# Patient Record
Sex: Female | Born: 1935
Health system: Southern US, Community
[De-identification: ages and names within clinical notes are randomized; demographics above are authoritative.]

## PROBLEM LIST (undated history)

## (undated) DIAGNOSIS — I10 Essential (primary) hypertension: Secondary | ICD-10-CM

## (undated) DIAGNOSIS — E119 Type 2 diabetes mellitus without complications: Secondary | ICD-10-CM

## (undated) DIAGNOSIS — M1711 Unilateral primary osteoarthritis, right knee: Secondary | ICD-10-CM

## (undated) DIAGNOSIS — K219 Gastro-esophageal reflux disease without esophagitis: Secondary | ICD-10-CM

## (undated) HISTORY — PX: BACK SURGERY: SHX140

## (undated) HISTORY — PX: JOINT REPLACEMENT: SHX530

## (undated) HISTORY — DX: Gastro-esophageal reflux disease without esophagitis: K21.9

## (undated) HISTORY — DX: Unilateral primary osteoarthritis, right knee: M17.11

## (undated) HISTORY — DX: Essential (primary) hypertension: I10

---

## 2005-01-19 LAB — HM COLONOSCOPY: HM Colonoscopy: NORMAL

## 2005-12-20 ENCOUNTER — Ambulatory Visit: Payer: Self-pay | Admitting: Gastroenterology

## 2007-02-05 ENCOUNTER — Emergency Department (HOSPITAL_COMMUNITY): Admission: EM | Admit: 2007-02-05 | Discharge: 2007-02-05 | Payer: Self-pay | Admitting: Emergency Medicine

## 2007-06-07 ENCOUNTER — Ambulatory Visit: Payer: Self-pay | Admitting: Family Medicine

## 2010-11-10 ENCOUNTER — Emergency Department: Payer: Self-pay | Admitting: Emergency Medicine

## 2010-12-17 ENCOUNTER — Ambulatory Visit: Payer: Self-pay | Admitting: Orthopedic Surgery

## 2010-12-27 ENCOUNTER — Emergency Department (HOSPITAL_COMMUNITY): Payer: MEDICARE

## 2010-12-27 ENCOUNTER — Encounter (HOSPITAL_COMMUNITY): Payer: Self-pay | Admitting: Radiology

## 2010-12-27 ENCOUNTER — Ambulatory Visit (HOSPITAL_COMMUNITY): Payer: MEDICARE

## 2010-12-27 ENCOUNTER — Emergency Department (HOSPITAL_COMMUNITY)
Admission: EM | Admit: 2010-12-27 | Discharge: 2010-12-27 | Disposition: A | Discharge status: Other Acute Inpatient Hospital | Payer: MEDICARE | Attending: Emergency Medicine | Admitting: Emergency Medicine

## 2010-12-27 ENCOUNTER — Inpatient Hospital Stay (HOSPITAL_COMMUNITY): Payer: Medicare Other

## 2010-12-27 ENCOUNTER — Inpatient Hospital Stay (HOSPITAL_COMMUNITY)
Admission: EM | Admit: 2010-12-27 | Discharge: 2010-12-30 | DRG: 471 | Disposition: A | Discharge status: Rehabilitation Facility | Payer: Medicare Other | Source: Ambulatory Visit | Attending: Neurological Surgery | Admitting: Neurological Surgery

## 2010-12-27 DIAGNOSIS — E119 Type 2 diabetes mellitus without complications: Secondary | ICD-10-CM | POA: Insufficient documentation

## 2010-12-27 DIAGNOSIS — Z79899 Other long term (current) drug therapy: Secondary | ICD-10-CM | POA: Insufficient documentation

## 2010-12-27 DIAGNOSIS — M5000 Cervical disc disorder with myelopathy, unspecified cervical region: Secondary | ICD-10-CM | POA: Diagnosis present

## 2010-12-27 DIAGNOSIS — I10 Essential (primary) hypertension: Secondary | ICD-10-CM | POA: Diagnosis present

## 2010-12-27 DIAGNOSIS — R5383 Other fatigue: Secondary | ICD-10-CM | POA: Insufficient documentation

## 2010-12-27 DIAGNOSIS — R209 Unspecified disturbances of skin sensation: Secondary | ICD-10-CM | POA: Insufficient documentation

## 2010-12-27 DIAGNOSIS — Y929 Unspecified place or not applicable: Secondary | ICD-10-CM | POA: Insufficient documentation

## 2010-12-27 DIAGNOSIS — M542 Cervicalgia: Secondary | ICD-10-CM | POA: Insufficient documentation

## 2010-12-27 DIAGNOSIS — M4712 Other spondylosis with myelopathy, cervical region: Principal | ICD-10-CM | POA: Diagnosis present

## 2010-12-27 DIAGNOSIS — R5381 Other malaise: Secondary | ICD-10-CM | POA: Insufficient documentation

## 2010-12-27 DIAGNOSIS — S13101A Dislocation of unspecified cervical vertebrae, initial encounter: Secondary | ICD-10-CM | POA: Insufficient documentation

## 2010-12-27 DIAGNOSIS — G8250 Quadriplegia, unspecified: Secondary | ICD-10-CM | POA: Diagnosis present

## 2010-12-27 DIAGNOSIS — W19XXXA Unspecified fall, initial encounter: Secondary | ICD-10-CM | POA: Insufficient documentation

## 2010-12-27 LAB — URINALYSIS, ROUTINE W REFLEX MICROSCOPIC
Bilirubin Urine: NEGATIVE
Nitrite: NEGATIVE
Specific Gravity, Urine: 1.011 (ref 1.005–1.030)
Urobilinogen, UA: 1 mg/dL (ref 0.0–1.0)

## 2010-12-27 LAB — POCT I-STAT, CHEM 8
Glucose, Bld: 122 mg/dL — ABNORMAL HIGH (ref 70–99)
HCT: 40 % (ref 36.0–46.0)
Hemoglobin: 13.6 g/dL (ref 12.0–15.0)
Potassium: 3.9 mEq/L (ref 3.5–5.1)
Sodium: 141 mEq/L (ref 135–145)

## 2010-12-27 LAB — GLUCOSE, CAPILLARY: Glucose-Capillary: 127 mg/dL — ABNORMAL HIGH (ref 70–99)

## 2010-12-28 ENCOUNTER — Inpatient Hospital Stay (HOSPITAL_COMMUNITY): Payer: Medicare Other

## 2010-12-28 HISTORY — PX: CERVICAL DISC SURGERY: SHX588

## 2010-12-28 LAB — URINE MICROSCOPIC-ADD ON

## 2010-12-28 LAB — APTT: aPTT: 32 seconds (ref 24–37)

## 2010-12-28 LAB — GLUCOSE, CAPILLARY
Glucose-Capillary: 142 mg/dL — ABNORMAL HIGH (ref 70–99)
Glucose-Capillary: 204 mg/dL — ABNORMAL HIGH (ref 70–99)

## 2010-12-28 LAB — URINALYSIS, ROUTINE W REFLEX MICROSCOPIC
Nitrite: NEGATIVE
Specific Gravity, Urine: 1.021 (ref 1.005–1.030)
Urobilinogen, UA: 1 mg/dL (ref 0.0–1.0)

## 2010-12-28 LAB — MRSA PCR SCREENING: MRSA by PCR: NEGATIVE

## 2010-12-29 DIAGNOSIS — M4712 Other spondylosis with myelopathy, cervical region: Secondary | ICD-10-CM

## 2010-12-29 LAB — GLUCOSE, CAPILLARY
Glucose-Capillary: 156 mg/dL — ABNORMAL HIGH (ref 70–99)
Glucose-Capillary: 161 mg/dL — ABNORMAL HIGH (ref 70–99)
Glucose-Capillary: 145 mg/dL — ABNORMAL HIGH (ref 70–99)

## 2010-12-30 ENCOUNTER — Inpatient Hospital Stay (HOSPITAL_COMMUNITY)
Admission: RE | Admit: 2010-12-30 | Discharge: 2011-01-08 | DRG: 945 | Disposition: A | Discharge status: Home Health | Payer: MEDICARE | Source: Other Acute Inpatient Hospital | Attending: Physical Medicine & Rehabilitation | Admitting: Physical Medicine & Rehabilitation

## 2010-12-30 DIAGNOSIS — Z981 Arthrodesis status: Secondary | ICD-10-CM

## 2010-12-30 DIAGNOSIS — B964 Proteus (mirabilis) (morganii) as the cause of diseases classified elsewhere: Secondary | ICD-10-CM | POA: Diagnosis present

## 2010-12-30 DIAGNOSIS — M4712 Other spondylosis with myelopathy, cervical region: Secondary | ICD-10-CM | POA: Diagnosis present

## 2010-12-30 DIAGNOSIS — E119 Type 2 diabetes mellitus without complications: Secondary | ICD-10-CM | POA: Diagnosis present

## 2010-12-30 DIAGNOSIS — Z5189 Encounter for other specified aftercare: Principal | ICD-10-CM

## 2010-12-30 DIAGNOSIS — M5000 Cervical disc disorder with myelopathy, unspecified cervical region: Secondary | ICD-10-CM | POA: Diagnosis present

## 2010-12-30 DIAGNOSIS — I10 Essential (primary) hypertension: Secondary | ICD-10-CM | POA: Diagnosis present

## 2010-12-30 DIAGNOSIS — N390 Urinary tract infection, site not specified: Secondary | ICD-10-CM | POA: Diagnosis present

## 2010-12-30 LAB — URINE CULTURE

## 2010-12-30 LAB — GLUCOSE, CAPILLARY: Glucose-Capillary: 179 mg/dL — ABNORMAL HIGH (ref 70–99)

## 2010-12-31 DIAGNOSIS — M1710 Unilateral primary osteoarthritis, unspecified knee: Secondary | ICD-10-CM

## 2010-12-31 DIAGNOSIS — M4712 Other spondylosis with myelopathy, cervical region: Secondary | ICD-10-CM

## 2010-12-31 LAB — COMPREHENSIVE METABOLIC PANEL
ALT: 19 U/L (ref 0–35)
Alkaline Phosphatase: 61 U/L (ref 39–117)
CO2: 22 mEq/L (ref 19–32)
Chloride: 108 mEq/L (ref 96–112)
GFR calc non Af Amer: >60 mL/min (ref >60)
Glucose, Bld: 126 mg/dL — ABNORMAL HIGH (ref 70–99)
Potassium: 4.1 mEq/L (ref 3.5–5.1)
Sodium: 138 mEq/L (ref 135–145)
Total Protein: 6.6 g/dL (ref 6.0–8.3)

## 2010-12-31 LAB — GLUCOSE, CAPILLARY
Glucose-Capillary: 120 mg/dL — ABNORMAL HIGH (ref 70–99)
Glucose-Capillary: 118 mg/dL — ABNORMAL HIGH (ref 70–99)

## 2010-12-31 LAB — DIFFERENTIAL
Basophils Absolute: 0 10*3/uL (ref 0.0–0.1)
Lymphocytes Relative: 46 % (ref 12–46)
Neutro Abs: 2.9 10*3/uL (ref 1.7–7.7)

## 2010-12-31 LAB — CBC
HCT: 35.8 % — ABNORMAL LOW (ref 36.0–46.0)
Hemoglobin: 11.7 g/dL — ABNORMAL LOW (ref 12.0–15.0)
RBC: 4.3 MIL/uL (ref 3.87–5.11)
WBC: 6.8 10*3/uL (ref 4.0–10.5)

## 2011-01-01 LAB — GLUCOSE, CAPILLARY
Glucose-Capillary: 125 mg/dL — ABNORMAL HIGH (ref 70–99)
Glucose-Capillary: 134 mg/dL — ABNORMAL HIGH (ref 70–99)
Glucose-Capillary: 103 mg/dL — ABNORMAL HIGH (ref 70–99)

## 2011-01-02 LAB — GLUCOSE, CAPILLARY
Glucose-Capillary: 79 mg/dL (ref 70–99)
Glucose-Capillary: 178 mg/dL — ABNORMAL HIGH (ref 70–99)

## 2011-01-02 NOTE — H&P (Addendum)
NAMEFRAIDY, MCCARRICK NO.:  [id number]  MEDICAL RECORD NO.:  [id number]           PATIENT TYPE:  I  LOCATION:  4035                         FACILITY:  [facility]  PHYSICIAN:  Ranelle Oyster, M.D.DATE OF BIRTH:  1932/05/13  DATE OF ADMISSION:  12/30/2010 DATE OF DISCHARGE:                             HISTORY & PHYSICAL   REASON FOR ADMISSION:  Weakness.  HISTORY:  A 77 year old female who was previously independent, had a fall in December 2011 with progressive weakness progressing to the arms and legs.  She was admitted on December 27, 2010 after an MRI of the C- spine showed a C3-4 stenosis and myelopathy due to spondylosis and herniated nucleus pulposus.  She underwent anterior cervical decompression and fusion at C3-4 level on December 28, 2010, per Dr. Barnett Abu.  She was placed in a soft collar postoperatively.  She was placed on a Decadron protocol, which was completed.  A urinalysis and culture showed greater than 100,000 Proteus mirabilis and placed on Cipro on December 29, 2010.  She was started in physical therapy and occupational therapy.  Had ongoing deficits and felt to be a good rehab candidate.  Therefore, PM and R was consulted and decision was made to admit the patient to inpatient rehabilitation.  REVIEW OF SYSTEMS:  Positive for weakness and numbness, numbness is more in the hand, weakness is in the hands as well as of the right greater than left lower extremity.  PAST HISTORY:  Diabetes and hypertension.  HABITS:  Negative EtOH, negative tobacco.  FAMILY HISTORY:  Positive for diabetes mellitus.  SOCIAL HISTORY:  Living alone until December 2011 until recent fall, then stays with her daughter and son-in-law who can assist as needed. One level home, two steps to enter.  FUNCTIONAL HISTORY:  Independent prior to December 2011 fall.  HOME MEDICATIONS: 1. Losartan 50 mg a day. 2. Metformin 500 mg b.i.d. 3. Tramadol 50 mg b.i.d. 4.  Meloxicam 7.5 daily. 5. Glipizide 5 mg daily.  ALLERGIES:  ASPIRIN.  RECENT LABORATORY DATA:  Hemoglobin of 13.6.  BUN 15, creatinine 0.7. Sodium 141, potassium 3.9.  PHYSICAL EXAMINATION:  VITAL SIGNS:  Blood pressure 152/81, pulse 56, respirations 20, temperature 97.4. GENERAL:  This is a well-developed and well-nourished female in no acute distress.  She has A cervical collar.  She has a healing incision.  No evidence of drainage, erythema.  Mild amount of soft tissue swelling. EYES:  Anicteric, noninjected. EXTERNAL ENT:  Normal. LUNGS:  Respiratory effort is good.  Lungs are clear. HEART:  Regular rate and rhythm.  No rubs, murmurs or extra sounds. ABDOMEN:  Positive bowel sounds, soft, nontender to palpation. NEUROLOGIC:  Gait has not tested given that there is no assistive device in the room.  She has reduced sitting balance.  She has dysmetria on finger-nose-finger testing as well as heel-to-shin testing bilaterally. Her motor strength is 4-/5 bilateral deltoid, biceps, triceps and 3- bilateral finger flexors, extensors.  In the lower extremities, she has 3-/5 strength in the right hip flexors, knee extensors and ankle dorsiflexor.  On the left side, it is 4/5 in these  same muscle groups. Sensation is diminished bilateral upper extremity below the distal to the wrist and intact in the lower extremities.  Deep tendon reflexes are hyperreflexic bilateral upper and lower extremities.  POST ADMISSION PHYSICIAN EVALUATION: 1. Functional deficits secondary to cervical spondylosis, stenosis     resulting in myelopathy after a fall, status post C3-4     decompression and fusion on postop day #2. 2. The patient was admitted to receive collaborative interdisciplinary     care between the podiatrist, rehab nursing staff, and therapy team. 3. The patient's level of medical complexity and substantial therapy     needs in context of that medical necessity cannot be provided at a      lesser intensity of care. 4. The patient has experienced substantial functional loss from her     baseline.  Upon functional assessment at the time of preadmission     screening, the patient had PT and OT evaluations pending.     Currently, the patient is at a min-assist level with mobility,     ambulating 15 feet with a rolling walker, min-assist.  She is min-     assist lower body dressing, min-to-guard upper body dressing.     Judging by the patient's diagnosis, physical exam, and functional     history, the patient had displayed the ability to make functional     progress which will result in measurable gains while on inpatient     rehab.  These gains will be of substantial and practical use upon     discharge to home in facilitating mobility, self-care and     independence.  Interim changes in medical status since preadmission     screening are detailed in the history of present illness. 5. Physiatrist will provide 24-hour management of medical needs as     well as oversight of the therapy plan/treatment and provide     guidance as appropriate regarding the interaction of the two. 6. A 24-hour rehab nursing will assess in management of the skin,     bowel and bladder and help to integrate therapy, concepts,     techniques, education. 7. PT will assess and treat for pre-gait training, gait training,     endurance, equipment, safety, goals are for modified independent     level with all mobility. 8. OT will assess and treat for ADLs, neuromuscular reeducation,     safety endurance, equipment, goals are for modified independent     level with upper body and lower body ADLs. 9. Case management and social work will assess and treat for     psychosocial issues and discharge planning. 10.Team conference will be held weekly to assess the patient     progress/goals and to determine barriers to discharge. 11.The patient demonstrated sufficient medical stability and exercise     capacity to  tolerate at least 3 hours of therapy per day at least 5     days per week. 12.Estimated length of stay is 7-10 days.  Prognosis for further     functional improvement is good.  MEDICAL PROBLEM LIST AND PLAN: 1. Urinary tract infection.  Continue Cipro. 2. Question neurogenic bladder.  We will check bladder volume     indicator as postvoid residual. 3. Hypertension.  Continue Cozaar. 4. Noninsulin-dependent diabetes mellitus.  Continue Glucophage 500     b.i.d. 5. Pain management, on Vicodin. 6. Deep venous thrombosis prophylax, on thigh high TEDs and mobilize     aggressively. 7. Question neurogenic  bowel.  We will monitor and may need to     institute bowel program.  Discussed Rehab Medicine Services with     the patient and questions were answered.  The patient appears to have adequate motivation and mood for full participation in comprehensive intensive inpatient rehabilitation program.     Erick Colace, M.D.   ______________________________ Ranelle Oyster, M.D.    AEK/MEDQ  D:  12/30/2010  T:  12/31/2010  Job:  [id number]  cc:   Dr. Halina Maidens, M.D.  Electronically Signed by Claudette Laws M.D. on 12/31/2010 04:29:36 PM Electronically Signed by Faith Rogue M.D. on 01/05/2011 10:06:53 AM

## 2011-01-03 LAB — GLUCOSE, CAPILLARY
Glucose-Capillary: 107 mg/dL — ABNORMAL HIGH (ref 70–99)
Glucose-Capillary: 121 mg/dL — ABNORMAL HIGH (ref 70–99)

## 2011-01-04 LAB — GLUCOSE, CAPILLARY: Glucose-Capillary: 145 mg/dL — ABNORMAL HIGH (ref 70–99)

## 2011-01-05 DIAGNOSIS — M4712 Other spondylosis with myelopathy, cervical region: Secondary | ICD-10-CM

## 2011-01-05 LAB — GLUCOSE, CAPILLARY
Glucose-Capillary: 123 mg/dL — ABNORMAL HIGH (ref 70–99)
Glucose-Capillary: 149 mg/dL — ABNORMAL HIGH (ref 70–99)

## 2011-01-06 DIAGNOSIS — M4712 Other spondylosis with myelopathy, cervical region: Secondary | ICD-10-CM

## 2011-01-06 LAB — GLUCOSE, CAPILLARY: Glucose-Capillary: 83 mg/dL (ref 70–99)

## 2011-01-07 LAB — GLUCOSE, CAPILLARY
Glucose-Capillary: 51 mg/dL — ABNORMAL LOW (ref 70–99)
Glucose-Capillary: 90 mg/dL (ref 70–99)

## 2011-01-08 LAB — GLUCOSE, CAPILLARY: Glucose-Capillary: 124 mg/dL — ABNORMAL HIGH (ref 70–99)

## 2011-01-13 NOTE — H&P (Signed)
NAMEAIKO, BELKO               ACCOUNT NO.:  [id number]  MEDICAL RECORD NO.:  [id number]           PATIENT TYPE:  I  LOCATION:  3008                         FACILITY:  [facility]  PHYSICIAN:  Stefani Dama, M.D.  DATE OF BIRTH:  23-Jul-1932  DATE OF ADMISSION:  12/27/2010 DATE OF DISCHARGE:                             HISTORY & PHYSICAL   ADMITTING DIAGNOSES:  C3-C4 spondylosis with herniated nucleus pulposus, cord compression, myelopathy, and quadriparesis.  HISTORY OF PRESENT ILLNESS:  Ms. Ranie Chinchilla is a 77 year old right- handed individual who tells me that she sustained a fall around Christmas Day this past month.  She complained of feeling weak in her legs and had numbness in her knee.  She was seen in the [facility] Emergency Room, taken there by her daughter and she was told that she had arthritis in her knees but the numbness in her hands was not specifically addressed.  The patient notes that this problem continued and for about the past 2 weeks she has become so severely weak in her legs that she has not been able to walk.  She has been so weak in her hands that she cannot even feed herself.  Her daughter had brought her mother from [place] where __________ [place] with the daughter lives, so she could better care for her.  She notes that she has required use of Depends.  She cannot get up to go the bathroom.  Today she is brought to the emergency room for further workup and in the emergency room, a CT scan of the brain was performed.  An MRI of the cervical spine was performed and this demonstrates presence of a large cervical herniated nucleus pulposus and on top of it significantly spondylitic spinal canal.  The patient has evidence of cord changes within the spinal canal.  Her past medical history reveals her general health has been quite fair. She does have an underlying history of diabetes.  Her primary care physician is in [place].  She is currently  managed with some losartan 50 mg a day, Mobic for arthritis, metformin 500 mg twice daily, glipizide 5 mg a day, and Ultram for pain.  She does not notice any allergies.  She is followed by a local physician in the [facility] region.  Her social history reveals that she lives alone.  Her physical examination reveals that she is alert and oriented.  Her blood pressure on initial examination is noted to 180/81, heart rate 98, respirations are 18.  She is alert and oriented.  She complains of pain and weakness in her upper extremities.  She has proximal strength in the deltoids, biceps, and triceps all graded at 3/5.  Wrist extensor strength is also graded at 3/5 and grip strength is only graded at 3/5. She has difficulty with fine manipulative control of the hand.  She had marked intrinsic weakness bilaterally.  She has only 3/5 strength in iliopsoas, quad, tibialis anterior, and the gastrocs and lower extremities.  She has great deal of difficulty lifting against gravity with her quadriceps to extend either distal lower extremity.  She has good sensation present  to light pin and touch in the distal lower extremities.  She has some moderate decrease in sensation in hands.  She is somewhat hyperpathic in the arms and particularly in the forearms. General physical exam reveals her heart has regular rate and rhythm.  No murmurs are noted.  Lungs are clear to auscultation.  The abdomen is soft.  Bowel sounds are positive.  No masses are noted.  Extremities reveal no cyanosis, clubbing, or edema.  Deep tendon reflexes are absent in the patellae and the Achilles both.  They are trace in the biceps, absent in the triceps.  IMPRESSION:  The patient has evidence of severe spondylosis at C3-C4 with a central herniation of the disk causing cord compression.  She is now severely myelopathic.  I advised that the patient be started on steroid medication.  I had discussed surgical intervention and  decompressed C3- C4.  I noted that the patient will be transferred over to [facility].  This will hopefully happen this evening, so that we see her and follow her up while at [facility].     Stefani Dama, M.D.     Merla Riches  D:  12/27/2010  T:  12/28/2010  Job:  [id number]  Electronically Signed by Barnett Abu M.D. on 01/13/2011 08:03:01 AM

## 2011-01-13 NOTE — Op Note (Signed)
[name], [name]               ACCOUNT NO.:  [id number]  MEDICAL RECORD NO.:  [id number]           PATIENT TYPE:  I  LOCATION:  3008                         FACILITY:  [facility]  PHYSICIAN:  Stefani Dama, M.D.  DATE OF BIRTH:  08/11/1932  DATE OF PROCEDURE:  12/28/2010 DATE OF DISCHARGE:                              OPERATIVE REPORT   PREOPERATIVE DIAGNOSES:  C3, C4 spondylosis plus herniated nucleus pulposus with quadriparesis.  POSTOPERATIVE DIAGNOSES:  C3, C4 spondylosis plus herniated nucleus pulposus with quadriparesis.  PROCEDURES: 1. Anterior cervical decompression, C3-C4. 2. Arthrodesis with structural allograft, Alphatec plate fixation.  SURGEON:  Stefani Dama, MD  FIRST ASSISTANT:  Cristi Loron, MD  ANESTHESIA:  General endotracheal.  INDICATIONS:  [name] is a 77 year old individual who has had significant problems with quadriparesis that has been quite severe such that she could not feed herself nor could she walk independently.  This has been going on for several weeks' time.  She came to the emergency room where an MRI of the neck was performed and this demonstrated presence of a large herniated nucleus pulposus on top of a significantly spondylitic neck at C3-C4 causing cord compression with edema.  She has been advised regarding need for urgent surgical decompression.  PROCEDURE IN DETAIL:  The patient was brought to the operating room and placed on the table in supine position.  After smooth induction of general endotracheal anesthesia, her neck was placed in 5 pounds of Holter traction and was prepped with alcohol and DuraPrep and draped in sterile fashion.  A transverse incision was created and carried down to the cervical dorsal fascia in the plane between the sternocleidomastoid and strap muscle was dissected bluntly until the prevertebral space was reached.  The first identifiable disk space was noted to be that of C3- C4.   Self-retaining Caspar retractors were then placed in the wound and the disk space was opened with #15 blade.  A combination of Kerrison rongeurs was used to evacuate a significant quantity of severely degenerating and desiccated disk material.  In the region of posterior longitudinal ligament, there was noted to be free disk material bowing the posterior longitudinal ligament back into the spinal canal.  This was decompressed and then the posterior ligament was opened using a 2-mm Kerrison punch.  The opening was carried out to either lateral recess. Lateral recesses were decompressed using a high-speed drill to remove the bony osteophytes from the uncinate processes, and then a 2-mm Kerrison punch was used to remove osteophytosis from the inferior margin of body of C3 and the superior margin body of C4.  Once decompression was completed, hemostasis was achieved meticulously and then a 7-mm trans graft was shaped to the appropriate size and configuration fit in the interspace and filled with demineralized bone matrix.  Traction was removed at this point and the lateral aspects of the graft were filled with graft material also.  A 14-mm standard size Alphatec plate was fitted in the ventral aspect of the vertebral bodies. This was secured with 12-mm screws.  Final radiograph identified good fixation and good position of the  hardware, and then the platysma was closed with 3-0 Vicryl and the subcuticular tissue was closed with 3-0 Vicryl also.  Blood loss was minimal.  The patient tolerated the procedure well.     Stefani Dama, M.D.     Merla Riches  D:  12/28/2010  T:  12/29/2010  Job:  [id number]  Electronically Signed by Barnett Abu M.D. on 01/13/2011 08:03:24 AM

## 2011-01-13 NOTE — Consult Note (Signed)
  NAMETAVIE, HASEMAN NO.:  [id number]  MEDICAL RECORD NO.:  [id number]           PATIENT TYPE:  I  LOCATION:  3008                         FACILITY:  [facility]  PHYSICIAN:  Stefani Dama, M.D.  DATE OF BIRTH:  [birth year]  DATE OF CONSULTATION:  12/27/2010 DATE OF DISCHARGE:                                CONSULTATION   REFERRING PHYSICIAN:  Orlene Och, MD  REASON FOR REQUEST:  Quadriparesis.  HISTORY OF PRESENT ILLNESS:  [name] is a 77 year old lady who has been getting progressively weaker for at least in the last 4 weeks' time and perhaps more severely over the last 2 weeks' time.  She is noted to be quadriparetic, having 3/5 strength in her arms and her legs. An MRI today demonstrated presence of severe spondylosis at C3-C4 with cord compression secondary to a central disk herniation.  After seeing and evaluating her briefly, I advised that she will definitely need surgical decompression because of the severe recent deterioration in light of the fact that it has been at least 2 weeks.  As the patient has been substantially weak, I indicated that there is no guarantee that she would get much improvement if any with surgical intervention. Nonetheless, at this time, this is a dire situation and I believe that surgical decompression should be considered.  She is to be transferred to [facility] at this time for definitive surgical care.     Stefani Dama, M.D.     Merla Riches  D:  12/27/2010  T:  12/28/2010  Job:  [id number]  Electronically Signed by Barnett Abu M.D. on 01/13/2011 08:03:10 AM

## 2011-02-02 NOTE — Discharge Summary (Signed)
[name], [name]               ACCOUNT NO.:  [id number]  MEDICAL RECORD NO.:  [id number]           PATIENT TYPE:  I  LOCATION:  4035                         FACILITY:  [facility]  PHYSICIAN:  Ranelle Oyster, M.D.DATE OF BIRTH:  1932/08/02  DATE OF ADMISSION:  12/30/2010 DATE OF DISCHARGE:  01/08/2011                              DISCHARGE SUMMARY   DISCHARGE DIAGNOSES: 1. Cervical spondylosis stenosis with myelopathy status post cervical     3-4 decompression and fusion on December 28, 2010. 2. Pain management. 3. Hypertension. 4. Non-insulin-dependent diabetes mellitus. 5. Proteus urinary tract infection.  This is a 77 year old right-handed black female with history of fall in December 2011, with progressive weakness to bilateral arms and legs, admitted on December 27, 2010, with MRI of cervical spine showing cervical 3-4 spondylosis stenosis with myelopathy plus HNP.  Underwent anterior cervical decompression and fusion, cervical 3-4 on December 28, 2010, per Dr. Danielle Dess.  Soft collar placed as needed for comfort. Decadron taper completed.  She was placed on Cipro on December 29, 2010, for a Proteus urinary tract infection.  She is minimal assist for mobility.  She was admitted for comprehensive rehab program.  PAST MEDICAL HISTORY:  See discharge diagnoses.  No alcohol or tobacco.  ALLERGIES:  ASPIRIN.  SOCIAL HISTORY:  Had been living alone until December 2011, until recent falls, now staying with her daughter and son-in-law with assistance as needed.  They live in a 1-level home with 2 steps to entry.  Functional history prior to admission was independent prior to December 2011. Functional status upon admission to rehab services was minimal assist bed mobility and transfers, minimal assist to ambulate with a rolling walker, minimal assist activities of daily living.  MEDICATIONS PRIOR TO ADMISSION: 1. Losartan 50 mg daily. 2. Glucophage 500 mg twice daily. 3.  Tramadol as needed. 4. Meloxicam 7.5 mg daily. 5. Glipizide 5 mg daily.  PHYSICAL EXAMINATION:  VITAL SIGNS:  Blood pressure 152/81, pulse 56, temperature 97.4, respirations 20. NEUROLOGIC:  This was an alert female in no acute distress, oriented x3. Deep tendon reflexes 2+.  She was mildly ataxic.  Sensation decreased at right hand.  Surgical site clean and dry. LUNGS:  Clear to auscultation. CARDIAC:  Regular rate and rhythm. ABDOMEN:  Soft, nontender.  Good bowel sounds.  REHABILITATION HOSPITAL COURSE:  The patient was admitted to inpatient rehab services with therapies initiated on a 3-hour daily basis consisting of physical therapy, occupational therapy, and rehabilitation nursing.  The following issues were addressed during the patient's rehabilitation stay.  Pertaining to Ms. Shabazz's cervical spondylosis stenosis with myelopathy, she had undergone decompression and fusion on December 28, 2010, per Dr. Barnett Abu.  She was wearing a cervical collar for comfort.  She was maintained on Vicodin for pain management with good results.  She had been fitted with a hinged left knee brace to stabilize her knee that would buccal at times per [facility]. Her blood pressures remained controlled on Cozaar with no orthostatic changes.  Blood sugars with good control on Glucophage.  She had completed a course of Cipro for a Proteus urinary tract  infection.  She had no dysuria or hematuria.  The patient received weekly collaborative interdisciplinary team conferences to discuss estimated length of stay, family teaching, and any barriers to her discharge.  She was ambulating household functional distances with a walker, needing very little assist for activities of daily living.  Strength and endurance had greatly improved.  She was encouraged for overall progress.  Plans will be discharge to home with assistance as needed.  Latest labs showed a hemoglobin 11.7, hematocrit 35.8,  platelet 277,000. Sodium 138, potassium 4.1, BUN 22, creatinine 0.7.  Discharge medications at the time of dictation included: 1. Pepcid 20 mg twice daily. 2. Glipizide 5 mg daily. 3. Cozaar 50 mg daily. 4. Glucophage 500 mg twice daily. 5. Vicodin 5/325 one to two tablets every 4 hours as needed for pain,     dispense of 90 tablets.  DIET:  Diabetic diet.  SPECIAL INSTRUCTIONS:  Soft cervical collar as needed for comfort, left hinged knee brace when out of bed to ambulate to guard against her knee buckling.  Follow up Dr. Elizabeth Sauer, medical management in 2 weeks; Dr. Barnett Abu, [phone], Neurosurgery, call for appointment; Dr. Faith Rogue at the outpatient rehab service office as needed.     Mariam Dollar, P.A.   ______________________________ Ranelle Oyster, M.D.    DA/MEDQ  D:  01/06/2011  T:  01/06/2011  Job:  [id number]  cc:   Dr. Halina Maidens, M.D.  Electronically Signed by Mariam Dollar P.A. on 01/10/2011 06:21:29 AM Electronically Signed by Faith Rogue M.D. on 02/02/2011 10:20:29 AM

## 2011-03-07 NOTE — Discharge Summary (Signed)
  NAMEAMRIE, [name]:  [id number]  MEDICAL RECORD NO.:  [id number]           PATIENT TYPE:  LOCATION:                                 FACILITY:  PHYSICIAN:  Stefani Dama, M.D.  DATE OF BIRTH:  1932-08-26  DATE OF ADMISSION:  12/28/2010 DATE OF DISCHARGE:  12/30/2010                              DISCHARGE SUMMARY   ADMITTING DIAGNOSES:  C3-4 spondylosis, herniated nucleus pulposus with quadriparesis and cord compression.  DISCHARGE DIAGNOSIS:  C3-4 spondylosis, herniated nucleus pulposus with quadriparesis and cord compression.  OPERATION AND PROCEDURE:  Anterior cervical decompression and fusion at C3-4 with arthrodesis structural allograft and Arthrotek plate fixation.  HISTORY AND HOSPITAL COURSE:  [name] is a 77 year old female who has significant problems with quadriparesis, has been quite severe.  She cannot feed herself or walk independently.  This has been going on for several weeks time.  She came to the emergency room.  MRI resulted in a finding of a large herniated disk at the C3-4 level and a spondylitic neck causing cord compression and edema.  Advise on urgent surgical decompression.  The patient tolerated this procedure well.  On December 28, 2010, postoperatively, she was eating well, improved in her pain level.  She still had dysesthesias and weakness, requiring some rehabilitation.  Comprehensive inpatient rehab consult was obtained and felt she would be a good candidate.  She made good progress, comfortable on p.o. pain medicine, was ready for discharge to comprehensive inpatient rehabilitation.  Second day postoperatively, on December 30, 2010, eating well, voiding well, needing assistance, and rehab from occupational therapy and physical therapy standpoint without swallowing difficulties.  DISCHARGE CONDITION:  Stable, improved.  DISCHARGE INSTRUCTIONS:  Discharged to comprehensive inpatient rehabilitation.  Continue on  home medications of tramadol, glipizide, metformin, losartan, and hold on meloxicam.  She is to take Percocet and/or Valium for pain control.  She will be followed as an inpatient and as an outpatient per routine after she is discharged, more hopeful that she will get a good result after comprehensive inpatient rehabilitation get it to the point where she can feed' herself independently and ambulate safely.  All questions encouraged and stressed.  The patient is discharged to comprehensive inpatient rehab. Condition on discharge, improved.     [name] Medico.   ______________________________ Stefani Dama, M.D.    SCI/MEDQ  D:  02/03/2011  T:  02/04/2011  Job:  [id number]  Electronically Signed by Orlin Hilding P.A. on 02/10/2011 09:11:43 AM Electronically Signed by Barnett Abu M.D. on 03/07/2011 10:09:30 AM

## 2011-03-22 ENCOUNTER — Encounter (HOSPITAL_COMMUNITY)
Admission: RE | Admit: 2011-03-22 | Discharge: 2011-03-22 | Disposition: A | Discharge status: Home / Self Care | Payer: Medicare Other | Source: Ambulatory Visit | Attending: Orthopedic Surgery | Admitting: Orthopedic Surgery

## 2011-03-22 LAB — CBC
Hemoglobin: 12.4 g/dL (ref 12.0–15.0)
MCH: 28.8 pg (ref 26.0–34.0)
MCHC: 33.4 g/dL (ref 30.0–36.0)
RDW: 13.5 % (ref 11.5–15.5)

## 2011-03-22 LAB — COMPREHENSIVE METABOLIC PANEL
AST: 16 U/L (ref 0–37)
Albumin: 3.4 g/dL — ABNORMAL LOW (ref 3.5–5.2)
Calcium: 10 mg/dL (ref 8.4–10.5)
Chloride: 103 mEq/L (ref 96–112)
Creatinine, Ser: 0.66 mg/dL (ref 0.4–1.2)
GFR calc Af Amer: >60 mL/min (ref >60)

## 2011-03-22 LAB — PROTIME-INR: Prothrombin Time: 13.4 seconds (ref 11.6–15.2)

## 2011-03-22 LAB — URINE MICROSCOPIC-ADD ON

## 2011-03-22 LAB — URINALYSIS, ROUTINE W REFLEX MICROSCOPIC
Glucose, UA: NEGATIVE mg/dL
Hgb urine dipstick: NEGATIVE
Protein, ur: NEGATIVE mg/dL

## 2011-03-22 LAB — DIFFERENTIAL
Basophils Relative: 1 % (ref 0–1)
Eosinophils Absolute: 0.2 10*3/uL (ref 0.0–0.7)
Eosinophils Relative: 3 % (ref 0–5)
Monocytes Absolute: 0.6 10*3/uL (ref 0.1–1.0)
Monocytes Relative: 8 % (ref 3–12)
Neutro Abs: 3.6 10*3/uL (ref 1.7–7.7)

## 2011-03-23 LAB — URINE CULTURE: Colony Count: 50000

## 2011-03-28 ENCOUNTER — Inpatient Hospital Stay (HOSPITAL_COMMUNITY)
Admission: RE | Admit: 2011-03-28 | Discharge: 2011-03-30 | DRG: 470 | Disposition: A | Discharge status: Home Health | Payer: Medicare Other | Source: Ambulatory Visit | Attending: Orthopedic Surgery | Admitting: Orthopedic Surgery

## 2011-03-28 DIAGNOSIS — K5900 Constipation, unspecified: Secondary | ICD-10-CM | POA: Diagnosis not present

## 2011-03-28 DIAGNOSIS — I10 Essential (primary) hypertension: Secondary | ICD-10-CM | POA: Diagnosis present

## 2011-03-28 DIAGNOSIS — D62 Acute posthemorrhagic anemia: Secondary | ICD-10-CM | POA: Diagnosis not present

## 2011-03-28 DIAGNOSIS — M129 Arthropathy, unspecified: Secondary | ICD-10-CM | POA: Diagnosis present

## 2011-03-28 DIAGNOSIS — E119 Type 2 diabetes mellitus without complications: Secondary | ICD-10-CM | POA: Diagnosis present

## 2011-03-28 DIAGNOSIS — M1710 Unilateral primary osteoarthritis, unspecified knee: Principal | ICD-10-CM | POA: Diagnosis present

## 2011-03-28 DIAGNOSIS — IMO0002 Reserved for concepts with insufficient information to code with codable children: Principal | ICD-10-CM | POA: Diagnosis present

## 2011-03-28 DIAGNOSIS — Z01812 Encounter for preprocedural laboratory examination: Secondary | ICD-10-CM

## 2011-03-28 HISTORY — PX: TOTAL KNEE ARTHROPLASTY: SHX125

## 2011-03-28 LAB — GLUCOSE, CAPILLARY
Glucose-Capillary: 126 mg/dL — ABNORMAL HIGH (ref 70–99)
Glucose-Capillary: 217 mg/dL — ABNORMAL HIGH (ref 70–99)
Glucose-Capillary: 166 mg/dL — ABNORMAL HIGH (ref 70–99)

## 2011-03-28 LAB — TYPE AND SCREEN
ABO/RH(D): O POS
Antibody Screen: NEGATIVE

## 2011-03-29 LAB — GLUCOSE, CAPILLARY
Glucose-Capillary: 134 mg/dL — ABNORMAL HIGH (ref 70–99)
Glucose-Capillary: 213 mg/dL — ABNORMAL HIGH (ref 70–99)
Glucose-Capillary: 171 mg/dL — ABNORMAL HIGH (ref 70–99)

## 2011-03-29 LAB — BASIC METABOLIC PANEL
Chloride: 102 mEq/L (ref 96–112)
Creatinine, Ser: 0.6 mg/dL (ref 0.4–1.2)
GFR calc Af Amer: >60 mL/min (ref >60)
GFR calc non Af Amer: >60 mL/min (ref >60)
Potassium: 4.2 mEq/L (ref 3.5–5.1)

## 2011-03-29 LAB — CBC
MCH: 29.1 pg (ref 26.0–34.0)
Platelets: 226 10*3/uL (ref 150–400)
RBC: 3.3 MIL/uL — ABNORMAL LOW (ref 3.87–5.11)
WBC: 8.1 10*3/uL (ref 4.0–10.5)

## 2011-03-30 LAB — BASIC METABOLIC PANEL
BUN: 9 mg/dL (ref 6–23)
Chloride: 100 mEq/L (ref 96–112)
Potassium: 4.4 mEq/L (ref 3.5–5.1)

## 2011-03-30 LAB — CBC
MCV: 85.5 fL (ref 78.0–100.0)
Platelets: 189 10*3/uL (ref 150–400)
RBC: 3.3 MIL/uL — ABNORMAL LOW (ref 3.87–5.11)
WBC: 9 10*3/uL (ref 4.0–10.5)

## 2011-03-30 LAB — GLUCOSE, CAPILLARY
Glucose-Capillary: 153 mg/dL — ABNORMAL HIGH (ref 70–99)
Glucose-Capillary: 127 mg/dL — ABNORMAL HIGH (ref 70–99)

## 2011-04-04 ENCOUNTER — Ambulatory Visit (HOSPITAL_COMMUNITY)
Admission: RE | Admit: 2011-04-04 | Discharge: 2011-04-04 | Disposition: A | Discharge status: Home / Self Care | Payer: Medicare Other | Source: Ambulatory Visit | Attending: Orthopedic Surgery | Admitting: Orthopedic Surgery

## 2011-04-04 DIAGNOSIS — M79609 Pain in unspecified limb: Secondary | ICD-10-CM | POA: Insufficient documentation

## 2011-04-04 DIAGNOSIS — M7989 Other specified soft tissue disorders: Secondary | ICD-10-CM | POA: Insufficient documentation

## 2011-04-12 NOTE — Op Note (Signed)
[name], [name]               ACCOUNT NO.:  [id number]  MEDICAL RECORD NO.:  [id number]           PATIENT TYPE:  I  LOCATION:  5009                         FACILITY:  [facility]  PHYSICIAN:  Azana Kiesler A. Thurston Hole, M.D. DATE OF BIRTH:  04/15/1932  DATE OF PROCEDURE:  03/28/2011 DATE OF DISCHARGE:                              OPERATIVE REPORT   PREOPERATIVE DIAGNOSIS:  Left knee degenerative joint disease.  POSTOPERATIVE DIAGNOSIS:  Left knee degenerative joint disease.  PROCEDURE:  Left total knee replacement using DePuy cemented total knee system with #3 cemented femur, #3 cemented tibia with 12.5-mm polyethylene RP tibial spacer and 32-mm polyethylene cemented patella.  SURGEON:  Elana Alm. Thurston Hole, MD  ASSISTANT:  Julien Girt, PA-C  ANESTHESIA:  General.  OPERATIVE TIME:  One hour and 30 minutes.  COMPLICATIONS:  None.  DESCRIPTION OF PROCEDURE:  [name] was brought to the operating room on Mar 28, 2011, placed on the operating table in supine position.  After an adequate level of general anesthesia was obtained, her left knee was examined.  Range of motion from minus 10-120 degrees, moderate varus deformity, knee stable ligamentous exam with normal patellar tracking. She had a Foley catheter placed under sterile conditions.  She received Ancef 2 g IV preoperatively for prophylaxis.  Left leg was then prepped using sterile DuraPrep and draped using sterile technique.  Time-out procedure was called, and the correct left knee identified.  Left leg was exsanguinated and then a thigh tourniquet elevated to 375 mm. Initially through a 15-cm longitudinal incision based over the patella, initial exposure was made.  The underlying subcutaneous tissues were incised along with skin incision.  A median arthrotomy was performed revealing an excessive amount of normal-appearing joint fluid.  The articular surfaces were inspected.  She had grade 4 changes medially, laterally, and  in the patellofemoral joint.  Osteophytes were removed from the femoral condyles and tibial plateau.  The medial and lateral meniscal remnants were removed as well as the anterior cruciate ligament.  Intramedullary drill was then drilled up the femoral canal for placement of proximal femoral cutting jig which was placed in the appropriate manner of rotation and a proximal 13-mm cut was made.  The distal femur was incised.  The #3 was found to be the appropriate size. The #3 cutting jig was placed in the appropriate manner of external rotation and then these cuts were made.  Proximal tibia was then exposed.  The tibial spines were removed with an oscillating saw. Intramedullary drill was drilled down the tibial canal for placement of the proximal tibial cutting jig which was placed in the appropriate manner of rotation and a proximal 4-mm cut was made based off the medial or lower side.  Spacer blocks were then placed in flexion and extension. The 12.5-mm blocks gave excellent balancing and excellent stability and excellent correction of her flexion and varus deformities.  The #3 tibial baseplate trial was then placed on the cut tibial surface with an excellent fit and the keel cut was made.  The PCL box cutter was then placed on the distal femur and these cuts were made.  At this point, a #3 femoral trial was placed, and with the #3 tibial base plate trial and a 12.5-mm polyethylene RP tibial spacer, knee was reduced, taken through a range of motion from 0-125 degrees with excellent stability and excellent correction of her flexion and varus deformities and normal patellar tracking.  A resurfacing 8-mm cut was then made on the patella and three locking holes placed for a 32-mm polyethylene patellar trial. After the trial was placed, again patellofemoral tracking was evaluated and found to be normal.  At this point, it was felt that all the trial components were of excellent size, fit, and  stability.  They were then removed.  The knee was then jet lavage irrigated with 3 L of saline. The proximal tibia was then exposed and the #3 tibial baseplate with Zinacef-impregnated cement was then hammered into position with an excellent fit with excess cement being removed from around the edges. The #3 femoral component with cement backing was also hammered into position also with an excellent fit with excess cement being removed from around the edges.  The 12.5-mm polyethylene RP tibial spacer was placed on the tibial baseplate, the knee reduced, taken through a range of motion from 0-125 degrees with excellent stability and excellent correction of her flexion and varus deformities.  The 32-mm polyethylene cement-backed patella was then placed in its position and held there with a clamp.  After the cement hardened, again patellofemoral tracking was evaluated and found to be normal.  At this point, it was felt that all the components were of excellent size, fit, and stability.  The wound was further irrigated with saline.  Tourniquet was released. Hemostasis obtained with cautery.  The arthrotomy was then closed with #1 Ethibond suture over two medium Hemovac drains.  Subcutaneous tissue was closed with 0 and 2-0 Vicryl, subcuticular layer closed with 4-0 Monocryl.  Sterile dressings and long-leg splint applied.  The patient then awakened, extubated, and taken to recovery room in stable condition.  Needle and sponge count was correct x2 at the end of the case.  Neurovascular status normal.  Pulses 2+ and symmetric.     [name] A. Thurston Hole, M.D.     RAW/MEDQ  D:  03/28/2011  T:  03/29/2011  Job:  [id number]  Electronically Signed by Salvatore Marvel M.D. on 04/12/2011 05:25:15 PM

## 2013-08-08 ENCOUNTER — Encounter: Payer: Self-pay | Admitting: Physician Assistant

## 2013-08-08 ENCOUNTER — Other Ambulatory Visit: Payer: Self-pay | Admitting: Physician Assistant

## 2013-08-08 NOTE — H&P (Signed)
TOTAL KNEE ADMISSION H&P  Patient is being admitted for right total knee arthroplasty.  Subjective:  Chief Complaint:right knee pain.  HPI: [name], 79 y.o. female, has a history of pain and functional disability in the right knee due to arthritis and has failed non-surgical conservative treatments for greater than 12 weeks to includeNSAID's and/or analgesics, corticosteriod injections, viscosupplementation injections, flexibility and strengthening excercises, supervised PT with diminished ADL's post treatment, use of assistive devices and activity modification.  Onset of symptoms was gradual, starting 8 years ago with gradually worsening course since that time. The patient noted no past surgery on the right knee(s).  Patient currently rates pain in the right knee(s) at 10 out of 10 with activity. Patient has night pain, worsening of pain with activity and weight bearing, pain that interferes with activities of daily living, crepitus and joint swelling.  Patient has evidence of subchondral sclerosis, periarticular osteophytes and joint space narrowing by imaging studies. There is no active infection.  There are no active problems to display for this patient.  Past Medical History  Diagnosis Date  . Diabetes   . Hypertension   . GERD (gastroesophageal reflux disease)   . Status post total left knee replacement 2012    Dr Wainer  . Right knee DJD     Past Surgical History  Procedure Laterality Date  . Total knee arthroplasty Left 03/28/2011    Dr Wainer  . Neck surgery  12/28/2010    Dr Elsner     (Not in a hospital admission) No Known Allergies  History  Substance Use Topics  . Smoking status: Never Smoker   . Smokeless tobacco: Not on file  . Alcohol Use: No    Family History  Problem Relation Age of Onset  . Diabetes Mother      Review of Systems  Constitutional: Negative.   Eyes: Negative.   Respiratory: Negative.   Cardiovascular: Negative.   Gastrointestinal:  Negative.   Genitourinary: Negative.   Musculoskeletal: Positive for back pain and joint pain.  Skin: Negative.   Neurological: Negative.   Endo/Heme/Allergies: Negative.   Psychiatric/Behavioral: Negative.     Objective:  Physical Exam  Constitutional: She appears well-developed and well-nourished.  HENT:  Head: Normocephalic and atraumatic.  Eyes: Conjunctivae are normal. Pupils are equal, round, and reactive to light.  Neck: Normal range of motion. Neck supple.  Cardiovascular: Normal rate and regular rhythm.   Murmur heard. Respiratory: Effort normal and breath sounds normal.  GI: Soft. Bowel sounds are normal.  Genitourinary:  Not pertinent to current symptomatology therefore not examined.  Musculoskeletal:  Examination of her right knee reveals moderate varus deformity. 1 to 2+ crepitation 1+ synovitis range of motion from-10 to 95 degrees knee is stable with diffuse pain and normal patella tracking. Exam of her left knee reveals the incision is well healed. No w or pain range of motion 0-115 degrees knee is stable with normal patella tracking. Vascular exam: pulses 2+ and symmetric.  VERY DECONDITIONED  Neurological: She is alert.  Skin: Skin is warm and dry.  Psychiatric: She has a normal mood and affect. Her behavior is normal.    Vital signs in last 24 hours: @VSRANGES@  Labs:   Estimated body mass index is 33.28 kg/(m^2) as calculated from the following:   Height as of this encounter: 5' 5" (1.651 m).   Weight as of this encounter: 90.719 kg (200 lb).   Imaging Review Plain radiographs demonstrate severe degenerative joint disease of the right   knee(s). The overall alignment issignificant varus. The bone quality appears to be good for age and reported activity level.  Assessment/Plan:  End stage arthritis, right knee   The patient history, physical examination, clinical judgment of the provider and imaging studies are consistent with end stage degenerative  joint disease of the right knee(s) and total knee arthroplasty is deemed medically necessary. The treatment options including medical management, injection therapy arthroscopy and arthroplasty were discussed at length. The risks and benefits of total knee arthroplasty were presented and reviewed. The risks due to aseptic loosening, infection, stiffness, patella tracking problems, thromboembolic complications and other imponderables were discussed. The patient acknowledged the explanation, agreed to proceed with the plan and consent was signed. Patient is being admitted for inpatient treatment for surgery, pain control, PT, OT, prophylactic antibiotics, VTE prophylaxis, progressive ambulation and ADL's and discharge planning. The patient is planning to be discharged home with home health services  Ilyse Tremain A. Kamrie Fanton, PA-C Physician Assistant [facility] [phone]  08/08/2013, 2:54 PM   

## 2013-08-12 ENCOUNTER — Encounter (HOSPITAL_COMMUNITY): Payer: Self-pay

## 2013-08-12 ENCOUNTER — Encounter (HOSPITAL_COMMUNITY)
Admission: RE | Admit: 2013-08-12 | Discharge: 2013-08-12 | Disposition: A | Discharge status: Home / Self Care | Payer: Medicare Other | Source: Ambulatory Visit | Attending: Orthopedic Surgery | Admitting: Orthopedic Surgery

## 2013-08-12 ENCOUNTER — Encounter (HOSPITAL_COMMUNITY): Payer: Self-pay | Admitting: Pharmacy Technician

## 2013-08-12 ENCOUNTER — Ambulatory Visit (HOSPITAL_COMMUNITY)
Admission: RE | Admit: 2013-08-12 | Discharge: 2013-08-12 | Disposition: A | Discharge status: Home / Self Care | Payer: Medicare Other | Source: Ambulatory Visit | Attending: Physician Assistant | Admitting: Physician Assistant

## 2013-08-12 DIAGNOSIS — Z01811 Encounter for preprocedural respiratory examination: Secondary | ICD-10-CM | POA: Insufficient documentation

## 2013-08-12 DIAGNOSIS — Z01812 Encounter for preprocedural laboratory examination: Secondary | ICD-10-CM | POA: Insufficient documentation

## 2013-08-12 DIAGNOSIS — Z01818 Encounter for other preprocedural examination: Secondary | ICD-10-CM | POA: Insufficient documentation

## 2013-08-12 LAB — URINALYSIS, ROUTINE W REFLEX MICROSCOPIC
Bilirubin Urine: NEGATIVE
Hgb urine dipstick: NEGATIVE
Ketones, ur: NEGATIVE mg/dL
Nitrite: NEGATIVE
Protein, ur: NEGATIVE mg/dL
Specific Gravity, Urine: 1.024 (ref 1.005–1.030)
Urobilinogen, UA: 1 mg/dL (ref 0.0–1.0)

## 2013-08-12 LAB — URINE MICROSCOPIC-ADD ON

## 2013-08-12 LAB — CBC WITH DIFFERENTIAL/PLATELET
Basophils Relative: 0 % (ref 0–1)
Eosinophils Absolute: 0.4 10*3/uL (ref 0.0–0.7)
HCT: 36 % (ref 36.0–46.0)
Hemoglobin: 11.8 g/dL — ABNORMAL LOW (ref 12.0–15.0)
MCH: 28.3 pg (ref 26.0–34.0)
MCHC: 32.8 g/dL (ref 30.0–36.0)
Monocytes Absolute: 0.4 10*3/uL (ref 0.1–1.0)
Monocytes Relative: 5 % (ref 3–12)
Neutrophils Relative %: 57 % (ref 43–77)
RDW: 13.5 % (ref 11.5–15.5)

## 2013-08-12 LAB — SURGICAL PCR SCREEN: MRSA, PCR: NEGATIVE

## 2013-08-12 LAB — COMPREHENSIVE METABOLIC PANEL
Albumin: 3.6 g/dL (ref 3.5–5.2)
Alkaline Phosphatase: 86 U/L (ref 39–117)
BUN: 16 mg/dL (ref 6–23)
Creatinine, Ser: 0.6 mg/dL (ref 0.50–1.10)
Potassium: 4.3 mEq/L (ref 3.5–5.1)
Sodium: 139 mEq/L (ref 135–145)
Total Protein: 7.9 g/dL (ref 6.0–8.3)

## 2013-08-12 LAB — TYPE AND SCREEN: Antibody Screen: NEGATIVE

## 2013-08-12 LAB — PROTIME-INR
INR: 1.1 (ref 0.00–1.49)
Prothrombin Time: 14 seconds (ref 11.6–15.2)

## 2013-08-12 NOTE — Pre-Procedure Instructions (Signed)
Laronda Lisby  08/12/2013   Your procedure is scheduled on:  Monday, September 29th.  Report to [facility], Main Entrance / Entrance "A" at 11:00 AM.  Call this number if you have problems the morning of surgery: [phone]   Remember:   Do not eat food or drink liquids after midnight.   Take these medicines the morning of surgery with A SIP OF WATER: omeprazole (PRILOSEC).               Stop all Vitamins, Aspirin and Herbal medications.    Do not wear jewelry, make-up or nail polish.  Do not wear lotions, powders, or perfumes. You may wear deodorant.             Men may shave face and neck.  Do not bring valuables to the hospital.  [facility] is not responsible  for any belongings or valuables.  Contacts, dentures or bridgework may not be worn into surgery.  Leave suitcase in the car. After surgery it may be brought to your room.  For patients admitted to the hospital, checkout time is 11:00 AM the day of discharge.     Special Instructions: Shower using CHG 2 nights before surgery and the night before surgery.  If you shower the day of surgery use CHG.  Use special wash - you have one bottle of CHG for all showers.  You should use approximately 1/3 of the bottle for each shower.   Please read over the following fact sheets that you were given: Pain Booklet, Coughing and Deep Breathing, Blood Transfusion Information and Surgical Site Infection Prevention

## 2013-08-13 LAB — URINE CULTURE

## 2013-08-18 MED ORDER — TRANEXAMIC ACID 100 MG/ML IV SOLN
1000.0000 mg | INTRAVENOUS | Status: AC
  Administered 2013-08-19: 1000 mg via INTRAVENOUS
  Filled 2013-08-18: qty 10

## 2013-08-18 MED ORDER — DEXTROSE 5 % IV SOLN
3.0000 g | INTRAVENOUS | Status: AC
  Administered 2013-08-19: 3 g via INTRAVENOUS
  Filled 2013-08-18: qty 3000

## 2013-08-19 ENCOUNTER — Inpatient Hospital Stay (HOSPITAL_COMMUNITY)
Admission: RE | Admit: 2013-08-19 | Discharge: 2013-08-20 | DRG: 470 | Disposition: A | Discharge status: Home Health | Payer: Medicare Other | Source: Ambulatory Visit | Attending: Orthopedic Surgery | Admitting: Orthopedic Surgery

## 2013-08-19 ENCOUNTER — Encounter (HOSPITAL_COMMUNITY)
Admission: RE | Disposition: A | Discharge status: Home Health | Payer: Self-pay | Source: Ambulatory Visit | Attending: Orthopedic Surgery

## 2013-08-19 ENCOUNTER — Encounter (HOSPITAL_COMMUNITY): Payer: Self-pay | Admitting: Anesthesiology

## 2013-08-19 ENCOUNTER — Inpatient Hospital Stay (HOSPITAL_COMMUNITY): Payer: Medicare Other | Admitting: Anesthesiology

## 2013-08-19 ENCOUNTER — Encounter (HOSPITAL_COMMUNITY): Payer: Self-pay | Admitting: *Deleted

## 2013-08-19 DIAGNOSIS — E119 Type 2 diabetes mellitus without complications: Secondary | ICD-10-CM | POA: Diagnosis present

## 2013-08-19 DIAGNOSIS — Z96659 Presence of unspecified artificial knee joint: Secondary | ICD-10-CM

## 2013-08-19 DIAGNOSIS — Z7982 Long term (current) use of aspirin: Secondary | ICD-10-CM

## 2013-08-19 DIAGNOSIS — M1711 Unilateral primary osteoarthritis, right knee: Secondary | ICD-10-CM

## 2013-08-19 DIAGNOSIS — Z96653 Presence of artificial knee joint, bilateral: Secondary | ICD-10-CM

## 2013-08-19 DIAGNOSIS — K219 Gastro-esophageal reflux disease without esophagitis: Secondary | ICD-10-CM | POA: Diagnosis present

## 2013-08-19 DIAGNOSIS — Z79899 Other long term (current) drug therapy: Secondary | ICD-10-CM

## 2013-08-19 DIAGNOSIS — Z96652 Presence of left artificial knee joint: Secondary | ICD-10-CM

## 2013-08-19 DIAGNOSIS — M1710 Unilateral primary osteoarthritis, unspecified knee: Principal | ICD-10-CM | POA: Diagnosis present

## 2013-08-19 DIAGNOSIS — I10 Essential (primary) hypertension: Secondary | ICD-10-CM | POA: Diagnosis present

## 2013-08-19 DIAGNOSIS — Z833 Family history of diabetes mellitus: Secondary | ICD-10-CM

## 2013-08-19 HISTORY — DX: Type 2 diabetes mellitus without complications: E11.9

## 2013-08-19 HISTORY — PX: TOTAL KNEE ARTHROPLASTY: SHX125

## 2013-08-19 LAB — GLUCOSE, CAPILLARY: Glucose-Capillary: 180 mg/dL — ABNORMAL HIGH (ref 70–99)

## 2013-08-19 LAB — HEMOGLOBIN A1C: Mean Plasma Glucose: 143 mg/dL — ABNORMAL HIGH (ref <117)

## 2013-08-19 LAB — URINALYSIS, ROUTINE W REFLEX MICROSCOPIC
Glucose, UA: NEGATIVE mg/dL
Leukocytes, UA: NEGATIVE
Protein, ur: NEGATIVE mg/dL
Specific Gravity, Urine: 1.015 (ref 1.005–1.030)
pH: 6.5 (ref 5.0–8.0)

## 2013-08-19 SURGERY — ARTHROPLASTY, KNEE, TOTAL
Anesthesia: Spinal | Site: Knee | Laterality: Right | Wound class: Clean

## 2013-08-19 MED ORDER — PROPOFOL INFUSION 10 MG/ML OPTIME
INTRAVENOUS | Status: DC | PRN
  Administered 2013-08-19: 25 ug/kg/min via INTRAVENOUS

## 2013-08-19 MED ORDER — ADULT MULTIVITAMIN W/MINERALS CH
1.0000 | ORAL_TABLET | Freq: Every day | ORAL | Status: DC
  Filled 2013-08-19 (×2): qty 1

## 2013-08-19 MED ORDER — HYDROMORPHONE HCL PF 1 MG/ML IJ SOLN
0.5000 mg | INTRAMUSCULAR | Status: DC | PRN
Start: 2013-08-19 — End: 2013-08-20
  Administered 2013-08-19: 1 mg via INTRAVENOUS
  Filled 2013-08-19: qty 1

## 2013-08-19 MED ORDER — OXYCODONE HCL 5 MG/5ML PO SOLN: 5.0000 mg | Freq: Once | ORAL | Status: AC | PRN

## 2013-08-19 MED ORDER — HYDROMORPHONE HCL PF 1 MG/ML IJ SOLN
INTRAMUSCULAR | Status: AC
  Filled 2013-08-19: qty 1

## 2013-08-19 MED ORDER — ATROPINE SULFATE 0.4 MG/ML IJ SOLN
INTRAMUSCULAR | Status: DC | PRN
  Administered 2013-08-19: 0.4 mg via INTRAVENOUS

## 2013-08-19 MED ORDER — INSULIN ASPART 100 UNIT/ML ~~LOC~~ SOLN
4.0000 [IU] | Freq: Three times a day (TID) | SUBCUTANEOUS | Status: DC
  Administered 2013-08-19 – 2013-08-20 (×4): 4 [IU] via SUBCUTANEOUS

## 2013-08-19 MED ORDER — GLIPIZIDE 2.5 MG HALF TABLET
2.5000 mg | ORAL_TABLET | Freq: Every day | ORAL | Status: DC
  Filled 2013-08-19: qty 1

## 2013-08-19 MED ORDER — INSULIN ASPART 100 UNIT/ML ~~LOC~~ SOLN
0.0000 [IU] | Freq: Three times a day (TID) | SUBCUTANEOUS | Status: DC
  Administered 2013-08-19: 2 [IU] via SUBCUTANEOUS
  Administered 2013-08-20: 3 [IU] via SUBCUTANEOUS
  Administered 2013-08-20: 2 [IU] via SUBCUTANEOUS
  Administered 2013-08-20: 11 [IU] via SUBCUTANEOUS

## 2013-08-19 MED ORDER — ONDANSETRON HCL 4 MG/2ML IJ SOLN
INTRAMUSCULAR | Status: DC | PRN
  Administered 2013-08-19: 4 mg via INTRAVENOUS

## 2013-08-19 MED ORDER — DEXAMETHASONE 6 MG PO TABS
10.0000 mg | ORAL_TABLET | Freq: Three times a day (TID) | ORAL | Status: AC
  Administered 2013-08-19 – 2013-08-20 (×2): 10 mg via ORAL
  Filled 2013-08-19 (×3): qty 1

## 2013-08-19 MED ORDER — LACTATED RINGERS IV SOLN
Freq: Once | INTRAVENOUS | Status: AC
  Administered 2013-08-19: 09:00:00 via INTRAVENOUS

## 2013-08-19 MED ORDER — CELECOXIB 200 MG PO CAPS
200.0000 mg | ORAL_CAPSULE | Freq: Two times a day (BID) | ORAL | Status: DC
  Administered 2013-08-19 – 2013-08-20 (×2): 200 mg via ORAL
  Filled 2013-08-19 (×3): qty 1

## 2013-08-19 MED ORDER — CHLORHEXIDINE GLUCONATE 4 % EX LIQD: 60.0000 mL | Freq: Once | CUTANEOUS | Status: DC

## 2013-08-19 MED ORDER — LIDOCAINE HCL (CARDIAC) 20 MG/ML IV SOLN
INTRAVENOUS | Status: DC | PRN
  Administered 2013-08-19: 10 mg via INTRAVENOUS

## 2013-08-19 MED ORDER — OXYCODONE HCL 5 MG PO TABS
ORAL_TABLET | ORAL | Status: AC
  Filled 2013-08-19: qty 1

## 2013-08-19 MED ORDER — BISACODYL 5 MG PO TBEC
10.0000 mg | DELAYED_RELEASE_TABLET | Freq: Every day | ORAL | Status: DC
  Administered 2013-08-19: 10 mg via ORAL
  Filled 2013-08-19: qty 2

## 2013-08-19 MED ORDER — ONDANSETRON HCL 4 MG PO TABS: 4.0000 mg | ORAL_TABLET | Freq: Four times a day (QID) | ORAL | Status: DC | PRN

## 2013-08-19 MED ORDER — ACETAMINOPHEN 650 MG RE SUPP: 650.0000 mg | Freq: Four times a day (QID) | RECTAL | Status: DC | PRN

## 2013-08-19 MED ORDER — ALUM & MAG HYDROXIDE-SIMETH 200-200-20 MG/5ML PO SUSP: 30.0000 mL | ORAL | Status: DC | PRN

## 2013-08-19 MED ORDER — EPHEDRINE SULFATE 50 MG/ML IJ SOLN
INTRAMUSCULAR | Status: DC | PRN
  Administered 2013-08-19: 10 mg via INTRAVENOUS
  Administered 2013-08-19: 5 mg via INTRAVENOUS

## 2013-08-19 MED ORDER — INSULIN ASPART 100 UNIT/ML ~~LOC~~ SOLN: 0.0000 [IU] | Freq: Every day | SUBCUTANEOUS | Status: DC

## 2013-08-19 MED ORDER — FENTANYL CITRATE 0.05 MG/ML IJ SOLN
INTRAMUSCULAR | Status: DC | PRN
  Administered 2013-08-19: 50 ug via INTRAVENOUS

## 2013-08-19 MED ORDER — ONDANSETRON HCL 4 MG/2ML IJ SOLN: 4.0000 mg | Freq: Four times a day (QID) | INTRAMUSCULAR | Status: DC | PRN

## 2013-08-19 MED ORDER — DEXAMETHASONE SODIUM PHOSPHATE 10 MG/ML IJ SOLN
10.0000 mg | Freq: Three times a day (TID) | INTRAMUSCULAR | Status: AC
  Administered 2013-08-20: 10 mg via INTRAVENOUS
  Filled 2013-08-19 (×3): qty 1

## 2013-08-19 MED ORDER — METFORMIN HCL ER 500 MG PO TB24
500.0000 mg | ORAL_TABLET | Freq: Two times a day (BID) | ORAL | Status: DC
  Filled 2013-08-19: qty 1

## 2013-08-19 MED ORDER — POTASSIUM CHLORIDE IN NACL 20-0.9 MEQ/L-% IV SOLN
INTRAVENOUS | Status: DC
  Administered 2013-08-20: 100 mL/h via INTRAVENOUS
  Filled 2013-08-19 (×5): qty 1000

## 2013-08-19 MED ORDER — HYDROMORPHONE HCL PF 1 MG/ML IJ SOLN
0.2500 mg | INTRAMUSCULAR | Status: DC | PRN
  Administered 2013-08-19 (×4): 0.5 mg via INTRAVENOUS

## 2013-08-19 MED ORDER — LACTATED RINGERS IV SOLN
INTRAVENOUS | Status: DC | PRN
  Administered 2013-08-19 (×2): via INTRAVENOUS

## 2013-08-19 MED ORDER — MENTHOL 3 MG MT LOZG: 1.0000 | LOZENGE | OROMUCOSAL | Status: DC | PRN

## 2013-08-19 MED ORDER — OXYCODONE HCL 5 MG PO TABS
5.0000 mg | ORAL_TABLET | ORAL | Status: DC | PRN
  Administered 2013-08-19: 10 mg via ORAL
  Administered 2013-08-19: 5 mg via ORAL
  Administered 2013-08-20 (×3): 10 mg via ORAL
  Filled 2013-08-19 (×3): qty 2
  Filled 2013-08-19: qty 1
  Filled 2013-08-19: qty 2

## 2013-08-19 MED ORDER — ONDANSETRON HCL 4 MG/2ML IJ SOLN: 4.0000 mg | Freq: Once | INTRAMUSCULAR | Status: DC | PRN

## 2013-08-19 MED ORDER — SODIUM CHLORIDE 0.9 % IR SOLN
Status: DC | PRN
  Administered 2013-08-19: 3000 mL

## 2013-08-19 MED ORDER — PHENOL 1.4 % MT LIQD: 1.0000 | OROMUCOSAL | Status: DC | PRN

## 2013-08-19 MED ORDER — METOCLOPRAMIDE HCL 5 MG/ML IJ SOLN: 5.0000 mg | Freq: Three times a day (TID) | INTRAMUSCULAR | Status: DC | PRN

## 2013-08-19 MED ORDER — BUPIVACAINE-EPINEPHRINE PF 0.5-1:200000 % IJ SOLN
INTRAMUSCULAR | Status: DC | PRN
  Administered 2013-08-19: 30 mL

## 2013-08-19 MED ORDER — DEXAMETHASONE SODIUM PHOSPHATE 10 MG/ML IJ SOLN
INTRAMUSCULAR | Status: DC | PRN
  Administered 2013-08-19: 10 mg via INTRAVENOUS

## 2013-08-19 MED ORDER — ASPIRIN EC 325 MG PO TBEC
325.0000 mg | DELAYED_RELEASE_TABLET | Freq: Every day | ORAL | Status: DC
  Administered 2013-08-20: 325 mg via ORAL
  Filled 2013-08-19 (×2): qty 1

## 2013-08-19 MED ORDER — DOCUSATE SODIUM 100 MG PO CAPS
100.0000 mg | ORAL_CAPSULE | Freq: Two times a day (BID) | ORAL | Status: DC
  Administered 2013-08-20: 100 mg via ORAL
  Filled 2013-08-19 (×3): qty 1

## 2013-08-19 MED ORDER — METFORMIN HCL ER 500 MG PO TB24: 500.0000 mg | ORAL_TABLET | Freq: Two times a day (BID) | ORAL | Status: DC

## 2013-08-19 MED ORDER — BUPIVACAINE-EPINEPHRINE 0.25% -1:200000 IJ SOLN
INTRAMUSCULAR | Status: DC | PRN
  Administered 2013-08-19: 30 mL

## 2013-08-19 MED ORDER — ZOLPIDEM TARTRATE 5 MG PO TABS: 5.0000 mg | ORAL_TABLET | Freq: Every evening | ORAL | Status: DC | PRN

## 2013-08-19 MED ORDER — LACTATED RINGERS IV SOLN: INTRAVENOUS | Status: DC

## 2013-08-19 MED ORDER — METOCLOPRAMIDE HCL 10 MG PO TABS: 5.0000 mg | ORAL_TABLET | Freq: Three times a day (TID) | ORAL | Status: DC | PRN

## 2013-08-19 MED ORDER — PANTOPRAZOLE SODIUM 40 MG PO TBEC
40.0000 mg | DELAYED_RELEASE_TABLET | Freq: Every day | ORAL | Status: DC
  Administered 2013-08-20: 40 mg via ORAL
  Filled 2013-08-19: qty 1

## 2013-08-19 MED ORDER — ACETAMINOPHEN 325 MG PO TABS: 650.0000 mg | ORAL_TABLET | Freq: Four times a day (QID) | ORAL | Status: DC | PRN

## 2013-08-19 MED ORDER — MEPERIDINE HCL 25 MG/ML IJ SOLN: 6.2500 mg | INTRAMUSCULAR | Status: DC | PRN

## 2013-08-19 MED ORDER — CEFAZOLIN SODIUM-DEXTROSE 2-3 GM-% IV SOLR
2.0000 g | Freq: Four times a day (QID) | INTRAVENOUS | Status: AC
  Administered 2013-08-19 (×2): 2 g via INTRAVENOUS
  Filled 2013-08-19 (×2): qty 50

## 2013-08-19 MED ORDER — FENTANYL CITRATE 0.05 MG/ML IJ SOLN
INTRAMUSCULAR | Status: AC
  Administered 2013-08-19: 100 ug
  Filled 2013-08-19: qty 2

## 2013-08-19 MED ORDER — DIPHENHYDRAMINE HCL 12.5 MG/5ML PO ELIX: 12.5000 mg | ORAL_SOLUTION | ORAL | Status: DC | PRN

## 2013-08-19 MED ORDER — OXYCODONE HCL 5 MG PO TABS
5.0000 mg | ORAL_TABLET | Freq: Once | ORAL | Status: AC | PRN
  Administered 2013-08-19: 5 mg via ORAL

## 2013-08-19 MED ORDER — LOSARTAN POTASSIUM 50 MG PO TABS: 50.0000 mg | ORAL_TABLET | Freq: Every day | ORAL | Status: DC

## 2013-08-19 MED ORDER — POVIDONE-IODINE 7.5 % EX SOLN
Freq: Once | CUTANEOUS | Status: DC
  Filled 2013-08-19: qty 118

## 2013-08-19 SURGICAL SUPPLY — 66 items
BANDAGE ESMARK 6X9 LF (GAUZE/BANDAGES/DRESSINGS) ×1 IMPLANT
BLADE SAGITTAL 25.0X1.19X90 (BLADE) ×2 IMPLANT
BLADE SAW SGTL 11.0X1.19X90.0M (BLADE) IMPLANT
BLADE SAW SGTL 13.0X1.19X90.0M (BLADE) ×2 IMPLANT
BLADE SURG 10 STRL SS (BLADE) ×4 IMPLANT
BNDG CMPR 9X6 STRL LF SNTH (GAUZE/BANDAGES/DRESSINGS) ×1
BNDG CMPR MED 15X6 ELC VLCR LF (GAUZE/BANDAGES/DRESSINGS) ×1
BNDG ELASTIC 6X15 VLCR STRL LF (GAUZE/BANDAGES/DRESSINGS) ×2 IMPLANT
BNDG ESMARK 6X9 LF (GAUZE/BANDAGES/DRESSINGS) ×2
BOWL SMART MIX CTS (DISPOSABLE) ×2 IMPLANT
CAPT RP KNEE ×1 IMPLANT
CEMENT HV SMART SET (Cement) ×4 IMPLANT
CLOTH BEACON ORANGE TIMEOUT ST (SAFETY) ×2 IMPLANT
COVER SURGICAL LIGHT HANDLE (MISCELLANEOUS) ×2 IMPLANT
CUFF TOURNIQUET SINGLE 34IN LL (TOURNIQUET CUFF) ×2 IMPLANT
CUFF TOURNIQUET SINGLE 44IN (TOURNIQUET CUFF) IMPLANT
DRAPE EXTREMITY T 121X128X90 (DRAPE) ×2 IMPLANT
DRAPE INCISE IOBAN 66X45 STRL (DRAPES) ×2 IMPLANT
DRAPE PROXIMA HALF (DRAPES) ×2 IMPLANT
DRAPE U-SHAPE 47X51 STRL (DRAPES) ×2 IMPLANT
DRSG ADAPTIC 3X8 NADH LF (GAUZE/BANDAGES/DRESSINGS) ×2 IMPLANT
DRSG PAD ABDOMINAL 8X10 ST (GAUZE/BANDAGES/DRESSINGS) ×4 IMPLANT
DURAPREP 26ML APPLICATOR (WOUND CARE) ×2 IMPLANT
ELECT CAUTERY BLADE 6.4 (BLADE) ×2 IMPLANT
ELECT REM PT RETURN 9FT ADLT (ELECTROSURGICAL) ×2
ELECTRODE REM PT RTRN 9FT ADLT (ELECTROSURGICAL) ×1 IMPLANT
EVACUATOR 1/8 PVC DRAIN (DRAIN) ×1 IMPLANT
FACESHIELD LNG OPTICON STERILE (SAFETY) ×2 IMPLANT
GLOVE BIO SURGEON STRL SZ7 (GLOVE) ×2 IMPLANT
GLOVE BIOGEL PI IND STRL 7.0 (GLOVE) ×1 IMPLANT
GLOVE BIOGEL PI IND STRL 7.5 (GLOVE) ×1 IMPLANT
GLOVE BIOGEL PI INDICATOR 7.0 (GLOVE) ×1
GLOVE BIOGEL PI INDICATOR 7.5 (GLOVE) ×1
GLOVE SS BIOGEL STRL SZ 7.5 (GLOVE) ×1 IMPLANT
GLOVE SUPERSENSE BIOGEL SZ 7.5 (GLOVE) ×1
GOWN PREVENTION PLUS XLARGE (GOWN DISPOSABLE) ×4 IMPLANT
GOWN STRL NON-REIN LRG LVL3 (GOWN DISPOSABLE) ×4 IMPLANT
HANDPIECE INTERPULSE COAX TIP (DISPOSABLE) ×2
HOOD PEEL AWAY FACE SHEILD DIS (HOOD) ×4 IMPLANT
IMMOBILIZER KNEE 22 UNIV (SOFTGOODS) ×1 IMPLANT
KIT BASIN OR (CUSTOM PROCEDURE TRAY) ×2 IMPLANT
KIT ROOM TURNOVER OR (KITS) ×2 IMPLANT
MANIFOLD NEPTUNE II (INSTRUMENTS) ×2 IMPLANT
NS IRRIG 1000ML POUR BTL (IV SOLUTION) ×2 IMPLANT
PACK TOTAL JOINT (CUSTOM PROCEDURE TRAY) ×2 IMPLANT
PAD ARMBOARD 7.5X6 YLW CONV (MISCELLANEOUS) ×4 IMPLANT
PAD CAST 4YDX4 CTTN HI CHSV (CAST SUPPLIES) ×1 IMPLANT
PADDING CAST COTTON 4X4 STRL (CAST SUPPLIES) ×2
PADDING CAST COTTON 6X4 STRL (CAST SUPPLIES) ×2 IMPLANT
POSITIONER HEAD PRONE TRACH (MISCELLANEOUS) ×2 IMPLANT
RUBBERBAND STERILE (MISCELLANEOUS) ×2 IMPLANT
SET HNDPC FAN SPRY TIP SCT (DISPOSABLE) ×1 IMPLANT
SPONGE GAUZE 4X4 12PLY (GAUZE/BANDAGES/DRESSINGS) ×2 IMPLANT
STRIP CLOSURE SKIN 1/2X4 (GAUZE/BANDAGES/DRESSINGS) ×2 IMPLANT
SUCTION FRAZIER TIP 10 FR DISP (SUCTIONS) ×2 IMPLANT
SUT ETHIBOND NAB CT1 #1 30IN (SUTURE) ×4 IMPLANT
SUT MNCRL AB 3-0 PS2 18 (SUTURE) ×2 IMPLANT
SUT VIC AB 0 CT1 27 (SUTURE) ×4
SUT VIC AB 0 CT1 27XBRD ANBCTR (SUTURE) ×2 IMPLANT
SUT VIC AB 2-0 CT1 27 (SUTURE) ×4
SUT VIC AB 2-0 CT1 TAPERPNT 27 (SUTURE) ×2 IMPLANT
SYR 30ML SLIP (SYRINGE) ×2 IMPLANT
TOWEL OR 17X24 6PK STRL BLUE (TOWEL DISPOSABLE) ×2 IMPLANT
TOWEL OR 17X26 10 PK STRL BLUE (TOWEL DISPOSABLE) ×2 IMPLANT
TRAY FOLEY CATH 16FR SILVER (SET/KITS/TRAYS/PACK) ×2 IMPLANT
WATER STERILE IRR 1000ML POUR (IV SOLUTION) ×4 IMPLANT

## 2013-08-19 NOTE — Transfer of Care (Signed)
Immediate Anesthesia Transfer of Care Note  Patient: [name]  Procedure(s) Performed: Procedure(s): TOTAL KNEE ARTHROPLASTY (Right)  Patient Location: PACU  Anesthesia Type:Spinal  Level of Consciousness: awake, alert  and oriented  Airway & Oxygen Therapy: Patient Spontanous Breathing and Patient connected to nasal cannula oxygen  Post-op Assessment: Report given to PACU RN, Post -op Vital signs reviewed and stable and Patient moving all extremities X 4  Post vital signs: Reviewed and stable  Complications: No apparent anesthesia complications

## 2013-08-19 NOTE — Anesthesia Procedure Notes (Addendum)
Anesthesia Regional Block:  Adductor canal block  Pre-Anesthetic Checklist: ,, timeout performed, Correct Patient, Correct Site, Correct Laterality, Correct Procedure, Correct Position, site marked, Risks and benefits discussed,  Surgical consent,  Pre-op evaluation,  At surgeon's request and post-op pain management  Laterality: Right  Prep: chloraprep       Needles:  Injection technique: Single-shot  Needle Type: Echogenic Stimulator Needle     Needle Length: 10cm 10 cm Needle Gauge: 21 and 21 G    Additional Needles:  Procedures: ultrasound guided (picture in chart) Adductor canal block Narrative:  Start time: 08/19/2013 9:40 AM End time: 08/19/2013 9:55 AM Injection made incrementally with aspirations every 5 mL.  Performed by: Personally  Anesthesiologist: Arta Bruce MD  Additional Notes: Monitors applied. Patient sedated. Sterile prep and drape,hand hygiene and sterile gloves were used. Relevant anatomy identified.Needle position confirmed.Local anesthetic injected incrementally after negative aspiration. Local anesthetic spread visualized around nerve(s). Vascular puncture avoided. No complications. Image printed for medical record.The patient tolerated the procedure well.    Arta Bruce MD  Adductor canal block Procedure Name: MAC Date/Time: 08/19/2013 11:32 AM Performed by: Carmela Rima Pre-anesthesia Checklist: Patient identified, Timeout performed, Emergency Drugs available and Patient being monitored Patient Re-evaluated:Patient Re-evaluated prior to inductionOxygen Delivery Method: Simple face mask Placement Confirmation: positive ETCO2    Spinal  Patient location during procedure: OR Start time: 08/19/2013 11:03 AM End time: 08/19/2013 11:10 AM Staffing Anesthesiologist: Arta Bruce DAVID Preanesthetic Checklist Completed: patient identified, surgical consent, pre-op evaluation, IV checked, risks and benefits discussed and monitors and equipment  checked Spinal Block Patient position: sitting Prep: ChloraPrep Patient monitoring: heart rate, cardiac monitor, continuous pulse ox and blood pressure Approach: right paramedian Location: L3-4 Injection technique: single-shot Needle Needle type: Quincke  Needle gauge: 22 G Needle length: 9 cm Needle insertion depth: 8 cm

## 2013-08-19 NOTE — Op Note (Signed)
MRN:     [id number] DOB/AGE:    [birth year] / 77 y.o.       OPERATIVE REPORT    DATE OF PROCEDURE:  08/19/2013       PREOPERATIVE DIAGNOSIS:   OA RIGHT KNEE      There is no weight on file to calculate BMI.                                                        POSTOPERATIVE DIAGNOSIS:   osteoarthritis right knee                                                                      PROCEDURE:  Procedure(s): TOTAL KNEE ARTHROPLASTY Using Depuy Sigma RP implants #4 narrow Femur, #3Tibia, 10mm sigma RP bearing, 32 Patella     SURGEON: Jencarlo Bonadonna A    ASSISTANT:  Kirstin Shepperson PA-C   (Present and scrubbed throughout the case, critical for assistance with exposure, retraction, instrumentation, and closure.)         ANESTHESIA: Spinal with Femoral Nerve Block  DRAINS: foley, 2 medium hemovac in knee   TOURNIQUET TIME:   COMPLICATIONS:  None     SPECIMENS: None   INDICATIONS FOR PROCEDURE: The patient has  OA RIGHT KNEE, varus deformities, XR shows bone on bone arthritis. Patient has failed all conservative measures including anti-inflammatory medicines, narcotics, attempts at  exercise and weight loss, cortisone injections and viscosupplementation.  Risks and benefits of surgery have been discussed, questions answered.   DESCRIPTION OF PROCEDURE: The patient identified by armband, received  right femoral nerve block and IV antibiotics, in the holding area at [facility]. Patient taken to the operating room, appropriate anesthetic  monitors were attached General endotracheal anesthesia induced with  the patient in supine position, Foley catheter was inserted. Tourniquet  applied high to the operative thigh. Lateral post and foot positioner  applied to the table, the lower extremity was then prepped and draped  in usual sterile fashion from the ankle to the tourniquet. Time-out procedure was performed. The limb was wrapped with an Esmarch bandage and the tourniquet  inflated to 365 mmHg. We began the operation by making the anterior midline incision starting at handbreadth above the patella going over the patella 1 cm medial to and  4 cm distal to the tibial tubercle. Small bleeders in the skin and the  subcutaneous tissue identified and cauterized. Transverse retinaculum was incised and reflected medially and a medial parapatellar arthrotomy was accomplished. the patella was everted and theprepatellar fat pad resected. The superficial medial collateral  ligament was then elevated from anterior to posterior along the proximal  flare of the tibia and anterior half of the menisci resected. The knee was hyperflexed exposing bone on bone arthritis. Peripheral and notch osteophytes as well as the cruciate ligaments were then resected. We continued to  work our way around posteriorly along the proximal tibia, and externally  rotated the tibia subluxing it out from underneath the femur. A McHale  retractor was placed through the notch and a lateral [facility]  retractor  placed, and we then drilled through the proximal tibia in line with the  axis of the tibia followed by an intramedullary guide rod and 2-degree  posterior slope cutting guide. The tibial cutting guide was pinned into place  allowing resection of 4 mm of bone medially and about 4 mm of bone  laterally because of her varus deformity. Satisfied with the tibial resection, we then  entered the distal femur 2 mm anterior to the PCL origin with the  intramedullary guide rod and applied the distal femoral cutting guide  set at 11mm, with 5 degrees of valgus. This was pinned along the  epicondylar axis. At this point, the distal femoral cut was accomplished without difficulty. We then sized for a #4 narrow femoral component and pinned the guide in 3 degrees of external rotation.The chamfer cutting guide was pinned into place. The anterior, posterior, and chamfer cuts were accomplished without difficulty followed by   the Sigma RP box cutting guide and the box cut. We also removed posterior osteophytes from the posterior femoral condyles. At this  time, the knee was brought into full extension. We checked our  extension and flexion gaps and found them symmetric at 10mm.  The patella thickness measured at 21 mm. We set the cutting guide at 13 and removed the posterior 8 mm  of the patella sized for 32 button and drilled the lollipop. The knee  was then once again hyperflexed exposing the proximal tibia. We sized for a #3 tibial base plate, applied the smokestack and the conical reamer followed by the the Delta fin keel punch. We then hammered into place the Sigma RP trial femoral component, inserted a 10-mm trial bearing, trial patellar button, and took the knee through range of motion from 0-130 degrees. No thumb pressure was required for patellar  tracking. At this point, all trial components were removed, a double batch of DePuy HV cement  was mixed and applied to all bony metallic mating surfaces except for the posterior condyles of the femur itself. In order, we  hammered into place the tibial tray and removed excess cement, the femoral component and removed excess cement, a 10-mm Sigma RP bearing  was inserted, and the knee brought to full extension with compression.  The patellar button was clamped into place, and excess cement  removed. While the cement cured the wound was irrigated out with normal saline solution pulse lavage, and medium Hemovac drains were placed.. Ligament stability and patellar tracking were checked and found to be excellent. The tourniquet was then released and hemostasis was obtained with cautery. The parapatellar arthrotomy was closed with  #1 ethibond suture. The subcutaneous tissue with 0 and 2-0 undyed  Vicryl suture, and 4-0 Monocryl.. A dressing of Xeroform,  4 x 4, dressing sponges, Webril, and Ace wrap applied. Needle and sponge count were correct times 2.The patient awakened,  extubated, and taken to recovery room without difficulty. Vascular status was normal, pulses 2+ and symmetric.   [name] A 08/19/2013, 12:41 PM

## 2013-08-19 NOTE — Progress Notes (Signed)
UR COMPLETED  

## 2013-08-19 NOTE — Preoperative (Signed)
Beta Blockers   Reason not to administer Beta Blockers:Not Applicable 

## 2013-08-19 NOTE — Anesthesia Preprocedure Evaluation (Addendum)
Anesthesia Evaluation   Patient awake    Reviewed: Allergy & Precautions, H&P , NPO status , Patient's Chart, lab work & pertinent test results  Airway Mallampati: I TM Distance: >3 FB Neck ROM: Full    Dental   Pulmonary          Cardiovascular hypertension, Pt. on medications     Neuro/Psych    GI/Hepatic GERD-  Medicated and Controlled,  Endo/Other  diabetes, Well Controlled, Type 2, Oral Hypoglycemic Agents  Renal/GU      Musculoskeletal   Abdominal   Peds  Hematology   Anesthesia Other Findings   Reproductive/Obstetrics                          Anesthesia Physical Anesthesia Plan  ASA: III  Anesthesia Plan: Spinal   Post-op Pain Management: MAC Combined w/ Regional for Post-op pain   Induction: Intravenous  Airway Management Planned: Simple Face Mask  Additional Equipment:   Intra-op Plan:   Post-operative Plan:   Informed Consent: I have reviewed the patients History and Physical, chart, labs and discussed the procedure including the risks, benefits and alternatives for the proposed anesthesia with the patient or authorized representative who has indicated his/her understanding and acceptance.   Dental Advisory Given  Plan Discussed with: CRNA, Surgeon and Anesthesiologist  Anesthesia Plan Comments:        Anesthesia Quick Evaluation

## 2013-08-19 NOTE — H&P (View-Only) (Signed)
TOTAL KNEE ADMISSION H&P  Patient is being admitted for right total knee arthroplasty.  Subjective:  Chief Complaint:right knee pain.  HPI: Larsen Zettel, 79 y.o. female, has a history of pain and functional disability in the right knee due to arthritis and has failed non-surgical conservative treatments for greater than 12 weeks to includeNSAID's and/or analgesics, corticosteriod injections, viscosupplementation injections, flexibility and strengthening excercises, supervised PT with diminished ADL's post treatment, use of assistive devices and activity modification.  Onset of symptoms was gradual, starting 8 years ago with gradually worsening course since that time. The patient noted no past surgery on the right knee(s).  Patient currently rates pain in the right knee(s) at 10 out of 10 with activity. Patient has night pain, worsening of pain with activity and weight bearing, pain that interferes with activities of daily living, crepitus and joint swelling.  Patient has evidence of subchondral sclerosis, periarticular osteophytes and joint space narrowing by imaging studies. There is no active infection.  There are no active problems to display for this patient.  Past Medical History  Diagnosis Date  . Diabetes   . Hypertension   . GERD (gastroesophageal reflux disease)   . Status post total left knee replacement 2012    Dr Thurston Hole  . Right knee DJD     Past Surgical History  Procedure Laterality Date  . Total knee arthroplasty Left 03/28/2011    Dr Thurston Hole  . Neck surgery  12/28/2010    Dr Danielle Dess     (Not in a hospital admission) No Known Allergies  History  Substance Use Topics  . Smoking status: Never Smoker   . Smokeless tobacco: Not on file  . Alcohol Use: No    Family History  Problem Relation Age of Onset  . Diabetes Mother      Review of Systems  Constitutional: Negative.   Eyes: Negative.   Respiratory: Negative.   Cardiovascular: Negative.   Gastrointestinal:  Negative.   Genitourinary: Negative.   Musculoskeletal: Positive for back pain and joint pain.  Skin: Negative.   Neurological: Negative.   Endo/Heme/Allergies: Negative.   Psychiatric/Behavioral: Negative.     Objective:  Physical Exam  Constitutional: She appears well-developed and well-nourished.  HENT:  Head: Normocephalic and atraumatic.  Eyes: Conjunctivae are normal. Pupils are equal, round, and reactive to light.  Neck: Normal range of motion. Neck supple.  Cardiovascular: Normal rate and regular rhythm.   Murmur heard. Respiratory: Effort normal and breath sounds normal.  GI: Soft. Bowel sounds are normal.  Genitourinary:  Not pertinent to current symptomatology therefore not examined.  Musculoskeletal:  Examination of her right knee reveals moderate varus deformity. 1 to 2+ crepitation 1+ synovitis range of motion from-10 to 95 degrees knee is stable with diffuse pain and normal patella tracking. Exam of her left knee reveals the incision is well healed. No w or pain range of motion 0-115 degrees knee is stable with normal patella tracking. Vascular exam: pulses 2+ and symmetric.  VERY DECONDITIONED  Neurological: She is alert.  Skin: Skin is warm and dry.  Psychiatric: She has a normal mood and affect. Her behavior is normal.    Vital signs in last 24 hours: @VSRANGES @  Labs:   Estimated body mass index is 33.28 kg/(m^2) as calculated from the following:   Height as of this encounter: 5\' 5"  (1.651 m).   Weight as of this encounter: 90.719 kg (200 lb).   Imaging Review Plain radiographs demonstrate severe degenerative joint disease of the right  knee(s). The overall alignment issignificant varus. The bone quality appears to be good for age and reported activity level.  Assessment/Plan:  End stage arthritis, right knee   The patient history, physical examination, clinical judgment of the provider and imaging studies are consistent with end stage degenerative  joint disease of the right knee(s) and total knee arthroplasty is deemed medically necessary. The treatment options including medical management, injection therapy arthroscopy and arthroplasty were discussed at length. The risks and benefits of total knee arthroplasty were presented and reviewed. The risks due to aseptic loosening, infection, stiffness, patella tracking problems, thromboembolic complications and other imponderables were discussed. The patient acknowledged the explanation, agreed to proceed with the plan and consent was signed. Patient is being admitted for inpatient treatment for surgery, pain control, PT, OT, prophylactic antibiotics, VTE prophylaxis, progressive ambulation and ADL's and discharge planning. The patient is planning to be discharged home with home health services  Rueben Kassim A. Gwinda Passe Physician Assistant [facility] [phone]  08/08/2013, 2:54 PM

## 2013-08-19 NOTE — Progress Notes (Signed)
Orthopedic Tech Progress Note Patient Details:  [name] 1932/05/09 [id number]  CPM Right Knee CPM Right Knee: On Right Knee Flexion (Degrees): 60 Right Knee Extension (Degrees): 0 Additional Comments: Trapeze bar and foot roll   Cammer, Mickie Bail 08/19/2013, 3:51 PM

## 2013-08-19 NOTE — Anesthesia Postprocedure Evaluation (Signed)
  Anesthesia Post-op Note  Patient: [name]  Procedure(s) Performed: Procedure(s): TOTAL KNEE ARTHROPLASTY (Right)  Patient Location: PACU  Anesthesia Type:Spinal  Level of Consciousness: awake, alert  and oriented  Airway and Oxygen Therapy: Patient Spontanous Breathing  Post-op Pain: none  Post-op Assessment: Post-op Vital signs reviewed, Patient's Cardiovascular Status Stable and Respiratory Function Stable  Post-op Vital Signs: Reviewed and stable  Complications: No apparent anesthesia complications

## 2013-08-19 NOTE — Interval H&P Note (Signed)
History and Physical Interval Note:  08/19/2013 10:51 AM  [name]  has presented today for surgery, with the diagnosis of OA RIGHT KNEE  The various methods of treatment have been discussed with the patient and family. After consideration of risks, benefits and other options for treatment, the patient has consented to  Procedure(s): TOTAL KNEE ARTHROPLASTY (Right) as a surgical intervention .  The patient's history has been reviewed, patient examined, no change in status, stable for surgery.  I have reviewed the patient's chart and labs.  Questions were answered to the patient's satisfaction.     Salvatore Marvel A

## 2013-08-19 NOTE — Progress Notes (Signed)
Report given to elise rn as caregiver 

## 2013-08-19 NOTE — Progress Notes (Signed)
Orthopedic Tech Progress Note Patient Details:  [name] 1932/10/17 [id number]  CPM Right Knee CPM Right Knee: On Right Knee Flexion (Degrees): 60 Right Knee Extension (Degrees): 0 Additional Comments: Trapeze bar and foot roll   Cammer, Mickie Bail 08/19/2013, 3:16 PM

## 2013-08-19 NOTE — Evaluation (Signed)
Physical Therapy Evaluation Patient Details Name: [name] MRN: [id number] DOB: June 01, 1932 Today's Date: 08/19/2013 Time: 4098-1191 PT Time Calculation (min): 23 min  PT Assessment / Plan / Recommendation History of Present Illness  Patient is a 79 yo female s/p Rt TKA.  Patient with h/o Lt TKA in 2012.  Clinical Impression  Patient presents with problems listed below.  Will benefit from acute PT to maximize independence prior to discharge home with daughter.  Recommend HHPT at discharge if patient progresses - if not, may need to consider SNF.    PT Assessment  Patient needs continued PT services    Follow Up Recommendations  Home health PT;Supervision/Assistance - 24 hour    Does the patient have the potential to tolerate intense rehabilitation      Barriers to Discharge        Equipment Recommendations  None recommended by PT    Recommendations for Other Services     Frequency 7X/week    Precautions / Restrictions Precautions Precautions: Knee;Fall Precaution Booklet Issued: Yes (comment) Precaution Comments: Reviewed precautions with patient. Required Braces or Orthoses: Knee Immobilizer - Right Knee Immobilizer - Right: On except when in CPM;Discontinue once straight leg raise with < 10 degree lag Restrictions Weight Bearing Restrictions: Yes RLE Weight Bearing: Weight bearing as tolerated   Pertinent Vitals/Pain Pain limiting mobility.      Mobility  Bed Mobility Bed Mobility: Supine to Sit;Sitting - Scoot to Edge of Bed Supine to Sit: 3: Mod assist;With rails;HOB elevated Sitting - Scoot to Edge of Bed: 4: Min assist;With rail Details for Bed Mobility Assistance: Instructed patient on donning KI on RLE.  Verbal cues for technique for mobility.  Assist to move RLE off of bed, and to raise trunk to sitting position.  Once upright, patient with good sitting balance. Transfers Transfers: Sit to Stand;Stand to Sit;Stand Pivot Transfers Sit to Stand: 2: Max  assist;From elevated surface;With upper extremity assist;From bed Stand to Sit: 3: Mod assist;With upper extremity assist;With armrests;To chair/3-in-1 Stand Pivot Transfers: 2: Max assist Details for Transfer Assistance: Verbal cues for hand placement.  Patient attempted to stand x2 - unable.  Elevated bed and patient stood with assist.  In standing, cues to stand upright.  Patient then took several steps to pivot to chair with assist.  Verbal cues for hand placement and assist to control descent into chair. Ambulation/Gait Ambulation/Gait Assistance: Not tested (comment)    Exercises Total Joint Exercises Ankle Circles/Pumps: AROM;Both;10 reps;Supine   PT Diagnosis: Difficulty walking;Generalized weakness;Acute pain  PT Problem List: Decreased strength;Decreased range of motion;Decreased activity tolerance;Decreased balance;Decreased mobility;Decreased knowledge of use of DME;Decreased knowledge of precautions;Obesity;Pain PT Treatment Interventions: DME instruction;Gait training;Stair training;Functional mobility training;Therapeutic exercise;Patient/family education     PT Goals(Current goals can be found in the care plan section) Acute Rehab PT Goals Patient Stated Goal: To go home PT Goal Formulation: With patient Time For Goal Achievement: 08/26/13 Potential to Achieve Goals: Good  Visit Information  Last PT Received On: 08/19/13 Assistance Needed: +2 (Safety) History of Present Illness: Patient is a 79 yo female s/p Rt TKA.  Patient with h/o Lt TKA in 2012.       Prior Functioning  Home Living Family/patient expects to be discharged to:: Private residence Living Arrangements: Children Available Help at Discharge: Family;Available 24 hours/day Type of Home: House Home Access: Stairs to enter [employer] of Steps: 2 Entrance Stairs-Rails: None Home Layout: One level Home Equipment: Tub bench;Bedside commode;Wheelchair - [employer] - 2 wheels;Cane - single  point Prior Function Level of Independence: Independent with assistive device(s);Needs assistance Gait / Transfers Assistance Needed: Using walker pta due to RLE pain ADL's / Homemaking Assistance Needed: Assist with meal prep and housekeeping.   Communication Communication: No difficulties    Cognition  Cognition Arousal/Alertness: Awake/alert Behavior During Therapy: WFL for tasks assessed/performed Overall Cognitive Status: Within Functional Limits for tasks assessed    Extremity/Trunk Assessment Upper Extremity Assessment Upper Extremity Assessment: Generalized weakness Lower Extremity Assessment Lower Extremity Assessment: Generalized weakness;RLE deficits/detail RLE Deficits / Details: Decreased strength and ROM due to surgery and pain.  Patient able to assist with moving RLE off of bed. RLE: Unable to fully assess due to pain   Balance Balance Balance Assessed: Yes Static Sitting Balance Static Sitting - Balance Support: Right upper extremity supported;Feet supported Static Sitting - Level of Assistance: 5: Stand by assistance Static Sitting - Comment/# of Minutes: 5 Static Standing Balance Static Standing - Balance Support: Bilateral upper extremity supported Static Standing - Level of Assistance: 3: Mod assist Static Standing - Comment/# of Minutes: 2  End of Session PT - End of Session Equipment Utilized During Treatment: Gait belt;Right knee immobilizer Activity Tolerance: Patient limited by pain;Patient limited by fatigue Patient left: in chair;with call bell/phone within reach Nurse Communication: Mobility status CPM Right Knee CPM Right Knee: Off  GP     Vena Austria 08/19/2013, 6:30 PM  Durenda Hurt. Renaldo Fiddler, [facility] Acute Rehab Services Pager [phone]

## 2013-08-20 ENCOUNTER — Encounter (HOSPITAL_COMMUNITY): Payer: Self-pay | Admitting: Orthopedic Surgery

## 2013-08-20 LAB — GLUCOSE, CAPILLARY: Glucose-Capillary: 302 mg/dL — ABNORMAL HIGH (ref 70–99)

## 2013-08-20 LAB — BASIC METABOLIC PANEL
BUN: 18 mg/dL (ref 6–23)
CO2: 21 mEq/L (ref 19–32)
Calcium: 9.1 mg/dL (ref 8.4–10.5)
Chloride: 102 mEq/L (ref 96–112)
Creatinine, Ser: 0.63 mg/dL (ref 0.50–1.10)
Glucose, Bld: 179 mg/dL — ABNORMAL HIGH (ref 70–99)
Potassium: 4.8 mEq/L (ref 3.5–5.1)

## 2013-08-20 LAB — CBC
HCT: 32.8 % — ABNORMAL LOW (ref 36.0–46.0)
Hemoglobin: 11 g/dL — ABNORMAL LOW (ref 12.0–15.0)
MCH: 28.3 pg (ref 26.0–34.0)
MCV: 84.3 fL (ref 78.0–100.0)
Platelets: 288 10*3/uL (ref 150–400)
RBC: 3.89 MIL/uL (ref 3.87–5.11)
WBC: 8.5 10*3/uL (ref 4.0–10.5)

## 2013-08-20 LAB — URINE CULTURE
Colony Count: NO GROWTH
Culture: NO GROWTH

## 2013-08-20 MED ORDER — ASPIRIN 325 MG PO TBEC: 325.0000 mg | DELAYED_RELEASE_TABLET | Freq: Every day | ORAL | Status: DC

## 2013-08-20 MED ORDER — OXYCODONE HCL 5 MG PO TABS: ORAL_TABLET | ORAL | Status: DC

## 2013-08-20 MED ORDER — BISACODYL 5 MG PO TBEC: DELAYED_RELEASE_TABLET | ORAL | Status: DC

## 2013-08-20 MED ORDER — DSS 100 MG PO CAPS: ORAL_CAPSULE | ORAL | Status: DC

## 2013-08-20 MED ORDER — CELECOXIB 200 MG PO CAPS: ORAL_CAPSULE | ORAL | Status: DC

## 2013-08-20 MED ORDER — ACETAMINOPHEN 325 MG PO TABS: 650.0000 mg | ORAL_TABLET | Freq: Four times a day (QID) | ORAL | Status: DC | PRN

## 2013-08-20 NOTE — Discharge Summary (Signed)
Patient ID: [name] MRN: [id number] DOB/AGE: 1932/07/26 79 y.o.  Admit date: 08/19/2013 Discharge date: 08/20/2013  Admission Diagnoses:  Principal Problem:   Right knee DJD Active Problems:   Diabetes   Hypertension   GERD (gastroesophageal reflux disease)   Status post total left knee replacement   Discharge Diagnoses:  Same  Past Medical History  Diagnosis Date  . Hypertension   . GERD (gastroesophageal reflux disease)   . Type II diabetes mellitus   . Right knee DJD     Surgeries: Procedure(s): TOTAL KNEE ARTHROPLASTY on 08/19/2013   Consultants:    Discharged Condition: Improved  Hospital Course: Davan Hark is an 79 y.o. female who was admitted 08/19/2013 for operative treatment ofRight knee DJD. Patient has severe unremitting pain that affects sleep, daily activities, and work/hobbies. After pre-op clearance the patient was taken to the operating room on 08/19/2013 and underwent  Procedure(s): TOTAL KNEE ARTHROPLASTY.    Patient was given perioperative antibiotics: Anti-infectives   Start     Dose/Rate Route Frequency Ordered Stop   08/19/13 1700  ceFAZolin (ANCEF) IVPB 2 g/50 mL premix     2 g 100 mL/hr over 30 Minutes Intravenous Every 6 hours 08/19/13 1541 08/19/13 2250   08/19/13 0600  ceFAZolin (ANCEF) 3 g in dextrose 5 % 50 mL IVPB     3 g 160 mL/hr over 30 Minutes Intravenous On call to O.R. 08/18/13 1343 08/19/13 1101       Patient was given sequential compression devices, early ambulation, and chemoprophylaxis to prevent DVT.  Patient benefited maximally from hospital stay and there were no complications.    Recent vital signs: Patient Vitals for the past 24 hrs:  BP Temp Temp src Pulse Resp SpO2  08/20/13 0647 134/65 mmHg 98.7 F (37.1 C) Oral 62 18 99 %  08/20/13 0127 136/66 mmHg 98.8 F (37.1 C) Oral 55 18 98 %  08/20/13 0000 - - - - 20 98 %  08/19/13 2048 143/63 mmHg 98.7 F (37.1 C) Oral 68 18 98 %  08/19/13 1600 - - - - 18 -   08/19/13 1430 161/74 mmHg 98.6 F (37 C) - 51 16 99 %  08/19/13 1415 158/67 mmHg - - 53 21 98 %  08/19/13 1400 145/96 mmHg - - 57 18 97 %  08/19/13 1345 129/81 mmHg - - 64 20 99 %  08/19/13 1330 129/88 mmHg - - 49 14 98 %  08/19/13 1315 118/68 mmHg - - 50 14 97 %  08/19/13 1308 131/61 mmHg 97.8 F (36.6 C) - 56 12 97 %  08/19/13 1001 - - - 51 12 100 %  08/19/13 1000 - - - 51 14 100 %  08/19/13 0958 - - - 50 11 100 %  08/19/13 0957 - - - 53 15 100 %  08/19/13 0956 - - - 55 16 100 %  08/19/13 0955 177/67 mmHg - - 57 11 100 %  08/19/13 0950 188/62 mmHg - - 49 12 100 %  08/19/13 0948 - - - 50 13 100 %  08/19/13 0947 195/65 mmHg - - 48 15 100 %  08/19/13 0944 - - - 50 14 100 %  08/19/13 0943 - - - 52 14 100 %  08/19/13 0941 - - - 56 14 100 %  08/19/13 0939 - - - 50 12 100 %  08/19/13 0937 - - - 49 12 100 %  08/19/13 0936 187/66 mmHg - - 55 14 100 %  08/19/13 0935  187/66 mmHg - - 50 11 100 %  08/19/13 0934 - - - 50 12 100 %  08/19/13 0933 - - - 49 11 100 %  08/19/13 0932 - - - 53 13 100 %  08/19/13 0931 204/86 mmHg - - 51 13 100 %  08/19/13 0930 - - - 60 - 100 %  08/19/13 0924 174/70 mmHg 97.3 F (36.3 C) - 50 18 100 %     Recent laboratory studies:  Recent Labs  08/20/13 0545  WBC PENDING  HGB 11.0*  HCT 32.8*  PLT PENDING  NA 135  K 4.8  CL 102  CO2 21  BUN 18  CREATININE 0.63  GLUCOSE 179*  CALCIUM 9.1     Discharge Medications:     Medication List         acetaminophen 325 MG tablet  Commonly known as:  TYLENOL  Take 2 tablets (650 mg total) by mouth every 6 (six) hours as needed for pain or fever.     aspirin 325 MG EC tablet  Take 1 tablet (325 mg total) by mouth daily with breakfast.     bisacodyl 5 MG EC tablet  Commonly known as:  DULCOLAX  Take 2 tablets every night with dinner until bowel movement.  LAXITIVE.  Restart if two days since last bowel movement     celecoxib 200 MG capsule  Commonly known as:  CELEBREX  1 tab po q day with food  for pain and  swelling     DSS 100 MG Caps  1 tab 2 times a day while on narcotics.  STOOL SOFTENER     glipiZIDE 5 MG tablet  Commonly known as:  GLUCOTROL  Take 2.5 mg by mouth daily.     losartan 50 MG tablet  Commonly known as:  COZAAR  Take 50 mg by mouth daily.     metFORMIN 500 MG (MOD) 24 hr tablet  Commonly known as:  GLUMETZA  Take 500 mg by mouth 2 (two) times daily with a meal.     multivitamin with minerals Tabs tablet  Take 1 tablet by mouth daily.     omeprazole 20 MG capsule  Commonly known as:  PRILOSEC  Take 20 mg by mouth daily.     oxyCODONE 5 MG immediate release tablet  Commonly known as:  Oxy IR/ROXICODONE  1-2 tablets every 4-6 hrs as needed for pain     vitamin B-12 500 MCG tablet  Commonly known as:  CYANOCOBALAMIN  Take 500 mcg by mouth daily.        Diagnostic Studies: Dg Chest 2 View  08/12/2013   CLINICAL DATA:  Hypertension. Diabetes. Preoperative for total knee arthroplasty.  EXAM: CHEST  2 VIEW  COMPARISON:  December 27, 2010  FINDINGS: There is no focal infiltrate, pulmonary edema, or pleural effusion. The mediastinal contour and cardiac silhouette are stable. The soft tissues and osseous structures are stable. Patient is status post prior fixation of lower cervical spine. The aorta is tortuous.  IMPRESSION: No active cardiopulmonary disease.   Electronically Signed   By: Sherian Rein   On: 08/12/2013 13:51    Disposition: 06-Home-Health Care Svc      Discharge Orders   Future Orders Complete By Expires   Call MD / Call 911  As directed    Comments:     If you experience chest pain or shortness of breath, CALL 911 and be transported to the hospital emergency room.  If you develope  a fever above 101 F, pus (white drainage) or increased drainage or redness at the wound, or calf pain, call your surgeon's office.   Change dressing  As directed    Comments:     Change the dressing daily with sterile 4 x 4 inch gauze dressing and apply TED  hose.  You may clean the incision with alcohol prior to redressing.   Constipation Prevention  As directed    Comments:     Drink plenty of fluids.  Prune juice may be helpful.  You may use a stool softener, such as Colace (over the counter) 100 mg twice a day.  Use MiraLax (over the counter) for constipation as needed.   CPM  As directed    Comments:     Continuous passive motion machine (CPM):      Use the CPM from 0 to 90 for 6 hours per day.       You may break it up into 2 or 3 sessions per day.      Use CPM for 2 weeks or until you are told to stop.   Diet - low sodium heart healthy  As directed    Discharge instructions  As directed    Comments:     Total Knee Replacement Care After Refer to this sheet in the next few weeks. These discharge instructions provide you with general information on caring for yourself after you leave the hospital. Your caregiver may also give you specific instructions. Your treatment has been planned according to the most current medical practices available, but unavoidable complications sometimes occur. If you have any problems or questions after discharge, please call your caregiver. Regaining a near full range of motion of your knee within the first 3 to 6 weeks after surgery is critical. HOME CARE INSTRUCTIONS  You may resume a normal diet and activities as directed.  Perform exercises as directed.  Place gray foam block, curve side up under heel at all times except when in CPM or when walking.  DO NOT modify, tear, cut, or change in any way the gray foam block. You will receive physical therapy daily  Take showers instead of baths until informed otherwise.  You may shower on Sunday.  Please wash whole leg including wound with soap and water  Change bandages (dressings)daily It is OK to take over-the-counter tylenol in addition to the oxycodone for pain, discomfort, or fever. Oxycodone is VERY constipating.  Please take stool softener twice a day and  laxatives daily until bowels are regular Eat a well-balanced diet.  Avoid lifting or driving until you are instructed otherwise.  Make an appointment to see your caregiver for stitches (suture) or staple removal as directed.  If you have been sent home with a continuous passive motion machine (CPM machine), 0-90 degrees 6 hrs a day   2 hrs a shift SEEK MEDICAL CARE IF: You have swelling of your calf or leg.  You develop shortness of breath or chest pain.  You have redness, swelling, or increasing pain in the wound.  There is pus or any unusual drainage coming from the surgical site.  You notice a bad smell coming from the surgical site or dressing.  The surgical site breaks open after sutures or staples have been removed.  There is persistent bleeding from the suture or staple line.  You are getting worse or are not improving.  You have any other questions or concerns.  SEEK IMMEDIATE MEDICAL CARE IF:  You have  a fever.  You develop a rash.  You have difficulty breathing.  You develop any reaction or side effects to medicines given.  Your knee motion is decreasing rather than improving.  MAKE SURE YOU:  Understand these instructions.  Will watch your condition.  Will get help right away if you are not doing well or get worse.   Do not put a pillow under the knee. Place it under the heel.  As directed    Comments:     Place gray foam block, curve side up under heel at all times except when in CPM or when walking.  DO NOT modify, tear, cut, or change in any way the gray foam block.   Increase activity slowly as tolerated  As directed    TED hose  As directed    Comments:     Use stockings (TED hose) for 2 weeks on both leg(s).  You may remove them at night for sleeping.      Follow-up Information   Follow up with Nilda Simmer, MD On 09/02/2013. (appt time 2 pm)    Specialty:  Orthopedic Surgery   Contact information:   [address] [place]  [address] [phone]        Signed: Pascal Lux 08/20/2013, 8:46 AM

## 2013-08-20 NOTE — Progress Notes (Signed)
08/20/13 Patient set up with HHPT nad HHOT with [facility] by MD office. Spoke with patient, no change in d/c plan. [employer] providing CPM, patient already has rolling walker and 3N1. Jacquelynn Cree RN, BSN, CCM

## 2013-08-20 NOTE — Evaluation (Signed)
Occupational Therapy Evaluation and Discharge Patient Details Name: [name] MRN: [id number] DOB: 07/22/1932 Today's Date: 08/20/2013 Time: 4098-1191 OT Time Calculation (min): 29 min  OT Assessment / Plan / Recommendation History of present illness Patient is a 79 yo female s/p Rt TKA.  Patient with h/o Lt TKA in 2012.   Clinical Impression   This 79 yo female admitted and underwent above. All education completed, acute OT will sign of without need for follow up.       Follow Up Recommendations  No OT follow up       Equipment Recommendations  None recommended by OT          Precautions / Restrictions Precautions Precautions: Knee;Fall Precaution Comments: Reviewed precautions with patient. Required Braces or Orthoses: Knee Immobilizer - Right Knee Immobilizer - Right:  (KI off this session due can straight leg raise<10 lag) Restrictions RLE Weight Bearing: Weight bearing as tolerated       ADL  Tub/Shower Transfer: Supervision/safety Tub/Shower Transfer Method: Science writer: Counsellor Used: Rolling walker;Gait belt Transfers/Ambulation Related to ADLs: S for all wtih RW ADL Comments: setup/S for all UB ADLS, min -mod A for all LBADLs     Acute Rehab OT Goals Patient Stated Goal: To go home today  Visit Information  Last OT Received On: 08/20/13 Assistance Needed: +1 PT/OT Co-Evaluation/Treatment: Yes (partial) History of Present Illness: Patient is a 79 yo female s/p Rt TKA.  Patient with h/o Lt TKA in 2012.       Prior Functioning     Home Living Family/patient expects to be discharged to:: Private residence Living Arrangements: Children;Other relatives Available Help at Discharge: Family;Available 24 hours/day Type of Home: House Home Access: Stairs to enter [employer] of Steps: 2 Entrance Stairs-Rails: None Home Layout: One level Home Equipment: Tub bench;Bedside commode;Wheelchair -  [employer] - 2 wheels;Cane - single point Prior Function Level of Independence: Independent with assistive device(s);Needs assistance Gait / Transfers Assistance Needed: Using walker pta due to RLE pain ADL's / Homemaking Assistance Needed: Assist with meal prep and housekeeping.   Communication Communication: No difficulties Dominant Hand: Right         Vision/Perception Vision - History Patient Visual Report: No change from baseline   Cognition  Cognition Arousal/Alertness: Awake/alert Behavior During Therapy: WFL for tasks assessed/performed Overall Cognitive Status: Within Functional Limits for tasks assessed    Extremity/Trunk Assessment Upper Extremity Assessment Upper Extremity Assessment: Overall WFL for tasks assessed     Mobility Bed Mobility Bed Mobility: Not assessed Transfers Sit to Stand: 4: Min guard;With upper extremity assist;With armrests;From chair/3-in-1 Stand to Sit: 4: Min guard;With upper extremity assist;With armrests;To chair/3-in-1 Details for Transfer Assistance: Guarding for safety.  Pt demonstrated safe technique.             End of Session OT - End of Session Equipment Utilized During Treatment: Gait belt;Rolling walker Activity Tolerance: Patient tolerated treatment well Patient left: in chair;with call bell/phone within reach;with family/visitor present       Evette Georges [phone] 08/20/2013, 12:25 PM

## 2013-08-20 NOTE — Progress Notes (Signed)
D/C instructions and scripts given to Pt. Pts family at bedside to take Pt home.

## 2013-08-20 NOTE — Progress Notes (Signed)
Physical Therapy Treatment Patient Details Name: [name] MRN: [id number] DOB: 02-Oct-1932 Today's Date: 08/20/2013 Time:  -     PT Assessment / Plan / Recommendation  History of Present Illness Patient is a 79 yo female s/p Rt TKA.  Patient with h/o Lt TKA in 2012.   PT Comments   Pt progressing very well with mobility & PT goals.  Required decreased assistance for all aspects of mobility this session.  Increased ambulation distance & completed stair training.  [name] present entire session & was educated on how to assist pt with steps & guarding with ambulation.  Pt safe to d/c home from PT standpoint.     Follow Up Recommendations  Home health PT;Supervision/Assistance - 24 hour     Does the patient have the potential to tolerate intense rehabilitation     Barriers to Discharge        Equipment Recommendations  None recommended by PT    Recommendations for Other Services    Frequency 7X/week   Progress towards PT Goals Progress towards PT goals: Progressing toward goals  Plan Current plan remains appropriate    Precautions / Restrictions Precautions Precautions: Knee;Fall Precaution Comments: Reviewed precautions with patient. Required Braces or Orthoses: Knee Immobilizer - Right Knee Immobilizer - Right:  (KI off this session due can straight leg raise<10 lag) Restrictions RLE Weight Bearing: Weight bearing as tolerated       Mobility  Bed Mobility Bed Mobility: Not assessed Transfers Transfers: Sit to Stand;Stand to Sit Sit to Stand: 4: Min guard;With upper extremity assist;With armrests;From chair/3-in-1 Stand to Sit: 4: Min guard;With upper extremity assist;With armrests;To chair/3-in-1 Details for Transfer Assistance: Guarding for safety.  Pt demonstrated safe technique.   Ambulation/Gait Ambulation/Gait Assistance: 4: Min guard Ambulation Distance (Feet): 300 Feet (100' + 200) Assistive device: Rolling walker Ambulation/Gait Assistance Details: cues to  stay closer to RW, increased heel strike RLE, terminal knee extension during stance phase Gait Pattern: Step-through pattern;Decreased step length - left;Decreased stride length;Decreased weight shift to right;Decreased hip/knee flexion - right General Gait Details: Pt ambulated without KI due to being able to perform SLR's without extension lag.  No knee buckling noted.   Stairs: Yes Stairs Assistance: 4: Min assist Stairs Assistance Details (indicate cue type and reason): (A) for stability & RW management.  Cues for sequencing & technique.  Pt performed 2x's.  Son present & assisted with 2nd trial.  Handout provided for pt.   Stair Management Technique: No rails;Step to pattern;Backwards;With walker Number of Stairs: 2 (2x's. ) Wheelchair Mobility Wheelchair Mobility: No      PT Goals (current goals can now be found in the care plan section) Acute Rehab PT Goals Patient Stated Goal: To go home today PT Goal Formulation: With patient Time For Goal Achievement: 08/26/13 Potential to Achieve Goals: Good  Visit Information  Last PT Received On: 08/20/13 Assistance Needed: +1 History of Present Illness: Patient is a 79 yo female s/p Rt TKA.  Patient with h/o Lt TKA in 2012.    Subjective Data  Patient Stated Goal: To go home today   Cognition  Cognition Arousal/Alertness: Awake/alert Behavior During Therapy: WFL for tasks assessed/performed Overall Cognitive Status: Within Functional Limits for tasks assessed    Balance     End of Session PT - End of Session Equipment Utilized During Treatment: Gait belt Activity Tolerance: Patient tolerated treatment well Patient left:  (in bathroom with OT) Nurse Communication: Mobility status   GP  Lara Mulch 08/20/2013, 12:49 PM  Verdell Face, PTA [phone] 08/20/2013

## 2014-06-26 LAB — LIPID PANEL
CHOLESTEROL: 166 mg/dL (ref 0–200)
HDL: 64 mg/dL (ref 35–70)
LDL CALC: 87 mg/dL
TRIGLYCERIDES: 75 mg/dL (ref 40–160)

## 2014-06-26 LAB — HEMOGLOBIN A1C: HEMOGLOBIN A1C: 6.9 % — AB (ref 4.0–6.0)

## 2014-06-26 LAB — BASIC METABOLIC PANEL
Creatinine: 0.8 mg/dL (ref <=1.1)
Glucose: 65 mg/dL

## 2015-03-16 DIAGNOSIS — M1710 Unilateral primary osteoarthritis, unspecified knee: Secondary | ICD-10-CM | POA: Insufficient documentation

## 2015-03-16 DIAGNOSIS — E119 Type 2 diabetes mellitus without complications: Secondary | ICD-10-CM | POA: Insufficient documentation

## 2015-03-16 DIAGNOSIS — J329 Chronic sinusitis, unspecified: Secondary | ICD-10-CM | POA: Insufficient documentation

## 2015-03-16 DIAGNOSIS — M179 Osteoarthritis of knee, unspecified: Secondary | ICD-10-CM | POA: Insufficient documentation

## 2015-03-16 DIAGNOSIS — E7849 Other hyperlipidemia: Secondary | ICD-10-CM | POA: Insufficient documentation

## 2015-03-17 DIAGNOSIS — E119 Type 2 diabetes mellitus without complications: Secondary | ICD-10-CM | POA: Diagnosis not present

## 2015-03-17 DIAGNOSIS — K219 Gastro-esophageal reflux disease without esophagitis: Secondary | ICD-10-CM | POA: Diagnosis not present

## 2015-03-17 DIAGNOSIS — E784 Other hyperlipidemia: Secondary | ICD-10-CM | POA: Diagnosis not present

## 2015-03-17 DIAGNOSIS — I10 Essential (primary) hypertension: Secondary | ICD-10-CM | POA: Diagnosis not present

## 2015-03-17 DIAGNOSIS — M170 Bilateral primary osteoarthritis of knee: Secondary | ICD-10-CM | POA: Diagnosis not present

## 2015-08-13 ENCOUNTER — Other Ambulatory Visit: Payer: Self-pay | Admitting: Family Medicine

## 2015-08-13 DIAGNOSIS — E119 Type 2 diabetes mellitus without complications: Secondary | ICD-10-CM

## 2015-09-16 ENCOUNTER — Other Ambulatory Visit: Payer: Self-pay

## 2015-09-16 DIAGNOSIS — E119 Type 2 diabetes mellitus without complications: Secondary | ICD-10-CM

## 2015-09-16 DIAGNOSIS — I10 Essential (primary) hypertension: Secondary | ICD-10-CM

## 2015-09-16 MED ORDER — METFORMIN HCL ER (MOD) 500 MG PO TB24: 500.0000 mg | ORAL_TABLET | Freq: Two times a day (BID) | ORAL | Status: DC

## 2015-09-16 MED ORDER — LOSARTAN POTASSIUM 50 MG PO TABS: 50.0000 mg | ORAL_TABLET | Freq: Every day | ORAL | Status: DC

## 2015-09-24 ENCOUNTER — Encounter: Payer: Self-pay | Admitting: Family Medicine

## 2015-09-24 ENCOUNTER — Ambulatory Visit (INDEPENDENT_AMBULATORY_CARE_PROVIDER_SITE_OTHER): Payer: Medicare Other | Admitting: Family Medicine

## 2015-09-24 VITALS — BP 124/62 | HR 78 | Ht 65.0 in | Wt 198.0 lb

## 2015-09-24 DIAGNOSIS — K219 Gastro-esophageal reflux disease without esophagitis: Secondary | ICD-10-CM

## 2015-09-24 DIAGNOSIS — Z23 Encounter for immunization: Secondary | ICD-10-CM

## 2015-09-24 DIAGNOSIS — E785 Hyperlipidemia, unspecified: Secondary | ICD-10-CM | POA: Insufficient documentation

## 2015-09-24 DIAGNOSIS — I10 Essential (primary) hypertension: Secondary | ICD-10-CM | POA: Diagnosis not present

## 2015-09-24 DIAGNOSIS — M170 Bilateral primary osteoarthritis of knee: Secondary | ICD-10-CM

## 2015-09-24 DIAGNOSIS — E119 Type 2 diabetes mellitus without complications: Secondary | ICD-10-CM

## 2015-09-24 MED ORDER — ETODOLAC 400 MG PO TABS: 400.0000 mg | ORAL_TABLET | Freq: Every day | ORAL | Status: DC

## 2015-09-24 MED ORDER — LOSARTAN POTASSIUM 50 MG PO TABS: 50.0000 mg | ORAL_TABLET | Freq: Every day | ORAL | Status: DC

## 2015-09-24 MED ORDER — OMEPRAZOLE 20 MG PO CPDR: 20.0000 mg | DELAYED_RELEASE_CAPSULE | Freq: Every day | ORAL | Status: DC

## 2015-09-24 MED ORDER — METFORMIN HCL 500 MG PO TABS: 500.0000 mg | ORAL_TABLET | Freq: Two times a day (BID) | ORAL | Status: DC

## 2015-09-24 MED ORDER — GLIPIZIDE 5 MG PO TABS: 2.5000 mg | ORAL_TABLET | Freq: Every day | ORAL | Status: DC

## 2015-09-24 NOTE — Progress Notes (Signed)
Name: [name]   MRN: [id number]    DOB: 02-Jun-1932   Date:09/24/2015       Progress Note  Subjective  Chief Complaint  Chief Complaint  Patient presents with  . Diabetes  . Gastroesophageal Reflux  . Hypertension  . Arthritis    Diabetes She presents for her follow-up diabetic visit. She has type 2 diabetes mellitus. Her disease course has been stable. There are no hypoglycemic associated symptoms. Pertinent negatives for hypoglycemia include no confusion, dizziness, headaches, hunger, mood changes, nervousness/anxiousness, pallor, seizures, sleepiness, speech difficulty, sweats or tremors. Pertinent negatives for diabetes include no blurred vision, no chest pain, no fatigue, no foot paresthesias, no foot ulcerations, no polydipsia, no polyphagia, no polyuria, no visual change, no weakness and no weight loss. There are no hypoglycemic complications. Symptoms are stable. Pertinent negatives for diabetic complications include no autonomic neuropathy, CVA, heart disease, impotence, nephropathy, peripheral neuropathy, PVD or retinopathy. Risk factors for coronary artery disease include diabetes mellitus. Current diabetic treatment includes oral agent (monotherapy) and oral agent (dual therapy). She is compliant with treatment all of the time. Her weight is stable. She is following a generally healthy diet. She participates in exercise intermittently. Her breakfast blood glucose is taken between 8-9 am. Her breakfast blood glucose range is generally 110-130 mg/dl. An ACE inhibitor/angiotensin II receptor blocker is being taken. She does not see a podiatrist.Eye exam is not current.  Gastroesophageal Reflux She reports no abdominal pain, no belching, no chest pain, no choking, no coughing, no dysphagia, no early satiety, no globus sensation, no heartburn, no hoarse voice, no nausea, no sore throat, no stridor, no tooth decay, no water brash or no wheezing. This is a chronic problem. The current  episode started more than 1 year ago. The problem occurs occasionally. The problem has been gradually improving. The symptoms are aggravated by certain foods. Pertinent negatives include no anemia, fatigue, melena, muscle weakness, orthopnea or weight loss. Risk factors include NSAIDs and obesity. She has tried a PPI for the symptoms. The treatment provided moderate relief.  Hypertension This is a chronic problem. The current episode started more than 1 year ago. The problem has been gradually improving since onset. Pertinent negatives include no anxiety, blurred vision, chest pain, headaches, malaise/fatigue, neck pain, orthopnea, palpitations, peripheral edema, PND, shortness of breath or sweats. Agents associated with hypertension include NSAIDs. Risk factors for coronary artery disease include dyslipidemia and obesity. Past treatments include angiotensin blockers. There are no compliance problems.  There is no history of angina, kidney disease, CAD/MI, CVA, heart failure, left ventricular hypertrophy, PVD, renovascular disease or retinopathy. There is no history of chronic renal disease.  Arthritis Presents for follow-up visit. She reports no pain, stiffness, joint swelling or joint warmth. Pertinent negatives include no diarrhea, dysuria, fatigue, fever, rash or weight loss. Her past medical history is significant for osteoarthritis. There is no history of chronic back pain.  Past treatments include acetaminophen and NSAIDs. The treatment provided moderate relief. Factors aggravating her arthritis include climbing stairs. Compliance with prior treatments has been good.    No problem-specific assessment & plan notes found for this encounter.   Past Medical History  Diagnosis Date  . Hypertension   . GERD (gastroesophageal reflux disease)   . Type II diabetes mellitus (HCC)   . Right knee DJD     Past Surgical History  Procedure Laterality Date  . Total knee arthroplasty Left 03/28/2011    Dr  Thurston Hole  . Cervical disc surgery  12/28/2010    "bulging disc; went in on the left side of my neck" (08/19/2013)  . Total knee arthroplasty Right 08/19/2013  . Joint replacement    . Back surgery    . Total knee arthroplasty Right 08/19/2013    Procedure: TOTAL KNEE ARTHROPLASTY;  Surgeon: Nilda Simmerobert A Wainer, MD;  [facility];  Service: Orthopedics;  Laterality: Right;    Family History  Problem Relation Age of Onset  . Diabetes Mother     Social History   Social History  . Marital Status: Widowed    Spouse Name: N/A  . Number of Children: N/A  . Years of Education: N/A   Occupational History  . Not on file.   Social History Main Topics  . Smoking status: Never Smoker   . Smokeless tobacco: Never Used  . Alcohol Use: No  . Drug Use: No  . Sexual Activity: No   Other Topics Concern  . Not on file   Social History Narrative    Allergies  Allergen Reactions  . Nsaids      Review of Systems  Constitutional: Negative for fever, chills, weight loss, malaise/fatigue and fatigue.  HENT: Negative for ear discharge, ear pain, hoarse voice and sore throat.   Eyes: Negative for blurred vision.  Respiratory: Negative for cough, sputum production, choking, shortness of breath and wheezing.   Cardiovascular: Negative for chest pain, palpitations, orthopnea, leg swelling and PND.  Gastrointestinal: Negative for heartburn, dysphagia, nausea, abdominal pain, diarrhea, constipation, blood in stool and melena.  Genitourinary: Negative for dysuria, urgency, frequency, hematuria and impotence.  Musculoskeletal: Positive for arthritis. Negative for myalgias, back pain, joint pain, joint swelling, stiffness, muscle weakness and neck pain.  Skin: Negative for pallor and rash.  Neurological: Negative for dizziness, tingling, tremors, sensory change, focal weakness, seizures, speech difficulty, weakness and headaches.  Endo/Heme/Allergies: Negative for environmental allergies, polydipsia and  polyphagia. Does not bruise/bleed easily.  Psychiatric/Behavioral: Negative for depression, suicidal ideas and confusion. The patient is not nervous/anxious and does not have insomnia.      Objective  Filed Vitals:   09/24/15 1031  BP: 124/62  Pulse: 78  Height: 5\' 5"  (1.651 m)  Weight: 198 lb (89.812 kg)    Physical Exam  Constitutional: She is well-developed, well-nourished, and in no distress. No distress.  HENT:  Head: Normocephalic and atraumatic.  Right Ear: External ear normal.  Left Ear: External ear normal.  Nose: Nose normal.  Mouth/Throat: Oropharynx is clear and moist.  Eyes: Conjunctivae and EOM are normal. Pupils are equal, round, and reactive to light. Right eye exhibits no discharge. Left eye exhibits no discharge.  Neck: Normal range of motion. Neck supple. No JVD present. No thyromegaly present.  Cardiovascular: Normal rate, regular rhythm, normal heart sounds and intact distal pulses.  Exam reveals no gallop and no friction rub.   No murmur heard. Pulmonary/Chest: Effort normal and breath sounds normal.  Abdominal: Soft. Bowel sounds are normal. She exhibits no mass. There is no tenderness. There is no guarding.  Musculoskeletal: Normal range of motion. She exhibits no edema.  Lymphadenopathy:    She has no cervical adenopathy.  Neurological: She is alert. She has normal reflexes.  Skin: Skin is warm and dry. She is not diaphoretic.  Psychiatric: Mood and affect normal.      Assessment & Plan  Problem List Items Addressed This Visit      Cardiovascular and Mediastinum   Hypertension - Primary   Relevant Medications   losartan (  COZAAR) 50 MG tablet   Other Relevant Orders   Renal Function Panel     Digestive   GERD (gastroesophageal reflux disease)   Relevant Medications   omeprazole (PRILOSEC) 20 MG capsule     Endocrine   Diabetes (HCC)   Relevant Medications   glipiZIDE (GLUCOTROL) 5 MG tablet   losartan (COZAAR) 50 MG tablet    metFORMIN (GLUCOPHAGE) 500 MG tablet   Other Relevant Orders   HgB A1c   Urine Microalbumin w/creat. ratio     Musculoskeletal and Integument   Arthritis of knee, degenerative   Relevant Medications   etodolac (LODINE) 400 MG tablet     Other   Hyperlipidemia   Relevant Medications   losartan (COZAAR) 50 MG tablet   Other Relevant Orders   Lipid Profile    Other Visit Diagnoses    Need for influenza vaccination        Need for pneumococcal vaccination        Relevant Orders    Pneumococcal conjugate vaccine 13-valent (Completed)         Dr. Hayden [facility]  09/24/2015

## 2015-09-25 LAB — RENAL FUNCTION PANEL
ALBUMIN: 4 g/dL (ref 3.5–4.7)
BUN/Creatinine Ratio: 21 (ref 11–26)
BUN: 15 mg/dL (ref 8–27)
CO2: 23 mmol/L (ref 18–29)
CREATININE: 0.7 mg/dL (ref 0.57–1.00)
Calcium: 9.5 mg/dL (ref 8.7–10.3)
Chloride: 101 mmol/L (ref 97–106)
GFR calc Af Amer: 94 mL/min/{1.73_m2} (ref >59)
GFR calc non Af Amer: 82 mL/min/{1.73_m2} (ref >59)
Glucose: 119 mg/dL — ABNORMAL HIGH (ref 65–99)
PHOSPHORUS: 4.3 mg/dL (ref 2.5–4.5)
Potassium: 5.2 mmol/L (ref 3.5–5.2)
SODIUM: 139 mmol/L (ref 136–144)

## 2015-09-25 LAB — LIPID PANEL
CHOLESTEROL TOTAL: 193 mg/dL (ref 100–199)
Chol/HDL Ratio: 2.6 ratio units (ref 0.0–4.4)
HDL: 73 mg/dL (ref >39)
LDL CALC: 110 mg/dL — AB (ref 0–99)
TRIGLYCERIDES: 52 mg/dL (ref 0–149)
VLDL Cholesterol Cal: 10 mg/dL (ref 5–40)

## 2015-09-25 LAB — MICROALBUMIN / CREATININE URINE RATIO
Creatinine, Urine: 66.8 mg/dL
MICROALB/CREAT RATIO: 9.4 mg/g creat (ref 0.0–30.0)
MICROALBUM., U, RANDOM: 6.3 ug/mL

## 2015-09-25 LAB — HEMOGLOBIN A1C
ESTIMATED AVERAGE GLUCOSE: 137 mg/dL
Hgb A1c MFr Bld: 6.4 % — ABNORMAL HIGH (ref 4.8–5.6)

## 2016-01-26 DIAGNOSIS — E113393 Type 2 diabetes mellitus with moderate nonproliferative diabetic retinopathy without macular edema, bilateral: Secondary | ICD-10-CM | POA: Diagnosis not present

## 2016-02-12 DIAGNOSIS — H2513 Age-related nuclear cataract, bilateral: Secondary | ICD-10-CM | POA: Diagnosis not present

## 2016-02-12 DIAGNOSIS — H18413 Arcus senilis, bilateral: Secondary | ICD-10-CM | POA: Diagnosis not present

## 2016-02-12 DIAGNOSIS — E119 Type 2 diabetes mellitus without complications: Secondary | ICD-10-CM | POA: Diagnosis not present

## 2016-03-08 DIAGNOSIS — H2511 Age-related nuclear cataract, right eye: Secondary | ICD-10-CM | POA: Diagnosis not present

## 2016-03-21 HISTORY — PX: CATARACT EXTRACTION: SUR2

## 2016-03-22 ENCOUNTER — Other Ambulatory Visit: Payer: Self-pay

## 2016-03-22 DIAGNOSIS — E119 Type 2 diabetes mellitus without complications: Secondary | ICD-10-CM

## 2016-03-22 MED ORDER — METFORMIN HCL 500 MG PO TABS: 500.0000 mg | ORAL_TABLET | Freq: Two times a day (BID) | ORAL | Status: DC

## 2016-03-28 DIAGNOSIS — H25811 Combined forms of age-related cataract, right eye: Secondary | ICD-10-CM | POA: Diagnosis not present

## 2016-03-28 DIAGNOSIS — H2511 Age-related nuclear cataract, right eye: Secondary | ICD-10-CM | POA: Diagnosis not present

## 2016-03-28 DIAGNOSIS — Z961 Presence of intraocular lens: Secondary | ICD-10-CM | POA: Diagnosis not present

## 2016-03-29 DIAGNOSIS — H2512 Age-related nuclear cataract, left eye: Secondary | ICD-10-CM | POA: Diagnosis not present

## 2016-04-12 ENCOUNTER — Other Ambulatory Visit: Payer: Self-pay

## 2016-04-12 DIAGNOSIS — I10 Essential (primary) hypertension: Secondary | ICD-10-CM

## 2016-04-12 DIAGNOSIS — K219 Gastro-esophageal reflux disease without esophagitis: Secondary | ICD-10-CM

## 2016-04-12 DIAGNOSIS — E119 Type 2 diabetes mellitus without complications: Secondary | ICD-10-CM

## 2016-04-12 MED ORDER — METFORMIN HCL 500 MG PO TABS: 500.0000 mg | ORAL_TABLET | Freq: Two times a day (BID) | ORAL | Status: DC

## 2016-04-12 MED ORDER — LOSARTAN POTASSIUM 50 MG PO TABS: 50.0000 mg | ORAL_TABLET | Freq: Every day | ORAL | Status: DC

## 2016-04-12 MED ORDER — GLIPIZIDE 5 MG PO TABS: 2.5000 mg | ORAL_TABLET | Freq: Every day | ORAL | Status: DC

## 2016-04-12 MED ORDER — OMEPRAZOLE 20 MG PO CPDR: 20.0000 mg | DELAYED_RELEASE_CAPSULE | Freq: Every day | ORAL | Status: DC

## 2016-04-18 DIAGNOSIS — H2512 Age-related nuclear cataract, left eye: Secondary | ICD-10-CM | POA: Diagnosis not present

## 2016-04-18 DIAGNOSIS — Z961 Presence of intraocular lens: Secondary | ICD-10-CM | POA: Diagnosis not present

## 2016-04-18 DIAGNOSIS — H25812 Combined forms of age-related cataract, left eye: Secondary | ICD-10-CM | POA: Diagnosis not present

## 2016-05-10 ENCOUNTER — Encounter: Payer: Self-pay | Admitting: Family Medicine

## 2016-05-10 ENCOUNTER — Ambulatory Visit (INDEPENDENT_AMBULATORY_CARE_PROVIDER_SITE_OTHER): Payer: Medicare Other | Admitting: Family Medicine

## 2016-05-10 VITALS — BP 120/62 | HR 78 | Ht 65.0 in | Wt 195.0 lb

## 2016-05-10 DIAGNOSIS — E784 Other hyperlipidemia: Secondary | ICD-10-CM

## 2016-05-10 DIAGNOSIS — E119 Type 2 diabetes mellitus without complications: Secondary | ICD-10-CM

## 2016-05-10 DIAGNOSIS — K219 Gastro-esophageal reflux disease without esophagitis: Secondary | ICD-10-CM

## 2016-05-10 DIAGNOSIS — Z23 Encounter for immunization: Secondary | ICD-10-CM | POA: Diagnosis not present

## 2016-05-10 DIAGNOSIS — E7849 Other hyperlipidemia: Secondary | ICD-10-CM

## 2016-05-10 DIAGNOSIS — I10 Essential (primary) hypertension: Secondary | ICD-10-CM | POA: Diagnosis not present

## 2016-05-10 MED ORDER — OMEPRAZOLE 20 MG PO CPDR: 20.0000 mg | DELAYED_RELEASE_CAPSULE | Freq: Every day | ORAL | Status: DC

## 2016-05-10 MED ORDER — GLIPIZIDE 5 MG PO TABS: 2.5000 mg | ORAL_TABLET | Freq: Every day | ORAL | Status: DC

## 2016-05-10 MED ORDER — METFORMIN HCL 500 MG PO TABS
500.0000 mg | ORAL_TABLET | Freq: Two times a day (BID) | ORAL | Status: DC
Start: 2016-05-10 — End: 2016-11-17

## 2016-05-10 MED ORDER — LOSARTAN POTASSIUM 50 MG PO TABS: 50.0000 mg | ORAL_TABLET | Freq: Every day | ORAL | Status: DC

## 2016-05-10 NOTE — Progress Notes (Signed)
Name: [name]   MRN: [id number]    DOB: October 26, 1932   Date:05/10/2016       Progress Note  Subjective  Chief Complaint  Chief Complaint  Patient presents with  . Hypertension  . Gastroesophageal Reflux  . Diabetes    Hypertension This is a chronic problem. The current episode started more than 1 year ago. The problem has been gradually improving since onset. The problem is controlled. Pertinent negatives include no anxiety, blurred vision, chest pain, headaches, malaise/fatigue, neck pain, orthopnea, palpitations, peripheral edema, PND, shortness of breath or sweats. There are no associated agents to hypertension. There are no known risk factors for coronary artery disease. Past treatments include angiotensin blockers. The current treatment provides no improvement. There are no compliance problems.  There is no history of angina, kidney disease, CAD/MI, CVA, heart failure, left ventricular hypertrophy, PVD, renovascular disease or retinopathy. There is no history of chronic renal disease or a hypertension causing med.  Gastroesophageal Reflux She reports no abdominal pain, no belching, no chest pain, no choking, no coughing, no dysphagia, no early satiety, no heartburn, no nausea, no sore throat or no wheezing. This is a chronic problem. The current episode started more than 1 year ago. The problem occurs rarely. The problem has been gradually improving. The symptoms are aggravated by certain foods. Pertinent negatives include no melena or weight loss. She has tried a PPI for the symptoms. The treatment provided mild relief.  Diabetes She presents for her follow-up diabetic visit. She has type 2 diabetes mellitus. Pertinent negatives for hypoglycemia include no dizziness, headaches, nervousness/anxiousness or sweats. Pertinent negatives for diabetes include no blurred vision, no chest pain, no polydipsia and no weight loss. There are no hypoglycemic complications. Symptoms are stable. There  are no diabetic complications. Pertinent negatives for diabetic complications include no CVA, PVD or retinopathy. Risk factors for coronary artery disease include hypertension. Current diabetic treatment includes oral agent (dual therapy). She is following a generally healthy diet. She participates in exercise daily. Her home blood glucose trend is fluctuating minimally. Her breakfast blood glucose is taken between 8-9 am. Her breakfast blood glucose range is generally 90-110 mg/dl. An ACE inhibitor/angiotensin II receptor blocker is being taken. She does not see a podiatrist.Eye exam is current.    No problem-specific assessment & plan notes found for this encounter.   Past Medical History  Diagnosis Date  . Hypertension   . GERD (gastroesophageal reflux disease)   . Type II diabetes mellitus (HCC)   . Right knee DJD     Past Surgical History  Procedure Laterality Date  . Total knee arthroplasty Left 03/28/2011    Dr Thurston Hole  . Cervical disc surgery  12/28/2010    "bulging disc; went in on the left side of my neck" (08/19/2013)  . Total knee arthroplasty Right 08/19/2013  . Joint replacement    . Back surgery    . Total knee arthroplasty Right 08/19/2013    Procedure: TOTAL KNEE ARTHROPLASTY;  Surgeon: Nilda Simmer, MD;  Location: [facility];  Service: Orthopedics;  Laterality: Right;  . Cataract extraction Bilateral 03/2016    Family History  Problem Relation Age of Onset  . Diabetes Mother     Social History   Social History  . Marital Status: Widowed    Spouse Name: N/A  . Number of Children: N/A  . Years of Education: N/A   Occupational History  . Not on file.   Social History Main Topics  .  Smoking status: Never Smoker   . Smokeless tobacco: Never Used  . Alcohol Use: No  . Drug Use: No  . Sexual Activity: No   Other Topics Concern  . Not on file   Social History Narrative    Allergies  Allergen Reactions  . Nsaids      Review of Systems  Constitutional:  Negative for fever, chills, weight loss and malaise/fatigue.  HENT: Negative for ear discharge, ear pain and sore throat.   Eyes: Negative for blurred vision.  Respiratory: Negative for cough, sputum production, choking, shortness of breath and wheezing.   Cardiovascular: Negative for chest pain, palpitations, orthopnea, leg swelling and PND.  Gastrointestinal: Negative for heartburn, dysphagia, nausea, abdominal pain, diarrhea, constipation, blood in stool and melena.  Genitourinary: Negative for dysuria, urgency, frequency and hematuria.  Musculoskeletal: Negative for myalgias, back pain, joint pain and neck pain.  Skin: Negative for rash.  Neurological: Negative for dizziness, tingling, sensory change, focal weakness and headaches.  Endo/Heme/Allergies: Negative for environmental allergies and polydipsia. Does not bruise/bleed easily.  Psychiatric/Behavioral: Negative for depression and suicidal ideas. The patient is not nervous/anxious and does not have insomnia.      Objective  Filed Vitals:   05/10/16 1023  BP: 120/62  Pulse: 78  Height:  (1.651 m)  Weight: 195 lb (88.451 kg)    Physical Exam  Constitutional: She is well-developed, well-nourished, and in no distress. No distress.  HENT:  Head: Normocephalic and atraumatic.  Right Ear: External ear normal.  Left Ear: External ear normal.  Nose: Nose normal.  Mouth/Throat: Oropharynx is clear and moist.  Eyes: Conjunctivae and EOM are normal. Pupils are equal, round, and reactive to light. Right eye exhibits no discharge. Left eye exhibits no discharge.  Neck: Normal range of motion. Neck supple. No JVD present. No thyromegaly present.  Cardiovascular: Normal rate, regular rhythm, normal heart sounds, intact distal pulses and normal pulses.  Exam reveals no gallop and no friction rub.   No murmur heard. Pulmonary/Chest: Effort normal and breath sounds normal. She has no wheezes. She has no rales.  Abdominal: Soft. Bowel  sounds are normal. She exhibits no mass. There is no tenderness. There is no guarding.  Musculoskeletal: Normal range of motion. She exhibits no edema.  Lymphadenopathy:    She has no cervical adenopathy.  Neurological: She is alert. She has normal motor skills, normal sensation and normal reflexes.  Monofilament normal  Skin: Skin is warm, dry and intact. She is not diaphoretic.  Feet normal  Psychiatric: Mood and affect normal.  Nursing note and vitals reviewed.     Assessment & Plan  Problem List Items Addressed This Visit      Cardiovascular and Mediastinum   Hypertension - Primary   Relevant Medications   losartan (COZAAR) 50 MG tablet   Other Relevant Orders   Basic metabolic panel     Digestive   GERD (gastroesophageal reflux disease)   Relevant Medications   omeprazole (PRILOSEC) 20 MG capsule     Endocrine   Diabetes (HCC)   Relevant Medications   losartan (COZAAR) 50 MG tablet   glipiZIDE (GLUCOTROL) 5 MG tablet   metFORMIN (GLUCOPHAGE) 500 MG tablet   Diabetes mellitus with no complication (HCC)   Relevant Medications   losartan (COZAAR) 50 MG tablet   glipiZIDE (GLUCOTROL) 5 MG tablet   metFORMIN (GLUCOPHAGE) 500 MG tablet   Other Relevant Orders   Hemoglobin A1c   Microalbumin / creatinine urine ratio  Other   Familial multiple lipoprotein-type hyperlipidemia   Relevant Medications   losartan (COZAAR) 50 MG tablet   Other Relevant Orders   Lipid Profile    Other Visit Diagnoses    Need for Tdap vaccination        Relevant Orders    Tdap vaccine greater than or equal to 7yo IM (Completed)         Dr. Hayden Rasmusseneanna Bayard [facility]  05/10/2016

## 2016-05-11 LAB — LIPID PANEL
CHOL/HDL RATIO: 2.5 ratio (ref 0.0–4.4)
Cholesterol, Total: 206 mg/dL — ABNORMAL HIGH (ref 100–199)
HDL: 84 mg/dL (ref >39)
LDL Calculated: 110 mg/dL — ABNORMAL HIGH (ref 0–99)
Triglycerides: 59 mg/dL (ref 0–149)
VLDL Cholesterol Cal: 12 mg/dL (ref 5–40)

## 2016-05-11 LAB — BASIC METABOLIC PANEL
BUN / CREAT RATIO: 29 — AB (ref 12–28)
BUN: 21 mg/dL (ref 8–27)
CALCIUM: 9.9 mg/dL (ref 8.7–10.3)
CO2: 19 mmol/L (ref 18–29)
Chloride: 102 mmol/L (ref 96–106)
Creatinine, Ser: 0.72 mg/dL (ref 0.57–1.00)
GFR, EST AFRICAN AMERICAN: 90 mL/min/{1.73_m2} (ref >59)
GFR, EST NON AFRICAN AMERICAN: 78 mL/min/{1.73_m2} (ref >59)
GLUCOSE: 53 mg/dL — AB (ref 65–99)
POTASSIUM: 5.5 mmol/L — AB (ref 3.5–5.2)
Sodium: 140 mmol/L (ref 134–144)

## 2016-05-11 LAB — MICROALBUMIN / CREATININE URINE RATIO
Creatinine, Urine: 58.1 mg/dL
MICROALB/CREAT RATIO: 22.9 mg/g creat (ref 0.0–30.0)
MICROALBUM., U, RANDOM: 13.3 ug/mL

## 2016-05-11 LAB — HEMOGLOBIN A1C
ESTIMATED AVERAGE GLUCOSE: 126 mg/dL
HEMOGLOBIN A1C: 6 % — AB (ref 4.8–5.6)

## 2016-05-12 NOTE — Addendum Note (Signed)
Addended by: Everitt AmberLYNCH, Jaidin Ugarte L on: 05/12/2016 12:19 PM   Modules accepted: Orders, Medications

## 2016-06-16 ENCOUNTER — Other Ambulatory Visit: Payer: Self-pay | Admitting: Family Medicine

## 2016-06-16 DIAGNOSIS — E119 Type 2 diabetes mellitus without complications: Secondary | ICD-10-CM

## 2016-11-17 ENCOUNTER — Ambulatory Visit (INDEPENDENT_AMBULATORY_CARE_PROVIDER_SITE_OTHER): Payer: Medicare Other | Admitting: Family Medicine

## 2016-11-17 ENCOUNTER — Encounter: Payer: Self-pay | Admitting: Family Medicine

## 2016-11-17 VITALS — BP 130/80 | HR 80 | Ht 65.0 in | Wt 198.0 lb

## 2016-11-17 DIAGNOSIS — E119 Type 2 diabetes mellitus without complications: Secondary | ICD-10-CM | POA: Diagnosis not present

## 2016-11-17 DIAGNOSIS — H6123 Impacted cerumen, bilateral: Secondary | ICD-10-CM | POA: Diagnosis not present

## 2016-11-17 DIAGNOSIS — K219 Gastro-esophageal reflux disease without esophagitis: Secondary | ICD-10-CM | POA: Diagnosis not present

## 2016-11-17 DIAGNOSIS — I10 Essential (primary) hypertension: Secondary | ICD-10-CM

## 2016-11-17 DIAGNOSIS — E784 Other hyperlipidemia: Secondary | ICD-10-CM

## 2016-11-17 DIAGNOSIS — E7849 Other hyperlipidemia: Secondary | ICD-10-CM

## 2016-11-17 MED ORDER — CARBAMIDE PEROXIDE 6.5 % OT SOLN: 5.0000 [drp] | Freq: Once | OTIC | Status: DC

## 2016-11-17 MED ORDER — OMEPRAZOLE 20 MG PO CPDR: 20.0000 mg | DELAYED_RELEASE_CAPSULE | Freq: Every day | ORAL | 5 refills | Status: DC

## 2016-11-17 MED ORDER — LOSARTAN POTASSIUM 50 MG PO TABS: 50.0000 mg | ORAL_TABLET | Freq: Every day | ORAL | 5 refills | Status: DC

## 2016-11-17 MED ORDER — METFORMIN HCL 500 MG PO TABS
500.0000 mg | ORAL_TABLET | Freq: Two times a day (BID) | ORAL | 5 refills | Status: DC
Start: 2016-11-17 — End: 2017-05-22

## 2016-11-17 NOTE — Progress Notes (Signed)
Name: [name]   MRN: [id number]    DOB: Dec 11, 1931   Date:11/17/2016       Progress Note  Subjective  Chief Complaint  Chief Complaint  Patient presents with  . Diabetes  . Hypertension  . Gastroesophageal Reflux    Diabetes  She presents for her follow-up diabetic visit. She has type 2 diabetes mellitus. Her disease course has been stable. There are no hypoglycemic associated symptoms. Pertinent negatives for hypoglycemia include no dizziness, headaches, nervousness/anxiousness or sweats. Pertinent negatives for diabetes include no blurred vision, no chest pain, no fatigue, no foot paresthesias, no foot ulcerations, no polydipsia, no polyphagia, no polyuria, no visual change, no weakness and no weight loss. There are no hypoglycemic complications. Symptoms are stable. There are no diabetic complications. Pertinent negatives for diabetic complications include no CVA, PVD or retinopathy. There are no known risk factors for coronary artery disease. She is compliant with treatment all of the time. Her weight is stable. She is following a generally healthy diet. She participates in exercise daily. Her home blood glucose trend is fluctuating minimally. Her breakfast blood glucose is taken between 8-9 am. Her breakfast blood glucose range is generally 110-130 mg/dl. An ACE inhibitor/angiotensin II receptor blocker is being taken. She does not see a podiatrist.Eye exam is current.  Hypertension  This is a chronic problem. The current episode started more than 1 year ago. The problem has been gradually improving since onset. The problem is controlled. Pertinent negatives include no anxiety, blurred vision, chest pain, headaches, malaise/fatigue, neck pain, orthopnea, palpitations, peripheral edema, PND, shortness of breath or sweats. There are no associated agents to hypertension. Risk factors for coronary artery disease include diabetes mellitus, dyslipidemia and obesity. Past treatments include  angiotensin blockers. The current treatment provides mild improvement. There are no compliance problems.  There is no history of angina, kidney disease, CAD/MI, CVA, heart failure, left ventricular hypertrophy, PVD, renovascular disease or retinopathy. There is no history of chronic renal disease or a hypertension causing med.  Gastroesophageal Reflux  She reports no abdominal pain, no belching, no chest pain, no coughing, no dysphagia, no early satiety, no globus sensation, no heartburn, no nausea, no sore throat, no stridor or no wheezing. This is a recurrent problem. The problem has been waxing and waning. The symptoms are aggravated by certain foods. Pertinent negatives include no fatigue, melena or weight loss. There are no known risk factors. She has tried a PPI for the symptoms. The treatment provided mild relief.  Hyperlipidemia  This is a chronic problem. The problem is controlled. Recent lipid tests were reviewed and are normal. She has no history of chronic renal disease. There are no known factors aggravating her hyperlipidemia. Pertinent negatives include no chest pain, focal weakness, myalgias or shortness of breath. Current antihyperlipidemic treatment includes diet change. The current treatment provides mild improvement of lipids.    No problem-specific Assessment & Plan notes found for this encounter.   Past Medical History:  Diagnosis Date  . GERD (gastroesophageal reflux disease)   . Hypertension   . Right knee DJD   . Type II diabetes mellitus (HCC)     Past Surgical History:  Procedure Laterality Date  . BACK SURGERY    . CATARACT EXTRACTION Bilateral 03/2016  . CERVICAL DISC SURGERY  12/28/2010   "bulging disc; went in on the left side of my neck" (08/19/2013)  . JOINT REPLACEMENT    . TOTAL KNEE ARTHROPLASTY Left 03/28/2011   Dr Thurston Hole  .  TOTAL KNEE ARTHROPLASTY Right 08/19/2013  . TOTAL KNEE ARTHROPLASTY Right 08/19/2013   Procedure: TOTAL KNEE ARTHROPLASTY;  Surgeon:  Nilda Simmerobert A Wainer, MD;  Location: [facility] OR;  Service: Orthopedics;  Laterality: Right;    Family History  Problem Relation Age of Onset  . Diabetes Mother     Social History   Social History  . Marital status: Widowed    Spouse name: N/A  . Number of children: N/A  . Years of education: N/A   Occupational History  . Not on file.   Social History Main Topics  . Smoking status: Never Smoker  . Smokeless tobacco: Never Used  . Alcohol use No  . Drug use: No  . Sexual activity: No   Other Topics Concern  . Not on file   Social History Narrative  . No narrative on file    Allergies  Allergen Reactions  . Nsaids      Review of Systems  Constitutional: Negative for chills, fatigue, fever, malaise/fatigue and weight loss.  HENT: Negative for ear discharge, ear pain and sore throat.   Eyes: Negative for blurred vision.  Respiratory: Negative for cough, sputum production, shortness of breath and wheezing.   Cardiovascular: Negative for chest pain, palpitations, orthopnea, leg swelling and PND.  Gastrointestinal: Negative for abdominal pain, blood in stool, constipation, diarrhea, dysphagia, heartburn, melena and nausea.  Genitourinary: Negative for dysuria, frequency, hematuria and urgency.  Musculoskeletal: Negative for back pain, joint pain, myalgias and neck pain.  Skin: Negative for rash.  Neurological: Negative for dizziness, tingling, sensory change, focal weakness, weakness and headaches.  Endo/Heme/Allergies: Negative for environmental allergies, polydipsia and polyphagia. Does not bruise/bleed easily.  Psychiatric/Behavioral: Negative for depression and suicidal ideas. The patient is not nervous/anxious and does not have insomnia.      Objective  Vitals:   11/17/16 1041  BP: 130/80  Pulse: 80  Weight: 198 lb (89.8 kg)  Height: 5\' 5"  (1.651 m)    Physical Exam  Constitutional: She is well-developed, well-nourished, and in no distress. No distress.  HENT:   Head: Normocephalic and atraumatic.  Right Ear: External ear normal. A foreign body is present.  Left Ear: External ear normal. A foreign body is present.  Nose: Nose normal.  Mouth/Throat: Oropharynx is clear and moist.  Eyes: Conjunctivae and EOM are normal. Pupils are equal, round, and reactive to light. Right eye exhibits no discharge. Left eye exhibits no discharge.  Neck: Normal range of motion. Neck supple. No JVD present. No thyromegaly present.  Cardiovascular: Normal rate, regular rhythm, normal heart sounds and intact distal pulses.  Exam reveals no gallop and no friction rub.   No murmur heard. Pulmonary/Chest: Effort normal and breath sounds normal.  Abdominal: Soft. Bowel sounds are normal. She exhibits no mass. There is no tenderness. There is no guarding.  Musculoskeletal: Normal range of motion. She exhibits no edema.  Lymphadenopathy:    She has no cervical adenopathy.  Neurological: She is alert. She has normal reflexes.  Skin: Skin is warm and dry. She is not diaphoretic.  Psychiatric: Mood and affect normal.  Nursing note and vitals reviewed.     Assessment & Plan  Problem List Items Addressed This Visit      Cardiovascular and Mediastinum   Essential hypertension   Relevant Medications   losartan (COZAAR) 50 MG tablet   Other Relevant Orders   Renal Function Panel     Digestive   GERD (gastroesophageal reflux disease)   Relevant Medications  omeprazole (PRILOSEC) 20 MG capsule     Endocrine   Diabetes (HCC)   Relevant Medications   losartan (COZAAR) 50 MG tablet   metFORMIN (GLUCOPHAGE) 500 MG tablet   Other Relevant Orders   Microalbumin / creatinine urine ratio   Diabetes mellitus with no complication (HCC) - Primary   Relevant Medications   losartan (COZAAR) 50 MG tablet   metFORMIN (GLUCOPHAGE) 500 MG tablet   Other Relevant Orders   Hemoglobin A1c   Lipid Profile     Other   Familial multiple lipoprotein-type hyperlipidemia    Relevant Medications   losartan (COZAAR) 50 MG tablet   Other Relevant Orders   Lipid Profile    Other Visit Diagnoses    Bilateral impacted cerumen       Relevant Medications   carbamide peroxide (DEBROX) 6.5 % otic solution 5 drop        Dr. Hayden Rasmusseneanna Jones [facility]  11/17/16

## 2016-11-18 LAB — HEMOGLOBIN A1C
Est. average glucose Bld gHb Est-mCnc: 140 mg/dL
Hgb A1c MFr Bld: 6.5 % — ABNORMAL HIGH (ref 4.8–5.6)

## 2016-11-18 LAB — RENAL FUNCTION PANEL
Albumin: 4.1 g/dL (ref 3.5–4.7)
BUN / CREAT RATIO: 29 — AB (ref 12–28)
BUN: 21 mg/dL (ref 8–27)
CALCIUM: 9.5 mg/dL (ref 8.7–10.3)
CHLORIDE: 105 mmol/L (ref 96–106)
CO2: 20 mmol/L (ref 18–29)
Creatinine, Ser: 0.73 mg/dL (ref 0.57–1.00)
GFR calc non Af Amer: 77 mL/min/{1.73_m2} (ref >59)
GFR, EST AFRICAN AMERICAN: 89 mL/min/{1.73_m2} (ref >59)
Glucose: 92 mg/dL (ref 65–99)
Phosphorus: 3.8 mg/dL (ref 2.5–4.5)
Potassium: 4.6 mmol/L (ref 3.5–5.2)
SODIUM: 142 mmol/L (ref 134–144)

## 2016-11-18 LAB — LIPID PANEL
CHOL/HDL RATIO: 2.6 ratio (ref 0.0–4.4)
Cholesterol, Total: 188 mg/dL (ref 100–199)
HDL: 73 mg/dL (ref >39)
LDL CALC: 103 mg/dL — AB (ref 0–99)
Triglycerides: 62 mg/dL (ref 0–149)
VLDL CHOLESTEROL CAL: 12 mg/dL (ref 5–40)

## 2016-11-18 LAB — MICROALBUMIN / CREATININE URINE RATIO
CREATININE, UR: 77.8 mg/dL
Microalb/Creat Ratio: 47.4 mg/g creat — ABNORMAL HIGH (ref 0.0–30.0)
Microalbumin, Urine: 36.9 ug/mL

## 2016-12-05 DIAGNOSIS — E113393 Type 2 diabetes mellitus with moderate nonproliferative diabetic retinopathy without macular edema, bilateral: Secondary | ICD-10-CM | POA: Diagnosis not present

## 2016-12-30 ENCOUNTER — Other Ambulatory Visit: Payer: Self-pay

## 2017-02-03 ENCOUNTER — Encounter: Payer: Self-pay | Admitting: Family Medicine

## 2017-02-03 ENCOUNTER — Ambulatory Visit (INDEPENDENT_AMBULATORY_CARE_PROVIDER_SITE_OTHER): Payer: Medicare Other | Admitting: Family Medicine

## 2017-02-03 ENCOUNTER — Ambulatory Visit: Payer: Medicare Other | Admitting: Family Medicine

## 2017-02-03 VITALS — BP 130/80 | HR 70 | Ht 65.0 in | Wt 198.0 lb

## 2017-02-03 DIAGNOSIS — E781 Pure hyperglyceridemia: Secondary | ICD-10-CM | POA: Diagnosis not present

## 2017-02-03 DIAGNOSIS — J301 Allergic rhinitis due to pollen: Secondary | ICD-10-CM

## 2017-02-03 DIAGNOSIS — J0110 Acute frontal sinusitis, unspecified: Secondary | ICD-10-CM

## 2017-02-03 MED ORDER — MOMETASONE FUROATE 50 MCG/ACT NA SUSP: 2.0000 | Freq: Every day | NASAL | 12 refills | Status: DC

## 2017-02-03 MED ORDER — AMOXICILLIN 500 MG PO CAPS: 500.0000 mg | ORAL_CAPSULE | Freq: Three times a day (TID) | ORAL | 0 refills | Status: DC

## 2017-02-03 NOTE — Addendum Note (Signed)
Addended by: Duanne LimerickJONES, Inetha Maret C on: 02/03/2017 02:19 PM   Modules accepted: Orders

## 2017-02-03 NOTE — Progress Notes (Addendum)
Name: [name]   MRN: [id number]    DOB: 28-Oct-1932   Date:02/03/2017       Progress Note  Subjective  Chief Complaint  Chief Complaint  Patient presents with  . Sinusitis    cong, slight headache, puffiness under eyes    Sinusitis  This is a new problem. The current episode started in the past 7 days. The problem has been gradually worsening since onset. There has been no fever. She is experiencing no pain. Associated symptoms include congestion, coughing, headaches, sinus pressure and sneezing. Pertinent negatives include no chills, diaphoresis, ear pain, hoarse voice, neck pain, shortness of breath, sore throat or swollen glands. (Frontal sinus discomfort)    No problem-specific Assessment & Plan notes found for this encounter.   Past Medical History:  Diagnosis Date  . GERD (gastroesophageal reflux disease)   . Hypertension   . Right knee DJD   . Type II diabetes mellitus (HCC)     Past Surgical History:  Procedure Laterality Date  . BACK SURGERY    . CATARACT EXTRACTION Bilateral 03/2016  . CERVICAL DISC SURGERY  12/28/2010   "bulging disc; went in on the left side of my neck" (08/19/2013)  . JOINT REPLACEMENT    . TOTAL KNEE ARTHROPLASTY Left 03/28/2011   Dr Thurston Hole  . TOTAL KNEE ARTHROPLASTY Right 08/19/2013  . TOTAL KNEE ARTHROPLASTY Right 08/19/2013   Procedure: TOTAL KNEE ARTHROPLASTY;  Surgeon: Nilda Simmer, MD;  Location: [facility] OR;  Service: Orthopedics;  Laterality: Right;    Family History  Problem Relation Age of Onset  . Diabetes Mother     Social History   Social History  . Marital status: Widowed    Spouse name: N/A  . Number of children: N/A  . Years of education: N/A   Occupational History  . Not on file.   Social History Main Topics  . Smoking status: Never Smoker  . Smokeless tobacco: Never Used  . Alcohol use No  . Drug use: No  . Sexual activity: No   Other Topics Concern  . Not on file   Social History Narrative  . No  narrative on file    Allergies  Allergen Reactions  . Nsaids     Outpatient Medications Prior to Visit  Medication Sig Dispense Refill  . acetaminophen (TYLENOL) 325 MG tablet Take 2 tablets (650 mg total) by mouth every 6 (six) hours as needed for pain or fever.    Marland Kitchen aspirin EC 325 MG EC tablet Take 1 tablet (325 mg total) by mouth daily with breakfast. 30 tablet 0  . cyanocobalamin 100 MCG tablet Take 1 tablet by mouth daily. otc    . losartan (COZAAR) 50 MG tablet Take 1 tablet (50 mg total) by mouth daily. 30 tablet 5  . metFORMIN (GLUCOPHAGE) 500 MG tablet Take 1 tablet (500 mg total) by mouth 2 (two) times daily with a meal. 60 tablet 5  . Multiple Vitamin (MULTIVITAMIN WITH MINERALS) TABS tablet Take 1 tablet by mouth daily.    Marland Kitchen omeprazole (PRILOSEC) 20 MG capsule Take 1 capsule (20 mg total) by mouth daily. 30 capsule 5  . ONE TOUCH ULTRA TEST test strip USE ONE STRIP TO CHECK GLUCOSE ONCE DAILY 50 each 1   Facility-Administered Medications Prior to Visit  Medication Dose Route Frequency Provider Last Rate Last Dose  . carbamide peroxide (DEBROX) 6.5 % otic solution 5 drop  5 drop Both Ears Once Duanne Limerick, MD  Review of Systems  Constitutional: Negative for chills, diaphoresis, fever, malaise/fatigue and weight loss.  HENT: Positive for congestion, sinus pressure and sneezing. Negative for ear discharge, ear pain, hoarse voice and sore throat.   Eyes: Negative for blurred vision.  Respiratory: Positive for cough. Negative for sputum production, shortness of breath and wheezing.   Cardiovascular: Negative for chest pain, palpitations and leg swelling.  Gastrointestinal: Negative for abdominal pain, blood in stool, constipation, diarrhea, heartburn, melena and nausea.  Genitourinary: Negative for dysuria, frequency, hematuria and urgency.  Musculoskeletal: Negative for back pain, joint pain, myalgias and neck pain.  Skin: Negative for rash.  Neurological: Positive  for headaches. Negative for dizziness, tingling, sensory change and focal weakness.  Endo/Heme/Allergies: Negative for environmental allergies and polydipsia. Does not bruise/bleed easily.  Psychiatric/Behavioral: Negative for depression and suicidal ideas. The patient is not nervous/anxious and does not have insomnia.      Objective  Vitals:   02/03/17 1401  BP: 130/80  Pulse: 70  Weight: 198 lb (89.8 kg)  Height: 5\' 5"  (1.651 m)    Physical Exam  Constitutional: She is well-developed, well-nourished, and in no distress. No distress.  HENT:  Head: Normocephalic and atraumatic.  Right Ear: External ear normal.  Left Ear: External ear normal.  Nose: Right sinus exhibits no maxillary sinus tenderness and no frontal sinus tenderness. Left sinus exhibits no maxillary sinus tenderness and no frontal sinus tenderness.  Mouth/Throat: Uvula is midline, oropharynx is clear and moist and mucous membranes are normal. No oropharyngeal exudate, posterior oropharyngeal edema or posterior oropharyngeal erythema.  Eyes: Conjunctivae and EOM are normal. Pupils are equal, round, and reactive to light. Right eye exhibits no discharge. Left eye exhibits no discharge.  Neck: Normal range of motion. Neck supple. No JVD present. No thyromegaly present.  Cardiovascular: Normal rate, regular rhythm, normal heart sounds and intact distal pulses.  Exam reveals no gallop and no friction rub.   No murmur heard. Pulmonary/Chest: Effort normal and breath sounds normal. She has no wheezes. She has no rales.  Abdominal: Soft. Bowel sounds are normal. She exhibits no mass. There is no tenderness. There is no guarding.  Musculoskeletal: Normal range of motion. She exhibits no edema.  Lymphadenopathy:    She has no cervical adenopathy.  Neurological: She is alert. She has normal reflexes.  Skin: Skin is warm and dry. She is not diaphoretic.  Psychiatric: Mood and affect normal.  Nursing note and vitals  reviewed.     Assessment & Plan  Problem List Items Addressed This Visit      Respiratory   Sinus infection - Primary   Relevant Medications   amoxicillin (AMOXIL) 500 MG capsule   mometasone (NASONEX) 50 MCG/ACT nasal spray     Other   Hyperlipidemia    Other Visit Diagnoses    Acute seasonal allergic rhinitis due to pollen       Relevant Medications   mometasone (NASONEX) 50 MCG/ACT nasal spray      Meds ordered this encounter  Medications  . amoxicillin (AMOXIL) 500 MG capsule    Sig: Take 1 capsule (500 mg total) by mouth 3 (three) times daily.    Dispense:  30 capsule    Refill:  0  . mometasone (NASONEX) 50 MCG/ACT nasal spray    Sig: Place 2 sprays into the nose daily.    Dispense:  17 g    Refill:  12      Dr. Hayden [facility]  02/03/17

## 2017-02-03 NOTE — Patient Instructions (Signed)

## 2017-04-19 ENCOUNTER — Other Ambulatory Visit: Payer: Self-pay

## 2017-04-19 DIAGNOSIS — E119 Type 2 diabetes mellitus without complications: Secondary | ICD-10-CM

## 2017-04-19 MED ORDER — GLUCOSE BLOOD VI STRP: ORAL_STRIP | 1 refills | Status: DC

## 2017-05-22 ENCOUNTER — Other Ambulatory Visit: Payer: Self-pay

## 2017-05-22 DIAGNOSIS — E119 Type 2 diabetes mellitus without complications: Secondary | ICD-10-CM

## 2017-05-22 DIAGNOSIS — I10 Essential (primary) hypertension: Secondary | ICD-10-CM

## 2017-05-22 MED ORDER — METFORMIN HCL 500 MG PO TABS: 500.0000 mg | ORAL_TABLET | Freq: Two times a day (BID) | ORAL | 0 refills | Status: DC

## 2017-05-22 MED ORDER — LOSARTAN POTASSIUM 50 MG PO TABS: 50.0000 mg | ORAL_TABLET | Freq: Every day | ORAL | 0 refills | Status: DC

## 2017-06-01 DIAGNOSIS — E113393 Type 2 diabetes mellitus with moderate nonproliferative diabetic retinopathy without macular edema, bilateral: Secondary | ICD-10-CM | POA: Diagnosis not present

## 2017-06-23 ENCOUNTER — Encounter: Payer: Self-pay | Admitting: Family Medicine

## 2017-06-23 ENCOUNTER — Ambulatory Visit (INDEPENDENT_AMBULATORY_CARE_PROVIDER_SITE_OTHER): Payer: Medicare Other | Admitting: Family Medicine

## 2017-06-23 VITALS — BP 124/72 | HR 78 | Resp 12 | Ht 65.0 in | Wt 200.4 lb

## 2017-06-23 DIAGNOSIS — K219 Gastro-esophageal reflux disease without esophagitis: Secondary | ICD-10-CM

## 2017-06-23 DIAGNOSIS — E119 Type 2 diabetes mellitus without complications: Secondary | ICD-10-CM

## 2017-06-23 DIAGNOSIS — Z13820 Encounter for screening for osteoporosis: Secondary | ICD-10-CM

## 2017-06-23 DIAGNOSIS — I10 Essential (primary) hypertension: Secondary | ICD-10-CM | POA: Diagnosis not present

## 2017-06-23 DIAGNOSIS — E784 Other hyperlipidemia: Secondary | ICD-10-CM

## 2017-06-23 DIAGNOSIS — E7849 Other hyperlipidemia: Secondary | ICD-10-CM

## 2017-06-23 MED ORDER — METFORMIN HCL 500 MG PO TABS: 500.0000 mg | ORAL_TABLET | Freq: Two times a day (BID) | ORAL | 5 refills | Status: DC

## 2017-06-23 MED ORDER — LOSARTAN POTASSIUM 50 MG PO TABS: 50.0000 mg | ORAL_TABLET | Freq: Every day | ORAL | 5 refills | Status: DC

## 2017-06-23 MED ORDER — OMEPRAZOLE 20 MG PO CPDR: 20.0000 mg | DELAYED_RELEASE_CAPSULE | Freq: Every day | ORAL | 5 refills | Status: DC

## 2017-06-23 NOTE — Progress Notes (Signed)
Name: [name]   MRN: [id number]    DOB: 28-Apr-1932   Date:06/23/2017       Progress Note  Subjective  Chief Complaint  Chief Complaint  Patient presents with  . Diabetes    Needs refills on test strips and metformin,.   . Gastroesophageal Reflux    Needs refill on prilosec  . Hypertension    Needs refill on losartan  . Edema    Noticed swelling in feet yesterday. Wanted Dr Yetta Barre to take a look at them.     Diabetes  She presents for her follow-up diabetic visit. She has type 2 diabetes mellitus. Her disease course has been stable. Pertinent negatives for hypoglycemia include no confusion, dizziness, headaches, hunger, mood changes, nervousness/anxiousness, pallor, seizures, sleepiness, speech difficulty, sweats or tremors. Pertinent negatives for diabetes include no blurred vision, no chest pain, no fatigue, no foot paresthesias, no foot ulcerations, no polydipsia, no polyphagia, no polyuria, no visual change, no weakness and no weight loss. There are no hypoglycemic complications. Symptoms are stable. There are no diabetic complications. Risk factors for coronary artery disease include dyslipidemia, diabetes mellitus and hypertension. Current diabetic treatment includes oral agent (monotherapy). She is compliant with treatment all of the time. Her weight is stable. She is following a generally healthy diet. Her breakfast blood glucose is taken between 8-9 am. Her breakfast blood glucose range is generally 110-130 mg/dl. An ACE inhibitor/angiotensin II receptor blocker is being taken. She does not see a podiatrist.Eye exam is current.  Gastroesophageal Reflux  She reports no abdominal pain, no belching, no chest pain, no choking, no coughing, no dysphagia, no early satiety, no globus sensation, no heartburn, no hoarse voice, no nausea, no sore throat, no stridor, no tooth decay, no water brash or no wheezing. This is a chronic problem. The problem has been unchanged. The symptoms are  aggravated by certain foods. Pertinent negatives include no anemia, fatigue, melena, muscle weakness, orthopnea or weight loss. There are no known risk factors. She has tried a PPI for the symptoms. The treatment provided no relief. Past procedures do not include an abdominal ultrasound, an EGD, esophageal pH monitoring, H. pylori antibody titer or a UGI.  Hypertension  This is a chronic problem. The current episode started more than 1 year ago. The problem is unchanged. The problem is controlled. Pertinent negatives include no anxiety, blurred vision, chest pain, headaches, malaise/fatigue, neck pain, orthopnea, palpitations, peripheral edema, PND, shortness of breath or sweats. There are no associated agents to hypertension. There are no known risk factors for coronary artery disease. Past treatments include angiotensin blockers. The current treatment provides mild improvement. There are no compliance problems.  There is no history of chronic renal disease.  Hyperlipidemia  This is a chronic problem. The problem is controlled. Recent lipid tests were reviewed and are normal. She has no history of chronic renal disease, diabetes, hypothyroidism, liver disease, obesity or nephrotic syndrome. Pertinent negatives include no chest pain, focal weakness, myalgias or shortness of breath. The current treatment provides mild improvement of lipids.    No problem-specific Assessment & Plan notes found for this encounter.   Past Medical History:  Diagnosis Date  . GERD (gastroesophageal reflux disease)   . Hypertension   . Right knee DJD   . Type II diabetes mellitus (HCC)     Past Surgical History:  Procedure Laterality Date  . BACK SURGERY    . CATARACT EXTRACTION Bilateral 03/2016  . CERVICAL DISC SURGERY  12/28/2010   "  bulging disc; went in on the left side of my neck" (08/19/2013)  . JOINT REPLACEMENT    . TOTAL KNEE ARTHROPLASTY Left 03/28/2011   Dr Thurston HoleWainer  . TOTAL KNEE ARTHROPLASTY Right 08/19/2013   . TOTAL KNEE ARTHROPLASTY Right 08/19/2013   Procedure: TOTAL KNEE ARTHROPLASTY;  Surgeon: Nilda Simmerobert A Wainer, MD;  Location: [facility] OR;  Service: Orthopedics;  Laterality: Right;    Family History  Problem Relation Age of Onset  . Diabetes Mother     Social History   Social History  . Marital status: Widowed    Spouse name: N/A  . Number of children: N/A  . Years of education: N/A   Occupational History  . Not on file.   Social History Main Topics  . Smoking status: Never Smoker  . Smokeless tobacco: Never Used  . Alcohol use No  . Drug use: No  . Sexual activity: No   Other Topics Concern  . Not on file   Social History Narrative  . No narrative on file    Allergies  Allergen Reactions  . Nsaids     Outpatient Medications Prior to Visit  Medication Sig Dispense Refill  . acetaminophen (TYLENOL) 325 MG tablet Take 2 tablets (650 mg total) by mouth every 6 (six) hours as needed for pain or fever.    Marland Kitchen. aspirin EC 325 MG EC tablet Take 1 tablet (325 mg total) by mouth daily with breakfast. 30 tablet 0  . glucose blood (ONE TOUCH ULTRA TEST) test strip USE ONE STRIP TO CHECK GLUCOSE ONCE DAILY 50 each 1  . mometasone (NASONEX) 50 MCG/ACT nasal spray Place 2 sprays into the nose daily. 17 g 12  . losartan (COZAAR) 50 MG tablet Take 1 tablet (50 mg total) by mouth daily. 30 tablet 0  . metFORMIN (GLUCOPHAGE) 500 MG tablet Take 1 tablet (500 mg total) by mouth 2 (two) times daily with a meal. 60 tablet 0  . omeprazole (PRILOSEC) 20 MG capsule Take 1 capsule (20 mg total) by mouth daily. 30 capsule 5  . cyanocobalamin 100 MCG tablet Take 1 tablet by mouth daily. otc    . Multiple Vitamin (MULTIVITAMIN WITH MINERALS) TABS tablet Take 1 tablet by mouth daily.    Marland Kitchen. amoxicillin (AMOXIL) 500 MG capsule Take 1 capsule (500 mg total) by mouth 3 (three) times daily. (Patient not taking: Reported on 06/23/2017) 30 capsule 0   Facility-Administered Medications Prior to Visit  Medication  Dose Route Frequency Provider Last Rate Last Dose  . carbamide peroxide (DEBROX) 6.5 % otic solution 5 drop  5 drop Both EARS Once Duanne LimerickJones, Pleshette Tomasini C, MD        Review of Systems  Constitutional: Negative for chills, fatigue, fever, malaise/fatigue and weight loss.  HENT: Negative for ear discharge, ear pain, hoarse voice and sore throat.   Eyes: Negative for blurred vision.  Respiratory: Negative for cough, sputum production, choking, shortness of breath and wheezing.   Cardiovascular: Negative for chest pain, palpitations, orthopnea, leg swelling and PND.  Gastrointestinal: Negative for abdominal pain, blood in stool, constipation, diarrhea, dysphagia, heartburn, melena and nausea.  Genitourinary: Negative for dysuria, frequency, hematuria and urgency.  Musculoskeletal: Negative for back pain, joint pain, myalgias, muscle weakness and neck pain.  Skin: Negative for pallor and rash.  Neurological: Negative for dizziness, tingling, tremors, sensory change, focal weakness, seizures, speech difficulty, weakness and headaches.  Endo/Heme/Allergies: Negative for environmental allergies, polydipsia and polyphagia. Does not bruise/bleed easily.  Psychiatric/Behavioral: Negative for confusion, depression  and suicidal ideas. The patient is not nervous/anxious and does not have insomnia.      Objective  Vitals:   06/23/17 1025 06/23/17 1040  BP: (!) 148/84 124/72  Pulse: (!) 126 78  Resp:  12  SpO2: 98% 98%  Weight: 200 lb 6.4 oz (90.9 kg)   Height: 5\' 5"  (1.651 m)     Physical Exam  Constitutional: She is well-developed, well-nourished, and in no distress. No distress.  HENT:  Head: Normocephalic and atraumatic.  Right Ear: External ear normal.  Left Ear: External ear normal.  Nose: Nose normal.  Mouth/Throat: Oropharynx is clear and moist.  Eyes: Pupils are equal, round, and reactive to light. Conjunctivae and EOM are normal. Right eye exhibits no discharge. Left eye exhibits no  discharge.  Neck: Normal range of motion. Neck supple. No JVD present. No thyromegaly present.  Cardiovascular: Normal rate, regular rhythm, normal heart sounds and intact distal pulses.  Exam reveals no gallop and no friction rub.   No murmur heard. Pulmonary/Chest: Effort normal and breath sounds normal. She has no wheezes. She has no rales.  Abdominal: Soft. Bowel sounds are normal. She exhibits no mass. There is no tenderness. There is no guarding.  Musculoskeletal: Normal range of motion. She exhibits no edema.  Lymphadenopathy:    She has no cervical adenopathy.  Neurological: She is alert. She has normal reflexes.  Skin: Skin is warm and dry. She is not diaphoretic.  Psychiatric: Mood and affect normal.  Nursing note and vitals reviewed.     Assessment & Plan  Problem List Items Addressed This Visit      Cardiovascular and Mediastinum   Essential hypertension - Primary   Relevant Medications   losartan (COZAAR) 50 MG tablet   Other Relevant Orders   Renal Function Panel     Digestive   GERD (gastroesophageal reflux disease)   Relevant Medications   omeprazole (PRILOSEC) 20 MG capsule     Endocrine   Diabetes (HCC)   Relevant Medications   losartan (COZAAR) 50 MG tablet   metFORMIN (GLUCOPHAGE) 500 MG tablet   Diabetes mellitus with no complication (HCC)   Relevant Medications   losartan (COZAAR) 50 MG tablet   metFORMIN (GLUCOPHAGE) 500 MG tablet   Other Relevant Orders   Hemoglobin A1c   Renal Function Panel   Lipid Profile   Microalbumin / creatinine urine ratio     Other   Familial multiple lipoprotein-type hyperlipidemia   Relevant Medications   losartan (COZAAR) 50 MG tablet   Other Relevant Orders   Lipid Profile    Other Visit Diagnoses    Osteoporosis screening       Relevant Orders   DG Bone Density      Meds ordered this encounter  Medications  . losartan (COZAAR) 50 MG tablet    Sig: Take 1 tablet (50 mg total) by mouth daily.     Dispense:  30 tablet    Refill:  5    Last time filling  . omeprazole (PRILOSEC) 20 MG capsule    Sig: Take 1 capsule (20 mg total) by mouth daily.    Dispense:  30 capsule    Refill:  5    sched appt for med refill  . metFORMIN (GLUCOPHAGE) 500 MG tablet    Sig: Take 1 tablet (500 mg total) by mouth 2 (two) times daily with a meal.    Dispense:  60 tablet    Refill:  5    Last  time filling      Dr. Hayden [facility]  06/23/17

## 2017-06-23 NOTE — Patient Instructions (Signed)

## 2017-06-24 LAB — RENAL FUNCTION PANEL
Albumin: 3.8 g/dL (ref 3.5–4.7)
BUN/Creatinine Ratio: 16 (ref 12–28)
BUN: 13 mg/dL (ref 8–27)
CHLORIDE: 106 mmol/L (ref 96–106)
CO2: 22 mmol/L (ref 20–29)
Calcium: 9.3 mg/dL (ref 8.7–10.3)
Creatinine, Ser: 0.81 mg/dL (ref 0.57–1.00)
GFR, EST AFRICAN AMERICAN: 78 mL/min/{1.73_m2} (ref >59)
GFR, EST NON AFRICAN AMERICAN: 67 mL/min/{1.73_m2} (ref >59)
GLUCOSE: 175 mg/dL — AB (ref 65–99)
POTASSIUM: 4.6 mmol/L (ref 3.5–5.2)
Phosphorus: 3.8 mg/dL (ref 2.5–4.5)
SODIUM: 143 mmol/L (ref 134–144)

## 2017-06-24 LAB — LIPID PANEL
CHOL/HDL RATIO: 2.5 ratio (ref 0.0–4.4)
Cholesterol, Total: 176 mg/dL (ref 100–199)
HDL: 70 mg/dL (ref >39)
LDL Calculated: 94 mg/dL (ref 0–99)
Triglycerides: 61 mg/dL (ref 0–149)
VLDL Cholesterol Cal: 12 mg/dL (ref 5–40)

## 2017-06-24 LAB — MICROALBUMIN / CREATININE URINE RATIO
CREATININE, UR: 60.2 mg/dL
MICROALBUM., U, RANDOM: 26.3 ug/mL
Microalb/Creat Ratio: 43.7 mg/g creat — ABNORMAL HIGH (ref 0.0–30.0)

## 2017-06-24 LAB — HEMOGLOBIN A1C
ESTIMATED AVERAGE GLUCOSE: 140 mg/dL
Hgb A1c MFr Bld: 6.5 % — ABNORMAL HIGH (ref 4.8–5.6)

## 2017-06-26 ENCOUNTER — Ambulatory Visit: Payer: Medicare Other | Admitting: Family Medicine

## 2017-06-27 ENCOUNTER — Ambulatory Visit: Payer: Medicare Other | Admitting: Family Medicine

## 2017-07-25 ENCOUNTER — Other Ambulatory Visit: Payer: Self-pay

## 2017-08-28 LAB — HM DIABETES EYE EXAM

## 2017-08-29 ENCOUNTER — Other Ambulatory Visit: Payer: Self-pay

## 2018-01-12 ENCOUNTER — Ambulatory Visit (INDEPENDENT_AMBULATORY_CARE_PROVIDER_SITE_OTHER): Payer: Medicare Other | Admitting: Family Medicine

## 2018-01-12 ENCOUNTER — Encounter: Payer: Self-pay | Admitting: Family Medicine

## 2018-01-12 VITALS — BP 104/62 | HR 72 | Ht 65.0 in | Wt 211.0 lb

## 2018-01-12 DIAGNOSIS — I10 Essential (primary) hypertension: Secondary | ICD-10-CM

## 2018-01-12 DIAGNOSIS — E119 Type 2 diabetes mellitus without complications: Secondary | ICD-10-CM | POA: Diagnosis not present

## 2018-01-12 DIAGNOSIS — E7800 Pure hypercholesterolemia, unspecified: Secondary | ICD-10-CM

## 2018-01-12 DIAGNOSIS — J301 Allergic rhinitis due to pollen: Secondary | ICD-10-CM

## 2018-01-12 DIAGNOSIS — K219 Gastro-esophageal reflux disease without esophagitis: Secondary | ICD-10-CM | POA: Diagnosis not present

## 2018-01-12 MED ORDER — RANITIDINE HCL 300 MG PO TABS: 300.0000 mg | ORAL_TABLET | Freq: Every day | ORAL | 5 refills | Status: DC

## 2018-01-12 MED ORDER — ACETAMINOPHEN 325 MG PO TABS: 650.0000 mg | ORAL_TABLET | Freq: Four times a day (QID) | ORAL | Status: DC | PRN

## 2018-01-12 MED ORDER — GLUCOSE BLOOD VI STRP: ORAL_STRIP | 1 refills | Status: DC

## 2018-01-12 MED ORDER — MOMETASONE FUROATE 50 MCG/ACT NA SUSP: 2.0000 | Freq: Every day | NASAL | 12 refills | Status: DC

## 2018-01-12 MED ORDER — LOSARTAN POTASSIUM 50 MG PO TABS: 50.0000 mg | ORAL_TABLET | Freq: Every day | ORAL | 5 refills | Status: DC

## 2018-01-12 MED ORDER — METFORMIN HCL 500 MG PO TABS: 500.0000 mg | ORAL_TABLET | Freq: Two times a day (BID) | ORAL | 5 refills | Status: DC

## 2018-01-12 NOTE — Progress Notes (Signed)
Name: [name]   MRN: [id number]    DOB: [birth year]   Date:01/12/2018       Progress Note  Subjective  Chief Complaint  Chief Complaint  Patient presents with  . Gastroesophageal Reflux  . Hypertension  . Diabetes    Gastroesophageal Reflux  She reports no abdominal pain, no belching, no chest pain, no choking, no coughing, no dysphagia, no early satiety, no globus sensation, no heartburn, no hoarse voice, no nausea, no sore throat or no wheezing. This is a chronic problem. The current episode started more than 1 year ago. The problem has been waxing and waning. The symptoms are aggravated by certain foods. Pertinent negatives include no anemia, fatigue, melena, muscle weakness, orthopnea or weight loss. She has tried a PPI for the symptoms. The treatment provided moderate relief.  Hypertension  This is a chronic problem. The current episode started more than 1 year ago. The problem has been gradually improving since onset. The problem is controlled. Pertinent negatives include no anxiety, blurred vision, chest pain, headaches, malaise/fatigue, neck pain, orthopnea, palpitations, peripheral edema, PND, shortness of breath or sweats. Risk factors for coronary artery disease include dyslipidemia and obesity. Past treatments include angiotensin blockers. There are no compliance problems.  There is no history of angina, kidney disease, CAD/MI, CVA, heart failure, left ventricular hypertrophy, PVD or retinopathy. There is no history of chronic renal disease, a hypertension causing med or renovascular disease.  Diabetes  She presents for her follow-up diabetic visit. She has type 2 diabetes mellitus. Her disease course has been stable. There are no hypoglycemic associated symptoms. Pertinent negatives for hypoglycemia include no dizziness, headaches, nervousness/anxiousness or sweats. Pertinent negatives for diabetes include no blurred vision, no chest pain, no fatigue, no foot paresthesias, no  foot ulcerations, no polydipsia, no polyphagia, no polyuria, no visual change, no weakness and no weight loss. There are no hypoglycemic complications. Symptoms are stable. There are no diabetic complications. Pertinent negatives for diabetic complications include no CVA, PVD or retinopathy. Current diabetic treatment includes oral agent (monotherapy). She is compliant with treatment all of the time. Her weight is stable. She is following a generally healthy diet. When asked about meal planning, she reported none. She participates in exercise intermittently. Her breakfast blood glucose is taken between 8-9 am. Her breakfast blood glucose range is generally 90-110 mg/dl. An ACE inhibitor/angiotensin II receptor blocker is being taken. She does not see a podiatrist.Eye exam is not current.  Hyperlipidemia  This is a chronic problem. The current episode started more than 1 year ago. The problem is controlled. She has no history of chronic renal disease, diabetes, obesity or nephrotic syndrome. Pertinent negatives include no chest pain, focal sensory loss, focal weakness, leg pain, myalgias or shortness of breath. Current antihyperlipidemic treatment includes diet change. The current treatment provides moderate improvement of lipids.    No problem-specific Assessment & Plan notes found for this encounter.   Past Medical History:  Diagnosis Date  . GERD (gastroesophageal reflux disease)   . Hypertension   . Right knee DJD   . Type II diabetes mellitus (HCC)     Past Surgical History:  Procedure Laterality Date  . BACK SURGERY    . CATARACT EXTRACTION Bilateral 03/2016  . CERVICAL DISC SURGERY  12/28/2010   "bulging disc; went in on the left side of my neck" (08/19/2013)  . JOINT REPLACEMENT    . TOTAL KNEE ARTHROPLASTY Left 03/28/2011   Dr Thurston Hole  . TOTAL KNEE ARTHROPLASTY Right  08/19/2013  . TOTAL KNEE ARTHROPLASTY Right 08/19/2013   Procedure: TOTAL KNEE ARTHROPLASTY;  Surgeon: Nilda Simmerobert A Wainer, MD;   Location: [facility] OR;  Service: Orthopedics;  Laterality: Right;    Family History  Problem Relation Age of Onset  . Diabetes Mother     Social History   Socioeconomic History  . Marital status: Widowed    Spouse name: Not on file  . Number of children: Not on file  . Years of education: Not on file  . Highest education level: Not on file  Social Needs  . Financial resource strain: Not on file  . Food insecurity - worry: Not on file  . Food insecurity - inability: Not on file  . Transportation needs - medical: Not on file  . Transportation needs - non-medical: Not on file  Occupational History  . Not on file  Tobacco Use  . Smoking status: Never Smoker  . Smokeless tobacco: Never Used  Substance and Sexual Activity  . Alcohol use: No  . Drug use: No  . Sexual activity: No  Other Topics Concern  . Not on file  Social History Narrative  . Not on file    Allergies  Allergen Reactions  . Nsaids     Outpatient Medications Prior to Visit  Medication Sig Dispense Refill  . aspirin 81 MG tablet Take 81 mg by mouth daily.    Marland Kitchen. aspirin EC 325 MG EC tablet Take 1 tablet (325 mg total) by mouth daily with breakfast. 30 tablet 0  . glucose blood (ONE TOUCH ULTRA TEST) test strip USE ONE STRIP TO CHECK GLUCOSE ONCE DAILY 50 each 1  . losartan (COZAAR) 50 MG tablet Take 1 tablet (50 mg total) by mouth daily. 30 tablet 5  . metFORMIN (GLUCOPHAGE) 500 MG tablet Take 1 tablet (500 mg total) by mouth 2 (two) times daily with a meal. 60 tablet 5  . mometasone (NASONEX) 50 MCG/ACT nasal spray Place 2 sprays into the nose daily. 17 g 12  . Multiple Vitamin (MULTIVITAMIN WITH MINERALS) TABS tablet Take 1 tablet by mouth daily.    Marland Kitchen. omeprazole (PRILOSEC) 20 MG capsule Take 1 capsule (20 mg total) by mouth daily. 30 capsule 5  . cyanocobalamin 100 MCG tablet Take 1 tablet by mouth daily. otc    . acetaminophen (TYLENOL) 325 MG tablet Take 2 tablets (650 mg total) by mouth every 6 (six) hours  as needed for pain or fever. (Patient not taking: Reported on 01/12/2018)     Facility-Administered Medications Prior to Visit  Medication Dose Route Frequency Provider Last Rate Last Dose  . carbamide peroxide (DEBROX) 6.5 % otic solution 5 drop  5 drop Both EARS Once Duanne LimerickJones, Deanna C, MD        Review of Systems  Constitutional: Negative for chills, fatigue, fever, malaise/fatigue and weight loss.  HENT: Negative for ear discharge, ear pain, hoarse voice and sore throat.   Eyes: Negative for blurred vision.  Respiratory: Negative for cough, sputum production, choking, shortness of breath and wheezing.   Cardiovascular: Negative for chest pain, palpitations, orthopnea, leg swelling and PND.  Gastrointestinal: Negative for abdominal pain, blood in stool, constipation, diarrhea, dysphagia, heartburn, melena and nausea.  Genitourinary: Negative for dysuria, frequency, hematuria and urgency.  Musculoskeletal: Negative for back pain, joint pain, myalgias, muscle weakness and neck pain.  Skin: Negative for rash.  Neurological: Negative for dizziness, tingling, sensory change, focal weakness, weakness and headaches.  Endo/Heme/Allergies: Negative for environmental allergies, polydipsia and polyphagia.  Does not bruise/bleed easily.  Psychiatric/Behavioral: Negative for depression and suicidal ideas. The patient is not nervous/anxious and does not have insomnia.      Objective  Vitals:   01/12/18 1020  BP: 104/62  Pulse: 72  Weight: 211 lb (95.7 kg)  Height: 5\' 5"  (1.651 m)    Physical Exam  Constitutional: She is well-developed, well-nourished, and in no distress. No distress.  HENT:  Head: Normocephalic and atraumatic.  Right Ear: External ear normal.  Left Ear: External ear normal.  Nose: Nose normal.  Mouth/Throat: Oropharynx is clear and moist.  Eyes: Conjunctivae and EOM are normal. Pupils are equal, round, and reactive to light. Right eye exhibits no discharge. Left eye exhibits  no discharge.  Neck: Normal range of motion. Neck supple. No JVD present. No thyromegaly present.  Cardiovascular: Normal rate, regular rhythm, normal heart sounds and intact distal pulses. Exam reveals no gallop and no friction rub.  No murmur heard. Pulmonary/Chest: Effort normal and breath sounds normal. She has no wheezes. She has no rales.  Abdominal: Soft. Bowel sounds are normal. She exhibits no mass. There is no tenderness. There is no guarding.  Musculoskeletal: Normal range of motion. She exhibits no edema.  Lymphadenopathy:    She has no cervical adenopathy.  Neurological: She is alert. She has normal reflexes.  Skin: Skin is warm and dry. She is not diaphoretic.  Psychiatric: Mood and affect normal.  Nursing note and vitals reviewed.     Assessment & Plan  Problem List Items Addressed This Visit      Cardiovascular and Mediastinum   Essential hypertension   Relevant Medications   aspirin 81 MG tablet   losartan (COZAAR) 50 MG tablet   Other Relevant Orders   Renal Function Panel     Digestive   GERD (gastroesophageal reflux disease)   Relevant Medications   ranitidine (ZANTAC) 300 MG tablet     Endocrine   Diabetes (HCC) - Primary   Relevant Medications   aspirin 81 MG tablet   metFORMIN (GLUCOPHAGE) 500 MG tablet   losartan (COZAAR) 50 MG tablet   Other Relevant Orders   Hemoglobin A1c   Renal Function Panel   Lipid panel     Other   Hyperlipidemia   Relevant Medications   aspirin 81 MG tablet   losartan (COZAAR) 50 MG tablet   Other Relevant Orders   Lipid panel    Other Visit Diagnoses    Acute seasonal allergic rhinitis due to pollen       Relevant Medications   mometasone (NASONEX) 50 MCG/ACT nasal spray   Seasonal allergic rhinitis due to pollen          Meds ordered this encounter  Medications  . metFORMIN (GLUCOPHAGE) 500 MG tablet    Sig: Take 1 tablet (500 mg total) by mouth 2 (two) times daily with a meal.    Dispense:  60 tablet     Refill:  5    Last time filling  . glucose blood (ONE TOUCH ULTRA TEST) test strip    Sig: USE ONE STRIP TO CHECK GLUCOSE ONCE DAILY    Dispense:  50 each    Refill:  1    E11.9  . mometasone (NASONEX) 50 MCG/ACT nasal spray    Sig: Place 2 sprays into the nose daily.    Dispense:  17 g    Refill:  12  . losartan (COZAAR) 50 MG tablet    Sig: Take 1 tablet (50 mg  total) by mouth daily.    Dispense:  30 tablet    Refill:  5    Last time filling  . ranitidine (ZANTAC) 300 MG tablet    Sig: Take 1 tablet (300 mg total) by mouth at bedtime.    Dispense:  30 tablet    Refill:  5  . acetaminophen (TYLENOL) 325 MG tablet    Sig: Take 2 tablets (650 mg total) by mouth every 6 (six) hours as needed.      Dr. [facility]  01/12/18

## 2018-01-13 LAB — LIPID PANEL
CHOL/HDL RATIO: 2.3 ratio (ref 0.0–4.4)
Cholesterol, Total: 184 mg/dL (ref 100–199)
HDL: 79 mg/dL (ref >39)
LDL CALC: 97 mg/dL (ref 0–99)
TRIGLYCERIDES: 41 mg/dL (ref 0–149)
VLDL CHOLESTEROL CAL: 8 mg/dL (ref 5–40)

## 2018-01-13 LAB — RENAL FUNCTION PANEL
ALBUMIN: 4.2 g/dL (ref 3.5–4.7)
BUN/Creatinine Ratio: 24 (ref 12–28)
BUN: 17 mg/dL (ref 8–27)
CO2: 22 mmol/L (ref 20–29)
CREATININE: 0.72 mg/dL (ref 0.57–1.00)
Calcium: 9.7 mg/dL (ref 8.7–10.3)
Chloride: 103 mmol/L (ref 96–106)
GFR, EST AFRICAN AMERICAN: 90 mL/min/{1.73_m2} (ref >59)
GFR, EST NON AFRICAN AMERICAN: 78 mL/min/{1.73_m2} (ref >59)
GLUCOSE: 99 mg/dL (ref 65–99)
PHOSPHORUS: 3.8 mg/dL (ref 2.5–4.5)
Potassium: 5 mmol/L (ref 3.5–5.2)
Sodium: 141 mmol/L (ref 134–144)

## 2018-01-13 LAB — HEMOGLOBIN A1C
Est. average glucose Bld gHb Est-mCnc: 140 mg/dL
Hgb A1c MFr Bld: 6.5 % — ABNORMAL HIGH (ref 4.8–5.6)

## 2018-01-22 DIAGNOSIS — M1612 Unilateral primary osteoarthritis, left hip: Secondary | ICD-10-CM | POA: Diagnosis not present

## 2018-01-22 DIAGNOSIS — M25562 Pain in left knee: Secondary | ICD-10-CM | POA: Diagnosis not present

## 2018-01-22 DIAGNOSIS — M25561 Pain in right knee: Secondary | ICD-10-CM | POA: Diagnosis not present

## 2018-02-05 ENCOUNTER — Observation Stay
Admission: EM | Admit: 2018-02-05 | Discharge: 2018-02-07 | Disposition: A | Discharge status: Home Health | Payer: Medicare Other | Attending: Internal Medicine | Admitting: Internal Medicine

## 2018-02-05 ENCOUNTER — Ambulatory Visit: Payer: Medicare Other | Admitting: Family Medicine

## 2018-02-05 ENCOUNTER — Emergency Department: Payer: Medicare Other

## 2018-02-05 ENCOUNTER — Other Ambulatory Visit: Payer: Self-pay

## 2018-02-05 ENCOUNTER — Encounter: Payer: Self-pay | Admitting: Emergency Medicine

## 2018-02-05 DIAGNOSIS — Z96653 Presence of artificial knee joint, bilateral: Secondary | ICD-10-CM | POA: Diagnosis not present

## 2018-02-05 DIAGNOSIS — I119 Hypertensive heart disease without heart failure: Secondary | ICD-10-CM | POA: Diagnosis present

## 2018-02-05 DIAGNOSIS — Z79899 Other long term (current) drug therapy: Secondary | ICD-10-CM | POA: Insufficient documentation

## 2018-02-05 DIAGNOSIS — I10 Essential (primary) hypertension: Secondary | ICD-10-CM | POA: Diagnosis not present

## 2018-02-05 DIAGNOSIS — S299XXA Unspecified injury of thorax, initial encounter: Secondary | ICD-10-CM | POA: Diagnosis not present

## 2018-02-05 DIAGNOSIS — E119 Type 2 diabetes mellitus without complications: Secondary | ICD-10-CM | POA: Insufficient documentation

## 2018-02-05 DIAGNOSIS — R001 Bradycardia, unspecified: Secondary | ICD-10-CM | POA: Diagnosis not present

## 2018-02-05 DIAGNOSIS — R55 Syncope and collapse: Secondary | ICD-10-CM

## 2018-02-05 DIAGNOSIS — Z7984 Long term (current) use of oral hypoglycemic drugs: Secondary | ICD-10-CM | POA: Insufficient documentation

## 2018-02-05 DIAGNOSIS — Z7951 Long term (current) use of inhaled steroids: Secondary | ICD-10-CM | POA: Diagnosis not present

## 2018-02-05 DIAGNOSIS — K219 Gastro-esophageal reflux disease without esophagitis: Secondary | ICD-10-CM | POA: Diagnosis not present

## 2018-02-05 DIAGNOSIS — Z7982 Long term (current) use of aspirin: Secondary | ICD-10-CM | POA: Insufficient documentation

## 2018-02-05 DIAGNOSIS — E538 Deficiency of other specified B group vitamins: Secondary | ICD-10-CM | POA: Insufficient documentation

## 2018-02-05 DIAGNOSIS — R42 Dizziness and giddiness: Principal | ICD-10-CM | POA: Insufficient documentation

## 2018-02-05 DIAGNOSIS — R531 Weakness: Secondary | ICD-10-CM | POA: Diagnosis not present

## 2018-02-05 DIAGNOSIS — I351 Nonrheumatic aortic (valve) insufficiency: Secondary | ICD-10-CM | POA: Insufficient documentation

## 2018-02-05 LAB — COMPREHENSIVE METABOLIC PANEL
ALT: 12 U/L — AB (ref 14–54)
AST: 14 U/L — AB (ref 15–41)
Albumin: 3.6 g/dL (ref 3.5–5.0)
Alkaline Phosphatase: 78 U/L (ref 38–126)
Anion gap: 10 (ref 5–15)
BUN: 22 mg/dL — AB (ref 6–20)
CHLORIDE: 101 mmol/L (ref 101–111)
CO2: 21 mmol/L — AB (ref 22–32)
CREATININE: 0.66 mg/dL (ref 0.44–1.00)
Calcium: 9 mg/dL (ref 8.9–10.3)
Glucose, Bld: 159 mg/dL — ABNORMAL HIGH (ref 65–99)
POTASSIUM: 4.9 mmol/L (ref 3.5–5.1)
SODIUM: 132 mmol/L — AB (ref 135–145)
Total Bilirubin: 0.5 mg/dL (ref 0.3–1.2)
Total Protein: 7.7 g/dL (ref 6.5–8.1)

## 2018-02-05 LAB — URINALYSIS, COMPLETE (UACMP) WITH MICROSCOPIC
Bilirubin Urine: NEGATIVE
Glucose, UA: NEGATIVE mg/dL
Hgb urine dipstick: NEGATIVE
KETONES UR: NEGATIVE mg/dL
Leukocytes, UA: NEGATIVE
Nitrite: NEGATIVE
Protein, ur: NEGATIVE mg/dL
Specific Gravity, Urine: 1.015 (ref 1.005–1.030)
pH: 5 (ref 5.0–8.0)

## 2018-02-05 LAB — GLUCOSE, CAPILLARY
Glucose-Capillary: 99 mg/dL (ref 65–99)
Glucose-Capillary: 121 mg/dL — ABNORMAL HIGH (ref 65–99)

## 2018-02-05 LAB — CBC
HEMATOCRIT: 39.2 % (ref 35.0–47.0)
HEMOGLOBIN: 12.8 g/dL (ref 12.0–16.0)
MCH: 28.4 pg (ref 26.0–34.0)
MCHC: 32.7 g/dL (ref 32.0–36.0)
MCV: 86.9 fL (ref 80.0–100.0)
Platelets: 266 10*3/uL (ref 150–440)
RBC: 4.5 MIL/uL (ref 3.80–5.20)
RDW: 14.1 % (ref 11.5–14.5)
WBC: 6.5 10*3/uL (ref 3.6–11.0)

## 2018-02-05 LAB — TROPONIN I

## 2018-02-05 MED ORDER — FLUTICASONE PROPIONATE 50 MCG/ACT NA SUSP
1.0000 | Freq: Every day | NASAL | Status: DC
  Filled 2018-02-05: qty 16

## 2018-02-05 MED ORDER — ENOXAPARIN SODIUM 40 MG/0.4ML ~~LOC~~ SOLN
40.0000 mg | SUBCUTANEOUS | Status: DC
  Administered 2018-02-05 – 2018-02-06 (×2): 40 mg via SUBCUTANEOUS
  Filled 2018-02-05 (×2): qty 0.4

## 2018-02-05 MED ORDER — SODIUM CHLORIDE 0.9 % IV SOLN: 1000.0000 mL | Freq: Once | INTRAVENOUS | Status: DC

## 2018-02-05 MED ORDER — SODIUM CHLORIDE 0.9 % IV SOLN
1000.0000 mL | Freq: Once | INTRAVENOUS | Status: AC
  Administered 2018-02-05: 1000 mL via INTRAVENOUS

## 2018-02-05 MED ORDER — BISACODYL 5 MG PO TBEC: 5.0000 mg | DELAYED_RELEASE_TABLET | Freq: Every day | ORAL | Status: DC | PRN

## 2018-02-05 MED ORDER — FAMOTIDINE 20 MG PO TABS: 10.0000 mg | ORAL_TABLET | Freq: Every day | ORAL | Status: DC

## 2018-02-05 MED ORDER — METFORMIN HCL 500 MG PO TABS
500.0000 mg | ORAL_TABLET | Freq: Two times a day (BID) | ORAL | Status: DC
  Administered 2018-02-05 – 2018-02-06 (×3): 500 mg via ORAL
  Filled 2018-02-05 (×3): qty 1

## 2018-02-05 MED ORDER — RANITIDINE HCL 150 MG/10ML PO SYRP
300.0000 mg | ORAL_SOLUTION | Freq: Every day | ORAL | Status: DC
  Administered 2018-02-06: 300 mg via ORAL
  Filled 2018-02-05 (×4): qty 20

## 2018-02-05 MED ORDER — CYANOCOBALAMIN 500 MCG PO TABS
250.0000 ug | ORAL_TABLET | Freq: Every day | ORAL | Status: DC
  Filled 2018-02-05 (×3): qty 1

## 2018-02-05 MED ORDER — ONDANSETRON HCL 4 MG/2ML IJ SOLN: 4.0000 mg | Freq: Four times a day (QID) | INTRAMUSCULAR | Status: DC | PRN

## 2018-02-05 MED ORDER — DOCUSATE SODIUM 100 MG PO CAPS
100.0000 mg | ORAL_CAPSULE | Freq: Two times a day (BID) | ORAL | Status: DC
  Administered 2018-02-05 – 2018-02-06 (×3): 100 mg via ORAL
  Filled 2018-02-05 (×3): qty 1

## 2018-02-05 MED ORDER — LOSARTAN POTASSIUM 50 MG PO TABS
50.0000 mg | ORAL_TABLET | Freq: Every day | ORAL | Status: DC
  Administered 2018-02-05 – 2018-02-06 (×2): 50 mg via ORAL
  Filled 2018-02-05 (×2): qty 1

## 2018-02-05 MED ORDER — ONDANSETRON HCL 4 MG PO TABS: 4.0000 mg | ORAL_TABLET | Freq: Four times a day (QID) | ORAL | Status: DC | PRN

## 2018-02-05 MED ORDER — TRAZODONE HCL 50 MG PO TABS: 25.0000 mg | ORAL_TABLET | Freq: Every evening | ORAL | Status: DC | PRN

## 2018-02-05 MED ORDER — HYDRALAZINE HCL 50 MG PO TABS
25.0000 mg | ORAL_TABLET | Freq: Three times a day (TID) | ORAL | Status: DC
  Administered 2018-02-05 – 2018-02-06 (×3): 25 mg via ORAL
  Filled 2018-02-05 (×3): qty 1

## 2018-02-05 MED ORDER — INSULIN ASPART 100 UNIT/ML ~~LOC~~ SOLN
0.0000 [IU] | Freq: Three times a day (TID) | SUBCUTANEOUS | Status: DC
Start: 2018-02-05 — End: 2018-02-07
  Administered 2018-02-06: 1 [IU] via SUBCUTANEOUS
  Filled 2018-02-05: qty 1

## 2018-02-05 MED ORDER — HYDRALAZINE HCL 20 MG/ML IJ SOLN
5.0000 mg | Freq: Once | INTRAMUSCULAR | Status: AC
  Administered 2018-02-05: 5 mg via INTRAVENOUS
  Filled 2018-02-05: qty 1

## 2018-02-05 MED ORDER — ACETAMINOPHEN 650 MG RE SUPP: 650.0000 mg | Freq: Four times a day (QID) | RECTAL | Status: DC | PRN

## 2018-02-05 MED ORDER — ACETAMINOPHEN 325 MG PO TABS
650.0000 mg | ORAL_TABLET | Freq: Four times a day (QID) | ORAL | Status: DC | PRN
  Administered 2018-02-06: 650 mg via ORAL

## 2018-02-05 MED ORDER — HYDRALAZINE HCL 20 MG/ML IJ SOLN: 10.0000 mg | Freq: Four times a day (QID) | INTRAMUSCULAR | Status: DC | PRN

## 2018-02-05 NOTE — H&P (Signed)
[facility] - [place] at [facility]   PATIENT NAME: [name]    MR#:  [id number]  DATE OF BIRTH:  07-Feb-1932  DATE OF ADMISSION:  02/05/2018  PRIMARY CARE PHYSICIAN: Duanne Limerick, MD   REQUESTING/REFERRING PHYSICIAN: Dr. Cyril Loosen  CHIEF COMPLAINT: Dizziness   Chief Complaint  Patient presents with  . Dizziness  . Fall    HISTORY OF PRESENT ILLNESS:  [name]  is a 84 y.o. female with a known history of essential hypertension, diabetes mellitus type 2 having dizziness since Saturday.  Patient stopped taking her BP medicine which has losartan and potassium combination.  According to pharmacy they recall the medicine patient did not get refills when she started to take her home BP medicines since Saturday.  Feeling dizzy since Saturday.  No chest pain.  Patient received 2 L of fluid in the emergency room and she still has dizziness.  Found to have low heart rate with heart rate up to 48 bpm.  BP elevated up to 206/100.  Received hydralazine.  Also received 2 L of normal saline.  Repeat blood pressure after hydralazine 5 mg IV push is 171/78.  PAST MEDICAL HISTORY:   Past Medical History:  Diagnosis Date  . GERD (gastroesophageal reflux disease)   . Hypertension   . Right knee DJD   . Type II diabetes mellitus (HCC)     PAST SURGICAL HISTOIRY:   Past Surgical History:  Procedure Laterality Date  . BACK SURGERY    . CATARACT EXTRACTION Bilateral 03/2016  . CERVICAL DISC SURGERY  12/28/2010   "bulging disc; went in on the left side of my neck" (08/19/2013)  . JOINT REPLACEMENT    . TOTAL KNEE ARTHROPLASTY Left 03/28/2011   Dr Thurston Hole  . TOTAL KNEE ARTHROPLASTY Right 08/19/2013  . TOTAL KNEE ARTHROPLASTY Right 08/19/2013   Procedure: TOTAL KNEE ARTHROPLASTY;  Surgeon: Nilda Simmer, MD;  Location: [facility] OR;  Service: Orthopedics;  Laterality: Right;    SOCIAL HISTORY:   Social History   Tobacco Use  . Smoking status: Never Smoker  . Smokeless  tobacco: Never Used  Substance Use Topics  . Alcohol use: No    FAMILY HISTORY:   Family History  Problem Relation Age of Onset  . Diabetes Mother     DRUG ALLERGIES:   Allergies  Allergen Reactions  . Nsaids     REVIEW OF SYSTEMS:  CONSTITUTIONAL: No fever, fatigue or weakness.  dizziness on and off since Saturday. EYES: No blurred or double vision.  EARS, NOSE, AND THROAT: No tinnitus or ear pain.  RESPIRATORY: No cough, shortness of breath, wheezing or hemoptysis.  CARDIOVASCULAR: No chest pain, orthopnea, edema.  GASTROINTESTINAL: No nausea, vomiting, diarrhea or abdominal pain.  GENITOURINARY: No dysuria, hematuria.  ENDOCRINE: No polyuria, nocturia,  HEMATOLOGY: No anemia, easy bruising or bleeding SKIN: No rash or lesion. MUSCULOSKELETAL: No joint pain or arthritis.   NEUROLOGIC: No tingling, numbness, weakness.  PSYCHIATRY: No anxiety or depression.   MEDICATIONS AT HOME:   Prior to Admission medications   Medication Sig Start Date End Date Taking? Authorizing Provider  acetaminophen (TYLENOL) 325 MG tablet Take 2 tablets (650 mg total) by mouth every 6 (six) hours as needed. 01/12/18   Duanne Limerick, MD  aspirin 81 MG tablet Take 81 mg by mouth daily.    [provider]  cyanocobalamin 100 MCG tablet Take 1 tablet by mouth daily. otc    [provider]  glucose blood (  ONE TOUCH ULTRA TEST) test strip USE ONE STRIP TO CHECK GLUCOSE ONCE DAILY 01/12/18   Duanne LimerickJones, Deanna C, MD  losartan (COZAAR) 50 MG tablet Take 1 tablet (50 mg total) by mouth daily. 01/12/18   Duanne LimerickJones, Deanna C, MD  metFORMIN (GLUCOPHAGE) 500 MG tablet Take 1 tablet (500 mg total) by mouth 2 (two) times daily with a meal. 01/12/18   Duanne LimerickJones, Deanna C, MD  mometasone (NASONEX) 50 MCG/ACT nasal spray Place 2 sprays into the nose daily. 01/12/18   Duanne LimerickJones, Deanna C, MD  ranitidine (ZANTAC) 300 MG tablet Take 1 tablet (300 mg total) by mouth at bedtime. 01/12/18   Duanne LimerickJones, Deanna C, MD       VITAL SIGNS:  Blood pressure (!) 171/78, pulse 60, temperature 98.5 F (36.9 C), temperature source Oral, resp. rate 18, height 5\' 5"  (1.651 m), weight 87.1 kg (192 lb), SpO2 96 %.  PHYSICAL EXAMINATION:  GENERAL:  84 y.o.-year-old patient lying in the bed with no acute distress.  EYES: Pupils equal, round, reactive to light  accommodation. No scleral icterus. Extraocular muscles intact.  HEENT: Head atraumatic, normocephalic. Oropharynx and nasopharynx clear.  NECK:  Supple, no jugular venous distention. No thyroid enlargement, no tenderness.  LUNGS: Normal breath sounds bilaterally, no wheezing, rales,rhonchi or crepitation. No use of accessory muscles of respiration.  CARDIOVASCULAR: S1, S2 normal. No murmurs, rubs, or gallops.  ABDOMEN: Soft, nontender, nondistended. Bowel sounds present. No organomegaly or mass.  EXTREMITIES: No pedal edema, cyanosis, or clubbing.  NEUROLOGIC: Cranial nerves II through XII are intact. Muscle strength 5/5 in all extremities. Sensation intact. Gait not checked.  PSYCHIATRIC: The patient is alert and oriented x 3.  SKIN: No obvious rash, lesion, or ulcer.   LABORATORY PANEL:   CBC Recent Labs  Lab 02/05/18 1124  WBC 6.5  HGB 12.8  HCT 39.2  PLT 266   ------------------------------------------------------------------------------------------------------------------  Chemistries  Recent Labs  Lab 02/05/18 1124  NA 132*  K 4.9  CL 101  CO2 21*  GLUCOSE 159*  BUN 22*  CREATININE 0.66  CALCIUM 9.0  AST 14*  ALT 12*  ALKPHOS 78  BILITOT 0.5   ------------------------------------------------------------------------------------------------------------------  Cardiac Enzymes Recent Labs  Lab 02/05/18 1124  TROPONINI <0.03   ------------------------------------------------------------------------------------------------------------------  RADIOLOGY:  Dg Chest Portable 1 View  Result Date: 02/05/2018 CLINICAL DATA:  Patient  became dizzy and fell yesterday. EXAM: PORTABLE CHEST 1 VIEW COMPARISON:  08/12/2013. FINDINGS: The heart is enlarged. There is no consolidation or edema. No effusion or pneumothorax. Skeletal osteopenia. Tortuous aorta. Similar appearance to priors. IMPRESSION: Cardiomegaly.  No active disease. Electronically Signed   By: Elsie StainJohn T Curnes M.D.   On: 02/05/2018 13:19    EKG:   Orders placed or performed during the hospital encounter of 02/05/18  . EKG 12-Lead  . EKG 12-Lead  . ED EKG  . ED EKG  EKG shows sinus bradycardia 57 bpm.  Noted to have nonspecific T wave inversions in lead V5, V6.  IMPRESSION AND PLAN:   84 year old female patient with essential hypertension, diabetes mellitus type 2 has been having dizziness since Saturday. 1.  Dizziness likely coming from combination of uncontrolled hypertension and also bradycardia.  Admitted to telemetry, patient will get losartan, hydralazine for blood pressure control.  Admit to observation status.  Likely discharge tomorrow. 2.  Sinus bradycardia: Patient not on any AV nodal blocking agents.  Monitor on telemetry, echocardiogram ordered. Primary team can Evaluate for the need for cardiology consult,if pt has dizziness, bradycardia. #3.  diabetes mellitus type 2: Continue metformin, sliding scale insulin with coverage. 4.  Physical therapy evaluation as per family request.  Patient has history of bilateral knee replacements had numerous pain at home. All the records are reviewed and case discussed with ED provider. Management plans discussed with the patient, family and they are in agreement.  CODE STATUS: full  TOTAL TIME TAKING CARE OF THIS PATIENT: 55 minutes.    Katha Hamming M.D on 02/05/2018 at 3:31 PM  Between 7am to 6pm - Pager - [phone]  After 6pm go to [email or web address] - password [email or web address]  [facility]  Office  [phone]  CC: Primary care physician; Duanne Limerick, MD  Note: This dictation was  prepared with Dragon dictation along with smaller phrase technology. Any transcriptional errors that result from this process are unintentional.

## 2018-02-05 NOTE — ED Provider Notes (Signed)
[facility] Emergency Department Provider Note   ____________________________________________    I have reviewed the triage vital signs and the nursing notes.   HISTORY  Chief Complaint Dizziness and Fall     HPI [name] is a 84 y.o. female who presents with complaints of dizziness.  Patient reports yesterday starting around lunchtime she felt lightheaded for most of the day, she reports it is worse when standing up, today she feels significant better although still some mild lightheadedness.  Denies chest pain or palpitations.  No abdominal pain nausea vomiting or diarrhea.  She recently stopped her blood pressure medication because apparently it is being recalled per her pharmacist.  No fevers or chills.  No recent travel.  No calf pain or swelling.   Past Medical History:  Diagnosis Date  . GERD (gastroesophageal reflux disease)   . Hypertension   . Right knee DJD   . Type II diabetes mellitus [facility](HCC)     Patient Active Problem List   Diagnosis Date Noted  . 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 Hyperlipidemia 09/24/2015  . Diabetes mellitus with no complication (HCC) 03/16/2015  . 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 Arthritis of knee, degenerative 03/16/2015  . Sinus infection 03/16/2015  . Diabetes (HCC)   . Essential hypertension   . GERD (gastroesophageal reflux disease)   . Status post total left knee replacement     Past Surgical History:  Procedure Laterality Date  . BACK SURGERY    . CATARACT EXTRACTION Bilateral 03/2016  . CERVICAL DISC SURGERY  12/28/2010   "bulging disc; went in on the left side of my neck" (08/19/2013)  . JOINT REPLACEMENT    . TOTAL KNEE ARTHROPLASTY Left 03/28/2011   Dr Thurston HoleWainer  . TOTAL KNEE ARTHROPLASTY Right 08/19/2013  . TOTAL KNEE ARTHROPLASTY Right 08/19/2013   Procedure: TOTAL KNEE ARTHROPLASTY;  Surgeon: Nilda Simmerobert A Wainer,  MD;  Location: [facility] OR;  Service: Orthopedics;  Laterality: Right;    Prior to Admission medications   Medication Sig Start Date End Date Taking? Authorizing Provider  acetaminophen (TYLENOL) 325 MG tablet Take 2 tablets (650 mg total) by mouth every 6 (six) hours as needed. 01/12/18   Duanne LimerickJones, Deanna C, MD  aspirin 81 MG tablet Take 81 mg by mouth daily.    [provider]  cyanocobalamin 100 MCG tablet Take 1 tablet by mouth daily. otc    [provider]  glucose blood (ONE TOUCH ULTRA TEST) test strip USE ONE STRIP TO CHECK GLUCOSE ONCE DAILY 01/12/18   Duanne LimerickJones, Deanna C, MD  losartan (COZAAR) 50 MG tablet Take 1 tablet (50 mg total) by mouth daily. 01/12/18   Duanne LimerickJones, Deanna C, MD  metFORMIN (GLUCOPHAGE) 500 MG tablet Take 1 tablet (500 mg total) by mouth 2 (two) times daily with a meal. 01/12/18   Duanne LimerickJones, Deanna C, MD  mometasone (NASONEX) 50 MCG/ACT nasal spray Place 2 sprays into the nose daily. 01/12/18   Duanne LimerickJones, Deanna C, MD  ranitidine (ZANTAC) 300 MG tablet Take 1 tablet (300 mg total) by mouth at bedtime. 01/12/18   Duanne LimerickJones, Deanna C, MD     Allergies Nsaids  Family History  Problem Relation Age of Onset  . Diabetes Mother     Social History Social History   Tobacco Use  . Smoking status: Never Smoker  . Smokeless tobacco: Never Used  Substance Use Topics  . Alcohol use: No  .  Drug use: No    Review of Systems  Constitutional: Lightheadedness as above Eyes: No visual changes.  ENT: No sore throat. Cardiovascular: Denies chest pain. Respiratory: Denies shortness of breath. Gastrointestinal: No abdominal pain.   Genitourinary: Negative for dysuria. Musculoskeletal: Negative for back pain. Skin: Negative for rash. Neurological: Negative for headaches or weakness   ____________________________________________   PHYSICAL EXAM:  VITAL SIGNS: ED Triage Vitals  Enc Vitals Group     BP 02/05/18 1110 (!) 175/56     Pulse Rate 02/05/18 1110 60     Resp  02/05/18 1110 18     Temp 02/05/18 1110 98.5 F (36.9 C)     Temp Source 02/05/18 1110 Oral     SpO2 02/05/18 1110 96 %     Weight 02/05/18 1110 87.1 kg (192 lb)     Height 02/05/18 1110 1.651 m (5\' 5" )     Head Circumference --      Peak Flow --      Pain Score 02/05/18 1120 0     Pain Loc --      Pain Edu? --      Excl. in GC? --     Constitutional: Alert and oriented. No acute distress. Pleasant and interactive  Nose: No congestion/rhinnorhea. Mouth/Throat: Mucous membranes are moist.   Neck:  Painless ROM Cardiovascular: Normal rate, regular rhythm. Grossly normal heart sounds.  Good peripheral circulation. Respiratory: Normal respiratory effort.  No retractions. Lungs CTAB. Gastrointestinal: Soft and nontender. No distention.  No CVA tenderness. Genitourinary: deferred Musculoskeletal: No lower extremity tenderness nor edema.  Warm and well perfused Neurologic:  Normal speech and language. No gross focal neurologic deficits are appreciated.  Skin:  Skin is warm, dry and intact. No rash noted. Psychiatric: Mood and affect are normal. Speech and behavior are normal.  ____________________________________________   LABS (all labs ordered are listed, but only abnormal results are displayed)  Labs Reviewed  URINALYSIS, COMPLETE (UACMP) WITH MICROSCOPIC - Abnormal; Notable for the following components:      Result Value   Color, Urine YELLOW (*)    APPearance CLEAR (*)    Bacteria, UA RARE (*)    Squamous Epithelial / LPF 0-5 (*)    All other components within normal limits  COMPREHENSIVE METABOLIC PANEL - Abnormal; Notable for the following components:   Sodium 132 (*)    CO2 21 (*)    Glucose, Bld 159 (*)    BUN 22 (*)    AST 14 (*)    ALT 12 (*)    All other components within normal limits  CBC  TROPONIN I   ____________________________________________  EKG  ED ECG REPORT I, Jene Every, the attending physician, personally viewed and interpreted this  ECG.  Date: 02/05/2018  Rhythm: Sinus bradycardia QRS Axis: Left axis deviation Intervals: normal ST/T Wave abnormalities: normal   ____________________________________________  RADIOLOGY  Chest x-ray shows cardiomegaly ____________________________________________   PROCEDURES  Procedure(s) performed: No  Procedures   Critical Care performed: No ____________________________________________   INITIAL IMPRESSION / ASSESSMENT AND PLAN / ED COURSE  Pertinent labs & imaging results that were available during my care of the patient were reviewed by me and considered in my medical decision making (see chart for details).  Patient presents with lightheadedness, she is mildly bradycardic on the monitor.  She denies chest pain or shortness of breath.  Lab work is consistent with mild dehydration, this may be the cause of her lightheadedness or perhaps her bradycardia which appears  to be somewhat new.  We will give IV fluids and reevaluate  After fluids we performed orthostatics, patient became markedly hypertensive with standing and became quite dizzy.  Low-dose IV hydralazine given, will admit to the hospital service for further evaluation blood pressure control and management of bradycardia and dizziness    ____________________________________________   FINAL CLINICAL IMPRESSION(S) / ED DIAGNOSES  Final diagnoses:  Syncope, unspecified syncope type  Hypertension, unspecified type  Bradycardia        Note:  This document was prepared using Dragon voice recognition software and may include unintentional dictation errors.    Jene Every, MD 02/05/18 [phone]

## 2018-02-05 NOTE — ED Notes (Signed)
Pt states that her pharmacy stated that her losartan-potassium BP medication was recalled so she had to stop taking them due to not having any more.  Pt reports that she started feeling dizzy on Saturday which is the day after she stopped taking the pills.  Pt is  A&Ox4, in NAD.

## 2018-02-05 NOTE — ED Triage Notes (Signed)
States got dizzy and fell approx 7pm yesterday. Denies LOC. Denies chest pain or SOB. States still slightly dizzy today

## 2018-02-06 ENCOUNTER — Observation Stay (HOSPITAL_BASED_OUTPATIENT_CLINIC_OR_DEPARTMENT_OTHER)
Admit: 2018-02-06 | Discharge: 2018-02-06 | Disposition: A | Discharge status: Home / Self Care | Payer: Medicare Other | Attending: Internal Medicine | Admitting: Internal Medicine

## 2018-02-06 ENCOUNTER — Encounter: Payer: Self-pay | Admitting: Internal Medicine

## 2018-02-06 DIAGNOSIS — E538 Deficiency of other specified B group vitamins: Secondary | ICD-10-CM | POA: Diagnosis not present

## 2018-02-06 DIAGNOSIS — I10 Essential (primary) hypertension: Secondary | ICD-10-CM | POA: Diagnosis not present

## 2018-02-06 DIAGNOSIS — R42 Dizziness and giddiness: Secondary | ICD-10-CM | POA: Diagnosis not present

## 2018-02-06 DIAGNOSIS — R55 Syncope and collapse: Secondary | ICD-10-CM | POA: Diagnosis not present

## 2018-02-06 DIAGNOSIS — I351 Nonrheumatic aortic (valve) insufficiency: Secondary | ICD-10-CM

## 2018-02-06 DIAGNOSIS — R531 Weakness: Secondary | ICD-10-CM | POA: Diagnosis not present

## 2018-02-06 DIAGNOSIS — R001 Bradycardia, unspecified: Secondary | ICD-10-CM

## 2018-02-06 DIAGNOSIS — E119 Type 2 diabetes mellitus without complications: Secondary | ICD-10-CM | POA: Diagnosis not present

## 2018-02-06 LAB — GLUCOSE, CAPILLARY
GLUCOSE-CAPILLARY: 102 mg/dL — AB (ref 65–99)
GLUCOSE-CAPILLARY: 139 mg/dL — AB (ref 65–99)
Glucose-Capillary: 110 mg/dL — ABNORMAL HIGH (ref 65–99)
Glucose-Capillary: 127 mg/dL — ABNORMAL HIGH (ref 65–99)

## 2018-02-06 LAB — CBC
HEMATOCRIT: 35.8 % (ref 35.0–47.0)
HEMOGLOBIN: 11.5 g/dL — AB (ref 12.0–16.0)
MCH: 27.9 pg (ref 26.0–34.0)
MCHC: 32.2 g/dL (ref 32.0–36.0)
MCV: 86.5 fL (ref 80.0–100.0)
Platelets: 266 10*3/uL (ref 150–440)
RBC: 4.14 MIL/uL (ref 3.80–5.20)
RDW: 14 % (ref 11.5–14.5)
WBC: 6.2 10*3/uL (ref 3.6–11.0)

## 2018-02-06 LAB — BASIC METABOLIC PANEL
ANION GAP: 8 (ref 5–15)
BUN: 20 mg/dL (ref 6–20)
CHLORIDE: 108 mmol/L (ref 101–111)
CO2: 22 mmol/L (ref 22–32)
Calcium: 8.9 mg/dL (ref 8.9–10.3)
Creatinine, Ser: 0.74 mg/dL (ref 0.44–1.00)
GFR calc Af Amer: >60 mL/min (ref >60)
GLUCOSE: 101 mg/dL — AB (ref 65–99)
POTASSIUM: 4.2 mmol/L (ref 3.5–5.1)
Sodium: 138 mmol/L (ref 135–145)

## 2018-02-06 LAB — ECHOCARDIOGRAM COMPLETE
HEIGHTINCHES: 65 in
Weight: 3056 oz

## 2018-02-06 LAB — TSH: TSH: 3.394 u[IU]/mL (ref 0.350–4.500)

## 2018-02-06 LAB — T4, FREE: Free T4: 1.13 ng/dL — ABNORMAL HIGH (ref 0.61–1.12)

## 2018-02-06 LAB — TROPONIN I: Troponin I: <0.03 ng/mL (ref <0.03)

## 2018-02-06 MED ORDER — HYDRALAZINE HCL 10 MG PO TABS
10.0000 mg | ORAL_TABLET | Freq: Three times a day (TID) | ORAL | Status: DC
  Filled 2018-02-06: qty 1

## 2018-02-06 MED ORDER — AMLODIPINE BESYLATE 5 MG PO TABS
2.5000 mg | ORAL_TABLET | Freq: Every day | ORAL | Status: DC
  Administered 2018-02-07: 2.5 mg via ORAL
  Filled 2018-02-06: qty 1

## 2018-02-06 MED ORDER — AMLODIPINE BESYLATE 5 MG PO TABS
5.0000 mg | ORAL_TABLET | Freq: Every day | ORAL | Status: DC
  Administered 2018-02-06: 5 mg via ORAL
  Filled 2018-02-06: qty 1

## 2018-02-06 NOTE — Care Management Note (Signed)
Case Management Note  Patient Details  Name: [name] MRN: [id number] Date of Birth: 1931/11/25  Subjective/Objective:                  RNCM met with patient, her daughter and her son in law to determine what "equipment" is needed and to discuss [place] letter.  She requests a rollator walker which has been requested from MD and Corene Cornea with [facility]. Patient states she lives with a son and depends on them for transportation.  She denies difficulty obtaining medications however she depends on her children to assist with that.  Her PCP is Dr. Ronnald Ramp of [place].  Action/Plan:   Rollator requested. Expected Discharge Date:                  Expected Discharge Plan:     In-House Referral:     Discharge planning Services  CM Consult  Post Acute Care Choice:  Durable Medical Equipment Choice offered to:  Patient, Adult Children  DME Arranged:  Walker rolling with seat DME Agency:     HH Arranged:    [place]:  [place]  Status of Service:  Completed, signed off  If discussed at [place] of Stay Meetings, dates discussed:    Additional Comments:  Marshell Garfinkel, RN 02/06/2018, 10:20 AM

## 2018-02-06 NOTE — Evaluation (Signed)
Physical Therapy Evaluation Patient Details Name: [name] MRN: [id number] DOB: December 14, 1931 Today's Date: 02/06/2018   History of Present Illness  84 yo female with onset of HTN CAD, dizziness and fall prior to her admission.  Pt is home with daughter and asking for a rollator walker.  PMHx:  HTN, B OA knees, spine surgery  Clinical Impression  Pt was seen for evaluation of mobility with assistance for her RW and dizziness along with dramatically different RLE strength.  Her plan is to get walking with greater safety and will need to upgrade from the quad cane she has been using to RW for now.  Also asking for rollator and discussed with family that she cannot use one yet.  The case manager and MD will need to decide the feasibility of this device.  HHPT for follow up therapy and strengthening.      Follow Up Recommendations Home health PT;Supervision for mobility/OOB    Equipment Recommendations  Rolling walker with 5" wheels;Other (comment)(requesting rollator but not functionally ready)    Recommendations for Other Services       Precautions / Restrictions Precautions Precautions: Fall(telemetry) Restrictions Weight Bearing Restrictions: No      Mobility  Bed Mobility Overal bed mobility: Modified Independent                Transfers Overall transfer level: Needs assistance Equipment used: Rolling walker (2 wheeled);1 person hand held assist Transfers: Sit to/from Stand Sit to Stand: Min assist;From elevated surface         General transfer comment: cued sequence and use of walker as pt tends to set aside  Ambulation/Gait Ambulation/Gait assistance: Min assist;Min guard Ambulation Distance (Feet): 40 Feet Assistive device: Rolling walker (2 wheeled);1 person hand held assist Gait Pattern/deviations: Step-to pattern;Step-through pattern;Decreased stride length;Wide base of support;Decreased weight shift to left Gait velocity: reduced Gait velocity  interpretation: Below normal speed for age/gender General Gait Details: pt has L leg preference due to weakness on RLE  Stairs            Wheelchair Mobility    Modified Rankin (Stroke Patients Only)       Balance Overall balance assessment: History of Falls                                           Pertinent Vitals/Pain Pain Assessment: Faces Faces Pain Scale: Hurts a little bit Pain Location: knees with mobility Pain Intervention(s): Monitored during session;Repositioned    Home Living Family/patient expects to be discharged to:: Private residence Living Arrangements: Children Available Help at Discharge: Family;Available 24 hours/day Type of Home: House Home Access: Level entry     Home Layout: One level Home Equipment: Walker - 2 wheels;Cane - quad Additional Comments: family asking for rollator to assist with trips out of the house    Prior Function Level of Independence: Needs assistance   Gait / Transfers Assistance Needed: able to walk with quad cane independently in house  ADL's / Homemaking Assistance Needed: daughter helps pt with self care and does all housework        Hand Dominance   Dominant Hand: Right    Extremity/Trunk Assessment   Upper Extremity Assessment Upper Extremity Assessment: Overall WFL for tasks assessed    Lower Extremity Assessment Lower Extremity Assessment: RLE deficits/detail RLE Deficits / Details: hip 4-, knee ext 4+ and flexion 4-  RLE Coordination: decreased fine motor;decreased gross motor    Cervical / Trunk Assessment Cervical / Trunk Assessment: Kyphotic  Communication   Communication: No difficulties  Cognition Arousal/Alertness: Awake/alert Behavior During Therapy: WFL for tasks assessed/performed Overall Cognitive Status: Within Functional Limits for tasks assessed                                        General Comments General comments (skin integrity, edema,  etc.): pt has dizziness in sitting but less than standing, however has instability with RW needed for all standing    Exercises     Assessment/Plan    PT Assessment Patient needs continued PT services  PT Problem List Decreased strength;Decreased range of motion;Decreased activity tolerance;Decreased balance;Decreased coordination;Decreased mobility;Decreased knowledge of use of DME;Decreased safety awareness;Cardiopulmonary status limiting activity;Obesity;Pain       PT Treatment Interventions DME instruction;Gait training;Functional mobility training;Therapeutic activities;Therapeutic exercise;Balance training;Neuromuscular re-education;Patient/family education    PT Goals (Current goals can be found in the Care Plan section)  Acute Rehab PT Goals Patient Stated Goal: to walk and get home PT Goal Formulation: With patient/family Time For Goal Achievement: 02/20/18 Potential to Achieve Goals: Good    Frequency Min 2X/week   Barriers to discharge Decreased caregiver support(only one person to assist with daughter) unsteady on her feet    Co-evaluation               AM-PAC PT "6 Clicks" Daily Activity  Outcome Measure Difficulty turning over in bed (including adjusting bedclothes, sheets and blankets)?: None Difficulty moving from lying on back to sitting on the side of the bed? : A Little Difficulty sitting down on and standing up from a chair with arms (e.g., wheelchair, bedside commode, etc,.)?: Unable Help needed moving to and from a bed to chair (including a wheelchair)?: A Little Help needed walking in hospital room?: A Little Help needed climbing 3-5 steps with a railing? : Total 6 Click Score: 15    End of Session Equipment Utilized During Treatment: Gait belt Activity Tolerance: Patient tolerated treatment well;Patient limited by fatigue Patient left: in bed;with call bell/phone within reach;with family/visitor present Nurse Communication: Mobility status PT  Visit Diagnosis: Unsteadiness on feet (R26.81);History of falling (Z91.81);Muscle weakness (generalized) (M62.81);Difficulty in walking, not elsewhere classified (R26.2);Dizziness and giddiness (R42)    Time: [id number] PT Time Calculation (min) (ACUTE ONLY): 31 min   Charges:   PT Evaluation $PT Eval Moderate Complexity: 1 Mod PT Treatments $Gait Training: 8-22 mins   PT G Codes:   PT G-Codes **NOT FOR INPATIENT CLASS** Functional Assessment Tool Used: AM-PAC 6 Clicks Basic Mobility    Ivar DrapeRuth E Kiko Ripp 02/06/2018, 1:52 PM   Samul Dadauth Timothy Trudell, PT MS Acute Rehab Dept. Number: [facility] [id number] and [facility] [phone]

## 2018-02-06 NOTE — Consult Note (Signed)
Cardiology Consultation:   Patient ID: [name]; [id number]; [birth year]   Admit date: 02/05/2018 Date of Consult: 02/06/2018  Primary Care Provider: Duanne Limerick, MD Primary Cardiologist: New - Bronda Alfred Primary Electrophysiologist:  None   Patient Profile:   [name] is a 84 y.o. female with a hx of type II diabetes mellitus and hypertension who is being seen today for the evaluation of dizziness in the setting of bradycardia at the request of Dr. Renae Gloss.  History of Present Illness:   [name] began having dizziness 2-3 days ago, which was preceded by a patient discontinuing losartan due to medication recall.  She notes that on Friday and Saturday, she did not take her prescribed losartan.  Sunday (2 days ago), while walking in her home, she suddenly felt lightheaded and fell to the floor.  She does not believe that she passed out.  However, she is continued to feel lightheaded since that time when she stands.  This is a new problem for her.  In the emergency department, she was found to be bradycardic with a heart rate of 48 bpm.  Blood pressure was severely elevated at 206/100, improved to 171/78 following IV hydralazine.  [name] denies any other symptoms including chest pain, shortness of breath, palpitations, orthopnea, and edema.  She denies prior cardiac problems and testing.  However, her family notes that her PCP told [name] that her heart rate was slow at their last visit in late February.  Past Medical History:  Diagnosis Date  . GERD (gastroesophageal reflux disease)   . Hypertension   . Right knee DJD   . Type II diabetes mellitus (HCC)     Past Surgical History:  Procedure Laterality Date  . BACK SURGERY    . CATARACT EXTRACTION Bilateral 03/2016  . CERVICAL DISC SURGERY  12/28/2010   "bulging disc; went in on the left side of my neck" (08/19/2013)  . JOINT REPLACEMENT    . TOTAL KNEE ARTHROPLASTY Left 03/28/2011   Dr Thurston Hole  . TOTAL KNEE  ARTHROPLASTY Right 08/19/2013  . TOTAL KNEE ARTHROPLASTY Right 08/19/2013   Procedure: TOTAL KNEE ARTHROPLASTY;  Surgeon: Nilda Simmer, MD;  Location: [facility] OR;  Service: Orthopedics;  Laterality: Right;     Home Medications:  Prior to Admission medications   Medication Sig Start Date Natalina Wieting Date Taking? Authorizing Provider  acetaminophen (TYLENOL) 325 MG tablet Take 2 tablets (650 mg total) by mouth every 6 (six) hours as needed. 01/12/18  Yes Duanne Limerick, MD  aspirin 81 MG tablet Take 81 mg by mouth daily.   Yes [provider]  cyanocobalamin 100 MCG tablet Take 1 tablet by mouth daily.    Yes [provider]  diclofenac sodium (VOLTAREN) 1 % GEL Apply 4 g topically 4 (four) times daily as needed. 01/23/18  Yes [provider]  glucose blood (ONE TOUCH ULTRA TEST) test strip USE ONE STRIP TO CHECK GLUCOSE ONCE DAILY 01/12/18  Yes Duanne Limerick, MD  losartan (COZAAR) 50 MG tablet Take 1 tablet (50 mg total) by mouth daily. 01/12/18  Yes Duanne Limerick, MD  metFORMIN (GLUCOPHAGE) 500 MG tablet Take 1 tablet (500 mg total) by mouth 2 (two) times daily with a meal. 01/12/18  Yes Duanne Limerick, MD  mometasone (NASONEX) 50 MCG/ACT nasal spray Place 2 sprays into the nose daily. 01/12/18  Yes Duanne Limerick, MD  ranitidine (ZANTAC) 300 MG tablet Take 1 tablet (300 mg total) by mouth  at bedtime. 01/12/18  Yes Duanne Limerick, MD    Inpatient Medications: Scheduled Meds: . [START ON 02/07/2018] amLODipine  2.5 mg Oral Daily  . cyanocobalamin  250 mcg Oral Daily  . docusate sodium  100 mg Oral BID  . enoxaparin (LOVENOX) injection  40 mg Subcutaneous Q24H  . fluticasone  1 spray Each Nare Daily  . insulin aspart  0-9 Units Subcutaneous TID WC  . losartan  50 mg Oral Daily  . metFORMIN  500 mg Oral BID WC  . ranitidine  300 mg Oral QHS   Continuous Infusions:  PRN Meds: acetaminophen **OR** acetaminophen, bisacodyl, ondansetron **OR** ondansetron (ZOFRAN) IV,  traZODone  Allergies:    Allergies  Allergen Reactions  . Nsaids     Social History:   Social History   Socioeconomic History  . Marital status: Widowed    Spouse name: Not on file  . Number of children: Not on file  . Years of education: Not on file  . Highest education level: Not on file  Social Needs  . Financial resource strain: Not on file  . Food insecurity - worry: Not on file  . Food insecurity - inability: Not on file  . Transportation needs - medical: Not on file  . Transportation needs - non-medical: Not on file  Occupational History  . Not on file  Tobacco Use  . Smoking status: Never Smoker  . Smokeless tobacco: Never Used  Substance and Sexual Activity  . Alcohol use: No  . Drug use: No  . Sexual activity: No  Other Topics Concern  . Not on file  Social History Narrative  . Not on file    Family History:   Family History  Problem Relation Age of Onset  . Diabetes Mother   . Cerebral aneurysm Father      ROS:  Review of Systems  Constitutional: Negative.   HENT: Negative.   Eyes: Negative.   Respiratory: Negative.   Cardiovascular: Negative.   Gastrointestinal: Negative.   Genitourinary: Negative.   Musculoskeletal: Positive for falls.  Skin: Negative.   Neurological: Positive for dizziness.  Endo/Heme/Allergies: Negative.   Psychiatric/Behavioral: Negative.    Physical Exam/Data:   Vitals:   02/05/18 1912 02/06/18 0325 02/06/18 0751 02/06/18 1525  BP: (!) 137/46 (!) 126/53 (!) 147/62 (!) 163/61  Pulse: 61 (!) 56 (!) 52 (!) 55  Resp: 18 17  16   Temp: 97.8 F (36.6 C) 98.3 F (36.8 C) 97.7 F (36.5 C) 98 F (36.7 C)  TempSrc: Oral  Oral Oral  SpO2: 100% 100% 100% 99%  Weight:  191 lb (86.6 kg)    Height:        Intake/Output Summary (Last 24 hours) at 02/06/2018 1714 Last data filed at 02/06/2018 1133 Gross per 24 hour  Intake 240 ml  Output 802 ml  Net -562 ml   Filed Weights   02/05/18 1110 02/05/18 1120 02/06/18 0325    Weight: 192 lb (87.1 kg) 192 lb (87.1 kg) 191 lb (86.6 kg)   Body mass index is 31.78 kg/m.  General:  Well nourished, well developed, in no acute distress. HEENT: normal Lymph: no adenopathy Neck: no JVD Endocrine:  No thryomegaly Vascular: No carotid bruits; FA pulses 2+ bilaterally without bruits  Cardiac: Bradycardic but regular with 1/6 holosystolic murmur loudest at the left lower sternal border.  No rubs or gallops.  Nondisplaced PMI. Lungs:  Clear to auscultation bilaterally, no wheezing, rhonchi or rales  Abd: Soft, nontender, no  hepatomegaly  Ext: No lower extremity edema. Musculoskeletal:  No deformities, BUE and BLE strength normal and equal Skin: warm and dry  Neuro:  CNs 2-12 intact, no focal abnormalities noted Psych:  Normal affect   EKG:  The EKG was personally reviewed and demonstrates: Sinus bradycardia with left axis deviation and left bundle branch block.  Compared with prior tracing from 08/19/13, heart rate has actually increased.  Left bundle branch block is new. Telemetry:  Telemetry was personally reviewed and demonstrates: Sinus bradycardia and normal sinus rhythm with occasional PVCs and PACs.  Brief atrial run also noted.  Heart rate 49-92 bpm.  Relevant CV Studies: Echocardiogram (02/06/18): - Procedure narrative: Transthoracic echocardiography. Image   quality was suboptimal. - Left ventricle: The cavity size was normal. Wall thickness could   not be accurately determined. Systolic function was vigorous. The   estimated ejection fraction was in the range of 65% to 70%.   Doppler parameters are consistent with abnormal left ventricular   relaxation (grade 1 diastolic dysfunction). Doppler parameters   are consistent with high ventricular filling pressure. - Aortic valve: There was probably mild to moderate regurgitation,   though suboptimal acoustic windows limit evaluation. - Left atrium: The atrium was mildly dilated. - Right ventricle: The cavity  size was normal. Systolic function   was normal. - Pulmonary arteries: Systolic pressure was mildly to moderately   increased, in the range of 35 mm Hg to 40 mm Hg plus central   venous/right atrial pressure.  Laboratory Data:  Chemistry Recent Labs  Lab 02/05/18 1124 02/06/18 0641  NA 132* 138  K 4.9 4.2  CL 101 108  CO2 21* 22  GLUCOSE 159* 101*  BUN 22* 20  CREATININE 0.66 0.74  CALCIUM 9.0 8.9  GFRNONAA >60 >60  GFRAA >60 >60  ANIONGAP 10 8    Recent Labs  Lab 02/05/18 1124  PROT 7.7  ALBUMIN 3.6  AST 14*  ALT 12*  ALKPHOS 78  BILITOT 0.5   Hematology Recent Labs  Lab 02/05/18 1124 02/06/18 0641  WBC 6.5 6.2  RBC 4.50 4.14  HGB 12.8 11.5*  HCT 39.2 35.8  MCV 86.9 86.5  MCH 28.4 27.9  MCHC 32.7 32.2  RDW 14.1 14.0  PLT 266 266   Cardiac Enzymes Recent Labs  Lab 02/05/18 1124  TROPONINI <0.03   No results for input(s): TROPIPOC in the last 168 hours.  BNPNo results for input(s): BNP, PROBNP in the last 168 hours.  DDimer No results for input(s): DDIMER in the last 168 hours.   TSH: Not checked  Radiology/Studies:  Dg Chest Portable 1 View  Result Date: 02/05/2018 CLINICAL DATA:  Patient became dizzy and fell yesterday. EXAM: PORTABLE CHEST 1 VIEW COMPARISON:  08/12/2013. FINDINGS: The heart is enlarged. There is no consolidation or edema. No effusion or pneumothorax. Skeletal osteopenia. Tortuous aorta. Similar appearance to priors. IMPRESSION: Cardiomegaly.  No active disease. Electronically Signed   By: Elsie Stain M.D.   On: 02/05/2018 13:19    Assessment and Plan:   Sinus bradycardia Sinus rate increasing to the low 90s (presumably with activity) noted on telemetry shows that she is able to mount an appropriate heart rate response with activity.  Review of prior EKGs is notable for significant sinus bradycardia dating all the way back to 2014.  Given new finding of left bundle branch block, I think it is worthwhile to exclude ischemia.  We  will plan for a pharmacologic myocardial perfusion stress test tomorrow.  I will add troponin onto the most recent labs, given that only a single assay was drawn upon presentation (<0.03).  TSH and free T4 have also been ordered.  If these studies are all normal, no further inpatient cardiac workup will be necessary.  Lightheadedness and possible syncope This is a new problem for Ms. Vermeer since discontinuation of losartan.  It seems unusual that discontinuing an antihypertensive medication would elicit this response.  Blood pressure here has been somewhat elevated.  Interestingly, orthostatic vital signs indicate significant rise in blood pressure when going from lying to sitting to standing.  Echocardiogram is notable for mild to moderate aortic regurgitation, though I do not believe that this is a driving factor for the patient's lightheadedness.  I would encourage adequate fluid intake and ambulation again tomorrow to see if this reproduces Ms. Loder's symptoms.  Outpatient event monitor upon discharge could be considered to exclude other arrhythmias as a cause for her symptoms.  Aortic regurgitation Probably mild to moderate aortic regurgitation noted, though evaluation was suboptimal due to poor acoustic windows.  I do not suspect that this alone explains the patient's lightheadedness and possible syncope.  Blood pressure control will be important.  Hypertension Blood pressure has been labile but predominantly elevated, particularly on the systolic side.  Given recall of losartan, it would be reasonable to begin low-dose lisinopril.  Beta-blockers and calcium channel blockers should be avoided in the setting of baseline bradycardia.  For questions or updates, please contact [facility] Please consult [email or web address] for contact info under [facility].   Signed, Yvonne Kendallhristopher Merion Caton, MD  02/06/2018 5:14 PM

## 2018-02-06 NOTE — Progress Notes (Signed)
*  PRELIMINARY RESULTS* Echocardiogram 2D Echocardiogram has been performed.  Cristela BlueHege, [place] 02/06/2018, 9:33 AM

## 2018-02-06 NOTE — Progress Notes (Signed)
Patient ID: [name], female   DOB: August 16, 1932, 84 y.o.   MRN: [id number]  [employer] PROGRESS NOTE  [name] LOV:[id number] DOB: August 16, 1932 DOA: 02/05/2018 PCP: Duanne LimerickJones, Deanna C, MD  HPI/Subjective: Patient feeling dizzy and unsteady.  Daughter mentioned that she felt like she was going to pass out when  on the commode.  Objective: Vitals:   02/06/18 0325 02/06/18 0751  BP: (!) 126/53 (!) 147/62  Pulse: (!) 56 (!) 52  Resp: 17   Temp: 98.3 F (36.8 C) 97.7 F (36.5 C)  SpO2: 100% 100%    Filed Weights   02/05/18 1110 02/05/18 1120 02/06/18 0325  Weight: 87.1 kg (192 lb) 87.1 kg (192 lb) 86.6 kg (191 lb)    ROS: Review of Systems  Constitutional: Negative for chills and fever.  Eyes: Negative for blurred vision.  Respiratory: Negative for cough and shortness of breath.   Cardiovascular: Negative for chest pain.  Gastrointestinal: Negative for abdominal pain, constipation, diarrhea, nausea and vomiting.  Genitourinary: Negative for dysuria.  Musculoskeletal: Negative for joint pain.  Neurological: Positive for dizziness and weakness. Negative for headaches.   Exam: Physical Exam  Constitutional: She is oriented to person, place, and time.  HENT:  Nose: No mucosal edema.  Mouth/Throat: No oropharyngeal exudate or posterior oropharyngeal edema.  Eyes: Conjunctivae, EOM and lids are normal. Pupils are equal, round, and reactive to light.  Neck: No JVD present. Carotid bruit is not present. No edema present. No thyroid mass and no thyromegaly present.  Cardiovascular: S1 normal and S2 normal. Exam reveals no gallop.  No murmur heard. Pulses:      Dorsalis pedis pulses are 2+ on the right side, and 2+ on the left side.  Respiratory: No respiratory distress. She has decreased breath sounds in the right lower field and the left lower field. She has no wheezes. She has no rhonchi. She has no rales.  GI: Soft. Bowel sounds are normal. There is no tenderness.   Musculoskeletal:       Right ankle: She exhibits swelling.       Left ankle: She exhibits swelling.  Lymphadenopathy:    She has no cervical adenopathy.  Neurological: She is alert and oriented to person, place, and time. No cranial nerve deficit.  Skin: Skin is warm. No rash noted. Nails show no clubbing.  Psychiatric: She has a normal mood and affect.      Data Reviewed: Basic Metabolic Panel: Recent Labs  Lab 02/05/18 1124 02/06/18 0641  NA 132* 138  K 4.9 4.2  CL 101 108  CO2 21* 22  GLUCOSE 159* 101*  BUN 22* 20  CREATININE 0.66 0.74  CALCIUM 9.0 8.9   Liver Function Tests: Recent Labs  Lab 02/05/18 1124  AST 14*  ALT 12*  ALKPHOS 78  BILITOT 0.5  PROT 7.7  ALBUMIN 3.6  CBC: Recent Labs  Lab 02/05/18 1124 02/06/18 0641  WBC 6.5 6.2  HGB 12.8 11.5*  HCT 39.2 35.8  MCV 86.9 86.5  PLT 266 266   Cardiac Enzymes: Recent Labs  Lab 02/05/18 1124  TROPONINI <0.03    CBG: Recent Labs  Lab 02/05/18 1646 02/05/18 2056 02/06/18 0751 02/06/18 1143  GLUCAP 99 121* 102* 139*     Studies: Dg Chest Portable 1 View  Result Date: 02/05/2018 CLINICAL DATA:  Patient became dizzy and fell yesterday. EXAM: PORTABLE CHEST 1 VIEW COMPARISON:  08/12/2013. FINDINGS: The heart is enlarged. There is no consolidation or edema. No effusion  or pneumothorax. Skeletal osteopenia. Tortuous aorta. Similar appearance to priors. IMPRESSION: Cardiomegaly.  No active disease. Electronically Signed   By: Elsie Stain M.D.   On: 02/05/2018 13:19    Scheduled Meds: . [START ON 02/07/2018] amLODipine  2.5 mg Oral Daily  . cyanocobalamin  250 mcg Oral Daily  . docusate sodium  100 mg Oral BID  . enoxaparin (LOVENOX) injection  40 mg Subcutaneous Q24H  . fluticasone  1 spray Each Nare Daily  . insulin aspart  0-9 Units Subcutaneous TID WC  . losartan  50 mg Oral Daily  . metFORMIN  500 mg Oral BID WC  . ranitidine  300 mg Oral QHS   Continuous  Infusions:  Assessment/Plan:  1.  Accelerated hypertension and bradycardia.  Patient with dizziness.  Blood pressure better controlled today.  Stop hydralazine and use Norvasc instead.  Continue losartan.  Family states that she felt like she was going to pass out when on the commode.  Patient does not orthostatic blood pressure actually went up when she stood up.  Will have cardiology evaluate. 2.  Type 2 diabetes mellitus on metformin 3.  Weakness get physical therapy evaluation. 4.   B12 deficiency on vitamin B12  Code Status:     Code Status Orders  (From admission, onward)        Start     Ordered   02/05/18 1516  Full code  Continuous     02/05/18 1516    Code Status History    Date Active Date Inactive Code Status Order ID Comments User Context   08/19/2013 15:41 08/20/2013 21:52 Full Code [id number]  Pascal Lux, PA-C Inpatient     Family Communication: Family at the bedside Disposition Plan: Patient still feeling dizzy and woozy when standing up.  We will get physical therapy evaluation.   Consultants:  Cardiology  28 minutesTime spent:   [employer]

## 2018-02-06 NOTE — Care Management Obs Status (Signed)
MEDICARE OBSERVATION STATUS NOTIFICATION   Patient Details  Name: Madison LyonsDarie W Crichlow MRN: [id number] Date of Birth: [birth year]   Medicare Observation Status Notification Given:  Yes    Collie SiadAngela Timira Bieda, RN 02/06/2018, 10:14 AM

## 2018-02-07 ENCOUNTER — Observation Stay (HOSPITAL_BASED_OUTPATIENT_CLINIC_OR_DEPARTMENT_OTHER): Payer: Medicare Other

## 2018-02-07 ENCOUNTER — Observation Stay (INDEPENDENT_AMBULATORY_CARE_PROVIDER_SITE_OTHER): Payer: Medicare Other

## 2018-02-07 ENCOUNTER — Encounter: Payer: Self-pay | Admitting: Radiology

## 2018-02-07 ENCOUNTER — Other Ambulatory Visit: Payer: Self-pay | Admitting: *Deleted

## 2018-02-07 DIAGNOSIS — R55 Syncope and collapse: Secondary | ICD-10-CM | POA: Diagnosis not present

## 2018-02-07 DIAGNOSIS — R001 Bradycardia, unspecified: Secondary | ICD-10-CM

## 2018-02-07 DIAGNOSIS — R42 Dizziness and giddiness: Secondary | ICD-10-CM

## 2018-02-07 DIAGNOSIS — I351 Nonrheumatic aortic (valve) insufficiency: Secondary | ICD-10-CM

## 2018-02-07 DIAGNOSIS — I10 Essential (primary) hypertension: Secondary | ICD-10-CM | POA: Diagnosis not present

## 2018-02-07 DIAGNOSIS — E538 Deficiency of other specified B group vitamins: Secondary | ICD-10-CM | POA: Diagnosis not present

## 2018-02-07 DIAGNOSIS — E119 Type 2 diabetes mellitus without complications: Secondary | ICD-10-CM | POA: Diagnosis not present

## 2018-02-07 DIAGNOSIS — I5032 Chronic diastolic (congestive) heart failure: Secondary | ICD-10-CM | POA: Diagnosis not present

## 2018-02-07 DIAGNOSIS — M4710 Other spondylosis with myelopathy, site unspecified: Secondary | ICD-10-CM | POA: Diagnosis not present

## 2018-02-07 DIAGNOSIS — R531 Weakness: Secondary | ICD-10-CM | POA: Diagnosis not present

## 2018-02-07 LAB — NM MYOCAR MULTI W/SPECT W/WALL MOTION / EF
LV dias vol: 85 mL (ref 46–106)
LV sys vol: 29 mL
Peak HR: 92 {beats}/min
Rest HR: 52 {beats}/min
TID: 0.87

## 2018-02-07 LAB — GLUCOSE, CAPILLARY
GLUCOSE-CAPILLARY: 108 mg/dL — AB (ref 65–99)
GLUCOSE-CAPILLARY: 111 mg/dL — AB (ref 65–99)

## 2018-02-07 MED ORDER — AMLODIPINE BESYLATE 2.5 MG PO TABS: 2.5000 mg | ORAL_TABLET | Freq: Every day | ORAL | 0 refills | Status: DC

## 2018-02-07 MED ORDER — LISINOPRIL 10 MG PO TABS: 10.0000 mg | ORAL_TABLET | Freq: Every day | ORAL | Status: DC

## 2018-02-07 MED ORDER — TECHNETIUM TC 99M TETROFOSMIN IV KIT
32.1000 | PACK | Freq: Once | INTRAVENOUS | Status: AC | PRN
  Administered 2018-02-07: 32.1 via INTRAVENOUS

## 2018-02-07 MED ORDER — TECHNETIUM TC 99M TETROFOSMIN IV KIT
12.2680 | PACK | Freq: Once | INTRAVENOUS | Status: AC | PRN
  Administered 2018-02-07: 12.268 via INTRAVENOUS

## 2018-02-07 MED ORDER — REGADENOSON 0.4 MG/5ML IV SOLN
0.4000 mg | Freq: Once | INTRAVENOUS | Status: AC
  Administered 2018-02-07: 0.4 mg via INTRAVENOUS

## 2018-02-07 MED ORDER — LISINOPRIL 5 MG PO TABS: 5.0000 mg | ORAL_TABLET | Freq: Every day | ORAL | 0 refills | Status: DC

## 2018-02-07 MED ORDER — LISINOPRIL 5 MG PO TABS
5.0000 mg | ORAL_TABLET | Freq: Every day | ORAL | Status: DC
  Administered 2018-02-07: 5 mg via ORAL
  Filled 2018-02-07: qty 1

## 2018-02-07 NOTE — Discharge Summary (Signed)
[employer] - [place] [facility]   PATIENT NAME: [name]    MR#:  [id number]  DATE OF BIRTH:  [birth year]  DATE OF ADMISSION:  02/05/2018 ADMITTING PHYSICIAN: Katha Hamming, MD  DATE OF DISCHARGE: 02/06/2018  PRIMARY CARE PHYSICIAN: Duanne Limerick, MD    ADMISSION DIAGNOSIS:  Bradycardia [R00.1] Syncope, unspecified syncope type [R55] Hypertension, unspecified type [I10]  DISCHARGE DIAGNOSIS:  Accelerated hypertension  SECONDARY DIAGNOSIS:   Past Medical History:  Diagnosis Date  . GERD (gastroesophageal reflux disease)   . Hypertension   . Right knee DJD   . Type II diabetes mellitus (HCC)     HOSPITAL COURSE:   1.  Accelerated hypertension and bradycardia.  The patient had dizziness.  The patient's losartan was recalled.  Patient initially was placed on hydralazine which I changed over to Norvasc instead.  We change the patient's losartan over the low-dose lisinopril.  Patient was feeling better today.  The patient was not orthostatic.  Cardiology did a stress test which was a low risk scan.  They recommended a Holter monitor through their office in follow-up after that was complete. 2.  Type 2 diabetes mellitus on metformin 3.  Vitamin B12 deficiency on B12 supplementation 4.  Weakness.  Physical therapy recommended home health.   DISCHARGE CONDITIONS:   Satisfactory  CONSULTS OBTAINED:  Treatment Team:  Yvonne Kendall, MD  DRUG ALLERGIES:   Allergies  Allergen Reactions  . Nsaids     DISCHARGE MEDICATIONS:   Allergies as of 02/07/2018      Reactions   Nsaids       Medication List    STOP taking these medications   losartan 50 MG tablet Commonly known as:  COZAAR     TAKE these medications   acetaminophen 325 MG tablet Commonly known as:  TYLENOL Take 2 tablets (650 mg total) by mouth every 6 (six) hours as needed.   amLODipine 2.5 MG tablet Commonly known as:  NORVASC Take 1 tablet (2.5 mg total) by mouth  daily. Start taking on:  02/08/2018   aspirin 81 MG tablet Take 81 mg by mouth daily.   cyanocobalamin 100 MCG tablet Take 1 tablet by mouth daily.   diclofenac sodium 1 % Gel Commonly known as:  VOLTAREN Apply 4 g topically 4 (four) times daily as needed.   glucose blood test strip Commonly known as:  ONE TOUCH ULTRA TEST USE ONE STRIP TO CHECK GLUCOSE ONCE DAILY   lisinopril 5 MG tablet Commonly known as:  PRINIVIL,ZESTRIL Take 1 tablet (5 mg total) by mouth daily. Start taking on:  02/08/2018   metFORMIN 500 MG tablet Commonly known as:  GLUCOPHAGE Take 1 tablet (500 mg total) by mouth 2 (two) times daily with a meal.   mometasone 50 MCG/ACT nasal spray Commonly known as:  NASONEX Place 2 sprays into the nose daily.   ranitidine 300 MG tablet Commonly known as:  ZANTAC Take 1 tablet (300 mg total) by mouth at bedtime.            Durable Medical Equipment  (From admission, onward)        Start     Ordered   02/07/18 0845  For home use only DME 4 wheeled rolling walker with seat  Once    Question:  Patient needs a walker to treat with the following condition  Answer:  Unsteady gait   02/07/18 0845   02/06/18 1014  For home use only DME 4 wheeled rolling  walker with seat  Once    Question:  Patient needs a walker to treat with the following condition  Answer:  Difficulty walking   02/06/18 1013       DISCHARGE INSTRUCTIONS:  Follow-up PMD  6 days Follow-up with Dr. Okey Dupre cardiology in 2-3 weeks  If you experience worsening of your admission symptoms, develop shortness of breath, life threatening emergency, suicidal or homicidal thoughts you must seek medical attention immediately by calling 911 or calling your MD immediately  if symptoms less severe.  You Must read complete instructions/literature along with all the possible adverse reactions/side effects for all the Medicines you take and that have been prescribed to you. Take any new Medicines after you have  completely understood and accept all the possible adverse reactions/side effects.   Please note  You were cared for by a hospitalist during your hospital stay. If you have any questions about your discharge medications or the care you received while you were in the hospital after you are discharged, you can call the unit and asked to speak with the hospitalist on call if the hospitalist that took care of you is not available. Once you are discharged, your primary care physician will handle any further medical issues. Please note that NO REFILLS for any discharge medications will be authorized once you are discharged, as it is imperative that you return to your primary care physician (or establish a relationship with a primary care physician if you do not have one) for your aftercare needs so that they can reassess your need for medications and monitor your lab values.    Today   CHIEF COMPLAINT:   Chief Complaint  Patient presents with  . Dizziness  . Fall    HISTORY OF PRESENT ILLNESS:  [name]  is a 84 y.o. female came in with dizziness and hypertension   VITAL SIGNS:  Blood pressure 138/66, pulse (!) 55, temperature 97.8 F (36.6 C), temperature source Oral, resp. rate 18, height 5\' 5"  (1.651 m), weight 84.4 kg (186 lb 1.6 oz), SpO2 100 %.   PHYSICAL EXAMINATION:  GENERAL:  84 y.o.-year-old patient lying in the bed with no acute distress.  EYES: Pupils equal, round, reactive to light and accommodation. No scleral icterus. Extraocular muscles intact.  HEENT: Head atraumatic, normocephalic. Oropharynx and nasopharynx clear.  NECK:  Supple, no jugular venous distention. No thyroid enlargement, no tenderness.  LUNGS:  decreased breath sounds bilaterally, no wheezing, rales,rhonchi or crepitation. No use of accessory muscles of respiration.  CARDIOVASCULAR: S1, S2 normal. No murmurs, rubs, or gallops.  ABDOMEN: Soft, non-tender, non-distended. Bowel sounds present. No  organomegaly or mass.  EXTREMITIES: 2+ edema, no cyanosis, or clubbing.  NEUROLOGIC: Cranial nerves II through XII are intact. Muscle strength 5/5 in all extremities. Sensation intact. Gait not checked.  PSYCHIATRIC: The patient is alert and oriented x 3.  SKIN: No obvious rash, lesion, or ulcer.   DATA REVIEW:   CBC Recent Labs  Lab 02/06/18 0641  WBC 6.2  HGB 11.5*  HCT 35.8  PLT 266    Chemistries  Recent Labs  Lab 02/05/18 1124 02/06/18 0641  NA 132* 138  K 4.9 4.2  CL 101 108  CO2 21* 22  GLUCOSE 159* 101*  BUN 22* 20  CREATININE 0.66 0.74  CALCIUM 9.0 8.9  AST 14*  --   ALT 12*  --   ALKPHOS 78  --   BILITOT 0.5  --     Cardiac Enzymes Recent  Labs  Lab 02/06/18 1546  TROPONINI <0.03     RADIOLOGY:  Nm Myocar Multi W/spect W/wall Motion / Ef  Result Date: 02/07/2018  Normal pharmacologic myocardial perfusion stress test without ischemia or scar.  Normal left ventricular systolic function (LVEF 54%).  This is a low risk study.  Baseline hypertension and sinus bradycardia. Appropriate heart rate response noted with regadenoson.      Management plans discussed with the patient, family and they are in agreement.  CODE STATUS:     Code Status Orders  (From admission, onward)        Start     Ordered   02/05/18 1516  Full code  Continuous     02/05/18 1516    Code Status History    Date Active Date Inactive Code Status Order ID Comments User Context   08/19/2013 15:41 08/20/2013 21:52 Full Code [id number]  Shepperson, Clayborne ArtistKirstin J, PA-C Inpatient      TOTAL TIME TAKING CARE OF THIS PATIENT: 35 minutes.    Alford Highlandichard Lakeithia Rasor M.D on 02/07/2018 at 3:04 PM  Between 7am to 6pm - Pager - [phone]  After 6pm go to [email or web address] - password [email or web address]  [employer] Office  [phone]  CC: Primary care physician; Duanne LimerickJones, Deanna C, MD

## 2018-02-07 NOTE — Progress Notes (Signed)
IV and tele removed from patient. Discharge instructions given to patient along with hard copy prescriptions. [facility]. Daughter at bedside with patient, no distress at this time. Awaiting transportation for discharge.

## 2018-02-07 NOTE — Care Management (Signed)
Patient for discharge today and home physical therapy is recommended.  Patient is requesting a rollator walker but physical therapy says she is not functional ready for a rollator but patient is very insistent in having one.  Discussed that medicare may not pay for it. Family verbalizes understanding that patient is not to use it until the home health therapist says she is functionally ready.  No agency preference - Referral for DME and physical therapy called to and accepted by [facility].  Patient will be discharging to her daughter's home: Avel Sensoreresa Harvey- [address], [place] [phone].  [facility] has this address

## 2018-02-07 NOTE — Progress Notes (Signed)
Progress Note  Patient Name: Madison LyonsDarie W Janusz Date of Encounter: 02/07/2018  Primary Cardiologist: New - Anjani Feuerborn  Subjective   Feels well this morning. Mild headache but improved lightheadedness.  Inpatient Medications    Scheduled Meds: . amLODipine  2.5 mg Oral Daily  . cyanocobalamin  250 mcg Oral Daily  . docusate sodium  100 mg Oral BID  . enoxaparin (LOVENOX) injection  40 mg Subcutaneous Q24H  . fluticasone  1 spray Each Nare Daily  . insulin aspart  0-9 Units Subcutaneous TID WC  . lisinopril  5 mg Oral Daily  . metFORMIN  500 mg Oral BID WC  . ranitidine  300 mg Oral QHS   Continuous Infusions:  PRN Meds: acetaminophen **OR** acetaminophen, bisacodyl, ondansetron **OR** ondansetron (ZOFRAN) IV, traZODone   Vital Signs    Vitals:   02/06/18 1918 02/07/18 0335 02/07/18 0820 02/07/18 1220  BP: (!) 154/56 (!) 119/50 (!) 131/57 138/66  Pulse: (!) 55 (!) 55 (!) 52 (!) 55  Resp: 17 18 18    Temp: 97.6 F (36.4 C) 98.3 F (36.8 C) 97.8 F (36.6 C)   TempSrc: Oral  Oral   SpO2: 99% 100% 100%   Weight:  186 lb 1.6 oz (84.4 kg)    Height:        Intake/Output Summary (Last 24 hours) at 02/07/2018 1327 Last data filed at 02/07/2018 0920 Gross per 24 hour  Intake -  Output 100 ml  Net -100 ml   Filed Weights   02/05/18 1120 02/06/18 0325 02/07/18 0335  Weight: 192 lb (87.1 kg) 191 lb (86.6 kg) 186 lb 1.6 oz (84.4 kg)    Telemetry    NSR and sinus bradycardia - Personally Reviewed  ECG    No new tracing  Physical Exam   GEN: No acute distress.   Neck: No JVD Cardiac: Bradycardic but regular without murmurs. Respiratory: Clear to auscultation bilaterally. GI: Soft, nontender, non-distended  MS: No edema; No deformity. Neuro:  Nonfocal  Psych: Normal affect   Labs    Chemistry Recent Labs  Lab 02/05/18 1124 02/06/18 0641  NA 132* 138  K 4.9 4.2  CL 101 108  CO2 21* 22  GLUCOSE 159* 101*  BUN 22* 20  CREATININE 0.66 0.74  CALCIUM 9.0 8.9    PROT 7.7  --   ALBUMIN 3.6  --   AST 14*  --   ALT 12*  --   ALKPHOS 78  --   BILITOT 0.5  --   GFRNONAA >60 >60  GFRAA >60 >60  ANIONGAP 10 8     Hematology Recent Labs  Lab 02/05/18 1124 02/06/18 0641  WBC 6.5 6.2  RBC 4.50 4.14  HGB 12.8 11.5*  HCT 39.2 35.8  MCV 86.9 86.5  MCH 28.4 27.9  MCHC 32.7 32.2  RDW 14.1 14.0  PLT 266 266    Cardiac Enzymes Recent Labs  Lab 02/05/18 1124 02/06/18 1546  TROPONINI <0.03 <0.03   No results for input(s): TROPIPOC in the last 168 hours.   BNPNo results for input(s): BNP, PROBNP in the last 168 hours.   DDimer No results for input(s): DDIMER in the last 168 hours.   Radiology    Nm Myocar Multi W/spect W/wall Motion / Ef  Result Date: 02/07/2018  Normal pharmacologic myocardial perfusion stress test without ischemia or scar.  Normal left ventricular systolic function (LVEF 54%).  This is a low risk study.  Baseline hypertension and sinus bradycardia. Appropriate heart rate  response noted with regadenoson.     Cardiac Studies   Pharmacologic myocardial perfusion stress test (02/07/18): Low risk study without ischemia or scar. LVEF 54%.  Patient Profile     84 y.o. female with history of DM2 and HTN, admitted with lightheadedness and questionable syncope in the setting of sinus bradycardia and LBBB.  Assessment & Plan    Sinus bradycardia Still present but appropriate HR response noted with activity and regadenoson administration. TSH normal; FT4 slightly elevated.  Avoid beta blockers and calcium channel blockers.  14-day event monitor at discharge.  Lightheadedness and possible syncope Lightheadedness improving. I suspect episode was unrelated to sinus bradycardia, as prior EKGs back to 2014 have shown this. Stress test today was low risk without significant perfusion abnormality.  No further inpatient testing.  Encourage adequate hydration.  Recommend 14-day event monitor upon  discharge.  Hypertension BP labile and seems to have been exacerbated by recent discontinuation of losartan due to medication recall.  Recommend starting lisinopril 5 mg daily; further uptitration based on BP response.  Aortic regurgitation No evidence of heart failure. Echo suggests that AI is not severe (though windows are suboptimal).  Patient will benefit from good blood pressure control.  Disposition: Patient is appropriate for discharge from cardiology standpoint if she is able to ambulate without difficulty. She should follow-up in our office shortly after 14-day event monitor has been completed.  For questions or updates, please contact [facility] Please consult [email or web address] for contact info under [facility].     Signed, Yvonne Kendall, MD  02/07/2018, 1:27 PM

## 2018-02-08 ENCOUNTER — Telehealth: Payer: Self-pay

## 2018-02-08 NOTE — Telephone Encounter (Signed)
TOC #1. Called pt to f/u after [facility] on [facility]/07/18. Discharge planning includes the following:  - Holter monitoring with cardiologist after d/c - D/c home with HH and Rolling walker w/ seat - Start Amlodipine and Lisinopril - Stop Losartan  - follow DASH diet - f/u with Dr. Okey DupreEnd within 3 weeks after d/c - f/u with Dr. Yetta BarreJones with 6 days after d/c. NOTE - at the time of this entry, pt has not yet scheduled this appt.  As part of the TOC f/u call, I am also wanting to discuss/review the above information with the pt to ensure all of the above have been taken care of, as well as to schedule hosp f/u appt with Dr. Yetta BarreJones. LVM requesting returned call.

## 2018-02-09 NOTE — Telephone Encounter (Signed)
Call [name]/ daughter @ [phone]. Tell Madison Gesseressa that Dr Yetta BarreJones wants her mom to have this appt

## 2018-02-12 DIAGNOSIS — I2510 Atherosclerotic heart disease of native coronary artery without angina pectoris: Secondary | ICD-10-CM | POA: Diagnosis not present

## 2018-02-12 DIAGNOSIS — Z7984 Long term (current) use of oral hypoglycemic drugs: Secondary | ICD-10-CM | POA: Diagnosis not present

## 2018-02-12 DIAGNOSIS — E119 Type 2 diabetes mellitus without complications: Secondary | ICD-10-CM | POA: Diagnosis not present

## 2018-02-12 DIAGNOSIS — R42 Dizziness and giddiness: Secondary | ICD-10-CM | POA: Diagnosis not present

## 2018-02-12 DIAGNOSIS — M1711 Unilateral primary osteoarthritis, right knee: Secondary | ICD-10-CM | POA: Diagnosis not present

## 2018-02-12 DIAGNOSIS — I10 Essential (primary) hypertension: Secondary | ICD-10-CM | POA: Diagnosis not present

## 2018-02-12 DIAGNOSIS — R531 Weakness: Secondary | ICD-10-CM | POA: Diagnosis not present

## 2018-02-12 DIAGNOSIS — K219 Gastro-esophageal reflux disease without esophagitis: Secondary | ICD-10-CM | POA: Diagnosis not present

## 2018-02-12 DIAGNOSIS — R55 Syncope and collapse: Secondary | ICD-10-CM | POA: Diagnosis not present

## 2018-02-15 ENCOUNTER — Telehealth: Payer: Self-pay | Admitting: Family Medicine

## 2018-02-15 DIAGNOSIS — Z7984 Long term (current) use of oral hypoglycemic drugs: Secondary | ICD-10-CM | POA: Diagnosis not present

## 2018-02-15 DIAGNOSIS — K219 Gastro-esophageal reflux disease without esophagitis: Secondary | ICD-10-CM | POA: Diagnosis not present

## 2018-02-15 DIAGNOSIS — R42 Dizziness and giddiness: Secondary | ICD-10-CM | POA: Diagnosis not present

## 2018-02-15 DIAGNOSIS — E119 Type 2 diabetes mellitus without complications: Secondary | ICD-10-CM | POA: Diagnosis not present

## 2018-02-15 DIAGNOSIS — R531 Weakness: Secondary | ICD-10-CM | POA: Diagnosis not present

## 2018-02-15 DIAGNOSIS — I10 Essential (primary) hypertension: Secondary | ICD-10-CM | POA: Diagnosis not present

## 2018-02-15 DIAGNOSIS — R55 Syncope and collapse: Secondary | ICD-10-CM | POA: Diagnosis not present

## 2018-02-15 DIAGNOSIS — M1711 Unilateral primary osteoarthritis, right knee: Secondary | ICD-10-CM | POA: Diagnosis not present

## 2018-02-15 DIAGNOSIS — I2510 Atherosclerotic heart disease of native coronary artery without angina pectoris: Secondary | ICD-10-CM | POA: Diagnosis not present

## 2018-02-15 NOTE — Telephone Encounter (Signed)
Patient called in to schedule an office visit from a hospital f/u on April 5 at 9:30.

## 2018-02-19 DIAGNOSIS — M1711 Unilateral primary osteoarthritis, right knee: Secondary | ICD-10-CM | POA: Diagnosis not present

## 2018-02-19 DIAGNOSIS — E119 Type 2 diabetes mellitus without complications: Secondary | ICD-10-CM | POA: Diagnosis not present

## 2018-02-19 DIAGNOSIS — Z7984 Long term (current) use of oral hypoglycemic drugs: Secondary | ICD-10-CM | POA: Diagnosis not present

## 2018-02-19 DIAGNOSIS — R531 Weakness: Secondary | ICD-10-CM | POA: Diagnosis not present

## 2018-02-19 DIAGNOSIS — R42 Dizziness and giddiness: Secondary | ICD-10-CM | POA: Diagnosis not present

## 2018-02-19 DIAGNOSIS — I10 Essential (primary) hypertension: Secondary | ICD-10-CM | POA: Diagnosis not present

## 2018-02-19 DIAGNOSIS — R55 Syncope and collapse: Secondary | ICD-10-CM | POA: Diagnosis not present

## 2018-02-19 DIAGNOSIS — K219 Gastro-esophageal reflux disease without esophagitis: Secondary | ICD-10-CM | POA: Diagnosis not present

## 2018-02-19 DIAGNOSIS — I2510 Atherosclerotic heart disease of native coronary artery without angina pectoris: Secondary | ICD-10-CM | POA: Diagnosis not present

## 2018-02-22 DIAGNOSIS — I2510 Atherosclerotic heart disease of native coronary artery without angina pectoris: Secondary | ICD-10-CM | POA: Diagnosis not present

## 2018-02-22 DIAGNOSIS — M1711 Unilateral primary osteoarthritis, right knee: Secondary | ICD-10-CM | POA: Diagnosis not present

## 2018-02-22 DIAGNOSIS — R531 Weakness: Secondary | ICD-10-CM | POA: Diagnosis not present

## 2018-02-22 DIAGNOSIS — R55 Syncope and collapse: Secondary | ICD-10-CM | POA: Diagnosis not present

## 2018-02-22 DIAGNOSIS — E119 Type 2 diabetes mellitus without complications: Secondary | ICD-10-CM | POA: Diagnosis not present

## 2018-02-22 DIAGNOSIS — I10 Essential (primary) hypertension: Secondary | ICD-10-CM | POA: Diagnosis not present

## 2018-02-22 DIAGNOSIS — R42 Dizziness and giddiness: Secondary | ICD-10-CM | POA: Diagnosis not present

## 2018-02-22 DIAGNOSIS — K219 Gastro-esophageal reflux disease without esophagitis: Secondary | ICD-10-CM | POA: Diagnosis not present

## 2018-02-22 DIAGNOSIS — Z7984 Long term (current) use of oral hypoglycemic drugs: Secondary | ICD-10-CM | POA: Diagnosis not present

## 2018-02-23 ENCOUNTER — Encounter: Payer: Self-pay | Admitting: Family Medicine

## 2018-02-23 ENCOUNTER — Ambulatory Visit (INDEPENDENT_AMBULATORY_CARE_PROVIDER_SITE_OTHER): Payer: Medicare Other | Admitting: Family Medicine

## 2018-02-23 VITALS — BP 130/80 | HR 80 | Ht 62.0 in | Wt 190.0 lb

## 2018-02-23 DIAGNOSIS — I10 Essential (primary) hypertension: Secondary | ICD-10-CM | POA: Diagnosis not present

## 2018-02-23 MED ORDER — AMLODIPINE BESYLATE 2.5 MG PO TABS: 2.5000 mg | ORAL_TABLET | Freq: Every day | ORAL | 6 refills | Status: DC

## 2018-02-23 MED ORDER — LISINOPRIL 5 MG PO TABS: 5.0000 mg | ORAL_TABLET | Freq: Every day | ORAL | 6 refills | Status: DC

## 2018-02-23 NOTE — Progress Notes (Signed)
Name: [name]   MRN: [id number]    DOB: [birth year]   Date:02/23/2018       Progress Note  Subjective  Chief Complaint  Chief Complaint  Patient presents with  . Follow-up    hospital d/c on March 20,19. Had stopped Losartan per pharm and b/p was elevated but heart rate was low    Hypertension  This is a chronic problem. The current episode started more than 1 year ago. The problem has been gradually improving since onset. The problem is controlled. Pertinent negatives include no anxiety, blurred vision, chest pain, headaches, malaise/fatigue, neck pain, orthopnea, palpitations, peripheral edema, PND, shortness of breath or sweats. There are no associated agents to hypertension. There are no known risk factors for coronary artery disease. Past treatments include ACE inhibitors and calcium channel blockers. There are no compliance problems.  There is no history of angina, kidney disease, CAD/MI, CVA, heart failure, left ventricular hypertrophy, PVD or retinopathy. There is no history of chronic renal disease, a hypertension causing med or renovascular disease.  Knee Pain   The incident occurred more than 1 week ago. There was no injury mechanism. The pain is present in the left knee. The quality of the pain is described as aching. The pain is at a severity of 4/10. The pain is moderate. The pain has been constant since onset. Pertinent negatives include no inability to bear weight, loss of motion, loss of sensation, muscle weakness, numbness or tingling. The symptoms are aggravated by movement. Treatments tried: voltaren gel. The treatment provided moderate relief.    No problem-specific Assessment & Plan notes found for this encounter.   Past Medical History:  Diagnosis Date  . GERD (gastroesophageal reflux disease)   . Hypertension   . Right knee DJD   . Type II diabetes mellitus (HCC)     Past Surgical History:  Procedure Laterality Date  . BACK SURGERY    . CATARACT EXTRACTION  Bilateral 03/2016  . CERVICAL DISC SURGERY  12/28/2010   "bulging disc; went in on the left side of my neck" (08/19/2013)  . JOINT REPLACEMENT    . TOTAL KNEE ARTHROPLASTY Left 03/28/2011   Dr Thurston HoleWainer  . TOTAL KNEE ARTHROPLASTY Right 08/19/2013  . TOTAL KNEE ARTHROPLASTY Right 08/19/2013   Procedure: TOTAL KNEE ARTHROPLASTY;  Surgeon: Nilda Simmerobert A Wainer, MD;  Location: [facility] OR;  Service: Orthopedics;  Laterality: Right;    Family History  Problem Relation Age of Onset  . Diabetes Mother   . Cerebral aneurysm Father     Social History   Socioeconomic History  . Marital status: Widowed    Spouse name: Not on file  . Number of children: Not on file  . Years of education: Not on file  . Highest education level: Not on file  Occupational History  . Not on file  Social Needs  . Financial resource strain: Not on file  . Food insecurity:    Worry: Not on file    Inability: Not on file  . Transportation needs:    Medical: Not on file    Non-medical: Not on file  Tobacco Use  . Smoking status: Never Smoker  . Smokeless tobacco: Never Used  Substance and Sexual Activity  . Alcohol use: No  . Drug use: No  . Sexual activity: Never  Lifestyle  . Physical activity:    Days per week: Not on file    Minutes per session: Not on file  . Stress: Not on file  Relationships  . Social connections:    Talks on phone: Not on file    Gets together: Not on file    Attends religious service: Not on file    Active member of club or organization: Not on file    Attends meetings of clubs or organizations: Not on file    Relationship status: Not on file  . Intimate partner violence:    Fear of current or ex partner: Not on file    Emotionally abused: Not on file    Physically abused: Not on file    Forced sexual activity: Not on file  Other Topics Concern  . Not on file  Social History Narrative  . Not on file    Allergies  Allergen Reactions  . Nsaids     Outpatient Medications Prior to  Visit  Medication Sig Dispense Refill  . acetaminophen (TYLENOL) 325 MG tablet Take 2 tablets (650 mg total) by mouth every 6 (six) hours as needed.    Marland Kitchen aspirin 81 MG tablet Take 81 mg by mouth daily.    . cyanocobalamin 100 MCG tablet Take 1 tablet by mouth daily.     . diclofenac sodium (VOLTAREN) 1 % GEL Apply 4 g topically 4 (four) times daily as needed.    Marland Kitchen glucose blood (ONE TOUCH ULTRA TEST) test strip USE ONE STRIP TO CHECK GLUCOSE ONCE DAILY 50 each 1  . metFORMIN (GLUCOPHAGE) 500 MG tablet Take 1 tablet (500 mg total) by mouth 2 (two) times daily with a meal. 60 tablet 5  . mometasone (NASONEX) 50 MCG/ACT nasal spray Place 2 sprays into the nose daily. 17 g 12  . ranitidine (ZANTAC) 300 MG tablet Take 1 tablet (300 mg total) by mouth at bedtime. 30 tablet 5  . amLODipine (NORVASC) 2.5 MG tablet Take 1 tablet (2.5 mg total) by mouth daily. 30 tablet 0  . lisinopril (PRINIVIL,ZESTRIL) 5 MG tablet Take 1 tablet (5 mg total) by mouth daily. 30 tablet 0   Facility-Administered Medications Prior to Visit  Medication Dose Route Frequency Provider Last Rate Last Dose  . carbamide peroxide (DEBROX) 6.5 % otic solution 5 drop  5 drop Both EARS Once Duanne Limerick, MD        Review of Systems  Constitutional: Negative for chills, fever, malaise/fatigue and weight loss.  HENT: Negative for ear discharge, ear pain and sore throat.   Eyes: Negative for blurred vision.  Respiratory: Negative for cough, sputum production, shortness of breath and wheezing.   Cardiovascular: Negative for chest pain, palpitations, orthopnea, leg swelling and PND.  Gastrointestinal: Negative for abdominal pain, blood in stool, constipation, diarrhea, heartburn, melena and nausea.  Genitourinary: Negative for dysuria, frequency, hematuria and urgency.  Musculoskeletal: Negative for back pain, joint pain, myalgias and neck pain.  Skin: Negative for rash.  Neurological: Negative for dizziness, tingling, sensory  change, focal weakness, numbness and headaches.  Endo/Heme/Allergies: Negative for environmental allergies and polydipsia. Does not bruise/bleed easily.  Psychiatric/Behavioral: Negative for depression and suicidal ideas. The patient is not nervous/anxious and does not have insomnia.      Objective  Vitals:   02/23/18 0945  BP: 130/80  Pulse: 80  Weight: 190 lb (86.2 kg)  Height: 5\' 2"  (1.575 m)    Physical Exam  Constitutional: She is well-developed, well-nourished, and in no distress. No distress.  HENT:  Head: Normocephalic and atraumatic.  Right Ear: External ear normal.  Left Ear: External ear normal.  Nose: Nose normal.  Mouth/Throat:  Oropharynx is clear and moist.  Eyes: Pupils are equal, round, and reactive to light. Conjunctivae and EOM are normal. Right eye exhibits no discharge. Left eye exhibits no discharge.  Neck: Normal range of motion. Neck supple. No JVD present. No thyromegaly present.  Cardiovascular: Normal rate, regular rhythm, normal heart sounds and intact distal pulses. Exam reveals no gallop and no friction rub.  No murmur heard. Pulmonary/Chest: Effort normal and breath sounds normal. She has no wheezes. She has no rales.  Abdominal: Soft. Bowel sounds are normal. She exhibits no mass. There is no tenderness. There is no guarding.  Musculoskeletal: Normal range of motion. She exhibits no edema.  Lymphadenopathy:    She has no cervical adenopathy.  Neurological: She is alert. She has normal reflexes.  Skin: Skin is warm and dry. She is not diaphoretic.  Psychiatric: Mood and affect normal.  Nursing note and vitals reviewed.     Assessment & Plan  Problem List Items Addressed This Visit      Cardiovascular and Mediastinum   Essential hypertension - Primary   Relevant Medications   amLODipine (NORVASC) 2.5 MG tablet   lisinopril (PRINIVIL,ZESTRIL) 5 MG tablet      Meds ordered this encounter  Medications  . amLODipine (NORVASC) 2.5 MG  tablet    Sig: Take 1 tablet (2.5 mg total) by mouth daily.    Dispense:  30 tablet    Refill:  6  . lisinopril (PRINIVIL,ZESTRIL) 5 MG tablet    Sig: Take 1 tablet (5 mg total) by mouth daily.    Dispense:  30 tablet    Refill:  6      Dr. Elizabeth Sauer [facility]  02/23/18

## 2018-02-26 DIAGNOSIS — I10 Essential (primary) hypertension: Secondary | ICD-10-CM | POA: Diagnosis not present

## 2018-02-26 DIAGNOSIS — E119 Type 2 diabetes mellitus without complications: Secondary | ICD-10-CM | POA: Diagnosis not present

## 2018-02-26 DIAGNOSIS — I2510 Atherosclerotic heart disease of native coronary artery without angina pectoris: Secondary | ICD-10-CM | POA: Diagnosis not present

## 2018-02-26 DIAGNOSIS — M1711 Unilateral primary osteoarthritis, right knee: Secondary | ICD-10-CM | POA: Diagnosis not present

## 2018-02-26 DIAGNOSIS — Z7984 Long term (current) use of oral hypoglycemic drugs: Secondary | ICD-10-CM | POA: Diagnosis not present

## 2018-02-26 DIAGNOSIS — R42 Dizziness and giddiness: Secondary | ICD-10-CM | POA: Diagnosis not present

## 2018-02-26 DIAGNOSIS — R531 Weakness: Secondary | ICD-10-CM | POA: Diagnosis not present

## 2018-02-26 DIAGNOSIS — K219 Gastro-esophageal reflux disease without esophagitis: Secondary | ICD-10-CM | POA: Diagnosis not present

## 2018-02-26 DIAGNOSIS — R55 Syncope and collapse: Secondary | ICD-10-CM | POA: Diagnosis not present

## 2018-02-28 DIAGNOSIS — R55 Syncope and collapse: Secondary | ICD-10-CM | POA: Diagnosis not present

## 2018-03-01 DIAGNOSIS — R531 Weakness: Secondary | ICD-10-CM | POA: Diagnosis not present

## 2018-03-01 DIAGNOSIS — M1711 Unilateral primary osteoarthritis, right knee: Secondary | ICD-10-CM | POA: Diagnosis not present

## 2018-03-01 DIAGNOSIS — R42 Dizziness and giddiness: Secondary | ICD-10-CM | POA: Diagnosis not present

## 2018-03-01 DIAGNOSIS — Z7984 Long term (current) use of oral hypoglycemic drugs: Secondary | ICD-10-CM | POA: Diagnosis not present

## 2018-03-01 DIAGNOSIS — R55 Syncope and collapse: Secondary | ICD-10-CM | POA: Diagnosis not present

## 2018-03-01 DIAGNOSIS — I10 Essential (primary) hypertension: Secondary | ICD-10-CM | POA: Diagnosis not present

## 2018-03-01 DIAGNOSIS — I2510 Atherosclerotic heart disease of native coronary artery without angina pectoris: Secondary | ICD-10-CM | POA: Diagnosis not present

## 2018-03-01 DIAGNOSIS — E119 Type 2 diabetes mellitus without complications: Secondary | ICD-10-CM | POA: Diagnosis not present

## 2018-03-01 DIAGNOSIS — K219 Gastro-esophageal reflux disease without esophagitis: Secondary | ICD-10-CM | POA: Diagnosis not present

## 2018-03-06 DIAGNOSIS — E119 Type 2 diabetes mellitus without complications: Secondary | ICD-10-CM | POA: Diagnosis not present

## 2018-03-06 DIAGNOSIS — Z7984 Long term (current) use of oral hypoglycemic drugs: Secondary | ICD-10-CM | POA: Diagnosis not present

## 2018-03-06 DIAGNOSIS — R531 Weakness: Secondary | ICD-10-CM | POA: Diagnosis not present

## 2018-03-06 DIAGNOSIS — K219 Gastro-esophageal reflux disease without esophagitis: Secondary | ICD-10-CM | POA: Diagnosis not present

## 2018-03-06 DIAGNOSIS — I2510 Atherosclerotic heart disease of native coronary artery without angina pectoris: Secondary | ICD-10-CM | POA: Diagnosis not present

## 2018-03-06 DIAGNOSIS — M1711 Unilateral primary osteoarthritis, right knee: Secondary | ICD-10-CM | POA: Diagnosis not present

## 2018-03-06 DIAGNOSIS — I10 Essential (primary) hypertension: Secondary | ICD-10-CM | POA: Diagnosis not present

## 2018-03-06 DIAGNOSIS — R55 Syncope and collapse: Secondary | ICD-10-CM | POA: Diagnosis not present

## 2018-03-06 DIAGNOSIS — R42 Dizziness and giddiness: Secondary | ICD-10-CM | POA: Diagnosis not present

## 2018-03-08 ENCOUNTER — Telehealth: Payer: Self-pay | Admitting: Internal Medicine

## 2018-03-08 NOTE — Telephone Encounter (Signed)
Results called to pt. Pt verbalized understanding. Patient scheduled to see Dr End on 03/14/18 at 2:20pm.

## 2018-03-08 NOTE — Telephone Encounter (Signed)
Pt returning our call  She would like to get her monitor results  Please call back

## 2018-03-14 ENCOUNTER — Encounter: Payer: Self-pay | Admitting: Internal Medicine

## 2018-03-14 ENCOUNTER — Ambulatory Visit (INDEPENDENT_AMBULATORY_CARE_PROVIDER_SITE_OTHER): Payer: Medicare Other | Admitting: Internal Medicine

## 2018-03-14 VITALS — BP 144/76 | HR 56 | Ht 65.0 in | Wt 194.2 lb

## 2018-03-14 DIAGNOSIS — I471 Supraventricular tachycardia: Secondary | ICD-10-CM | POA: Diagnosis not present

## 2018-03-14 DIAGNOSIS — I472 Ventricular tachycardia: Secondary | ICD-10-CM

## 2018-03-14 DIAGNOSIS — R001 Bradycardia, unspecified: Secondary | ICD-10-CM

## 2018-03-14 DIAGNOSIS — I10 Essential (primary) hypertension: Secondary | ICD-10-CM | POA: Diagnosis not present

## 2018-03-14 DIAGNOSIS — I4729 Other ventricular tachycardia: Secondary | ICD-10-CM

## 2018-03-14 MED ORDER — OLMESARTAN MEDOXOMIL 20 MG PO TABS: 20.0000 mg | ORAL_TABLET | Freq: Every day | ORAL | 2 refills | Status: DC

## 2018-03-14 NOTE — Patient Instructions (Signed)
Medication Instructions:  Your physician has recommended you make the following change in your medication:  1- STOP Lisinopril. 2- START Olmesartan 20mg   (1 tablet) by mouth once a day.   Labwork: Your physician recommends that you return for lab work in: 2 WEEKS FOR LAB WORK AND BLOOD PRESSURE CHECK.  BMET  Testing/Procedures: none  Follow-Up: Your physician recommends that you schedule a follow-up appointment in: 2 WEEKS FOR BLOOD PRESSURE CHECK AND LAB WORK.   Your physician recommends that you schedule a follow-up appointment in: 3 MONTHS WITH DR END.    If you need a refill on your cardiac medications before your next appointment, please call your pharmacy.

## 2018-03-14 NOTE — Progress Notes (Signed)
Follow-up Outpatient Visit Date: 03/14/2018  Primary Care Provider: Duanne Limerick, MD [address] [place] [address]  Chief Complaint: Cough  HPI:  [name] is a 84 y.o. year-old female with history of hypertension and type 2 diabetes mellitus, who presents for follow-up of bradycardia.  [name] was admitted last month at [facility] with dizziness following discontinuation of losartan due to medication recall.  She was noted to have significant sinus bradycardia in the setting of uncontrolled hypertension.  At the time of my evaluation, [name] was feeling better with only mild dizziness.  She was otherwise asymptomatic.  Subsequent event monitor showed predominantly sinus rhythm with an average rate of 66 bpm.  Occasional PVCs and rare PACs were noted.  Brief runs of SVT and NSVT were also observed.  Today, [name] complains predominantly of a nonproductive cough that began after she left the hospital.  Of note, she was started on lisinopril during the admission due to recall of losartan.  She has otherwise felt relatively well, denying chest pain, shortness of breath, palpitations, lightheadedness, and edema.  He has not had any falls.  She ambulates with a cane.  She has not checked her blood pressure since being discharged.  --------------------------------------------------------------------------------------------------  Cardiovascular History & Procedures: Cardiovascular Problems:  Bradycardia with paroxysmal SVT and NSVT  Risk Factors:  Hypertension, age greater than 18, and obesity  Cath/PCI:  None  CV Surgery:  None  EP Procedures and Devices:  14-day event monitor (02/07/2018): Predominantly sinus rhythm with occasional PVC's and rare PAC's.  Brief runs of SVT and NSVT noted.  Non-Invasive Evaluation(s):  Pharmacologic myocardial perfusion stress test (02/07/2018): Normal study without ischemia or scar.  LVEF 54%.  TTE (02/06/2018):  Technically difficult study.  Normal LV size.  LVEF 65 to 70% with grade 1 diastolic dysfunction and elevated filling pressure.  Mild to moderate aortic regurgitation.  Mild left atrial enlargement.  Normal RV size and function.  Mildly to moderately increased pulmonary artery pressure.  Recent CV Pertinent Labs: Lab Results  Component Value Date   CHOL 184 01/12/2018   HDL 79 01/12/2018   LDLCALC 97 01/12/2018   TRIG 41 01/12/2018   CHOLHDL 2.3 01/12/2018   INR 1.10 08/12/2013   K 4.2 02/06/2018   BUN 20 02/06/2018   BUN 17 01/12/2018   CREATININE 0.74 02/06/2018    Past medical and surgical history were reviewed and updated in EPIC.  Current Meds  Medication Sig  . acetaminophen (TYLENOL) 325 MG tablet Take 2 tablets (650 mg total) by mouth every 6 (six) hours as needed.  Marland Kitchen amLODipine (NORVASC) 2.5 MG tablet Take 1 tablet (2.5 mg total) by mouth daily.  Marland Kitchen aspirin 81 MG tablet Take 81 mg by mouth daily.  . cyanocobalamin 100 MCG tablet Take 1 tablet by mouth daily.   . diclofenac sodium (VOLTAREN) 1 % GEL Apply 4 g topically 4 (four) times daily as needed.  Marland Kitchen glucose blood (ONE TOUCH ULTRA TEST) test strip USE ONE STRIP TO CHECK GLUCOSE ONCE DAILY  . metFORMIN (GLUCOPHAGE) 500 MG tablet Take 1 tablet (500 mg total) by mouth 2 (two) times daily with a meal.  . mometasone (NASONEX) 50 MCG/ACT nasal spray Place 2 sprays into the nose daily.  . ranitidine (ZANTAC) 300 MG tablet Take 1 tablet (300 mg total) by mouth at bedtime.  . [DISCONTINUED] lisinopril (PRINIVIL,ZESTRIL) 5 MG tablet Take 1 tablet (5 mg total) by mouth daily.   Current Facility-Administered  Medications for the 03/14/18 encounter (Office Visit) with Jayden Rudge, Cristal Deerhristopher, MD  Medication  . carbamide peroxide (DEBROX) 6.5 % otic solution 5 drop    Allergies: Nsaids  Social History   Tobacco Use  . Smoking status: Never Smoker  . Smokeless tobacco: Never Used  Substance Use Topics  . Alcohol use: No  . Drug use:  No    Family History  Problem Relation Age of Onset  . Diabetes Mother   . Cerebral aneurysm Father     Review of Systems: A 12-system review of systems was performed and was negative except as noted in the HPI.  --------------------------------------------------------------------------------------------------  Physical Exam: BP (!) 144/76 (BP Location: Left Arm, Patient Position: Sitting, Cuff Size: Normal)   Pulse (!) 56   Ht 5\' 5"  (1.651 m)   Wt 194 lb 4 oz (88.1 kg)   BMI 32.32 kg/m   General: NAD. HEENT: No conjunctival pallor or scleral icterus. Moist mucous membranes.  OP clear. Neck: Supple without lymphadenopathy, thyromegaly, JVD, or HJR. Lungs: Normal work of breathing. Clear to auscultation bilaterally without wheezes or crackles. Heart: Bradycardic but regular without murmurs or rubs.  Unable to assess PMI due to body habitus. Abd: Bowel sounds present. Soft, NT/ND without hepatosplenomegaly Ext: No lower extremity edema. Skin: Warm and dry without rash.  EKG: Sinus bradycardia with PVCs and LBBB.  Heart rate 56 bpm.  Lab Results  Component Value Date   WBC 6.2 02/06/2018   HGB 11.5 (L) 02/06/2018   HCT 35.8 02/06/2018   MCV 86.5 02/06/2018   PLT 266 02/06/2018    Lab Results  Component Value Date   NA 138 02/06/2018   K 4.2 02/06/2018   CL 108 02/06/2018   CO2 22 02/06/2018   BUN 20 02/06/2018   CREATININE 0.74 02/06/2018   GLUCOSE 101 (H) 02/06/2018   ALT 12 (L) 02/05/2018    Lab Results  Component Value Date   CHOL 184 01/12/2018   HDL 79 01/12/2018   LDLCALC 97 01/12/2018   TRIG 41 01/12/2018   CHOLHDL 2.3 01/12/2018   Lab Results  Component Value Date   TSH 3.394 02/06/2018    --------------------------------------------------------------------------------------------------  ASSESSMENT AND PLAN: Sinus bradycardia Persistent but asymptomatic.  Echo and myocardial perfusion stress test were reassuring.  Patient mounted appropriate  chronotropic response while in the hospital last month.  No further work-up.  Avoid beta-blockers and calcium channel blockers.  There is no indication for pacemaker.  PSVT and NSVT Incidentally noted on event monitor.  [name] was asymptomatic.  She denies lightheadedness.  Given prior echo with normal LVEF and myocardial perfusion stress test without ischemia or scar, further work-up is not recommended at this time.  Electrolytes and TSH were also normal during admission in March.  Hypertension Blood pressure mildly elevated today.  [name] has developed a cough on lisinopril.  We have agreed to discontinue lisinopril and add olmesartan 20 mg daily.  Of note, she was previously tolerating losartan well but was unable to procure the medication due to recall.  We will check a basic metabolic panel in about 2 weeks with blood pressure check at that time as well.  Follow-up: Return to clinic in 3 months.  Madison Kendallhristopher Jaidy Cottam, MD 03/16/2018 12:45 PM

## 2018-03-16 ENCOUNTER — Encounter: Payer: Self-pay | Admitting: Internal Medicine

## 2018-03-16 DIAGNOSIS — I471 Supraventricular tachycardia, unspecified: Secondary | ICD-10-CM | POA: Insufficient documentation

## 2018-03-16 DIAGNOSIS — I4729 Other ventricular tachycardia: Secondary | ICD-10-CM | POA: Insufficient documentation

## 2018-03-16 DIAGNOSIS — I472 Ventricular tachycardia: Secondary | ICD-10-CM | POA: Insufficient documentation

## 2018-03-16 DIAGNOSIS — R001 Bradycardia, unspecified: Secondary | ICD-10-CM | POA: Insufficient documentation

## 2018-03-28 ENCOUNTER — Other Ambulatory Visit (INDEPENDENT_AMBULATORY_CARE_PROVIDER_SITE_OTHER): Payer: Medicare Other | Admitting: *Deleted

## 2018-03-28 ENCOUNTER — Ambulatory Visit (INDEPENDENT_AMBULATORY_CARE_PROVIDER_SITE_OTHER): Payer: Medicare Other | Admitting: *Deleted

## 2018-03-28 VITALS — BP 142/90 | HR 57 | Resp 18 | Ht 65.0 in

## 2018-03-28 DIAGNOSIS — I10 Essential (primary) hypertension: Secondary | ICD-10-CM

## 2018-03-28 NOTE — Progress Notes (Signed)
1.) Reason for visit: BP check  2.) Name of MD requesting visit: Dr. Okey Dupre  3.) H&P: Hypertension, Bradycardia, and hyperlipidemia  4.) ROS related to problem: Patient states that her cough has gotten better since stopping the lisinopril. She denies any problems today. Blood pressure was slightly elevated but she reports that she did not take her morning medications because of the lab work that they were doing.   5.) Assessment and plan: Reviewed all medications with her and recommended that they try and get a blood pressure cuff for home use and routine BP monitoring. Her and daughter verbalized understanding and will look for one.

## 2018-03-29 LAB — BASIC METABOLIC PANEL
BUN/Creatinine Ratio: 22 (ref 12–28)
BUN: 15 mg/dL (ref 8–27)
CALCIUM: 9.3 mg/dL (ref 8.7–10.3)
CHLORIDE: 103 mmol/L (ref 96–106)
CO2: 22 mmol/L (ref 20–29)
Creatinine, Ser: 0.69 mg/dL (ref 0.57–1.00)
GFR calc Af Amer: 92 mL/min/{1.73_m2} (ref >59)
GFR calc non Af Amer: 80 mL/min/{1.73_m2} (ref >59)
Glucose: 112 mg/dL — ABNORMAL HIGH (ref 65–99)
Potassium: 4.7 mmol/L (ref 3.5–5.2)
Sodium: 140 mmol/L (ref 134–144)

## 2018-03-29 LAB — MAGNESIUM: Magnesium: 1.9 mg/dL (ref 1.6–2.3)

## 2018-04-20 ENCOUNTER — Telehealth: Payer: Self-pay | Admitting: Family Medicine

## 2018-04-20 NOTE — Telephone Encounter (Signed)
Called to schedule Medicare Annual Wellness Visit with Nurse Health Advisor. If patient returns call, please schedule AWV with NHA any date  ° °Thank you! °For any questions please contact: °Kathryn Brown °[phone]  °Skype [email or web address]  ° °

## 2018-04-26 ENCOUNTER — Ambulatory Visit: Payer: Medicare Other | Admitting: Family Medicine

## 2018-04-30 ENCOUNTER — Encounter: Payer: Self-pay | Admitting: Family Medicine

## 2018-04-30 ENCOUNTER — Ambulatory Visit (INDEPENDENT_AMBULATORY_CARE_PROVIDER_SITE_OTHER): Payer: Medicare Other | Admitting: Family Medicine

## 2018-04-30 VITALS — BP 120/82 | HR 64 | Ht 64.0 in | Wt 190.0 lb

## 2018-04-30 DIAGNOSIS — R058 Other specified cough: Secondary | ICD-10-CM

## 2018-04-30 DIAGNOSIS — R001 Bradycardia, unspecified: Secondary | ICD-10-CM

## 2018-04-30 DIAGNOSIS — I10 Essential (primary) hypertension: Secondary | ICD-10-CM

## 2018-04-30 DIAGNOSIS — Z96652 Presence of left artificial knee joint: Secondary | ICD-10-CM

## 2018-04-30 DIAGNOSIS — R05 Cough: Secondary | ICD-10-CM

## 2018-04-30 DIAGNOSIS — T464X5A Adverse effect of angiotensin-converting-enzyme inhibitors, initial encounter: Secondary | ICD-10-CM

## 2018-04-30 NOTE — Assessment & Plan Note (Signed)
Recheck after switch ace to arb. Continue Benicar 20 mg and amlodipine 2.5 mg.

## 2018-04-30 NOTE — Assessment & Plan Note (Signed)
Patient to avoid daily NSAID,suggest otc chondroitin.

## 2018-04-30 NOTE — Progress Notes (Signed)
Name: [name]   MRN: [id number]    DOB: November 13, 1932   Date:04/30/2018       Progress Note  Subjective  Chief Complaint  Chief Complaint  Patient presents with  . Follow-up    has a cough still after stopping lisinopril/ on amlodipine    Cough  This is a new problem. The problem has been gradually improving. The problem occurs hourly. The cough is non-productive. Pertinent negatives include no chest pain, chills, ear congestion, ear pain, fever, headaches, heartburn, hemoptysis, myalgias, nasal congestion, postnasal drip, rash, rhinorrhea, sore throat, shortness of breath, sweats, weight loss or wheezing. Exacerbated by: ? allergies. Treatments tried: stopped lisinopril. There is no history of environmental allergies.  Hypertension  This is a chronic problem. The current episode started more than 1 year ago. The problem is unchanged. The problem is controlled. Pertinent negatives include no blurred vision, chest pain, headaches, malaise/fatigue, neck pain, palpitations, shortness of breath or sweats. Risk factors for coronary artery disease include dyslipidemia. Past treatments include angiotensin blockers and calcium channel blockers. The current treatment provides mild improvement. There are no compliance problems.  There is no history of angina, kidney disease, CAD/MI, CVA, heart failure, left ventricular hypertrophy, PVD or retinopathy. There is no history of chronic renal disease, a hypertension causing med or renovascular disease.    Essential hypertension Recheck after switch ace to arb. Continue Benicar 20 mg and amlodipine 2.5 mg.  Sinus bradycardia Pulse rate today is 58 bpm but is asymptomatic.  Status post total left knee replacement Patient to avoid daily NSAID,suggest otc chondroitin.   Past Medical History:  Diagnosis Date  . GERD (gastroesophageal reflux disease)   . Hypertension   . Right knee DJD   . Type II diabetes mellitus (HCC)     Past Surgical History:   Procedure Laterality Date  . BACK SURGERY    . CATARACT EXTRACTION Bilateral 03/2016  . CERVICAL DISC SURGERY  12/28/2010   "bulging disc; went in on the left side of my neck" (08/19/2013)  . JOINT REPLACEMENT    . TOTAL KNEE ARTHROPLASTY Left 03/28/2011   Dr Thurston Hole  . TOTAL KNEE ARTHROPLASTY Right 08/19/2013  . TOTAL KNEE ARTHROPLASTY Right 08/19/2013   Procedure: TOTAL KNEE ARTHROPLASTY;  Surgeon: Nilda Simmer, MD;  Location: [facility] OR;  Service: Orthopedics;  Laterality: Right;    Family History  Problem Relation Age of Onset  . Diabetes Mother   . Cerebral aneurysm Father     Social History   Socioeconomic History  . Marital status: Widowed    Spouse name: Not on file  . Number of children: Not on file  . Years of education: Not on file  . Highest education level: Not on file  Occupational History  . Not on file  Social Needs  . Financial resource strain: Not on file  . Food insecurity:    Worry: Not on file    Inability: Not on file  . Transportation needs:    Medical: Not on file    Non-medical: Not on file  Tobacco Use  . Smoking status: Never Smoker  . Smokeless tobacco: Never Used  Substance and Sexual Activity  . Alcohol use: No  . Drug use: No  . Sexual activity: Never  Lifestyle  . Physical activity:    Days per week: Not on file    Minutes per session: Not on file  . Stress: Not on file  Relationships  . Social connections:    Talks  on phone: Not on file    Gets together: Not on file    Attends religious service: Not on file    Active member of club or organization: Not on file    Attends meetings of clubs or organizations: Not on file    Relationship status: Not on file  . Intimate partner violence:    Fear of current or ex partner: Not on file    Emotionally abused: Not on file    Physically abused: Not on file    Forced sexual activity: Not on file  Other Topics Concern  . Not on file  Social History Narrative  . Not on file    Allergies   Allergen Reactions  . Nsaids   . Lisinopril     Cough    Outpatient Medications Prior to Visit  Medication Sig Dispense Refill  . acetaminophen (TYLENOL) 325 MG tablet Take 2 tablets (650 mg total) by mouth every 6 (six) hours as needed.    Marland Kitchen. amLODipine (NORVASC) 2.5 MG tablet Take 1 tablet (2.5 mg total) by mouth daily. 30 tablet 6  . aspirin 81 MG tablet Take 81 mg by mouth daily.    . cyanocobalamin 100 MCG tablet Take 1 tablet by mouth daily.     . diclofenac sodium (VOLTAREN) 1 % GEL Apply 4 g topically 4 (four) times daily as needed.    Marland Kitchen. glucose blood (ONE TOUCH ULTRA TEST) test strip USE ONE STRIP TO CHECK GLUCOSE ONCE DAILY 50 each 1  . metFORMIN (GLUCOPHAGE) 500 MG tablet Take 1 tablet (500 mg total) by mouth 2 (two) times daily with a meal. 60 tablet 5  . mometasone (NASONEX) 50 MCG/ACT nasal spray Place 2 sprays into the nose daily. 17 g 12  . olmesartan (BENICAR) 20 MG tablet Take 1 tablet (20 mg total) by mouth daily. 90 tablet 2  . ranitidine (ZANTAC) 300 MG tablet Take 1 tablet (300 mg total) by mouth at bedtime. 30 tablet 5   Facility-Administered Medications Prior to Visit  Medication Dose Route Frequency Provider Last Rate Last Dose  . carbamide peroxide (DEBROX) 6.5 % otic solution 5 drop  5 drop Both EARS Once Duanne LimerickJones, Sarajean Dessert C, MD        Review of Systems  Constitutional: Negative for chills, fever, malaise/fatigue and weight loss.  HENT: Negative for ear discharge, ear pain, postnasal drip, rhinorrhea and sore throat.   Eyes: Negative for blurred vision.  Respiratory: Positive for cough. Negative for hemoptysis, sputum production, shortness of breath and wheezing.   Cardiovascular: Negative for chest pain, palpitations and leg swelling.  Gastrointestinal: Negative for abdominal pain, blood in stool, constipation, diarrhea, heartburn, melena and nausea.  Genitourinary: Negative for dysuria, frequency, hematuria and urgency.  Musculoskeletal: Negative for back  pain, joint pain, myalgias and neck pain.  Skin: Negative for rash.  Neurological: Negative for dizziness, tingling, sensory change, focal weakness and headaches.  Endo/Heme/Allergies: Negative for environmental allergies and polydipsia. Does not bruise/bleed easily.  Psychiatric/Behavioral: Negative for depression and suicidal ideas. The patient is not nervous/anxious and does not have insomnia.      Objective  Vitals:   04/30/18 1406  BP: 120/82  Pulse: 64  Weight: 190 lb (86.2 kg)  Height: 5\' 4"  (1.626 m)    Physical Exam  Constitutional: She is oriented to person, place, and time. She appears well-developed and well-nourished.  HENT:  Head: Normocephalic.  Right Ear: External ear normal.  Left Ear: External ear normal.  Mouth/Throat: Oropharynx  is clear and moist.  Eyes: Pupils are equal, round, and reactive to light. Conjunctivae and EOM are normal. Lids are everted and swept, no foreign bodies found. Left eye exhibits no hordeolum. No foreign body present in the left eye. Right conjunctiva is not injected. Left conjunctiva is not injected. No scleral icterus.  Neck: Normal range of motion. Neck supple. No JVD present. No tracheal deviation present. No thyromegaly present.  Cardiovascular: Normal rate, regular rhythm, normal heart sounds and intact distal pulses. Exam reveals no gallop and no friction rub.  No murmur heard. Pulmonary/Chest: Effort normal and breath sounds normal. No respiratory distress. She has no wheezes. She has no rales.  Abdominal: Soft. Bowel sounds are normal. She exhibits no mass. There is no hepatosplenomegaly. There is no tenderness. There is no rebound and no guarding.  Musculoskeletal: Normal range of motion. She exhibits no edema or tenderness.  Lymphadenopathy:    She has no cervical adenopathy.  Neurological: She is alert and oriented to person, place, and time. She has normal strength. She displays normal reflexes. No cranial nerve deficit.   Skin: Skin is warm. No rash noted.  Psychiatric: She has a normal mood and affect. Her mood appears not anxious. She does not exhibit a depressed mood.  Nursing note and vitals reviewed.     Assessment & Plan  Problem List Items Addressed This Visit      Cardiovascular and Mediastinum   Essential hypertension - Primary    Recheck after switch ace to arb. Continue Benicar 20 mg and amlodipine 2.5 mg.      Sinus bradycardia    Pulse rate today is 58 bpm but is asymptomatic.        Other   Status post total left knee replacement    Patient to avoid daily NSAID,suggest otc chondroitin.       Other Visit Diagnoses    Cough due to ACE inhibitor       Improvement of cough off ace/continue current dose Benicar.      No orders of the defined types were placed in this encounter.     Dr. Hayden [facility]  04/30/18

## 2018-04-30 NOTE — Assessment & Plan Note (Signed)
Pulse rate today is 58 bpm but is asymptomatic.

## 2018-04-30 NOTE — Patient Instructions (Signed)
Chondroitin; Glucosamine tablets or capsules What is this medicine? CHONDROITIN; GLUCOSAMINE (chon DROI tin; gloo KOH suh meen) is a dietary supplement. It is promoted for its ability to reduce the symptoms of osteoarthritis by maintaining healthy joint cartilage. The FDA has not approved this supplement for any medical use. This medicine may be used for other purposes; ask your health care provider or pharmacist if you have questions. COMMON BRAND NAME(S): OptiFlex Complete, OptiFlex Sport What should I tell my health care provider before I take this medicine? They need to know if you have any of these conditions: -diabetes -heart disease -kidney disease -liver disease -stomach or intestinal problems -an unusual or allergic reaction to chondroitin, glucosamine, sulfonamides, other medicines, foods, dyes, or preservatives -pregnant or trying to get pregnant -breast-feeding How should I use this medicine? Take this supplement by mouth with a glass of water. Follow the directions on the package labeling, or take as directed by your health care professional. Take your medicine at regular intervals. Do not take this supplement more often than directed. Talk to your pediatrician regarding the use of this medicine in children. Special care may be needed. Overdosage: If you think you have taken too much of this medicine contact a poison control center or emergency room at once. NOTE: This medicine is only for you. Do not share this medicine with others. What if I miss a dose? If you miss a dose, take it as soon as you can. If it is almost time for your next dose, take only that dose. Do not take double or extra doses. What may interact with this medicine? Check with your doctor or healthcare professional if you are taking any of the following medications: -warfarin This list may not describe all possible interactions. Give your health care provider a list of all the medicines, herbs,  non-prescription drugs, or dietary supplements you use. Also tell them if you smoke, drink alcohol, or use illegal drugs. Some items may interact with your medicine. What should I watch for while using this medicine? Tell your doctor or healthcare professional if your symptoms do not start to get better or if they get worse. This supplement may take several weeks to work for you. If you are scheduled for any medical or dental procedure, tell your healthcare provider that you are taking this supplement. You may need to stop taking this supplement before the procedure. This supplement may affect blood sugar levels. If you have diabetes, check with your doctor or health care professional before you change your diet or the dose of your diabetic medicine. Herbal or dietary supplements are not regulated like medicines. Rigid quality control standards are not required for dietary supplements. The purity and strength of these products can vary. The safety and effect of this dietary supplement for a certain disease or illness is not well known. This product is not intended to diagnose, treat, cure or prevent any disease. The Food and Drug Administration suggests the following to help consumers protect themselves: -Always read product labels and follow directions. -Natural does not mean a product is safe for humans to take. -Look for products that include USP after the ingredient name. This means that the manufacturer followed the standards of the [place] Pharmacopoeia. -Supplements made or sold by a nationally known food or drug company are more likely to be made under tight controls. You can write to the company for more information about how the product was made. What side effects may I notice from receiving this   medicine? Side effects that you should report to your doctor or health care professional as soon as possible: -allergic reactions like skin rash, itching or hives, swelling of the face, lips, or  tongue -breathing problems -constipation -diarrhea -difficulty sleeping -drowsiness -hair loss -headache -loss of appetite -stomach pain -swelling of the ankles or feet Side effects that usually do not require medical attention (report to your doctor or health care professional if they continue or are bothersome): -gas -nausea -upset stomach This list may not describe all possible side effects. Call your doctor for medical advice about side effects. You may report side effects to FDA at 1-800-FDA-1088. Where should I keep my medicine? Keep out of the reach of children. Store at room temperature between 15 and 30 degrees C (59 and 86 degrees F) or as directed on the package label. Protect from moisture. Throw away any unused supplement after the expiration date. NOTE: This sheet is a summary. It may not cover all possible information. If you have questions about this medicine, talk to your doctor, pharmacist, or health care provider.  2018 Elsevier/Gold Standard (2014-12-01 06:58:19)  

## 2018-06-06 ENCOUNTER — Ambulatory Visit: Payer: Medicare Other | Admitting: Internal Medicine

## 2018-07-06 NOTE — Progress Notes (Signed)
Cardiology Office Note Date:  07/09/2018  Patient ID:  [name], [name] October 29, 1932, MRN [id number] PCP:  Madison Limerick, MD  Cardiologist:  Dr. Okey Dupre, MD    Chief Complaint: Follow-up  History of Present Illness: [name] is a 84 y.o. female with history of bradycardia with paroxysmal SVT and NSVT, DM 2, hypertension, and GERD who presents for follow-up of the above.  She was admitted to [facility] in 01/2018 with dizziness following discontinuation of losartan secondary to medication recall.  She was noted to have significant sinus bradycardia in the setting of uncontrolled hypertension.  Outside of some mild dizziness she was asymptomatic.  Echocardiogram on 02/06/2018 showed an EF of 65 to 70%, grade 1 diastolic dysfunction, mild to moderate aortic insufficiency, mildly dilated left atrium, RVSF normal, PASP 35 to 40 mmHg.  Myoview on 02/07/2018 was without ischemia or scar, LVEF 54%, appropriate chronotropic response noted, low risk study.  Electrolytes and TSH unrevealing.  Subsequent outpatient cardiac monitoring showed a predominant sinus rhythm with an average heart rate of 66 bpm, occasional PVCs, rare PACs, brief runs of SVT and NSVT.  Patient was last seen in the office on 03/14/2018 and noted a nonproductive cough that began after she left the hospital.  Of note, she was started on lisinopril during that admission due to the recall of losartan.  She was otherwise noted to be feeling relatively well.  Blood pressure at the time was noted to be 144/76 with a heart rate of 56 bpm.  Weight 194 pounds.  Due to her cough, lisinopril was discontinued and she was started on olmesartan 20 mg daily.  Follow-up RN visit/lab visit on 03/28/2018 demonstrated serum creatinine 0.69, potassium 4.7, magnesium 1.9.  BP at that time was noted to be 142/90 with a heart rate of 57 bpm.  It was noted the patient had yet to take her medications at that time.  She comes in accompanied by her daughter today. She  has done well from a cardiac perspective since she was last seen. Since stopping lisinopril and starting Benicar, her cough has resolved. She is tolerating Benicar and amlodipine without issues. She does not have a BP cuff for at home use. She feels well and has had no chest pain, SOB, palpitations, diaphoresis, dizziness, presyncope, or syncope. No falls. No orthopnea, PND, lower extremity swelling, or early satiety.   Past Medical History:  Diagnosis Date  . GERD (gastroesophageal reflux disease)   . Hypertension   . Right knee DJD   . Type II diabetes mellitus (HCC)     Past Surgical History:  Procedure Laterality Date  . BACK SURGERY    . CATARACT EXTRACTION Bilateral 03/2016  . CERVICAL DISC SURGERY  12/28/2010   "bulging disc; went in on the left side of my neck" (08/19/2013)  . JOINT REPLACEMENT    . TOTAL KNEE ARTHROPLASTY Left 03/28/2011   Dr Thurston Hole  . TOTAL KNEE ARTHROPLASTY Right 08/19/2013  . TOTAL KNEE ARTHROPLASTY Right 08/19/2013   Procedure: TOTAL KNEE ARTHROPLASTY;  Surgeon: Nilda Simmer, MD;  Location: [facility] OR;  Service: Orthopedics;  Laterality: Right;    Current Meds  Medication Sig  . acetaminophen (TYLENOL) 325 MG tablet Take 2 tablets (650 mg total) by mouth every 6 (six) hours as needed.  Marland Kitchen amLODipine (NORVASC) 2.5 MG tablet Take 1 tablet (2.5 mg total) by mouth daily.  Marland Kitchen aspirin 81 MG tablet Take 81 mg by mouth daily.  . cyanocobalamin 100 MCG tablet  Take 1 tablet by mouth daily.   . diclofenac sodium (VOLTAREN) 1 % GEL Apply 4 g topically 4 (four) times daily as needed.  Marland Kitchen. glucose blood (ONE TOUCH ULTRA TEST) test strip USE ONE STRIP TO CHECK GLUCOSE ONCE DAILY  . metFORMIN (GLUCOPHAGE) 500 MG tablet Take 1 tablet (500 mg total) by mouth 2 (two) times daily with a meal.  . mometasone (NASONEX) 50 MCG/ACT nasal spray Place 2 sprays into the nose daily.  Marland Kitchen. olmesartan (BENICAR) 20 MG tablet Take 1 tablet (20 mg total) by mouth daily.  . ranitidine (ZANTAC) 300 MG  tablet Take 1 tablet (300 mg total) by mouth at bedtime.   Current Facility-Administered Medications for the 07/09/18 encounter (Office Visit) with Sondra Bargesunn, Tariana Moldovan M, PA-C  Medication  . carbamide peroxide (DEBROX) 6.5 % otic solution 5 drop    Allergies:   Nsaids and Lisinopril   Social History:  The patient  reports that she has never smoked. She has never used smokeless tobacco. She reports that she does not drink alcohol or use drugs.   Family History:  The patient's family history includes Cerebral aneurysm in her father; Diabetes in her mother.  ROS:   Review of Systems  Constitutional: Negative for chills, diaphoresis, fever, malaise/fatigue and weight loss.  HENT: Negative for congestion.   Eyes: Negative for discharge and redness.  Respiratory: Negative for cough, hemoptysis, sputum production, shortness of breath and wheezing.   Cardiovascular: Negative for chest pain, palpitations, orthopnea, claudication, leg swelling and PND.  Gastrointestinal: Negative for abdominal pain, blood in stool, heartburn, melena, nausea and vomiting.  Genitourinary: Negative for hematuria.  Musculoskeletal: Negative for falls and myalgias.  Skin: Negative for rash.  Neurological: Negative for dizziness, tingling, tremors, sensory change, speech change, focal weakness, loss of consciousness and weakness.  Endo/Heme/Allergies: Does not bruise/bleed easily.  Psychiatric/Behavioral: Negative for substance abuse. The patient is not nervous/anxious.   All other systems reviewed and are negative.    PHYSICAL EXAM:  VS:  BP 140/60 (BP Location: Left Arm, Patient Position: Sitting, Cuff Size: Large)   Pulse 66   Ht 5\' 5"  (1.651 m)   Wt 189 lb 8 oz (86 kg)   BMI 31.53 kg/m  BMI: Body mass index is 31.53 kg/m.  Physical Exam  Constitutional: She is oriented to person, place, and time. She appears well-developed and well-nourished.  HENT:  Head: Normocephalic and atraumatic.  Eyes: Right eye exhibits  no discharge. Left eye exhibits no discharge.  Neck: Normal range of motion. No JVD present.  Cardiovascular: Normal rate, regular rhythm, S1 normal and S2 normal. Exam reveals no distant heart sounds, no friction rub, no midsystolic click and no opening snap.  Murmur heard. High-pitched blowing decrescendo early diastolic murmur is present with a grade of 2/6 at the upper right sternal border radiating to the apex. Pulses:      Posterior tibial pulses are 2+ on the right side, and 2+ on the left side.  Pulmonary/Chest: Effort normal and breath sounds normal. No respiratory distress. She has no decreased breath sounds. She has no wheezes. She has no rales. She exhibits no tenderness.  Abdominal: Soft. She exhibits no distension. There is no tenderness.  Musculoskeletal: She exhibits no edema.  Neurological: She is alert and oriented to person, place, and time.  Skin: Skin is warm and dry. No cyanosis. Nails show no clubbing.  Psychiatric: She has a normal mood and affect. Her speech is normal and behavior is normal. Judgment and thought  content normal.     EKG:  Was ordered and interpreted by me today. Shows NSR, 66 bpm, occasional PVCs, LBBB (known)  Recent Labs: 02/05/2018: ALT 12 02/06/2018: Hemoglobin 11.5; Platelets 266; TSH 3.394 03/28/2018: BUN 15; Creatinine, Ser 0.69; Magnesium 1.9; Potassium 4.7; Sodium 140  01/12/2018: Chol/HDL Ratio 2.3; Cholesterol, Total 184; HDL 79; LDL Calculated 97; Triglycerides 41   CrCl cannot be calculated (Patient's most recent lab result is older than the maximum 21 days allowed.).   Wt Readings from Last 3 Encounters:  07/09/18 189 lb 8 oz (86 kg)  04/30/18 190 lb (86.2 kg)  03/14/18 194 lb 4 oz (88.1 kg)     Other studies reviewed: Additional studies/records reviewed today include: summarized above  ASSESSMENT AND PLAN:  1. Essential hypertension: Blood pressure continues to be mildly elevated today as above. She does not have a BP cuff at  home, though is interested in buying one. Increase amlodipine to 5 mg daily. Continue Benicar 20 mg daily.   2. Sinus bradycardia: Heart rate currently 66 bpm. She has demonstrated appropriate chronotropic response as detailed above with reassuring echo and Myoview. Continue to avoid beta-blockers and non-dihydropridine calcium channel blockers. No indication for PPM at this time.   3. Paroxysmal SVT/NSVT: Incidentally noted on event monitor as above. Asymptomatic. No further workup as this time given normal echo and Myoview without ischemia or scar. Not on beta blocker or non-dihydropyridine calcium channel blocker as above. Labs have been unrevealing. Continue to monitor.   4. Cough secondary to ACEi: Resolve with discontinuation of lisinopril. Tolerating Benicar without issues.   Disposition: F/u with Dr. Okey DupreEnd or an APP in 3 months.   Current medicines are reviewed at length with the patient today.  The patient did not have any concerns regarding medicines.  Elinor DodgeSigned, Philomene Haff PA-C 07/09/2018 2:48 PM     [facility] [address] [place], [place] [phone]

## 2018-07-09 ENCOUNTER — Encounter: Payer: Self-pay | Admitting: Physician Assistant

## 2018-07-09 ENCOUNTER — Ambulatory Visit (INDEPENDENT_AMBULATORY_CARE_PROVIDER_SITE_OTHER): Payer: Medicare Other | Admitting: Physician Assistant

## 2018-07-09 VITALS — BP 140/60 | HR 66 | Ht 65.0 in | Wt 189.5 lb

## 2018-07-09 DIAGNOSIS — I472 Ventricular tachycardia: Secondary | ICD-10-CM

## 2018-07-09 DIAGNOSIS — I471 Supraventricular tachycardia: Secondary | ICD-10-CM | POA: Diagnosis not present

## 2018-07-09 DIAGNOSIS — R001 Bradycardia, unspecified: Secondary | ICD-10-CM

## 2018-07-09 DIAGNOSIS — I10 Essential (primary) hypertension: Secondary | ICD-10-CM | POA: Diagnosis not present

## 2018-07-09 DIAGNOSIS — R05 Cough: Secondary | ICD-10-CM | POA: Diagnosis not present

## 2018-07-09 DIAGNOSIS — I4729 Other ventricular tachycardia: Secondary | ICD-10-CM

## 2018-07-09 DIAGNOSIS — T464X5A Adverse effect of angiotensin-converting-enzyme inhibitors, initial encounter: Secondary | ICD-10-CM

## 2018-07-09 MED ORDER — AMLODIPINE BESYLATE 5 MG PO TABS: 5.0000 mg | ORAL_TABLET | Freq: Every day | ORAL | 3 refills | Status: DC

## 2018-07-09 NOTE — Patient Instructions (Signed)
Medication Instructions:  Your physician has recommended you make the following change in your medication:  1- INCREASE Amlodipine to 5 mg by mouth once a day.   Labwork: none  Testing/Procedures: none  Follow-Up: Your physician recommends that you schedule a follow-up appointment in: 6 MONTHS WITH DR END OR APP.  If you need a refill on your cardiac medications before your next appointment, please call your pharmacy.

## 2018-08-12 IMAGING — DX DG CHEST 1V PORT
1 series · 1 of 1 positions shown · non-contrast
Comparison: 08/12/2013.

CLINICAL DATA: Patient became dizzy and fell yesterday.

EXAM:
PORTABLE CHEST 1 VIEW

[chest ap]
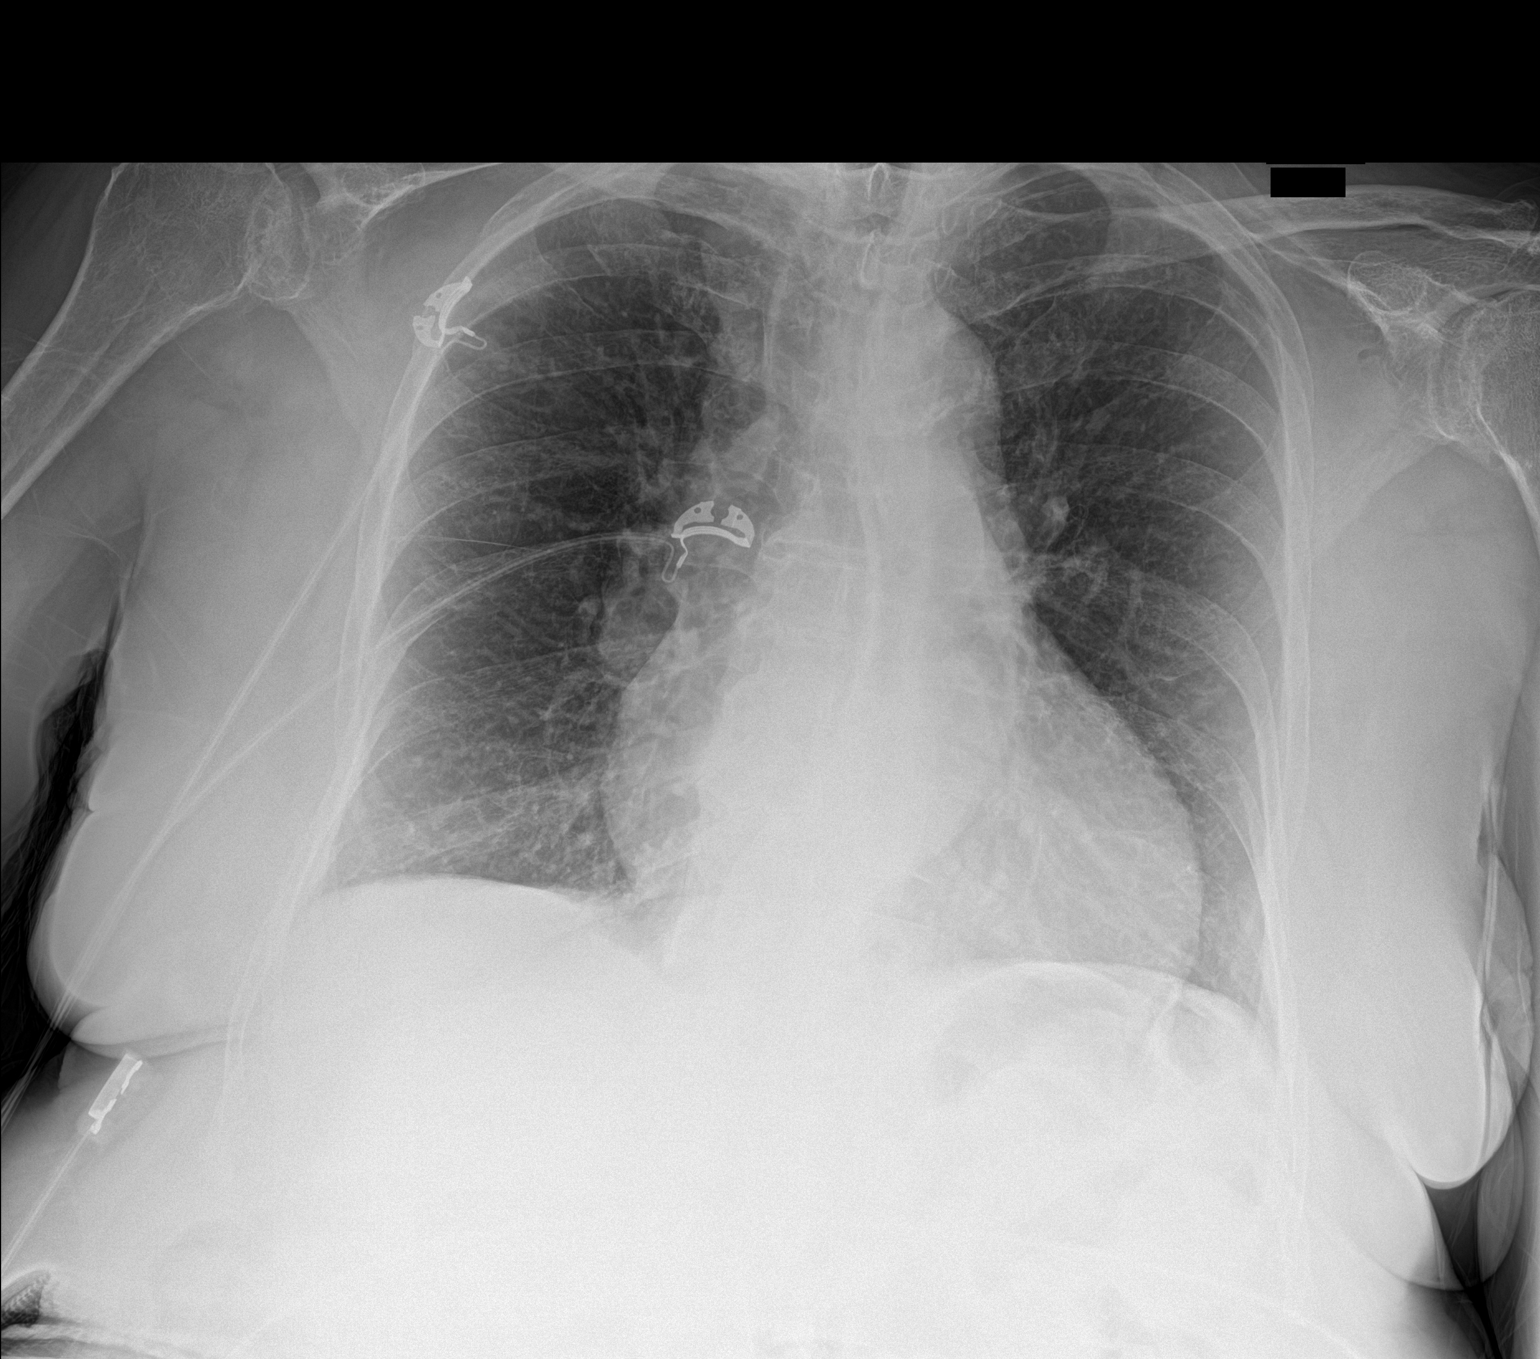

[1 of 1 positions shown; findings below may reference images not displayed]

FINDINGS: The heart is enlarged. There is no consolidation or edema. No
effusion or pneumothorax. Skeletal osteopenia. Tortuous aorta.
Similar appearance to priors.
IMPRESSION: Cardiomegaly.  No active disease.

## 2018-08-16 ENCOUNTER — Other Ambulatory Visit: Payer: Self-pay

## 2018-08-16 ENCOUNTER — Telehealth: Payer: Self-pay

## 2018-08-16 MED ORDER — OMEPRAZOLE 20 MG PO CPDR: 20.0000 mg | DELAYED_RELEASE_CAPSULE | Freq: Every day | ORAL | 3 refills | Status: DC

## 2018-08-16 NOTE — Telephone Encounter (Signed)
Pt called in wanting to switch back to omeprazole for acid reflux. The ranitidine she has was recalled and it wasn't helping "as much as the omeprazole did." Sent in the omeprazole to [facility]

## 2018-09-09 ENCOUNTER — Other Ambulatory Visit: Payer: Self-pay | Admitting: Family Medicine

## 2018-09-09 DIAGNOSIS — E119 Type 2 diabetes mellitus without complications: Secondary | ICD-10-CM

## 2018-10-01 ENCOUNTER — Encounter: Payer: Self-pay | Admitting: Family Medicine

## 2018-10-01 ENCOUNTER — Ambulatory Visit (INDEPENDENT_AMBULATORY_CARE_PROVIDER_SITE_OTHER): Payer: Medicare Other | Admitting: Family Medicine

## 2018-10-01 VITALS — BP 130/80 | HR 76 | Ht 65.0 in | Wt 194.0 lb

## 2018-10-01 DIAGNOSIS — E119 Type 2 diabetes mellitus without complications: Secondary | ICD-10-CM | POA: Diagnosis not present

## 2018-10-01 DIAGNOSIS — I10 Essential (primary) hypertension: Secondary | ICD-10-CM | POA: Diagnosis not present

## 2018-10-01 DIAGNOSIS — E785 Hyperlipidemia, unspecified: Secondary | ICD-10-CM | POA: Diagnosis not present

## 2018-10-01 DIAGNOSIS — K219 Gastro-esophageal reflux disease without esophagitis: Secondary | ICD-10-CM

## 2018-10-01 DIAGNOSIS — Z23 Encounter for immunization: Secondary | ICD-10-CM | POA: Diagnosis not present

## 2018-10-01 DIAGNOSIS — J301 Allergic rhinitis due to pollen: Secondary | ICD-10-CM

## 2018-10-01 MED ORDER — MOMETASONE FUROATE 50 MCG/ACT NA SUSP: 2.0000 | Freq: Every day | NASAL | 12 refills | Status: DC

## 2018-10-01 MED ORDER — OMEPRAZOLE 20 MG PO CPDR: 20.0000 mg | DELAYED_RELEASE_CAPSULE | Freq: Every day | ORAL | 5 refills | Status: DC

## 2018-10-01 MED ORDER — METFORMIN HCL 500 MG PO TABS: ORAL_TABLET | ORAL | 1 refills | Status: DC

## 2018-10-01 MED ORDER — OLMESARTAN MEDOXOMIL 20 MG PO TABS: 20.0000 mg | ORAL_TABLET | Freq: Every day | ORAL | 1 refills | Status: DC

## 2018-10-01 MED ORDER — AMLODIPINE BESYLATE 5 MG PO TABS: 5.0000 mg | ORAL_TABLET | Freq: Every day | ORAL | 1 refills | Status: DC

## 2018-10-01 MED ORDER — GLUCOSE BLOOD VI STRP: ORAL_STRIP | 1 refills | Status: DC

## 2018-10-01 NOTE — Progress Notes (Signed)
Date:  10/01/2018   Name:  [name]   DOB:  [birth year]   MRN:  [id number]   Chief Complaint: Diabetes; Hypertension; Allergic Rhinitis  (has a cough with some cong); Gastroesophageal Reflux; and Flu Vaccine Diabetes  She presents for her follow-up diabetic visit. She has type 2 diabetes mellitus. Her disease course has been stable. There are no hypoglycemic associated symptoms. Pertinent negatives for hypoglycemia include no confusion, dizziness, headaches, hunger, mood changes, nervousness/anxiousness, pallor, seizures, sleepiness, speech difficulty, sweats or tremors. Pertinent negatives for diabetes include no blurred vision, no chest pain, no fatigue, no foot paresthesias, no foot ulcerations, no polydipsia, no polyphagia, no polyuria, no visual change, no weakness and no weight loss. There are no hypoglycemic complications. Symptoms are stable. There are no diabetic complications. Pertinent negatives for diabetic complications include no CVA, PVD or retinopathy. Risk factors for coronary artery disease include dyslipidemia, diabetes mellitus, hypertension and obesity. Current diabetic treatment includes oral agent (monotherapy). She is compliant with treatment some of the time. Her weight is stable. She is following a generally healthy diet. Meal planning includes avoidance of concentrated sweets and carbohydrate counting. She participates in exercise daily. Her home blood glucose trend is fluctuating minimally. Her breakfast blood glucose is taken between 8-9 am. Her breakfast blood glucose range is generally 110-130 mg/dl. An ACE inhibitor/angiotensin II receptor blocker is being taken. She does not see a podiatrist.Eye exam is not current.  Hypertension  This is a new problem. The current episode started more than 1 year ago. The problem is unchanged. The problem is controlled. Pertinent negatives include no anxiety, blurred vision, chest pain, headaches, malaise/fatigue, neck pain,  orthopnea, palpitations, peripheral edema, PND, shortness of breath or sweats. There are no associated agents to hypertension. Risk factors for coronary artery disease include dyslipidemia, obesity and diabetes mellitus. Past treatments include angiotensin blockers. There is no history of angina, kidney disease, CAD/MI, CVA, heart failure, PVD or retinopathy. There is no history of chronic renal disease, a hypertension causing med or renovascular disease.  Gastroesophageal Reflux  She reports no abdominal pain, no belching, no chest pain, no choking, no coughing, no dysphagia, no early satiety, no globus sensation, no heartburn, no hoarse voice, no nausea, no sore throat, no stridor, no tooth decay, no water brash or no wheezing. This is a chronic problem. The current episode started more than 1 year ago. The problem has been unchanged. The symptoms are aggravated by certain foods. Pertinent negatives include no anemia, fatigue, melena, muscle weakness, orthopnea or weight loss.  Hyperlipidemia  This is a chronic problem. The current episode started more than 1 year ago. The problem is controlled. Recent lipid tests were reviewed and are normal. Exacerbating diseases include diabetes and obesity. She has no history of chronic renal disease, hypothyroidism or liver disease. Pertinent negatives include no chest pain, myalgias or shortness of breath. Current antihyperlipidemic treatment includes diet change. The current treatment provides moderate improvement of lipids. There are no compliance problems.      Review of Systems  Constitutional: Negative.  Negative for chills, fatigue, fever, malaise/fatigue, unexpected weight change and weight loss.  HENT: Negative for congestion, ear discharge, ear pain, hoarse voice, rhinorrhea, sinus pressure, sneezing and sore throat.   Eyes: Negative for blurred vision, photophobia, pain, discharge, redness and itching.  Respiratory: Negative for cough, choking,  shortness of breath, wheezing and stridor.   Cardiovascular: Negative for chest pain, palpitations, orthopnea and PND.  Gastrointestinal: Negative for abdominal pain, blood  in stool, constipation, diarrhea, dysphagia, heartburn, melena, nausea and vomiting.  Endocrine: Negative for cold intolerance, heat intolerance, polydipsia, polyphagia and polyuria.  Genitourinary: Negative for dysuria, flank pain, frequency, hematuria, menstrual problem, pelvic pain, urgency, vaginal bleeding and vaginal discharge.  Musculoskeletal: Negative for arthralgias, back pain, myalgias, muscle weakness and neck pain.  Skin: Negative for pallor and rash.  Allergic/Immunologic: Negative for environmental allergies and food allergies.  Neurological: Negative for dizziness, tremors, seizures, speech difficulty, weakness, light-headedness, numbness and headaches.  Hematological: Negative for adenopathy. Does not bruise/bleed easily.  Psychiatric/Behavioral: Negative for confusion and dysphoric mood. The patient is not nervous/anxious.     Patient Active Problem List   Diagnosis Date Noted  . Sinus bradycardia 03/16/2018  . PSVT (paroxysmal supraventricular tachycardia) (HCC) 03/16/2018  . NSVT (nonsustained ventricular tachycardia) (HCC) 03/16/2018  . 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 Hyperlipidemia 09/24/2015  . Diabetes mellitus with no complication (HCC) 03/16/2015  . 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 Arthritis of knee, degenerative 03/16/2015  . Sinus infection 03/16/2015  . Diabetes (HCC)   . Essential hypertension   . GERD (gastroesophageal reflux disease)   . Status post total left knee replacement     Allergies  Allergen Reactions  . Nsaids   . Lisinopril     Cough    Past Surgical History:  Procedure Laterality Date  . BACK SURGERY    . CATARACT EXTRACTION Bilateral 03/2016  . CERVICAL DISC SURGERY  12/28/2010    "bulging disc; went in on the left side of my neck" (08/19/2013)  . JOINT REPLACEMENT    . TOTAL KNEE ARTHROPLASTY Left 03/28/2011   Dr Thurston Hole  . TOTAL KNEE ARTHROPLASTY Right 08/19/2013  . TOTAL KNEE ARTHROPLASTY Right 08/19/2013   Procedure: TOTAL KNEE ARTHROPLASTY;  Surgeon: Nilda Simmer, MD;  Location: [facility] OR;  Service: Orthopedics;  Laterality: Right;    Social History   Tobacco Use  . Smoking status: Never Smoker  . Smokeless tobacco: Never Used  Substance Use Topics  . Alcohol use: No  . Drug use: No     Medication list has been reviewed and updated.  Current Meds  Medication Sig  . acetaminophen (TYLENOL) 325 MG tablet Take 2 tablets (650 mg total) by mouth every 6 (six) hours as needed.  Marland Kitchen amLODipine (NORVASC) 5 MG tablet Take 1 tablet (5 mg total) by mouth daily.  Marland Kitchen aspirin 81 MG tablet Take 81 mg by mouth daily.  . cyanocobalamin 100 MCG tablet Take 1 tablet by mouth daily.   . diclofenac sodium (VOLTAREN) 1 % GEL Apply 4 g topically 4 (four) times daily as needed.  Marland Kitchen glucose blood (ONE TOUCH ULTRA TEST) test strip USE ONE STRIP TO CHECK GLUCOSE ONCE DAILY  . metFORMIN (GLUCOPHAGE) 500 MG tablet TAKE 1 TABLET BY MOUTH TWICE DAILY WITH MEALS (LAST  TIME  FILLING)  . mometasone (NASONEX) 50 MCG/ACT nasal spray Place 2 sprays into the nose daily.  Marland Kitchen olmesartan (BENICAR) 20 MG tablet Take 1 tablet (20 mg total) by mouth daily.  Marland Kitchen omeprazole (PRILOSEC) 20 MG capsule Take 1 capsule (20 mg total) by mouth daily.   Current Facility-Administered Medications for the 10/01/18 encounter (Office Visit) with Duanne Limerick, MD  Medication  . carbamide peroxide (DEBROX) 6.5 % otic solution 5 drop    PHQ 2/9 Scores 10/01/2018 06/23/2017 05/10/2016  PHQ - 2 Score 0 0 0  PHQ- 9 Score 0 - -  Physical Exam  Constitutional: She is oriented to person, place, and time. She appears well-developed and well-nourished.  HENT:  Head: Normocephalic.  Right Ear: External ear normal.  Left  Ear: External ear normal.  Mouth/Throat: Oropharynx is clear and moist.  Eyes: Pupils are equal, round, and reactive to light. Conjunctivae and EOM are normal. Lids are everted and swept, no foreign bodies found. Left eye exhibits no hordeolum. No foreign body present in the left eye. Right conjunctiva is not injected. Left conjunctiva is not injected. No scleral icterus.  Neck: Normal range of motion. Neck supple. Normal carotid pulses, no hepatojugular reflux and no JVD present. Carotid bruit is not present. No tracheal deviation present. No thyromegaly present.  Cardiovascular: Normal rate, regular rhythm, S1 normal, S2 normal, normal heart sounds and intact distal pulses. Exam reveals no gallop, no S3, no S4 and no friction rub.  No murmur heard.  No systolic murmur is present.  No diastolic murmur is present. Pulmonary/Chest: Effort normal and breath sounds normal. No respiratory distress. She has no wheezes. She has no rales.  Abdominal: Soft. Bowel sounds are normal. She exhibits no mass. There is no hepatosplenomegaly. There is no tenderness. There is no rebound and no guarding.  Musculoskeletal: Normal range of motion. She exhibits no edema or tenderness.  Feet:  Right Foot:  Protective Sensation: 10 sites tested. 10 sites sensed.  Skin Integrity: Positive for dry skin. Negative for ulcer, blister, skin breakdown, erythema, warmth or callus.  Left Foot:  Protective Sensation: 10 sites tested. 10 sites sensed.  Skin Integrity: Positive for dry skin. Negative for ulcer, blister, skin breakdown, erythema, warmth or callus.  Lymphadenopathy:    She has no cervical adenopathy.  Neurological: She is alert and oriented to person, place, and time. She has normal strength. She displays normal reflexes. No cranial nerve deficit.  Skin: Skin is warm. No rash noted.  Psychiatric: She has a normal mood and affect. Her mood appears not anxious. She does not exhibit a depressed mood.  Nursing note  and vitals reviewed.   BP 130/80   Pulse 76   Ht 5\' 5"  (1.651 m)   Wt 194 lb (88 kg)   BMI 32.28 kg/m   Assessment and Plan:  1. Type 2 diabetes mellitus without complication, without long-term current use of insulin (HCC) Chronic Controllled Continue metformen and diet. Foot exam normal. Check A1C and renal panel. - metFORMIN (GLUCOPHAGE) 500 MG tablet; TAKE 1 TABLET BY MOUTH TWICE DAILY WITH MEALS (LAST  TIME  FILLING)  Dispense: 180 tablet; Refill: 1 - glucose blood (ONE TOUCH ULTRA TEST) test strip; USE ONE STRIP TO CHECK GLUCOSE ONCE DAILY  Dispense: 50 each; Refill: 1 - Renal Function Panel - Hemoglobin A1c - Lipid panel - Microalbumin / creatinine urine ratio  2. Acute seasonal allergic rhinitis due to pollen Acute Episodic Continue nasonex nasal as needed.  - mometasone (NASONEX) 50 MCG/ACT nasal spray; Place 2 sprays into the nose daily.  Dispense: 17 g; Refill: 12    3. Gastroesophageal reflux disease, esophagitis presence not specified Chronic Controlled Continue omeprazole 20 mg daily. - omeprazole (PRILOSEC) 20 MG capsule; Take 1 capsule (20 mg total) by mouth daily.  Dispense: 30 capsule; Refill: 5  4. Essential hypertension Chronic Stable Controlled. Continue amlodipine 5 mg and olmesartan 20 mg daily. Check renal panel - amLODipine (NORVASC) 5 MG tablet; Take 1 tablet (5 mg total) by mouth daily.  Dispense: 90 tablet; Refill: 1 - olmesartan (BENICAR) 20 MG  tablet; Take 1 tablet (20 mg total) by mouth daily.  Dispense: 90 tablet; Refill: 1 - Renal Function Panel   5. Hyperlipidemia, unspecified hyperlipidemia type Chronic and controlled. Check lipid panel - Lipid panel  6. Flu vaccine need Discussed and administered. - Flu vaccine HIGH DOSE PF (Fluzone High dose)   Dr. Elizabeth Sauer [facility]  10/01/2018

## 2018-10-02 LAB — LIPID PANEL
CHOLESTEROL TOTAL: 184 mg/dL (ref 100–199)
Chol/HDL Ratio: 2.7 ratio (ref 0.0–4.4)
HDL: 67 mg/dL (ref >39)
LDL Calculated: 109 mg/dL — ABNORMAL HIGH (ref 0–99)
Triglycerides: 40 mg/dL (ref 0–149)
VLDL Cholesterol Cal: 8 mg/dL (ref 5–40)

## 2018-10-02 LAB — RENAL FUNCTION PANEL
ALBUMIN: 4 g/dL (ref 3.5–4.7)
BUN/Creatinine Ratio: 23 (ref 12–28)
BUN: 15 mg/dL (ref 8–27)
CALCIUM: 9.3 mg/dL (ref 8.7–10.3)
CHLORIDE: 106 mmol/L (ref 96–106)
CO2: 20 mmol/L (ref 20–29)
Creatinine, Ser: 0.64 mg/dL (ref 0.57–1.00)
GFR calc Af Amer: 95 mL/min/{1.73_m2} (ref >59)
GFR, EST NON AFRICAN AMERICAN: 82 mL/min/{1.73_m2} (ref >59)
GLUCOSE: 101 mg/dL — AB (ref 65–99)
PHOSPHORUS: 3.7 mg/dL (ref 2.5–4.5)
POTASSIUM: 4.4 mmol/L (ref 3.5–5.2)
SODIUM: 141 mmol/L (ref 134–144)

## 2018-10-02 LAB — MICROALBUMIN / CREATININE URINE RATIO
Creatinine, Urine: 55.7 mg/dL
Microalb/Creat Ratio: 32.7 mg/g creat — ABNORMAL HIGH (ref 0.0–30.0)
Microalbumin, Urine: 18.2 ug/mL

## 2018-10-02 LAB — HEMOGLOBIN A1C
ESTIMATED AVERAGE GLUCOSE: 140 mg/dL
HEMOGLOBIN A1C: 6.5 % — AB (ref 4.8–5.6)

## 2018-11-23 ENCOUNTER — Ambulatory Visit (INDEPENDENT_AMBULATORY_CARE_PROVIDER_SITE_OTHER): Payer: Medicare Other | Admitting: Family Medicine

## 2018-11-23 ENCOUNTER — Encounter: Payer: Self-pay | Admitting: Family Medicine

## 2018-11-23 VITALS — BP 120/80 | HR 80 | Temp 98.3°F | Ht 65.0 in | Wt 189.0 lb

## 2018-11-23 DIAGNOSIS — J0100 Acute maxillary sinusitis, unspecified: Secondary | ICD-10-CM | POA: Diagnosis not present

## 2018-11-23 MED ORDER — AMOXICILLIN 500 MG PO CAPS: 500.0000 mg | ORAL_CAPSULE | Freq: Three times a day (TID) | ORAL | 0 refills | Status: DC

## 2018-11-23 NOTE — Patient Instructions (Signed)
Chondroitin; Glucosamine tablets or capsules What is this medicine? CHONDROITIN; GLUCOSAMINE (chon DROI tin; gloo KOH suh meen) is a dietary supplement. It is promoted for its ability to reduce the symptoms of osteoarthritis by maintaining healthy joint cartilage. The FDA has not approved this supplement for any medical use. This medicine may be used for other purposes; ask your health care provider or pharmacist if you have questions. COMMON BRAND NAME(S): OptiFlex Complete, OptiFlex Sport What should I tell my health care provider before I take this medicine? They need to know if you have any of these conditions: -diabetes -heart disease -kidney disease -liver disease -stomach or intestinal problems -an unusual or allergic reaction to chondroitin, glucosamine, sulfonamides, other medicines, foods, dyes, or preservatives -pregnant or trying to get pregnant -breast-feeding How should I use this medicine? Take this supplement by mouth with a glass of water. Follow the directions on the package labeling, or take as directed by your health care professional. Take your medicine at regular intervals. Do not take this supplement more often than directed. Talk to your pediatrician regarding the use of this medicine in children. Special care may be needed. Overdosage: If you think you have taken too much of this medicine contact a poison control center or emergency room at once. NOTE: This medicine is only for you. Do not share this medicine with others. What if I miss a dose? If you miss a dose, take it as soon as you can. If it is almost time for your next dose, take only that dose. Do not take double or extra doses. What may interact with this medicine? Check with your doctor or healthcare professional if you are taking any of the following medications: -warfarin This list may not describe all possible interactions. Give your health care provider a list of all the medicines, herbs,  non-prescription drugs, or dietary supplements you use. Also tell them if you smoke, drink alcohol, or use illegal drugs. Some items may interact with your medicine. What should I watch for while using this medicine? Tell your doctor or healthcare professional if your symptoms do not start to get better or if they get worse. This supplement may take several weeks to work for you. If you are scheduled for any medical or dental procedure, tell your healthcare provider that you are taking this supplement. You may need to stop taking this supplement before the procedure. This supplement may affect blood sugar levels. If you have diabetes, check with your doctor or health care professional before you change your diet or the dose of your diabetic medicine. Herbal or dietary supplements are not regulated like medicines. Rigid quality control standards are not required for dietary supplements. The purity and strength of these products can vary. The safety and effect of this dietary supplement for a certain disease or illness is not well known. This product is not intended to diagnose, treat, cure or prevent any disease. The Food and Drug Administration suggests the following to help consumers protect themselves: -Always read product labels and follow directions. -Natural does not mean a product is safe for humans to take. -Look for products that include USP after the ingredient name. This means that the manufacturer followed the standards of the [place] Pharmacopoeia. -Supplements made or sold by a nationally known food or drug company are more likely to be made under tight controls. You can write to the company for more information about how the product was made. What side effects may I notice from receiving this   medicine? Side effects that you should report to your doctor or health care professional as soon as possible: -allergic reactions like skin rash, itching or hives, swelling of the face, lips, or  tongue -breathing problems -constipation -diarrhea -difficulty sleeping -drowsiness -hair loss -headache -loss of appetite -stomach pain -swelling of the ankles or feet Side effects that usually do not require medical attention (report to your doctor or health care professional if they continue or are bothersome): -gas -nausea -upset stomach This list may not describe all possible side effects. Call your doctor for medical advice about side effects. You may report side effects to FDA at 1-800-FDA-1088. Where should I keep my medicine? Keep out of the reach of children. Store at room temperature between 15 and 30 degrees C (59 and 86 degrees F) or as directed on the package label. Protect from moisture. Throw away any unused supplement after the expiration date. NOTE: This sheet is a summary. It may not cover all possible information. If you have questions about this medicine, talk to your doctor, pharmacist, or health care provider.  2019 Elsevier/Gold Standard (2014-12-01 06:58:19)  

## 2018-11-23 NOTE — Progress Notes (Signed)
Date:  11/23/2018   Name:  [name]   DOB:  10/01/1932   MRN:  [id number]   Chief Complaint: Sinusitis (hoarse, runny nose, cough and wheezing - white production)  Sinusitis  This is a new problem. The current episode started in the past 7 days. The problem has been gradually worsening since onset. There has been no fever. She is experiencing no pain. Associated symptoms include congestion, coughing, headaches and sinus pressure. Pertinent negatives include no chills, diaphoresis, ear pain, hoarse voice, neck pain, shortness of breath, sneezing, sore throat or swollen glands. Past treatments include nothing. The treatment provided moderate relief.    Review of Systems  Constitutional: Negative.  Negative for chills, diaphoresis, fatigue, fever and unexpected weight change.  HENT: Positive for congestion and sinus pressure. Negative for ear discharge, ear pain, hoarse voice, rhinorrhea, sneezing and sore throat.   Eyes: Negative for photophobia, pain, discharge, redness and itching.  Respiratory: Positive for cough. Negative for shortness of breath, wheezing and stridor.   Gastrointestinal: Negative for abdominal pain, blood in stool, constipation, diarrhea, nausea and vomiting.  Endocrine: Negative for cold intolerance, heat intolerance, polydipsia, polyphagia and polyuria.  Genitourinary: Negative for dysuria, flank pain, frequency, hematuria, menstrual problem, pelvic pain, urgency, vaginal bleeding and vaginal discharge.  Musculoskeletal: Negative for arthralgias, back pain, myalgias and neck pain.  Skin: Negative for rash.  Allergic/Immunologic: Negative for environmental allergies and food allergies.  Neurological: Positive for headaches. Negative for dizziness, weakness, light-headedness and numbness.  Hematological: Negative for adenopathy. Does not bruise/bleed easily.  Psychiatric/Behavioral: Negative for dysphoric mood. The patient is not nervous/anxious.     Patient  Active Problem List   Diagnosis Date Noted  . Sinus bradycardia 03/16/2018  . PSVT (paroxysmal supraventricular tachycardia) (HCC) 03/16/2018  . NSVT (nonsustained ventricular tachycardia) (HCC) 03/16/2018  . 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 Hyperlipidemia 09/24/2015  . Diabetes mellitus with no complication (HCC) 03/16/2015  . 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 Arthritis of knee, degenerative 03/16/2015  . Sinus infection 03/16/2015  . Diabetes (HCC)   . Essential hypertension   . GERD (gastroesophageal reflux disease)   . Status post total left knee replacement     Allergies  Allergen Reactions  . Nsaids   . Lisinopril     Cough    Past Surgical History:  Procedure Laterality Date  . BACK SURGERY    . CATARACT EXTRACTION Bilateral 03/2016  . CERVICAL DISC SURGERY  12/28/2010   "bulging disc; went in on the left side of my neck" (08/19/2013)  . JOINT REPLACEMENT    . TOTAL KNEE ARTHROPLASTY Left 03/28/2011   Dr Thurston Hole  . TOTAL KNEE ARTHROPLASTY Right 08/19/2013  . TOTAL KNEE ARTHROPLASTY Right 08/19/2013   Procedure: TOTAL KNEE ARTHROPLASTY;  Surgeon: Nilda Simmer, MD;  Location: [facility] OR;  Service: Orthopedics;  Laterality: Right;    Social History   Tobacco Use  . Smoking status: Never Smoker  . Smokeless tobacco: Never Used  Substance Use Topics  . Alcohol use: No  . Drug use: No     Medication list has been reviewed and updated.  Current Meds  Medication Sig  . acetaminophen (TYLENOL) 325 MG tablet Take 2 tablets (650 mg total) by mouth every 6 (six) hours as needed.  Marland Kitchen amLODipine (NORVASC) 5 MG tablet Take 1 tablet (5 mg total) by mouth daily.  Marland Kitchen aspirin 81 MG tablet Take 81 mg by mouth  daily.  . cyanocobalamin 100 MCG tablet Take 1 tablet by mouth daily.   . diclofenac sodium (VOLTAREN) 1 % GEL Apply 4 g topically 4 (four) times daily as needed.  Marland Kitchen glucose blood (ONE TOUCH ULTRA  TEST) test strip USE ONE STRIP TO CHECK GLUCOSE ONCE DAILY  . metFORMIN (GLUCOPHAGE) 500 MG tablet TAKE 1 TABLET BY MOUTH TWICE DAILY WITH MEALS (LAST  TIME  FILLING)  . mometasone (NASONEX) 50 MCG/ACT nasal spray Place 2 sprays into the nose daily.  Marland Kitchen olmesartan (BENICAR) 20 MG tablet Take 1 tablet (20 mg total) by mouth daily.  Marland Kitchen omeprazole (PRILOSEC) 20 MG capsule Take 1 capsule (20 mg total) by mouth daily.   Current Facility-Administered Medications for the 11/23/18 encounter (Office Visit) with Duanne Limerick, MD  Medication  . carbamide peroxide (DEBROX) 6.5 % otic solution 5 drop    PHQ 2/9 Scores 10/01/2018 06/23/2017 05/10/2016  PHQ - 2 Score 0 0 0  PHQ- 9 Score 0 - -    Physical Exam Vitals signs and nursing note reviewed.  Constitutional:      General: She is not in acute distress.    Appearance: She is not diaphoretic.  HENT:     Head: Normocephalic and atraumatic.     Right Ear: Hearing, tympanic membrane, ear canal and external ear normal.     Left Ear: Hearing, tympanic membrane, ear canal and external ear normal.     Nose:     Right Turbinates: Swollen.     Left Turbinates: Swollen.     Right Sinus: Maxillary sinus tenderness present. No frontal sinus tenderness.     Left Sinus: Maxillary sinus tenderness present. No frontal sinus tenderness.     Mouth/Throat:     Pharynx: Oropharynx is clear.  Eyes:     General:        Right eye: No discharge.        Left eye: No discharge.     Conjunctiva/sclera: Conjunctivae normal.     Pupils: Pupils are equal, round, and reactive to light.  Neck:     Musculoskeletal: Normal range of motion and neck supple. No muscular tenderness.     Thyroid: No thyromegaly.     Vascular: No carotid bruit or JVD.  Cardiovascular:     Rate and Rhythm: Normal rate and regular rhythm.     Pulses: Normal pulses.     Heart sounds: Normal heart sounds. No murmur. No friction rub. No gallop.   Pulmonary:     Effort: Pulmonary effort is  normal.     Breath sounds: Wheezing present.  Abdominal:     General: Bowel sounds are normal.     Palpations: Abdomen is soft. There is no mass.     Tenderness: There is no abdominal tenderness. There is no guarding.  Musculoskeletal: Normal range of motion.  Lymphadenopathy:     Cervical: No cervical adenopathy.  Skin:    General: Skin is warm and dry.  Neurological:     Mental Status: She is alert.     Deep Tendon Reflexes: Reflexes are normal and symmetric.     BP 120/80   Pulse 80   Temp 98.3 F (36.8 C) (Oral)   Ht 5\' 5"  (1.651 m)   Wt 189 lb (85.7 kg)   SpO2 99%   BMI 31.45 kg/m   Assessment and Plan: 1. Acute maxillary sinusitis, recurrence not specified Acute. Prescribed Amox and pick up otc Robitussin DM. RTC as needed - amoxicillin (  AMOXIL) 500 MG capsule; Take 1 capsule (500 mg total) by mouth 3 (three) times daily.  Dispense: 30 capsule; Refill: 0

## 2019-01-17 ENCOUNTER — Other Ambulatory Visit: Payer: Self-pay

## 2019-01-17 ENCOUNTER — Ambulatory Visit (INDEPENDENT_AMBULATORY_CARE_PROVIDER_SITE_OTHER): Payer: Medicare Other | Admitting: Family Medicine

## 2019-01-17 ENCOUNTER — Encounter: Payer: Self-pay | Admitting: Family Medicine

## 2019-01-17 VITALS — BP 120/80 | HR 80 | Ht 65.0 in | Wt 185.0 lb

## 2019-01-17 DIAGNOSIS — J069 Acute upper respiratory infection, unspecified: Secondary | ICD-10-CM | POA: Diagnosis not present

## 2019-01-17 DIAGNOSIS — K5901 Slow transit constipation: Secondary | ICD-10-CM

## 2019-01-17 DIAGNOSIS — J40 Bronchitis, not specified as acute or chronic: Secondary | ICD-10-CM | POA: Diagnosis not present

## 2019-01-17 DIAGNOSIS — J3089 Other allergic rhinitis: Secondary | ICD-10-CM | POA: Diagnosis not present

## 2019-01-17 MED ORDER — DOCUSATE SODIUM 50 MG PO CAPS: 50.0000 mg | ORAL_CAPSULE | Freq: Two times a day (BID) | ORAL | 0 refills | Status: AC

## 2019-01-17 MED ORDER — MONTELUKAST SODIUM 10 MG PO TABS: 10.0000 mg | ORAL_TABLET | Freq: Every day | ORAL | 3 refills | Status: DC

## 2019-01-17 NOTE — Progress Notes (Signed)
Patient: [name], Female    DOB: 09-04-1932, 85 y.o.   MRN: [id number] Visit Date: 01/17/2019  Today's Provider: Elizabeth Sauer, MD   Chief Complaint  Patient presents with  . Cough    Cough with congestion. Started last week   Subjective:     ----------------------------------------------------------- Cough  This is a new problem. The current episode started in the past 7 days ("last week"). The problem has been unchanged. The problem occurs hourly. The cough is non-productive. Associated symptoms include nasal congestion, rhinorrhea, a sore throat and wheezing. Pertinent negatives include no chest pain, chills, ear congestion, ear pain, eye redness, fever, headaches, heartburn, hemoptysis, myalgias, postnasal drip, rash, shortness of breath, sweats or weight loss. Nothing aggravates the symptoms. Treatments tried: nasal steroid. The treatment provided mild relief. There is no history of asthma, COPD or environmental allergies.    Review of Systems  Constitutional: Negative.  Negative for chills, fatigue, fever, unexpected weight change and weight loss.  HENT: Positive for rhinorrhea and sore throat. Negative for congestion, ear discharge, ear pain, postnasal drip, sinus pressure and sneezing.   Eyes: Negative for photophobia, pain, discharge, redness and itching.  Respiratory: Positive for cough and wheezing. Negative for hemoptysis, shortness of breath and stridor.   Cardiovascular: Negative for chest pain.  Gastrointestinal: Negative for abdominal pain, blood in stool, constipation, diarrhea, heartburn, nausea and vomiting.  Endocrine: Negative for cold intolerance, heat intolerance, polydipsia, polyphagia and polyuria.  Genitourinary: Negative for dysuria, flank pain, frequency, hematuria, menstrual problem, pelvic pain, urgency, vaginal bleeding and vaginal discharge.  Musculoskeletal: Negative for arthralgias, back pain and myalgias.  Skin: Negative for rash.    Allergic/Immunologic: Negative for environmental allergies and food allergies.  Neurological: Negative for dizziness, weakness, light-headedness, numbness and headaches.  Hematological: Negative for adenopathy. Does not bruise/bleed easily.  Psychiatric/Behavioral: Negative for dysphoric mood. The patient is not nervous/anxious.     Social History   Socioeconomic History  . Marital status: Widowed    Spouse name: Not on file  . Number of children: Not on file  . Years of education: Not on file  . Highest education level: Not on file  Occupational History  . Not on file  Social Needs  . Financial resource strain: Not on file  . Food insecurity:    Worry: Not on file    Inability: Not on file  . Transportation needs:    Medical: Not on file    Non-medical: Not on file  Tobacco Use  . Smoking status: Never Smoker  . Smokeless tobacco: Never Used  Substance and Sexual Activity  . Alcohol use: No  . Drug use: No  . Sexual activity: Never  Lifestyle  . Physical activity:    Days per week: Not on file    Minutes per session: Not on file  . Stress: Not on file  Relationships  . Social connections:    Talks on phone: Not on file    Gets together: Not on file    Attends religious service: Not on file    Active member of club or organization: Not on file    Attends meetings of clubs or organizations: Not on file    Relationship status: Not on file  . Intimate partner violence:    Fear of current or ex partner: Not on file    Emotionally abused: Not on file    Physically abused: Not on file    Forced sexual activity: Not on file  Other Topics Concern  .  Not on file  Social History Narrative  . Not on file    Patient Active Problem List   Diagnosis Date Noted  . Sinus bradycardia 03/16/2018  . PSVT (paroxysmal supraventricular tachycardia) (HCC) 03/16/2018  . NSVT (nonsustained ventricular tachycardia) (HCC) 03/16/2018  . Malignant HTN with heart disease, w/o CHF, w/o  chronic kidney disease 02/05/2018  . Hyperlipidemia 09/24/2015  . Diabetes mellitus with no complication (HCC) 03/16/2015  . 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 Arthritis of knee, degenerative 03/16/2015  . Sinus infection 03/16/2015  . Diabetes (HCC)   . Essential hypertension   . GERD (gastroesophageal reflux disease)   . Status post total left knee replacement     Past Surgical History:  Procedure Laterality Date  . BACK SURGERY    . CATARACT EXTRACTION Bilateral 03/2016  . CERVICAL DISC SURGERY  12/28/2010   "bulging disc; went in on the left side of my neck" (08/19/2013)  . JOINT REPLACEMENT    . TOTAL KNEE ARTHROPLASTY Left 03/28/2011   Dr Thurston Hole  . TOTAL KNEE ARTHROPLASTY Right 08/19/2013  . TOTAL KNEE ARTHROPLASTY Right 08/19/2013   Procedure: TOTAL KNEE ARTHROPLASTY;  Surgeon: Nilda Simmer, MD;  Location: [facility] OR;  Service: Orthopedics;  Laterality: Right;    Her family history includes Cerebral aneurysm in her father; Diabetes in her mother.     Current Meds  Medication Sig  . acetaminophen (TYLENOL) 325 MG tablet Take 2 tablets (650 mg total) by mouth every 6 (six) hours as needed.  Marland Kitchen amLODipine (NORVASC) 5 MG tablet Take 1 tablet (5 mg total) by mouth daily.  Marland Kitchen aspirin 81 MG tablet Take 81 mg by mouth daily.  . cyanocobalamin 100 MCG tablet Take 1 tablet by mouth daily.   . diclofenac sodium (VOLTAREN) 1 % GEL Apply 4 g topically 4 (four) times daily as needed.  Marland Kitchen glucose blood (ONE TOUCH ULTRA TEST) test strip USE ONE STRIP TO CHECK GLUCOSE ONCE DAILY  . metFORMIN (GLUCOPHAGE) 500 MG tablet TAKE 1 TABLET BY MOUTH TWICE DAILY WITH MEALS (LAST  TIME  FILLING)  . mometasone (NASONEX) 50 MCG/ACT nasal spray Place 2 sprays into the nose daily.  Marland Kitchen olmesartan (BENICAR) 20 MG tablet Take 1 tablet (20 mg total) by mouth daily.  Marland Kitchen omeprazole (PRILOSEC) 20 MG capsule Take 1 capsule (20 mg total) by mouth daily.   Current Facility-Administered  Medications for the 01/17/19 encounter (Office Visit) with Duanne Limerick, MD  Medication  . carbamide peroxide (DEBROX) 6.5 % otic solution 5 drop    Patient Care Team: Duanne Limerick, MD as PCP - General (Family Medicine)       Objective:   Vitals: BP 120/80   Pulse 80   Ht 5\' 5"  (1.651 m)   Wt 185 lb (83.9 kg)   BMI 30.79 kg/m   Physical Exam Vitals signs and nursing note reviewed.  Constitutional:      General: She is not in acute distress.    Appearance: She is not diaphoretic.  HENT:     Head: Normocephalic and atraumatic.     Jaw: There is normal jaw occlusion.     Right Ear: Hearing, tympanic membrane, ear canal and external ear normal.     Left Ear: Hearing, tympanic membrane, ear canal and external ear normal.     Nose: Nose normal.     Right Turbinates: Swollen.     Left Turbinates: Swollen.     Mouth/Throat:     Lips: Pink.  Mouth: Mucous membranes are moist.     Pharynx: Oropharynx is clear. Uvula midline. No pharyngeal swelling, oropharyngeal exudate, posterior oropharyngeal erythema or uvula swelling.  Eyes:     General:        Right eye: No discharge.        Left eye: No discharge.     Conjunctiva/sclera: Conjunctivae normal.     Pupils: Pupils are equal, round, and reactive to light.  Neck:     Musculoskeletal: Normal range of motion and neck supple.     Thyroid: No thyromegaly.     Vascular: No JVD.  Cardiovascular:     Rate and Rhythm: Normal rate and regular rhythm.     Heart sounds: Normal heart sounds. No murmur. No friction rub. No gallop.   Pulmonary:     Effort: Pulmonary effort is normal. No respiratory distress.     Breath sounds: Normal breath sounds. No stridor, decreased air movement or transmitted upper airway sounds. No decreased breath sounds, wheezing, rhonchi or rales.  Chest:     Chest wall: No tenderness.  Abdominal:     General: Bowel sounds are normal.     Palpations: Abdomen is soft. There is no mass.     Tenderness:  There is no abdominal tenderness. There is no guarding.  Musculoskeletal: Normal range of motion.  Lymphadenopathy:     Cervical: No cervical adenopathy.  Skin:    General: Skin is warm and dry.  Neurological:     Mental Status: She is alert.     Deep Tendon Reflexes: Reflexes are normal and symmetric.     Activities of Daily Living In your present state of health, do you have any difficulty performing the following activities: 02/05/2018  Hearing? N  Vision? N  Difficulty concentrating or making decisions? N  Walking or climbing stairs? N  Dressing or bathing? N  Doing errands, shopping? Y  Some recent data might be hidden    Fall Risk Assessment Fall Risk  10/01/2018 06/23/2017 05/10/2016  Falls in the past year? 1 No No  Number falls in past yr: 0 - -  Injury with Fall? 0 - -  Risk for fall due to : Impaired balance/gait - -  Follow up Falls evaluation completed - -     Depression Screen PHQ 2/9 Scores 10/01/2018 06/23/2017 05/10/2016  PHQ - 2 Score 0 0 0  PHQ- 9 Score 0 - -    No flowsheet data found.  Medicare Annual Wellness Visit Summary:  Reviewed patient's Family Medical History Reviewed and updated list of patient's medical providers Assessment of cognitive impairment was done Assessed patient's functional ability Established a written schedule for health screening services Health Risk Assessent Completed and Reviewed  Exercise Activities and Dietary recommendations Goals   None     Immunization History  Administered Date(s) Administered  . Influenza, High Dose Seasonal PF 10/01/2018  . Pneumococcal Conjugate-13 09/24/2015  . Pneumococcal Polysaccharide-23 03/28/2013  . Pneumococcal-Unspecified 03/28/2013  . Tdap 05/10/2016    Health Maintenance  Topic Date Due  . OPHTHALMOLOGY EXAM  01/17/2019 (Originally 08/28/2018)  . DEXA SCAN  10/02/2019 (Originally 01/18/1999)  . HEMOGLOBIN A1C  04/01/2019  . FOOT EXAM  10/02/2019  . TETANUS/TDAP   05/10/2026  . INFLUENZA VACCINE  Completed  . PNA vac Low Risk Adult  Completed    Discussed health benefits of physical activity, and encouraged her to engage in regular exercise appropriate for her age and condition.    ------------------------------------------------------------------------------------------------------------  Assessment &  Plan:    1. Bronchitis Patient has a nonproductive cough.  This is likely an acute bronchitis may be viral in origin as there has been some wheezing at night as well as upper respiratory congestion we will use Singulair 10 mg once a day.  2. Non-seasonal allergic rhinitis, unspecified trigger And has allergic rhinitis history that is not taking care of by current over-the-counter.  Will initiate Singulair 10 mg daily. - montelukast (SINGULAIR) 10 MG tablet; Take 1 tablet (10 mg total) by mouth at bedtime.  Dispense: 30 tablet; Refill: 3  3. Viral upper respiratory tract infection Viral respiratory tract infection which patient was told would be transient in nature.  4. Slow transit constipation Has some episodes of constipation and we will dial Colace 50 mg daily.

## 2019-01-20 ENCOUNTER — Other Ambulatory Visit: Payer: Self-pay | Admitting: Family Medicine

## 2019-01-20 DIAGNOSIS — E119 Type 2 diabetes mellitus without complications: Secondary | ICD-10-CM

## 2019-01-31 ENCOUNTER — Telehealth: Payer: Self-pay | Admitting: Family Medicine

## 2019-01-31 NOTE — Telephone Encounter (Signed)
Returned call showing a missed call on my J[employer]ber.  No message was left.  Patient didn't answer.  Left message again to call Misty Stanley at [phone]. Called to schedule Medicare Annual Wellness Visit with the Nurse Health Advisor.   If patient returns call, please schedule AWV-I with [facility] any date available.  Patient is due now.  Thank you! For any questions please contact: Trixie Rude at [phone] or Skype [email or web address]

## 2019-01-31 NOTE — Telephone Encounter (Signed)
Patient returned my call about scheduling AWV-I.  Patient states will need to get with her daughter to see when she can bring her.  [name] states she will call back to schedule.  Trixie Rude, Care Guide.

## 2019-02-25 ENCOUNTER — Encounter: Payer: Self-pay | Admitting: Family Medicine

## 2019-02-25 ENCOUNTER — Ambulatory Visit (INDEPENDENT_AMBULATORY_CARE_PROVIDER_SITE_OTHER): Payer: Medicare Other | Admitting: Family Medicine

## 2019-02-25 ENCOUNTER — Other Ambulatory Visit: Payer: Self-pay

## 2019-02-25 VITALS — BP 120/70 | HR 62 | Ht 65.0 in | Wt 183.0 lb

## 2019-02-25 DIAGNOSIS — H6123 Impacted cerumen, bilateral: Secondary | ICD-10-CM

## 2019-02-25 MED ORDER — CARBAMIDE PEROXIDE 6.5 % OT SOLN: 5.0000 [drp] | Freq: Two times a day (BID) | OTIC | 0 refills | Status: DC

## 2019-02-25 NOTE — Patient Instructions (Addendum)
Earwax Buildup, Adult The ears produce a substance called earwax that helps keep bacteria out of the ear and protects the skin in the ear canal. Occasionally, earwax can build up in the ear and cause discomfort or hearing loss. What increases the risk? This condition is more likely to develop in people who:  Are female.  Are elderly.  Naturally produce more earwax.  Clean their ears often with cotton swabs.  Use earplugs often.  Use in-ear headphones often.  Wear hearing aids.  Have narrow ear canals.  Have earwax that is overly thick or sticky.  Have eczema.  Are dehydrated.  Have excess hair in the ear canal. What are the signs or symptoms? Symptoms of this condition include:  Reduced or muffled hearing.  A feeling of fullness in the ear or feeling that the ear is plugged.  Fluid coming from the ear.  Ear pain.  Ear itch.  Ringing in the ear.  Coughing.  An obvious piece of earwax that can be seen inside the ear canal. How is this diagnosed? This condition may be diagnosed based on:  Your symptoms.  Your medical history.  An ear exam. During the exam, your health care provider will look into your ear with an instrument called an otoscope. You may have tests, including a hearing test. How is this treated? This condition may be treated by: Using ear This information is directly available on the CDC website: DiscoHelp.si.html    Source:CDC Reference to specific commercial products, manufacturers, companies, or trademarks does not constitute its endorsement or recommendation by the U.S. Government, Department of Health and [employer], or Centers for [employer] and Prevention.  drops to soften the earwax.  Having the earwax removed by a health care provider. The health care provider may: ? Flush the ear with water. ? Use an instrument that has a loop on the end (curette). ? Use a  suction device.  Surgery to remove the wax buildup. This may be done in severe cases. Follow these instructions at home:   Take over-the-counter and prescription medicines only as told by your health care provider.  Do not put any objects, including cotton swabs, into your ear. You can clean the opening of your ear canal with a washcloth or facial tissue.  Follow instructions from your health care provider about cleaning your ears. Do not over-clean your ears.  Drink enough fluid to keep your urine clear or pale yellow. This will help to thin the earwax.  Keep all follow-up visits as told by your health care provider. If earwax builds up in your ears often or if you use hearing aids, consider seeing your health care provider for routine, preventive ear cleanings. Ask your health care provider how often you should schedule your cleanings.  If you have hearing aids, clean them according to instructions from the manufacturer and your health care provider. Contact a health care provider if:  You have ear pain.  You develop a fever.  You have blood, pus, or other fluid coming from your ear.  You have hearing loss.  You have ringing in your ears that does not go away.  Your symptoms do not improve with treatment.  You feel like the room is spinning (vertigo). Summary  Earwax can build up in the ear and cause discomfort or hearing loss.  The most common symptoms of this condition include reduced or muffled hearing and a feeling of fullness in the ear or feeling that the ear is plugged.  This condition may be diagnosed based on your symptoms, your medical history, and an ear exam.  This condition may be treated by using ear drops to soften the earwax or by having the earwax removed by a health care provider.  Do not put any objects, including cotton swabs, into your ear. You can clean the opening of your ear canal with a washcloth or facial tissue. This information is not intended  to replace advice given to you by your health care provider. Make sure you discuss any questions you have with your health care provider. Document Released: 12/15/2004 Document Revised: 10/19/2017 Document Reviewed: 01/18/2017 Elsevier Interactive Patient Education  2019 [employer].

## 2019-02-25 NOTE — Progress Notes (Signed)
Date:  02/25/2019   Name:  [name]   DOB:  01/21/1932   MRN:  [id number]   Chief Complaint: Ear Problem (L) ear "feels full')  Ear Fullness   There is pain in the left ear. This is a new problem. The current episode started yesterday (over the weekend). The problem occurs constantly. The problem has been gradually worsening. There has been no fever. The pain is at a severity of 0/10. The patient is experiencing no pain. Pertinent negatives include no abdominal pain, coughing, diarrhea, ear discharge, headaches, hearing loss, neck pain, rash, rhinorrhea, sore throat or vomiting. She has tried nothing for the symptoms. The treatment provided moderate relief. Her past medical history is significant for hearing loss.    Review of Systems  Constitutional: Negative.  Negative for chills, fatigue, fever and unexpected weight change.  HENT: Negative for congestion, ear discharge, ear pain, hearing loss, rhinorrhea, sinus pressure, sneezing and sore throat.   Eyes: Negative for photophobia, pain, discharge, redness and itching.  Respiratory: Negative for cough, shortness of breath, wheezing and stridor.   Gastrointestinal: Negative for abdominal pain, blood in stool, constipation, diarrhea, nausea and vomiting.  Endocrine: Negative for cold intolerance, heat intolerance, polydipsia, polyphagia and polyuria.  Genitourinary: Negative for dysuria, flank pain, frequency, hematuria, menstrual problem, pelvic pain, urgency, vaginal bleeding and vaginal discharge.  Musculoskeletal: Negative for arthralgias, back pain, myalgias and neck pain.  Skin: Negative for rash.  Allergic/Immunologic: Negative for environmental allergies and food allergies.  Neurological: Negative for dizziness, weakness, light-headedness, numbness and headaches.  Hematological: Negative for adenopathy. Does not bruise/bleed easily.  Psychiatric/Behavioral: Negative for dysphoric mood. The patient is not nervous/anxious.     Patient Active Problem List   Diagnosis Date Noted  . Sinus bradycardia 03/16/2018  . PSVT (paroxysmal supraventricular tachycardia) (HCC) 03/16/2018  . NSVT (nonsustained ventricular tachycardia) (HCC) 03/16/2018  . 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 Hyperlipidemia 09/24/2015  . Diabetes mellitus with no complication (HCC) 03/16/2015  . 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 Arthritis of knee, degenerative 03/16/2015  . Sinus infection 03/16/2015  . Diabetes (HCC)   . Essential hypertension   . GERD (gastroesophageal reflux disease)   . Status post total left knee replacement     Allergies  Allergen Reactions  . Nsaids   . Lisinopril     Cough    Past Surgical History:  Procedure Laterality Date  . BACK SURGERY    . CATARACT EXTRACTION Bilateral 03/2016  . CERVICAL DISC SURGERY  12/28/2010   "bulging disc; went in on the left side of my neck" (08/19/2013)  . JOINT REPLACEMENT    . TOTAL KNEE ARTHROPLASTY Left 03/28/2011   Dr Thurston Hole  . TOTAL KNEE ARTHROPLASTY Right 08/19/2013  . TOTAL KNEE ARTHROPLASTY Right 08/19/2013   Procedure: TOTAL KNEE ARTHROPLASTY;  Surgeon: Nilda Simmer, MD;  Location: [facility] OR;  Service: Orthopedics;  Laterality: Right;    Social History   Tobacco Use  . Smoking status: Never Smoker  . Smokeless tobacco: Never Used  Substance Use Topics  . Alcohol use: No  . Drug use: No     Medication list has been reviewed and updated.  Current Meds  Medication Sig  . acetaminophen (TYLENOL) 325 MG tablet Take 2 tablets (650 mg total) by mouth every 6 (six) hours as needed.  Marland Kitchen amLODipine (NORVASC) 5 MG tablet Take 1 tablet (5 mg total) by mouth daily.  Marland Kitchen  aspirin 81 MG tablet Take 81 mg by mouth daily.  Marland Kitchen docusate sodium (COLACE) 50 MG capsule Take 1 capsule (50 mg total) by mouth 2 (two) times daily.  Marland Kitchen glucose blood (ONE TOUCH ULTRA TEST) test strip USE 1 STRIP TO CHECK GLUCOSE  ONCE DAILY  . metFORMIN (GLUCOPHAGE) 500 MG tablet TAKE 1 TABLET BY MOUTH TWICE DAILY WITH MEALS (LAST  TIME  FILLING)  . mometasone (NASONEX) 50 MCG/ACT nasal spray Place 2 sprays into the nose daily.  . montelukast (SINGULAIR) 10 MG tablet Take 1 tablet (10 mg total) by mouth at bedtime.  Marland Kitchen olmesartan (BENICAR) 20 MG tablet Take 1 tablet (20 mg total) by mouth daily.   Current Facility-Administered Medications for the 02/25/19 encounter (Office Visit) with Duanne Limerick, MD  Medication  . carbamide peroxide (DEBROX) 6.5 % otic solution 5 drop    PHQ 2/9 Scores 10/01/2018 06/23/2017 05/10/2016  PHQ - 2 Score 0 0 0  PHQ- 9 Score 0 - -    BP Readings from Last 3 Encounters:  02/25/19 (!) 178/82  01/17/19 120/80  11/23/18 120/80    Physical Exam Vitals signs and nursing note reviewed.  Constitutional:      General: She is not in acute distress.    Appearance: She is not diaphoretic.  HENT:     Head: Normocephalic and atraumatic.     Right Ear: Tympanic membrane, ear canal and external ear normal. There is impacted cerumen.     Left Ear: Tympanic membrane, ear canal and external ear normal. There is impacted cerumen.     Nose: Nose normal.  Eyes:     General:        Right eye: No discharge.        Left eye: No discharge.     Conjunctiva/sclera: Conjunctivae normal.     Pupils: Pupils are equal, round, and reactive to light.  Neck:     Musculoskeletal: Normal range of motion and neck supple.     Thyroid: No thyromegaly.     Vascular: No JVD.  Cardiovascular:     Rate and Rhythm: Normal rate and regular rhythm.     Pulses: Normal pulses.     Heart sounds: Normal heart sounds. No murmur. No friction rub. No gallop.   Pulmonary:     Effort: Pulmonary effort is normal.     Breath sounds: Normal breath sounds.  Abdominal:     General: Bowel sounds are normal.     Palpations: Abdomen is soft. There is no mass.     Tenderness: There is no abdominal tenderness. There is no  guarding.  Musculoskeletal: Normal range of motion.  Lymphadenopathy:     Cervical: No cervical adenopathy.  Skin:    General: Skin is warm and dry.  Neurological:     Mental Status: She is alert.     Deep Tendon Reflexes: Reflexes are normal and symmetric.     Wt Readings from Last 3 Encounters:  02/25/19 183 lb (83 kg)  01/17/19 185 lb (83.9 kg)  11/23/18 189 lb (85.7 kg)    BP (!) 178/82   Pulse 62   Ht 5\' 5"  (1.651 m)   Wt 183 lb (83 kg)   SpO2 97%   BMI 30.45 kg/m   Assessment and Plan: 1. Bilateral impacted cerumen Patient came in particularly concerned because of rather sudden decrease in hearing and inability to clear a sensation of fullness in the left ear.  Been unable to use her Debrox  because she was not aware that she had the product there to be picked up.  Patient underwent irrigation of the left ear with removal of a large amount of cerumen and a immediate relief of fullness.  Patient has a smaller amount of cerumen in her right ear for which we have prescribed Debrox and encouraged her to use a bulb syringe for clearance of this.  Patient also been instructed not to use any more Q-tips. - carbamide peroxide (DEBROX) 6.5 % OTIC solution; Place 5 drops into both ears 2 (two) times daily.  Dispense: 15 mL; Refill: 0  2.Hypertension.  Patient was noted to have an elevated blood pressure.  Patient will be called and reinforced to take her medication and to avoid sodium.  Patient apparently did walk all the way from the parking lot to the clinic which may have contributed to the blood pressure elevation.  Patient will continue her amlodipine and Benicar the meantime.  Delice Bison will call to set up a recheck appointment.

## 2019-03-01 ENCOUNTER — Ambulatory Visit: Payer: Medicare Other

## 2019-03-01 ENCOUNTER — Other Ambulatory Visit: Payer: Self-pay

## 2019-03-01 NOTE — Progress Notes (Signed)
Rechecked b/p from high reading

## 2019-05-17 ENCOUNTER — Ambulatory Visit (INDEPENDENT_AMBULATORY_CARE_PROVIDER_SITE_OTHER): Payer: Medicare Other | Admitting: Family Medicine

## 2019-05-17 ENCOUNTER — Encounter: Payer: Self-pay | Admitting: Family Medicine

## 2019-05-17 ENCOUNTER — Other Ambulatory Visit: Payer: Self-pay

## 2019-05-17 VITALS — BP 120/70 | HR 68 | Ht 65.0 in | Wt 184.0 lb

## 2019-05-17 DIAGNOSIS — J3089 Other allergic rhinitis: Secondary | ICD-10-CM | POA: Diagnosis not present

## 2019-05-17 DIAGNOSIS — K219 Gastro-esophageal reflux disease without esophagitis: Secondary | ICD-10-CM | POA: Diagnosis not present

## 2019-05-17 DIAGNOSIS — J301 Allergic rhinitis due to pollen: Secondary | ICD-10-CM

## 2019-05-17 DIAGNOSIS — I10 Essential (primary) hypertension: Secondary | ICD-10-CM

## 2019-05-17 DIAGNOSIS — G8929 Other chronic pain: Secondary | ICD-10-CM

## 2019-05-17 DIAGNOSIS — E119 Type 2 diabetes mellitus without complications: Secondary | ICD-10-CM

## 2019-05-17 DIAGNOSIS — M25562 Pain in left knee: Secondary | ICD-10-CM

## 2019-05-17 MED ORDER — MONTELUKAST SODIUM 10 MG PO TABS: 10.0000 mg | ORAL_TABLET | Freq: Every day | ORAL | 3 refills | Status: DC

## 2019-05-17 MED ORDER — MOMETASONE FUROATE 50 MCG/ACT NA SUSP: 2.0000 | Freq: Every day | NASAL | 12 refills | Status: DC

## 2019-05-17 MED ORDER — OLMESARTAN MEDOXOMIL 20 MG PO TABS: 20.0000 mg | ORAL_TABLET | Freq: Every day | ORAL | 1 refills | Status: DC

## 2019-05-17 MED ORDER — AMLODIPINE BESYLATE 5 MG PO TABS: 5.0000 mg | ORAL_TABLET | Freq: Every day | ORAL | 1 refills | Status: DC

## 2019-05-17 MED ORDER — OMEPRAZOLE 20 MG PO CPDR: 20.0000 mg | DELAYED_RELEASE_CAPSULE | Freq: Every day | ORAL | 5 refills | Status: DC

## 2019-05-17 MED ORDER — GLUCOSE BLOOD VI STRP: ORAL_STRIP | 0 refills | Status: DC

## 2019-05-17 MED ORDER — METFORMIN HCL 500 MG PO TABS: ORAL_TABLET | ORAL | 1 refills | Status: DC

## 2019-05-17 NOTE — Progress Notes (Signed)
Date:  05/17/2019   Name:  [name]   DOB:  1932-05-12   MRN:  [id number]   Chief Complaint: Hypertension, Gastroesophageal Reflux, Diabetes, and Allergic Rhinitis   Hypertension This is a chronic problem. The current episode started more than 1 year ago. The problem has been waxing and waning since onset. The problem is controlled. Pertinent negatives include no anxiety, blurred vision, chest pain, headaches, malaise/fatigue, neck pain, orthopnea, palpitations, peripheral edema, PND, shortness of breath or sweats. There are no associated agents to hypertension. There are no known risk factors for coronary artery disease. Past treatments include angiotensin blockers and calcium channel blockers. The current treatment provides moderate improvement. There are no compliance problems.  There is no history of angina, kidney disease, CAD/MI, CVA, heart failure, left ventricular hypertrophy, PVD or retinopathy. There is no history of chronic renal disease, a hypertension causing med or renovascular disease.  Gastroesophageal Reflux She reports no abdominal pain, no belching, no chest pain, no choking, no coughing, no dysphagia, no early satiety, no heartburn, no hoarse voice, no nausea, no sore throat, no stridor or no wheezing. This is a chronic problem. Nothing aggravates the symptoms. Pertinent negatives include no anemia, fatigue, melena, muscle weakness, orthopnea or weight loss. She has tried a PPI for the symptoms. The treatment provided moderate relief.  Diabetes She presents for her follow-up diabetic visit. She has type 2 diabetes mellitus. Her disease course has been stable. There are no hypoglycemic associated symptoms. Pertinent negatives for hypoglycemia include no confusion, dizziness, headaches, hunger, mood changes, nervousness/anxiousness, pallor, seizures, sleepiness, speech difficulty, sweats or tremors. Pertinent negatives for diabetes include no blurred vision, no chest pain, no  fatigue, no foot paresthesias, no foot ulcerations, no polydipsia, no polyphagia, no polyuria, no visual change, no weakness and no weight loss. There are no hypoglycemic complications. Symptoms are stable. There are no diabetic complications. Pertinent negatives for diabetic complications include no autonomic neuropathy, CVA, heart disease, impotence, nephropathy, peripheral neuropathy, PVD or retinopathy. There are no known risk factors for coronary artery disease. Current diabetic treatment includes oral agent (monotherapy). She is compliant with treatment all of the time. Her weight is stable. She is following a generally healthy diet. Meal planning includes avoidance of concentrated sweets and carbohydrate counting. There is no change in her home blood glucose trend. Her breakfast blood glucose is taken between 8-9 am. Her breakfast blood glucose range is generally 130-140 mg/dl. An ACE inhibitor/angiotensin II receptor blocker is being taken.  Knee Pain  Incident onset: ever since surgery. There was no injury mechanism. The pain is present in the left knee. The pain has been constant since onset. The treatment provided moderate relief.    Review of Systems  Constitutional: Negative for chills, fatigue, fever, malaise/fatigue and weight loss.  HENT: Negative for drooling, ear discharge, ear pain, hoarse voice and sore throat.   Eyes: Negative for blurred vision.  Respiratory: Negative for cough, choking, shortness of breath and wheezing.   Cardiovascular: Negative for chest pain, palpitations, orthopnea, leg swelling and PND.  Gastrointestinal: Negative for abdominal pain, blood in stool, constipation, diarrhea, dysphagia, heartburn, melena and nausea.  Endocrine: Negative for polydipsia, polyphagia and polyuria.  Genitourinary: Negative for dysuria, frequency, hematuria, impotence and urgency.  Musculoskeletal: Negative for back pain, myalgias, muscle weakness and neck pain.  Skin: Negative for  pallor and rash.  Allergic/Immunologic: Negative for environmental allergies.  Neurological: Negative for dizziness, tremors, seizures, speech difficulty, weakness and headaches.  Hematological: Does not  bruise/bleed easily.  Psychiatric/Behavioral: Negative for confusion and suicidal ideas. The patient is not nervous/anxious.     Patient Active Problem List   Diagnosis Date Noted  . Sinus bradycardia 03/16/2018  . PSVT (paroxysmal supraventricular tachycardia) (HCC) 03/16/2018  . NSVT (nonsustained ventricular tachycardia) (HCC) 03/16/2018  . 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 Hyperlipidemia 09/24/2015  . Diabetes mellitus with no complication (HCC) 03/16/2015  . 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 Arthritis of knee, degenerative 03/16/2015  . Sinus infection 03/16/2015  . Diabetes (HCC)   . Essential hypertension   . GERD (gastroesophageal reflux disease)   . Status post total left knee replacement     Allergies  Allergen Reactions  . Nsaids   . Lisinopril     Cough    Past Surgical History:  Procedure Laterality Date  . BACK SURGERY    . CATARACT EXTRACTION Bilateral 03/2016  . CERVICAL DISC SURGERY  12/28/2010   "bulging disc; went in on the left side of my neck" (08/19/2013)  . JOINT REPLACEMENT    . TOTAL KNEE ARTHROPLASTY Left 03/28/2011   Dr Thurston HoleWainer  . TOTAL KNEE ARTHROPLASTY Right 08/19/2013  . TOTAL KNEE ARTHROPLASTY Right 08/19/2013   Procedure: TOTAL KNEE ARTHROPLASTY;  Surgeon: Nilda Simmerobert A Wainer, MD;  Location: [facility] OR;  Service: Orthopedics;  Laterality: Right;    Social History   Tobacco Use  . Smoking status: Never Smoker  . Smokeless tobacco: Never Used  Substance Use Topics  . Alcohol use: No  . Drug use: No     Medication list has been reviewed and updated.  Current Meds  Medication Sig  . acetaminophen (TYLENOL) 325 MG tablet Take 2 tablets (650 mg total) by mouth every 6  (six) hours as needed.  Marland Kitchen. amLODipine (NORVASC) 5 MG tablet Take 1 tablet (5 mg total) by mouth daily.  Marland Kitchen. aspirin 81 MG tablet Take 81 mg by mouth daily.  . carbamide peroxide (DEBROX) 6.5 % OTIC solution Place 5 drops into both ears 2 (two) times daily.  . cyanocobalamin 100 MCG tablet Take 1 tablet by mouth daily.   Marland Kitchen. glucose blood (ONE TOUCH ULTRA TEST) test strip USE 1 STRIP TO CHECK GLUCOSE ONCE DAILY  . metFORMIN (GLUCOPHAGE) 500 MG tablet TAKE 1 TABLET BY MOUTH TWICE DAILY WITH MEALS (LAST  TIME  FILLING)  . mometasone (NASONEX) 50 MCG/ACT nasal spray Place 2 sprays into the nose daily.  . montelukast (SINGULAIR) 10 MG tablet Take 1 tablet (10 mg total) by mouth at bedtime.  Marland Kitchen. olmesartan (BENICAR) 20 MG tablet Take 1 tablet (20 mg total) by mouth daily.  Marland Kitchen. omeprazole (PRILOSEC) 20 MG capsule Take 1 capsule (20 mg total) by mouth daily.    PHQ 2/9 Scores 05/17/2019 10/01/2018 06/23/2017 05/10/2016  PHQ - 2 Score 0 0 0 0  PHQ- 9 Score 0 0 - -    BP Readings from Last 3 Encounters:  05/17/19 120/70  03/01/19 120/70  01/17/19 120/80    Physical Exam Vitals signs and nursing note reviewed.  Constitutional:      Appearance: She is well-developed.  HENT:     Head: Normocephalic.     Right Ear: External ear normal.     Left Ear: External ear normal.     Nose: Nose normal.  Eyes:     General: Lids are everted, no foreign bodies appreciated. No scleral icterus.       Left eye: No foreign  body or hordeolum.     Conjunctiva/sclera: Conjunctivae normal.     Right eye: Right conjunctiva is not injected.     Left eye: Left conjunctiva is not injected.     Pupils: Pupils are equal, round, and reactive to light.  Neck:     Musculoskeletal: Normal range of motion and neck supple.     Thyroid: No thyromegaly.     Vascular: No JVD.     Trachea: No tracheal deviation.  Cardiovascular:     Rate and Rhythm: Normal rate and regular rhythm.     Heart sounds: Normal heart sounds. No murmur. No  friction rub. No gallop.   Pulmonary:     Effort: Pulmonary effort is normal. No respiratory distress.     Breath sounds: Normal breath sounds. No wheezing or rales.  Abdominal:     General: Bowel sounds are normal.     Palpations: Abdomen is soft. There is no mass.     Tenderness: There is no abdominal tenderness. There is no guarding or rebound.  Musculoskeletal: Normal range of motion.     Left knee: Tenderness found. Medial joint line and lateral joint line tenderness noted.  Lymphadenopathy:     Cervical: No cervical adenopathy.  Skin:    General: Skin is warm.     Findings: No rash.  Neurological:     Mental Status: She is alert and oriented to person, place, and time.     Cranial Nerves: No cranial nerve deficit.     Deep Tendon Reflexes: Reflexes normal.  Psychiatric:        Mood and Affect: Mood is not anxious or depressed.     Wt Readings from Last 3 Encounters:  05/17/19 184 lb (83.5 kg)  02/25/19 183 lb (83 kg)  01/17/19 185 lb (83.9 kg)    BP 120/70   Pulse 68   Ht 5\' 5"  (1.651 m)   Wt 184 lb (83.5 kg)   BMI 30.62 kg/m   Assessment and Plan:  1. Essential hypertension Chronic.  Controlled.  Continue amlodipine 5 mg and olmesartan 20 mg once a day - Renal Function Panel - amLODipine (NORVASC) 5 MG tablet; Take 1 tablet (5 mg total) by mouth daily.  Dispense: 90 tablet; Refill: 1 - olmesartan (BENICAR) 20 MG tablet; Take 1 tablet (20 mg total) by mouth daily.  Dispense: 90 tablet; Refill: 1 - Ambulatory referral to Orthopedic Surgery  2. Gastroesophageal reflux disease, esophagitis presence not specified Chronic.  Controlled.  Continue Prilosec 20 mg once a day. - omeprazole (PRILOSEC) 20 MG capsule; Take 1 capsule (20 mg total) by mouth daily.  Dispense: 30 capsule; Refill: 5  3. Non-seasonal allergic rhinitis, unspecified trigger Current.  Continue Singulair 10 mg once a day. - montelukast (SINGULAIR) 10 MG tablet; Take 1 tablet (10 mg total) by mouth  at bedtime.  Dispense: 30 tablet; Refill: 3  4. Acute seasonal allergic rhinitis due to pollen Recurrent.  Controlled.  Continue Nasonex 2 sprays in each nostril once a day. - mometasone (NASONEX) 50 MCG/ACT nasal spray; Place 2 sprays into the nose daily.  Dispense: 17 g; Refill: 12  5. Type 2 diabetes mellitus without complication, without long-term current use of insulin (HCC) Chronic.  Controlled.  Continue metformin 500 mg twice a day.  Will check A1c and renal function panel today. - HgB A1c - Renal Function Panel - metFORMIN (GLUCOPHAGE) 500 MG tablet; TAKE 1 TABLET BY MOUTH TWICE DAILY WITH MEALS (LAST  TIME  FILLING)  Dispense: 180 tablet; Refill: 1 - glucose blood (ONE TOUCH ULTRA TEST) test strip; USE 1 STRIP TO CHECK GLUCOSE ONCE DAILY  Dispense: 50 each; Refill: 0  6. Chronic pain of left knee Chronic.  Status post surgery to left knee in 2014.  Will refer to Dr. Elsie Saas [facility]qu[facility] and [facility] orth[facility] for reevaluation.

## 2019-05-18 LAB — RENAL FUNCTION PANEL
Albumin: 4 g/dL (ref 3.6–4.6)
BUN/Creatinine Ratio: 21 (ref 12–28)
BUN: 15 mg/dL (ref 8–27)
CO2: 21 mmol/L (ref 20–29)
Calcium: 10 mg/dL (ref 8.7–10.3)
Chloride: 103 mmol/L (ref 96–106)
Creatinine, Ser: 0.71 mg/dL (ref 0.57–1.00)
GFR calc Af Amer: 90 mL/min/{1.73_m2} (ref >59)
GFR calc non Af Amer: 78 mL/min/{1.73_m2} (ref >59)
Glucose: 104 mg/dL — ABNORMAL HIGH (ref 65–99)
Phosphorus: 4 mg/dL (ref 3.0–4.3)
Potassium: 5 mmol/L (ref 3.5–5.2)
Sodium: 139 mmol/L (ref 134–144)

## 2019-05-18 LAB — HEMOGLOBIN A1C
Est. average glucose Bld gHb Est-mCnc: 148 mg/dL
Hgb A1c MFr Bld: 6.8 % — ABNORMAL HIGH (ref 4.8–5.6)

## 2019-05-27 DIAGNOSIS — M545 Low back pain: Secondary | ICD-10-CM | POA: Diagnosis not present

## 2019-05-27 DIAGNOSIS — M1612 Unilateral primary osteoarthritis, left hip: Secondary | ICD-10-CM | POA: Diagnosis not present

## 2019-05-27 DIAGNOSIS — M25562 Pain in left knee: Secondary | ICD-10-CM | POA: Diagnosis not present

## 2019-05-31 DIAGNOSIS — M545 Low back pain: Secondary | ICD-10-CM | POA: Diagnosis not present

## 2019-06-11 DIAGNOSIS — M25562 Pain in left knee: Secondary | ICD-10-CM | POA: Diagnosis not present

## 2019-06-11 DIAGNOSIS — M545 Low back pain: Secondary | ICD-10-CM | POA: Diagnosis not present

## 2019-06-13 DIAGNOSIS — M5416 Radiculopathy, lumbar region: Secondary | ICD-10-CM | POA: Diagnosis not present

## 2019-06-13 DIAGNOSIS — M545 Low back pain: Secondary | ICD-10-CM | POA: Diagnosis not present

## 2019-06-26 DIAGNOSIS — E113393 Type 2 diabetes mellitus with moderate nonproliferative diabetic retinopathy without macular edema, bilateral: Secondary | ICD-10-CM | POA: Diagnosis not present

## 2019-07-02 DIAGNOSIS — M545 Low back pain: Secondary | ICD-10-CM | POA: Diagnosis not present

## 2019-07-02 DIAGNOSIS — M5416 Radiculopathy, lumbar region: Secondary | ICD-10-CM | POA: Diagnosis not present

## 2019-07-02 DIAGNOSIS — M47816 Spondylosis without myelopathy or radiculopathy, lumbar region: Secondary | ICD-10-CM | POA: Diagnosis not present

## 2019-07-05 DIAGNOSIS — H26491 Other secondary cataract, right eye: Secondary | ICD-10-CM | POA: Diagnosis not present

## 2019-07-05 DIAGNOSIS — Z961 Presence of intraocular lens: Secondary | ICD-10-CM | POA: Diagnosis not present

## 2019-07-05 DIAGNOSIS — H26492 Other secondary cataract, left eye: Secondary | ICD-10-CM | POA: Diagnosis not present

## 2019-07-05 DIAGNOSIS — H18413 Arcus senilis, bilateral: Secondary | ICD-10-CM | POA: Diagnosis not present

## 2019-07-22 DIAGNOSIS — M47816 Spondylosis without myelopathy or radiculopathy, lumbar region: Secondary | ICD-10-CM | POA: Diagnosis not present

## 2019-07-22 DIAGNOSIS — M545 Low back pain: Secondary | ICD-10-CM | POA: Diagnosis not present

## 2019-07-23 ENCOUNTER — Ambulatory Visit (INDEPENDENT_AMBULATORY_CARE_PROVIDER_SITE_OTHER): Payer: Medicare Other | Admitting: Family Medicine

## 2019-07-23 ENCOUNTER — Other Ambulatory Visit: Payer: Self-pay

## 2019-07-23 ENCOUNTER — Encounter: Payer: Self-pay | Admitting: Family Medicine

## 2019-07-23 VITALS — BP 142/80 | Ht 65.0 in | Wt 190.0 lb

## 2019-07-23 DIAGNOSIS — L821 Other seborrheic keratosis: Secondary | ICD-10-CM

## 2019-07-23 DIAGNOSIS — Z23 Encounter for immunization: Secondary | ICD-10-CM | POA: Diagnosis not present

## 2019-07-23 DIAGNOSIS — I10 Essential (primary) hypertension: Secondary | ICD-10-CM

## 2019-07-23 DIAGNOSIS — E7801 Familial hypercholesterolemia: Secondary | ICD-10-CM

## 2019-07-23 MED ORDER — AMLODIPINE BESYLATE 5 MG PO TABS: 5.0000 mg | ORAL_TABLET | Freq: Every day | ORAL | 1 refills | Status: DC

## 2019-07-23 MED ORDER — OLMESARTAN MEDOXOMIL 20 MG PO TABS: 20.0000 mg | ORAL_TABLET | Freq: Every day | ORAL | 1 refills | Status: DC

## 2019-07-23 NOTE — Progress Notes (Signed)
Date:  07/23/2019   Name:  [name]   DOB:  03-07-1932   MRN:  [id number]   Chief Complaint: Hypertension (b/p was 204/108 at appt) and Rash (discoloration started after knee surgery)  Hypertension This is a chronic problem. The current episode started more than 1 year ago. The problem has been waxing and waning since onset. The problem is controlled. Pertinent negatives include no anxiety, blurred vision, chest pain, headaches, malaise/fatigue, neck pain, orthopnea, palpitations, peripheral edema, PND, shortness of breath or sweats. There are no associated agents to hypertension. Risk factors for coronary artery disease include dyslipidemia. There is no history of angina, kidney disease, CAD/MI, CVA, heart failure, left ventricular hypertrophy, PVD or retinopathy. There is no history of chronic renal disease, a hypertension causing med or renovascular disease.  Rash This is a new problem. The current episode started more than 1 year ago (8 years). The problem is unchanged. The affected locations include the left lower leg and right lower leg. Rash characteristics: macular pigmented. She was exposed to nothing. Pertinent negatives include no anorexia, congestion, cough, diarrhea, eye pain, fatigue, fever, rhinorrhea, shortness of breath, sore throat or vomiting. The treatment provided moderate relief.  Hyperlipidemia She has no history of chronic renal disease. Pertinent negatives include no chest pain, myalgias or shortness of breath.    Review of Systems  Constitutional: Negative.  Negative for chills, fatigue, fever, malaise/fatigue and unexpected weight change.  HENT: Negative for congestion, ear discharge, ear pain, rhinorrhea, sinus pressure, sneezing and sore throat.   Eyes: Negative for blurred vision, photophobia, pain, discharge, redness and itching.  Respiratory: Negative for cough, shortness of breath, wheezing and stridor.   Cardiovascular: Negative for chest pain,  palpitations, orthopnea and PND.  Gastrointestinal: Negative for abdominal pain, anorexia, blood in stool, constipation, diarrhea, nausea and vomiting.  Endocrine: Negative for cold intolerance, heat intolerance, polydipsia, polyphagia and polyuria.  Genitourinary: Negative for dysuria, flank pain, frequency, hematuria, menstrual problem, pelvic pain, urgency, vaginal bleeding and vaginal discharge.  Musculoskeletal: Negative for arthralgias, back pain, myalgias and neck pain.  Skin: Positive for rash.  Allergic/Immunologic: Negative for environmental allergies and food allergies.  Neurological: Negative for dizziness, weakness, light-headedness, numbness and headaches.  Hematological: Negative for adenopathy. Does not bruise/bleed easily.  Psychiatric/Behavioral: Negative for dysphoric mood. The patient is not nervous/anxious.     Patient Active Problem List   Diagnosis Date Noted  . Sinus bradycardia 03/16/2018  . PSVT (paroxysmal supraventricular tachycardia) (HCC) 03/16/2018  . NSVT (nonsustained ventricular tachycardia) (HCC) 03/16/2018  . 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 Hyperlipidemia 09/24/2015  . Diabetes mellitus with no complication (HCC) 03/16/2015  . 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 Arthritis of knee, degenerative 03/16/2015  . Sinus infection 03/16/2015  . Diabetes (HCC)   . Essential hypertension   . GERD (gastroesophageal reflux disease)   . Status post total left knee replacement     Allergies  Allergen Reactions  . Nsaids   . Lisinopril     Cough    Past Surgical History:  Procedure Laterality Date  . BACK SURGERY    . CATARACT EXTRACTION Bilateral 03/2016  . CERVICAL DISC SURGERY  12/28/2010   "bulging disc; went in on the left side of my neck" (08/19/2013)  . JOINT REPLACEMENT    . TOTAL KNEE ARTHROPLASTY Left 03/28/2011   Dr Thurston Hole  . TOTAL KNEE ARTHROPLASTY Right 08/19/2013   . TOTAL KNEE ARTHROPLASTY Right  08/19/2013   Procedure: TOTAL KNEE ARTHROPLASTY;  Surgeon: Lorn Junes, MD;  Location: [facility];  Service: Orthopedics;  Laterality: Right;    Social History   Tobacco Use  . Smoking status: Never Smoker  . Smokeless tobacco: Never Used  Substance Use Topics  . Alcohol use: No  . Drug use: No     Medication list has been reviewed and updated.  Current Meds  Medication Sig  . acetaminophen (TYLENOL) 325 MG tablet Take 2 tablets (650 mg total) by mouth every 6 (six) hours as needed.  Marland Kitchen amLODipine (NORVASC) 5 MG tablet Take 1 tablet (5 mg total) by mouth daily.  Marland Kitchen aspirin 81 MG tablet Take 81 mg by mouth daily.  . carbamide peroxide (DEBROX) 6.5 % OTIC solution Place 5 drops into both ears 2 (two) times daily.  . cyanocobalamin 100 MCG tablet Take 1 tablet by mouth daily.   . diclofenac sodium (VOLTAREN) 1 % GEL Apply 4 g topically 4 (four) times daily as needed.  . docusate sodium (COLACE) 50 MG capsule Take 1 capsule (50 mg total) by mouth 2 (two) times daily.  Marland Kitchen glucose blood (ONE TOUCH ULTRA TEST) test strip USE 1 STRIP TO CHECK GLUCOSE ONCE DAILY  . metFORMIN (GLUCOPHAGE) 500 MG tablet TAKE 1 TABLET BY MOUTH TWICE DAILY WITH MEALS (LAST  TIME  FILLING)  . mometasone (NASONEX) 50 MCG/ACT nasal spray Place 2 sprays into the nose daily.  . montelukast (SINGULAIR) 10 MG tablet Take 1 tablet (10 mg total) by mouth at bedtime.  Marland Kitchen olmesartan (BENICAR) 20 MG tablet Take 1 tablet (20 mg total) by mouth daily.  Marland Kitchen omeprazole (PRILOSEC) 20 MG capsule Take 1 capsule (20 mg total) by mouth daily.    PHQ 2/9 Scores 05/17/2019 10/01/2018 06/23/2017 05/10/2016  PHQ - 2 Score 0 0 0 0  PHQ- 9 Score 0 0 - -    BP Readings from Last 3 Encounters:  07/23/19 (!) 142/80  05/17/19 120/70  03/01/19 120/70    Physical Exam Vitals signs and nursing note reviewed.  Constitutional:      Appearance: She is well-developed.  HENT:     Head: Normocephalic.     Right  Ear: Tympanic membrane, ear canal and external ear normal.     Left Ear: Tympanic membrane, ear canal and external ear normal.     Nose: Nose normal.     Mouth/Throat:     Mouth: Mucous membranes are moist.  Eyes:     General: Lids are everted, no foreign bodies appreciated. No scleral icterus.       Left eye: No foreign body or hordeolum.     Conjunctiva/sclera: Conjunctivae normal.     Right eye: Right conjunctiva is not injected.     Left eye: Left conjunctiva is not injected.     Pupils: Pupils are equal, round, and reactive to light.  Neck:     Musculoskeletal: Normal range of motion and neck supple.     Thyroid: No thyromegaly.     Vascular: No JVD.     Trachea: No tracheal deviation.  Cardiovascular:     Rate and Rhythm: Normal rate and regular rhythm.     Heart sounds: S1 normal and S2 normal. Murmur present. Systolic murmur present with a grade of 1/6. No diastolic murmur. No friction rub. No gallop. No S3 or S4 sounds.   Pulmonary:     Effort: Pulmonary effort is normal. No respiratory distress.     Breath sounds:  Normal breath sounds. No decreased breath sounds, wheezing, rhonchi or rales.  Abdominal:     General: Abdomen is flat. Bowel sounds are normal.     Palpations: Abdomen is soft. There is no mass.     Tenderness: There is no abdominal tenderness. There is no guarding or rebound.  Musculoskeletal: Normal range of motion.        General: No tenderness.     Right lower leg: No edema.     Left lower leg: No edema.  Lymphadenopathy:     Cervical: No cervical adenopathy.  Skin:    General: Skin is warm.     Findings: Rash present. Rash is macular and scaling.     Comments: ? seb kerotoses  Neurological:     Mental Status: She is alert and oriented to person, place, and time.     Cranial Nerves: No cranial nerve deficit.     Deep Tendon Reflexes: Reflexes normal.  Psychiatric:        Mood and Affect: Mood is not anxious or depressed.     Wt Readings from Last  3 Encounters:  07/23/19 190 lb (86.2 kg)  05/17/19 184 lb (83.5 kg)  02/25/19 183 lb (83 kg)    BP (!) 142/80   Ht 5\' 5"  (1.651 m)   Wt 190 lb (86.2 kg)   BMI 31.62 kg/m    Assessment and Plan:  1. Essential hypertension Chronic.  Controlled.  Continue amlodipine 5 mg once a day and olmesartan 20 mg once a day.  Will check renal function panel.  Patient has an upcoming appointment with cardiology in the coming weeks. - amLODipine (NORVASC) 5 MG tablet; Take 1 tablet (5 mg total) by mouth daily.  Dispense: 90 tablet; Refill: 1 - olmesartan (BENICAR) 20 MG tablet; Take 1 tablet (20 mg total) by mouth daily.  Dispense: 90 tablet; Refill: 1 - Renal Function Panel  2. Seborrheic keratoses Patient concerned about some discoloration of daily left lower leg consistent with seborrheic keratoses and venous stasis changes.  Unlikely think that this is combination and will refer to dermatology because patient wants to know if there is anything that can be done. - Ambulatory referral to Dermatology  3. Familial hypercholesterolemia Chronic.  Controlled.  Continue diet control.  And will check lipid panel today. - Lipid panel  4. Influenza vaccine needed Discussed and administered. - Flu Vaccine QUAD High Dose(Fluad)

## 2019-07-23 NOTE — Patient Instructions (Signed)
Chronic Venous Insufficiency Chronic venous insufficiency is a condition where the leg veins cannot effectively pump blood from the legs to the heart. This happens when the vein walls are either stretched, weakened, or damaged, or when the valves inside the vein are damaged. With the right treatment, you should be able to continue with an active life. This condition is also called venous stasis. What are the causes? Common causes of this condition include:  High blood pressure inside the veins (venous hypertension).  Sitting or standing too long, causing increased blood pressure in the leg veins.  A blood clot that blocks blood flow in a vein (deep vein thrombosis, DVT).  Inflammation of a vein (phlebitis) that causes a blood clot to form.  Tumors in the pelvis that cause blood to back up. What increases the risk? The following factors may make you more likely to develop this condition:  Having a family history of this condition.  Obesity.  Pregnancy.  Living without enough regular physical activity or exercise (sedentary lifestyle).  Smoking.  Having a job that requires long periods of standing or sitting in one place.  Being a certain age. Women in their 40s and 50s and men in their 70s are more likely to develop this condition. What are the signs or symptoms? Symptoms of this condition include:  Veins that are enlarged, bulging, or twisted (varicose veins).  Skin breakdown or ulcers.  Reddened skin or dark discoloration of skin on the leg between the knee and ankle.  Brown, smooth, tight, and painful skin just above the ankle, usually on the inside of the leg (lipodermatosclerosis).  Swelling of the legs. How is this diagnosed? This condition may be diagnosed based on:  Your medical history.  A physical exam.  Tests, such as: ? A procedure that creates an image of a blood vessel and nearby organs and provides information about blood flow through the blood vessel  (duplex ultrasound). ? A procedure that tests blood flow (plethysmography). ? A procedure that looks at the veins using X-ray and dye (venogram). How is this treated? The goals of treatment are to help you return to an active life and to minimize pain or disability. Treatment depends on the severity of your condition, and it may include:  Wearing compression stockings. These can help relieve symptoms and help prevent your condition from getting worse. However, they do not cure the condition.  Sclerotherapy. This procedure involves an injection of a solution that shrinks damaged veins.  Surgery. This may involve: ? Removing a diseased vein (vein stripping). ? Cutting off blood flow through the vein (laser ablation surgery). ? Repairing or reconstructing a valve within the affected vein. Follow these instructions at home:      Wear compression stockings as told by your health care provider. These stockings help to prevent blood clots and reduce swelling in your legs.  Take over-the-counter and prescription medicines only as told by your health care provider.  Stay active by exercising, walking, or doing different activities. Ask your health care provider what activities are safe for you and how much exercise you need.  Drink enough fluid to keep your urine pale yellow.  Do not use any products that contain nicotine or tobacco, such as cigarettes, e-cigarettes, and chewing tobacco. If you need help quitting, ask your health care provider.  Keep all follow-up visits as told by your health care provider. This is important. Contact a health care provider if you:  Have redness, swelling, or more pain   in the affected area.  See a red streak or line that goes up or down from the affected area.  Have skin breakdown or skin loss in the affected area, even if the breakdown is small.  Get an injury in the affected area. Get help right away if:  You get an injury and an open wound in the  affected area.  You have: ? Severe pain that does not get better with medicine. ? Sudden numbness or weakness in the foot or ankle below the affected area. ? Trouble moving your foot or ankle. ? A fever. ? Worse or persistent symptoms. ? Chest pain. ? Shortness of breath. Summary  Chronic venous insufficiency is a condition where the leg veins cannot effectively pump blood from the legs to the heart.  Chronic venous insufficiency occurs when the vein walls become stretched, weakened, or damaged, or when valves within the vein are damaged.  Treatment depends on how severe your condition is. It often involves wearing compression stockings and may involve having a procedure.  Make sure you stay active by exercising, walking, or doing different activities. Ask your health care provider what activities are safe for you and how much exercise you need. This information is not intended to replace advice given to you by your health care provider. Make sure you discuss any questions you have with your health care provider. Document Released: 03/13/2007 Document Revised: 07/31/2018 Document Reviewed: 07/31/2018 Elsevier Patient Education  2020 [place]. Seborrheic Keratosis A seborrheic keratosis is a common, noncancerous (benign) skin growth. These growths are velvety, waxy, rough, tan, brown, or black spots that appear on the skin. These skin growths can be flat or raised, and scaly. What are the causes? The cause of this condition is not known. What increases the risk? You are more likely to develop this condition if you:  Have a family history of seborrheic keratosis.  Are 50 or older.  Are pregnant.  Have had estrogen replacement therapy. What are the signs or symptoms? Symptoms of this condition include growths on the face, chest, shoulders, back, or other areas. These growths:  Are usually painless, but may become irritated and itchy.  Can be yellow, brown, black, or other  colors.  Are slightly raised or have a flat surface.  Are sometimes rough or wart-like in texture.  Are often velvety or waxy on the surface.  Are round or oval-shaped.  Often occur in groups, but may occur as a single growth. How is this diagnosed? This condition is diagnosed with a medical history and physical exam.  A sample of the growth may be tested (skin biopsy).  You may need to see a skin specialist (dermatologist). How is this treated? Treatment is not usually needed for this condition, unless the growths are irritated or bleed often.  You may also choose to have the growths removed if you do not like their appearance. ? Most commonly, these growths are treated with a procedure in which liquid nitrogen is applied to "freeze" off the growth (cryosurgery). ? They may also be burned off with electricity (electrocautery) or removed by scraping (curettage). Follow these instructions at home:  Watch your growth for any changes.  Keep all follow-up visits as told by your health care provider. This is important.  Do not scratch or pick at the growth or growths. This can cause them to become irritated or infected. Contact a health care provider if:  You suddenly have many new growths.  Your growth bleeds, itches,  or hurts.  Your growth suddenly becomes larger or changes color. Summary  A seborrheic keratosis is a common, noncancerous (benign) skin growth.  Treatment is not usually needed for this condition, unless the growths are irritated or bleed often.  Watch your growth for any changes.  Contact a health care provider if you suddenly have many new growths or your growth suddenly becomes larger or changes color.  Keep all follow-up visits as told by your health care provider. This is important. This information is not intended to replace advice given to you by your health care provider. Make sure you discuss any questions you have with your health care provider.  Document Released: 12/10/2010 Document Revised: 03/22/2018 Document Reviewed: 03/22/2018 Elsevier Patient Education  2020 [employer]

## 2019-07-24 LAB — LIPID PANEL
Chol/HDL Ratio: 2.2 ratio (ref 0.0–4.4)
Cholesterol, Total: 197 mg/dL (ref 100–199)
HDL: 88 mg/dL (ref >39)
LDL Chol Calc (NIH): 101 mg/dL — ABNORMAL HIGH (ref 0–99)
Triglycerides: 40 mg/dL (ref 0–149)
VLDL Cholesterol Cal: 8 mg/dL (ref 5–40)

## 2019-07-24 LAB — RENAL FUNCTION PANEL
Albumin: 4.3 g/dL (ref 3.6–4.6)
BUN/Creatinine Ratio: 22 (ref 12–28)
BUN: 16 mg/dL (ref 8–27)
CO2: 20 mmol/L (ref 20–29)
Calcium: 9.9 mg/dL (ref 8.7–10.3)
Chloride: 103 mmol/L (ref 96–106)
Creatinine, Ser: 0.73 mg/dL (ref 0.57–1.00)
GFR calc Af Amer: 87 mL/min/{1.73_m2} (ref >59)
GFR calc non Af Amer: 75 mL/min/{1.73_m2} (ref >59)
Glucose: 135 mg/dL — ABNORMAL HIGH (ref 65–99)
Phosphorus: 3.5 mg/dL (ref 3.0–4.3)
Potassium: 4.7 mmol/L (ref 3.5–5.2)
Sodium: 140 mmol/L (ref 134–144)

## 2019-08-01 DIAGNOSIS — H26491 Other secondary cataract, right eye: Secondary | ICD-10-CM | POA: Diagnosis not present

## 2019-08-07 ENCOUNTER — Ambulatory Visit: Payer: Medicare Other | Admitting: Internal Medicine

## 2019-08-20 DIAGNOSIS — M5416 Radiculopathy, lumbar region: Secondary | ICD-10-CM | POA: Diagnosis not present

## 2019-08-20 DIAGNOSIS — M545 Low back pain: Secondary | ICD-10-CM | POA: Diagnosis not present

## 2019-08-20 DIAGNOSIS — M47816 Spondylosis without myelopathy or radiculopathy, lumbar region: Secondary | ICD-10-CM | POA: Diagnosis not present

## 2019-08-26 ENCOUNTER — Ambulatory Visit: Payer: Medicare Other | Admitting: Internal Medicine

## 2019-09-02 ENCOUNTER — Encounter: Payer: Self-pay | Admitting: Internal Medicine

## 2019-09-02 ENCOUNTER — Other Ambulatory Visit: Payer: Self-pay

## 2019-09-02 ENCOUNTER — Ambulatory Visit (INDEPENDENT_AMBULATORY_CARE_PROVIDER_SITE_OTHER): Payer: Medicare Other | Admitting: Internal Medicine

## 2019-09-02 VITALS — BP 160/70 | HR 70 | Ht 65.0 in | Wt 194.2 lb

## 2019-09-02 DIAGNOSIS — I10 Essential (primary) hypertension: Secondary | ICD-10-CM | POA: Diagnosis not present

## 2019-09-02 DIAGNOSIS — I493 Ventricular premature depolarization: Secondary | ICD-10-CM

## 2019-09-02 DIAGNOSIS — I471 Supraventricular tachycardia: Secondary | ICD-10-CM

## 2019-09-02 DIAGNOSIS — R001 Bradycardia, unspecified: Secondary | ICD-10-CM | POA: Diagnosis not present

## 2019-09-02 DIAGNOSIS — I472 Ventricular tachycardia: Secondary | ICD-10-CM | POA: Diagnosis not present

## 2019-09-02 DIAGNOSIS — I4729 Other ventricular tachycardia: Secondary | ICD-10-CM

## 2019-09-02 DIAGNOSIS — R600 Localized edema: Secondary | ICD-10-CM

## 2019-09-02 NOTE — Progress Notes (Signed)
Follow-up Outpatient Visit Date: 09/02/2019  Primary Care Provider: Juline Patch, MD [address] [place] [address]  Chief Complaint: Follow-up hypertension and bradycardia  HPI:  [name] is a 85 y.o. year-old female with history of hypertension and type 2 diabetes mellitus, who presents for follow-up of bradycardia.  She initially presented to [facility] in 01/2018 with dizziness after discontinuation of losartan.  She was found to be bradycardic with uncontrolled hypertension.  Subsequent monitor showed predominantly sinus rhythm with brief SVT and NSVT.  She noted a cough since being started on lisinopril (losartan, which she previously tolerated, was on recall poor).  We therefore agreed to switch to olmesartan.  At the time of her follow-up visit in 06/2018, she was feeling well with interval resolution of cough.  Due to suboptimal blood pressure control, amlodipine was increased to 5 mg daily.  Today, [name] reports feeling well.  She denies chest pain, shortness of breath, palpitations, lightheadedness, and orthopnea.  She has noticed occasional dependent edema, including today.  She does not limit her sodium intake.  She does not check her blood pressure regularly at home.  --------------------------------------------------------------------------------------------------  Cardiovascular History & Procedures: Cardiovascular Problems:  Bradycardia with paroxysmal SVT and NSVT  Risk Factors:  Hypertension, age greater than 69, and obesity  Cath/PCI:  None  CV Surgery:  None  EP Procedures and Devices:  14-day event monitor (02/07/2018): Predominantly sinus rhythm with occasional PVC's and rare PAC's. Brief runs of SVT and NSVT noted.  Non-Invasive Evaluation(s):  Pharmacologic myocardial perfusion stress test (02/07/2018): Normal study without ischemia or scar.  LVEF 54%.  TTE (02/06/2018): Technically difficult study.  Normal LV size.  LVEF 65  to 70% with grade 1 diastolic dysfunction and elevated filling pressure.  Mild to moderate aortic regurgitation.  Mild left atrial enlargement.  Normal RV size and function.  Mildly to moderately increased pulmonary artery pressure.  Recent CV Pertinent Labs: Lab Results  Component Value Date   CHOL 197 07/23/2019   HDL 88 07/23/2019   LDLCALC 101 (H) 07/23/2019   TRIG 40 07/23/2019   CHOLHDL 2.2 07/23/2019   INR 1.10 08/12/2013   K 4.7 07/23/2019   MG 1.9 03/28/2018   BUN 16 07/23/2019   CREATININE 0.73 07/23/2019    Past medical and surgical history were reviewed and updated in EPIC.  Current Meds  Medication Sig  . acetaminophen (TYLENOL) 325 MG tablet Take 2 tablets (650 mg total) by mouth every 6 (six) hours as needed.  Marland Kitchen amLODipine (NORVASC) 5 MG tablet Take 1 tablet (5 mg total) by mouth daily.  Marland Kitchen aspirin 81 MG tablet Take 81 mg by mouth daily.  . carbamide peroxide (DEBROX) 6.5 % OTIC solution Place 5 drops into both ears 2 (two) times daily.  . cyanocobalamin 100 MCG tablet Take 1 tablet by mouth daily.   . diclofenac sodium (VOLTAREN) 1 % GEL Apply 4 g topically 4 (four) times daily as needed.  . docusate sodium (COLACE) 50 MG capsule Take 1 capsule (50 mg total) by mouth 2 (two) times daily.  Marland Kitchen glucose blood (ONE TOUCH ULTRA TEST) test strip USE 1 STRIP TO CHECK GLUCOSE ONCE DAILY  . metFORMIN (GLUCOPHAGE) 500 MG tablet TAKE 1 TABLET BY MOUTH TWICE DAILY WITH MEALS (LAST  TIME  FILLING)  . mometasone (NASONEX) 50 MCG/ACT nasal spray Place 2 sprays into the nose daily.  . montelukast (SINGULAIR) 10 MG tablet Take 1 tablet (10 mg total) by mouth at  bedtime.  Marland Kitchen olmesartan (BENICAR) 20 MG tablet Take 1 tablet (20 mg total) by mouth daily.  Marland Kitchen omeprazole (PRILOSEC) 20 MG capsule Take 1 capsule (20 mg total) by mouth daily.    Allergies: Nsaids and Lisinopril  Social History   Tobacco Use  . Smoking status: Never Smoker  . Smokeless tobacco: Never Used  Substance Use  Topics  . Alcohol use: No  . Drug use: No    Family History  Problem Relation Age of Onset  . Diabetes Mother   . Cerebral aneurysm Father     Review of Systems: A 12-system review of systems was performed and was negative except as noted in the HPI.  --------------------------------------------------------------------------------------------------  Physical Exam: BP (!) 160/70 (BP Location: Left Arm, Patient Position: Sitting, Cuff Size: Large)   Pulse 70   Ht 5\' 5"  (1.651 m)   Wt 194 lb 4 oz (88.1 kg)   SpO2 98%   BMI 32.32 kg/m  Repeat blood pressure: 134/60  General: NAD. HEENT: No conjunctival pallor or scleral icterus.  Facemask in place. Neck: Supple without lymphadenopathy, thyromegaly, JVD, or HJR. Lungs: Normal work of breathing. Clear to auscultation bilaterally without wheezes or crackles. Heart: Regular rate and rhythm with occasional extrasystoles.  No murmurs, rubs, or gallops. Non-displaced PMI. Abd: Bowel sounds present. Soft, NT/ND without hepatosplenomegaly Ext: 1+ nonpitting edema in both distal calves. Skin: Warm and dry without rash.  EKG: Normal sinus rhythm with occasional PVCs and left bundle branch block.  No significant change from prior tracing on 07/09/2018.  Lab Results  Component Value Date   WBC 6.2 02/06/2018   HGB 11.5 (L) 02/06/2018   HCT 35.8 02/06/2018   MCV 86.5 02/06/2018   PLT 266 02/06/2018    Lab Results  Component Value Date   NA 140 07/23/2019   K 4.7 07/23/2019   CL 103 07/23/2019   CO2 20 07/23/2019   BUN 16 07/23/2019   CREATININE 0.73 07/23/2019   GLUCOSE 135 (H) 07/23/2019   ALT 12 (L) 02/05/2018    Lab Results  Component Value Date   CHOL 197 07/23/2019   HDL 88 07/23/2019   LDLCALC 101 (H) 07/23/2019   TRIG 40 07/23/2019   CHOLHDL 2.2 07/23/2019    --------------------------------------------------------------------------------------------------  ASSESSMENT AND PLAN: Bradycardia: Heart rate in the  70s today.  No symptoms to suggest significant bradycardia.  No further work-up at this time.  Hypertension: Blood pressure initially elevated but improved on recheck (134/60).  Encouraged sodium restriction.  Continue current doses of amlodipine and olmesartan.  PVCs, PSVT, and NSVT: No symptoms reported.  EKG today again shows occasional PVCs.  No further work-up or intervention at this time.  Leg edema: Intermittent and predominantly dependent.  I encouraged [name] to limit her sodium intake and to consider wearing compression stockings if she is going to be seated/standing for extended periods.  If this remains a problem, we may need to consider switching amlodipine to an alternative agent.  Follow-up: Return to clinic in 6 months.  09/22/2019, MD 09/02/2019 3:02 PM

## 2019-09-02 NOTE — Patient Instructions (Signed)
Dr End recommends you follow a low sodium diet. See handout provided.   Medication Instructions:  Your physician recommends that you continue on your current medications as directed. Please refer to the Current Medication list given to you today.  If you need a refill on your cardiac medications before your next appointment, please call your pharmacy.   Lab work: NONE If you have labs (blood work) drawn today and your tests are completely normal, you will receive your results only by: Marland Kitchen MyChart Message (if you have MyChart) OR . A paper copy in the mail If you have any lab test that is abnormal or we need to change your treatment, we will call you to review the results.  Testing/Procedures: NONE  Follow-Up: At [facility], you and your health needs are our priority.  As part of our continuing mission to provide you with exceptional heart care, we have created designated Provider Care Teams.  These Care Teams include your primary Cardiologist (physician) and Advanced Practice Providers (APPs -  Physician Assistants and Nurse Practitioners) who all work together to provide you with the care you need, when you need it. You will need a follow up appointment in 6 months.   Please call our office 2 months in advance to schedule this appointment.  You may see DR Harrell Gave END  or one of the following Advanced Practice Providers on your designated Care Team:   Murray Hodgkins, NP Christell Faith, PA-C . Marrianne Mood, PA-C

## 2019-09-04 ENCOUNTER — Encounter: Payer: Self-pay | Admitting: Internal Medicine

## 2019-09-04 DIAGNOSIS — R001 Bradycardia, unspecified: Secondary | ICD-10-CM | POA: Insufficient documentation

## 2019-09-04 DIAGNOSIS — R600 Localized edema: Secondary | ICD-10-CM | POA: Insufficient documentation

## 2019-09-04 DIAGNOSIS — I493 Ventricular premature depolarization: Secondary | ICD-10-CM | POA: Insufficient documentation

## 2019-10-14 DIAGNOSIS — L300 Nummular dermatitis: Secondary | ICD-10-CM | POA: Diagnosis not present

## 2019-10-20 ENCOUNTER — Other Ambulatory Visit: Payer: Self-pay | Admitting: Family Medicine

## 2019-10-20 DIAGNOSIS — J3089 Other allergic rhinitis: Secondary | ICD-10-CM

## 2019-11-27 ENCOUNTER — Ambulatory Visit (INDEPENDENT_AMBULATORY_CARE_PROVIDER_SITE_OTHER): Payer: Medicare Other | Admitting: Family Medicine

## 2019-11-27 ENCOUNTER — Other Ambulatory Visit: Payer: Self-pay

## 2019-11-27 ENCOUNTER — Encounter: Payer: Self-pay | Admitting: Family Medicine

## 2019-11-27 VITALS — BP 124/70 | HR 64 | Ht 65.0 in | Wt 183.0 lb

## 2019-11-27 DIAGNOSIS — E119 Type 2 diabetes mellitus without complications: Secondary | ICD-10-CM

## 2019-11-27 DIAGNOSIS — K219 Gastro-esophageal reflux disease without esophagitis: Secondary | ICD-10-CM | POA: Diagnosis not present

## 2019-11-27 MED ORDER — METFORMIN HCL 500 MG PO TABS: ORAL_TABLET | ORAL | 1 refills | Status: DC

## 2019-11-27 MED ORDER — OMEPRAZOLE 20 MG PO CPDR: 20.0000 mg | DELAYED_RELEASE_CAPSULE | Freq: Every day | ORAL | 11 refills | Status: DC

## 2019-11-27 NOTE — Patient Instructions (Signed)
Chondroitin; Glucosamine tablets or capsules What is this medicine? CHONDROITIN; GLUCOSAMINE (chon DROI tin; gloo KOH suh meen) is a dietary supplement. It is promoted for its ability to reduce the symptoms of osteoarthritis by maintaining healthy joint cartilage. The FDA has not approved this supplement for any medical use. This medicine may be used for other purposes; ask your health care provider or pharmacist if you have questions. COMMON BRAND NAME(S): OptiFlex Complete, OptiFlex Sport What should I tell my health care provider before I take this medicine? They need to know if you have any of these conditions:  diabetes  heart disease  kidney disease  liver disease  stomach or intestinal problems  an unusual or allergic reaction to chondroitin, glucosamine, sulfonamides, other medicines, foods, dyes, or preservatives  pregnant or trying to get pregnant  breast-feeding How should I use this medicine? Take this supplement by mouth with a glass of water. Follow the directions on the package labeling, or take as directed by your health care professional. Take your medicine at regular intervals. Do not take this supplement more often than directed. Talk to your pediatrician regarding the use of this medicine in children. Special care may be needed. Overdosage: If you think you have taken too much of this medicine contact a poison control center or emergency room at once. NOTE: This medicine is only for you. Do not share this medicine with others. What if I miss a dose? If you miss a dose, take it as soon as you can. If it is almost time for your next dose, take only that dose. Do not take double or extra doses. What may interact with this medicine? Check with your doctor or healthcare professional if you are taking any of the following medications:  warfarin This list may not describe all possible interactions. Give your health care provider a list of all the medicines, herbs,  non-prescription drugs, or dietary supplements you use. Also tell them if you smoke, drink alcohol, or use illegal drugs. Some items may interact with your medicine. What should I watch for while using this medicine? Tell your doctor or healthcare professional if your symptoms do not start to get better or if they get worse. This supplement may take several weeks to work for you. If you are scheduled for any medical or dental procedure, tell your healthcare provider that you are taking this supplement. You may need to stop taking this supplement before the procedure. This supplement may affect blood sugar levels. If you have diabetes, check with your doctor or health care professional before you change your diet or the dose of your diabetic medicine. Herbal or dietary supplements are not regulated like medicines. Rigid quality control standards are not required for dietary supplements. The purity and strength of these products can vary. The safety and effect of this dietary supplement for a certain disease or illness is not well known. This product is not intended to diagnose, treat, cure or prevent any disease. The Food and Drug Administration suggests the following to help consumers protect themselves:  Always read product labels and follow directions.  Natural does not mean a product is safe for humans to take.  Look for products that include USP after the ingredient name. This means that the manufacturer followed the standards of the [place] Pharmacopoeia.  Supplements made or sold by a nationally known food or drug company are more likely to be made under tight controls. You can write to the company for more information about how   the product was made. What side effects may I notice from receiving this medicine? Side effects that you should report to your doctor or health care professional as soon as possible:  allergic reactions like skin rash, itching or hives, swelling of the face, lips, or  tongue  breathing problems  constipation  diarrhea  difficulty sleeping  drowsiness  hair loss  headache  loss of appetite  stomach pain  swelling of the ankles or feet Side effects that usually do not require medical attention (report to your doctor or health care professional if they continue or are bothersome):  gas  nausea  upset stomach This list may not describe all possible side effects. Call your doctor for medical advice about side effects. You may report side effects to FDA at 1-800-FDA-1088. Where should I keep my medicine? Keep out of the reach of children. Store at room temperature between 15 and 30 degrees C (59 and 86 degrees F) or as directed on the package label. Protect from moisture. Throw away any unused supplement after the expiration date. NOTE: This sheet is a summary. It may not cover all possible information. If you have questions about this medicine, talk to your doctor, pharmacist, or health care provider.  2020 Elsevier/Gold Standard (2014-12-01 06:58:19)  

## 2019-11-27 NOTE — Progress Notes (Signed)
Date:  11/27/2019   Name:  [name]   DOB:  1932/03/14   MRN:  [id number]   Chief Complaint: Diabetes and Shoulder Pain  Diabetes She presents for her follow-up diabetic visit. She has type 2 diabetes mellitus. Her disease course has been stable. There are no hypoglycemic associated symptoms. Pertinent negatives for hypoglycemia include no dizziness, headaches or nervousness/anxiousness. There are no diabetic associated symptoms. Pertinent negatives for diabetes include no chest pain, no fatigue, no polydipsia, no polyphagia, no polyuria and no weakness. There are no hypoglycemic complications. Symptoms are stable. There are no diabetic complications. Risk factors for coronary artery disease include diabetes mellitus, dyslipidemia and hypertension. Current diabetic treatment includes oral agent (monotherapy). She is compliant with treatment all of the time. She is following a generally healthy diet. Meal planning includes avoidance of concentrated sweets and carbohydrate counting. She participates in exercise intermittently. Her breakfast blood glucose is taken between 8-9 am. Her breakfast blood glucose range is generally 110-130 mg/dl. An ACE inhibitor/angiotensin II receptor blocker is being taken. She does not see a podiatrist.Eye exam is current.  Shoulder Pain  The pain is present in the right knee and left knee. This is a chronic problem. The current episode started more than 1 year ago. The problem occurs daily. The problem has been gradually improving. The pain is at a severity of 5/10. The pain is moderate. Pertinent negatives include no fever or numbness. Her past medical history is significant for osteoarthritis. bilateral knee replacement  Gastroesophageal Reflux She reports no abdominal pain, no belching, no chest pain, no choking, no coughing, no dysphagia, no early satiety, no nausea, no sore throat or no wheezing. This is a recurrent problem. The problem has been gradually  improving. Pertinent negatives include no fatigue or melena. She has tried a PPI for the symptoms. The treatment provided moderate relief.    Lab Results  Component Value Date   CREATININE 0.73 07/23/2019   BUN 16 07/23/2019   NA 140 07/23/2019   K 4.7 07/23/2019   CL 103 07/23/2019   CO2 20 07/23/2019   Lab Results  Component Value Date   CHOL 197 07/23/2019   HDL 88 07/23/2019   LDLCALC 101 (H) 07/23/2019   TRIG 40 07/23/2019   CHOLHDL 2.2 07/23/2019   Lab Results  Component Value Date   TSH 3.394 02/06/2018   Lab Results  Component Value Date   HGBA1C 6.8 (H) 05/17/2019     Review of Systems  Constitutional: Negative.  Negative for chills, fatigue, fever and unexpected weight change.  HENT: Negative for congestion, ear discharge, ear pain, rhinorrhea, sinus pressure, sneezing and sore throat.   Eyes: Negative for photophobia, pain, discharge, redness and itching.  Respiratory: Negative for cough, choking, shortness of breath, wheezing and stridor.   Cardiovascular: Negative for chest pain.  Gastrointestinal: Negative for abdominal pain, blood in stool, constipation, diarrhea, dysphagia, melena, nausea and vomiting.  Endocrine: Negative for cold intolerance, heat intolerance, polydipsia, polyphagia and polyuria.  Genitourinary: Negative for dysuria, flank pain, frequency, hematuria, menstrual problem, pelvic pain, urgency, vaginal bleeding and vaginal discharge.  Musculoskeletal: Negative for arthralgias, back pain and myalgias.  Skin: Negative for rash.  Allergic/Immunologic: Negative for environmental allergies and food allergies.  Neurological: Negative for dizziness, weakness, light-headedness, numbness and headaches.  Hematological: Negative for adenopathy. Does not bruise/bleed easily.  Psychiatric/Behavioral: Negative for dysphoric mood. The patient is not nervous/anxious.     Patient Active Problem List   Diagnosis Date  Noted  . Bradycardia 09/04/2019  .  PVC's (premature ventricular contractions) 09/04/2019  . Leg edema 09/04/2019  . Sinus bradycardia 03/16/2018  . PSVT (paroxysmal supraventricular tachycardia) ([place]) 03/16/2018  . NSVT (nonsustained ventricular tachycardia) ([place]) 03/16/2018  . 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 Hyperlipidemia 09/24/2015  . Diabetes mellitus with no complication ([place]) 93/23/5573  . 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 Arthritis of knee, degenerative 03/16/2015  . Sinus infection 03/16/2015  . Diabetes ([place])   . Essential hypertension   . GERD (gastroesophageal reflux disease)   . Status post total left knee replacement     Allergies  Allergen Reactions  . Nsaids   . Lisinopril     Cough    Past Surgical History:  Procedure Laterality Date  . BACK SURGERY    . CATARACT EXTRACTION Bilateral 03/2016  . CERVICAL DISC SURGERY  12/28/2010   "bulging disc; went in on the left side of my neck" (08/19/2013)  . JOINT REPLACEMENT    . TOTAL KNEE ARTHROPLASTY Left 03/28/2011   Dr Noemi Chapel  . TOTAL KNEE ARTHROPLASTY Right 08/19/2013  . TOTAL KNEE ARTHROPLASTY Right 08/19/2013   Procedure: TOTAL KNEE ARTHROPLASTY;  Surgeon: Lorn Junes, MD;  Location: [facility];  Service: Orthopedics;  Laterality: Right;    Social History   Tobacco Use  . Smoking status: Never Smoker  . Smokeless tobacco: Never Used  Substance Use Topics  . Alcohol use: No  . Drug use: No     Medication list has been reviewed and updated.  Current Meds  Medication Sig  . acetaminophen (TYLENOL) 325 MG tablet Take 2 tablets (650 mg total) by mouth every 6 (six) hours as needed.  Marland Kitchen amLODipine (NORVASC) 5 MG tablet Take 1 tablet (5 mg total) by mouth daily.  Marland Kitchen aspirin 81 MG tablet Take 81 mg by mouth daily.  . carbamide peroxide (DEBROX) 6.5 % OTIC solution Place 5 drops into both ears 2 (two) times daily.  . cyanocobalamin 100 MCG tablet Take 1 tablet by mouth  daily.   . diclofenac sodium (VOLTAREN) 1 % GEL Apply 4 g topically 4 (four) times daily as needed.  . docusate sodium (COLACE) 50 MG capsule Take 1 capsule (50 mg total) by mouth 2 (two) times daily.  Marland Kitchen glucose blood (ONE TOUCH ULTRA TEST) test strip USE 1 STRIP TO CHECK GLUCOSE ONCE DAILY  . metFORMIN (GLUCOPHAGE) 500 MG tablet TAKE 1 TABLET BY MOUTH TWICE DAILY WITH MEALS (LAST  TIME  FILLING)  . mometasone (NASONEX) 50 MCG/ACT nasal spray Place 2 sprays into the nose daily.  . montelukast (SINGULAIR) 10 MG tablet TAKE 1 TABLET BY MOUTH AT BEDTIME  . olmesartan (BENICAR) 20 MG tablet Take 1 tablet (20 mg total) by mouth daily.  Marland Kitchen omeprazole (PRILOSEC) 20 MG capsule Take 1 capsule (20 mg total) by mouth daily.    PHQ 2/9 Scores 11/27/2019 05/17/2019 10/01/2018 06/23/2017  PHQ - 2 Score 0 0 0 0  PHQ- 9 Score 0 0 0 -    BP Readings from Last 3 Encounters:  11/27/19 124/70  09/02/19 (!) 160/70  07/23/19 (!) 142/80    Physical Exam Vitals and nursing note reviewed.  Constitutional:      General: She is not in acute distress.    Appearance: She is not diaphoretic.  HENT:     Head: Normocephalic and atraumatic.     Right Ear: External ear normal.  Left Ear: External ear normal.     Nose: Nose normal.     Mouth/Throat:     Mouth: Mucous membranes are moist.  Eyes:     General:        Right eye: No discharge.        Left eye: No discharge.     Conjunctiva/sclera: Conjunctivae normal.     Pupils: Pupils are equal, round, and reactive to light.  Neck:     Thyroid: No thyromegaly.     Vascular: No JVD.  Cardiovascular:     Rate and Rhythm: Normal rate and regular rhythm.     Heart sounds: Normal heart sounds. No murmur. No friction rub. No gallop.   Pulmonary:     Effort: Pulmonary effort is normal.     Breath sounds: Normal breath sounds. No wheezing, rhonchi or rales.  Chest:     Chest wall: No tenderness.  Abdominal:     General: Bowel sounds are normal.     Palpations:  Abdomen is soft. There is no mass.     Tenderness: There is no abdominal tenderness. There is no guarding.  Musculoskeletal:        General: Normal range of motion.     Cervical back: Normal range of motion and neck supple.     Left knee: Normal range of motion. Tenderness present over the lateral joint line.  Lymphadenopathy:     Cervical: No cervical adenopathy.  Skin:    General: Skin is warm and dry.     Capillary Refill: Capillary refill takes less than 2 seconds.  Neurological:     Mental Status: She is alert.     Deep Tendon Reflexes: Reflexes are normal and symmetric.     Wt Readings from Last 3 Encounters:  11/27/19 183 lb (83 kg)  09/02/19 194 lb 4 oz (88.1 kg)  07/23/19 190 lb (86.2 kg)    BP 124/70   Pulse 64   Ht 5\' 5"  (1.651 m)   Wt 183 lb (83 kg)   BMI 30.45 kg/m   Assessment and Plan: 1. Gastroesophageal reflux disease, unspecified whether esophagitis present Chronic.  Controlled.  Stable.  Will continue Prilosec 20 mg once a day.  We will recheck in 6 months.  2. Type 2 diabetes mellitus without complication, without long-term current use of insulin (HCC) Chronic.  Controlled.  Stable.  Continue Metformin 500 mg twice a day.  Will check A1c and microalbuminuria.  It was read discussed with patient the importance of dietary control of carbohydrates and concentrated sugars. - HgB A1c - Microalbumin, urine - omeprazole (PRILOSEC) 20 MG capsule; Take 1 capsule (20 mg total) by mouth daily.  Dispense: 30 capsule; Refill: 11

## 2019-11-28 LAB — MICROALBUMIN, URINE: Microalbumin, Urine: 4.2 ug/mL

## 2019-11-28 LAB — HEMOGLOBIN A1C
Est. average glucose Bld gHb Est-mCnc: 151 mg/dL
Hgb A1c MFr Bld: 6.9 % — ABNORMAL HIGH (ref 4.8–5.6)

## 2019-12-01 ENCOUNTER — Other Ambulatory Visit: Payer: Self-pay | Admitting: Family Medicine

## 2019-12-01 DIAGNOSIS — J3089 Other allergic rhinitis: Secondary | ICD-10-CM

## 2020-01-19 ENCOUNTER — Other Ambulatory Visit: Payer: Self-pay | Admitting: Family Medicine

## 2020-01-19 DIAGNOSIS — J3089 Other allergic rhinitis: Secondary | ICD-10-CM

## 2020-02-24 ENCOUNTER — Telehealth: Payer: Self-pay | Admitting: Family Medicine

## 2020-02-24 DIAGNOSIS — L218 Other seborrheic dermatitis: Secondary | ICD-10-CM | POA: Diagnosis not present

## 2020-02-24 DIAGNOSIS — L300 Nummular dermatitis: Secondary | ICD-10-CM | POA: Diagnosis not present

## 2020-02-24 NOTE — Telephone Encounter (Signed)
Left message for patient to call back and schedule Medicare Annual Wellness Visit (AWV) either virtually/audio only or in office. Whichever the patients preference is.  No history of AWV; please schedule at anytime with [facility]-Nurse Health Advisor. 

## 2020-03-18 ENCOUNTER — Encounter: Payer: Self-pay | Admitting: Family Medicine

## 2020-03-18 ENCOUNTER — Other Ambulatory Visit: Payer: Self-pay

## 2020-03-18 ENCOUNTER — Ambulatory Visit (INDEPENDENT_AMBULATORY_CARE_PROVIDER_SITE_OTHER): Payer: Medicare Other | Admitting: Family Medicine

## 2020-03-18 VITALS — BP 124/60 | HR 68 | Ht 65.0 in | Wt 184.0 lb

## 2020-03-18 DIAGNOSIS — E7800 Pure hypercholesterolemia, unspecified: Secondary | ICD-10-CM

## 2020-03-18 DIAGNOSIS — I10 Essential (primary) hypertension: Secondary | ICD-10-CM | POA: Diagnosis not present

## 2020-03-18 DIAGNOSIS — M175 Other unilateral secondary osteoarthritis of knee: Secondary | ICD-10-CM

## 2020-03-18 DIAGNOSIS — J301 Allergic rhinitis due to pollen: Secondary | ICD-10-CM

## 2020-03-18 DIAGNOSIS — J3089 Other allergic rhinitis: Secondary | ICD-10-CM | POA: Diagnosis not present

## 2020-03-18 DIAGNOSIS — K219 Gastro-esophageal reflux disease without esophagitis: Secondary | ICD-10-CM | POA: Diagnosis not present

## 2020-03-18 DIAGNOSIS — E119 Type 2 diabetes mellitus without complications: Secondary | ICD-10-CM

## 2020-03-18 MED ORDER — OMEPRAZOLE 20 MG PO CPDR: 20.0000 mg | DELAYED_RELEASE_CAPSULE | Freq: Every day | ORAL | 1 refills | Status: DC

## 2020-03-18 MED ORDER — METFORMIN HCL 500 MG PO TABS: ORAL_TABLET | ORAL | 1 refills | Status: DC

## 2020-03-18 MED ORDER — MOMETASONE FUROATE 50 MCG/ACT NA SUSP: 2.0000 | Freq: Every day | NASAL | 12 refills | Status: DC

## 2020-03-18 MED ORDER — MONTELUKAST SODIUM 10 MG PO TABS: 10.0000 mg | ORAL_TABLET | Freq: Every day | ORAL | 1 refills | Status: DC

## 2020-03-18 MED ORDER — OLMESARTAN MEDOXOMIL 20 MG PO TABS: 20.0000 mg | ORAL_TABLET | Freq: Every day | ORAL | 1 refills | Status: DC

## 2020-03-18 MED ORDER — AMLODIPINE BESYLATE 5 MG PO TABS: 5.0000 mg | ORAL_TABLET | Freq: Every day | ORAL | 1 refills | Status: DC

## 2020-03-18 NOTE — Progress Notes (Signed)
Date:  03/18/2020   Name:  [name]   DOB:  [birth year]   MRN:  [id number]   Chief Complaint: Diabetes, Gastroesophageal Reflux, Hypertension, Allergic Rhinitis , and Knee Pain  Diabetes She presents for her follow-up diabetic visit. She has type 2 diabetes mellitus. Her disease course has been stable. There are no hypoglycemic associated symptoms. Pertinent negatives for hypoglycemia include no dizziness, headaches or nervousness/anxiousness. There are no diabetic associated symptoms. Pertinent negatives for diabetes include no chest pain, no fatigue, no polydipsia, no polyphagia, no polyuria and no weakness. There are no hypoglycemic complications. Symptoms are stable. There are no diabetic complications. Pertinent negatives for diabetic complications include no CVA, PVD or retinopathy. Risk factors for coronary artery disease include diabetes mellitus, dyslipidemia and hypertension. Current diabetic treatment includes oral agent (monotherapy). She is compliant with treatment all of the time. Her weight is stable. She is following a generally healthy diet. Meal planning includes avoidance of concentrated sweets and carbohydrate counting. She participates in exercise intermittently. Her breakfast blood glucose is taken between 9-10 am. Her breakfast blood glucose range is generally 110-130 mg/dl. An ACE inhibitor/angiotensin II receptor blocker is being taken.  Gastroesophageal Reflux She reports no abdominal pain, no belching, no chest pain, no choking, no coughing, no dysphagia, no early satiety, no globus sensation, no heartburn, no hoarse voice, no nausea, no sore throat, no stridor, no tooth decay, no water brash or no wheezing. This is a chronic problem. The current episode started more than 1 year ago. The problem occurs frequently. The problem has been gradually improving. The symptoms are aggravated by certain foods. Pertinent negatives include no fatigue. She has tried a PPI (omeprazole  20 mg) for the symptoms. The treatment provided moderate relief.  Hypertension This is a chronic problem. The current episode started more than 1 year ago. The problem has been gradually improving since onset. The problem is controlled. Pertinent negatives include no chest pain, headaches, orthopnea, palpitations, PND or shortness of breath. There are no associated agents to hypertension. Past treatments include ACE inhibitors. The current treatment provides moderate improvement. There are no compliance problems.  There is no history of angina, kidney disease, CAD/MI, CVA, heart failure, left ventricular hypertrophy, PVD or retinopathy. There is no history of chronic renal disease, a hypertension causing med or renovascular disease.  Knee Pain  There was no injury mechanism. The pain is present in the left knee. The quality of the pain is described as aching. The pain is at a severity of 5/10. The pain is moderate. The pain has been constant since onset. Pertinent negatives include no numbness. The treatment provided moderate relief.  Hyperlipidemia This is a chronic problem. The current episode started more than 1 year ago. The problem is controlled. Recent lipid tests were reviewed and are normal. Exacerbating diseases include diabetes. She has no history of chronic renal disease, hypothyroidism, liver disease, obesity or nephrotic syndrome. Associated symptoms include leg pain. Pertinent negatives include no chest pain, myalgias or shortness of breath. Current antihyperlipidemic treatment includes statins. The current treatment provides mild improvement of lipids. There are no compliance problems.  Risk factors for coronary artery disease include dyslipidemia and diabetes mellitus.    Lab Results  Component Value Date   CREATININE 0.73 07/23/2019   BUN 16 07/23/2019   NA 140 07/23/2019   K 4.7 07/23/2019   CL 103 07/23/2019   CO2 20 07/23/2019   Lab Results  Component Value Date   CHOL 197  07/23/2019   HDL 88 07/23/2019   LDLCALC 101 (H) 07/23/2019   TRIG 40 07/23/2019   CHOLHDL 2.2 07/23/2019   Lab Results  Component Value Date   TSH 3.394 02/06/2018   Lab Results  Component Value Date   HGBA1C 6.9 (H) 11/27/2019   Lab Results  Component Value Date   WBC 6.2 02/06/2018   HGB 11.5 (L) 02/06/2018   HCT 35.8 02/06/2018   MCV 86.5 02/06/2018   PLT 266 02/06/2018   Lab Results  Component Value Date   ALT 12 (L) 02/05/2018   AST 14 (L) 02/05/2018   ALKPHOS 78 02/05/2018   BILITOT 0.5 02/05/2018     Review of Systems  Constitutional: Negative.  Negative for chills, fatigue, fever and unexpected weight change.  HENT: Negative for congestion, ear discharge, ear pain, hoarse voice, rhinorrhea, sinus pressure, sneezing and sore throat.   Eyes: Negative for photophobia, pain, discharge, redness and itching.  Respiratory: Negative for cough, choking, shortness of breath, wheezing and stridor.   Cardiovascular: Negative for chest pain, palpitations, orthopnea and PND.  Gastrointestinal: Negative for abdominal pain, blood in stool, constipation, diarrhea, dysphagia, heartburn, nausea and vomiting.  Endocrine: Negative for cold intolerance, heat intolerance, polydipsia, polyphagia and polyuria.  Genitourinary: Negative for dysuria, flank pain, frequency, hematuria, menstrual problem, pelvic pain, urgency, vaginal bleeding and vaginal discharge.  Musculoskeletal: Negative for arthralgias, back pain and myalgias.  Skin: Negative for rash.  Allergic/Immunologic: Negative for environmental allergies and food allergies.  Neurological: Negative for dizziness, weakness, light-headedness, numbness and headaches.  Hematological: Negative for adenopathy. Does not bruise/bleed easily.  Psychiatric/Behavioral: Negative for dysphoric mood. The patient is not nervous/anxious.     Patient Active Problem List   Diagnosis Date Noted  . Bradycardia 09/04/2019  . PVC's (premature  ventricular contractions) 09/04/2019  . Leg edema 09/04/2019  . Sinus bradycardia 03/16/2018  . PSVT (paroxysmal supraventricular tachycardia) (HCC) 03/16/2018  . NSVT (nonsustained ventricular tachycardia) (HCC) 03/16/2018  . 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 Hyperlipidemia 09/24/2015  . Diabetes mellitus with no complication (HCC) 03/16/2015  . 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 Arthritis of knee, degenerative 03/16/2015  . Sinus infection 03/16/2015  . Diabetes (HCC)   . Essential hypertension   . GERD (gastroesophageal reflux disease)   . Status post total left knee replacement     Allergies  Allergen Reactions  . Nsaids   . Lisinopril     Cough    Past Surgical History:  Procedure Laterality Date  . BACK SURGERY    . CATARACT EXTRACTION Bilateral 03/2016  . CERVICAL DISC SURGERY  12/28/2010   "bulging disc; went in on the left side of my neck" (08/19/2013)  . JOINT REPLACEMENT    . TOTAL KNEE ARTHROPLASTY Left 03/28/2011   Dr Thurston Hole  . TOTAL KNEE ARTHROPLASTY Right 08/19/2013  . TOTAL KNEE ARTHROPLASTY Right 08/19/2013   Procedure: TOTAL KNEE ARTHROPLASTY;  Surgeon: Nilda Simmer, MD;  Location: [facility] OR;  Service: Orthopedics;  Laterality: Right;    Social History   Tobacco Use  . Smoking status: Never Smoker  . Smokeless tobacco: Never Used  Substance Use Topics  . Alcohol use: No  . Drug use: No     Medication list has been reviewed and updated.  Current Meds  Medication Sig  . acetaminophen (TYLENOL) 325 MG tablet Take 2 tablets (650 mg total) by mouth every 6 (six) hours as needed.  Marland Kitchen amLODipine (NORVASC)  5 MG tablet Take 1 tablet (5 mg total) by mouth daily.  Marland Kitchen aspirin 81 MG tablet Take 81 mg by mouth daily.  . carbamide peroxide (DEBROX) 6.5 % OTIC solution Place 5 drops into both ears 2 (two) times daily.  . cyanocobalamin 100 MCG tablet Take 1 tablet by mouth daily.   Marland Kitchen  docusate sodium (COLACE) 50 MG capsule Take 1 capsule (50 mg total) by mouth 2 (two) times daily.  Marland Kitchen glucose blood (ONE TOUCH ULTRA TEST) test strip USE 1 STRIP TO CHECK GLUCOSE ONCE DAILY  . metFORMIN (GLUCOPHAGE) 500 MG tablet TAKE 1 TABLET BY MOUTH TWICE DAILY WITH MEALS (LAST  TIME  FILLING)  . mometasone (NASONEX) 50 MCG/ACT nasal spray Place 2 sprays into the nose daily.  . montelukast (SINGULAIR) 10 MG tablet TAKE 1 TABLET BY MOUTH AT BEDTIME  . olmesartan (BENICAR) 20 MG tablet Take 1 tablet (20 mg total) by mouth daily.  Marland Kitchen omeprazole (PRILOSEC) 20 MG capsule Take 1 capsule (20 mg total) by mouth daily.    PHQ 2/9 Scores 03/18/2020 11/27/2019 05/17/2019 10/01/2018  PHQ - 2 Score 0 0 0 0  PHQ- 9 Score 0 0 0 0    BP Readings from Last 3 Encounters:  03/18/20 124/60  11/27/19 124/70  09/02/19 (!) 160/70    Physical Exam Vitals and nursing note reviewed.  Constitutional:      Appearance: She is well-developed.  HENT:     Head: Normocephalic.     Right Ear: Tympanic membrane, ear canal and external ear normal.     Left Ear: Tympanic membrane, ear canal and external ear normal.     Nose: Nose normal.     Mouth/Throat:     Mouth: Mucous membranes are moist.  Eyes:     General: Lids are everted, no foreign bodies appreciated. No scleral icterus.       Left eye: No foreign body or hordeolum.     Conjunctiva/sclera: Conjunctivae normal.     Right eye: Right conjunctiva is not injected.     Left eye: Left conjunctiva is not injected.     Pupils: Pupils are equal, round, and reactive to light.  Neck:     Thyroid: No thyromegaly.     Vascular: No JVD.     Trachea: No tracheal deviation.  Cardiovascular:     Rate and Rhythm: Normal rate and regular rhythm.     Heart sounds: Normal heart sounds. No murmur. No friction rub. No gallop.   Pulmonary:     Effort: Pulmonary effort is normal. No respiratory distress.     Breath sounds: Normal breath sounds. No wheezing or rales.    Abdominal:     General: Bowel sounds are normal.     Palpations: Abdomen is soft. There is no mass.     Tenderness: There is no abdominal tenderness. There is no guarding or rebound.  Musculoskeletal:        General: No tenderness. Normal range of motion.     Cervical back: Normal range of motion and neck supple.  Lymphadenopathy:     Cervical: No cervical adenopathy.  Skin:    General: Skin is warm.     Findings: No rash.  Neurological:     Mental Status: She is alert and oriented to person, place, and time.     Cranial Nerves: No cranial nerve deficit.     Deep Tendon Reflexes: Reflexes normal.  Psychiatric:        Mood and Affect:  Mood is not anxious or depressed.     Wt Readings from Last 3 Encounters:  03/18/20 184 lb (83.5 kg)  11/27/19 183 lb (83 kg)  09/02/19 194 lb 4 oz (88.1 kg)    BP 124/60   Pulse 68   Ht 5\' 5"  (1.651 m)   Wt 184 lb (83.5 kg)   BMI 30.62 kg/m   Assessment and Plan:   1. Essential hypertension .  Controlled.  Stable.  Continue amlodipine 5 mg once a day as well as Benicar 20 mg once a day.  Will check renal function panel to assess GFR. - amLODipine (NORVASC) 5 MG tablet; Take 1 tablet (5 mg total) by mouth daily.  Dispense: 90 tablet; Refill: 1 - olmesartan (BENICAR) 20 MG tablet; Take 1 tablet (20 mg total) by mouth daily.  Dispense: 90 tablet; Refill: 1 - Renal Function Panel  2. Type 2 diabetes mellitus without complication, without long-term current use of insulin (HCC) Chronic.  Controlled.  Stable.  Continue Metformin 500 mg twice a day.  Will check A1c to assess current level of control. - metFORMIN (GLUCOPHAGE) 500 MG tablet; TAKE 1 TABLET BY MOUTH TWICE DAILY WITH MEALS (LAST  TIME  FILLING)  Dispense: 180 tablet; Refill: 1 - Hemoglobin A1c  3. Acute seasonal allergic rhinitis due to pollen Chronic.  Controlled.  Stable.  Continue Nasonex nasal spray 2 puffs each nostril daily. - mometasone (NASONEX) 50 MCG/ACT nasal spray;  Place 2 sprays into the nose daily.  Dispense: 17 g; Refill: 12  4. Non-seasonal allergic rhinitis, unspecified trigger Continue Singulair 10 mg once a day. - montelukast (SINGULAIR) 10 MG tablet; Take 1 tablet (10 mg total) by mouth at bedtime.  Dispense: 90 tablet; Refill: 1  5. Gastroesophageal reflux disease .  Controlled.  Stable.  Continue omeprazole 20 mg once a day. - omeprazole (PRILOSEC) 20 MG capsule; Take 1 capsule (20 mg total) by mouth daily.  Dispense: 90 capsule; Refill: 1  6. Pure hypercholesterolemia 1.  Controlled.  Stable.  Continue dietary approach for control of cholesterol.  Will check lipid panel to assess LDL seen that the last level was mildly elevated at 101. - Lipid Panel With LDL/HDL Ratio  7. Other secondary osteoarthritis of left knee Chronic.  Persistent.  Relatively uncontrolled.  Patient continues to complain about her left knee and has been explained that is likely at the level that we are asking controlled with acetaminophen due to her allergic nature to NSAIDs.  Patient's been encouraged to pursue reevaluation by Dr. if the pain should continue.

## 2020-03-19 LAB — RENAL FUNCTION PANEL
Albumin: 4.1 g/dL (ref 3.6–4.6)
BUN/Creatinine Ratio: 25 (ref 12–28)
BUN: 17 mg/dL (ref 8–27)
CO2: 21 mmol/L (ref 20–29)
Calcium: 9.9 mg/dL (ref 8.7–10.3)
Chloride: 104 mmol/L (ref 96–106)
Creatinine, Ser: 0.69 mg/dL (ref 0.57–1.00)
GFR calc Af Amer: 91 mL/min/{1.73_m2} (ref >59)
GFR calc non Af Amer: 79 mL/min/{1.73_m2} (ref >59)
Glucose: 113 mg/dL — ABNORMAL HIGH (ref 65–99)
Phosphorus: 3.9 mg/dL (ref 3.0–4.3)
Potassium: 5.1 mmol/L (ref 3.5–5.2)
Sodium: 140 mmol/L (ref 134–144)

## 2020-03-19 LAB — LIPID PANEL WITH LDL/HDL RATIO
Cholesterol, Total: 174 mg/dL (ref 100–199)
HDL: 71 mg/dL (ref >39)
LDL Chol Calc (NIH): 94 mg/dL (ref 0–99)
LDL/HDL Ratio: 1.3 ratio (ref 0.0–3.2)
Triglycerides: 40 mg/dL (ref 0–149)
VLDL Cholesterol Cal: 9 mg/dL (ref 5–40)

## 2020-03-19 LAB — HEMOGLOBIN A1C
Est. average glucose Bld gHb Est-mCnc: 154 mg/dL
Hgb A1c MFr Bld: 7 % — ABNORMAL HIGH (ref 4.8–5.6)

## 2020-04-22 ENCOUNTER — Ambulatory Visit (INDEPENDENT_AMBULATORY_CARE_PROVIDER_SITE_OTHER): Payer: Medicare Other | Admitting: Internal Medicine

## 2020-04-22 ENCOUNTER — Encounter: Payer: Self-pay | Admitting: Internal Medicine

## 2020-04-22 ENCOUNTER — Other Ambulatory Visit: Payer: Self-pay

## 2020-04-22 VITALS — BP 128/66 | HR 58 | Ht 65.0 in | Wt 184.0 lb

## 2020-04-22 DIAGNOSIS — R001 Bradycardia, unspecified: Secondary | ICD-10-CM | POA: Diagnosis not present

## 2020-04-22 DIAGNOSIS — I493 Ventricular premature depolarization: Secondary | ICD-10-CM

## 2020-04-22 DIAGNOSIS — I4729 Other ventricular tachycardia: Secondary | ICD-10-CM

## 2020-04-22 DIAGNOSIS — I472 Ventricular tachycardia: Secondary | ICD-10-CM

## 2020-04-22 DIAGNOSIS — I10 Essential (primary) hypertension: Secondary | ICD-10-CM

## 2020-04-22 DIAGNOSIS — I471 Supraventricular tachycardia: Secondary | ICD-10-CM

## 2020-04-22 NOTE — Progress Notes (Signed)
Follow-up Outpatient Visit Date: 04/22/2020  Primary Care Provider: Duanne Limerick, MD [address] [place] [address]  Chief Complaint: Follow-up bradycardia  HPI:  [name] is a 86 y.o. female with history of hypertension and type 2 diabetes mellitus, who presents for follow-up of bradycardia.  She initially presented to [facility] in 01/2018 with dizziness after discontinuation of losartan.  She was found to be bradycardic with uncontrolled hypertension.  Subsequent monitor showed predominantly sinus rhythm with brief SVT and NSVT.  I last saw her in 08/2019, at which time she was feeling well.  She was tolerating her blood pressure regimen of amlodipine and olmesartan well, though blood pressure was suboptimally controlled.  We discussed lifestyle modifications and deferred medication changes at that time.  Today, [name] reports that she is feeling well.  She has not had any chest pain, shortness of breath, palpitations, lightheadedness, or edema.  She fell about a month ago, slipping on the wet floor.  She did not sustain any significant injuries.  She is tolerating her medications well.  She does not regularly check her blood pressure at home.  --------------------------------------------------------------------------------------------------  Cardiovascular History & Procedures: Cardiovascular Problems:  Bradycardia with paroxysmal SVT and NSVT  Risk Factors:  Hypertension, age greater than 37, and obesity  Cath/PCI:  None  CV Surgery:  None  EP Procedures and Devices:  14-day event monitor (02/07/2018):Predominantly sinus rhythm with occasional PVC's and rare PAC's. Brief runs of SVT and NSVT noted.  Non-Invasive Evaluation(s):  Pharmacologic myocardial perfusion stress test (02/07/2018): Normal study without ischemia or scar. LVEF 54%.  TTE (02/06/2018): Technically difficult study. Normal LV size. LVEF 65 to 70% with grade 1 diastolic  dysfunction and elevated filling pressure. Mild to moderate aortic regurgitation. Mild left atrial enlargement. Normal RV size and function. Mildly to moderately increased pulmonary artery pressure.  Recent CV Pertinent Labs: Lab Results  Component Value Date   CHOL 174 03/18/2020   HDL 71 03/18/2020   LDLCALC 94 03/18/2020   TRIG 40 03/18/2020   CHOLHDL 2.2 07/23/2019   INR 1.10 08/12/2013   K 5.1 03/18/2020   MG 1.9 03/28/2018   BUN 17 03/18/2020   CREATININE 0.69 03/18/2020    Past medical and surgical history were reviewed and updated in EPIC.  Current Meds  Medication Sig  . acetaminophen (TYLENOL) 325 MG tablet Take 2 tablets (650 mg total) by mouth every 6 (six) hours as needed.  Marland Kitchen amLODipine (NORVASC) 5 MG tablet Take 1 tablet (5 mg total) by mouth daily.  Marland Kitchen aspirin 81 MG tablet Take 81 mg by mouth daily.  . carbamide peroxide (DEBROX) 6.5 % OTIC solution Place 5 drops into both ears 2 (two) times daily.  . cyanocobalamin 100 MCG tablet Take 1 tablet by mouth daily.   . diclofenac sodium (VOLTAREN) 1 % GEL Apply 4 g topically 4 (four) times daily as needed.  . docusate sodium (COLACE) 50 MG capsule Take 1 capsule (50 mg total) by mouth 2 (two) times daily.  Marland Kitchen glucose blood (ONE TOUCH ULTRA TEST) test strip USE 1 STRIP TO CHECK GLUCOSE ONCE DAILY  . metFORMIN (GLUCOPHAGE) 500 MG tablet TAKE 1 TABLET BY MOUTH TWICE DAILY WITH MEALS (LAST  TIME  FILLING)  . mometasone (NASONEX) 50 MCG/ACT nasal spray Place 2 sprays into the nose daily.  . montelukast (SINGULAIR) 10 MG tablet Take 1 tablet (10 mg total) by mouth at bedtime.  Marland Kitchen olmesartan (BENICAR) 20 MG tablet Take 1 tablet (  20 mg total) by mouth daily.  Marland Kitchen omeprazole (PRILOSEC) 20 MG capsule Take 1 capsule (20 mg total) by mouth daily.    Allergies: Nsaids and Lisinopril  Social History   Tobacco Use  . Smoking status: Never Smoker  . Smokeless tobacco: Never Used  Substance Use Topics  . Alcohol use: No  . Drug  use: No    Family History  Problem Relation Age of Onset  . Diabetes Mother   . Cerebral aneurysm Father     Review of Systems: A 12-system review of systems was performed and was negative except as noted in the HPI.  --------------------------------------------------------------------------------------------------  Physical Exam: BP 128/66 (BP Location: Right Arm, Patient Position: Sitting, Cuff Size: Large)   Pulse (!) 58   Ht 5\' 5"  (1.651 m)   Wt 184 lb (83.5 kg)   SpO2 98%   BMI 30.62 kg/m   General: NAD. Neck: No JVD or HJR. Lungs: Clear to auscultation without wheezes or crackles. Heart: Bradycardic but regular with 2/6 systolic murmur.  No rubs or gallops. Abdomen: Soft, nontender, nondistended. Extremities: No lower extremity edema.  EKG: Sinus bradycardia with isolated PVC (heart rate 58 bpm).  Left bundle branch block.  No significant change from prior tracing on 09/02/2019.  Lab Results  Component Value Date   WBC 6.2 02/06/2018   HGB 11.5 (L) 02/06/2018   HCT 35.8 02/06/2018   MCV 86.5 02/06/2018   PLT 266 02/06/2018    Lab Results  Component Value Date   NA 140 03/18/2020   K 5.1 03/18/2020   CL 104 03/18/2020   CO2 21 03/18/2020   BUN 17 03/18/2020   CREATININE 0.69 03/18/2020   GLUCOSE 113 (H) 03/18/2020   ALT 12 (L) 02/05/2018    Lab Results  Component Value Date   CHOL 174 03/18/2020   HDL 71 03/18/2020   LDLCALC 94 03/18/2020   TRIG 40 03/18/2020   CHOLHDL 2.2 07/23/2019    --------------------------------------------------------------------------------------------------  ASSESSMENT AND PLAN: Bradycardia: Mild sinus bradycardia noted today.  Patient is asymptomatic.  No further intervention or work-up at this time.  Rate lowering agents such as beta-blockers and nondihydropyridine calcium channel blockers should continue to be avoided.  Hypertension: Blood pressure well controlled today continue current doses of amlodipine and  olmesartan.  PVCs, PSVT, and NSVT: Isolated PVCs noted on EKG again today without associated symptoms.  No further work-up or intervention at this time.  Follow-up: Return to clinic in 6 months.  Nelva Bush, MD 04/22/2020 2:05 PM

## 2020-04-22 NOTE — Patient Instructions (Signed)
Medication Instructions:  Your physician recommends that you continue on your current medications as directed. Please refer to the Current Medication list given to you today.  *If you need a refill on your cardiac medications before your next appointment, please call your pharmacy*   Lab Work: None ordered If you have labs (blood work) drawn today and your tests are completely normal, you will receive your results only by: Marland Kitchen MyChart Message (if you have MyChart) OR . A paper copy in the mail If you have any lab test that is abnormal or we need to change your treatment, we will call you to review the results.   Testing/Procedures: None ordered   Follow-Up: At [facility], you and your health needs are our priority.  As part of our continuing mission to provide you with exceptional heart care, we have created designated Provider Care Teams.  These Care Teams include your primary Cardiologist (physician) and Advanced Practice Providers (APPs -  Physician Assistants and Nurse Practitioners) who all work together to provide you with the care you need, when you need it.  We recommend signing up for the patient portal called "MyChart".  Sign up information is provided on this After Visit Summary.  MyChart is used to connect with patients for Virtual Visits (Telemedicine).  Patients are able to view lab/test results, encounter notes, upcoming appointments, etc.  Non-urgent messages can be sent to your provider as well.   To learn more about what you can do with MyChart, go to [email or web address].    Your next appointment:   6 month(s)  The format for your next appointment:   In Person  Provider:    You may see Dr. Okey Dupre or one of the following Advanced Practice Providers on your designated Care Team:    Nicolasa Ducking, NP  Eula Listen, PA-C  Marisue Ivan, PA-C    Other Instructions N/A

## 2020-05-11 DIAGNOSIS — M1712 Unilateral primary osteoarthritis, left knee: Secondary | ICD-10-CM | POA: Diagnosis not present

## 2020-07-14 ENCOUNTER — Telehealth: Payer: Self-pay | Admitting: Family Medicine

## 2020-07-14 NOTE — Telephone Encounter (Signed)
Pls Fu with this pt re her appt on 8/30 with Dr Yetta Barre and Health Nurse. Pt states her daughter chg jobs and she cannot keep this appt and her daughter states the only time she can bring her is on Sept 24 between 10-11 am. Pls FU at [phone] re resch

## 2020-07-20 ENCOUNTER — Ambulatory Visit: Payer: Medicare Other

## 2020-07-20 ENCOUNTER — Ambulatory Visit: Payer: Medicare Other | Admitting: Family Medicine

## 2020-08-03 ENCOUNTER — Telehealth: Payer: Self-pay

## 2020-08-03 NOTE — Chronic Care Management (AMB) (Signed)
  Chronic Care Management   Note  08/03/2020 Name: [name] MRN: [id number] DOB: [birth year]  [name] is a 86 y.o. year old female who is a primary care patient of Juline Patch, MD. I reached out to [name] by phone today in response to a referral sent by [name]'s health plan.     [name] was given information about Chronic Care Management services today including:  1. CCM service includes personalized support from designated clinical staff supervised by her physician, including individualized plan of care and coordination with other care providers 2. 24/7 contact phone numbers for assistance for urgent and routine care needs. 3. Service will only be billed when office clinical staff spend 20 minutes or more in a month to coordinate care. 4. Only one practitioner may furnish and bill the service in a calendar month. 5. The patient may stop CCM services at any time (effective at the end of the month) by phone call to the office staff. 6. The patient will be responsible for cost sharing (co-pay) of up to 20% of the service fee (after annual deductible is met).  Patient did not agree to enrollment in care management services and does not wish to consider at this time.  Follow up plan: The patient has been provided with contact information for the care management team and has been advised to call with any health related questions or concerns.   [name], [place], [place], [place] [address] Direct Dial: [phone] [email or web address]: [email or web address]

## 2020-08-12 ENCOUNTER — Telehealth: Payer: Self-pay | Admitting: Family Medicine

## 2020-08-12 NOTE — Telephone Encounter (Signed)
Copied from CRM [id number]. Topic: Clinical - COVID Pre-Screen >> Aug 12, 2020 11:40 AM Leafy Ro wrote: 1. To the best of your knowledge, have you been in close contact with anyone with a confirmed diagnosis of COVID 19?  no 2. Have you had any one or more of the following: fever, chills, cough, shortness of breath or any flu-like symptoms?  no 3. Have you been diagnosed with or have a previous diagnosis of COVID 19?  no  4. I am going to go over a few other symptoms with you. Please let me know if you are experiencing any of the following:  no Ear, nose or throat discomfort  A sore throat  Headache  Muscle pain  Diarrhea  Loss of taste or smell  If no - Continue with scheduling process; If yes - Document in scheduling notes   Thank you for answering these questions. Please know we will ask you these questions or similar questions when you arrive for your appointment and again it's how we are keeping everyone safe. Also, to keep you safe, please use the provided hand sanitizer when you enter the building. [name], we are asking everyone in the building to wear a mask because they help [place] prevent the spread of germs.   Do you have a mask of your own, if not, we are happy to provide one for you. The last thing I want to go over with you is the no visitor guidelines. This means no one can attend the appointment with you unless you need physical assistance. I understand this may be different from your past appointments and I know this may be difficult but please know if someone is driving you we are happy to call them for you once your appointment is over.

## 2020-08-14 ENCOUNTER — Ambulatory Visit: Payer: Medicare Other | Admitting: Family Medicine

## 2020-08-17 ENCOUNTER — Other Ambulatory Visit: Payer: Self-pay

## 2020-08-17 ENCOUNTER — Ambulatory Visit (INDEPENDENT_AMBULATORY_CARE_PROVIDER_SITE_OTHER): Payer: Medicare Other

## 2020-08-17 VITALS — BP 128/88 | HR 69 | Temp 97.4°F | Resp 16 | Ht 65.0 in | Wt 176.0 lb

## 2020-08-17 DIAGNOSIS — E119 Type 2 diabetes mellitus without complications: Secondary | ICD-10-CM | POA: Diagnosis not present

## 2020-08-17 DIAGNOSIS — Z23 Encounter for immunization: Secondary | ICD-10-CM | POA: Diagnosis not present

## 2020-08-17 DIAGNOSIS — Z0000 Encounter for general adult medical examination without abnormal findings: Secondary | ICD-10-CM

## 2020-08-17 NOTE — Progress Notes (Signed)
Subjective:   [name] is a 86 y.o. female who presents for an Initial Medicare Annual Wellness Visit.  Review of Systems     Cardiac Risk Factors include: advanced age (>2men, >63 women);diabetes mellitus;dyslipidemia;hypertension;sedentary lifestyle     Objective:    Today's Vitals   08/17/20 1054  BP: 128/88  Pulse: 69  Resp: 16  Temp: (!) 97.4 F (36.3 C)  TempSrc: Oral  SpO2: 98%  Weight: 176 lb (79.8 kg)  Height: 5\' 5"  (1.651 m)  PainSc: 4    Body mass index is 29.29 kg/m.  Advanced Directives 02/05/2018 02/05/2018 08/19/2013 08/12/2013  Does Patient Have a Medical Advance Directive? No No Patient does not have advance directive;Patient would not like information Patient does not have advance directive;Patient would not like information  Would patient like information on creating a medical advance directive? No - Patient declined No - Patient declined - -  Pre-existing out of facility DNR order (yellow form or pink MOST form) - - - No    Current Medications (verified) Outpatient Encounter Medications as of 08/17/2020  Medication Sig  . acetaminophen (TYLENOL) 325 MG tablet Take 2 tablets (650 mg total) by mouth every 6 (six) hours as needed.  08/19/2020 aspirin 81 MG tablet Take 81 mg by mouth daily.  . carbamide peroxide (DEBROX) 6.5 % OTIC solution Place 5 drops into both ears 2 (two) times daily.  . clobetasol (TEMOVATE) 0.05 % external solution APPLY TOPICALLY 2 TIMES A DAY FOR SPOT TREATMENT  . cyanocobalamin 100 MCG tablet Take 1 tablet by mouth daily.   . diclofenac (VOLTAREN) 50 MG EC tablet Take 50 mg by mouth 2 (two) times daily as needed.  . diclofenac sodium (VOLTAREN) 1 % GEL Apply 4 g topically 4 (four) times daily as needed.  . docusate sodium (COLACE) 50 MG capsule Take 1 capsule (50 mg total) by mouth 2 (two) times daily.  Marland Kitchen glucose blood (ONE TOUCH ULTRA TEST) test strip USE 1 STRIP TO CHECK GLUCOSE ONCE DAILY  . metFORMIN (GLUCOPHAGE) 500 MG tablet  TAKE 1 TABLET BY MOUTH TWICE DAILY WITH MEALS (LAST  TIME  FILLING)  . mometasone (NASONEX) 50 MCG/ACT nasal spray Place 2 sprays into the nose daily.  . montelukast (SINGULAIR) 10 MG tablet Take 1 tablet (10 mg total) by mouth at bedtime.  Marland Kitchen olmesartan (BENICAR) 20 MG tablet Take 1 tablet (20 mg total) by mouth daily.  Marland Kitchen omeprazole (PRILOSEC) 20 MG capsule Take 1 capsule (20 mg total) by mouth daily.  Marland Kitchen amLODipine (NORVASC) 5 MG tablet Take 1 tablet (5 mg total) by mouth daily.   No facility-administered encounter medications on file as of 08/17/2020.    Allergies (verified) Nsaids and Lisinopril   History: Past Medical History:  Diagnosis Date  . GERD (gastroesophageal reflux disease)   . Hypertension   . Right knee DJD   . Type II diabetes mellitus (HCC)    Past Surgical History:  Procedure Laterality Date  . BACK SURGERY    . CATARACT EXTRACTION Bilateral 03/2016  . CERVICAL DISC SURGERY  12/28/2010   "bulging disc; went in on the left side of my neck" (08/19/2013)  . JOINT REPLACEMENT    . TOTAL KNEE ARTHROPLASTY Left 03/28/2011   Dr 05/28/2011  . TOTAL KNEE ARTHROPLASTY Right 08/19/2013  . TOTAL KNEE ARTHROPLASTY Right 08/19/2013   Procedure: TOTAL KNEE ARTHROPLASTY;  Surgeon: 08/21/2013, MD;  Location: [facility] OR;  Service: Orthopedics;  Laterality: Right;   Family  History  Problem Relation Age of Onset  . Diabetes Mother   . Cerebral aneurysm Father    Social History   Socioeconomic History  . Marital status: Widowed    Spouse name: Not on file  . Number of children: 3  . Years of education: Not on file  . Highest education level: Not on file  Occupational History  . Not on file  Tobacco Use  . Smoking status: Never Smoker  . Smokeless tobacco: Never Used  Vaping Use  . Vaping Use: Never used  Substance and Sexual Activity  . Alcohol use: No  . Drug use: No  . Sexual activity: Never  Other Topics Concern  . Not on file  Social History Narrative   Pt lives  alone; does not drive   Social Determinants of Health   Financial Resource Strain: Low Risk   . Difficulty of Paying Living Expenses: Not hard at all  Food Insecurity: No Food Insecurity  . Worried About Programme researcher, broadcasting/film/video in the Last Year: Never true  . Ran Out of Food in the Last Year: Never true  Transportation Needs: No Transportation Needs  . Lack of Transportation (Medical): No  . Lack of Transportation (Non-Medical): No  Physical Activity: Inactive  . Days of Exercise per Week: 0 days  . Minutes of Exercise per Session: 0 min  Stress: No Stress Concern Present  . Feeling of Stress : Not at all  Social Connections: Moderately Isolated  . Frequency of Communication with Friends and Family: More than three times a week  . Frequency of Social Gatherings with Friends and Family: More than three times a week  . Attends Religious Services: More than 4 times per year  . Active Member of Clubs or Organizations: No  . Attends Banker Meetings: Never  . Marital Status: Widowed    Tobacco Counseling Counseling given: Not Answered   Clinical Intake:  Pre-visit preparation completed: Yes  Pain : 0-10 (left knee stiffness) Pain Score: 4  Pain Type: Chronic pain Pain Location: Knee Pain Orientation: Left Pain Descriptors / Indicators: Aching, Sore, Other (Comment) (stiffness) Pain Onset: More than a month ago Pain Frequency: Constant     BMI - recorded: 29.29 Nutritional Status: BMI 25 -29 Overweight Nutritional Risks: None Diabetes: Yes CBG done?: No Did pt. bring in CBG monitor from home?: No  How often do you need to have someone help you when you read instructions, pamphlets, or other written materials from your doctor or pharmacy?: 1 - Never  Nutrition Risk Assessment:  Has the patient had any N/V/D within the last 2 months?  No  Does the patient have any non-healing wounds?  No  Has the patient had any unintentional weight loss or weight gain?  No     Diabetes:  Is the patient diabetic?  Yes  If diabetic, was a CBG obtained today?  No  Did the patient bring in their glucometer from home?  No  How often do you monitor your CBG's? daily.   Financial Strains and Diabetes Management:  Are you having any financial strains with the device, your supplies or your medication? No .  Does the patient want to be seen by Chronic Care Management for management of their diabetes?  No  Would the patient like to be referred to a Nutritionist or for Diabetic Management?  No   Diabetic Exams:  Diabetic Eye Exam: Completed 07/15/19. Overdue for diabetic eye exam. Pt has been advised about  the importance in completing this exam. .  Diabetic Foot Exam: Completed 10/01/18. Pt has been advised about the importance in completing this exam.   Interpreter Needed?: No  Information entered by :: Reather Littler LPN   Activities of Daily Living In your present state of health, do you have any difficulty performing the following activities: 08/17/2020  Hearing? N  Comment declines hearing aids  Vision? N  Difficulty concentrating or making decisions? N  Walking or climbing stairs? Y  Dressing or bathing? N  Doing errands, shopping? Y  Preparing Food and eating ? N  Using the Toilet? N  In the past six months, have you accidently leaked urine? N  Do you have problems with loss of bowel control? N  Managing your Medications? N  Managing your Finances? N  Housekeeping or managing your Housekeeping? N  Some recent data might be hidden    Patient Care Team: Duanne Limerick, MD as PCP - General (Family Medicine)  Indicate any recent Medical Services you may have received from other than [facility] providers in the past year (date may be approximate).     Assessment:   This is a routine wellness examination for [name].  Hearing/Vision screen  Hearing Screening   125Hz  250Hz  500Hz  1000Hz  2000Hz  3000Hz  4000Hz  6000Hz  8000Hz   Right ear:           Left ear:            Comments: Pt declines hearing difficulty  Vision Screening Comments: Annual vision screenings done by Dr.  Dietary issues and exercise activities discussed: Current Exercise Habits: The patient does not participate in regular exercise at present, Exercise limited by: orthopedic condition(s)  Goals    . Prevent falls     Prevent falls with safety devices in home as well as balance and strength training      Depression Screen PHQ 2/9 Scores 08/17/2020 03/18/2020 11/27/2019 05/17/2019 10/01/2018 06/23/2017 05/10/2016  PHQ - 2 Score 0 0 0 0 0 0 0  PHQ- 9 Score - 0 0 0 0 - -    Fall Risk Fall Risk  08/17/2020 03/18/2020 05/17/2019 10/01/2018 06/23/2017  Falls in the past year? 0 0 0 1 No  Number falls in past yr: 0 - - 0 -  Injury with Fall? 0 - - 0 -  Risk for fall due to : Impaired mobility;Impaired balance/gait;Orthopedic patient - - Impaired balance/gait -  Follow up Falls prevention discussed Falls evaluation completed Falls evaluation completed Falls evaluation completed -    Any stairs in or around the home? No  If so, are there any without handrails? No  Home free of loose throw rugs in walkways, pet beds, electrical cords, etc? Yes  Adequate lighting in your home to reduce risk of falls? Yes   ASSISTIVE DEVICES UTILIZED TO PREVENT FALLS:  Life alert? No  Use of a cane, walker or w/c? Yes  Grab bars in the bathroom? No  Shower chair or bench in shower? Yes  Elevated toilet seat or a handicapped toilet? Yes   TIMED UP AND GO:  Was the test performed? Yes .  Length of time to ambulate 10 feet: 15 sec.   Gait slow and steady with assistive device  Cognitive Function:     6CIT Screen 08/17/2020  What Year? 0 points  What month? 0 points  What time? 0 points  Count back from 20 0 points  Months in reverse 4 points  Repeat phrase 10 points  Total Score 14    Immunizations Immunization History  Administered Date(s) Administered  . Fluad Quad(high Dose 65+)  07/23/2019, 08/17/2020  . Influenza, High Dose Seasonal PF 10/01/2018  . PFIZER SARS-COV-2 Vaccination 01/29/2020, 02/19/2020  . Pneumococcal Conjugate-13 09/24/2015  . Pneumococcal Polysaccharide-23 03/28/2013  . Pneumococcal-Unspecified 03/28/2013  . Tdap 05/10/2016    TDAP status: Up to date   Flu Vaccine status: Completed at today's visit   Pneumococcal vaccine status: Up to date   Covid-19 vaccine status: Completed vaccines  Qualifies for Shingles Vaccine? Yes   Zostavax completed No   Shingrix Completed?: No.    Education has been provided regarding the importance of this vaccine. Patient has been advised to call insurance company to determine out of pocket expense if they have not yet received this vaccine. Advised may also receive vaccine at local pharmacy or Health Dept. Verbalized acceptance and understanding.  Screening Tests Health Maintenance  Topic Date Due  . FOOT EXAM  10/02/2019  . OPHTHALMOLOGY EXAM  07/04/2020  . DEXA SCAN  03/18/2021 (Originally 01/18/1999)  . HEMOGLOBIN A1C  09/17/2020  . TETANUS/TDAP  05/10/2026  . INFLUENZA VACCINE  Completed  . COVID-19 Vaccine  Completed  . PNA vac Low Risk Adult  Completed    Health Maintenance  Health Maintenance Due  Topic Date Due  . FOOT EXAM  10/02/2019  . OPHTHALMOLOGY EXAM  07/04/2020    Colorectal cancer screening: No longer required.    Mammogram status: No longer required.    Bone Density status: pt declined   Lung Cancer Screening: (Low Dose CT Chest recommended if Age 34-80 years, 30 pack-year currently smoking OR have quit w/in 15years.) does not qualify.   Additional Screening:  Hepatitis C Screening: does not qualify  Vision Screening: Recommended annual ophthalmology exams for early detection of glaucoma and other disorders of the eye. Is the patient up to date with their annual eye exam?  No  Who is the provider or what is the name of the office in which the patient attends annual eye  exams? Dr. Alvester MorinBell  Dental Screening: Recommended annual dental exams for proper oral hygiene  Community Resource Referral / Chronic Care Management: CRR required this visit?  No   CCM required this visit?  No      Plan:     I have personally reviewed and noted the following in the patient's chart:   . Medical and social history . Use of alcohol, tobacco or illicit drugs  . Current medications and supplements . Functional ability and status . Nutritional status . Physical activity . Advanced directives . List of other physicians . Hospitalizations, surgeries, and ER visits in previous 12 months . Vitals . Screenings to include cognitive, depression, and falls . Referrals and appointments  In addition, I have reviewed and discussed with patient certain preventive protocols, quality metrics, and best practice recommendations. A written personalized care plan for preventive services as well as general preventive health recommendations were provided to patient.     Reather LittlerKasey Michaelanthony Kempton, LPN   [id number]/[id number]/27/2021   Nurse Notes: Pt accompanied to visit today by her daughter [name]. Pt c/o stiffness in left knee; s/p knee replacement in 2012 and was told by ortho it was arthritis and everything looks okay. Pt also c/o difficulty moving neck due to hx of surgery for bulging disc. A1c ordered today per Delice Bisonara and patient advised to schedule 6 month follow up with Dr. Yetta BarreJones.

## 2020-08-17 NOTE — Patient Instructions (Signed)
[name] , Thank you for taking time to come for your Medicare Wellness Visit. I appreciate your ongoing commitment to your health goals. Please review the following plan we discussed and let me know if I can assist you in the future.   Screening recommendations/referrals: Colonoscopy: no longer required Mammogram: no longer required Bone Density: no longer required Recommended yearly ophthalmology/optometry visit for glaucoma screening and checkup Recommended yearly dental visit for hygiene and checkup  Vaccinations: Influenza vaccine: done today Pneumococcal vaccine: done 09/24/15 Tdap vaccine: done 05/10/16 Shingles vaccine: Shingrix discussed. Please contact your pharmacy for coverage information.  Covid-19: done 01/29/20 & 02/19/20  Advanced directives: Advance directive discussed with you today. I have provided a copy for you to complete at home and have notarized. Once this is complete please bring a copy in to our office so we can scan it into your chart.  Conditions/risks identified: Recommend continuing to prevent falls  Next appointment: Follow up in one year for your annual wellness visit    Preventive Care 65 Years and Older, Female Preventive care refers to lifestyle choices and visits with your health care provider that can promote health and wellness. What does preventive care include?  A yearly physical exam. This is also called an annual well check.  Dental exams once or twice a year.  Routine eye exams. Ask your health care provider how often you should have your eyes checked.  Personal lifestyle choices, including:  Daily care of your teeth and gums.  Regular physical activity.  Eating a healthy diet.  Avoiding tobacco and drug use.  Limiting alcohol use.  Practicing safe sex.  Taking low-dose aspirin every day.  Taking vitamin and mineral supplements as recommended by your health care provider. What happens during an annual well check? The  services and screenings done by your health care provider during your annual well check will depend on your age, overall health, lifestyle risk factors, and family history of disease. Counseling  Your health care provider may ask you questions about your:  Alcohol use.  Tobacco use.  Drug use.  Emotional well-being.  Home and relationship well-being.  Sexual activity.  Eating habits.  History of falls.  Memory and ability to understand (cognition).  Work and work Astronomer.  Reproductive health. Screening  You may have the following tests or measurements:  Height, weight, and BMI.  Blood pressure.  Lipid and cholesterol levels. These may be checked every 5 years, or more frequently if you are over 26 years old.  Skin check.  Lung cancer screening. You may have this screening every year starting at age 36 if you have a 30-pack-year history of smoking and currently smoke or have quit within the past 15 years.  Fecal occult blood test (FOBT) of the stool. You may have this test every year starting at age 43.  Flexible sigmoidoscopy or colonoscopy. You may have a sigmoidoscopy every 5 years or a colonoscopy every 10 years starting at age 3.  Hepatitis C blood test.  Hepatitis B blood test.  Sexually transmitted disease (STD) testing.  Diabetes screening. This is done by checking your blood sugar (glucose) after you have not eaten for a while (fasting). You may have this done every 1-3 years.  Bone density scan. This is done to screen for osteoporosis. You may have this done starting at age 42.  Mammogram. This may be done every 1-2 years. Talk to your health care provider about how often you should have regular mammograms. Talk  with your health care provider about your test results, treatment options, and if necessary, the need for more tests. Vaccines  Your health care provider may recommend certain vaccines, such as:  Influenza vaccine. This is recommended  every year.  Tetanus, diphtheria, and acellular pertussis (Tdap, Td) vaccine. You may need a Td booster every 10 years.  Zoster vaccine. You may need this after age 73.  Pneumococcal 13-valent conjugate (PCV13) vaccine. One dose is recommended after age 29.  Pneumococcal polysaccharide (PPSV23) vaccine. One dose is recommended after age 54. Talk to your health care provider about which screenings and vaccines you need and how often you need them. This information is not intended to replace advice given to you by your health care provider. Make sure you discuss any questions you have with your health care provider. Document Released: 12/04/2015 Document Revised: 07/27/2016 Document Reviewed: 09/08/2015 Elsevier Interactive Patient Education  2017 [employer].  Fall Prevention in the Home Falls can cause injuries. They can happen to people of all ages. There are many things you can do to make your home safe and to help prevent falls. What can I do on the outside of my home?  Regularly fix the edges of walkways and driveways and fix any cracks.  Remove anything that might make you trip as you walk through a door, such as a raised step or threshold.  Trim any bushes or trees on the path to your home.  Use bright outdoor lighting.  Clear any walking paths of anything that might make someone trip, such as rocks or tools.  Regularly check to see if handrails are loose or broken. Make sure that both sides of any steps have handrails.  Any raised decks and porches should have guardrails on the edges.  Have any leaves, snow, or ice cleared regularly.  Use sand or salt on walking paths during winter.  Clean up any spills in your garage right away. This includes oil or grease spills. What can I do in the bathroom?  Use night lights.  Install grab bars by the toilet and in the tub and shower. Do not use towel bars as grab bars.  Use non-skid mats or decals in the tub or shower.  If  you need to sit down in the shower, use a plastic, non-slip stool.  Keep the floor dry. Clean up any water that spills on the floor as soon as it happens.  Remove soap buildup in the tub or shower regularly.  Attach bath mats securely with double-sided non-slip rug tape.  Do not have throw rugs and other things on the floor that can make you trip. What can I do in the bedroom?  Use night lights.  Make sure that you have a light by your bed that is easy to reach.  Do not use any sheets or blankets that are too big for your bed. They should not hang down onto the floor.  Have a firm chair that has side arms. You can use this for support while you get dressed.  Do not have throw rugs and other things on the floor that can make you trip. What can I do in the kitchen?  Clean up any spills right away.  Avoid walking on wet floors.  Keep items that you use a lot in easy-to-reach places.  If you need to reach something above you, use a strong step stool that has a grab bar.  Keep electrical cords out of the way.  Do  not use floor polish or wax that makes floors slippery. If you must use wax, use non-skid floor wax.  Do not have throw rugs and other things on the floor that can make you trip. What can I do with my stairs?  Do not leave any items on the stairs.  Make sure that there are handrails on both sides of the stairs and use them. Fix handrails that are broken or loose. Make sure that handrails are as long as the stairways.  Check any carpeting to make sure that it is firmly attached to the stairs. Fix any carpet that is loose or worn.  Avoid having throw rugs at the top or bottom of the stairs. If you do have throw rugs, attach them to the floor with carpet tape.  Make sure that you have a light switch at the top of the stairs and the bottom of the stairs. If you do not have them, ask someone to add them for you. What else can I do to help prevent falls?  Wear shoes  that:  Do not have high heels.  Have rubber bottoms.  Are comfortable and fit you well.  Are closed at the toe. Do not wear sandals.  If you use a stepladder:  Make sure that it is fully opened. Do not climb a closed stepladder.  Make sure that both sides of the stepladder are locked into place.  Ask someone to hold it for you, if possible.  Clearly mark and make sure that you can see:  Any grab bars or handrails.  First and last steps.  Where the edge of each step is.  Use tools that help you move around (mobility aids) if they are needed. These include:  Canes.  Walkers.  Scooters.  Crutches.  Turn on the lights when you go into a dark area. Replace any light bulbs as soon as they burn out.  Set up your furniture so you have a clear path. Avoid moving your furniture around.  If any of your floors are uneven, fix them.  If there are any pets around you, be aware of where they are.  Review your medicines with your doctor. Some medicines can make you feel dizzy. This can increase your chance of falling. Ask your doctor what other things that you can do to help prevent falls. This information is not intended to replace advice given to you by your health care provider. Make sure you discuss any questions you have with your health care provider. Document Released: 09/03/2009 Document Revised: 04/14/2016 Document Reviewed: 12/12/2014 Elsevier Interactive Patient Education  2017 [employer].

## 2020-08-18 LAB — HEMOGLOBIN A1C

## 2020-08-25 ENCOUNTER — Telehealth: Payer: Self-pay

## 2020-08-25 NOTE — Telephone Encounter (Signed)
Pt will come in for redraw A1C downstairs lab

## 2020-08-25 NOTE — Telephone Encounter (Signed)
Copied from CRM [id number]. Topic: General - Other >> Aug 25, 2020 12:14 PM Tamela Oddi wrote: Reason for CRM: Patient called to speak with Delice Bison regarding her blood test results.  She stated she never got a report.  Please call patient to discuss at [phone]

## 2020-10-23 ENCOUNTER — Other Ambulatory Visit: Payer: Self-pay

## 2020-10-23 ENCOUNTER — Ambulatory Visit (INDEPENDENT_AMBULATORY_CARE_PROVIDER_SITE_OTHER): Payer: Medicare Other | Admitting: Internal Medicine

## 2020-10-23 ENCOUNTER — Encounter: Payer: Self-pay | Admitting: Internal Medicine

## 2020-10-23 VITALS — BP 112/62 | HR 62 | Ht 65.0 in | Wt 185.0 lb

## 2020-10-23 DIAGNOSIS — I472 Ventricular tachycardia: Secondary | ICD-10-CM

## 2020-10-23 DIAGNOSIS — R001 Bradycardia, unspecified: Secondary | ICD-10-CM | POA: Diagnosis not present

## 2020-10-23 DIAGNOSIS — I493 Ventricular premature depolarization: Secondary | ICD-10-CM

## 2020-10-23 DIAGNOSIS — I471 Supraventricular tachycardia: Secondary | ICD-10-CM

## 2020-10-23 DIAGNOSIS — I10 Essential (primary) hypertension: Secondary | ICD-10-CM | POA: Diagnosis not present

## 2020-10-23 DIAGNOSIS — I4729 Other ventricular tachycardia: Secondary | ICD-10-CM

## 2020-10-23 DIAGNOSIS — I351 Nonrheumatic aortic (valve) insufficiency: Secondary | ICD-10-CM | POA: Insufficient documentation

## 2020-10-23 NOTE — Progress Notes (Signed)
Follow-up Outpatient Visit Date: 10/23/2020  Primary Care Provider: Duanne Limerick, MD [address] [place] [address]  Chief Complaint: Follow-up bradycardia  HPI:  [name] is a 86 y.o. female with history of hypertension and type 2 diabetes mellitus, who presents for follow-up of bradycardia.  I last saw her in June, at which time she was doing well other than a recent mechanical fall after slipping on a wet floor.  She did not sustain any significant injuries.  No medication changes or additional testing were pursued.  Today, [name] reports that she has been feeling well without dizziness/lightheadedness, chest pain, shortness of breath, and palpitations.  He has intermittent dependent leg edema.  She does not check her BP at home.  She has not fallen since our last visit.  She is tolerating her medications well.  --------------------------------------------------------------------------------------------------  Cardiovascular History & Procedures: Cardiovascular Problems:  Bradycardia with paroxysmal SVT and NSVT  Risk Factors:  Hypertension, age greater than 17, and obesity  Cath/PCI:  None  CV Surgery:  None  EP Procedures and Devices:  14-day event monitor (02/07/2018):Predominantly sinus rhythm with occasional PVC's and rare PAC's. Brief runs of SVT and NSVT noted.  Non-Invasive Evaluation(s):  Pharmacologic myocardial perfusion stress test (02/07/2018): Normal study without ischemia or scar. LVEF 54%.  TTE (02/06/2018): Technically difficult study. Normal LV size. LVEF 65 to 70% with grade 1 diastolic dysfunction and elevated filling pressure. Mild to moderate aortic regurgitation. Mild left atrial enlargement. Normal RV size and function. Mildly to moderately increased pulmonary artery pressure.  Recent CV Pertinent Labs: Lab Results  Component Value Date   CHOL 174 03/18/2020   HDL 71 03/18/2020   LDLCALC 94 03/18/2020     TRIG 40 03/18/2020   CHOLHDL 2.2 07/23/2019   INR 1.10 08/12/2013   K 5.1 03/18/2020   MG 1.9 03/28/2018   BUN 17 03/18/2020   CREATININE 0.69 03/18/2020    Past medical and surgical history were reviewed and updated in EPIC.  Current Meds  Medication Sig  . acetaminophen (TYLENOL) 325 MG tablet Take 2 tablets (650 mg total) by mouth every 6 (six) hours as needed.  Marland Kitchen amLODipine (NORVASC) 5 MG tablet Take 1 tablet (5 mg total) by mouth daily.  . carbamide peroxide (DEBROX) 6.5 % OTIC solution Place 5 drops into both ears 2 (two) times daily.  . clobetasol (TEMOVATE) 0.05 % external solution APPLY TOPICALLY 2 TIMES A DAY FOR SPOT TREATMENT  . cyanocobalamin 100 MCG tablet Take 1 tablet by mouth daily.   . diclofenac (VOLTAREN) 50 MG EC tablet Take 50 mg by mouth 2 (two) times daily as needed.  . diclofenac sodium (VOLTAREN) 1 % GEL Apply 4 g topically 4 (four) times daily as needed.  . docusate sodium (COLACE) 50 MG capsule Take 1 capsule (50 mg total) by mouth 2 (two) times daily.  Marland Kitchen glucose blood (ONE TOUCH ULTRA TEST) test strip USE 1 STRIP TO CHECK GLUCOSE ONCE DAILY  . metFORMIN (GLUCOPHAGE) 500 MG tablet TAKE 1 TABLET BY MOUTH TWICE DAILY WITH MEALS (LAST  TIME  FILLING)  . mometasone (NASONEX) 50 MCG/ACT nasal spray Place 2 sprays into the nose daily.  . montelukast (SINGULAIR) 10 MG tablet Take 1 tablet (10 mg total) by mouth at bedtime.  Marland Kitchen olmesartan (BENICAR) 20 MG tablet Take 1 tablet (20 mg total) by mouth daily.  Marland Kitchen omeprazole (PRILOSEC) 20 MG capsule Take 1 capsule (20 mg total) by mouth daily.  . [  DISCONTINUED] aspirin 81 MG tablet Take 81 mg by mouth daily.    Allergies: Nsaids and Lisinopril  Social History   Tobacco Use  . Smoking status: Never Smoker  . Smokeless tobacco: Never Used  Vaping Use  . Vaping Use: Never used  Substance Use Topics  . Alcohol use: No  . Drug use: No    Family History  Problem Relation Age of Onset  . Diabetes Mother   .  Cerebral aneurysm Father     Review of Systems: A 12-system review of systems was performed and was negative except as noted in the HPI.  --------------------------------------------------------------------------------------------------  Physical Exam: BP 112/62   Pulse 62   Ht 5\' 5"  (1.651 m)   Wt 185 lb (83.9 kg)   BMI 30.79 kg/m   General:  NAD. HEENT: No conjunctival pallor or scleral icterus. Facemask in place. Neck: Supple without lymphadenopathy, thyromegaly, JVD, or HJR. Lungs: Normal work of breathing. Clear to auscultation bilaterally without wheezes or crackles. Heart: Regular rate and rhythm with 2/6 systolic murmur and occasional extrasystoles. Abd: Bowel sounds present. Soft, NT/ND without hepatosplenomegaly Ext: 1+ pretibial edema bilaterally.   EKG:  NSR with LBBB and occasional PVC's.  PVC's are new since 04/22/2020.  Otherwise, there has been no significant interval change.  Lab Results  Component Value Date   WBC 6.2 02/06/2018   HGB 11.5 (L) 02/06/2018   HCT 35.8 02/06/2018   MCV 86.5 02/06/2018   PLT 266 02/06/2018    Lab Results  Component Value Date   NA 140 03/18/2020   K 5.1 03/18/2020   CL 104 03/18/2020   CO2 21 03/18/2020   BUN 17 03/18/2020   CREATININE 0.69 03/18/2020   GLUCOSE 113 (H) 03/18/2020   ALT 12 (L) 02/05/2018    Lab Results  Component Value Date   CHOL 174 03/18/2020   HDL 71 03/18/2020   LDLCALC 94 03/18/2020   TRIG 40 03/18/2020   CHOLHDL 2.2 07/23/2019    --------------------------------------------------------------------------------------------------  ASSESSMENT AND PLAN: Bradycardia: No symptoms reported.  EKG shows normal sinus rhythm with chronic LBBB and occasional PVC's.  No further intervention planned at this time.  Continue to avoid rate controlling agents.  Valvular heart disease with aortic insufficiency: Echo in 2019 notable for mild to moderate aortic regurgitation.  [name] is asymptomatic  other than occasional dependent edema, which is evident on exam today.  We will plan to repeat an echocardiogram shortly before our follow-up visit in 1 year.  She should let Darrold Span know if any new symptoms develop in the meantime.  Hypertension: Blood pressure well-controlled today.  No medications at this time.  PVC's, PSVT, and NSVT: Occasional PVC's noted on EKG today - asymptomatic.  Defer further workup/treatment at this time other than follow-up echo, as detailed above.  Follow-up: Return to clinic in 1 year.  [place], MD 10/23/2020 1:47 PM

## 2020-10-23 NOTE — Patient Instructions (Signed)
Medication Instructions:   Your physician recommends that you continue on your current medications as directed. Please refer to the Current Medication list given to you today.  *If you need a refill on your cardiac medications before your next appointment, please call your pharmacy*   Lab Work: None Ordered If you have labs (blood work) drawn today and your tests are completely normal, you will receive your results only by: Marland Kitchen MyChart Message (if you have MyChart) OR . A paper copy in the mail If you have any lab test that is abnormal or we need to change your treatment, we will call you to review the results.   Testing/Procedures:  Your physician has requested that you have an echocardiogram just prior to your 1 year follow up. Echocardiography is a painless test that uses sound waves to create images of your heart. It provides your doctor with information about the size and shape of your heart and how well your heart's chambers and valves are working. This procedure takes approximately one hour. There are no restrictions for this procedure.   Follow-Up: At [facility], you and your health needs are our priority.  As part of our continuing mission to provide you with exceptional heart care, we have created designated Provider Care Teams.  These Care Teams include your primary Cardiologist (physician) and Advanced Practice Providers (APPs -  Physician Assistants and Nurse Practitioners) who all work together to provide you with the care you need, when you need it.  We recommend signing up for the patient portal called "MyChart".  Sign up information is provided on this After Visit Summary.  MyChart is used to connect with patients for Virtual Visits (Telemedicine).  Patients are able to view lab/test results, encounter notes, upcoming appointments, etc.  Non-urgent messages can be sent to your provider as well.   To learn more about what you can do with MyChart, go to  [email or web address].    Your next appointment:   1 year(s)  The format for your next appointment:   In Person  Provider:   Yvonne Kendall, MD   Other Instructions

## 2020-11-03 ENCOUNTER — Telehealth: Payer: Self-pay

## 2020-11-03 NOTE — Telephone Encounter (Unsigned)
Copied from CRM [id number]. Topic: General - Other >> Nov 03, 2020  2:58 PM Wyonia Hough E wrote: Reason for CRM: Pt would like to speak with Delice Bison about the covid vaccine/ please advise

## 2020-11-03 NOTE — Telephone Encounter (Signed)
Spoke to pt- she is hesitant to get the 3rd shot

## 2020-11-22 ENCOUNTER — Other Ambulatory Visit: Payer: Self-pay | Admitting: Family Medicine

## 2020-11-22 DIAGNOSIS — I10 Essential (primary) hypertension: Secondary | ICD-10-CM

## 2020-11-23 ENCOUNTER — Telehealth: Payer: Self-pay

## 2020-11-23 ENCOUNTER — Other Ambulatory Visit: Payer: Self-pay

## 2020-11-23 DIAGNOSIS — K219 Gastro-esophageal reflux disease without esophagitis: Secondary | ICD-10-CM

## 2020-11-23 DIAGNOSIS — I10 Essential (primary) hypertension: Secondary | ICD-10-CM

## 2020-11-23 DIAGNOSIS — J3089 Other allergic rhinitis: Secondary | ICD-10-CM

## 2020-11-23 DIAGNOSIS — E119 Type 2 diabetes mellitus without complications: Secondary | ICD-10-CM

## 2020-11-23 MED ORDER — AMLODIPINE BESYLATE 5 MG PO TABS: 5.0000 mg | ORAL_TABLET | Freq: Every day | ORAL | 0 refills | Status: DC

## 2020-11-23 MED ORDER — OLMESARTAN MEDOXOMIL 20 MG PO TABS: 20.0000 mg | ORAL_TABLET | Freq: Every day | ORAL | 0 refills | Status: DC

## 2020-11-23 MED ORDER — MONTELUKAST SODIUM 10 MG PO TABS: 10.0000 mg | ORAL_TABLET | Freq: Every day | ORAL | 0 refills | Status: DC

## 2020-11-23 MED ORDER — OMEPRAZOLE 20 MG PO CPDR: 20.0000 mg | DELAYED_RELEASE_CAPSULE | Freq: Every day | ORAL | 0 refills | Status: DC

## 2020-11-23 MED ORDER — METFORMIN HCL 500 MG PO TABS: ORAL_TABLET | ORAL | 0 refills | Status: DC

## 2020-11-23 NOTE — Telephone Encounter (Unsigned)
Copied from CRM [id number]. Topic: General - Inquiry >> Nov 23, 2020  9:25 AM Adrian Prince D wrote: Reason for CRM: Patient would like a call back from Dr. Yetta Barre nurse. She can be reached at [phone]. Please advise

## 2021-01-19 ENCOUNTER — Other Ambulatory Visit: Payer: Self-pay

## 2021-01-19 ENCOUNTER — Telehealth: Payer: Self-pay

## 2021-01-19 DIAGNOSIS — I10 Essential (primary) hypertension: Secondary | ICD-10-CM

## 2021-01-19 MED ORDER — AMLODIPINE BESYLATE 5 MG PO TABS: 5.0000 mg | ORAL_TABLET | Freq: Every day | ORAL | 0 refills | Status: DC

## 2021-01-19 NOTE — Telephone Encounter (Signed)
Copied from CRM [id number]. Topic: General - Other >> Jan 19, 2021 10:17 AM Tamela Oddi wrote: Reason for CRM: Patient called to speak with Delice Bison regarding her medication.  Please call at [phone]

## 2021-01-19 NOTE — Progress Notes (Unsigned)
Sent in amlodipine

## 2021-01-19 NOTE — Telephone Encounter (Signed)
Sent in Amlodipine 

## 2021-02-04 ENCOUNTER — Encounter: Payer: Self-pay | Admitting: Family Medicine

## 2021-02-04 ENCOUNTER — Ambulatory Visit (INDEPENDENT_AMBULATORY_CARE_PROVIDER_SITE_OTHER): Payer: Medicare Other | Admitting: Family Medicine

## 2021-02-04 ENCOUNTER — Other Ambulatory Visit: Payer: Self-pay

## 2021-02-04 VITALS — BP 130/84 | HR 68 | Ht 65.0 in | Wt 169.0 lb

## 2021-02-04 DIAGNOSIS — G8929 Other chronic pain: Secondary | ICD-10-CM

## 2021-02-04 DIAGNOSIS — E7800 Pure hypercholesterolemia, unspecified: Secondary | ICD-10-CM

## 2021-02-04 DIAGNOSIS — J3089 Other allergic rhinitis: Secondary | ICD-10-CM | POA: Diagnosis not present

## 2021-02-04 DIAGNOSIS — J301 Allergic rhinitis due to pollen: Secondary | ICD-10-CM | POA: Diagnosis not present

## 2021-02-04 DIAGNOSIS — K219 Gastro-esophageal reflux disease without esophagitis: Secondary | ICD-10-CM | POA: Diagnosis not present

## 2021-02-04 DIAGNOSIS — E119 Type 2 diabetes mellitus without complications: Secondary | ICD-10-CM | POA: Diagnosis not present

## 2021-02-04 DIAGNOSIS — M25562 Pain in left knee: Secondary | ICD-10-CM | POA: Diagnosis not present

## 2021-02-04 DIAGNOSIS — I10 Essential (primary) hypertension: Secondary | ICD-10-CM

## 2021-02-04 MED ORDER — MONTELUKAST SODIUM 10 MG PO TABS: 10.0000 mg | ORAL_TABLET | Freq: Every day | ORAL | 1 refills | Status: DC

## 2021-02-04 MED ORDER — MOMETASONE FUROATE 50 MCG/ACT NA SUSP: 2.0000 | Freq: Every day | NASAL | 12 refills | Status: DC

## 2021-02-04 MED ORDER — METFORMIN HCL 500 MG PO TABS: ORAL_TABLET | ORAL | 1 refills | Status: DC

## 2021-02-04 MED ORDER — DICLOFENAC SODIUM 50 MG PO TBEC: 50.0000 mg | DELAYED_RELEASE_TABLET | Freq: Two times a day (BID) | ORAL | 1 refills | Status: DC

## 2021-02-04 MED ORDER — OLMESARTAN MEDOXOMIL 20 MG PO TABS
20.0000 mg | ORAL_TABLET | Freq: Every day | ORAL | 1 refills | Status: DC
Start: 2021-02-04 — End: 2021-04-14

## 2021-02-04 MED ORDER — AMLODIPINE BESYLATE 5 MG PO TABS
5.0000 mg | ORAL_TABLET | Freq: Every day | ORAL | 1 refills | Status: DC
Start: 2021-02-04 — End: 2021-03-19

## 2021-02-04 MED ORDER — OMEPRAZOLE 20 MG PO CPDR: 20.0000 mg | DELAYED_RELEASE_CAPSULE | Freq: Every day | ORAL | 1 refills | Status: DC

## 2021-02-04 NOTE — Patient Instructions (Signed)

## 2021-02-04 NOTE — Progress Notes (Signed)
Date:  02/04/2021   Name:  [name]   DOB:  [birth year]   MRN:  [id number]   Chief Complaint: Allergic Rhinitis , Diabetes, Gastroesophageal Reflux, and Hypertension  Diabetes She presents for her follow-up diabetic visit. She has type 2 diabetes mellitus. Her disease course has been stable. There are no hypoglycemic associated symptoms. Pertinent negatives for hypoglycemia include no dizziness, headaches, nervousness/anxiousness or sweats. There are no diabetic associated symptoms. Pertinent negatives for diabetes include no blurred vision, no chest pain, no fatigue, no foot paresthesias, no foot ulcerations, no polydipsia, no polyphagia, no polyuria, no visual change, no weakness and no weight loss. There are no hypoglycemic complications. Symptoms are stable. There are no diabetic complications. Pertinent negatives for diabetic complications include no CVA, PVD or retinopathy. There are no known risk factors for coronary artery disease. Current diabetic treatment includes oral agent (monotherapy). She is compliant with treatment all of the time. She is following a generally healthy diet. Meal planning includes avoidance of concentrated sweets and carbohydrate counting. She participates in exercise daily. Her home blood glucose trend is decreasing steadily. Her breakfast blood glucose is taken between 8-9 am. Her breakfast blood glucose range is generally 110-130 mg/dl. An ACE inhibitor/angiotensin II receptor blocker is not being taken.  Gastroesophageal Reflux She reports no abdominal pain, no chest pain, no choking, no coughing, no dysphagia, no heartburn, no nausea, no sore throat or no wheezing. This is a chronic problem. The current episode started more than 1 year ago. The problem has been gradually improving. Pertinent negatives include no anemia, fatigue, melena, muscle weakness, orthopnea or weight loss. There are no known risk factors. She has tried a PPI for the symptoms. The  treatment provided moderate relief.  Hypertension This is a chronic problem. The current episode started more than 1 year ago. The problem has been waxing and waning since onset. The problem is controlled. Pertinent negatives include no anxiety, blurred vision, chest pain, headaches, malaise/fatigue, neck pain, orthopnea, palpitations, peripheral edema, PND, shortness of breath or sweats. There are no associated agents to hypertension. Risk factors for coronary artery disease include diabetes mellitus and dyslipidemia. Past treatments include angiotensin blockers and calcium channel blockers. The current treatment provides moderate improvement. There is no history of angina, kidney disease, CAD/MI, CVA, heart failure, left ventricular hypertrophy, PVD or retinopathy. There is no history of chronic renal disease, a hypertension causing med or renovascular disease.    Lab Results  Component Value Date   CREATININE 0.69 03/18/2020   BUN 17 03/18/2020   NA 140 03/18/2020   K 5.1 03/18/2020   CL 104 03/18/2020   CO2 21 03/18/2020   Lab Results  Component Value Date   CHOL 174 03/18/2020   HDL 71 03/18/2020   LDLCALC 94 03/18/2020   TRIG 40 03/18/2020   CHOLHDL 2.2 07/23/2019   Lab Results  Component Value Date   TSH 3.394 02/06/2018   Lab Results  Component Value Date   HGBA1C CANCELED 08/17/2020   Lab Results  Component Value Date   WBC 6.2 02/06/2018   HGB 11.5 (L) 02/06/2018   HCT 35.8 02/06/2018   MCV 86.5 02/06/2018   PLT 266 02/06/2018   Lab Results  Component Value Date   ALT 12 (L) 02/05/2018   AST 14 (L) 02/05/2018   ALKPHOS 78 02/05/2018   BILITOT 0.5 02/05/2018     Review of Systems  Constitutional: Negative.  Negative for chills, fatigue, fever, malaise/fatigue, unexpected weight change  and weight loss.  HENT: Negative for congestion, ear discharge, ear pain, rhinorrhea, sinus pressure, sneezing and sore throat.   Eyes: Negative for blurred vision,  photophobia, pain, discharge, redness and itching.  Respiratory: Negative for cough, choking, shortness of breath, wheezing and stridor.   Cardiovascular: Negative for chest pain, palpitations, orthopnea and PND.  Gastrointestinal: Negative for abdominal pain, blood in stool, constipation, diarrhea, dysphagia, heartburn, melena, nausea and vomiting.  Endocrine: Negative for cold intolerance, heat intolerance, polydipsia, polyphagia and polyuria.  Genitourinary: Negative for dysuria, flank pain, frequency, hematuria, menstrual problem, pelvic pain, urgency, vaginal bleeding and vaginal discharge.  Musculoskeletal: Negative for arthralgias, back pain, myalgias, muscle weakness and neck pain.  Skin: Negative for rash.  Allergic/Immunologic: Negative for environmental allergies and food allergies.  Neurological: Negative for dizziness, weakness, light-headedness, numbness and headaches.  Hematological: Negative for adenopathy. Does not bruise/bleed easily.  Psychiatric/Behavioral: Negative for dysphoric mood. The patient is not nervous/anxious.     Patient Active Problem List   Diagnosis Date Noted  . Aortic valve regurgitation 10/23/2020  . Bradycardia 09/04/2019  . PVC's (premature ventricular contractions) 09/04/2019  . Leg edema 09/04/2019  . Sinus bradycardia 03/16/2018  . PSVT (paroxysmal supraventricular tachycardia) (HCC) 03/16/2018  . NSVT (nonsustained ventricular tachycardia) (HCC) 03/16/2018  . 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 Hyperlipidemia 09/24/2015  . Diabetes mellitus with no complication (HCC) 03/16/2015  . 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 Arthritis of knee, degenerative 03/16/2015  . Sinus infection 03/16/2015  . Diabetes (HCC)   . Essential hypertension   . GERD (gastroesophageal reflux disease)   . Status post total left knee replacement     Allergies  Allergen Reactions  . Nsaids    . Lisinopril     Cough    Past Surgical History:  Procedure Laterality Date  . BACK SURGERY    . CATARACT EXTRACTION Bilateral 03/2016  . CERVICAL DISC SURGERY  12/28/2010   "bulging disc; went in on the left side of my neck" (08/19/2013)  . JOINT REPLACEMENT    . TOTAL KNEE ARTHROPLASTY Left 03/28/2011   Dr Thurston HoleWainer  . TOTAL KNEE ARTHROPLASTY Right 08/19/2013  . TOTAL KNEE ARTHROPLASTY Right 08/19/2013   Procedure: TOTAL KNEE ARTHROPLASTY;  Surgeon: Nilda Simmerobert A Wainer, MD;  Location: [facility] OR;  Service: Orthopedics;  Laterality: Right;    Social History   Tobacco Use  . Smoking status: Never Smoker  . Smokeless tobacco: Never Used  Vaping Use  . Vaping Use: Never used  Substance Use Topics  . Alcohol use: No  . Drug use: No     Medication list has been reviewed and updated.  Current Meds  Medication Sig  . acetaminophen (TYLENOL) 325 MG tablet Take 2 tablets (650 mg total) by mouth every 6 (six) hours as needed.  Marland Kitchen. amLODipine (NORVASC) 5 MG tablet Take 1 tablet (5 mg total) by mouth daily.  . carbamide peroxide (DEBROX) 6.5 % OTIC solution Place 5 drops into both ears 2 (two) times daily.  . clobetasol (TEMOVATE) 0.05 % external solution APPLY TOPICALLY 2 TIMES A DAY FOR SPOT TREATMENT  . cyanocobalamin 100 MCG tablet Take 1 tablet by mouth daily.   Marland Kitchen. docusate sodium (COLACE) 50 MG capsule Take 1 capsule (50 mg total) by mouth 2 (two) times daily.  Marland Kitchen. glucose blood (ONE TOUCH ULTRA TEST) test strip USE 1 STRIP TO CHECK GLUCOSE ONCE DAILY  . metFORMIN (GLUCOPHAGE) 500 MG tablet TAKE 1 TABLET  BY MOUTH TWICE DAILY WITH MEALS (LAST  TIME  FILLING)  . mometasone (NASONEX) 50 MCG/ACT nasal spray Place 2 sprays into the nose daily.  . montelukast (SINGULAIR) 10 MG tablet Take 1 tablet (10 mg total) by mouth at bedtime.  Marland Kitchen olmesartan (BENICAR) 20 MG tablet Take 1 tablet (20 mg total) by mouth daily.  Marland Kitchen omeprazole (PRILOSEC) 20 MG capsule Take 1 capsule (20 mg total) by mouth daily.    PHQ  2/9 Scores 08/17/2020 03/18/2020 11/27/2019 05/17/2019  PHQ - 2 Score 0 0 0 0  PHQ- 9 Score - 0 0 0    GAD 7 : Generalized Anxiety Score 03/18/2020  Nervous, Anxious, on Edge 0  Control/stop worrying 0  Worry too much - different things 0  Trouble relaxing 0  Restless 0  Easily annoyed or irritable 0  Afraid - awful might happen 0  Total GAD 7 Score 0    BP Readings from Last 3 Encounters:  02/04/21 130/84  10/23/20 112/62  08/17/20 128/88    Physical Exam Vitals and nursing note reviewed.  Constitutional:      Appearance: She is well-developed.  HENT:     Head: Normocephalic.     Right Ear: Tympanic membrane, ear canal and external ear normal.     Left Ear: Tympanic membrane, ear canal and external ear normal.     Mouth/Throat:     Mouth: Mucous membranes are moist.  Eyes:     General: Lids are everted, no foreign bodies appreciated. No scleral icterus.       Left eye: No foreign body or hordeolum.     Conjunctiva/sclera: Conjunctivae normal.     Right eye: Right conjunctiva is not injected.     Left eye: Left conjunctiva is not injected.     Pupils: Pupils are equal, round, and reactive to light.  Neck:     Thyroid: No thyromegaly.     Vascular: No JVD.     Trachea: No tracheal deviation.  Cardiovascular:     Rate and Rhythm: Normal rate and regular rhythm.     Pulses: Normal pulses.          Dorsalis pedis pulses are 2+ on the right side and 2+ on the left side.       Posterior tibial pulses are 2+ on the right side and 2+ on the left side.     Heart sounds: Normal heart sounds, S1 normal and S2 normal. No murmur heard.  No systolic murmur is present.  No diastolic murmur is present. No friction rub. No gallop. No S3 or S4 sounds.   Pulmonary:     Effort: Pulmonary effort is normal. No respiratory distress.     Breath sounds: Normal breath sounds. No wheezing or rales.  Abdominal:     General: Bowel sounds are normal.     Palpations: Abdomen is soft. There is no  mass.     Tenderness: There is no abdominal tenderness. There is no guarding or rebound.  Musculoskeletal:        General: No tenderness. Normal range of motion.     Cervical back: Normal range of motion and neck supple.     Right lower leg: No edema.     Left lower leg: No edema.     Right foot: Normal range of motion.     Left foot: Normal range of motion.  Feet:     Right foot:     Protective Sensation: 10 sites tested. 10  sites sensed.     Skin integrity: Skin integrity normal. No ulcer, blister, skin breakdown, erythema, warmth, callus, dry skin or fissure.     Toenail Condition: Right toenails are normal.     Left foot:     Protective Sensation: 10 sites tested. 10 sites sensed.     Skin integrity: Skin integrity normal. No ulcer, blister, skin breakdown, erythema, warmth, callus, dry skin or fissure.     Toenail Condition: Left toenails are normal.  Lymphadenopathy:     Cervical: No cervical adenopathy.  Skin:    General: Skin is warm.     Findings: No rash.  Neurological:     Mental Status: She is alert and oriented to person, place, and time.     Cranial Nerves: No cranial nerve deficit.     Deep Tendon Reflexes: Reflexes normal.  Psychiatric:        Mood and Affect: Mood is not anxious or depressed.     Wt Readings from Last 3 Encounters:  02/04/21 169 lb (76.7 kg)  10/23/20 185 lb (83.9 kg)  08/17/20 176 lb (79.8 kg)    BP 130/84   Pulse 68   Ht 5\' 5"  (1.651 m)   Wt 169 lb (76.7 kg)   BMI 28.12 kg/m   Assessment and Plan: 1. Essential hypertension Chronic.  Controlled.  Stable.  Blood pressure today is 130/84.  Continue amlodipine 5 mg once a day olmesartan 20 mg once a day and will recheck renal function panel for electrolytes and GFR. - amLODipine (NORVASC) 5 MG tablet; Take 1 tablet (5 mg total) by mouth daily.  Dispense: 30 tablet; Refill: 1 - olmesartan (BENICAR) 20 MG tablet; Take 1 tablet (20 mg total) by mouth daily.  Dispense: 90 tablet; Refill:  1 - Renal Function Panel  2. Type 2 diabetes mellitus without complication, without long-term current use of insulin (HCC) Chronic.  Controlled.  Stable.  Foot exam was normal today.  We will check A1c for current status of diabetic control.  In the meantime we will continue Metformin 500 mg 1 twice a day.  Will also check microalbuminuria renal function panel for GFR as well as lipid panel. - metFORMIN (GLUCOPHAGE) 500 MG tablet; TAKE 1 TABLET BY MOUTH TWICE DAILY WITH MEALS (LAST  TIME  FILLING)  Dispense: 180 tablet; Refill: 1 - HgB A1c - Microalbumin, urine - Lipid Panel With LDL/HDL Ratio - Renal Function Panel  3. Acute seasonal allergic rhinitis due to pollen Chronic.  Controlled.  Stable.  Patient is having control with Nasonex 50 MCG 2 sprays in each nostril daily. - mometasone (NASONEX) 50 MCG/ACT nasal spray; Place 2 sprays into the nose daily.  Dispense: 17 g; Refill: 12  4. Non-seasonal allergic rhinitis, unspecified trigger Patient also takes Singulair 1 tablet at night for control of leukotriene concerns. - montelukast (SINGULAIR) 10 MG tablet; Take 1 tablet (10 mg total) by mouth at bedtime.  Dispense: 90 tablet; Refill: 1  5. Gastroesophageal reflux disease Chronic.  Controlled.  Stable.  Continue Prilosec 20 mg once a day. - omeprazole (PRILOSEC) 20 MG capsule; Take 1 capsule (20 mg total) by mouth daily.  Dispense: 90 capsule; Refill: 1  6. Chronic pain of left knee Chronic.  Uncontrolled.  Patient is status post knee replacement of some degree however continues to have pain in the left knee whereas there is no pain in the right knee status post surgery we will obtain an x-ray of the knee and will reinstitute diclofenac  50 mg 2 times a day.  And refer to sports medicine Dr. Ashley Royalty for reevaluation in 3 weeks. - diclofenac (VOLTAREN) 50 MG EC tablet; Take 1 tablet (50 mg total) by mouth 2 (two) times daily.  Dispense: 60 tablet; Refill: 1 - DG Knee Complete 4 Views Left;  Future  7. Pure hypercholesterolemia Chronic.  Controlled by diet.  Will check lipid panel for current status. - Lipid Panel With LDL/HDL Ratio

## 2021-02-05 LAB — LIPID PANEL WITH LDL/HDL RATIO
Cholesterol, Total: 193 mg/dL (ref 100–199)
HDL: 70 mg/dL (ref >39)
LDL Chol Calc (NIH): 108 mg/dL — ABNORMAL HIGH (ref 0–99)
LDL/HDL Ratio: 1.5 ratio (ref 0.0–3.2)
Triglycerides: 82 mg/dL (ref 0–149)
VLDL Cholesterol Cal: 15 mg/dL (ref 5–40)

## 2021-02-05 LAB — RENAL FUNCTION PANEL
Albumin: 4 g/dL (ref 3.6–4.6)
BUN/Creatinine Ratio: 26 (ref 12–28)
BUN: 16 mg/dL (ref 8–27)
CO2: 17 mmol/L — ABNORMAL LOW (ref 20–29)
Calcium: 9.6 mg/dL (ref 8.7–10.3)
Chloride: 102 mmol/L (ref 96–106)
Creatinine, Ser: 0.62 mg/dL (ref 0.57–1.00)
Glucose: 92 mg/dL (ref 65–99)
Phosphorus: 3.5 mg/dL (ref 3.0–4.3)
Potassium: 4.7 mmol/L (ref 3.5–5.2)
Sodium: 138 mmol/L (ref 134–144)
eGFR: 86 mL/min/{1.73_m2} (ref >59)

## 2021-02-05 LAB — MICROALBUMIN, URINE: Microalbumin, Urine: <3 ug/mL

## 2021-02-05 LAB — HEMOGLOBIN A1C
Est. average glucose Bld gHb Est-mCnc: 151 mg/dL
Hgb A1c MFr Bld: 6.9 % — ABNORMAL HIGH (ref 4.8–5.6)

## 2021-02-12 ENCOUNTER — Ambulatory Visit
Admission: RE | Admit: 2021-02-12 | Discharge: 2021-02-12 | Disposition: A | Discharge status: Home / Self Care | Payer: Medicare Other | Attending: Family Medicine | Admitting: Family Medicine

## 2021-02-12 ENCOUNTER — Other Ambulatory Visit: Payer: Self-pay

## 2021-02-12 ENCOUNTER — Ambulatory Visit
Admission: RE | Admit: 2021-02-12 | Discharge: 2021-02-12 | Disposition: A | Discharge status: Home / Self Care | Payer: Medicare Other | Source: Ambulatory Visit | Attending: Family Medicine | Admitting: Family Medicine

## 2021-02-12 DIAGNOSIS — G8929 Other chronic pain: Secondary | ICD-10-CM

## 2021-02-12 DIAGNOSIS — M25562 Pain in left knee: Secondary | ICD-10-CM | POA: Diagnosis not present

## 2021-02-12 DIAGNOSIS — Z471 Aftercare following joint replacement surgery: Secondary | ICD-10-CM | POA: Diagnosis not present

## 2021-02-12 DIAGNOSIS — Z96652 Presence of left artificial knee joint: Secondary | ICD-10-CM | POA: Diagnosis not present

## 2021-02-25 ENCOUNTER — Ambulatory Visit: Payer: Medicare Other | Admitting: Family Medicine

## 2021-03-01 ENCOUNTER — Encounter: Payer: Self-pay | Admitting: Family Medicine

## 2021-03-01 ENCOUNTER — Ambulatory Visit (INDEPENDENT_AMBULATORY_CARE_PROVIDER_SITE_OTHER): Payer: Medicare Other | Admitting: Family Medicine

## 2021-03-01 ENCOUNTER — Other Ambulatory Visit: Payer: Self-pay

## 2021-03-01 VITALS — BP 136/70 | HR 56 | Ht 65.0 in | Wt 153.0 lb

## 2021-03-01 DIAGNOSIS — Z96652 Presence of left artificial knee joint: Secondary | ICD-10-CM | POA: Diagnosis not present

## 2021-03-01 DIAGNOSIS — T8484XA Pain due to internal orthopedic prosthetic devices, implants and grafts, initial encounter: Secondary | ICD-10-CM

## 2021-03-01 NOTE — Patient Instructions (Signed)
-   Can look into "genicular nerve block" - Continue oral diclofenac twice daily as-needed - Recommend ice & heat at 20 minute intervals 2-3 times per day and gentle motion as tolerated - Our office will contact you to coordinate follow-up visit

## 2021-03-02 DIAGNOSIS — T8484XA Pain due to internal orthopedic prosthetic devices, implants and grafts, initial encounter: Secondary | ICD-10-CM | POA: Insufficient documentation

## 2021-03-02 NOTE — Progress Notes (Signed)
New Patient Office Visit  Subjective:  Patient ID: [name], female    DOB: March 09, 1932  Age: 87 y.o. MRN: [id number]  CC:  Chief Complaint  Patient presents with  . Knee Pain    HPI [name] presents for chronic left knee pain in the setting of total knee arthroplasty.  She states that she never obtained appropriate relief, denies any trauma, significant swelling, erythema.  Pain with weightbearing, motion after period of immobility, denies any buckling, no trauma.  Past Medical History:  Diagnosis Date  . GERD (gastroesophageal reflux disease)   . Hypertension   . Right knee DJD   . Type II diabetes mellitus (HCC)     Past Surgical History:  Procedure Laterality Date  . BACK SURGERY    . CATARACT EXTRACTION Bilateral 03/2016  . CERVICAL DISC SURGERY  12/28/2010   "bulging disc; went in on the left side of my neck" (08/19/2013)  . JOINT REPLACEMENT    . TOTAL KNEE ARTHROPLASTY Left 03/28/2011   Dr Thurston Hole  . TOTAL KNEE ARTHROPLASTY Right 08/19/2013  . TOTAL KNEE ARTHROPLASTY Right 08/19/2013   Procedure: TOTAL KNEE ARTHROPLASTY;  Surgeon: Nilda Simmer, MD;  Location: [facility] OR;  Service: Orthopedics;  Laterality: Right;    Family History  Problem Relation Age of Onset  . Diabetes Mother   . Cerebral aneurysm Father     Social History   Socioeconomic History  . Marital status: Widowed    Spouse name: Not on file  . Number of children: 3  . Years of education: Not on file  . Highest education level: Not on file  Occupational History  . Not on file  Tobacco Use  . Smoking status: Never Smoker  . Smokeless tobacco: Never Used  Vaping Use  . Vaping Use: Never used  Substance and Sexual Activity  . Alcohol use: No  . Drug use: No  . Sexual activity: Never  Other Topics Concern  . Not on file  Social History Narrative   Pt lives alone; does not drive   Social Determinants of Health   Financial Resource Strain: Low Risk   . Difficulty of Paying  Living Expenses: Not hard at all  Food Insecurity: No Food Insecurity  . Worried About Programme researcher, broadcasting/film/video in the Last Year: Never true  . Ran Out of Food in the Last Year: Never true  Transportation Needs: No Transportation Needs  . Lack of Transportation (Medical): No  . Lack of Transportation (Non-Medical): No  Physical Activity: Inactive  . Days of Exercise per Week: 0 days  . Minutes of Exercise per Session: 0 min  Stress: No Stress Concern Present  . Feeling of Stress : Not at all  Social Connections: Moderately Isolated  . Frequency of Communication with Friends and Family: More than three times a week  . Frequency of Social Gatherings with Friends and Family: More than three times a week  . Attends Religious Services: More than 4 times per year  . Active Member of Clubs or Organizations: No  . Attends Banker Meetings: Never  . Marital Status: Widowed  Intimate Partner Violence: Not At Risk  . Fear of Current or Ex-Partner: No  . Emotionally Abused: No  . Physically Abused: No  . Sexually Abused: No    ROS Review of Systems per HPI  Objective:   Today's Vitals: BP 136/70   Pulse (!) 56   Ht 5\' 5"  (1.651 m)   Wt  153 lb (69.4 kg)   SpO2 97%   BMI 25.46 kg/m   Physical Exam details per A&P   DG Knee Complete 4 Views Left  Result Date: 02/14/2021 CLINICAL DATA:  Persistent knee pain EXAM: LEFT KNEE - COMPLETE 4+ VIEW COMPARISON:  Radiograph May 11, 2020 FINDINGS: Three component left total knee arthroplasty without evidence of loosening or periprosthetic fracture. No acute osseous abnormality. Soft tissues are unremarkable. IMPRESSION: Three component left total knee arthroplasty without evidence of loosening or periprosthetic fracture. Electronically Signed   By: Maudry Mayhew MD   On: 02/14/2021 23:40   Independent interpretation reveals maintained surgical prosthetic hardware with no periprosthetic lucency, fractures, acute osseous processes  identified  Assessment & Plan:   Problem List Items Addressed This Visit      Musculoskeletal and Integument   Pain due to total left knee replacement [facility]) - Primary    87 year old patient with multiple comorbidities presenting with atraumatic chronic left knee pain in the setting of total knee arthroplasty.  She is not an ideal surgical candidate or candidate for prolonged pharmacotherapy, as such I have reviewed additional treatment options.  Given her exam findings of no overt hardware failure, laxity, focality of symptoms at the patella, she would benefit from a genicular nerve block.  If block achieves appropriate response then she may be a good candidate for subsequent genicular nerve ablation.  We will contact the patient to coordinate this follow-up visit.         Outpatient Encounter Medications as of 03/01/2021  Medication Sig  . acetaminophen (TYLENOL) 325 MG tablet Take 2 tablets (650 mg total) by mouth every 6 (six) hours as needed.  Marland Kitchen amLODipine (NORVASC) 5 MG tablet Take 1 tablet (5 mg total) by mouth daily.  . carbamide peroxide (DEBROX) 6.5 % OTIC solution Place 5 drops into both ears 2 (two) times daily.  . clobetasol (TEMOVATE) 0.05 % external solution APPLY TOPICALLY 2 TIMES A DAY FOR SPOT TREATMENT  . cyanocobalamin 100 MCG tablet Take 1 tablet by mouth daily.   . diclofenac (VOLTAREN) 50 MG EC tablet Take 1 tablet (50 mg total) by mouth 2 (two) times daily.  . diclofenac sodium (VOLTAREN) 1 % GEL Apply 4 g topically 4 (four) times daily as needed.  . docusate sodium (COLACE) 50 MG capsule Take 1 capsule (50 mg total) by mouth 2 (two) times daily.  Marland Kitchen glucose blood (ONE TOUCH ULTRA TEST) test strip USE 1 STRIP TO CHECK GLUCOSE ONCE DAILY  . metFORMIN (GLUCOPHAGE) 500 MG tablet TAKE 1 TABLET BY MOUTH TWICE DAILY WITH MEALS (LAST  TIME  FILLING)  . mometasone (NASONEX) 50 MCG/ACT nasal spray Place 2 sprays into the nose daily.  . montelukast (SINGULAIR) 10 MG tablet Take  1 tablet (10 mg total) by mouth at bedtime.  Marland Kitchen olmesartan (BENICAR) 20 MG tablet Take 1 tablet (20 mg total) by mouth daily.  Marland Kitchen omeprazole (PRILOSEC) 20 MG capsule Take 1 capsule (20 mg total) by mouth daily.   No facility-administered encounter medications on file as of 03/01/2021.    Follow-up: No follow-ups on file.   Jerrol Banana, MD

## 2021-03-02 NOTE — Assessment & Plan Note (Signed)
87 year old patient with multiple comorbidities presenting with atraumatic chronic left knee pain in the setting of total knee arthroplasty.  She is not an ideal surgical candidate or candidate for prolonged pharmacotherapy, as such I have reviewed additional treatment options.  Given her exam findings of no overt hardware failure, laxity, focality of symptoms at the patella, she would benefit from a genicular nerve block.  If block achieves appropriate response then she may be a good candidate for subsequent genicular nerve ablation.  We will contact the patient to coordinate this follow-up visit.

## 2021-03-04 ENCOUNTER — Telehealth: Payer: Self-pay

## 2021-03-04 NOTE — Telephone Encounter (Signed)
Please schedule patient next Friday, 03/12/21, for either 10:40 AM or 1:20 PM if patient and family are agreeable.  Appointment needs to be 40 minutes.

## 2021-03-04 NOTE — Telephone Encounter (Signed)
-----   Message from Jerrol Banana, MD sent at 03/04/2021  8:37 AM EDT ----- Regarding: Schedule patient Patient needs a visit for left knee genicular nerve block in-office procedure, 40 minutes, ideally at end of AM or PM. I believe patient's family requested Friday but confirm with them. Thanks

## 2021-03-04 NOTE — Telephone Encounter (Signed)
Patient was scheduled Friday, 03/12/21, at 4:00 PM.  For your information.

## 2021-03-12 ENCOUNTER — Ambulatory Visit (INDEPENDENT_AMBULATORY_CARE_PROVIDER_SITE_OTHER): Payer: Medicare Other | Admitting: Family Medicine

## 2021-03-12 ENCOUNTER — Encounter: Payer: Self-pay | Admitting: Family Medicine

## 2021-03-12 ENCOUNTER — Other Ambulatory Visit: Payer: Self-pay

## 2021-03-12 VITALS — BP 172/84 | HR 69 | Temp 98.3°F | Ht 65.0 in | Wt 172.0 lb

## 2021-03-12 DIAGNOSIS — Z96652 Presence of left artificial knee joint: Secondary | ICD-10-CM | POA: Diagnosis not present

## 2021-03-12 DIAGNOSIS — T8484XA Pain due to internal orthopedic prosthetic devices, implants and grafts, initial encounter: Secondary | ICD-10-CM | POA: Diagnosis not present

## 2021-03-12 NOTE — Progress Notes (Signed)
Primary Care / Sports Medicine Office Visit  Patient Information:  Patient ID: [name], female DOB: 1931/11/24 Age: 87 y.o. MRN: [id number]   [name] is a pleasant 87 y.o. female presenting with the following:  Chief Complaint  Patient presents with  . Genicular Nerve Block    Left knee; pre-procedure pain score: 5/10    Review of Systems pertinent details above   Patient Active Problem List   Diagnosis Date Noted  . Pain due to total left knee replacement (HCC) 03/02/2021  . Aortic valve regurgitation 10/23/2020  . Bradycardia 09/04/2019  . PVC's (premature ventricular contractions) 09/04/2019  . Leg edema 09/04/2019  . Sinus bradycardia 03/16/2018  . PSVT (paroxysmal supraventricular tachycardia) (HCC) 03/16/2018  . NSVT (nonsustained ventricular tachycardia) (HCC) 03/16/2018  . 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 Hyperlipidemia 09/24/2015  . Diabetes mellitus with no complication (HCC) 03/16/2015  . 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 Arthritis of knee, degenerative 03/16/2015  . Sinus infection 03/16/2015  . Diabetes (HCC)   . Essential hypertension   . GERD (gastroesophageal reflux disease)   . Status post total left knee replacement    Past Medical History:  Diagnosis Date  . GERD (gastroesophageal reflux disease)   . Hypertension   . Right knee DJD   . Type II diabetes mellitus (HCC)    Outpatient Medications Prior to Visit  Medication Sig Dispense Refill  . acetaminophen (TYLENOL) 325 MG tablet Take 2 tablets (650 mg total) by mouth every 6 (six) hours as needed.    Marland Kitchen amLODipine (NORVASC) 5 MG tablet Take 1 tablet (5 mg total) by mouth daily. 30 tablet 1  . carbamide peroxide (DEBROX) 6.5 % OTIC solution Place 5 drops into both ears 2 (two) times daily. 15 mL 0  . clobetasol (TEMOVATE) 0.05 % external solution APPLY TOPICALLY 2 TIMES A DAY FOR SPOT TREATMENT    .  cyanocobalamin 100 MCG tablet Take 1 tablet by mouth daily.     . diclofenac (VOLTAREN) 50 MG EC tablet Take 1 tablet (50 mg total) by mouth 2 (two) times daily. 60 tablet 1  . diclofenac sodium (VOLTAREN) 1 % GEL Apply 4 g topically 4 (four) times daily as needed.    . docusate sodium (COLACE) 50 MG capsule Take 1 capsule (50 mg total) by mouth 2 (two) times daily. 10 capsule 0  . glucose blood (ONE TOUCH ULTRA TEST) test strip USE 1 STRIP TO CHECK GLUCOSE ONCE DAILY 50 each 0  . metFORMIN (GLUCOPHAGE) 500 MG tablet TAKE 1 TABLET BY MOUTH TWICE DAILY WITH MEALS (LAST  TIME  FILLING) 180 tablet 1  . mometasone (NASONEX) 50 MCG/ACT nasal spray Place 2 sprays into the nose daily. 17 g 12  . montelukast (SINGULAIR) 10 MG tablet Take 1 tablet (10 mg total) by mouth at bedtime. 90 tablet 1  . olmesartan (BENICAR) 20 MG tablet Take 1 tablet (20 mg total) by mouth daily. 90 tablet 1  . omeprazole (PRILOSEC) 20 MG capsule Take 1 capsule (20 mg total) by mouth daily. 90 capsule 1   No facility-administered medications prior to visit.    Past Surgical History:  Procedure Laterality Date  . BACK SURGERY    . CATARACT EXTRACTION Bilateral 03/2016  . CERVICAL DISC SURGERY  12/28/2010   "bulging disc; went in on the left side of my neck" (08/19/2013)  . JOINT REPLACEMENT    . TOTAL  KNEE ARTHROPLASTY Left 03/28/2011   Dr Thurston Hole  . TOTAL KNEE ARTHROPLASTY Right 08/19/2013  . TOTAL KNEE ARTHROPLASTY Right 08/19/2013   Procedure: TOTAL KNEE ARTHROPLASTY;  Surgeon: Nilda Simmer, MD;  Location: [facility] OR;  Service: Orthopedics;  Laterality: Right;   Social History   Socioeconomic History  . Marital status: Widowed    Spouse name: Not on file  . Number of children: 3  . Years of education: Not on file  . Highest education level: Not on file  Occupational History  . Occupation: Retired  Tobacco Use  . Smoking status: Never Smoker  . Smokeless tobacco: Never Used  Vaping Use  . Vaping Use: Never used   Substance and Sexual Activity  . Alcohol use: Not Currently  . Drug use: Never  . Sexual activity: Not Currently  Other Topics Concern  . Not on file  Social History Narrative   Pt lives alone; does not drive   Social Determinants of Health   Financial Resource Strain: Low Risk   . Difficulty of Paying Living Expenses: Not hard at all  Food Insecurity: No Food Insecurity  . Worried About Programme researcher, broadcasting/film/video in the Last Year: Never true  . Ran Out of Food in the Last Year: Never true  Transportation Needs: No Transportation Needs  . Lack of Transportation (Medical): No  . Lack of Transportation (Non-Medical): No  Physical Activity: Inactive  . Days of Exercise per Week: 0 days  . Minutes of Exercise per Session: 0 min  Stress: No Stress Concern Present  . Feeling of Stress : Not at all  Social Connections: Moderately Isolated  . Frequency of Communication with Friends and Family: More than three times a week  . Frequency of Social Gatherings with Friends and Family: More than three times a week  . Attends Religious Services: More than 4 times per year  . Active Member of Clubs or Organizations: No  . Attends Banker Meetings: Never  . Marital Status: Widowed  Intimate Partner Violence: Not At Risk  . Fear of Current or Ex-Partner: No  . Emotionally Abused: No  . Physically Abused: No  . Sexually Abused: No   Family History  Problem Relation Age of Onset  . Diabetes Mother   . Cerebral aneurysm Father    Allergies  Allergen Reactions  . Nsaids   . Lisinopril     Cough    Vitals:   03/12/21 1558  BP: (!) 172/84  Pulse: 69  Temp: 98.3 F (36.8 C)  SpO2: 98%   Vitals:   03/12/21 1558  Weight: 172 lb (78 kg)  Height: 5\' 5"  (1.651 m)   Body mass index is 28.62 kg/m.  DG Knee Complete 4 Views Left  Result Date: 02/14/2021 CLINICAL DATA:  Persistent knee pain EXAM: LEFT KNEE - COMPLETE 4+ VIEW COMPARISON:  Radiograph May 11, 2020 FINDINGS:  Three component left total knee arthroplasty without evidence of loosening or periprosthetic fracture. No acute osseous abnormality. Soft tissues are unremarkable. IMPRESSION: Three component left total knee arthroplasty without evidence of loosening or periprosthetic fracture. Electronically Signed   By: May 13, 2020 MD   On: 02/14/2021 23:40     Independent interpretation of notes and tests performed by another provider:   None  Procedures performed:   Procedure:  Injections of left knee superolateral, superomedial, and inferomedial genicular nerves under ultrasound guidance. Samsung HS60 device utilized with permanent recording / reporting. Consent obtained and verified. Noted no overlying erythema,  induration, or other signs of local infection. Skin prepped in a sterile fashion. Ethyl chloride spray for topical analgesia.  Completed without difficulty and tolerated well. Medication: triamcinolone acetonide 40 mg/mL suspension for injection 1.5 mL total (0.5 mL per injection x 3) Advised to contact for fevers/chills, erythema, induration, drainage, or persistent bleeding.    Pertinent History, Exam, Impression, and Recommendations:   Pain due to total left knee replacement (HCC) Patient with longstanding history of left knee pain in the setting of total knee arthroplasty, we did proceed with scheduled genicular nerve blockade with lidocaine-bupivacaine.    Orders & Medications No orders of the defined types were placed in this encounter.  No orders of the defined types were placed in this encounter.    No follow-ups on file.     Jerrol Banana, MD   Primary Care Sports Medicine [facility]

## 2021-03-12 NOTE — Patient Instructions (Addendum)
You have just had a genicular nerve block to reduce pain. After the injection you may notice immediate relief of pain as a result of the numbing medications. It is important to rest the area of the injection for 24 to 48 hours after the injection. If you do have pain, simply rest the joint and use ice. If you can tolerate over the counter medications, you can try Tylenol for added relief per package instructions.  - Can transition to diclofenac dosing on an as-needed basis - Follow-up with Pain Management once scheduled

## 2021-03-12 NOTE — Assessment & Plan Note (Addendum)
Patient with longstanding history of left knee pain in the setting of total knee arthroplasty, we did proceed with scheduled genicular nerve blockade with lidocaine-bupivacaine with adjunct cortisone. Post-procedure score of 4/10 is equivocal and she has expressed desire nonetheless to follow-up with pain management for ablation evaluation. She can also discuss additional treatment options while there.

## 2021-03-16 MED ORDER — TRIAMCINOLONE ACETONIDE 40 MG/ML IJ SUSP
60.0000 mg | Freq: Once | INTRAMUSCULAR | Status: AC
  Administered 2021-03-12: 60 mg

## 2021-03-16 NOTE — Addendum Note (Signed)
Addended by: Lennart Pall on: 03/16/2021 08:46 AM   Modules accepted: Orders

## 2021-03-19 ENCOUNTER — Telehealth: Payer: Self-pay

## 2021-03-19 ENCOUNTER — Other Ambulatory Visit: Payer: Self-pay

## 2021-03-19 DIAGNOSIS — I10 Essential (primary) hypertension: Secondary | ICD-10-CM

## 2021-03-19 MED ORDER — AMLODIPINE BESYLATE 5 MG PO TABS: 5.0000 mg | ORAL_TABLET | Freq: Every day | ORAL | 0 refills | Status: DC

## 2021-03-19 NOTE — Telephone Encounter (Unsigned)
Copied from CRM [id number]. Topic: General - Inquiry >> Mar 19, 2021 11:37 AM Elliot Gault wrote: Patient would like Delice Bison to call her back regarding not being able to connect with [facility] regarding her blood pressure medication.Patient did not want to elaborate in further regarding the name of the medication and stated just have Delice Bison call me back at [phone]

## 2021-03-22 ENCOUNTER — Other Ambulatory Visit: Payer: Self-pay

## 2021-03-22 ENCOUNTER — Encounter: Payer: Self-pay | Admitting: Family Medicine

## 2021-03-22 ENCOUNTER — Ambulatory Visit: Payer: Medicare Other | Admitting: Family Medicine

## 2021-03-22 ENCOUNTER — Ambulatory Visit (INDEPENDENT_AMBULATORY_CARE_PROVIDER_SITE_OTHER): Payer: Medicare Other | Admitting: Family Medicine

## 2021-03-22 VITALS — BP 120/70 | HR 56 | Ht 65.0 in | Wt 161.0 lb

## 2021-03-22 DIAGNOSIS — E860 Dehydration: Secondary | ICD-10-CM

## 2021-03-22 DIAGNOSIS — R42 Dizziness and giddiness: Secondary | ICD-10-CM | POA: Diagnosis not present

## 2021-03-22 DIAGNOSIS — I10 Essential (primary) hypertension: Secondary | ICD-10-CM

## 2021-03-22 LAB — GLUCOSE, POCT (MANUAL RESULT ENTRY): POC Glucose: 180 mg/dl — AB (ref 70–99)

## 2021-03-22 MED ORDER — AMLODIPINE BESYLATE 5 MG PO TABS: 5.0000 mg | ORAL_TABLET | Freq: Every day | ORAL | 0 refills | Status: DC

## 2021-03-22 NOTE — Progress Notes (Signed)
Date:  03/22/2021   Name:  [name]   DOB:  [birth year]   MRN:  [id number]   Chief Complaint: Dizziness (180 4 hrs pc. BS)  Dizziness This is a new problem. The current episode started yesterday. The problem occurs intermittently. The problem has been waxing and waning. Pertinent negatives include no abdominal pain, arthralgias, chest pain, chills, congestion, coughing, fatigue, fever, headaches, myalgias, nausea, numbness, rash, sore throat, vomiting or weakness. Nothing (sit to stand) aggravates the symptoms.    Lab Results  Component Value Date   CREATININE 0.62 02/04/2021   BUN 16 02/04/2021   NA 138 02/04/2021   K 4.7 02/04/2021   CL 102 02/04/2021   CO2 17 (L) 02/04/2021   Lab Results  Component Value Date   CHOL 193 02/04/2021   HDL 70 02/04/2021   LDLCALC 108 (H) 02/04/2021   TRIG 82 02/04/2021   CHOLHDL 2.2 07/23/2019   Lab Results  Component Value Date   TSH 3.394 02/06/2018   Lab Results  Component Value Date   HGBA1C 6.9 (H) 02/04/2021   Lab Results  Component Value Date   WBC 6.2 02/06/2018   HGB 11.5 (L) 02/06/2018   HCT 35.8 02/06/2018   MCV 86.5 02/06/2018   PLT 266 02/06/2018   Lab Results  Component Value Date   ALT 12 (L) 02/05/2018   AST 14 (L) 02/05/2018   ALKPHOS 78 02/05/2018   BILITOT 0.5 02/05/2018     Review of Systems  Constitutional: Negative.  Negative for chills, fatigue, fever and unexpected weight change.  HENT: Negative for congestion, ear discharge, ear pain, rhinorrhea, sinus pressure, sneezing and sore throat.   Eyes: Negative for photophobia, pain, discharge, redness and itching.  Respiratory: Negative for cough, shortness of breath, wheezing and stridor.   Cardiovascular: Negative for chest pain and palpitations.  Gastrointestinal: Negative for abdominal pain, blood in stool, constipation, diarrhea, nausea and vomiting.  Endocrine: Negative for cold intolerance, heat intolerance, polydipsia, polyphagia and  polyuria.  Genitourinary: Negative for dysuria, flank pain, frequency, hematuria, menstrual problem, pelvic pain, urgency, vaginal bleeding and vaginal discharge.  Musculoskeletal: Negative for arthralgias, back pain and myalgias.  Skin: Negative for rash.  Allergic/Immunologic: Negative for environmental allergies and food allergies.  Neurological: Positive for dizziness. Negative for weakness, light-headedness, numbness and headaches.  Hematological: Negative for adenopathy. Does not bruise/bleed easily.  Psychiatric/Behavioral: Negative for dysphoric mood. The patient is not nervous/anxious.     Patient Active Problem List   Diagnosis Date Noted  . Pain due to total left knee replacement (HCC) 03/02/2021  . Aortic valve regurgitation 10/23/2020  . Bradycardia 09/04/2019  . PVC's (premature ventricular contractions) 09/04/2019  . Leg edema 09/04/2019  . Sinus bradycardia 03/16/2018  . PSVT (paroxysmal supraventricular tachycardia) (HCC) 03/16/2018  . NSVT (nonsustained ventricular tachycardia) (HCC) 03/16/2018  . 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 Hyperlipidemia 09/24/2015  . Diabetes mellitus with no complication (HCC) 03/16/2015  . 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 Arthritis of knee, degenerative 03/16/2015  . Sinus infection 03/16/2015  . Diabetes (HCC)   . Essential hypertension   . GERD (gastroesophageal reflux disease)   . Status post total left knee replacement     Allergies  Allergen Reactions  . Nsaids   . Lisinopril     Cough    Past Surgical History:  Procedure Laterality Date  . BACK SURGERY    . CATARACT EXTRACTION Bilateral 03/2016  .  CERVICAL DISC SURGERY  12/28/2010   "bulging disc; went in on the left side of my neck" (08/19/2013)  . JOINT REPLACEMENT    . TOTAL KNEE ARTHROPLASTY Left 03/28/2011   Dr Thurston Hole  . TOTAL KNEE ARTHROPLASTY Right 08/19/2013  . TOTAL KNEE ARTHROPLASTY  Right 08/19/2013   Procedure: TOTAL KNEE ARTHROPLASTY;  Surgeon: Nilda Simmer, MD;  Location: [facility] OR;  Service: Orthopedics;  Laterality: Right;    Social History   Tobacco Use  . Smoking status: Never Smoker  . Smokeless tobacco: Never Used  Vaping Use  . Vaping Use: Never used  Substance Use Topics  . Alcohol use: Not Currently  . Drug use: Never     Medication list has been reviewed and updated.  Current Meds  Medication Sig  . acetaminophen (TYLENOL) 325 MG tablet Take 2 tablets (650 mg total) by mouth every 6 (six) hours as needed.  Marland Kitchen amLODipine (NORVASC) 5 MG tablet Take 1 tablet (5 mg total) by mouth daily.  . carbamide peroxide (DEBROX) 6.5 % OTIC solution Place 5 drops into both ears 2 (two) times daily.  . clobetasol (TEMOVATE) 0.05 % external solution APPLY TOPICALLY 2 TIMES A DAY FOR SPOT TREATMENT  . cyanocobalamin 100 MCG tablet Take 1 tablet by mouth daily.   . diclofenac (VOLTAREN) 50 MG EC tablet Take 1 tablet (50 mg total) by mouth 2 (two) times daily.  . diclofenac sodium (VOLTAREN) 1 % GEL Apply 4 g topically 4 (four) times daily as needed.  . docusate sodium (COLACE) 50 MG capsule Take 1 capsule (50 mg total) by mouth 2 (two) times daily.  Marland Kitchen glucose blood (ONE TOUCH ULTRA TEST) test strip USE 1 STRIP TO CHECK GLUCOSE ONCE DAILY  . metFORMIN (GLUCOPHAGE) 500 MG tablet TAKE 1 TABLET BY MOUTH TWICE DAILY WITH MEALS (LAST  TIME  FILLING)  . mometasone (NASONEX) 50 MCG/ACT nasal spray Place 2 sprays into the nose daily.  . montelukast (SINGULAIR) 10 MG tablet Take 1 tablet (10 mg total) by mouth at bedtime.  Marland Kitchen olmesartan (BENICAR) 20 MG tablet Take 1 tablet (20 mg total) by mouth daily.  Marland Kitchen omeprazole (PRILOSEC) 20 MG capsule Take 1 capsule (20 mg total) by mouth daily.    PHQ 2/9 Scores 03/12/2021 08/17/2020 03/18/2020 11/27/2019  PHQ - 2 Score 0 0 0 0  PHQ- 9 Score 0 - 0 0    GAD 7 : Generalized Anxiety Score 03/12/2021 03/18/2020  Nervous, Anxious, on Edge 0 0   Control/stop worrying 0 0  Worry too much - different things 0 0  Trouble relaxing 0 0  Restless 0 0  Easily annoyed or irritable 0 0  Afraid - awful might happen 0 0  Total GAD 7 Score 0 0    BP Readings from Last 3 Encounters:  03/22/21 120/70  03/12/21 (!) 172/84  03/01/21 136/70    Physical Exam Vitals and nursing note reviewed.  Constitutional:      Appearance: She is well-developed.  HENT:     Head: Normocephalic.     Right Ear: External ear normal.     Left Ear: External ear normal.     Nose: No congestion or rhinorrhea.  Eyes:     General: Lids are everted, no foreign bodies appreciated. No scleral icterus.       Left eye: No foreign body or hordeolum.     Conjunctiva/sclera: Conjunctivae normal.     Right eye: Right conjunctiva is not injected.  Left eye: Left conjunctiva is not injected.     Pupils: Pupils are equal, round, and reactive to light.  Neck:     Thyroid: No thyromegaly.     Vascular: No JVD.     Trachea: No tracheal deviation.  Cardiovascular:     Rate and Rhythm: Normal rate and regular rhythm.     Pulses: Normal pulses.     Heart sounds: S1 normal and S2 normal. Murmur heard.   Systolic murmur is present with a grade of 1/6. No friction rub. No gallop. No S3 or S4 sounds.   Pulmonary:     Effort: Pulmonary effort is normal. No respiratory distress.     Breath sounds: Normal breath sounds. No decreased breath sounds, wheezing, rhonchi or rales.  Abdominal:     General: Bowel sounds are normal.     Palpations: Abdomen is soft. There is no mass.     Tenderness: There is no abdominal tenderness. There is no guarding or rebound.  Musculoskeletal:        General: No tenderness. Normal range of motion.     Cervical back: Normal range of motion and neck supple.  Lymphadenopathy:     Cervical: No cervical adenopathy.  Skin:    General: Skin is warm.     Findings: No rash.  Neurological:     Mental Status: She is alert and oriented to  person, place, and time.     Cranial Nerves: No cranial nerve deficit.     Deep Tendon Reflexes: Reflexes normal.  Psychiatric:        Mood and Affect: Mood is not anxious or depressed.     Wt Readings from Last 3 Encounters:  03/22/21 161 lb (73 kg)  03/12/21 172 lb (78 kg)  03/01/21 153 lb (69.4 kg)    BP 120/70   Pulse (!) 56   Ht 5\' 5"  (1.651 m)   Wt 161 lb (73 kg)   BMI 26.79 kg/m   Assessment and Plan:  1. Orthostatic dizziness New onset.  Episodic.  Stable.  Patient is noted when she goes from sitting to standing position there is some dizziness.  Blood pressure was noted to be somewhat on the lower side at 120/70.  Recently she was placed on amlodipine 5 mg which probably has cut her little on the lower side of normal so we will discontinue the amlodipine recheck on just the olmesartan in 6 weeks at which time we will determine if we need to add a lesser dose of amlodipine such as 2.5 mg.  She also has a little bit on the dehydration side have encouraged her to drink fluid of choice and copious amounts while it is getting warmer and the weather. - POCT Glucose (CBG)  2. Dehydration, mild Fluids been encouraged with the beverage of choice such as tea water or perhaps soft drinks of a diet nature.

## 2021-03-22 NOTE — Telephone Encounter (Signed)
Called back- sent amlodipine in 90 days

## 2021-04-12 ENCOUNTER — Other Ambulatory Visit: Payer: Self-pay

## 2021-04-12 ENCOUNTER — Inpatient Hospital Stay
Admission: EM | Admit: 2021-04-12 | Discharge: 2021-04-14 | DRG: 310 | Disposition: A | Discharge status: Home Health | Payer: Medicare Other | Attending: Internal Medicine | Admitting: Internal Medicine

## 2021-04-12 ENCOUNTER — Encounter: Payer: Self-pay | Admitting: Emergency Medicine

## 2021-04-12 DIAGNOSIS — I493 Ventricular premature depolarization: Secondary | ICD-10-CM | POA: Diagnosis not present

## 2021-04-12 DIAGNOSIS — Z7984 Long term (current) use of oral hypoglycemic drugs: Secondary | ICD-10-CM | POA: Diagnosis not present

## 2021-04-12 DIAGNOSIS — I340 Nonrheumatic mitral (valve) insufficiency: Secondary | ICD-10-CM | POA: Diagnosis not present

## 2021-04-12 DIAGNOSIS — R42 Dizziness and giddiness: Secondary | ICD-10-CM | POA: Diagnosis not present

## 2021-04-12 DIAGNOSIS — E785 Hyperlipidemia, unspecified: Secondary | ICD-10-CM | POA: Diagnosis present

## 2021-04-12 DIAGNOSIS — Z96653 Presence of artificial knee joint, bilateral: Secondary | ICD-10-CM | POA: Diagnosis not present

## 2021-04-12 DIAGNOSIS — R001 Bradycardia, unspecified: Secondary | ICD-10-CM | POA: Diagnosis not present

## 2021-04-12 DIAGNOSIS — Z833 Family history of diabetes mellitus: Secondary | ICD-10-CM

## 2021-04-12 DIAGNOSIS — I351 Nonrheumatic aortic (valve) insufficiency: Secondary | ICD-10-CM | POA: Diagnosis not present

## 2021-04-12 DIAGNOSIS — I472 Ventricular tachycardia: Secondary | ICD-10-CM | POA: Diagnosis not present

## 2021-04-12 DIAGNOSIS — E119 Type 2 diabetes mellitus without complications: Secondary | ICD-10-CM

## 2021-04-12 DIAGNOSIS — I10 Essential (primary) hypertension: Secondary | ICD-10-CM | POA: Diagnosis not present

## 2021-04-12 DIAGNOSIS — E1169 Type 2 diabetes mellitus with other specified complication: Secondary | ICD-10-CM

## 2021-04-12 DIAGNOSIS — R55 Syncope and collapse: Secondary | ICD-10-CM | POA: Diagnosis present

## 2021-04-12 DIAGNOSIS — Z20822 Contact with and (suspected) exposure to covid-19: Secondary | ICD-10-CM | POA: Diagnosis not present

## 2021-04-12 DIAGNOSIS — K219 Gastro-esophageal reflux disease without esophagitis: Secondary | ICD-10-CM | POA: Diagnosis not present

## 2021-04-12 DIAGNOSIS — I361 Nonrheumatic tricuspid (valve) insufficiency: Secondary | ICD-10-CM | POA: Diagnosis not present

## 2021-04-12 LAB — BASIC METABOLIC PANEL
Anion gap: 10 (ref 5–15)
BUN: 27 mg/dL — ABNORMAL HIGH (ref 8–23)
CO2: 21 mmol/L — ABNORMAL LOW (ref 22–32)
Calcium: 9.4 mg/dL (ref 8.9–10.3)
Chloride: 104 mmol/L (ref 98–111)
Creatinine, Ser: 0.72 mg/dL (ref 0.44–1.00)
GFR, Estimated: >60 mL/min (ref >60)
Glucose, Bld: 125 mg/dL — ABNORMAL HIGH (ref 70–99)
Potassium: 4.7 mmol/L (ref 3.5–5.1)
Sodium: 135 mmol/L (ref 135–145)

## 2021-04-12 LAB — URINALYSIS, COMPLETE (UACMP) WITH MICROSCOPIC
Bilirubin Urine: NEGATIVE
Glucose, UA: NEGATIVE mg/dL
Hgb urine dipstick: NEGATIVE
Ketones, ur: NEGATIVE mg/dL
Leukocytes,Ua: NEGATIVE
Nitrite: NEGATIVE
Protein, ur: NEGATIVE mg/dL
Specific Gravity, Urine: 1.01 (ref 1.005–1.030)
Squamous Epithelial / HPF: NONE SEEN (ref 0–5)
pH: 6 (ref 5.0–8.0)

## 2021-04-12 LAB — CBC
HCT: 34.9 % — ABNORMAL LOW (ref 36.0–46.0)
Hemoglobin: 11.3 g/dL — ABNORMAL LOW (ref 12.0–15.0)
MCH: 26.8 pg (ref 26.0–34.0)
MCHC: 32.4 g/dL (ref 30.0–36.0)
MCV: 82.9 fL (ref 80.0–100.0)
Platelets: 232 10*3/uL (ref 150–400)
RBC: 4.21 MIL/uL (ref 3.87–5.11)
RDW: 15.6 % — ABNORMAL HIGH (ref 11.5–15.5)
WBC: 6.2 10*3/uL (ref 4.0–10.5)
nRBC: 0 % (ref 0.0–0.2)

## 2021-04-12 MED ORDER — SODIUM CHLORIDE 0.9 % IV SOLN: INTRAVENOUS | Status: AC

## 2021-04-12 MED ORDER — DOCUSATE SODIUM 50 MG/5ML PO LIQD
50.0000 mg | Freq: Two times a day (BID) | ORAL | Status: DC
  Administered 2021-04-13 (×2): 50 mg via ORAL
  Filled 2021-04-12 (×4): qty 10

## 2021-04-12 MED ORDER — SODIUM CHLORIDE 0.9% FLUSH
3.0000 mL | Freq: Two times a day (BID) | INTRAVENOUS | Status: DC
  Administered 2021-04-14: 09:00:00 3 mL via INTRAVENOUS

## 2021-04-12 MED ORDER — ACETAMINOPHEN 325 MG PO TABS
650.0000 mg | ORAL_TABLET | Freq: Four times a day (QID) | ORAL | Status: DC | PRN
  Administered 2021-04-13 (×3): 650 mg via ORAL
  Filled 2021-04-12 (×3): qty 2

## 2021-04-12 MED ORDER — PANTOPRAZOLE SODIUM 40 MG PO TBEC
40.0000 mg | DELAYED_RELEASE_TABLET | Freq: Every day | ORAL | Status: DC
  Administered 2021-04-13 – 2021-04-14 (×2): 40 mg via ORAL
  Filled 2021-04-12 (×2): qty 1

## 2021-04-12 MED ORDER — CARBAMIDE PEROXIDE 6.5 % OT SOLN
5.0000 [drp] | Freq: Two times a day (BID) | OTIC | Status: DC
  Administered 2021-04-13 – 2021-04-14 (×3): 5 [drp] via OTIC
  Filled 2021-04-12: qty 15

## 2021-04-12 MED ORDER — CLOBETASOL PROPIONATE 0.05 % EX CREA
1.0000 "application " | TOPICAL_CREAM | Freq: Two times a day (BID) | CUTANEOUS | Status: DC
  Administered 2021-04-13 – 2021-04-14 (×3): 1 via TOPICAL
  Filled 2021-04-12: qty 15

## 2021-04-12 MED ORDER — DICLOFENAC SODIUM 1 % TD GEL
4.0000 g | Freq: Four times a day (QID) | TRANSDERMAL | Status: DC | PRN
  Filled 2021-04-12: qty 100

## 2021-04-12 MED ORDER — IRBESARTAN 150 MG PO TABS: 150.0000 mg | ORAL_TABLET | Freq: Every day | ORAL | Status: DC

## 2021-04-12 MED ORDER — ENOXAPARIN SODIUM 40 MG/0.4ML IJ SOSY
40.0000 mg | PREFILLED_SYRINGE | INTRAMUSCULAR | Status: DC
  Administered 2021-04-13 – 2021-04-14 (×2): 40 mg via SUBCUTANEOUS
  Filled 2021-04-12 (×2): qty 0.4

## 2021-04-12 NOTE — ED Provider Notes (Addendum)
[facility] Emergency Department Provider Note   ____________________________________________   Event Date/Time   First MD Initiated Contact with Patient 04/12/21 2022     (approximate)  I have reviewed the triage vital signs and the nursing notes.   HISTORY  Chief Complaint Dizziness    HPI STASHIA SIA is a 87 y.o. female with below stated past medical history presents for orthostatic dizziness that began this morning and was associated with sitting or standing.  Patient states that she has had similar episodes in the past when they have changed her blood pressure medicines around however she denies any recent medication changes.  Patient states that she took her blood pressure at home and found it to be normal but came to the emergency department via EMS due to some nausea with her dizziness this morning and one episode of vomiting.  Patient reports worsening with movement or standing and denies any relieving factors.  Patient currently denies any vision changes, tinnitus, difficulty speaking, facial droop, sore throat, chest pain, shortness of breath, abdominal pain, nausea/vomiting/diarrhea, dysuria, or weakness/numbness/paresthesias in any extremity         Past Medical History:  Diagnosis Date  . GERD (gastroesophageal reflux disease)   . Hypertension   . Right knee DJD   . Type II diabetes mellitus [facility])     Patient Active Problem List   Diagnosis Date Noted  . Pain due to total left knee replacement (HCC) 03/02/2021  . Aortic valve regurgitation 10/23/2020  . Bradycardia 09/04/2019  . PVC's (premature ventricular contractions) 09/04/2019  . Leg edema 09/04/2019  . Sinus bradycardia 03/16/2018  . PSVT (paroxysmal supraventricular tachycardia) (HCC) 03/16/2018  . NSVT (nonsustained ventricular tachycardia) (HCC) 03/16/2018  . 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 Hyperlipidemia 09/24/2015   . Diabetes mellitus with no complication (HCC) 03/16/2015  . 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 Arthritis of knee, degenerative 03/16/2015  . Sinus infection 03/16/2015  . Diabetes (HCC)   . Essential hypertension   . GERD (gastroesophageal reflux disease)   . Status post total left knee replacement     Past Surgical History:  Procedure Laterality Date  . BACK SURGERY    . CATARACT EXTRACTION Bilateral 03/2016  . CERVICAL DISC SURGERY  12/28/2010   "bulging disc; went in on the left side of my neck" (08/19/2013)  . JOINT REPLACEMENT    . TOTAL KNEE ARTHROPLASTY Left 03/28/2011   Dr Thurston Hole  . TOTAL KNEE ARTHROPLASTY Right 08/19/2013  . TOTAL KNEE ARTHROPLASTY Right 08/19/2013   Procedure: TOTAL KNEE ARTHROPLASTY;  Surgeon: Nilda Simmer, MD;  Location: [facility] OR;  Service: Orthopedics;  Laterality: Right;    Prior to Admission medications   Medication Sig Start Date End Date Taking? Authorizing Provider  acetaminophen (TYLENOL) 325 MG tablet Take 2 tablets (650 mg total) by mouth every 6 (six) hours as needed. 01/12/18   Duanne Limerick, MD  carbamide peroxide (DEBROX) 6.5 % OTIC solution Place 5 drops into both ears 2 (two) times daily. 02/25/19   Duanne Limerick, MD  clobetasol (TEMOVATE) 0.05 % external solution APPLY TOPICALLY 2 TIMES A DAY FOR SPOT TREATMENT 02/24/20   [provider]  cyanocobalamin 100 MCG tablet Take 1 tablet by mouth daily.     [provider]  diclofenac (VOLTAREN) 50 MG EC tablet Take 1 tablet (50 mg total) by mouth 2 (two) times daily. 02/04/21   Duanne Limerick,  MD  diclofenac sodium (VOLTAREN) 1 % GEL Apply 4 g topically 4 (four) times daily as needed. 01/23/18   [provider]  docusate sodium (COLACE) 50 MG capsule Take 1 capsule (50 mg total) by mouth 2 (two) times daily. 01/17/19   Duanne Limerick, MD  glucose blood (ONE TOUCH ULTRA TEST) test strip USE 1 STRIP TO CHECK GLUCOSE ONCE DAILY 05/17/19   Duanne Limerick, MD  metFORMIN (GLUCOPHAGE) 500 MG tablet TAKE 1 TABLET BY MOUTH TWICE DAILY WITH MEALS (LAST  TIME  FILLING) 02/04/21   Duanne Limerick, MD  mometasone (NASONEX) 50 MCG/ACT nasal spray Place 2 sprays into the nose daily. 02/04/21   Duanne Limerick, MD  montelukast (SINGULAIR) 10 MG tablet Take 1 tablet (10 mg total) by mouth at bedtime. 02/04/21   Duanne Limerick, MD  olmesartan (BENICAR) 20 MG tablet Take 1 tablet (20 mg total) by mouth daily. 02/04/21   Duanne Limerick, MD  omeprazole (PRILOSEC) 20 MG capsule Take 1 capsule (20 mg total) by mouth daily. 02/04/21   Duanne Limerick, MD    Allergies Nsaids and Lisinopril  Family History  Problem Relation Age of Onset  . Diabetes Mother   . Cerebral aneurysm Father     Social History Social History   Tobacco Use  . Smoking status: Never Smoker  . Smokeless tobacco: Never Used  Vaping Use  . Vaping Use: Never used  Substance Use Topics  . Alcohol use: Not Currently  . Drug use: Never    Review of Systems Constitutional: No fever/chills Eyes: No visual changes. ENT: No sore throat. Cardiovascular: Denies chest pain.  Endorses orthostatic lightheadedness Respiratory: Denies shortness of breath. Gastrointestinal: No abdominal pain.  No nausea, no vomiting.  No diarrhea. Genitourinary: Negative for dysuria. Musculoskeletal: Negative for acute arthralgias Skin: Negative for rash. Neurological: Endorses orthostatic lightheadedness.  Negative for headaches, weakness/numbness/paresthesias in any extremity Psychiatric: Negative for suicidal ideation/homicidal ideation   ____________________________________________   PHYSICAL EXAM:  VITAL SIGNS: ED Triage Vitals  Enc Vitals Group     BP 04/12/21 1602 (!) 166/75     Pulse Rate 04/12/21 1602 (!) 55     Resp 04/12/21 1602 20     Temp 04/12/21 1602 99.6 F (37.6 C)     Temp Source 04/12/21 1602 Oral     SpO2 04/12/21 1602 98 %     Weight 04/12/21 1601 160 lb (72.6 kg)      Height 04/12/21 1601 5\' 5"  (1.651 m)     Head Circumference --      Peak Flow --      Pain Score 04/12/21 1601 0     Pain Loc --      Pain Edu? --      Excl. in GC? --    Constitutional: Alert and oriented. Well appearing and in no acute distress. Eyes: Conjunctivae are normal. PERRL. Head: Atraumatic. Nose: No congestion/rhinnorhea. Mouth/Throat: Mucous membranes are moist. Neck: No stridor Cardiovascular: Bradycardic.  Grossly normal heart sounds.  Good peripheral circulation. Respiratory: Normal respiratory effort.  No retractions. Gastrointestinal: Soft and nontender. No distention. Musculoskeletal: No obvious deformities Neurologic:  Normal speech and language. No gross focal neurologic deficits are appreciated. Skin:  Skin is warm and dry. No rash noted. Psychiatric: Mood and affect are normal. Speech and behavior are normal.  ____________________________________________   LABS (all labs ordered are listed, but only abnormal results are displayed)  Labs Reviewed  BASIC METABOLIC PANEL -  Abnormal; Notable for the following components:      Result Value   CO2 21 (*)    Glucose, Bld 125 (*)    BUN 27 (*)    All other components within normal limits  CBC - Abnormal; Notable for the following components:   Hemoglobin 11.3 (*)    HCT 34.9 (*)    RDW 15.6 (*)    All other components within normal limits  URINALYSIS, COMPLETE (UACMP) WITH MICROSCOPIC - Abnormal; Notable for the following components:   Color, Urine STRAW (*)    APPearance CLEAR (*)    Bacteria, UA RARE (*)    All other components within normal limits  RESP PANEL BY RT-PCR (FLU A&B, COVID) ARPGX2  CBG MONITORING, ED   ____________________________________________  EKG  ED ECG REPORT I, Merwyn Katos, the attending physician, personally viewed and interpreted this ECG.  Date: 04/12/2021 EKG Time: 1604 Rate: 58 Rhythm: Bradycardic sinus rhythm QRS Axis: normal Intervals: normal ST/T Wave  abnormalities: normal Narrative Interpretation: no evidence of acute ischemia  PROCEDURES  Procedure(s) performed (including Critical Care):  .1-3 Lead EKG Interpretation Performed by: Merwyn Katos, MD Authorized by: Merwyn Katos, MD     Interpretation: abnormal     ECG rate:  42   ECG rate assessment: bradycardic     Rhythm: sinus bradycardia     Ectopy: none     Conduction: normal       ____________________________________________   INITIAL IMPRESSION / ASSESSMENT AND PLAN / ED COURSE  As part of my medical decision making, I reviewed the following data within the electronic MEDICAL RECORD NUMBER Nursing notes reviewed and incorporated, Labs reviewed, EKG interpreted, Old chart reviewed, Radiograph reviewed and Notes from prior ED visits reviewed and incorporated      The patient is suffering from bradycardia, but the immediate cause is not apparent.   Patient WITHOUT concerning signs of instability on exam such as altered mental status, hypotension, evidence of cardiac end organ dysfunction, or acute heart failure.  Potential emergent causes considered include, but are not limited to:  Myocardial Infarction (RCA lesion) Infection Hypothyroidism Hyperkalemia Hypoglycemia Dehydration Intoxication (beta blockade, calcium channel blockade, clonidine, digoxin, opiates, alcohol or other).  Findings: Despite the evaluation including history, exam, and testing, the cause of the bradycardia remains unclear. However the history, exam, and tests do not raise concern for the above emergent diagnoses.  Disposition: Admit to medicine      ____________________________________________   FINAL CLINICAL IMPRESSION(S) / ED DIAGNOSES  Final diagnoses:  Lightheadedness  Symptomatic bradycardia     ED Discharge Orders    None       Note:  This document was prepared using Dragon voice recognition software and may include unintentional dictation errors.   Merwyn Katos, MD 04/12/21 Resa Miner    Merwyn Katos, MD 04/12/21 [phone]

## 2021-04-12 NOTE — ED Triage Notes (Signed)
Pt c/o nausea and dizziness that started this morning. Pt reports dizziness worse with movement/standing. Pt presents to ED A&O x4, NAD noted at this time.

## 2021-04-12 NOTE — ED Notes (Signed)
Pt given food, drink, warm blanket

## 2021-04-12 NOTE — H&P (Addendum)
History and Physical   [name] KXF:[id number] DOB: 1931/12/22 DOA: 04/12/2021  PCP: Duanne Limerick, MD   Patient coming from: Home  Chief Complaint: lightheadedness on standing  HPI: [name] is a 87 y.o. female with medical history significant of aortic valve regurgitation, diabetes, hypertension, hyperlipidemia, GERD, PSVT, NSVT, bradycardia, PVCs, arthritis who presents following episodes of orthostatic lightheadedness.  Patient states that she has noticed becoming lightheaded on standing for the past day.  She states that she has had similar symptoms in the past with med changes though she did not think she had had any recent changes in her medications.  She states she took her blood pressure at home and it was normal but noted that her heart rate was read as being in the 40s.  She reports some nausea with the dizziness and 1 episode of vomiting.  She states is worse whenever she stands and moves around. States she felt like she might pass out but denies any actual LOC. No room spinning sensation.  Denies any vision changes or tinnitus.  Denies any fevers, chills, chest pain, shortness of breath, abdominal pain, constipation, diarrhea.  She was seen for similar symptoms the beginning of the month and her amlodipine was discontinued.  No symptoms since that time.  ED Course: Vital signs in the ED significant for blood pressure in the 150s 170s systolic.  Heart rate ranging from the 40s to 60s.  Otherwise stable.  Lab work-up showed BMP with bicarb 21, glucose 125.  CBC with hemoglobin stable 11.3.  Respiratory panel flu COVID pending.  Urinalysis showing only rare bacteria.  Initially there was some concern the patient may been started on a beta-blocker as there is paperwork in her room with a med list indicating metoprolol.  However, after further review this was noted to be a med list in paperwork from a different patient. She has not been started on a beta-blocker.  Before this  was realized, the med list was uploaded into the chart under the media tab this can be disregarded.  Review of Systems: As per HPI otherwise all other systems reviewed and are negative.  Past Medical History:  Diagnosis Date  . GERD (gastroesophageal reflux disease)   . Hypertension   . Right knee DJD   . Type II diabetes mellitus (HCC)     Past Surgical History:  Procedure Laterality Date  . BACK SURGERY    . CATARACT EXTRACTION Bilateral 03/2016  . CERVICAL DISC SURGERY  12/28/2010   "bulging disc; went in on the left side of my neck" (08/19/2013)  . JOINT REPLACEMENT    . TOTAL KNEE ARTHROPLASTY Left 03/28/2011   Dr Thurston Hole  . TOTAL KNEE ARTHROPLASTY Right 08/19/2013  . TOTAL KNEE ARTHROPLASTY Right 08/19/2013   Procedure: TOTAL KNEE ARTHROPLASTY;  Surgeon: Nilda Simmer, MD;  Location: [facility] OR;  Service: Orthopedics;  Laterality: Right;    Social History  reports that she has never smoked. She has never used smokeless tobacco. She reports previous alcohol use. She reports that she does not use drugs.  Allergies  Allergen Reactions  . Nsaids   . Lisinopril     Cough    Family History  Problem Relation Age of Onset  . Diabetes Mother   . Cerebral aneurysm Father   Reviewed on admission  Prior to Admission medications   Medication Sig Start Date End Date Taking? Authorizing Provider  acetaminophen (TYLENOL) 325 MG tablet Take 2 tablets (650 mg total) by  mouth every 6 (six) hours as needed. 01/12/18  Yes Duanne LimerickJones, Deanna C, MD  carbamide peroxide (DEBROX) 6.5 % OTIC solution Place 5 drops into both ears 2 (two) times daily. 02/25/19  Yes Duanne LimerickJones, Deanna C, MD  clobetasol (TEMOVATE) 0.05 % external solution APPLY TOPICALLY 2 TIMES A DAY FOR SPOT TREATMENT 02/24/20  Yes [provider]  cyanocobalamin 100 MCG tablet Take 1 tablet by mouth daily.    Yes [provider]  diclofenac (VOLTAREN) 50 MG EC tablet Take 1 tablet (50 mg total) by mouth 2 (two) times daily. 02/04/21   Yes Duanne LimerickJones, Deanna C, MD  diclofenac sodium (VOLTAREN) 1 % GEL Apply 4 g topically 4 (four) times daily as needed. 01/23/18  Yes [provider]  docusate sodium (COLACE) 50 MG capsule Take 1 capsule (50 mg total) by mouth 2 (two) times daily. 01/17/19  Yes Elizabeth SauerJones, Deanna C, MD  glucose blood (ONE TOUCH ULTRA TEST) test strip USE 1 STRIP TO CHECK GLUCOSE ONCE DAILY 05/17/19  Yes Duanne LimerickJones, Deanna C, MD  metFORMIN (GLUCOPHAGE) 500 MG tablet TAKE 1 TABLET BY MOUTH TWICE DAILY WITH MEALS (LAST  TIME  FILLING) 02/04/21  Yes Duanne LimerickJones, Deanna C, MD  mometasone (NASONEX) 50 MCG/ACT nasal spray Place 2 sprays into the nose daily. 02/04/21  Yes Duanne LimerickJones, Deanna C, MD  montelukast (SINGULAIR) 10 MG tablet Take 1 tablet (10 mg total) by mouth at bedtime. 02/04/21  Yes Duanne LimerickJones, Deanna C, MD  olmesartan (BENICAR) 20 MG tablet Take 1 tablet (20 mg total) by mouth daily. 02/04/21  Yes Duanne LimerickJones, Deanna C, MD  omeprazole (PRILOSEC) 20 MG capsule Take 1 capsule (20 mg total) by mouth daily. 02/04/21  Yes Duanne LimerickJones, Deanna C, MD    Physical Exam: Vitals:   04/12/21 1601 04/12/21 1602 04/12/21 1921 04/12/21 2300  BP:  (!) 166/75 (!) 165/73 (!) 179/65  Pulse:  (!) 55 71 (!) 53  Resp:  20 18 16   Temp:  99.6 F (37.6 C)    TempSrc:  Oral    SpO2:  98% 98% 100%  Weight: 72.6 kg     Height: 5\' 5"  (1.651 m)      Physical Exam Constitutional:      General: She is not in acute distress.    Appearance: Normal appearance.  HENT:     Head: Normocephalic and atraumatic.     Mouth/Throat:     Mouth: Mucous membranes are moist.     Pharynx: Oropharynx is clear.  Eyes:     Extraocular Movements: Extraocular movements intact.     Pupils: Pupils are equal, round, and reactive to light.  Cardiovascular:     Rate and Rhythm: Regular rhythm. Bradycardia present.     Pulses: Normal pulses.     Heart sounds: Murmur heard.    Pulmonary:     Effort: Pulmonary effort is normal. No respiratory distress.     Breath sounds: Normal breath  sounds.  Abdominal:     General: Bowel sounds are normal. There is no distension.     Palpations: Abdomen is soft.     Tenderness: There is no abdominal tenderness.  Musculoskeletal:        General: No swelling or deformity.  Skin:    General: Skin is warm and dry.  Neurological:     General: No focal deficit present.     Mental Status: Mental status is at baseline.    Labs on Admission: I have personally reviewed following labs and imaging studies  CBC:  Recent Labs  Lab 04/12/21 1602  WBC 6.2  HGB 11.3*  HCT 34.9*  MCV 82.9  PLT 232    Basic Metabolic Panel: Recent Labs  Lab 04/12/21 1602  NA 135  K 4.7  CL 104  CO2 21*  GLUCOSE 125*  BUN 27*  CREATININE 0.72  CALCIUM 9.4    GFR: Estimated Creatinine Clearance: 49.4 mL/min (by C-G formula based on SCr of 0.72 mg/dL).  Liver Function Tests: No results for input(s): AST, ALT, ALKPHOS, BILITOT, PROT, ALBUMIN in the last 168 hours.  Urine analysis:    Component Value Date/Time   COLORURINE STRAW (A) 04/12/2021 2141   APPEARANCEUR CLEAR (A) 04/12/2021 2141   LABSPEC 1.010 04/12/2021 2141   PHURINE 6.0 04/12/2021 2141   GLUCOSEU NEGATIVE 04/12/2021 2141   HGBUR NEGATIVE 04/12/2021 2141   BILIRUBINUR NEGATIVE 04/12/2021 2141   KETONESUR NEGATIVE 04/12/2021 2141   PROTEINUR NEGATIVE 04/12/2021 2141   UROBILINOGEN 0.2 08/19/2013 1134   NITRITE NEGATIVE 04/12/2021 2141   LEUKOCYTESUR NEGATIVE 04/12/2021 2141    Radiological Exams on Admission: No results found.  EKG: Independently reviewed.  Sinus bradycardia at 58 bpm.  Occasional PVCs.  Evidence of LVH and left bundle branch block.  Repolarization abnormality V1 V 2-3.  Similar to previous.  Assessment/Plan Principal Problem:   Near syncope Active Problems:   Diabetes (HCC)   Essential hypertension   GERD (gastroesophageal reflux disease)   Sinus bradycardia   Bradycardia  Near syncope Bradycardia > Patient coming in following episodes of  orthostatic dizziness on standing or sitting. > Has known history of bradycardia with instruction to avoid nodal blocking agents, typical heart rate in the 50s to 60s. > Has remained stable in the ED with heart rate intermittently in the 40s. > Current EKG with bradycardia and similar to previous. > Did have an echo in 2019 with mild to moderate aortic regurgitation. > Concern for orthostatic hypotension versus symptomatic bradycardia - Monitor on telemetry, consider cardiology consult in the morning if persistent or symptomatic - Check orthostatics - Avoid nodal blocking agents - Check magnesium for completeness - Echocardiogram - Recommend atropine for symptomatic bradycardia and notify provider if occurs - IV fluids overnight  Hypertension > Blood pressure elevated in the ED possibly can compensatory for bradycardia.  Improved to the 120 systolic by time I saw her. - We will hold home antihypertensives given this variability in blood pressure and near syncope as above..  Diabetes - SSI  Hyperlipidemia - Continue home statin  GERD - Continue PPI  DVT prophylaxis: Lovenox  Code Status:   Full  Family Communication:  Granddaughter updated bedside. Disposition Plan:   Patient is from:  Home  Anticipated DC to:  Home  Anticipated DC date:  1 to 2 days  Anticipated DC barriers: None  Consults called:  None  Admission status:  Observation, telemetry   Severity of Illness: The appropriate patient status for this patient is OBSERVATION. Observation status is judged to be reasonable and necessary in order to provide the required intensity of service to ensure the patient's safety. The patient's presenting symptoms, physical exam findings, and initial radiographic and laboratory data in the context of their medical condition is felt to place them at decreased risk for further clinical deterioration. Furthermore, it is anticipated that the patient will be medically stable for discharge  from the hospital within 2 midnights of admission. The following factors support the patient status of observation.   " The patient's presenting symptoms include orthostatic  dizziness. " The physical exam findings include bradycardia. " The initial radiographic and laboratory data are significant for bicarb 21, glucose 125, hemoglobin stable 11.3.    Synetta Fail MD [facility]  How to contact the [facility] Attending or Consulting provider 7A - 7P or covering provider during after hours 7P -7A, for this patient?   1. Check the care team in [facility] and look for a) attending/consulting [facility] provider listed and b) the [facility] team listed 2. Log into [email or web address] and use [facility] universal password to access. If you do not have the password, please contact the hospital operator. 3. Locate the [facility] provider you are looking for under [facility] and page to a number that you can be directly reached. 4. If you still have difficulty reaching the provider, please page the [facility] (Director on Call) for the Hospitalists listed on amion for assistance.  04/12/2021, 11:57 PM

## 2021-04-12 NOTE — Progress Notes (Unsigned)
Put in referral.  

## 2021-04-12 NOTE — ED Notes (Signed)
Family updated.

## 2021-04-12 NOTE — ED Triage Notes (Signed)
Pt comes via EMs from home with c/o vomiting and dizziness since this morning.  Dizziness with movement.  BP-160/86 HR-93 CBG-153 99%RA

## 2021-04-13 ENCOUNTER — Other Ambulatory Visit: Payer: Self-pay | Admitting: Physician Assistant

## 2021-04-13 ENCOUNTER — Observation Stay (HOSPITAL_COMMUNITY)
Admit: 2021-04-13 | Discharge: 2021-04-13 | Disposition: A | Discharge status: Home / Self Care | Payer: Medicare Other | Attending: Physician Assistant | Admitting: Physician Assistant

## 2021-04-13 ENCOUNTER — Telehealth: Payer: Self-pay | Admitting: *Deleted

## 2021-04-13 DIAGNOSIS — Z96653 Presence of artificial knee joint, bilateral: Secondary | ICD-10-CM | POA: Diagnosis present

## 2021-04-13 DIAGNOSIS — Z20822 Contact with and (suspected) exposure to covid-19: Secondary | ICD-10-CM | POA: Diagnosis present

## 2021-04-13 DIAGNOSIS — E119 Type 2 diabetes mellitus without complications: Secondary | ICD-10-CM | POA: Diagnosis present

## 2021-04-13 DIAGNOSIS — I493 Ventricular premature depolarization: Secondary | ICD-10-CM | POA: Diagnosis present

## 2021-04-13 DIAGNOSIS — T8484XA Pain due to internal orthopedic prosthetic devices, implants and grafts, initial encounter: Secondary | ICD-10-CM | POA: Diagnosis not present

## 2021-04-13 DIAGNOSIS — R001 Bradycardia, unspecified: Secondary | ICD-10-CM | POA: Diagnosis not present

## 2021-04-13 DIAGNOSIS — I340 Nonrheumatic mitral (valve) insufficiency: Secondary | ICD-10-CM | POA: Diagnosis not present

## 2021-04-13 DIAGNOSIS — R55 Syncope and collapse: Secondary | ICD-10-CM

## 2021-04-13 DIAGNOSIS — R42 Dizziness and giddiness: Secondary | ICD-10-CM

## 2021-04-13 DIAGNOSIS — I351 Nonrheumatic aortic (valve) insufficiency: Secondary | ICD-10-CM

## 2021-04-13 DIAGNOSIS — I4729 Other ventricular tachycardia: Secondary | ICD-10-CM

## 2021-04-13 DIAGNOSIS — Z7984 Long term (current) use of oral hypoglycemic drugs: Secondary | ICD-10-CM | POA: Diagnosis not present

## 2021-04-13 DIAGNOSIS — I5032 Chronic diastolic (congestive) heart failure: Secondary | ICD-10-CM | POA: Diagnosis not present

## 2021-04-13 DIAGNOSIS — K219 Gastro-esophageal reflux disease without esophagitis: Secondary | ICD-10-CM | POA: Diagnosis present

## 2021-04-13 DIAGNOSIS — I10 Essential (primary) hypertension: Secondary | ICD-10-CM | POA: Diagnosis present

## 2021-04-13 DIAGNOSIS — I472 Ventricular tachycardia: Secondary | ICD-10-CM

## 2021-04-13 DIAGNOSIS — E785 Hyperlipidemia, unspecified: Secondary | ICD-10-CM | POA: Diagnosis present

## 2021-04-13 DIAGNOSIS — Z833 Family history of diabetes mellitus: Secondary | ICD-10-CM | POA: Diagnosis not present

## 2021-04-13 DIAGNOSIS — I361 Nonrheumatic tricuspid (valve) insufficiency: Secondary | ICD-10-CM | POA: Diagnosis not present

## 2021-04-13 DIAGNOSIS — M4710 Other spondylosis with myelopathy, site unspecified: Secondary | ICD-10-CM | POA: Diagnosis not present

## 2021-04-13 LAB — ECHOCARDIOGRAM COMPLETE
AR max vel: 1.8 cm2
AV Area VTI: 2.19 cm2
AV Area mean vel: 1.85 cm2
AV Mean grad: 9 mmHg
AV Peak grad: 15.5 mmHg
Ao pk vel: 1.97 m/s
Area-P 1/2: 2.93 cm2
Height: 65 in
S' Lateral: 2.56 cm
Weight: 2627.88 oz

## 2021-04-13 LAB — GLUCOSE, CAPILLARY
Glucose-Capillary: 120 mg/dL — ABNORMAL HIGH (ref 70–99)
Glucose-Capillary: 94 mg/dL (ref 70–99)
Glucose-Capillary: 190 mg/dL — ABNORMAL HIGH (ref 70–99)
Glucose-Capillary: 98 mg/dL (ref 70–99)
Glucose-Capillary: 118 mg/dL — ABNORMAL HIGH (ref 70–99)

## 2021-04-13 LAB — CBC
HCT: 33.3 % — ABNORMAL LOW (ref 36.0–46.0)
Hemoglobin: 10.6 g/dL — ABNORMAL LOW (ref 12.0–15.0)
MCH: 26.8 pg (ref 26.0–34.0)
MCHC: 31.8 g/dL (ref 30.0–36.0)
MCV: 84.3 fL (ref 80.0–100.0)
Platelets: 215 10*3/uL (ref 150–400)
RBC: 3.95 MIL/uL (ref 3.87–5.11)
RDW: 15.5 % (ref 11.5–15.5)
WBC: 5.4 10*3/uL (ref 4.0–10.5)
nRBC: 0 % (ref 0.0–0.2)

## 2021-04-13 LAB — TSH: TSH: 1.824 u[IU]/mL (ref 0.350–4.500)

## 2021-04-13 LAB — BASIC METABOLIC PANEL
Anion gap: 7 (ref 5–15)
BUN: 23 mg/dL (ref 8–23)
CO2: 23 mmol/L (ref 22–32)
Calcium: 9.2 mg/dL (ref 8.9–10.3)
Chloride: 108 mmol/L (ref 98–111)
Creatinine, Ser: 0.51 mg/dL (ref 0.44–1.00)
GFR, Estimated: >60 mL/min (ref >60)
Glucose, Bld: 104 mg/dL — ABNORMAL HIGH (ref 70–99)
Potassium: 4.6 mmol/L (ref 3.5–5.1)
Sodium: 138 mmol/L (ref 135–145)

## 2021-04-13 LAB — MAGNESIUM: Magnesium: 1.8 mg/dL (ref 1.7–2.4)

## 2021-04-13 LAB — RESP PANEL BY RT-PCR (FLU A&B, COVID) ARPGX2
Influenza A by PCR: NEGATIVE
Influenza B by PCR: NEGATIVE
SARS Coronavirus 2 by RT PCR: NEGATIVE

## 2021-04-13 MED ORDER — INSULIN ASPART 100 UNIT/ML IJ SOLN
0.0000 [IU] | Freq: Three times a day (TID) | INTRAMUSCULAR | Status: DC
  Administered 2021-04-13: 12:00:00 2 [IU] via SUBCUTANEOUS

## 2021-04-13 MED ORDER — SODIUM CHLORIDE 0.9 % IV SOLN: INTRAVENOUS | Status: AC

## 2021-04-13 MED ORDER — MAGNESIUM SULFATE 2 GM/50ML IV SOLN
2.0000 g | Freq: Once | INTRAVENOUS | Status: AC
  Administered 2021-04-13: 10:00:00 2 g via INTRAVENOUS
  Filled 2021-04-13: qty 50

## 2021-04-13 NOTE — Telephone Encounter (Signed)
-----   Message from Lennon Alstrom, PA-C sent at 04/13/2021  4:12 PM EDT ----- Regarding: Zio monitor and hospital follow-up Hello,  Just spoke with internal medicine, and they are planning on discharging this patient tomorrow morning 04/14/2021.  I will try to get a Zio XT monitor placed before the patient leaves tomorrow morning.  In that circumstance, we will not need to mail a monitor.  Please still schedule follow-up with Dr. Cristal [place] or APP in 1 month (if Zio not  placed tomorrow morning, this may need pushed out a week or two to ensure monitor results have returned.)   Thank you! Signed, Lennon Alstrom, PA-C 04/13/2021, 4:13 PM Pager [phone]

## 2021-04-13 NOTE — Progress Notes (Signed)
Zio XT x2 weeks ordered and to be placed at discharge.

## 2021-04-13 NOTE — Evaluation (Signed)
Occupational Therapy Evaluation Patient Details Name: [name] MRN: [id number] DOB: [birth year] Today's Date: 04/13/2021    History of Present Illness Pt is an 87 y.o. female with a hx of hypertension, aortic regurgitation, DM2, bradycardia, PVCs, NSVT, paroxysmal SVT, GERD that presented to ED for dizziness.   Clinical Impression   Pt was seen for OT evaluation this date. Prior to hospital admission, pt was able to perform seated sink bath and dressing without assist. Pt lives by herself but has family nearby. Dtr present and reports pt will stay with her . Currently pt demonstrates impairments as described below (See OT problem list) which functionally limit *his/her ability to perform ADL/self-care tasks. Pt currently requires Min-Mod A for LB ADL and ADL transfers with RW. Pt able to get to the [facility] requiring set up and supervision for lateral lean for hygiene and MIN A for transfer from [facility]. Cues for hands/RW mgt and for anterior weight shift 2/2 initial posterior lean with each transfer attempt. Pt would benefit from skilled OT services to address noted impairments and functional limitations (see below for any additional details) in order to maximize safety and independence while minimizing falls risk and caregiver burden. Upon hospital discharge, recommend HHOT to maximize pt safety and return to functional independence during meaningful occupations of daily life.     Follow Up Recommendations  Home health OT    Equipment Recommendations  3 in 1 bedside commode    Recommendations for Other Services       Precautions / Restrictions Precautions Precautions: Fall Restrictions Weight Bearing Restrictions: No      Mobility Bed Mobility Overal bed mobility: Needs Assistance Bed Mobility: Supine to Sit     Supine to sit: Mod assist     General bed mobility comments: NT, up in recliner    Transfers Overall transfer level: Needs assistance Equipment used: Rolling walker  (2 wheeled) Transfers: Sit to/from Stand Sit to Stand: Mod assist;Min assist         General transfer comment: initially MOD A for STS from recliner, improving to MIN A from BSC, initial posterior lean noted requireing VC and assist to correct    Balance Overall balance assessment: Needs assistance Sitting-balance support: Feet supported Sitting balance-Leahy Scale: Fair   Postural control: Posterior lean Standing balance support: Bilateral upper extremity supported Standing balance-Leahy Scale: Poor Standing balance comment: reliant on UE support                           ADL either performed or assessed with clinical judgement   ADL Overall ADL's : Needs assistance/impaired                                       General ADL Comments: Pt currently requires MIN-MOD A for LB ADL Tasks, MIN-MOD A for ADL transfers with RW; family able to provide needed level of assist     Vision Patient Visual Report: No change from baseline       Perception     Praxis      Pertinent Vitals/Pain Pain Assessment:  (pt reports a "small headache" and her L bicep is "sore")     Hand Dominance Right   Extremity/Trunk Assessment Upper Extremity Assessment Upper Extremity Assessment: Generalized weakness;Overall WFL for tasks assessed;LUE deficits/detail LUE Deficits / Details: L bicep "sore" with attempts to elevate her arm  but still functionally able to use   Lower Extremity Assessment Lower Extremity Assessment: Generalized weakness   Cervical / Trunk Assessment Cervical / Trunk Assessment: Kyphotic   Communication Communication Communication: No difficulties   Cognition Arousal/Alertness: Awake/alert Behavior During Therapy: WFL for tasks assessed/performed Overall Cognitive Status: Within Functional Limits for tasks assessed                                     General Comments       Exercises Other Exercises Other Exercises:  orthostatic vitals assessed, BP with increase in standing   Shoulder Instructions      Home Living Family/patient expects to be discharged to:: Private residence Living Arrangements: Alone Available Help at Discharge: Family;Available 24 hours/day Type of Home: House Home Access: Ramped entrance     Home Layout: One level     Bathroom Shower/Tub: Chief Strategy Officer: Standard     Home Equipment: Environmental consultant - 2 wheels;Walker - 4 wheels;Cane - quad;Wheelchair - manual   Additional Comments: Per dtr, patient will be staying at daughter's home initially, 1 story home, 1+1 steps to enter      Prior Functioning/Environment Level of Independence: Needs assistance  Gait / Transfers Assistance Needed: able to ambulate household distances with quad cane, walker for community ambulation/WC ADL's / Homemaking Assistance Needed: modI for ADLs, sink bath. assist for all IADLs. pt endorsed that she does do some cooking/cleaning            OT Problem List: Decreased strength;Pain;Impaired balance (sitting and/or standing);Decreased activity tolerance;Decreased knowledge of use of DME or AE;Impaired UE functional use      OT Treatment/Interventions: Therapeutic exercise;Self-care/ADL training;Therapeutic activities;DME and/or AE instruction;Patient/family education;Balance training;Energy conservation    OT Goals(Current goals can be found in the care plan section) Acute Rehab OT Goals Patient Stated Goal: to get her strength back OT Goal Formulation: With patient/family Time For Goal Achievement: 04/27/21 Potential to Achieve Goals: Good ADL Goals Pt Will Perform Lower Body Dressing: sit to/from stand;with caregiver independent in assisting Pt Will Transfer to Toilet: with supervision;ambulating (BSC over toilet, LRAD For amb) Additional ADL Goal #1: Pt will perform seated sponge bath with set up and supervision.  OT Frequency: Min 2X/week   Barriers to D/C:             Co-evaluation              AM-PAC OT "6 Clicks" Daily Activity     Outcome Measure Help from another person eating meals?: None Help from another person taking care of personal grooming?: A Little Help from another person toileting, which includes using toliet, bedpan, or urinal?: A Little Help from another person bathing (including washing, rinsing, drying)?: A Lot Help from another person to put on and taking off regular upper body clothing?: A Little Help from another person to put on and taking off regular lower body clothing?: A Lot 6 Click Score: 17   End of Session Equipment Utilized During Treatment: Gait belt;Rolling walker Nurse Communication: Patient requests pain meds  Activity Tolerance: Patient tolerated treatment well Patient left: in chair;with call bell/phone within reach;with chair alarm set;with family/visitor present  OT Visit Diagnosis: Other abnormalities of gait and mobility (R26.89);Muscle weakness (generalized) (M62.81);Pain Pain - Right/Left: Left Pain - part of body: Arm                Time: 1610-9604  OT Time Calculation (min): 26 min Charges:  OT General Charges $OT Visit: 1 Visit OT Evaluation $OT Eval Low Complexity: 1 Low OT Treatments $Self Care/Home Management : 8-22 mins  Wynona Canes, MPH, MS, OTR/L ascom [phone] 04/13/21, 3:54 PM

## 2021-04-13 NOTE — Progress Notes (Signed)
Orthostatic Assessment Note   2:31 PM 04/13/21  Therapy Vitals  Patient Position (if appropriate) Orthostatic Vitals  Orthostatic Lying   BP- Lying   Pulse- Lying   Orthostatic Sitting  BP- Sitting  162/66   Pulse- Sitting  94  Orthostatic Standing at 0 minutes  BP- Standing at 0 minutes  166/82  Pulse- Standing at 0 minutes  90  Orthostatic Standing at 3 minutes  BP- Standing at 3 minutes  183/89  Pulse- Standing at 3 minutes  82    Olga Coaster PT, DPT 2:31 PM,04/13/21

## 2021-04-13 NOTE — Evaluation (Addendum)
Physical Therapy Evaluation Patient Details Name: [name] MRN: [id number] DOB: [birth year] Today's Date: 04/13/2021   History of Present Illness  Pt is an 87 y.o. female with a hx of hypertension, aortic regurgitation, DM2, bradycardia, PVCs, NSVT, paroxysmal SVT, GERD that presented to ED for dizziness.    Clinical Impression  Pt alert, agreeable to PT, family at bedside. Denied pain. At baseline the patient reported modI with ADLs, performs cooking/cleaning as able with family assist as needed, ambulates with quad cane in home, has walkers or WC at home for community ambulation as needed. Denied any falls in the last 6 months.  The patient demonstrated supine to sit with modA, she stated she often sleeps in her lift recliner at home. Sit <> stand several times with varying levels of assist/AD, most safe option RW and PT stabilizing walker. Pt needed minA for physical lift as well as steadying initially in standing. The patient was able to ambulate ~18ft total, CGA. Orthostatic vitals assessed, see below for details, but pt with elevated BP, RN notified. BP at end of session 156/61, HR 93. Overall the patient demonstrated deficits (see "PT Problem List") that impede the patient's functional abilities, safety, and mobility and would benefit from skilled PT intervention. Recommendation is HHPT with supervision/assistance 24hrs, family reported that they would be able to provide this support.     04/13/21 1400  Therapy Vitals  Patient Position (if appropriate) Orthostatic Vitals  Orthostatic Lying   BP- Lying   Pulse- Lying   Orthostatic Sitting  BP- Sitting  162/66   Pulse- Sitting  94  Orthostatic Standing at 0 minutes  BP- Standing at 0 minutes  166/82  Pulse- Standing at 0 minutes  90  Orthostatic Standing at 3 minutes  BP- Standing at 3 minutes  183/89  Pulse- Standing at 3 minutes 82       Follow Up Recommendations Home health PT;Supervision/Assistance - 24  hour;Supervision for mobility/OOB    Equipment Recommendations  Other (comment);3in1 (PT) (pt has RW, WC, rollator)    Recommendations for Other Services       Precautions / Restrictions Precautions Precautions: Fall Restrictions Weight Bearing Restrictions: No      Mobility  Bed Mobility Overal bed mobility: Needs Assistance Bed Mobility: Supine to Sit     Supine to sit: Mod assist          Transfers Overall transfer level: Needs assistance Equipment used: 1 person hand held assist;Quad cane;Rolling walker (2 wheeled) Transfers: Sit to/from Stand Sit to Stand: Mod assist;Min assist         General transfer comment: initially modA to stand, with reptition and RW pt able to stand with minA. posterior lean noted, physical assist for lifting needed  Ambulation/Gait   Gait Distance (Feet): 5 Feet Assistive device: Rolling walker (2 wheeled)       General Gait Details: decreased step height/length, some unsteadiness noted  Stairs            Wheelchair Mobility    Modified Rankin (Stroke Patients Only)       Balance Overall balance assessment: Needs assistance Sitting-balance support: Feet supported Sitting balance-Leahy Scale: Fair       Standing balance-Leahy Scale: Poor Standing balance comment: reliant on UE support                             Pertinent Vitals/Pain Pain Assessment: No/denies pain    Home Living Family/patient expects  to be discharged to:: Private residence Living Arrangements: Alone Available Help at Discharge: Family;Available 24 hours/day Type of Home: House Home Access: Ramped entrance     Home Layout: One level Home Equipment: Walker - 2 wheels;Walker - 4 wheels;Cane - quad;Wheelchair - manual      Prior Function Level of Independence: Needs assistance   Gait / Transfers Assistance Needed: able to ambulate household distances with quad cane, walker for community ambulation/WC  ADL's / Homemaking  Assistance Needed: modI for ADLs, sink bath. assist for all IADLs. pt endorsed that she does do some cooking/cleaning        Hand Dominance   Dominant Hand: Right    Extremity/Trunk Assessment   Upper Extremity Assessment Upper Extremity Assessment: Overall WFL for tasks assessed    Lower Extremity Assessment Lower Extremity Assessment: Generalized weakness (assist for SLR bilaterally)    Cervical / Trunk Assessment Cervical / Trunk Assessment: Kyphotic  Communication   Communication: No difficulties  Cognition Arousal/Alertness: Awake/alert Behavior During Therapy: WFL for tasks assessed/performed Overall Cognitive Status: Within Functional Limits for tasks assessed                                        General Comments      Exercises Other Exercises Other Exercises: orthostatic vitals assessed, BP with increase in standing   Assessment/Plan    PT Assessment Patient needs continued PT services  PT Problem List Decreased strength;Decreased range of motion;Decreased activity tolerance;Decreased balance;Decreased mobility;Decreased knowledge of use of DME       PT Treatment Interventions DME instruction;Balance training;Gait training;Neuromuscular re-education;Stair training;Functional mobility training;Patient/family education;Therapeutic activities;Therapeutic exercise;Wheelchair mobility training    PT Goals (Current goals can be found in the Care Plan section)  Acute Rehab PT Goals Patient Stated Goal: to get her strength back PT Goal Formulation: With patient Time For Goal Achievement: 04/27/21 Potential to Achieve Goals: Good    Frequency Min 2X/week   Barriers to discharge        Co-evaluation               AM-PAC PT "6 Clicks" Mobility  Outcome Measure Help needed turning from your back to your side while in a flat bed without using bedrails?: A Lot Help needed moving from lying on your back to sitting on the side of a flat  bed without using bedrails?: A Lot Help needed moving to and from a bed to a chair (including a wheelchair)?: A Little Help needed standing up from a chair using your arms (e.g., wheelchair or bedside chair)?: A Little Help needed to walk in hospital room?: A Little Help needed climbing 3-5 steps with a railing? : Total 6 Click Score: 14    End of Session Equipment Utilized During Treatment: Gait belt Activity Tolerance: Patient tolerated treatment well Patient left: in chair;with call bell/phone within reach;with chair alarm set Nurse Communication: Mobility status PT Visit Diagnosis: Other abnormalities of gait and mobility (R26.89);Muscle weakness (generalized) (M62.81);Other symptoms and signs involving the nervous system (R29.898);Unsteadiness on feet (R26.81)    Time: 1325-1402 PT Time Calculation (min) (ACUTE ONLY): 37 min   Charges:   PT Evaluation $PT Eval Moderate Complexity: 1 Mod PT Treatments $Therapeutic Exercise: 8-22 mins $Therapeutic Activity: 8-22 mins        Olga Coaster PT, DPT 2:23 PM,04/13/21

## 2021-04-13 NOTE — Plan of Care (Signed)
  Problem: Education: Goal: Knowledge of General Education information will improve Description Including pain rating scale, medication(s)/side effects and non-pharmacologic comfort measures Outcome: Progressing   

## 2021-04-13 NOTE — Progress Notes (Signed)
*  PRELIMINARY RESULTS* Echocardiogram 2D Echocardiogram has been performed.  Cristela Blue 04/13/2021, 1:33 PM

## 2021-04-13 NOTE — Consult Note (Signed)
Cardiology Consultation:   Patient ID: Madison LyonsDarie W Reynolds MRN: [id number]; DOB: November 07, 1932  Admit date: 04/12/2021 Date of Consult: 04/13/2021  PCP:  Duanne LimerickJones, Deanna C, MD   [facility]  Cardiologist:  Yvonne Kendallhristopher End, MD  Advanced Practice Provider:  No care team member to display Electrophysiologist:  None [phone]    Patient Profile:   [name] is a 87 y.o. female with a hx of hypertension, aortic regurgitation, DM2, bradycardia, PVCs, NSVT, paroxysmal SVT, and who is being seen today for the evaluation of bradycardia and dizziness at the request of Dr. Sunnie Nielsenegalado.  History of Present Illness:   [name] is an 87 year old female with PMH as above.  Previous 01/2018 echo showed EF 65 to 70%, G1 DD, mild to moderate MR, mild LAE, PASP 35 to 40 mmHg plus central venous/RAP.  Previous cardiac monitoring 01/2018 showed rate 43 to 152 bpm with occasional PACs and PVCs.  Brief episodes of SVT and nonsustained VT noted.  No sustained arrhythmias or prolonged pauses.  [facility] is [facility] with last office visit 10/23/2020.  No CP/SOB.  She was not checking her BP at home.  She had not fallen recently and was tolerating her medications well.  It was noted she should continue to avoid rate controlling agents due to bradycardia.  Due to moderate AR seen on her echo in 2019 with pt asx other than dependent edema (graded 1+ on exam), repeat echo was recommended before follow-up 10/2021.  She was instructed to call the office if any new symptoms develop.  BP was well controlled without any medication changes. EKG showed NSR, chronic LBBB, and PVCs with patient asymptomatic and further work-up deferred other than follow-up echo before the next visit.  She was recently seen in the outpatient setting and her amlodipine was discontinued.  On 03/22/2021, she was seen by her PCP with notes reporting dizziness when moving from sitting to standing.  BP was noted to be 120/70.   Her amlodipine 5 mg daily was discontinued.  Increased hydration was recommended, though notes indicate tea and soft drinks were likely the beverage of choice to be increased by the patient.  It is unclear if water was encouraged over the and soft drinks or if tea. No documented precautions provided regarding influence caffeine on hydration or of soft drinks on any underlying GERD.  On 04/12/2021, she presented to [facility] ED after becoming lightheaded and nauseated while standing that day.  Her symptoms only lasted for approximately 1 minute and resolved shortly after returning to the seated position.  No chest pain or shortness of breath.  She reports some discomfort of her upper epigastric area with notes indicating 1 episode of emesis that day.  No recent falls or syncopal episodes.  She reports medication compliance with amlodipine recently discontinued in the outpatient setting.  In the ED, initial SBP 150s to 170s. SB noted at 55 bpm and known history of asymptomatic bradycardia.  She was mildly febrile with temperature 99.6 F.  UA with rare bacteria without leukocytes.  Orthostatics performed and negative.  Labs significant for stable renal function: Cr 0.62, BUN 16, K 4.7, albumin 4.0.  TSH 1.84.  Hemoglobin 10.6 with hematocrit 33.3 and at baseline with previous labs showing a history of anemia.  Respiratory panel negative. EKG showed Sinus bradycardia, 58 bpm, occasional PVCs, QRS 116 with known LBBB, PR interval prolonged at 194 ms, poor R wave progression noted in the inferior and precordial leads, no  acute ST/T changes. Echo ordered and pending.   Past Medical History:  Diagnosis Date  . GERD (gastroesophageal reflux disease)   . Hypertension   . Right knee DJD   . Type II diabetes mellitus (HCC)     Past Surgical History:  Procedure Laterality Date  . BACK SURGERY    . CATARACT EXTRACTION Bilateral 03/2016  . CERVICAL DISC SURGERY  12/28/2010   "bulging disc; went in on the left side of  my neck" (08/19/2013)  . JOINT REPLACEMENT    . TOTAL KNEE ARTHROPLASTY Left 03/28/2011   Dr Thurston Hole  . TOTAL KNEE ARTHROPLASTY Right 08/19/2013  . TOTAL KNEE ARTHROPLASTY Right 08/19/2013   Procedure: TOTAL KNEE ARTHROPLASTY;  Surgeon: Nilda Simmer, MD;  Location: [facility] OR;  Service: Orthopedics;  Laterality: Right;     Home Medications:  Prior to Admission medications   Medication Sig Start Date End Date Taking? Authorizing Provider  acetaminophen (TYLENOL) 325 MG tablet Take 2 tablets (650 mg total) by mouth every 6 (six) hours as needed. 01/12/18  Yes Duanne Limerick, MD  carbamide peroxide (DEBROX) 6.5 % OTIC solution Place 5 drops into both ears 2 (two) times daily. 02/25/19  Yes Duanne Limerick, MD  clobetasol (TEMOVATE) 0.05 % external solution APPLY TOPICALLY 2 TIMES A DAY FOR SPOT TREATMENT 02/24/20  Yes [provider]  cyanocobalamin 100 MCG tablet Take 1 tablet by mouth daily.    Yes [provider]  diclofenac (VOLTAREN) 50 MG EC tablet Take 1 tablet (50 mg total) by mouth 2 (two) times daily. 02/04/21  Yes Duanne Limerick, MD  diclofenac sodium (VOLTAREN) 1 % GEL Apply 4 g topically 4 (four) times daily as needed. 01/23/18  Yes [provider]  docusate sodium (COLACE) 50 MG capsule Take 1 capsule (50 mg total) by mouth 2 (two) times daily. 01/17/19  Yes Elizabeth Sauer C, MD  glucose blood (ONE TOUCH ULTRA TEST) test strip USE 1 STRIP TO CHECK GLUCOSE ONCE DAILY 05/17/19  Yes Duanne Limerick, MD  metFORMIN (GLUCOPHAGE) 500 MG tablet TAKE 1 TABLET BY MOUTH TWICE DAILY WITH MEALS (LAST  TIME  FILLING) 02/04/21  Yes Duanne Limerick, MD  mometasone (NASONEX) 50 MCG/ACT nasal spray Place 2 sprays into the nose daily. 02/04/21  Yes Duanne Limerick, MD  montelukast (SINGULAIR) 10 MG tablet Take 1 tablet (10 mg total) by mouth at bedtime. 02/04/21  Yes Duanne Limerick, MD  olmesartan (BENICAR) 20 MG tablet Take 1 tablet (20 mg total) by mouth daily. 02/04/21  Yes Duanne Limerick,  MD  omeprazole (PRILOSEC) 20 MG capsule Take 1 capsule (20 mg total) by mouth daily. 02/04/21  Yes Duanne Limerick, MD    Inpatient Medications: Scheduled Meds: . carbamide peroxide  5 drop Both EARS BID  . clobetasol cream  1 application Topical BID  . docusate  50 mg Oral BID  . enoxaparin (LOVENOX) injection  40 mg Subcutaneous Q24H  . insulin aspart  0-9 Units Subcutaneous TID WC  . pantoprazole  40 mg Oral Daily  . sodium chloride flush  3 mL Intravenous Q12H   Continuous Infusions: . magnesium sulfate bolus IVPB 2 g (04/13/21 1025)   PRN Meds: acetaminophen, diclofenac sodium  Allergies:    Allergies  Allergen Reactions  . Nsaids   . Lisinopril     Cough    Social History:   Social History   Socioeconomic History  . Marital status: Widowed    Spouse  name: Not on file  . Number of children: 3  . Years of education: Not on file  . Highest education level: Not on file  Occupational History  . Occupation: Retired  Tobacco Use  . Smoking status: Never Smoker  . Smokeless tobacco: Never Used  Vaping Use  . Vaping Use: Never used  Substance and Sexual Activity  . Alcohol use: Not Currently  . Drug use: Never  . Sexual activity: Not Currently  Other Topics Concern  . Not on file  Social History Narrative   Pt lives alone; does not drive   Social Determinants of Health   Financial Resource Strain: Low Risk   . Difficulty of Paying Living Expenses: Not hard at all  Food Insecurity: No Food Insecurity  . Worried About Programme researcher, broadcasting/film/video in the Last Year: Never true  . Ran Out of Food in the Last Year: Never true  Transportation Needs: No Transportation Needs  . Lack of Transportation (Medical): No  . Lack of Transportation (Non-Medical): No  Physical Activity: Inactive  . Days of Exercise per Week: 0 days  . Minutes of Exercise per Session: 0 min  Stress: No Stress Concern Present  . Feeling of Stress : Not at all  Social Connections: Moderately Isolated   . Frequency of Communication with Friends and Family: More than three times a week  . Frequency of Social Gatherings with Friends and Family: More than three times a week  . Attends Religious Services: More than 4 times per year  . Active Member of Clubs or Organizations: No  . Attends Banker Meetings: Never  . Marital Status: Widowed  Intimate Partner Violence: Not At Risk  . Fear of Current or Ex-Partner: No  . Emotionally Abused: No  . Physically Abused: No  . Sexually Abused: No    Family History:    Family History  Problem Relation Age of Onset  . Diabetes Mother   . Cerebral aneurysm Father      ROS:  Please see the history of present illness.  Review of Systems  Respiratory: Negative for shortness of breath.   Cardiovascular: Negative for chest pain, palpitations and leg swelling.  Gastrointestinal: Positive for abdominal pain, nausea and vomiting.  Musculoskeletal: Negative for falls.  Neurological: Positive for dizziness. Negative for loss of consciousness.  All other systems reviewed and are negative.   All other ROS reviewed and negative.     Physical Exam/Data:   Vitals:   04/13/21 0218 04/13/21 0427 04/13/21 0428 04/13/21 0824  BP: (!) 154/60 137/65  135/85  Pulse: (!) 52 (!) 51  (!) 51  Resp: Temp: 98.2 F (36.8 C) 98.2 F (36.8 C)  (!) 97.5 F (36.4 C)  TempSrc: Oral     SpO2: 100% 100%  100%  Weight:   74.5 kg   Height:        Intake/Output Summary (Last 24 hours) at 04/13/2021 1040 Last data filed at 04/13/2021 1026 Gross per 24 hour  Intake 240 ml  Output --  Net 240 ml   Last 3 Weights 04/13/2021 04/12/2021 03/22/2021  Weight (lbs) 164 lb 3.9 oz 160 lb 161 lb  Weight (kg) 74.5 kg 72.576 kg 73.029 kg     Body mass index is 27.33 kg/m.  General:  Elderly female, NAD HEENT: normal Neck: no JVD, JVP ~9cm Endocrine:  No thryomegaly Vascular: Radial pulses 2+ bilaterally  Cardiac:  normal S1, S2; bradycardic but  regular  with occasional extrasystole, 2/6 systolic murmur Lungs: Clear to auscultation bilaterally Abd: soft, nontender, no hepatomegaly  Ext: no edema Musculoskeletal:  No deformities Skin: warm and dry  Neuro: no focal abnormalities noted Psych:  Normal affect   EKG:  The EKG was personally reviewed and demonstrates:  EKG showed Sinus bradycardia, 58 bpm, occasional PVCs, QRS 116 with known LBBB, PR interval prolonged at 194 ms, poor R wave progression noted in the inferior and precordial leads, no acute ST/T changes.  Telemetry:  Telemetry was personally reviewed and demonstrates: Sinus bradycardia and sinus rhythm at 40-60bpm with rates predominantly in the 50s, PVCs, as well as a 14-second atrial run at 100 bpm around 8:55 AM  Relevant CV Studies: Pending updated echo  Echo 01/2018 - Procedure narrative: Transthoracic echocardiography. Image  quality was suboptimal.  - Left ventricle: The cavity size was normal. Wall thickness could  not be accurately determined. Systolic function was vigorous. The  estimated ejection fraction was in the range of 65% to 70%.  Doppler parameters are consistent with abnormal left ventricular  relaxation (grade 1 diastolic dysfunction). Doppler parameters  are consistent with high ventricular filling pressure.  - Aortic valve: There was probably mild to moderate regurgitation,  though suboptimal acoustic windows limit evaluation.  - Left atrium: The atrium was mildly dilated.  - Right ventricle: The cavity size was normal. Systolic function  was normal.  - Pulmonary arteries: Systolic pressure was mildly to moderately  increased, in the range of 35 mm Hg to 40 mm Hg plus central  venous/right atrial pressure.   Ambulatory cardiac monitoring 01/2018  The patient was monitored for 14 days.  The predominant rhythm was sinus with intermittent intraventricular conduction delay. The average rate was 66 bpm (range 43-152  bpm).  Occasional PVC's and rare PAC's were observed.  Five episodes of non-sustained ventricular tachycardia versus SVT with aberrancy were seen, lasting up to 9 beats.  Nine episodes of supraventricular tachycardia were seen, lasting up to 7 beats.  There were no sustained arrhythmias or prolonged pauses.  The patient did not report any symptoms.   Predominantly sinus rhythm with occasional PVC's and rare PAC's.  Brief runs of SVT and NSVT noted.   Laboratory Data:  High Sensitivity Troponin:  No results for input(s): TROPONINIHS in the last 720 hours.   Chemistry Recent Labs  Lab 04/12/21 1602 04/13/21 0612  NA 135 138  K 4.7 4.6  CL 104 108  CO2 21* 23  GLUCOSE 125* 104*  BUN 27* 23  CREATININE 0.72 0.51  CALCIUM 9.4 9.2  GFRNONAA >60 >60  ANIONGAP 10 7    No results for input(s): PROT, ALBUMIN, AST, ALT, ALKPHOS, BILITOT in the last 168 hours. Hematology Recent Labs  Lab 04/12/21 1602 04/13/21 0612  WBC 6.2 5.4  RBC 4.21 3.95  HGB 11.3* 10.6*  HCT 34.9* 33.3*  MCV 82.9 84.3  MCH 26.8 26.8  MCHC 32.4 31.8  RDW 15.6* 15.5  PLT 232 215   BNPNo results for input(s): BNP, PROBNP in the last 168 hours.  DDimer No results for input(s): DDIMER in the last 168 hours.   Radiology/Studies:  No results found.   Assessment and Plan:   Dizziness and nausea Asymptomatic sinus bradycardia -- Reports dizziness and nausea that quickly alleviates with sitting.  She was seen by her PCP recently with amlodipine discontinued due to SBP 120s.  Dehydration was suspected with encouragement she increase her tea and soda consumption per documentaton.  It is  unclear if water was recommended over that of caffeinated beverages or those that may exacerbate her GERD (soda). She was slightly febrile at presentation with previously known asx SB and PVCs noted. Orthostatics negative at presentation, which argues against dehydration. Given her consumption of soda, cannot rule out  nausea and emesis 2/2 worsening GERD with consideration of pt report of epigastric discomfort and 1 episode of emesis, all of which could then result in dizziness and result in dehydration.   Echo ordered and pending results to reassess her aortic valve, as well as rule out any other acute structural abnormalities and reassess EF/WM. No reported anginal symptoms and pt euvolemic on exam. If echo without any acute abnormalities, no plan for further ischemic work-up and will plan for 2 weeks Zio XT at discharge as below.  Continue current medications. BP monitoring at home.   Hydration with water encouraged. Avoid soda and caffeine.   Valvular heart disease with AR --As above with 2019 echo showing AR mild to moderate. Given her most recent sx and as her primary cardiologist had wanted an updated echo to reassess valvular function at this time, echo ordered with further recommendations if needed pending those results. Hydration and BP control recommended.   Hypertension --Currently well controlled. Continue current medications. Monitor at home.  PVC, PSVT, NSVT Sinus bradycardia --PVCs noted on telemetry and EKGs. Known history of SB with rates predominantly in the 50s on monitoring and short run of AT noted.  Previously noted to be asx with PVCs and SB and with previous 2019  monitoring without significant sustained arrhythmia or pauses. pSVT and NSVT noted. If echo without acute findings, recommend cardiac monitoring with 2 weeks Zio XT at discharge to rule out any underlying arrhythmia or significant pauses as contributing to sx.      For questions or updates, please contact [facility] Please consult [email or web address] for contact info under    Signed, Lennon Alstrom, PA-C  04/13/2021 10:40 AM

## 2021-04-13 NOTE — Progress Notes (Addendum)
PROGRESS NOTE    [name]  XVQ:[id number] DOB: May 03, 1932 DOA: 04/12/2021 PCP: Duanne Limerick, MD   Brief Narrative: 87 year old with past medical history significant for aortic valve regurgitation, diabetes, hypertension, hyperlipidemia, GERD, PSVT, NSVT, bradycardia, PVCs, arthritis who presents following episode of orthostatic lightheadedness.  Porter lightheadedness on standing for the last day prior to admission.  No recent medication changes.  Patient reported 1 episode of nausea with the dizziness and 1 episode of vomiting.  She denies chest pain shortness of breath.  Evaluation in the ED: Blood pressure elevated, heart rate in the 40s to 60s.    Assessment & Plan:   Principal Problem:   Near syncope Active Problems:   Diabetes (HCC)   Essential hypertension   GERD (gastroesophageal reflux disease)   Sinus bradycardia   Bradycardia   1-Bradycardia: Near syncope -Patient presented with dizziness on standing heart rate in the 40s to 60 -Avoid AV nodal blocking agents -Follow echocardiogram: -Cardiology has been consulted -PT eval -Continue to monitor overnight on telemetry -Orthostatic vital negative.  -EKG sinus bradycardia.  -TSH normal range.  -Mg 1.8, replete IV.   2-Hypertension: Now, systolic blood pressure 110 range  3-Diabetes: Continue with a sliding scale insulin 4-Hyperlipidemia: Continue with statins 5-GERD: Continue with PPI       Estimated body mass index is 27.33 kg/m as calculated from the following:   Height as of this encounter: 5\' 5"  (1.651 m).   Weight as of this encounter: 74.5 kg.   DVT prophylaxis: Lovenox Code Status: Full Code Family Communication: Daughter updated.  Disposition Plan:  Status is: Observation  The patient remains OBS appropriate and will d/c before 2 midnights.  Dispo: The patient is from: Home              Anticipated d/c is to: Home              Patient currently is not medically stable to d/c.    Difficult to place patient No        Consultants:   Cardiology   Procedures:   ECHO pending.   Antimicrobials:    Subjective: She is feeling better. She reporter dizziness on standing yesterday.  She denies chest pain, dyspnea, diarrhea.    Objective: Vitals:   04/13/21 0427 04/13/21 0428 04/13/21 0824 04/13/21 1134  BP: 137/65  135/85 118/63  Pulse: (!) 51  (!) 51 61  Resp: 16  19 20   Temp: 98.2 F (36.8 C)  (!) 97.5 F (36.4 C) 97.6 F (36.4 C)  TempSrc:   Oral Oral  SpO2: 100%  100% 97%  Weight:  74.5 kg    Height:        Intake/Output Summary (Last 24 hours) at 04/13/2021 1404 Last data filed at 04/13/2021 1134 Gross per 24 hour  Intake 240 ml  Output 700 ml  Net -460 ml   Filed Weights   04/12/21 1601 04/13/21 0428  Weight: 72.6 kg 74.5 kg    Examination:  General exam: Appears calm and comfortable  Respiratory system: Clear to auscultation. Respiratory effort normal. Cardiovascular system: S1 & S2 heard, RRR. No JVD, murmurs, rubs, gallops or clicks. No pedal edema. Gastrointestinal system: Abdomen is nondistended, soft and nontender. No organomegaly or masses felt. Normal bowel sounds heard. Central nervous system: Alert and oriented. No focal neurological deficits. Extremities: Symmetric 5 x 5 power.   Data Reviewed: I have personally reviewed following labs and imaging studies  CBC: Recent Labs  Lab 04/12/21  1602 04/13/21 0612  WBC 6.2 5.4  HGB 11.3* 10.6*  HCT 34.9* 33.3*  MCV 82.9 84.3  PLT 232 215   Basic Metabolic Panel: Recent Labs  Lab 04/12/21 1602 04/13/21 0612  NA 135 138  K 4.7 4.6  CL 104 108  CO2 21* 23  GLUCOSE 125* 104*  BUN 27* 23  CREATININE 0.72 0.51  CALCIUM 9.4 9.2  MG 1.8  --    GFR: Estimated Creatinine Clearance: 50.1 mL/min (by C-G formula based on SCr of 0.51 mg/dL). Liver Function Tests: No results for input(s): AST, ALT, ALKPHOS, BILITOT, PROT, ALBUMIN in the last 168 hours. No results for  input(s): LIPASE, AMYLASE in the last 168 hours. No results for input(s): AMMONIA in the last 168 hours. Coagulation Profile: No results for input(s): INR, PROTIME in the last 168 hours. Cardiac Enzymes: No results for input(s): CKTOTAL, CKMB, CKMBINDEX, TROPONINI in the last 168 hours. BNP (last 3 results) No results for input(s): PROBNP in the last 8760 hours. HbA1C: No results for input(s): HGBA1C in the last 72 hours. CBG: Recent Labs  Lab 04/13/21 0432 04/13/21 0825 04/13/21 1135  GLUCAP 120* 94 190*   Lipid Profile: No results for input(s): CHOL, HDL, LDLCALC, TRIG, CHOLHDL, LDLDIRECT in the last 72 hours. Thyroid Function Tests: Recent Labs    04/13/21 0612  TSH 1.824   Anemia Panel: No results for input(s): VITAMINB12, FOLATE, FERRITIN, TIBC, IRON, RETICCTPCT in the last 72 hours. Sepsis Labs: No results for input(s): PROCALCITON, LATICACIDVEN in the last 168 hours.  Recent Results (from the past 240 hour(s))  Resp Panel by RT-PCR (Flu A&B, Covid) Nasopharyngeal Swab     Status: None   Collection Time: 04/12/21 10:06 PM   Specimen: Nasopharyngeal Swab; Nasopharyngeal(NP) swabs in vial transport medium  Result Value Ref Range Status   SARS Coronavirus 2 by RT PCR NEGATIVE NEGATIVE Final    Comment: (NOTE) SARS-CoV-2 target nucleic acids are NOT DETECTED.  The SARS-CoV-2 RNA is generally detectable in upper respiratory specimens during the acute phase of infection. The lowest concentration of SARS-CoV-2 viral copies this assay can detect is 138 copies/mL. A negative result does not preclude SARS-Cov-2 infection and should not be used as the sole basis for treatment or other patient management decisions. A negative result may occur with  improper specimen collection/handling, submission of specimen other than nasopharyngeal swab, presence of viral mutation(s) within the areas targeted by this assay, and inadequate number of viral copies(<138 copies/mL). A  negative result must be combined with clinical observations, patient history, and epidemiological information. The expected result is Negative.  Fact Sheet for Patients:  BloggerCourse.com  Fact Sheet for Healthcare Providers:  SeriousBroker.it  This test is no t yet approved or cleared by the [place] FDA and  has been authorized for detection and/or diagnosis of SARS-CoV-2 by FDA under an Emergency Use Authorization (EUA). This EUA will remain  in effect (meaning this test can be used) for the duration of the COVID-19 declaration under Section 564(b)(1) of the Act, 21 U.S.C.section 360bbb-3(b)(1), unless the authorization is terminated  or revoked sooner.       Influenza A by PCR NEGATIVE NEGATIVE Final   Influenza B by PCR NEGATIVE NEGATIVE Final    Comment: (NOTE) The Xpert Xpress SARS-CoV-2/FLU/RSV plus assay is intended as an aid in the diagnosis of influenza from Nasopharyngeal swab specimens and should not be used as a sole basis for treatment. Nasal washings and aspirates are unacceptable for Xpert Xpress SARS-CoV-2/FLU/RSV  testing.  Fact Sheet for Patients: BloggerCourse.com  Fact Sheet for Healthcare Providers: SeriousBroker.it  This test is not yet approved or cleared by the [place] FDA and has been authorized for detection and/or diagnosis of SARS-CoV-2 by FDA under an Emergency Use Authorization (EUA). This EUA will remain in effect (meaning this test can be used) for the duration of the COVID-19 declaration under Section 564(b)(1) of the Act, 21 U.S.C. section 360bbb-3(b)(1), unless the authorization is terminated or revoked.  Performed at [facility], [address] [place], [place] [address]          Radiology Studies: No results found.      Scheduled Meds: . carbamide peroxide  5 drop Both EARS BID  . clobetasol cream   1 application Topical BID  . docusate  50 mg Oral BID  . enoxaparin (LOVENOX) injection  40 mg Subcutaneous Q24H  . insulin aspart  0-9 Units Subcutaneous TID WC  . pantoprazole  40 mg Oral Daily  . sodium chloride flush  3 mL Intravenous Q12H   Continuous Infusions:   LOS: 0 days    Time spent: 35 minutes.     Alba Cory, MD [facility]   If 7PM-7AM, please contact night-coverage [email or web address]  04/13/2021, 2:04 PM

## 2021-04-13 NOTE — TOC Progression Note (Signed)
Transition of Care [facility]) - Progression Note    Patient Details  Name: [name] MRN: [id number] Date of Birth: [birth year]  Transition of Care [facility]) CM/SW Contact  Caryn Section, RN Phone Number: 04/13/2021, 11:23 AM  Clinical Narrative:   Patient lives at home alone, but relatives are nearby and help as needed.  States she feels safe going home after discharge, and relatives can stay the night if/as needed.  She currently has no home health or other services at home.  Patient has no concerns about getting to appointments or getting medications, as family assists with this.    Patient walks with a cane, but states she feels she needs a more steady cane.  She also requests to have a raised toilet seat at home.  Care team made aware for PT eval.  Declines any other TOC needs at this time.  TOC contact information given, TOC to follow through dischargwe.    Expected Discharge Plan:  (TBD) Barriers to Discharge: Continued Medical Work up  Expected Discharge Plan and Services Expected Discharge Plan:  (TBD)   Discharge Planning Services: CM Consult                                           Social Determinants of Health (SDOH) Interventions    Readmission Risk Interventions No flowsheet data found.

## 2021-04-14 ENCOUNTER — Inpatient Hospital Stay (HOSPITAL_COMMUNITY)
Admit: 2021-04-14 | Discharge: 2021-04-14 | Disposition: A | Discharge status: Home / Self Care | Payer: Medicare Other | Attending: Physician Assistant | Admitting: Physician Assistant

## 2021-04-14 DIAGNOSIS — I4729 Other ventricular tachycardia: Secondary | ICD-10-CM

## 2021-04-14 DIAGNOSIS — R55 Syncope and collapse: Secondary | ICD-10-CM | POA: Diagnosis not present

## 2021-04-14 DIAGNOSIS — I472 Ventricular tachycardia: Secondary | ICD-10-CM

## 2021-04-14 LAB — GLUCOSE, CAPILLARY
Glucose-Capillary: 121 mg/dL — ABNORMAL HIGH (ref 70–99)
Glucose-Capillary: 112 mg/dL — ABNORMAL HIGH (ref 70–99)
Glucose-Capillary: 114 mg/dL — ABNORMAL HIGH (ref 70–99)
Glucose-Capillary: 91 mg/dL (ref 70–99)

## 2021-04-14 NOTE — TOC Transition Note (Signed)
Transition of Care [facility]) - CM/SW Discharge Note   Patient Details  Name: [name] MRN: [id number] Date of Birth: 12-08-1931  Transition of Care [facility]) CM/SW Contact:  Caryn Section, RN Phone Number: 04/14/2021, 1:09 PM   Clinical Narrative:   Patient is discharged today.  Daughter aware.  Walker ordered, [employer] will deliver.  [facility] will take patient for Home Health PT.  Daughter has no further question.      Barriers to Discharge: Continued Medical Work up   Patient Goals and CMS Choice        Discharge Placement                       Discharge Plan and Services   Discharge Planning Services: CM Consult                                 Social Determinants of Health (SDOH) Interventions     Readmission Risk Interventions No flowsheet data found.

## 2021-04-14 NOTE — TOC Progression Note (Signed)
Transition of Care [facility]) - Progression Note    Patient Details  Name: [name] MRN: [id number] Date of Birth: July 05, 1932  Transition of Care [facility]) CM/SW Contact  Caryn Section, RN Phone Number: 04/14/2021, 10:54 AM  Clinical Narrative:   TOC in to see patient about discharge planning.  Home Health PT recommended.  Patient stated she will be going home with her daughter, and to call daughter [name].  TOC called daughter [name], who states she lives at [address] [place], [place] and [facility] would have to see patient there for a few weeks until patient goes home.  TOC will look for Home health in [place] and follow up with patient and daughter.  3n1 ordered for patient from [employer], to be delivered to room.  TOC contact information given to patient and daughter.  TOC will follow through discharge.   Expected Discharge Plan:  (TBD) Barriers to Discharge: Continued Medical Work up  Expected Discharge Plan and Services Expected Discharge Plan:  (TBD)   Discharge Planning Services: CM Consult                                           Social Determinants of Health (SDOH) Interventions    Readmission Risk Interventions No flowsheet data found.

## 2021-04-14 NOTE — Discharge Summary (Signed)
[name]:[id number] DOB: 04-03-1932 DOA: 04/12/2021  PCP: Duanne Limerick, MD  Admit date: 04/12/2021 Discharge date: 04/14/2021  Admitted From: home Disposition:  home  Recommendations for Outpatient Follow-up:  1. Follow up with PCP in 1 week 2. Please obtain BMP/CBC in one week 3. Cardiology Dr. Okey Dupre in 4 weeks  Home Health: Yes   Discharge Condition:Stable CODE STATUS: Full Diet recommendation: Heart Healthy Brief/Interim Summary: Per WJX:BJYNW Lacretia Nicks Reynolds is a 87 y.o. female with medical history significant of aortic valve regurgitation, diabetes, hypertension, hyperlipidemia, GERD, PSVT, NSVT, bradycardia, PVCs, arthritis who presents following episodes of orthostatic lightheadedness. Patient stated that she has noticed becoming lightheaded on standing for the past day.  She states that she has had similar symptoms in the past with med changes though she did not think she had had any recent changes in her medications.  She states she took her blood pressure at home and it was normal but noted that her heart rate was read as being in the 40s.She reported some nausea with the dizziness and 1 episode of vomiting.  She stated is worse whenever she stands and moves around. States she felt like she might pass out but denies any actual LOC.She was admitted to the hospital. Cardiology was consulted.   1-Bradycardia: Near syncope/lightheadedness -Avoid AV nodal blocking agents Cardiology consulted- It is possible that her frequent PVCs and underlying bradycardia could be contributing.  Echocardiogram is reassuring with normal LVEF and only mild regurgitant valvular heart disease.  Cardiology  recommended obtaining a 14-day event monitor (ZIO XT) and placed at discharge.  No further inpatient cardiology work-up is recommended.   Follow-up with cardiology in the office in about 4 weeks to reassess her symptoms and review results of the event monitor.  PT/OT -rec.  HH  2-Hypertension: Blood pressure stable off bp meds. Discussed this with daughter, about f/u with pcp for further management.  3-Diabetes: Continue home meds 4-Hyperlipidemia: Continue with statins 5-GERD: Continue with PPI    Discharge Diagnoses:  Principal Problem:   Near syncope Active Problems:   Diabetes (HCC)   Essential hypertension   GERD (gastroesophageal reflux disease)   Sinus bradycardia   Bradycardia    Discharge Instructions  Discharge Instructions    Diet - low sodium heart healthy   Complete by: As directed    Increase activity slowly   Complete by: As directed      Allergies as of 04/14/2021      Reactions   Nsaids    Lisinopril    Cough      Medication List    STOP taking these medications   acetaminophen 325 MG tablet Commonly known as: TYLENOL   diclofenac 50 MG EC tablet Commonly known as: VOLTAREN   olmesartan 20 MG tablet Commonly known as: BENICAR     TAKE these medications   carbamide peroxide 6.5 % OTIC solution Commonly known as: DEBROX Place 5 drops into both ears 2 (two) times daily.   clobetasol 0.05 % external solution Commonly known as: TEMOVATE APPLY TOPICALLY 2 TIMES A DAY FOR SPOT TREATMENT   cyanocobalamin 100 MCG tablet Take 1 tablet by mouth daily.   diclofenac sodium 1 % Gel Commonly known as: VOLTAREN Apply 4 g topically 4 (four) times daily as needed.   docusate sodium 50 MG capsule Commonly known as: COLACE Take 1 capsule (50 mg total) by mouth 2 (two) times daily.   glucose blood test strip Commonly known as: ONE TOUCH ULTRA TEST  USE 1 STRIP TO CHECK GLUCOSE ONCE DAILY   metFORMIN 500 MG tablet Commonly known as: GLUCOPHAGE TAKE 1 TABLET BY MOUTH TWICE DAILY WITH MEALS (LAST  TIME  FILLING)   mometasone 50 MCG/ACT nasal spray Commonly known as: Nasonex Place 2 sprays into the nose daily.   montelukast 10 MG tablet Commonly known as: SINGULAIR Take 1 tablet (10 mg total) by mouth at  bedtime.   omeprazole 20 MG capsule Commonly known as: PRILOSEC Take 1 capsule (20 mg total) by mouth daily.            Durable Medical Equipment  (From admission, onward)         Start     Ordered   04/13/21 1148  For home use only DME 3 n 1  Once        04/13/21 1148          Follow-up Information    End, Cristal Deer, MD Follow up in 4 week(s).   Specialty: Cardiology Contact information: [address] [place] [address] [phone]        Duanne Limerick, MD Follow up in 1 week(s).   Specialty: Family Medicine Contact information: [address] [place] [address] [phone]              Allergies  Allergen Reactions  . Nsaids   . Lisinopril     Cough    Consultations: cardiology  Procedures/Studies: ECHOCARDIOGRAM COMPLETE  Result Date: 04/13/2021    ECHOCARDIOGRAM REPORT   Patient Name:   [name] Date of Exam: 04/13/2021 Medical Rec #:  [id number]       Height:       65.0 in Accession #:    [id number]      Weight:       164.2 lb Date of Birth:  1932-01-31       BSA:          1.819 m Patient Age:    87 years        BP:           118/63 mmHg Patient Gender: F               HR:           61 bpm. Exam Location:  [facility] Procedure: 2D Echo, Cardiac Doppler and Color Doppler Indications:     Syncope R55  History:         Patient has prior history of Echocardiogram examinations, most                  recent 02/06/2018. Risk Factors:Hypertension and Diabetes.  Sonographer:     Cristela Blue RDCS (AE) Referring Phys:  [id number] Lennon Alstrom Diagnosing Phys: Yvonne Kendall MD  Sonographer Comments: Suboptimal apical window. IMPRESSIONS  1. Left ventricular ejection fraction, by estimation, is 60 to 65%. The left ventricle has normal function. Left ventricular endocardial border not optimally defined to evaluate regional wall motion. There is mild left ventricular hypertrophy. Left ventricular diastolic parameters are  consistent with Grade I diastolic dysfunction (impaired relaxation).  2. Right ventricular systolic function was not well visualized. The right ventricular size is normal.  3. The mitral valve is normal in structure. Mild mitral valve regurgitation. No evidence of mitral stenosis.  4. The aortic valve was not well visualized. Aortic valve regurgitation is mild. No aortic stenosis is present. FINDINGS  Left Ventricle: Left ventricular ejection fraction, by estimation, is 60  to 65%. The left ventricle has normal function. Left ventricular endocardial border not optimally defined to evaluate regional wall motion. The left ventricular internal cavity size was normal in size. There is mild left ventricular hypertrophy. Abnormal (paradoxical) septal motion, consistent with left bundle branch block. Left ventricular diastolic parameters are consistent with Grade I diastolic dysfunction (impaired relaxation). Right Ventricle: The right ventricular size is normal. Right vetricular wall thickness was not well visualized. Right ventricular systolic function was not well visualized. Left Atrium: Left atrial size was normal in size. Right Atrium: Right atrial size was normal in size. Pericardium: The pericardium was not well visualized. Mitral Valve: The mitral valve is normal in structure. Mild mitral valve regurgitation. No evidence of mitral valve stenosis. Tricuspid Valve: The tricuspid valve is not well visualized. Tricuspid valve regurgitation is mild. Aortic Valve: The aortic valve was not well visualized. Aortic valve regurgitation is mild. No aortic stenosis is present. Aortic valve mean gradient measures 9.0 mmHg. Aortic valve peak gradient measures 15.5 mmHg. Aortic valve area, by VTI measures 2.19 cm. Pulmonic Valve: The pulmonic valve was not well visualized. Aorta: The aortic root is normal in size and structure. Venous: The inferior vena cava was not well visualized. IAS/Shunts: The interatrial septum was not  well visualized.  LEFT VENTRICLE PLAX 2D LVIDd:         3.64 cm  Diastology LVIDs:         2.56 cm  LV e' medial:    6.74 cm/s LV PW:         1.01 cm  LV E/e' medial:  10.0 LV IVS:        0.92 cm  LV e' lateral:   6.20 cm/s LVOT diam:     2.00 cm  LV E/e' lateral: 10.9 LV SV:         74 LV SV Index:   41 LVOT Area:     3.14 cm  RIGHT VENTRICLE RV Basal diam:  2.98 cm LEFT ATRIUM             Index       RIGHT ATRIUM           Index LA diam:        3.10 cm 1.70 cm/m  RA Area:     12.40 cm LA Vol (A2C):   55.5 ml 30.51 ml/m RA Volume:   25.70 ml  14.13 ml/m LA Vol (A4C):   26.7 ml 14.68 ml/m LA Biplane Vol: 41.5 ml 22.81 ml/m  AORTIC VALVE AV Area (Vmax):    1.80 cm AV Area (Vmean):   1.85 cm AV Area (VTI):     2.19 cm AV Vmax:           197.00 cm/s AV Vmean:          136.000 cm/s AV VTI:            0.339 m AV Peak Grad:      15.5 mmHg AV Mean Grad:      9.0 mmHg LVOT Vmax:         113.00 cm/s LVOT Vmean:        80.200 cm/s LVOT VTI:          0.236 m LVOT/AV VTI ratio: 0.70  AORTA Ao Root diam: 2.30 cm MITRAL VALVE               TRICUSPID VALVE MV Area (PHT): 2.93 cm    TR Peak grad:   26.6 mmHg  MV Decel Time: 259 msec    TR Vmax:        258.00 cm/s MV E velocity: 67.70 cm/s MV A velocity: 87.00 cm/s  SHUNTS MV E/A ratio:  0.78        Systemic VTI:  0.24 m                            Systemic Diam: 2.00 cm Yvonne Kendall MD Electronically signed by Yvonne Kendall MD Signature Date/Time: 04/13/2021/3:11:39 PM    Final       Subjective: Has no dizziness, sob, cp  Discharge Exam: Vitals:   04/14/21 0829 04/14/21 1124  BP: 136/60 (!) 121/53  Pulse: 65 (!) 59  Resp: 16 16  Temp: 97.8 F (36.6 C) 98.1 F (36.7 C)  SpO2: 96% 98%   Vitals:   04/14/21 0449 04/14/21 0500 04/14/21 0829 04/14/21 1124  BP: 122/66  136/60 (!) 121/53  Pulse: (!) 52  65 (!) 59  Resp: 16  16 16   Temp: 97.7 F (36.5 C)  97.8 F (36.6 C) 98.1 F (36.7 C)  TempSrc: Oral   Oral  SpO2: 99%  96% 98%  Weight:  71.4 kg     Height:        General: Pt is alert, awake, not in acute distress Cardiovascular: RRR, S1/S2 +, no rubs, no gallops Respiratory: CTA bilaterally, no wheezing, no rhonchi Abdominal: Soft, NT, ND, bowel sounds + Extremities: no edema   The results of significant diagnostics from this hospitalization (including imaging, microbiology, ancillary and laboratory) are listed below for reference.     Microbiology: Recent Results (from the past 240 hour(s))  Resp Panel by RT-PCR (Flu A&B, Covid) Nasopharyngeal Swab     Status: None   Collection Time: 04/12/21 10:06 PM   Specimen: Nasopharyngeal Swab; Nasopharyngeal(NP) swabs in vial transport medium  Result Value Ref Range Status   SARS Coronavirus 2 by RT PCR NEGATIVE NEGATIVE Final    Comment: (NOTE) SARS-CoV-2 target nucleic acids are NOT DETECTED.  The SARS-CoV-2 RNA is generally detectable in upper respiratory specimens during the acute phase of infection. The lowest concentration of SARS-CoV-2 viral copies this assay can detect is 138 copies/mL. A negative result does not preclude SARS-Cov-2 infection and should not be used as the sole basis for treatment or other patient management decisions. A negative result may occur with  improper specimen collection/handling, submission of specimen other than nasopharyngeal swab, presence of viral mutation(s) within the areas targeted by this assay, and inadequate number of viral copies(<138 copies/mL). A negative result must be combined with clinical observations, patient history, and epidemiological information. The expected result is Negative.  Fact Sheet for Patients:  04/14/21  Fact Sheet for Healthcare Providers:  BloggerCourse.com  This test is no t yet approved or cleared by the [place] FDA and  has been authorized for detection and/or diagnosis of SARS-CoV-2 by FDA under an Emergency Use Authorization (EUA). This  EUA will remain  in effect (meaning this test can be used) for the duration of the COVID-19 declaration under Section 564(b)(1) of the Act, 21 U.S.C.section 360bbb-3(b)(1), unless the authorization is terminated  or revoked sooner.       Influenza A by PCR NEGATIVE NEGATIVE Final   Influenza B by PCR NEGATIVE NEGATIVE Final    Comment: (NOTE) The Xpert Xpress SARS-CoV-2/FLU/RSV plus assay is intended as an aid in the diagnosis of influenza from Nasopharyngeal swab specimens and should  not be used as a sole basis for treatment. Nasal washings and aspirates are unacceptable for Xpert Xpress SARS-CoV-2/FLU/RSV testing.  Fact Sheet for Patients: BloggerCourse.comhttps://www.fda.gov/media/152166/download  Fact Sheet for Healthcare Providers: SeriousBroker.ithttps://www.fda.gov/media/152162/download  This test is not yet approved or cleared by the [place] FDA and has been authorized for detection and/or diagnosis of SARS-CoV-2 by FDA under an Emergency Use Authorization (EUA). This EUA will remain in effect (meaning this test can be used) for the duration of the COVID-19 declaration under Section 564(b)(1) of the Act, 21 U.S.C. section 360bbb-3(b)(1), unless the authorization is terminated or revoked.  Performed at [facility], [address] [place], [place] [id number]      Labs: BNP (last 3 results) No results for input(s): BNP in the last 8760 hours. Basic Metabolic Panel: Recent Labs  Lab 04/12/21 1602 04/13/21 0612  NA 135 138  K 4.7 4.6  CL 104 108  CO2 21* 23  GLUCOSE 125* 104*  BUN 27* 23  CREATININE 0.72 0.51  CALCIUM 9.4 9.2  MG 1.8  --    Liver Function Tests: No results for input(s): AST, ALT, ALKPHOS, BILITOT, PROT, ALBUMIN in the last 168 hours. No results for input(s): LIPASE, AMYLASE in the last 168 hours. No results for input(s): AMMONIA in the last 168 hours. CBC: Recent Labs  Lab 04/12/21 1602 04/13/21 0612  WBC 6.2 5.4  HGB 11.3* 10.6*  HCT 34.9* 33.3*   MCV 82.9 84.3  PLT 232 215   Cardiac Enzymes: No results for input(s): CKTOTAL, CKMB, CKMBINDEX, TROPONINI in the last 168 hours. BNP: Invalid input(s): POCBNP CBG: Recent Labs  Lab 04/13/21 1620 04/13/21 2153 04/14/21 0514 04/14/21 0756 04/14/21 1111  GLUCAP 98 118* 121* 112* 114*   D-Dimer No results for input(s): DDIMER in the last 72 hours. Hgb A1c No results for input(s): HGBA1C in the last 72 hours. Lipid Profile No results for input(s): CHOL, HDL, LDLCALC, TRIG, CHOLHDL, LDLDIRECT in the last 72 hours. Thyroid function studies Recent Labs    04/13/21 0612  TSH 1.824   Anemia work up No results for input(s): VITAMINB12, FOLATE, FERRITIN, TIBC, IRON, RETICCTPCT in the last 72 hours. Urinalysis    Component Value Date/Time   COLORURINE STRAW (A) 04/12/2021 2141   APPEARANCEUR CLEAR (A) 04/12/2021 2141   LABSPEC 1.010 04/12/2021 2141   PHURINE 6.0 04/12/2021 2141   GLUCOSEU NEGATIVE 04/12/2021 2141   HGBUR NEGATIVE 04/12/2021 2141   BILIRUBINUR NEGATIVE 04/12/2021 2141   KETONESUR NEGATIVE 04/12/2021 2141   PROTEINUR NEGATIVE 04/12/2021 2141   UROBILINOGEN 0.2 08/19/2013 1134   NITRITE NEGATIVE 04/12/2021 2141   LEUKOCYTESUR NEGATIVE 04/12/2021 2141   Sepsis Labs Invalid input(s): PROCALCITONIN,  WBC,  LACTICIDVEN Microbiology Recent Results (from the past 240 hour(s))  Resp Panel by RT-PCR (Flu A&B, Covid) Nasopharyngeal Swab     Status: None   Collection Time: 04/12/21 10:06 PM   Specimen: Nasopharyngeal Swab; Nasopharyngeal(NP) swabs in vial transport medium  Result Value Ref Range Status   SARS Coronavirus 2 by RT PCR NEGATIVE NEGATIVE Final    Comment: (NOTE) SARS-CoV-2 target nucleic acids are NOT DETECTED.  The SARS-CoV-2 RNA is generally detectable in upper respiratory specimens during the acute phase of infection. The lowest concentration of SARS-CoV-2 viral copies this assay can detect is 138 copies/mL. A negative result does not preclude  SARS-Cov-2 infection and should not be used as the sole basis for treatment or other patient management decisions. A negative result may occur with  improper specimen  collection/handling, submission of specimen other than nasopharyngeal swab, presence of viral mutation(s) within the areas targeted by this assay, and inadequate number of viral copies(<138 copies/mL). A negative result must be combined with clinical observations, patient history, and epidemiological information. The expected result is Negative.  Fact Sheet for Patients:  BloggerCourse.com  Fact Sheet for Healthcare Providers:  SeriousBroker.it  This test is no t yet approved or cleared by the [place] FDA and  has been authorized for detection and/or diagnosis of SARS-CoV-2 by FDA under an Emergency Use Authorization (EUA). This EUA will remain  in effect (meaning this test can be used) for the duration of the COVID-19 declaration under Section 564(b)(1) of the Act, 21 U.S.C.section 360bbb-3(b)(1), unless the authorization is terminated  or revoked sooner.       Influenza A by PCR NEGATIVE NEGATIVE Final   Influenza B by PCR NEGATIVE NEGATIVE Final    Comment: (NOTE) The Xpert Xpress SARS-CoV-2/FLU/RSV plus assay is intended as an aid in the diagnosis of influenza from Nasopharyngeal swab specimens and should not be used as a sole basis for treatment. Nasal washings and aspirates are unacceptable for Xpert Xpress SARS-CoV-2/FLU/RSV testing.  Fact Sheet for Patients: BloggerCourse.com  Fact Sheet for Healthcare Providers: SeriousBroker.it  This test is not yet approved or cleared by the [place] FDA and has been authorized for detection and/or diagnosis of SARS-CoV-2 by FDA under an Emergency Use Authorization (EUA). This EUA will remain in effect (meaning this test can be used) for the duration of  the COVID-19 declaration under Section 564(b)(1) of the Act, 21 U.S.C. section 360bbb-3(b)(1), unless the authorization is terminated or revoked.  Performed at [facility], [address] [place], [place] [address]      Time coordinating discharge: Over 30 minutes  SIGNED:   Lynn Ito, MD  [facility] 04/14/2021, 12:51 PM Pager   If 7PM-7AM, please contact night-coverage [email or web address] Password [email or web address]

## 2021-04-15 ENCOUNTER — Telehealth: Payer: Self-pay

## 2021-04-15 DIAGNOSIS — I472 Ventricular tachycardia: Secondary | ICD-10-CM | POA: Diagnosis not present

## 2021-04-15 NOTE — Telephone Encounter (Signed)
Zio In Progress, placed during hospital stay   Monitor: ZIO AT Serial Number [id number] Monitor Start Date 04/14/2021 Monitor Status Activated  F/u appt with Eula Listen 6/21

## 2021-04-15 NOTE — Telephone Encounter (Signed)
Transition Care Management Unsuccessful Follow-up Telephone Call  Date of discharge and from where:  04/14/21 [facility]  Attempts:  1st Attempt  Reason for unsuccessful TCM follow-up call:  Left voice message. Pt may reach me at [phone]

## 2021-04-16 ENCOUNTER — Telehealth: Payer: Self-pay

## 2021-04-16 NOTE — Telephone Encounter (Signed)
Patients daughter [name] called with questions for [name]. She said her mother was in the hospital this week and when she was discharged- was told to stop her BP medications until she comes to see Dr [name] for a follow up from the hospital. Patient is scheduled on 04/23/2021 next week.  Informed daughter that [name] and Dr [name] will be back in the office next Tuesday, however told her most likely [name] and [name] would tell her to follow the hospitalist' instructions until she comes in for a follow up to discuss. I told her I would route this message to [name] to read once she returns to the clinic.

## 2021-04-16 NOTE — Telephone Encounter (Signed)
Transition Care Management Follow-up Telephone Call  Date of discharge and from where: 04/14/21 [facility]  How have you been since you were released from the hospital? Pt doing okay, denies pain, lightheadedness or dizziness  Any questions or concerns? No  Items Reviewed:  Did the pt receive and understand the discharge instructions provided? Yes   Medications obtained and verified? Yes   Other? No   Any new allergies since your discharge? No   Dietary orders reviewed? Yes  Do you have support at home? Yes   Home Care and Equipment/Supplies: Were home health services ordered? No per daughter [name], hosp note states [name] for PT Were any new equipment or medical supplies ordered?  Yes: 3N1, rolling walker What is the name of the medical supply agency? [employer] Were you able to get the supplies/equipment? yes Do you have any questions related to the use of the equipment or supplies? No  Functional Questionnaire: (I = Independent and D = Dependent) ADLs: I  Bathing/Dressing- I  Meal Prep- D  Eating- I  Maintaining continence- I  Transferring/Ambulation- I with assistive device  Managing Meds- D  Follow up appointments reviewed:   PCP Hospital f/u appt confirmed? Yes  Scheduled to see Dr. Yetta Reynolds on 04/23/21 @ 3:40.  Specialist Hospital f/u appt confirmed? Yes  Scheduled to see [name] on 05/11/21 @ 10:00.  Are transportation arrangements needed? No   If their condition worsens, is the pt aware to call PCP or go to the Emergency Dept.? Yes  Was the patient provided with contact information for the PCP's office or ED? Yes  Was to pt encouraged to call back with questions or concerns? Yes

## 2021-04-17 DIAGNOSIS — Z7984 Long term (current) use of oral hypoglycemic drugs: Secondary | ICD-10-CM | POA: Diagnosis not present

## 2021-04-17 DIAGNOSIS — Z96652 Presence of left artificial knee joint: Secondary | ICD-10-CM | POA: Diagnosis not present

## 2021-04-17 DIAGNOSIS — I472 Ventricular tachycardia: Secondary | ICD-10-CM | POA: Diagnosis not present

## 2021-04-17 DIAGNOSIS — M179 Osteoarthritis of knee, unspecified: Secondary | ICD-10-CM | POA: Diagnosis not present

## 2021-04-17 DIAGNOSIS — E119 Type 2 diabetes mellitus without complications: Secondary | ICD-10-CM | POA: Diagnosis not present

## 2021-04-17 DIAGNOSIS — I351 Nonrheumatic aortic (valve) insufficiency: Secondary | ICD-10-CM | POA: Diagnosis not present

## 2021-04-17 DIAGNOSIS — E7849 Other hyperlipidemia: Secondary | ICD-10-CM | POA: Diagnosis not present

## 2021-04-17 DIAGNOSIS — I119 Hypertensive heart disease without heart failure: Secondary | ICD-10-CM | POA: Diagnosis not present

## 2021-04-17 DIAGNOSIS — I493 Ventricular premature depolarization: Secondary | ICD-10-CM | POA: Diagnosis not present

## 2021-04-20 DIAGNOSIS — I119 Hypertensive heart disease without heart failure: Secondary | ICD-10-CM | POA: Diagnosis not present

## 2021-04-20 DIAGNOSIS — M179 Osteoarthritis of knee, unspecified: Secondary | ICD-10-CM | POA: Diagnosis not present

## 2021-04-20 DIAGNOSIS — I351 Nonrheumatic aortic (valve) insufficiency: Secondary | ICD-10-CM | POA: Diagnosis not present

## 2021-04-20 DIAGNOSIS — I472 Ventricular tachycardia: Secondary | ICD-10-CM | POA: Diagnosis not present

## 2021-04-20 DIAGNOSIS — Z7984 Long term (current) use of oral hypoglycemic drugs: Secondary | ICD-10-CM | POA: Diagnosis not present

## 2021-04-20 DIAGNOSIS — E7849 Other hyperlipidemia: Secondary | ICD-10-CM | POA: Diagnosis not present

## 2021-04-20 DIAGNOSIS — I493 Ventricular premature depolarization: Secondary | ICD-10-CM | POA: Diagnosis not present

## 2021-04-20 DIAGNOSIS — E119 Type 2 diabetes mellitus without complications: Secondary | ICD-10-CM | POA: Diagnosis not present

## 2021-04-20 DIAGNOSIS — Z96652 Presence of left artificial knee joint: Secondary | ICD-10-CM | POA: Diagnosis not present

## 2021-04-20 NOTE — Telephone Encounter (Signed)
Called and spoke to [place]- told to stay off med until her mom sees Yetta Barre

## 2021-04-21 DIAGNOSIS — I119 Hypertensive heart disease without heart failure: Secondary | ICD-10-CM | POA: Diagnosis not present

## 2021-04-21 DIAGNOSIS — I351 Nonrheumatic aortic (valve) insufficiency: Secondary | ICD-10-CM | POA: Diagnosis not present

## 2021-04-21 DIAGNOSIS — I493 Ventricular premature depolarization: Secondary | ICD-10-CM | POA: Diagnosis not present

## 2021-04-21 DIAGNOSIS — E7849 Other hyperlipidemia: Secondary | ICD-10-CM | POA: Diagnosis not present

## 2021-04-21 DIAGNOSIS — Z96652 Presence of left artificial knee joint: Secondary | ICD-10-CM | POA: Diagnosis not present

## 2021-04-21 DIAGNOSIS — M179 Osteoarthritis of knee, unspecified: Secondary | ICD-10-CM | POA: Diagnosis not present

## 2021-04-21 DIAGNOSIS — I472 Ventricular tachycardia: Secondary | ICD-10-CM | POA: Diagnosis not present

## 2021-04-21 DIAGNOSIS — Z7984 Long term (current) use of oral hypoglycemic drugs: Secondary | ICD-10-CM | POA: Diagnosis not present

## 2021-04-21 DIAGNOSIS — E119 Type 2 diabetes mellitus without complications: Secondary | ICD-10-CM | POA: Diagnosis not present

## 2021-04-22 DIAGNOSIS — E119 Type 2 diabetes mellitus without complications: Secondary | ICD-10-CM | POA: Diagnosis not present

## 2021-04-22 DIAGNOSIS — I119 Hypertensive heart disease without heart failure: Secondary | ICD-10-CM | POA: Diagnosis not present

## 2021-04-22 DIAGNOSIS — I472 Ventricular tachycardia: Secondary | ICD-10-CM | POA: Diagnosis not present

## 2021-04-22 DIAGNOSIS — E7849 Other hyperlipidemia: Secondary | ICD-10-CM | POA: Diagnosis not present

## 2021-04-22 DIAGNOSIS — M179 Osteoarthritis of knee, unspecified: Secondary | ICD-10-CM | POA: Diagnosis not present

## 2021-04-22 DIAGNOSIS — Z96652 Presence of left artificial knee joint: Secondary | ICD-10-CM | POA: Diagnosis not present

## 2021-04-22 DIAGNOSIS — Z7984 Long term (current) use of oral hypoglycemic drugs: Secondary | ICD-10-CM | POA: Diagnosis not present

## 2021-04-22 DIAGNOSIS — I493 Ventricular premature depolarization: Secondary | ICD-10-CM | POA: Diagnosis not present

## 2021-04-22 DIAGNOSIS — I351 Nonrheumatic aortic (valve) insufficiency: Secondary | ICD-10-CM | POA: Diagnosis not present

## 2021-04-23 ENCOUNTER — Encounter: Payer: Self-pay | Admitting: Family Medicine

## 2021-04-23 ENCOUNTER — Ambulatory Visit (INDEPENDENT_AMBULATORY_CARE_PROVIDER_SITE_OTHER): Payer: Medicare Other | Admitting: Family Medicine

## 2021-04-23 ENCOUNTER — Other Ambulatory Visit: Payer: Self-pay

## 2021-04-23 VITALS — BP 120/80 | HR 68 | Ht 65.0 in | Wt 163.0 lb

## 2021-04-23 DIAGNOSIS — I10 Essential (primary) hypertension: Secondary | ICD-10-CM

## 2021-04-23 DIAGNOSIS — E860 Dehydration: Secondary | ICD-10-CM

## 2021-04-23 DIAGNOSIS — R42 Dizziness and giddiness: Secondary | ICD-10-CM

## 2021-04-23 DIAGNOSIS — M7592 Shoulder lesion, unspecified, left shoulder: Secondary | ICD-10-CM | POA: Diagnosis not present

## 2021-04-23 DIAGNOSIS — I471 Supraventricular tachycardia: Secondary | ICD-10-CM

## 2021-04-23 NOTE — Progress Notes (Signed)
Date:  04/23/2021   Name:  [name]   DOB:  1932-11-08   MRN:  [id number]   Chief Complaint: Follow-up  Patient is a 87 year old female who presents for a hospital followup exam. The patient reports the following problems: left shoulder pain. Health maintenance has been reviewed up to date.   Lab Results  Component Value Date   CREATININE 0.51 04/13/2021   BUN 23 04/13/2021   NA 138 04/13/2021   K 4.6 04/13/2021   CL 108 04/13/2021   CO2 23 04/13/2021   Lab Results  Component Value Date   CHOL 193 02/04/2021   HDL 70 02/04/2021   LDLCALC 108 (H) 02/04/2021   TRIG 82 02/04/2021   CHOLHDL 2.2 07/23/2019   Lab Results  Component Value Date   TSH 1.824 04/13/2021   Lab Results  Component Value Date   HGBA1C 6.9 (H) 02/04/2021   Lab Results  Component Value Date   WBC 5.4 04/13/2021   HGB 10.6 (L) 04/13/2021   HCT 33.3 (L) 04/13/2021   MCV 84.3 04/13/2021   PLT 215 04/13/2021   Lab Results  Component Value Date   ALT 12 (L) 02/05/2018   AST 14 (L) 02/05/2018   ALKPHOS 78 02/05/2018   BILITOT 0.5 02/05/2018     Review of Systems  Constitutional: Negative.  Negative for chills, fatigue, fever and unexpected weight change.  HENT: Negative for congestion, ear discharge, ear pain, rhinorrhea, sinus pressure, sneezing and sore throat.   Eyes: Negative for photophobia, pain, discharge, redness and itching.  Respiratory: Negative for cough, shortness of breath, wheezing and stridor.   Gastrointestinal: Negative for abdominal pain, blood in stool, constipation, diarrhea, nausea and vomiting.  Endocrine: Negative for cold intolerance, heat intolerance, polydipsia, polyphagia and polyuria.  Genitourinary: Negative for dysuria, flank pain, frequency, hematuria, menstrual problem, pelvic pain, urgency, vaginal bleeding and vaginal discharge.  Musculoskeletal: Negative for arthralgias, back pain and myalgias.  Skin: Negative for rash.  Allergic/Immunologic:  Negative for environmental allergies and food allergies.  Neurological: Negative for dizziness, weakness, light-headedness, numbness and headaches.  Hematological: Negative for adenopathy. Does not bruise/bleed easily.  Psychiatric/Behavioral: Negative for dysphoric mood. The patient is not nervous/anxious.     Patient Active Problem List   Diagnosis Date Noted  . Near syncope 04/12/2021  . Pain due to total left knee replacement (HCC) 03/02/2021  . Aortic valve regurgitation 10/23/2020  . Bradycardia 09/04/2019  . PVC's (premature ventricular contractions) 09/04/2019  . Leg edema 09/04/2019  . Sinus bradycardia 03/16/2018  . PSVT (paroxysmal supraventricular tachycardia) (HCC) 03/16/2018  . NSVT (nonsustained ventricular tachycardia) (HCC) 03/16/2018  . 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 Hyperlipidemia 09/24/2015  . Diabetes mellitus with no complication (HCC) 03/16/2015  . 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 Arthritis of knee, degenerative 03/16/2015  . Sinus infection 03/16/2015  . Diabetes (HCC)   . Essential hypertension   . GERD (gastroesophageal reflux disease)   . Status post total left knee replacement     Allergies  Allergen Reactions  . Nsaids   . Lisinopril     Cough    Past Surgical History:  Procedure Laterality Date  . BACK SURGERY    . CATARACT EXTRACTION Bilateral 03/2016  . CERVICAL DISC SURGERY  12/28/2010   "bulging disc; went in on the left side of my neck" (08/19/2013)  . JOINT REPLACEMENT    . TOTAL KNEE ARTHROPLASTY Left 03/28/2011  Dr Thurston Hole  . TOTAL KNEE ARTHROPLASTY Right 08/19/2013  . TOTAL KNEE ARTHROPLASTY Right 08/19/2013   Procedure: TOTAL KNEE ARTHROPLASTY;  Surgeon: Nilda Simmer, MD;  Location: [facility] OR;  Service: Orthopedics;  Laterality: Right;    Social History   Tobacco Use  . Smoking status: Never Smoker  . Smokeless tobacco: Never Used  Vaping Use  .  Vaping Use: Never used  Substance Use Topics  . Alcohol use: Not Currently  . Drug use: Never     Medication list has been reviewed and updated.  Current Meds  Medication Sig  . carbamide peroxide (DEBROX) 6.5 % OTIC solution Place 5 drops into both ears 2 (two) times daily.  . clobetasol (TEMOVATE) 0.05 % external solution APPLY TOPICALLY 2 TIMES A DAY FOR SPOT TREATMENT  . cyanocobalamin 100 MCG tablet Take 1 tablet by mouth daily.   . diclofenac sodium (VOLTAREN) 1 % GEL Apply 4 g topically 4 (four) times daily as needed.  . docusate sodium (COLACE) 50 MG capsule Take 1 capsule (50 mg total) by mouth 2 (two) times daily.  Marland Kitchen glucose blood (ONE TOUCH ULTRA TEST) test strip USE 1 STRIP TO CHECK GLUCOSE ONCE DAILY  . metFORMIN (GLUCOPHAGE) 500 MG tablet TAKE 1 TABLET BY MOUTH TWICE DAILY WITH MEALS (LAST  TIME  FILLING)  . mometasone (NASONEX) 50 MCG/ACT nasal spray Place 2 sprays into the nose daily.  . montelukast (SINGULAIR) 10 MG tablet Take 1 tablet (10 mg total) by mouth at bedtime.  Marland Kitchen omeprazole (PRILOSEC) 20 MG capsule Take 1 capsule (20 mg total) by mouth daily.    PHQ 2/9 Scores 04/23/2021 03/12/2021 08/17/2020 03/18/2020  PHQ - 2 Score 0 0 0 0  PHQ- 9 Score 0 0 - 0    GAD 7 : Generalized Anxiety Score 04/23/2021 03/12/2021 03/18/2020  Nervous, Anxious, on Edge 0 0 0  Control/stop worrying 0 0 0  Worry too much - different things 0 0 0  Trouble relaxing 0 0 0  Restless 0 0 0  Easily annoyed or irritable 0 0 0  Afraid - awful might happen 0 0 0  Total GAD 7 Score 0 0 0    BP Readings from Last 3 Encounters:  04/23/21 120/80  04/14/21 (!) 121/53  03/22/21 120/70    Physical Exam Vitals and nursing note reviewed.  Constitutional:      Appearance: She is well-developed.  HENT:     Head: Normocephalic.     Right Ear: Tympanic membrane, ear canal and external ear normal.     Left Ear: Tympanic membrane, ear canal and external ear normal.     Nose: Nose normal. No  congestion or rhinorrhea.     Mouth/Throat:     Mouth: Mucous membranes are moist.  Eyes:     General: Lids are everted, no foreign bodies appreciated. No scleral icterus.       Left eye: No foreign body or hordeolum.     Conjunctiva/sclera: Conjunctivae normal.     Right eye: Right conjunctiva is not injected.     Left eye: Left conjunctiva is not injected.     Pupils: Pupils are equal, round, and reactive to light.  Neck:     Thyroid: No thyromegaly.     Vascular: No JVD.     Trachea: No tracheal deviation.  Cardiovascular:     Rate and Rhythm: Normal rate and regular rhythm.     Heart sounds: Normal heart sounds. No murmur heard. No friction  rub. No gallop.   Pulmonary:     Effort: Pulmonary effort is normal. No respiratory distress.     Breath sounds: Normal breath sounds. No wheezing, rhonchi or rales.  Abdominal:     General: Bowel sounds are normal.     Palpations: Abdomen is soft. There is no mass.     Tenderness: There is no abdominal tenderness. There is no guarding or rebound.  Musculoskeletal:        General: No tenderness. Normal range of motion.     Cervical back: Normal range of motion and neck supple.  Lymphadenopathy:     Cervical: No cervical adenopathy.  Skin:    General: Skin is warm.     Findings: No rash.  Neurological:     Mental Status: She is alert and oriented to person, place, and time.     Cranial Nerves: No cranial nerve deficit.     Deep Tendon Reflexes: Reflexes normal.  Psychiatric:        Mood and Affect: Mood is not anxious or depressed.     Wt Readings from Last 3 Encounters:  04/23/21 163 lb (73.9 kg)  04/14/21 157 lb 6.5 oz (71.4 kg)  03/22/21 161 lb (73 kg)    BP 120/80   Pulse 68   Ht 5\' 5"  (1.651 m)   Wt 163 lb (73.9 kg)   BMI 27.12 kg/m   Assessment and Plan: 1. Dehydration, mild Chronic.  Mild.  Stable.  Patient's improved but she still has some dehydration with mucous membranes having some decreased moisture.  I have  encouraged the patient to have fluids throughout the day as that her thirst mechanism probably is not at optimal functioning as of yet.  2. Nonsustained supraventricular tachycardia [facility]) Patient is being followed by cardiology for nonsustained and supraventricular tachycardia.  She currently has a Holter device on and will be rechecked on the 21st of this month by cardiology for further evaluation  3. Orthostatic dizziness Patient's not having any orthostatic symptoms when she has a change in position and we will continue to hold any antihypertensive medications at this time.  4. Essential hypertension Chronic.  Controlled.  Stable.  On no medication and in the meantime we will control with low-sodium diet.  5. Left supraspinatus tendinitis New onset.  Persistent.  Stable.  Tenderness over insertion of the supraspinatus tendon.  Versus versus possibility of deltoid bursitis.  If this should continue she has seen Dr. 22 in the past and I have encouraged her to call him that he would be more than happy for evaluation and perhaps treatment thereof

## 2021-04-28 DIAGNOSIS — I119 Hypertensive heart disease without heart failure: Secondary | ICD-10-CM | POA: Diagnosis not present

## 2021-04-28 DIAGNOSIS — Z7984 Long term (current) use of oral hypoglycemic drugs: Secondary | ICD-10-CM | POA: Diagnosis not present

## 2021-04-28 DIAGNOSIS — E119 Type 2 diabetes mellitus without complications: Secondary | ICD-10-CM | POA: Diagnosis not present

## 2021-04-28 DIAGNOSIS — E7849 Other hyperlipidemia: Secondary | ICD-10-CM | POA: Diagnosis not present

## 2021-04-28 DIAGNOSIS — I351 Nonrheumatic aortic (valve) insufficiency: Secondary | ICD-10-CM | POA: Diagnosis not present

## 2021-04-28 DIAGNOSIS — I493 Ventricular premature depolarization: Secondary | ICD-10-CM | POA: Diagnosis not present

## 2021-04-28 DIAGNOSIS — M179 Osteoarthritis of knee, unspecified: Secondary | ICD-10-CM | POA: Diagnosis not present

## 2021-04-28 DIAGNOSIS — Z96652 Presence of left artificial knee joint: Secondary | ICD-10-CM | POA: Diagnosis not present

## 2021-04-28 DIAGNOSIS — I472 Ventricular tachycardia: Secondary | ICD-10-CM | POA: Diagnosis not present

## 2021-04-30 DIAGNOSIS — Z96652 Presence of left artificial knee joint: Secondary | ICD-10-CM | POA: Diagnosis not present

## 2021-04-30 DIAGNOSIS — Z7984 Long term (current) use of oral hypoglycemic drugs: Secondary | ICD-10-CM | POA: Diagnosis not present

## 2021-04-30 DIAGNOSIS — E7849 Other hyperlipidemia: Secondary | ICD-10-CM | POA: Diagnosis not present

## 2021-04-30 DIAGNOSIS — E119 Type 2 diabetes mellitus without complications: Secondary | ICD-10-CM | POA: Diagnosis not present

## 2021-04-30 DIAGNOSIS — I493 Ventricular premature depolarization: Secondary | ICD-10-CM | POA: Diagnosis not present

## 2021-04-30 DIAGNOSIS — M179 Osteoarthritis of knee, unspecified: Secondary | ICD-10-CM | POA: Diagnosis not present

## 2021-04-30 DIAGNOSIS — I472 Ventricular tachycardia: Secondary | ICD-10-CM | POA: Diagnosis not present

## 2021-04-30 DIAGNOSIS — I351 Nonrheumatic aortic (valve) insufficiency: Secondary | ICD-10-CM | POA: Diagnosis not present

## 2021-04-30 DIAGNOSIS — I119 Hypertensive heart disease without heart failure: Secondary | ICD-10-CM | POA: Diagnosis not present

## 2021-05-03 ENCOUNTER — Ambulatory Visit: Payer: Medicare Other | Admitting: Family Medicine

## 2021-05-03 DIAGNOSIS — E119 Type 2 diabetes mellitus without complications: Secondary | ICD-10-CM | POA: Diagnosis not present

## 2021-05-03 DIAGNOSIS — E7849 Other hyperlipidemia: Secondary | ICD-10-CM | POA: Diagnosis not present

## 2021-05-03 DIAGNOSIS — Z7984 Long term (current) use of oral hypoglycemic drugs: Secondary | ICD-10-CM | POA: Diagnosis not present

## 2021-05-03 DIAGNOSIS — Z96652 Presence of left artificial knee joint: Secondary | ICD-10-CM | POA: Diagnosis not present

## 2021-05-03 DIAGNOSIS — I493 Ventricular premature depolarization: Secondary | ICD-10-CM | POA: Diagnosis not present

## 2021-05-03 DIAGNOSIS — I119 Hypertensive heart disease without heart failure: Secondary | ICD-10-CM | POA: Diagnosis not present

## 2021-05-03 DIAGNOSIS — I351 Nonrheumatic aortic (valve) insufficiency: Secondary | ICD-10-CM | POA: Diagnosis not present

## 2021-05-03 DIAGNOSIS — M179 Osteoarthritis of knee, unspecified: Secondary | ICD-10-CM | POA: Diagnosis not present

## 2021-05-03 DIAGNOSIS — I472 Ventricular tachycardia: Secondary | ICD-10-CM | POA: Diagnosis not present

## 2021-05-04 NOTE — Addendum Note (Signed)
Encounter addended by: Oneida Arenas on: 05/04/2021 3:14 PM  Actions taken: Imaging Exam ended

## 2021-05-05 DIAGNOSIS — E7849 Other hyperlipidemia: Secondary | ICD-10-CM | POA: Diagnosis not present

## 2021-05-05 DIAGNOSIS — E119 Type 2 diabetes mellitus without complications: Secondary | ICD-10-CM | POA: Diagnosis not present

## 2021-05-05 DIAGNOSIS — M179 Osteoarthritis of knee, unspecified: Secondary | ICD-10-CM | POA: Diagnosis not present

## 2021-05-05 DIAGNOSIS — I472 Ventricular tachycardia: Secondary | ICD-10-CM | POA: Diagnosis not present

## 2021-05-05 DIAGNOSIS — I119 Hypertensive heart disease without heart failure: Secondary | ICD-10-CM | POA: Diagnosis not present

## 2021-05-05 DIAGNOSIS — I351 Nonrheumatic aortic (valve) insufficiency: Secondary | ICD-10-CM | POA: Diagnosis not present

## 2021-05-05 DIAGNOSIS — Z7984 Long term (current) use of oral hypoglycemic drugs: Secondary | ICD-10-CM | POA: Diagnosis not present

## 2021-05-05 DIAGNOSIS — I493 Ventricular premature depolarization: Secondary | ICD-10-CM | POA: Diagnosis not present

## 2021-05-05 DIAGNOSIS — Z96652 Presence of left artificial knee joint: Secondary | ICD-10-CM | POA: Diagnosis not present

## 2021-05-06 DIAGNOSIS — I119 Hypertensive heart disease without heart failure: Secondary | ICD-10-CM | POA: Diagnosis not present

## 2021-05-06 DIAGNOSIS — Z96652 Presence of left artificial knee joint: Secondary | ICD-10-CM | POA: Diagnosis not present

## 2021-05-06 DIAGNOSIS — I351 Nonrheumatic aortic (valve) insufficiency: Secondary | ICD-10-CM | POA: Diagnosis not present

## 2021-05-06 DIAGNOSIS — Z7984 Long term (current) use of oral hypoglycemic drugs: Secondary | ICD-10-CM | POA: Diagnosis not present

## 2021-05-06 DIAGNOSIS — E7849 Other hyperlipidemia: Secondary | ICD-10-CM | POA: Diagnosis not present

## 2021-05-06 DIAGNOSIS — I472 Ventricular tachycardia: Secondary | ICD-10-CM | POA: Diagnosis not present

## 2021-05-06 DIAGNOSIS — I493 Ventricular premature depolarization: Secondary | ICD-10-CM | POA: Diagnosis not present

## 2021-05-06 DIAGNOSIS — M179 Osteoarthritis of knee, unspecified: Secondary | ICD-10-CM | POA: Diagnosis not present

## 2021-05-06 DIAGNOSIS — E119 Type 2 diabetes mellitus without complications: Secondary | ICD-10-CM | POA: Diagnosis not present

## 2021-05-10 ENCOUNTER — Ambulatory Visit (INDEPENDENT_AMBULATORY_CARE_PROVIDER_SITE_OTHER): Payer: Medicare Other | Admitting: Physician Assistant

## 2021-05-10 ENCOUNTER — Encounter: Payer: Self-pay | Admitting: Physician Assistant

## 2021-05-10 ENCOUNTER — Other Ambulatory Visit: Payer: Self-pay

## 2021-05-10 VITALS — BP 170/80 | HR 63 | Ht 65.0 in | Wt 176.0 lb

## 2021-05-10 DIAGNOSIS — R001 Bradycardia, unspecified: Secondary | ICD-10-CM | POA: Diagnosis not present

## 2021-05-10 DIAGNOSIS — I493 Ventricular premature depolarization: Secondary | ICD-10-CM

## 2021-05-10 DIAGNOSIS — I340 Nonrheumatic mitral (valve) insufficiency: Secondary | ICD-10-CM

## 2021-05-10 DIAGNOSIS — I472 Ventricular tachycardia: Secondary | ICD-10-CM | POA: Diagnosis not present

## 2021-05-10 DIAGNOSIS — I10 Essential (primary) hypertension: Secondary | ICD-10-CM | POA: Diagnosis not present

## 2021-05-10 DIAGNOSIS — I351 Nonrheumatic aortic (valve) insufficiency: Secondary | ICD-10-CM

## 2021-05-10 DIAGNOSIS — I471 Supraventricular tachycardia, unspecified: Secondary | ICD-10-CM

## 2021-05-10 DIAGNOSIS — I4729 Other ventricular tachycardia: Secondary | ICD-10-CM

## 2021-05-10 MED ORDER — OLMESARTAN MEDOXOMIL 20 MG PO TABS: 20.0000 mg | ORAL_TABLET | Freq: Every day | ORAL | 3 refills | Status: DC

## 2021-05-10 NOTE — Patient Instructions (Signed)
Medication Instructions:  Your physician has recommended you make the following change in your medication:   START Olmesartan 20mg  daily at bedtime  *If you need a refill on your cardiac medications before your next appointment, please call your pharmacy*   Lab Work:  BMET, Magnesium today  If you have labs (blood work) drawn today and your tests are completely normal, you will receive your results only by: MyChart Message (if you have MyChart) OR A paper copy in the mail If you have any lab test that is abnormal or we need to change your treatment, we will call you to review the results.   Testing/Procedures:  None ordered   Follow-Up: At [facility], you and your health needs are our priority.  As part of our continuing mission to provide you with exceptional heart care, we have created designated Provider Care Teams.  These Care Teams include your primary Cardiologist (physician) and Advanced Practice Providers (APPs -  Physician Assistants and Nurse Practitioners) who all work together to provide you with the care you need, when you need it.  We recommend signing up for the patient portal called "MyChart".  Sign up information is provided on this After Visit Summary.  MyChart is used to connect with patients for Virtual Visits (Telemedicine).  Patients are able to view lab/test results, encounter notes, upcoming appointments, etc.  Non-urgent messages can be sent to your provider as well.   To learn more about what you can do with MyChart, go to [facility].    Your next appointment:   2 week(s) w/ BMET  The format for your next appointment:   In Person  Provider:   You may see [email or web address], MD or one of the following Advanced Practice Providers on your designated Care Team:    Yvonne Kendall, Marisue Ivan    Other Instructions  BP cuff given today in office  Please monitor BP at home with goal BP 130/80

## 2021-05-10 NOTE — Progress Notes (Signed)
Office Visit    Patient Name: [name] Date of Encounter: 05/10/2021  PCP:  Duanne Limerick, MD   [facility]  Cardiologist:  Yvonne Kendall, MD  Advanced Practice Provider:  No care team member to display Electrophysiologist:  None :[id number]}   Chief Complaint    Chief Complaint  Patient presents with   Hospitalization Follow-up    Hospital follow up. Medications verbally reviewed with patient.    87 y.o. female  with a hx of hypertension, aortic regurgitation, DM2, bradycardia, PVCs, NSVT, paroxysmal SVT, and who is being seen today for hospital follow-up and review of cardiac monitoring.   Past Medical History    Past Medical History:  Diagnosis Date   GERD (gastroesophageal reflux disease)    Hypertension    Right knee DJD    Type II diabetes mellitus (HCC)    Past Surgical History:  Procedure Laterality Date   BACK SURGERY     CATARACT EXTRACTION Bilateral 03/2016   CERVICAL DISC SURGERY  12/28/2010   "bulging disc; went in on the left side of my neck" (08/19/2013)   JOINT REPLACEMENT     TOTAL KNEE ARTHROPLASTY Left 03/28/2011   Dr Thurston Hole   TOTAL KNEE ARTHROPLASTY Right 08/19/2013   TOTAL KNEE ARTHROPLASTY Right 08/19/2013   Procedure: TOTAL KNEE ARTHROPLASTY;  Surgeon: Nilda Simmer, MD;  Location: [facility] OR;  Service: Orthopedics;  Laterality: Right;    Allergies  Allergies  Allergen Reactions   Nsaids    Lisinopril     Cough    History of Present Illness    [name] is a 87 y.o. female with PMH as above. [name] is an 87 year old female with PMH as above.  Previous 01/2018 echo showed EF 65 to 70%, G1 DD, mild to moderate MR, mild LAE, PASP 35 to 40 mmHg plus central venous/RAP.  Previous cardiac monitoring 01/2018 showed rate 43 to 152 bpm with occasional PACs and PVCs.  Brief episodes of SVT and nonsustained VT noted.  No sustained arrhythmias or prolonged pauses.   She is followed by [facility] cardiology with last  office visit 10/23/2020.  No CP/SOB.  She was not checking her BP at home.  She had not fallen recently and was tolerating her medications well.  It was noted she should continue to avoid rate controlling agents due to bradycardia.  Due to moderate AR seen on her echo in 2019 with pt asx other than dependent edema (graded 1+ on exam), repeat echo was recommended before follow-up 10/2021.  She was instructed to call the office if any new symptoms develop.  BP was well controlled without any medication changes. EKG showed NSR, chronic LBBB, and PVCs with patient asymptomatic and further work-up deferred other than follow-up echo before the next visit.   She was recently seen in the outpatient setting and her amlodipine was discontinued.   On 03/22/2021, she was seen by her PCP with notes reporting dizziness when moving from sitting to standing.  BP was noted to be 120/70.  Her amlodipine 5 mg daily was discontinued.  Increased hydration was recommended, though notes indicate tea and soft drinks were likely the beverage of choice to be increased by the patient.  It is unclear if water was encouraged over the and soft drinks or if tea. No documented precautions provided regarding influence caffeine on hydration or of soft drinks on any underlying GERD.   On 04/12/2021, she presented to [facility] ED after  becoming lightheaded and nauseated while standing that day.  Her symptoms only lasted for approximately 1 minute and resolved shortly after returning to the seated position.  She reported some epigastric pain and 1 episode of emesis that day.  In the ED, initial SBP 150s to 170s. SB noted at 55 bpm and known history of asymptomatic bradycardia.  She was mildly febrile with temperature 99.6 F.  UA with rare bacteria without leukocytes.  Orthostatics negative.   telemetry showed sinus bradycardia, PVCs, and a single atrial run lasting 14 seconds with a rate of approximately 100 bpm.  EKG showed Sinus bradycardia, 58 bpm,  occasional PVCs, QRS 116 with known LBBB, PR interval prolonged at 194 ms, poor R wave progression noted in the inferior and precordial leads, no acute ST/T changes.  Echo was reassuring with normal LVEF and only mild regurgitant valvular heart disease (EF 60 to 65%, G1 DD, mild MR and AR).  Orthostatic BP vitals and elevated BP suggested that dehydration and overmedication were not causing her lightheadedness.  It was thought that PVCs and bradycardia could be contributing to her symptoms.  14-day event monitor was recommended at discharge.  Ambulatory cardiac monitoring with Zio placed at discharge showed predominantly NSR with minimum heart rate 47 bpm, maximum 179 bpm and average heart rate 64 bpm.  Bundle branch block and IVCD was present.  106 supraventricular tachycardia runs occurred, the fastest of which lasted 4 beats with maximum rate 179 bpm and the longest 4 minutes 11 seconds with average rate 111 bpm.  Isolated PACs were frequent (8.6%, 111,213), atrial couplets were occasional (1.4%, 9304), and atrial triplets were rare (less than 1%, 3465).  Isolated PVCs were rare and less than 1%.    Today, she returns to clinic to review her cardiac monitoring.  She is joined today by her daughter.  BP today significantly elevated at 170/80.  She is asymptomatic with this elevated BP. Olmesartan not restarted at discharge with recommendations to restart as below. She denies any chest pain or shortness of breath at rest or with exertion.  No tachypalpitations, despite ectopy and SVT runs as above.  No presyncope or syncope.  She denies any further episodes of dizziness or nausea as reported during her admission.  No recent falls.  We reviewed restrictions for salt and fluid intake in great detail.  Soda intake discussed, as well as her coffee intake.  Also reviewed were the monitor results as above.  Given her frequency of PACs and SVT runs, we discussed options moving forward and as recommended below.  She  reports medication compliance.  No signs or symptoms of bleeding.  Home Medications   Current Outpatient Medications  Medication Instructions   carbamide peroxide (DEBROX) 6.5 % OTIC solution 5 drops, Both EARS, 2 times daily   clobetasol (TEMOVATE) 0.05 % external solution APPLY TOPICALLY 2 TIMES A DAY FOR SPOT TREATMENT   cyanocobalamin 100 MCG tablet 1 tablet, Oral, Daily   diclofenac sodium (VOLTAREN) 4 g, Topical, 4 times daily PRN   docusate sodium (COLACE) 50 mg, Oral, 2 times daily   glucose blood (ONE TOUCH ULTRA TEST) test strip USE 1 STRIP TO CHECK GLUCOSE ONCE DAILY   metFORMIN (GLUCOPHAGE) 500 MG tablet TAKE 1 TABLET BY MOUTH TWICE DAILY WITH MEALS (LAST  TIME  FILLING)   mometasone (NASONEX) 50 MCG/ACT nasal spray 2 sprays, Nasal, Daily   montelukast (SINGULAIR) 10 mg, Oral, Daily at bedtime   omeprazole (PRILOSEC) 20 mg, Oral, Daily  Review of Systems    She denies chest pain, palpitations, dyspnea, pnd, orthopnea, n, v, dizziness, syncope, edema, weight gain, or early satiety.   All other systems reviewed and are otherwise negative except as noted above.  Physical Exam    VS:  BP (!) 170/80 (BP Location: Right Arm, Patient Position: Sitting, Cuff Size: Normal)   Pulse 63   Ht 5\' 5"  (1.651 m)   Wt 176 lb (79.8 kg)   SpO2 98%   BMI 29.29 kg/m  , BMI Body mass index is 29.29 kg/m. GEN: Well nourished, well developed, in no acute distress.  Joined today by her daughter. HEENT: normal. Neck: Supple, no JVD, carotid bruits, or masses. Cardiac: RRR with extrasystole, no murmurs, rubs, or gallops. No clubbing, cyanosis, no bilateral pitting edema.  Radials/DP/PT 2+ and equal bilaterally.  Respiratory:  Respirations regular and unlabored, clear to auscultation bilaterally. GI: Soft, nontender, nondistended, BS + x 4. MS: no deformity or atrophy. Skin: warm and dry, no rash. Neuro:  Strength and sensation are intact. Psych: Normal affect.  Accessory Clinical  Findings    ECG personally reviewed by me today - SR with PVCs and compensatory pauses, 63 bpm, LBBB (known), prolonged Pri at , LVH with repolarization abnormalities, TWI in lateral I and avL, V5-6, poor R wave in precordial anteroseptal leads, inferior III, avF - no acute changes.  VITALS Reviewed today   Temp Readings from Last 3 Encounters:  04/14/21 98.1 F (36.7 C) (Oral)  03/12/21 98.3 F (36.8 C) (Oral)  08/17/20 (!) 97.4 F (36.3 C) (Oral)   BP Readings from Last 3 Encounters:  05/10/21 (!) 170/80  04/23/21 120/80  04/14/21 (!) 121/53   Pulse Readings from Last 3 Encounters:  05/10/21 63  04/23/21 68  04/14/21 (!) 59    Wt Readings from Last 3 Encounters:  05/10/21 176 lb (79.8 kg)  04/23/21 163 lb (73.9 kg)  04/14/21 157 lb 6.5 oz (71.4 kg)     LABS  reviewed today   Lab Results  Component Value Date   WBC 5.4 04/13/2021   HGB 10.6 (L) 04/13/2021   HCT 33.3 (L) 04/13/2021   MCV 84.3 04/13/2021   PLT 215 04/13/2021   Lab Results  Component Value Date   CREATININE 0.51 04/13/2021   BUN 23 04/13/2021   NA 138 04/13/2021   K 4.6 04/13/2021   CL 108 04/13/2021   CO2 23 04/13/2021   Lab Results  Component Value Date   ALT 12 (L) 02/05/2018   AST 14 (L) 02/05/2018   ALKPHOS 78 02/05/2018   BILITOT 0.5 02/05/2018   Lab Results  Component Value Date   CHOL 193 02/04/2021   HDL 70 02/04/2021   LDLCALC 108 (H) 02/04/2021   TRIG 82 02/04/2021   CHOLHDL 2.2 07/23/2019    Lab Results  Component Value Date   HGBA1C 6.9 (H) 02/04/2021   Lab Results  Component Value Date   TSH 1.824 04/13/2021     STUDIES/PROCEDURES reviewed today   Cardiac monitoring/Zio XT 04/14/2021 Patient had a min HR of 47 bpm, max HR of 179 bpm, and avg HR of 64 bpm. Predominant underlying rhythm was Sinus Rhythm. Bundle Branch Block/IVCD was present. 106 Supraventricular Tachycardia runs occurred, the run with the fastest interval lasting 4 beats with a max rate of  179 bpm, the longest lasting 4 mins 11 secs with an avg rate of 111 bpm. Isolated SVEs were frequent (8.6%, 04/16/2021), SVE Couplets were occasional (1.4%, 9304),  and SVE Triplets were rare (<1.0%, 3465). Isolated VEs were rare (<1.0%), and no VE Couplets or VE Triplets were present.  Echocardiogram 04/13/2021  1. Left ventricular ejection fraction, by estimation, is 60 to 65%. The  left ventricle has normal function. Left ventricular endocardial border  not optimally defined to evaluate regional wall motion. There is mild left  ventricular hypertrophy. Left  ventricular diastolic parameters are consistent with Grade I diastolic  dysfunction (impaired relaxation).   2. Right ventricular systolic function was not well visualized. The right  ventricular size is normal.   3. The mitral valve is normal in structure. Mild mitral valve  regurgitation. No evidence of mitral stenosis.   4. The aortic valve was not well visualized. Aortic valve regurgitation  is mild. No aortic stenosis is present.   FINDINGS   Left Ventricle: Left ventricular ejection fraction, by estimation, is 60  to 65%. The left ventricle has normal function. Left ventricular  endocardial border not optimally defined to evaluate regional wall motion.  The left ventricular internal cavity  size was normal in size. There is mild left ventricular hypertrophy.  Abnormal (paradoxical) septal motion, consistent with left bundle branch  block. Left ventricular diastolic parameters are consistent with Grade I  diastolic dysfunction (impaired  relaxation).   Echo 01/2018 - Procedure narrative: Transthoracic echocardiography. Image    quality was suboptimal.  - Left ventricle: The cavity size was normal. Wall thickness could    not be accurately determined. Systolic function was vigorous. The    estimated ejection fraction was in the range of 65% to 70%.    Doppler parameters are consistent with abnormal left ventricular     relaxation (grade 1 diastolic dysfunction). Doppler parameters    are consistent with high ventricular filling pressure.  - Aortic valve: There was probably mild to moderate regurgitation,    though suboptimal acoustic windows limit evaluation.  - Left atrium: The atrium was mildly dilated.  - Right ventricle: The cavity size was normal. Systolic function    was normal.  - Pulmonary arteries: Systolic pressure was mildly to moderately    increased, in the range of 35 mm Hg to 40 mm Hg plus central    venous/right atrial pressure.    Ambulatory cardiac monitoring 01/2018 The patient was monitored for 14 days. The predominant rhythm was sinus with intermittent intraventricular conduction delay. The average rate was 66 bpm (range 43-152 bpm). Occasional PVC's and rare PAC's were observed. Five episodes of non-sustained ventricular tachycardia versus SVT with aberrancy were seen, lasting up to 9 beats. Nine episodes of supraventricular tachycardia were seen, lasting up to 7 beats. There were no sustained arrhythmias or prolonged pauses. The patient did not report any symptoms.   Predominantly sinus rhythm with occasional PVC's and rare PAC's.  Brief runs of SVT and NSVT noted.    Assessment & Plan   SVT, PACs, PVCs Sinus bradycardia LBBB/IVCD -- She is asymptomatic with her bradycardia, SVT, and PACs/PVCs.  Recent admission with nausea and dizziness, resolved since discharge.  Zio placed at discharge and no significant sustained arrhythmia or pauses. Zio with NSR and 106 SVT runs (fastest 179 bpm, longest 32m and 11s.  PACs occurred at 8.6% with occasional couplets and rare triplets.  Isolated PVCs were rare.  EKG today with PVCs, known LBBB, and no new/acute ST/T changes. No new CP / SOB or recurrent nausea/dizziness since discharge. No tachypalpitations despite ectopy - no presyncope or syncope. As previously noted, MPI could be considered  but deferred given echo with nl EF and NRWMA. Will  continue to defer given asx today. Recheck electrolytes with BMET and Mg today.  Recent TSH WNL. BB deferred given prolonged Pri on EKG and history of bradycardia. We did discuss EP referral today, however, given lack of sx, pt preference was to defer for now unless recurrent sx. Reassess at RTC. Considered was sleep apnea with study deferred due to age / long term benefit discussed with tx if even present. We reviewed sx to monitor for with pt understanding. Reduce caffeine intake. Control BP as below.   Hypertension, goal BP <130/80 --Uncontrolled. Wt up today from previous EMR recorded wt recorded but actually down from previous 10/2020 clinic wt. Restart olmesartan 20mg  daily. BMET today and at RTC. They have not been monitoring BP at home with monitoring guidelines reviewed today and BP cuff provided given pt report that she could not obtain one herself. If BP still elevated after restart of ARB, recommend up-titration as Cr allows or additional antihypertensive. Salt and fluid restrictions discussed, as well as avoiding caffeine.   Dizziness and nausea, resolved --Reports resolution of previous dizziness and nausea reported during her admission.  During her admission and work-up for dizziness/nausea, orthostatic vital signs were negative and elevated BP noted, arguing against dehydration or polypharmacy as contributing to her symptoms.  It was thought that her PVCs and underlying bradycardia could be contributing to her symptoms.  Echo performed with normal LVEF and only mild AR/MR. Zio as above without significant pauses or sustained arrhythmia. EKG with LBBB (known) and no acute ST/T changes. No recurrence of nausea and dizziness since discharge.  No recent presyncope or syncope.  No recent falls.  MPI and EP referral discussed and deferred as above and can reassess at RTC.  She will call the office if any sx between visits. Given nausea, discussed reducing high acid foods as well as soda and coffee  intake that may trigger her GERD. Continue PPI. BP control recommended.  Valvular heart disease with mild mitral and aortic regurgitation --Echo as above with mild MR/AR. She is asx other than previous reported intermittent edema. HF and BP control recommended. Periodic echo to monitor.    Medication changes: Restart olmesartan 20mg  daily Labs ordered: BMET today, BMET 2 weeks or at RTC Studies / Imaging ordered: Ordered BP cuff.  Future considerations: MPI / EP referral. Disposition: RTC 2 weeks  *Please be aware that the above documentation was completed voice recognition software and may contain dictation errors.      , PA-C 05/10/2021

## 2021-05-11 ENCOUNTER — Ambulatory Visit: Payer: Medicare Other | Admitting: Physician Assistant

## 2021-05-11 DIAGNOSIS — E119 Type 2 diabetes mellitus without complications: Secondary | ICD-10-CM | POA: Diagnosis not present

## 2021-05-11 DIAGNOSIS — I493 Ventricular premature depolarization: Secondary | ICD-10-CM | POA: Diagnosis not present

## 2021-05-11 DIAGNOSIS — M179 Osteoarthritis of knee, unspecified: Secondary | ICD-10-CM | POA: Diagnosis not present

## 2021-05-11 DIAGNOSIS — I351 Nonrheumatic aortic (valve) insufficiency: Secondary | ICD-10-CM | POA: Diagnosis not present

## 2021-05-11 DIAGNOSIS — Z7984 Long term (current) use of oral hypoglycemic drugs: Secondary | ICD-10-CM | POA: Diagnosis not present

## 2021-05-11 DIAGNOSIS — Z96652 Presence of left artificial knee joint: Secondary | ICD-10-CM | POA: Diagnosis not present

## 2021-05-11 DIAGNOSIS — E7849 Other hyperlipidemia: Secondary | ICD-10-CM | POA: Diagnosis not present

## 2021-05-11 DIAGNOSIS — I119 Hypertensive heart disease without heart failure: Secondary | ICD-10-CM | POA: Diagnosis not present

## 2021-05-11 DIAGNOSIS — I472 Ventricular tachycardia: Secondary | ICD-10-CM | POA: Diagnosis not present

## 2021-05-11 LAB — BASIC METABOLIC PANEL
BUN/Creatinine Ratio: 25 (ref 12–28)
BUN: 16 mg/dL (ref 8–27)
CO2: 22 mmol/L (ref 20–29)
Calcium: 9.4 mg/dL (ref 8.7–10.3)
Chloride: 103 mmol/L (ref 96–106)
Creatinine, Ser: 0.64 mg/dL (ref 0.57–1.00)
Glucose: 119 mg/dL — ABNORMAL HIGH (ref 65–99)
Potassium: 4.9 mmol/L (ref 3.5–5.2)
Sodium: 139 mmol/L (ref 134–144)
eGFR: 85 mL/min/{1.73_m2} (ref >59)

## 2021-05-11 LAB — MAGNESIUM: Magnesium: 1.7 mg/dL (ref 1.6–2.3)

## 2021-05-13 DIAGNOSIS — I472 Ventricular tachycardia: Secondary | ICD-10-CM | POA: Diagnosis not present

## 2021-05-13 DIAGNOSIS — I493 Ventricular premature depolarization: Secondary | ICD-10-CM | POA: Diagnosis not present

## 2021-05-13 DIAGNOSIS — E119 Type 2 diabetes mellitus without complications: Secondary | ICD-10-CM | POA: Diagnosis not present

## 2021-05-13 DIAGNOSIS — Z96652 Presence of left artificial knee joint: Secondary | ICD-10-CM | POA: Diagnosis not present

## 2021-05-13 DIAGNOSIS — M179 Osteoarthritis of knee, unspecified: Secondary | ICD-10-CM | POA: Diagnosis not present

## 2021-05-13 DIAGNOSIS — Z7984 Long term (current) use of oral hypoglycemic drugs: Secondary | ICD-10-CM | POA: Diagnosis not present

## 2021-05-13 DIAGNOSIS — E7849 Other hyperlipidemia: Secondary | ICD-10-CM | POA: Diagnosis not present

## 2021-05-13 DIAGNOSIS — I119 Hypertensive heart disease without heart failure: Secondary | ICD-10-CM | POA: Diagnosis not present

## 2021-05-13 DIAGNOSIS — I351 Nonrheumatic aortic (valve) insufficiency: Secondary | ICD-10-CM | POA: Diagnosis not present

## 2021-05-14 ENCOUNTER — Ambulatory Visit (INDEPENDENT_AMBULATORY_CARE_PROVIDER_SITE_OTHER): Payer: Medicare Other | Admitting: Family Medicine

## 2021-05-14 ENCOUNTER — Encounter: Payer: Self-pay | Admitting: Family Medicine

## 2021-05-14 ENCOUNTER — Other Ambulatory Visit: Payer: Self-pay

## 2021-05-14 VITALS — BP 130/80 | HR 85 | Ht 65.0 in | Wt 167.0 lb

## 2021-05-14 DIAGNOSIS — I10 Essential (primary) hypertension: Secondary | ICD-10-CM | POA: Diagnosis not present

## 2021-05-14 MED ORDER — OLMESARTAN MEDOXOMIL 20 MG PO TABS: 20.0000 mg | ORAL_TABLET | Freq: Every day | ORAL | 1 refills | Status: DC

## 2021-05-14 NOTE — Progress Notes (Signed)
Date:  05/14/2021   Name:  [name]   DOB:  [birth year]   MRN:  [id number]   Chief Complaint: Follow-up (Was restarted on olmesartan by cardio on 05/10/21)  Hypertension This is a chronic problem. The current episode started more than 1 year ago. The problem has been waxing and waning since onset. The problem is uncontrolled. Pertinent negatives include no anxiety, blurred vision, chest pain, headaches, malaise/fatigue, neck pain, orthopnea, palpitations, peripheral edema, PND, shortness of breath or sweats. Past treatments include angiotensin blockers.   Lab Results  Component Value Date   CREATININE 0.64 05/10/2021   BUN 16 05/10/2021   NA 139 05/10/2021   K 4.9 05/10/2021   CL 103 05/10/2021   CO2 22 05/10/2021   Lab Results  Component Value Date   CHOL 193 02/04/2021   HDL 70 02/04/2021   LDLCALC 108 (H) 02/04/2021   TRIG 82 02/04/2021   CHOLHDL 2.2 07/23/2019   Lab Results  Component Value Date   TSH 1.824 04/13/2021   Lab Results  Component Value Date   HGBA1C 6.9 (H) 02/04/2021   Lab Results  Component Value Date   WBC 5.4 04/13/2021   HGB 10.6 (L) 04/13/2021   HCT 33.3 (L) 04/13/2021   MCV 84.3 04/13/2021   PLT 215 04/13/2021   Lab Results  Component Value Date   ALT 12 (L) 02/05/2018   AST 14 (L) 02/05/2018   ALKPHOS 78 02/05/2018   BILITOT 0.5 02/05/2018     Review of Systems  Constitutional:  Negative for chills, fever and malaise/fatigue.  HENT:  Negative for drooling, ear discharge, ear pain and sore throat.   Eyes:  Negative for blurred vision.  Respiratory:  Negative for cough, shortness of breath and wheezing.   Cardiovascular:  Negative for chest pain, palpitations, orthopnea, leg swelling and PND.  Gastrointestinal:  Negative for abdominal pain, blood in stool, constipation, diarrhea and nausea.  Endocrine: Negative for polydipsia.  Genitourinary:  Negative for dysuria, frequency, hematuria and urgency.  Musculoskeletal:  Negative  for back pain, myalgias and neck pain.  Skin:  Negative for rash.  Allergic/Immunologic: Negative for environmental allergies.  Neurological:  Negative for dizziness and headaches.  Hematological:  Does not bruise/bleed easily.  Psychiatric/Behavioral:  Negative for suicidal ideas. The patient is not nervous/anxious.    Patient Active Problem List   Diagnosis Date Noted   Near syncope 04/12/2021   Pain due to total left knee replacement (HCC) 03/02/2021   Aortic valve regurgitation 10/23/2020   Bradycardia 09/04/2019   PVC's (premature ventricular contractions) 09/04/2019   Leg edema 09/04/2019   Sinus bradycardia 03/16/2018   PSVT (paroxysmal supraventricular tachycardia) (HCC) 03/16/2018   NSVT (nonsustained ventricular tachycardia) (HCC) 03/16/2018   Malignant HTN with heart disease, w/o CHF, w/o chronic kidney disease 02/05/2018   Hyperlipidemia 09/24/2015   Diabetes mellitus with no complication (HCC) 03/16/2015   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   Arthritis of knee, degenerative 03/16/2015   Sinus infection 03/16/2015   Diabetes (HCC)    Essential hypertension    GERD (gastroesophageal reflux disease)    Status post total left knee replacement     Allergies  Allergen Reactions   Nsaids    Lisinopril     Cough    Past Surgical History:  Procedure Laterality Date   BACK SURGERY     CATARACT EXTRACTION Bilateral 03/2016   CERVICAL DISC SURGERY  12/28/2010   "bulging disc; went in on the left side of  my neck" (08/19/2013)   JOINT REPLACEMENT     TOTAL KNEE ARTHROPLASTY Left 03/28/2011   Dr Thurston Hole   TOTAL KNEE ARTHROPLASTY Right 08/19/2013   TOTAL KNEE ARTHROPLASTY Right 08/19/2013   Procedure: TOTAL KNEE ARTHROPLASTY;  Surgeon: Nilda Simmer, MD;  Location: [facility] OR;  Service: Orthopedics;  Laterality: Right;    Social History   Tobacco Use   Smoking status: Never   Smokeless tobacco: Never  Vaping Use   Vaping Use: Never used  Substance  Use Topics   Alcohol use: Not Currently   Drug use: Never     Medication list has been reviewed and updated.  Current Meds  Medication Sig   carbamide peroxide (DEBROX) 6.5 % OTIC solution Place 5 drops into both ears 2 (two) times daily.   cyanocobalamin 100 MCG tablet Take 1 tablet by mouth daily.    diclofenac sodium (VOLTAREN) 1 % GEL Apply 4 g topically 4 (four) times daily as needed.   docusate sodium (COLACE) 50 MG capsule Take 1 capsule (50 mg total) by mouth 2 (two) times daily.   glucose blood (ONE TOUCH ULTRA TEST) test strip USE 1 STRIP TO CHECK GLUCOSE ONCE DAILY   metFORMIN (GLUCOPHAGE) 500 MG tablet TAKE 1 TABLET BY MOUTH TWICE DAILY WITH MEALS (LAST  TIME  FILLING)   mometasone (NASONEX) 50 MCG/ACT nasal spray Place 2 sprays into the nose daily.   montelukast (SINGULAIR) 10 MG tablet Take 1 tablet (10 mg total) by mouth at bedtime.   olmesartan (BENICAR) 20 MG tablet Take 1 tablet (20 mg total) by mouth at bedtime.   omeprazole (PRILOSEC) 20 MG capsule Take 1 capsule (20 mg total) by mouth daily.    PHQ 2/9 Scores 04/23/2021 03/12/2021 08/17/2020 03/18/2020  PHQ - 2 Score 0 0 0 0  PHQ- 9 Score 0 0 - 0    GAD 7 : Generalized Anxiety Score 04/23/2021 03/12/2021 03/18/2020  Nervous, Anxious, on Edge 0 0 0  Control/stop worrying 0 0 0  Worry too much - different things 0 0 0  Trouble relaxing 0 0 0  Restless 0 0 0  Easily annoyed or irritable 0 0 0  Afraid - awful might happen 0 0 0  Total GAD 7 Score 0 0 0    BP Readings from Last 3 Encounters:  05/14/21 (!) 152/82  05/10/21 (!) 170/80  04/23/21 120/80    Physical Exam Vitals and nursing note reviewed.  Constitutional:      General: She is not in acute distress.    Appearance: She is not diaphoretic.  HENT:     Head: Normocephalic and atraumatic.     Right Ear: Tympanic membrane, ear canal and external ear normal. There is no impacted cerumen.     Left Ear: Tympanic membrane, ear canal and external ear normal.  There is no impacted cerumen.     Nose: Nose normal. No congestion or rhinorrhea.  Eyes:     General:        Right eye: No discharge.        Left eye: No discharge.     Conjunctiva/sclera: Conjunctivae normal.     Pupils: Pupils are equal, round, and reactive to light.  Neck:     Thyroid: No thyromegaly.     Vascular: No JVD.  Cardiovascular:     Rate and Rhythm: Normal rate and regular rhythm.     Heart sounds: Normal heart sounds, S1 normal and S2 normal. No murmur heard. No systolic  murmur is present.  No diastolic murmur is present.    No friction rub. No gallop. No S3 or S4 sounds.  Pulmonary:     Effort: Pulmonary effort is normal.     Breath sounds: Normal breath sounds. No wheezing or rhonchi.  Abdominal:     General: Bowel sounds are normal.     Palpations: Abdomen is soft. There is no mass.     Tenderness: There is no abdominal tenderness. There is no guarding.  Musculoskeletal:        General: Normal range of motion.     Cervical back: Normal range of motion and neck supple.  Lymphadenopathy:     Cervical: No cervical adenopathy.  Skin:    General: Skin is warm and dry.  Neurological:     Mental Status: She is alert.     Deep Tendon Reflexes: Reflexes are normal and symmetric.    Wt Readings from Last 3 Encounters:  05/14/21 167 lb (75.8 kg)  05/10/21 176 lb (79.8 kg)  04/23/21 163 lb (73.9 kg)    BP (!) 152/82   Pulse 85   Ht 5\' 5"  (1.651 m)   Wt 167 lb (75.8 kg)   SpO2 98%   BMI 27.79 kg/m   Assessment and Plan:  1. Essential hypertension Chronic.  Controlled.  Stable.  Initial blood pressure was elevated when she was pressure in a.m. but after she was stabilized for 10 minutes in a chair with feet flat on the floor blood pressure was rechecked and it was in the 130/80 range.  We will continue the olmesartan at 20 mg once a day.  It was discussed with patient that she would very much like to have her diet Coke in the morning and from a wellbeing  approach I can understand.  I told her the main problem is the calf pain and that they do make a Coke product that is decaffeinated and that 1 decaffeinated Coke beverage is considered moderation in my mind and that she may have this but she needs to push fluids including water, decaffeinated tea, and some juices throughout the day.  We will recheck patient in 6 to 8 weeks for blood pressure or if she needs to be seen prior. - olmesartan (BENICAR) 20 MG tablet; Take 1 tablet (20 mg total) by mouth at bedtime.  Dispense: 90 tablet; Refill: 1

## 2021-05-14 NOTE — Patient Instructions (Signed)
How to Take Your Blood Pressure ?Blood pressure is a measurement of how strongly your blood is pressing against the walls of your arteries. Arteries are blood vessels that carry blood from your heart throughout your body. Your health care provider takes your blood pressure at each office visit. You can also take your own blood pressure at home with a blood pressure monitor. ?You may need to take your own blood pressure to: ?Confirm a diagnosis of high blood pressure (hypertension). ?Monitor your blood pressure over time. ?Make sure your blood pressure medicine is working. ?Supplies needed: ?Blood pressure monitor. ?Dining room chair to sit in. ?Table or desk. ?Small notebook and pencil or pen. ?How to prepare ?To get the most accurate reading, avoid the following for 30 minutes before you check your blood pressure: ?Drinking caffeine. ?Drinking alcohol. ?Eating. ?Smoking. ?Exercising. ?Five minutes before you check your blood pressure: ?Use the bathroom and urinate so that you have an empty bladder. ?Sit quietly in a dining room chair. Do not sit in a soft couch or an armchair. Do not talk. ?How to take your blood pressure ?To check your blood pressure, follow the instructions in the manual that came with your blood pressure monitor. If you have a digital blood pressure monitor, the instructions may be as follows: ?Sit up straight in a chair. ?Place your feet on the floor. Do not cross your ankles or legs. ?Rest your left arm at the level of your heart on a table or desk or on the arm of a chair. ?Pull up your shirt sleeve. ?Wrap the blood pressure cuff around the upper part of your left arm, 1 inch (2.5 cm) above your elbow. It is best to wrap the cuff around bare skin. ?Fit the cuff snugly around your arm. You should be able to place only one finger between the cuff and your arm. ?Position the cord so that it rests in the bend of your elbow. ?Press the power button. ?Sit quietly while the cuff inflates and  deflates. ?Read the digital reading on the monitor screen and write the numbers down (record them) in a notebook. ?Wait 2-3 minutes, then repeat the steps, starting at step 1. ?What does my blood pressure reading mean? ?A blood pressure reading consists of a higher number over a lower number. Ideally, your blood pressure should be below 120/80. The first ("top") number is called the systolic pressure. It is a measure of the pressure in your arteries as your heart beats. The second ("bottom") number is called the diastolic pressure. It is a measure of the pressure in your arteries as the heart relaxes. ?Blood pressure is classified into five stages. The following are the stages for adults who do not have a short-term serious illness or a chronic condition. Systolic pressure and diastolic pressure are measured in a unit called mm Hg (millimeters of mercury).  ?Normal ?Systolic pressure: below 120. ?Diastolic pressure: below 80. ?Elevated ?Systolic pressure: 120-129. ?Diastolic pressure: below 80. ?Hypertension stage 1 ?Systolic pressure: 130-139. ?Diastolic pressure: 80-89. ?Hypertension stage 2 ?Systolic pressure: 140 or above. ?Diastolic pressure: 90 or above. ?You can have elevated blood pressure or hypertension even if only the systolic or only the diastolic number in your reading is higher than normal. ?Follow these instructions at home: ?Medicines ?Take over-the-counter and prescription medicines only as told by your health care provider. ?Tell your health care provider if you are having any side effects from blood pressure medicine. ?General instructions ?Check your blood pressure as often as   recommended by your health care provider. ?Check your blood pressure at the same time every day. ?Take your monitor to the next appointment with your health care provider to make sure that: ?You are using it correctly. ?It provides accurate readings. ?Understand what your goal blood pressure numbers are. ?Keep all  follow-up visits as told by your health care provider. This is important. ?General tips ?Your health care provider can suggest a reliable monitor that will meet your needs. There are several types of home blood pressure monitors. ?Choose a monitor that has an arm cuff. Do not choose a monitor that measures your blood pressure from your wrist or finger. ?Choose a cuff that wraps snugly around your upper arm. You should be able to fit only one finger between your arm and the cuff. ?You can buy a blood pressure monitor at most drugstores or online. ?Where to find more information ?American Heart Association: www.heart.org ?Contact a health care provider if: ?Your blood pressure is consistently high. ?Your blood pressure is suddenly low. ?Get help right away if: ?Your systolic blood pressure is higher than 180. ?Your diastolic blood pressure is higher than 120. ?Summary ?Blood pressure is a measurement of how strongly your blood is pressing against the walls of your arteries. ?A blood pressure reading consists of a higher number over a lower number. Ideally, your blood pressure should be below 120/80. ?Check your blood pressure at the same time every day. ?Avoid caffeine, alcohol, smoking, and exercise for 30 minutes prior to checking your blood pressure. These agents can affect the accuracy of the blood pressure reading. ?This information is not intended to replace advice given to you by your health care provider. Make sure you discuss any questions you have with your health care provider. ?Document Revised: 09/16/2020 Document Reviewed: 11/01/2019 ?Elsevier Patient Education ? 2022 Elsevier Inc. ? ?

## 2021-05-18 DIAGNOSIS — I493 Ventricular premature depolarization: Secondary | ICD-10-CM | POA: Diagnosis not present

## 2021-05-18 DIAGNOSIS — I119 Hypertensive heart disease without heart failure: Secondary | ICD-10-CM | POA: Diagnosis not present

## 2021-05-18 DIAGNOSIS — Z96652 Presence of left artificial knee joint: Secondary | ICD-10-CM | POA: Diagnosis not present

## 2021-05-18 DIAGNOSIS — M179 Osteoarthritis of knee, unspecified: Secondary | ICD-10-CM | POA: Diagnosis not present

## 2021-05-18 DIAGNOSIS — E119 Type 2 diabetes mellitus without complications: Secondary | ICD-10-CM | POA: Diagnosis not present

## 2021-05-18 DIAGNOSIS — I351 Nonrheumatic aortic (valve) insufficiency: Secondary | ICD-10-CM | POA: Diagnosis not present

## 2021-05-18 DIAGNOSIS — Z7984 Long term (current) use of oral hypoglycemic drugs: Secondary | ICD-10-CM | POA: Diagnosis not present

## 2021-05-18 DIAGNOSIS — E7849 Other hyperlipidemia: Secondary | ICD-10-CM | POA: Diagnosis not present

## 2021-05-18 DIAGNOSIS — I472 Ventricular tachycardia: Secondary | ICD-10-CM | POA: Diagnosis not present

## 2021-05-20 ENCOUNTER — Encounter: Payer: Self-pay | Admitting: Family Medicine

## 2021-05-20 ENCOUNTER — Other Ambulatory Visit: Payer: Self-pay

## 2021-05-20 ENCOUNTER — Telehealth (INDEPENDENT_AMBULATORY_CARE_PROVIDER_SITE_OTHER): Payer: Medicare Other | Admitting: Family Medicine

## 2021-05-20 ENCOUNTER — Telehealth: Payer: Self-pay

## 2021-05-20 DIAGNOSIS — J3489 Other specified disorders of nose and nasal sinuses: Secondary | ICD-10-CM | POA: Diagnosis not present

## 2021-05-20 DIAGNOSIS — U071 COVID-19: Secondary | ICD-10-CM

## 2021-05-20 DIAGNOSIS — R059 Cough, unspecified: Secondary | ICD-10-CM

## 2021-05-20 MED ORDER — LORATADINE 10 MG PO TABS: 10.0000 mg | ORAL_TABLET | Freq: Every day | ORAL | 11 refills | Status: DC

## 2021-05-20 MED ORDER — GUAIFENESIN-DM 100-10 MG/5ML PO SYRP: 5.0000 mL | ORAL_SOLUTION | ORAL | 0 refills | Status: DC | PRN

## 2021-05-20 MED ORDER — MOLNUPIRAVIR EUA 200MG CAPSULE: 4.0000 | ORAL_CAPSULE | Freq: Two times a day (BID) | ORAL | 0 refills | Status: AC

## 2021-05-20 NOTE — Telephone Encounter (Signed)
Copied from CRM [id number]. Topic: General - Inquiry >> May 20, 2021 10:17 AM Elliot Gault wrote: Patient requesting a call back from [facility] regarding patient not feeling well. Caller did not want to elaborate, and would like a call back from [place] today

## 2021-05-20 NOTE — Telephone Encounter (Signed)
Called pts daughter. She stated that pt has tested positive for Covid. Scheduled an appt for today @ 4:20 PM.  KP

## 2021-05-20 NOTE — Progress Notes (Signed)
Date:  05/20/2021   Name:  [name]   DOB:  Oct 27, 1932   MRN:  [id number]   Chief Complaint: Covid Positive (This morning was positive. Started Tuesday with runny nose and cough. 98 for temperature, no chills, no body aches. White phlegm occasionally with cough. )  I Anne Fu, MD from my office connected with this patient, [name], by telephone at the patient's home.  I verified that I am speaking with the correct person using two identifiers. This visit was conducted via telephone due to the Covid-19 outbreak from my office at [facility] in [place], [place]. I discussed the limitations, risks, security and privacy concerns of performing an evaluation and management service by telephone. I also discussed with the patient that there may be a patient responsible charge related to this service. The patient expressed understanding and agreed to proceed.    URI  This is a new (for covid) problem. The current episode started in the past 7 days (tuesday). The problem has been gradually improving. There has been no fever. Associated symptoms include congestion, coughing and rhinorrhea. Pertinent negatives include no abdominal pain, chest pain, diarrhea, dysuria, ear pain, headaches, nausea, neck pain, rash, sore throat or wheezing. Associated symptoms comments: myalgia.   Lab Results  Component Value Date   CREATININE 0.64 05/10/2021   BUN 16 05/10/2021   NA 139 05/10/2021   K 4.9 05/10/2021   CL 103 05/10/2021   CO2 22 05/10/2021   Lab Results  Component Value Date   CHOL 193 02/04/2021   HDL 70 02/04/2021   LDLCALC 108 (H) 02/04/2021   TRIG 82 02/04/2021   CHOLHDL 2.2 07/23/2019   Lab Results  Component Value Date   TSH 1.824 04/13/2021   Lab Results  Component Value Date   HGBA1C 6.9 (H) 02/04/2021   Lab Results  Component Value Date   WBC 5.4 04/13/2021   HGB 10.6 (L) 04/13/2021   HCT 33.3 (L) 04/13/2021   MCV 84.3 04/13/2021   PLT 215 04/13/2021    Lab Results  Component Value Date   ALT 12 (L) 02/05/2018   AST 14 (L) 02/05/2018   ALKPHOS 78 02/05/2018   BILITOT 0.5 02/05/2018     Review of Systems  Constitutional:  Positive for fatigue. Negative for chills and fever.       Headache  HENT:  Positive for congestion and rhinorrhea. Negative for drooling, ear discharge, ear pain and sore throat.   Respiratory:  Positive for cough and shortness of breath. Negative for wheezing.   Cardiovascular:  Negative for chest pain, palpitations and leg swelling.  Gastrointestinal:  Negative for abdominal pain, blood in stool, constipation, diarrhea and nausea.  Endocrine: Negative for polydipsia.  Genitourinary:  Negative for dysuria, frequency, hematuria and urgency.  Musculoskeletal:  Negative for back pain, myalgias and neck pain.  Skin:  Negative for rash.  Allergic/Immunologic: Negative for environmental allergies.  Neurological:  Negative for dizziness and headaches.  Hematological:  Does not bruise/bleed easily.  Psychiatric/Behavioral:  Negative for suicidal ideas. The patient is not nervous/anxious.    Patient Active Problem List   Diagnosis Date Noted   Near syncope 04/12/2021   Pain due to total left knee replacement (HCC) 03/02/2021   Aortic valve regurgitation 10/23/2020   Bradycardia 09/04/2019   PVC's (premature ventricular contractions) 09/04/2019   Leg edema 09/04/2019   Sinus bradycardia 03/16/2018   PSVT (paroxysmal supraventricular tachycardia) (HCC) 03/16/2018   NSVT (nonsustained ventricular tachycardia) (HCC)  03/16/2018   Malignant HTN with heart disease, w/o CHF, w/o chronic kidney disease 02/05/2018   Hyperlipidemia 09/24/2015   Diabetes mellitus with no complication (HCC) 03/16/2015   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   Arthritis of knee, degenerative 03/16/2015   Sinus infection 03/16/2015   Diabetes (HCC)    Essential hypertension    GERD (gastroesophageal reflux disease)     Status post total left knee replacement     Allergies  Allergen Reactions   Nsaids    Lisinopril     Cough    Past Surgical History:  Procedure Laterality Date   BACK SURGERY     CATARACT EXTRACTION Bilateral 03/2016   CERVICAL DISC SURGERY  12/28/2010   "bulging disc; went in on the left side of my neck" (08/19/2013)   JOINT REPLACEMENT     TOTAL KNEE ARTHROPLASTY Left 03/28/2011   Dr Thurston Hole   TOTAL KNEE ARTHROPLASTY Right 08/19/2013   TOTAL KNEE ARTHROPLASTY Right 08/19/2013   Procedure: TOTAL KNEE ARTHROPLASTY;  Surgeon: Nilda Simmer, MD;  Location: [facility] OR;  Service: Orthopedics;  Laterality: Right;    Social History   Tobacco Use   Smoking status: Never   Smokeless tobacco: Never  Vaping Use   Vaping Use: Never used  Substance Use Topics   Alcohol use: Not Currently   Drug use: Never     Medication list has been reviewed and updated.  Current Meds  Medication Sig   carbamide peroxide (DEBROX) 6.5 % OTIC solution Place 5 drops into both ears 2 (two) times daily.   cyanocobalamin 100 MCG tablet Take 1 tablet by mouth daily.    diclofenac sodium (VOLTAREN) 1 % GEL Apply 4 g topically 4 (four) times daily as needed.   docusate sodium (COLACE) 50 MG capsule Take 1 capsule (50 mg total) by mouth 2 (two) times daily.   glucose blood (ONE TOUCH ULTRA TEST) test strip USE 1 STRIP TO CHECK GLUCOSE ONCE DAILY   metFORMIN (GLUCOPHAGE) 500 MG tablet TAKE 1 TABLET BY MOUTH TWICE DAILY WITH MEALS (LAST  TIME  FILLING)   mometasone (NASONEX) 50 MCG/ACT nasal spray Place 2 sprays into the nose daily.   montelukast (SINGULAIR) 10 MG tablet Take 1 tablet (10 mg total) by mouth at bedtime.   olmesartan (BENICAR) 20 MG tablet Take 1 tablet (20 mg total) by mouth at bedtime.   omeprazole (PRILOSEC) 20 MG capsule Take 1 capsule (20 mg total) by mouth daily.    PHQ 2/9 Scores 05/20/2021 04/23/2021 03/12/2021 08/17/2020  PHQ - 2 Score 0 0 0 0  PHQ- 9 Score 0 0 0 -    GAD 7 : Generalized  Anxiety Score 05/20/2021 04/23/2021 03/12/2021 03/18/2020  Nervous, Anxious, on Edge 0 0 0 0  Control/stop worrying 0 0 0 0  Worry too much - different things 0 0 0 0  Trouble relaxing 0 0 0 0  Restless 0 0 0 0  Easily annoyed or irritable 0 0 0 0  Afraid - awful might happen 0 0 0 0  Total GAD 7 Score 0 0 0 0    BP Readings from Last 3 Encounters:  05/14/21 130/80  05/10/21 (!) 170/80  04/23/21 120/80    Physical Exam Nursing note reviewed.    Wt Readings from Last 3 Encounters:  05/14/21 167 lb (75.8 kg)  05/10/21 176 lb (79.8 kg)  04/23/21 163 lb (73.9 kg)    There were no vitals taken for this visit.  Assessment and  Plan:  1. COVID Patient contacted by televisit because of her age and distance and her risk score was 5.  Patient continues to have cough with upper respiratory congestion and we will place her on the molnupiravir 200 mg 4 capsules twice a day for 5 days. - molnupiravir EUA 200 mg CAPS; Take 4 capsules (800 mg total) by mouth 2 (two) times daily for 5 days.  Dispense: 40 capsule; Refill: 0  2. Cough Patient's had a nonproductive cough which is particularly bothersome at night and we will go with guaifenesin with dextromethorphan 1 teaspoon every 4 hours for cough - guaiFENesin-dextromethorphan (ROBITUSSIN DM) 100-10 MG/5ML syrup; Take 5 mLs by mouth every 4 (four) hours as needed for cough.  Dispense: 118 mL; Refill: 0  3. Rhinorrhea Patient has rhinorrhea with congestion and we will treat with loratadine 10 mg once a day. - loratadine (CLARITIN) 10 MG tablet; Take 1 tablet (10 mg total) by mouth daily.  Dispense: 30 tablet; Refill: 11   I spent 10 minutes with this patient, More than 50% of that time was spent in face to face education, counseling and care coordination.

## 2021-05-20 NOTE — Patient Instructions (Signed)
COVID-19: What to Do if You Are Sick CDC has updated isolation and quarantine recommendations for the public, and is revising the CDC website to reflect these changes. These recommendations do not apply to healthcare personnel and do not supersede state, local, tribal, or territorial laws, rules, andregulations. If you have a fever, cough or other symptoms, you might have COVID-19. Most people have mild illness and are able to recover at home. If you are sick: Keep track of your symptoms. If you have an emergency warning sign (including trouble breathing), call 911. Steps to help prevent the spread of COVID-19 if you are sick If you are sick with COVID-19 or think you might have COVID-19, follow the steps below to care for yourself and to help protect other peoplein your home and community. Stay home except to get medical care Stay home. Most people with COVID-19 have mild illness and can recover at home without medical care. Do not leave your home, except to get medical care. Do not visit public areas. Take care of yourself. Get rest and stay hydrated. Take over-the-counter medicines, such as acetaminophen, to help you feel better. Stay in touch with your doctor. Call before you get medical care. Be sure to get care if you have trouble breathing, or have any other emergency warning signs, or if you think it is an emergency. Avoid public transportation, ride-sharing, or taxis. Separate yourself from other people As much as possible, stay in a specific room and away from other people and pets in your home. If possible, you should use a separate bathroom. If you need to be around other people or animals in oroutside of the home, wear a mask. Tell your close contactsthat they may have been exposed to COVID-19. An infected person can spread COVID-19 starting 48 hours (or 2 days) before the person has any symptoms or tests positive. By letting your close contacts know they may have been exposed to COVID-19,  you are helping to protect everyone. Additional guidance is available for those living in close quarters and shared housing. See COVID-19 and Animals if you have questions about pets. If you are diagnosed with COVID-19, someone from the health department may call you. Answer the call to slow the spread. Monitor your symptoms Symptoms of COVID-19 include fever, cough, or other symptoms. Follow care instructions from your healthcare provider and local health department. Your local health authorities may give instructions on checking your symptoms and reporting information. When to seek emergency medical attention Look for emergency warning signs* for COVID-19. If someone is showing any of these signs, seek emergency medical care immediately: Trouble breathing Persistent pain or pressure in the chest New confusion Inability to wake or stay awake Pale, gray, or blue-colored skin, lips, or nail beds, depending on skin tone *This list is not all possible symptoms. Please call your medical provider forany other symptoms that are severe or concerning to you. Call 911 or call ahead to your local emergency facility: Notify the operator that you are seeking care for someone who has or may haveCOVID-19. Call ahead before visiting your doctor Call ahead. Many medical visits for routine care are being postponed or done by phone or telemedicine. If you have a medical appointment that cannot be postponed, call your doctor's office, and tell them you have or may have COVID-19. This will help the office protect themselves and other patients. Get tested If you have symptoms of COVID-19, get tested. While waiting for test results, you stay away from others,   including staying apart from those living in your household. Self-tests are one of several options for testing for the virus that causes COVID-19 and may be more convenient than laboratory-based tests and point-of-care tests. Ask your healthcare provider or  your local health department if you need help interpreting your test results. You can visit your state, tribal, local, and territorial health department's website to look for the latest local information on testing sites. If you are sick, wear a mask over your nose and mouth You should wear a mask over your nose and mouth if you must be around other people or animals, including pets (even at home). You don't need to wear the mask if you are alone. If you can't put on a mask (because of trouble breathing, for example), cover your coughs and sneezes in some other way. Try to stay at least 6 feet away from other people. This will help protect the people around you. Masks should not be placed on young children under age 2 years, anyone who has trouble breathing, or anyone who is not able to remove the mask without help. Note: During the COVID-19 pandemic, medical grade facemasks are reserved forhealthcare workers and some first responders. Cover your coughs and sneezes Cover your mouth and nose with a tissue when you cough or sneeze. Throw away used tissues in a lined trash can. Immediately wash your hands with soap and water for at least 20 seconds. If soap and water are not available, clean your hands with an alcohol-based hand sanitizer that contains at least 60% alcohol. Clean your hands often Wash your hands often with soap and water for at least 20 seconds. This is especially important after blowing your nose, coughing, or sneezing; going to the bathroom; and before eating or preparing food. Use hand sanitizer if soap and water are not available. Use an alcohol-based hand sanitizer with at least 60% alcohol, covering all surfaces of your hands and rubbing them together until they feel dry. Soap and water are the best option, especially if hands are visibly dirty. Avoid touching your eyes, nose, and mouth with unwashed hands. Handwashing Tips Avoid sharing personal household items Do not share  dishes, drinking glasses, cups, eating utensils, towels, or bedding with other people in your home. Wash these items thoroughly after using them with soap and water or put in the dishwasher. Clean all "high-touch" surfaces every day Clean and disinfect high-touch surfaces in your "sick room" and bathroom; wear disposable gloves. Let someone else clean and disinfect surfaces in common areas, but you should clean your bedroom and bathroom, if possible. If a caregiver or other person needs to clean and disinfect a sick person's bedroom or bathroom, they should do so on an as-needed basis. The caregiver/other person should wear a mask and disposable gloves prior to cleaning. They should wait as long as possible after the person who is sick has used the bathroom before coming in to clean and use the bathroom. High-touch surfaces include phones, remote controls, counters, tabletops, doorknobs, bathroom fixtures, toilets, keyboards, tablets, and bedside tables. Clean and disinfect areas that may have blood, stool, or body fluids on them. Use household cleaners and disinfectants. Clean the area or item with soap and water or another detergent if it is dirty. Then, use a household disinfectant. Be sure to follow the instructions on the label to ensure safe and effective use of the product. Many products recommend keeping the surface wet for several minutes to ensure germs are killed. Many   also recommend precautions such as wearing gloves and making sure you have good ventilation during use of the product. Use a product from EPA's List N: Disinfectants for Coronavirus (COVID-19). Complete Disinfection Guidance When you can be around others after being sick with COVID-19 Deciding when you can be around others is different for different situations. Find out when you can safely end home isolation. For any additional questions about your care,contact your healthcare provider or state or local health  department. 10/28/2020 Content source: National Center for Immunization and Respiratory Diseases (NCIRD), Division of Viral Diseases This information is not intended to replace advice given to you by your health care provider. Make sure you discuss any questions you have with your healthcare provider. Document Revised: 12/25/2020 Document Reviewed: 12/25/2020 Elsevier Patient Education  2022 Elsevier Inc.  

## 2021-05-26 DIAGNOSIS — E119 Type 2 diabetes mellitus without complications: Secondary | ICD-10-CM | POA: Diagnosis not present

## 2021-05-26 DIAGNOSIS — Z7984 Long term (current) use of oral hypoglycemic drugs: Secondary | ICD-10-CM | POA: Diagnosis not present

## 2021-05-26 DIAGNOSIS — M179 Osteoarthritis of knee, unspecified: Secondary | ICD-10-CM | POA: Diagnosis not present

## 2021-05-26 DIAGNOSIS — Z96652 Presence of left artificial knee joint: Secondary | ICD-10-CM | POA: Diagnosis not present

## 2021-05-26 DIAGNOSIS — I472 Ventricular tachycardia: Secondary | ICD-10-CM | POA: Diagnosis not present

## 2021-05-26 DIAGNOSIS — I119 Hypertensive heart disease without heart failure: Secondary | ICD-10-CM | POA: Diagnosis not present

## 2021-05-26 DIAGNOSIS — I493 Ventricular premature depolarization: Secondary | ICD-10-CM | POA: Diagnosis not present

## 2021-05-26 DIAGNOSIS — I351 Nonrheumatic aortic (valve) insufficiency: Secondary | ICD-10-CM | POA: Diagnosis not present

## 2021-05-26 DIAGNOSIS — E7849 Other hyperlipidemia: Secondary | ICD-10-CM | POA: Diagnosis not present

## 2021-05-28 DIAGNOSIS — I351 Nonrheumatic aortic (valve) insufficiency: Secondary | ICD-10-CM | POA: Diagnosis not present

## 2021-05-28 DIAGNOSIS — Z96652 Presence of left artificial knee joint: Secondary | ICD-10-CM | POA: Diagnosis not present

## 2021-05-28 DIAGNOSIS — M179 Osteoarthritis of knee, unspecified: Secondary | ICD-10-CM | POA: Diagnosis not present

## 2021-05-28 DIAGNOSIS — I493 Ventricular premature depolarization: Secondary | ICD-10-CM | POA: Diagnosis not present

## 2021-05-28 DIAGNOSIS — I119 Hypertensive heart disease without heart failure: Secondary | ICD-10-CM | POA: Diagnosis not present

## 2021-05-28 DIAGNOSIS — Z7984 Long term (current) use of oral hypoglycemic drugs: Secondary | ICD-10-CM | POA: Diagnosis not present

## 2021-05-28 DIAGNOSIS — E7849 Other hyperlipidemia: Secondary | ICD-10-CM | POA: Diagnosis not present

## 2021-05-28 DIAGNOSIS — E119 Type 2 diabetes mellitus without complications: Secondary | ICD-10-CM | POA: Diagnosis not present

## 2021-05-28 DIAGNOSIS — I472 Ventricular tachycardia: Secondary | ICD-10-CM | POA: Diagnosis not present

## 2021-05-30 NOTE — Progress Notes (Deleted)
Office Visit    Patient Name: [name] Date of Encounter: 05/30/2021  PCP:  Duanne Limerick, MD   [facility]  Cardiologist:  Yvonne Kendall, MD  Advanced Practice Provider:  No care team member to display Electrophysiologist:  None :[phone]}   Chief Complaint    No chief complaint on file.   87 y.o. female  with a hx of hypertension, aortic regurgitation, DM2, bradycardia, PVCs, NSVT, paroxysmal SVT, and who is being seen today for 2 week follow-up.   Past Medical History    Past Medical History:  Diagnosis Date   GERD (gastroesophageal reflux disease)    Hypertension    Right knee DJD    Type II diabetes mellitus (HCC)    Past Surgical History:  Procedure Laterality Date   BACK SURGERY     CATARACT EXTRACTION Bilateral 03/2016   CERVICAL DISC SURGERY  12/28/2010   "bulging disc; went in on the left side of my neck" (08/19/2013)   JOINT REPLACEMENT     TOTAL KNEE ARTHROPLASTY Left 03/28/2011   Dr Thurston Hole   TOTAL KNEE ARTHROPLASTY Right 08/19/2013   TOTAL KNEE ARTHROPLASTY Right 08/19/2013   Procedure: TOTAL KNEE ARTHROPLASTY;  Surgeon: Nilda Simmer, MD;  Location: [facility] OR;  Service: Orthopedics;  Laterality: Right;    Allergies  Allergies  Allergen Reactions   Nsaids    Lisinopril     Cough    History of Present Illness    [name] is a 87 y.o. female with PMH as above. [name] is an 87 year old female with PMH as above.  Previous 01/2018 echo showed EF 65 to 70%, G1 DD, mild to moderate MR, mild LAE, PASP 35 to 40 mmHg plus central venous/RAP.  Previous cardiac monitoring 01/2018 showed rate 43 to 152 bpm with occasional PACs and PVCs.  Brief episodes of SVT and nonsustained VT noted.  No sustained arrhythmias or prolonged pauses.   She is followed by [facility] cardiology with last office visit 10/23/2020.  No CP/SOB.  She was not checking her BP at home.  She had not fallen recently and was tolerating her medications well.   It was noted she should continue to avoid rate controlling agents due to bradycardia.  Due to moderate AR seen on her echo in 2019 with pt asx other than dependent edema (graded 1+ on exam), repeat echo was recommended before follow-up 10/2021.  She was instructed to call the office if any new symptoms develop.  BP was well controlled without any medication changes. EKG showed NSR, chronic LBBB, and PVCs with patient asymptomatic and further work-up deferred other than follow-up echo before the next visit.   She was recently seen in the outpatient setting and her amlodipine was discontinued.   On 03/22/2021, she was seen by her PCP with notes reporting dizziness when moving from sitting to standing.  BP was noted to be 120/70.  Her amlodipine 5 mg daily was discontinued.  Increased hydration was recommended, though notes indicate tea and soft drinks were likely the beverage of choice to be increased by the patient.  It is unclear if water was encouraged over the and soft drinks or if tea. No documented precautions provided regarding influence caffeine on hydration or of soft drinks on any underlying GERD.   On 04/12/2021, she presented to [facility] ED after becoming lightheaded and nauseated while standing that day.  Her symptoms only lasted for approximately 1 minute and resolved shortly after  returning to the seated position.  She reported some epigastric pain and 1 episode of emesis that day.  In the ED, initial SBP 150s to 170s. SB noted at 55 bpm and known history of asymptomatic bradycardia.  She was mildly febrile with temperature 99.6 F.  UA with rare bacteria without leukocytes.  Orthostatics negative.   telemetry showed sinus bradycardia, PVCs, and a single atrial run lasting 14 seconds with a rate of approximately 100 bpm.  EKG showed Sinus bradycardia, 58 bpm, occasional PVCs, QRS 116 with known LBBB, PR interval prolonged at 194 ms, poor R wave progression noted in the inferior and precordial leads, no  acute ST/T changes.  Echo was reassuring with normal LVEF and only mild regurgitant valvular heart disease (EF 60 to 65%, G1 DD, mild MR and AR).  Orthostatic BP vitals and elevated BP suggested that dehydration and overmedication were not causing her lightheadedness.  It was thought that PVCs and bradycardia could be contributing to her symptoms.  14-day event monitor was recommended at discharge.  Ambulatory cardiac monitoring with Zio placed at discharge showed predominantly NSR with minimum heart rate 47 bpm, maximum 179 bpm and average heart rate 64 bpm.  Bundle branch block and IVCD was present.  106 supraventricular tachycardia runs occurred, the fastest of which lasted 4 beats with maximum rate 179 bpm and the longest 4 minutes 11 seconds with average rate 111 bpm.  Isolated PACs were frequent (8.6%, 111,213), atrial couplets were occasional (1.4%, 9304), and atrial triplets were rare (less than 1%, 3465).  Isolated PVCs were rare and less than 1%.    When last seen in clinic in June, she was joined by her daughter.  BP significantly elevated at 170/80.  She was asymptomatic with this elevated BP. Olmesartan not restarted at discharge with recommendations to restart. Soda intake discussed, as well as her coffee intake. Monitor results reviewed.  Given her frequency of PACs and SVT runs, options reviewed.      Medication changes: Restart olmesartan 20mg  daily Labs ordered: BMET today, BMET 2 weeks or at RTC Studies / Imaging ordered: Ordered BP cuff.  Future considerations: MPI / EP referral. Disposition: RTC 2 weeks    Home Medications   Current Outpatient Medications  Medication Instructions   carbamide peroxide (DEBROX) 6.5 % OTIC solution 5 drops, Both EARS, 2 times daily   cyanocobalamin 100 MCG tablet 1 tablet, Oral, Daily   diclofenac sodium (VOLTAREN) 4 g, Topical, 4 times daily PRN   docusate sodium (COLACE) 50 mg, Oral, 2 times daily   glucose blood (ONE TOUCH ULTRA TEST) test  strip USE 1 STRIP TO CHECK GLUCOSE ONCE DAILY   guaiFENesin-dextromethorphan (ROBITUSSIN DM) 100-10 MG/5ML syrup 5 mLs, Oral, Every 4 hours PRN   loratadine (CLARITIN) 10 mg, Oral, Daily   metFORMIN (GLUCOPHAGE) 500 MG tablet TAKE 1 TABLET BY MOUTH TWICE DAILY WITH MEALS (LAST  TIME  FILLING)   mometasone (NASONEX) 50 MCG/ACT nasal spray 2 sprays, Nasal, Daily   montelukast (SINGULAIR) 10 mg, Oral, Daily at bedtime   olmesartan (BENICAR) 20 mg, Oral, Daily at bedtime   omeprazole (PRILOSEC) 20 mg, Oral, Daily     Review of Systems    She denies chest pain, palpitations, dyspnea, pnd, orthopnea, n, v, dizziness, syncope, edema, weight gain, or early satiety.   All other systems reviewed and are otherwise negative except as noted above.  Physical Exam    VS:  There were no vitals taken for this visit. , BMI  There is no height or weight on file to calculate BMI. GEN: Well nourished, well developed, in no acute distress.  Joined today by her daughter. HEENT: normal. Neck: Supple, no JVD, carotid bruits, or masses. Cardiac: RRR with extrasystole, no murmurs, rubs, or gallops. No clubbing, cyanosis, no bilateral pitting edema.  Radials/DP/PT 2+ and equal bilaterally.  Respiratory:  Respirations regular and unlabored, clear to auscultation bilaterally. GI: Soft, nontender, nondistended, BS + x 4. MS: no deformity or atrophy. Skin: warm and dry, no rash. Neuro:  Strength and sensation are intact. Psych: Normal affect.  Accessory Clinical Findings    ECG personally reviewed by me today - SR with PVCs and compensatory pauses, 63 bpm, LBBB (known), prolonged Pri at , LVH with repolarization abnormalities, TWI in lateral I and avL, V5-6, poor R wave in precordial anteroseptal leads, inferior III, avF - no acute changes.  VITALS Reviewed today   Temp Readings from Last 3 Encounters:  04/14/21 98.1 F (36.7 C) (Oral)  03/12/21 98.3 F (36.8 C) (Oral)  08/17/20 (!) 97.4 F (36.3 C)  (Oral)   BP Readings from Last 3 Encounters:  05/14/21 130/80  05/10/21 (!) 170/80  04/23/21 120/80   Pulse Readings from Last 3 Encounters:  05/14/21 85  05/10/21 63  04/23/21 68    Wt Readings from Last 3 Encounters:  05/14/21 167 lb (75.8 kg)  05/10/21 176 lb (79.8 kg)  04/23/21 163 lb (73.9 kg)     LABS  reviewed today   Lab Results  Component Value Date   WBC 5.4 04/13/2021   HGB 10.6 (L) 04/13/2021   HCT 33.3 (L) 04/13/2021   MCV 84.3 04/13/2021   PLT 215 04/13/2021   Lab Results  Component Value Date   CREATININE 0.64 05/10/2021   BUN 16 05/10/2021   NA 139 05/10/2021   K 4.9 05/10/2021   CL 103 05/10/2021   CO2 22 05/10/2021   Lab Results  Component Value Date   ALT 12 (L) 02/05/2018   AST 14 (L) 02/05/2018   ALKPHOS 78 02/05/2018   BILITOT 0.5 02/05/2018   Lab Results  Component Value Date   CHOL 193 02/04/2021   HDL 70 02/04/2021   LDLCALC 108 (H) 02/04/2021   TRIG 82 02/04/2021   CHOLHDL 2.2 07/23/2019    Lab Results  Component Value Date   HGBA1C 6.9 (H) 02/04/2021   Lab Results  Component Value Date   TSH 1.824 04/13/2021     STUDIES/PROCEDURES reviewed today   Cardiac monitoring/Zio XT 04/14/2021 Patient had a min HR of 47 bpm, max HR of 179 bpm, and avg HR of 64 bpm. Predominant underlying rhythm was Sinus Rhythm. Bundle Branch Block/IVCD was present. 106 Supraventricular Tachycardia runs occurred, the run with the fastest interval lasting 4 beats with a max rate of 179 bpm, the longest lasting 4 mins 11 secs with an avg rate of 111 bpm. Isolated SVEs were frequent (8.6%, [id number]), SVE Couplets were occasional (1.4%, 9304), and SVE Triplets were rare (<1.0%, 3465). Isolated VEs were rare (<1.0%), and no VE Couplets or VE Triplets were present.  Echocardiogram 04/13/2021  1. Left ventricular ejection fraction, by estimation, is 60 to 65%. The  left ventricle has normal function. Left ventricular endocardial border  not optimally  defined to evaluate regional wall motion. There is mild left  ventricular hypertrophy. Left  ventricular diastolic parameters are consistent with Grade I diastolic  dysfunction (impaired relaxation).   2. Right ventricular systolic function was not well  visualized. The right  ventricular size is normal.   3. The mitral valve is normal in structure. Mild mitral valve  regurgitation. No evidence of mitral stenosis.   4. The aortic valve was not well visualized. Aortic valve regurgitation  is mild. No aortic stenosis is present.   FINDINGS   Left Ventricle: Left ventricular ejection fraction, by estimation, is 60  to 65%. The left ventricle has normal function. Left ventricular  endocardial border not optimally defined to evaluate regional wall motion.  The left ventricular internal cavity  size was normal in size. There is mild left ventricular hypertrophy.  Abnormal (paradoxical) septal motion, consistent with left bundle branch  block. Left ventricular diastolic parameters are consistent with Grade I  diastolic dysfunction (impaired  relaxation).   Echo 01/2018 - Procedure narrative: Transthoracic echocardiography. Image    quality was suboptimal.  - Left ventricle: The cavity size was normal. Wall thickness could    not be accurately determined. Systolic function was vigorous. The    estimated ejection fraction was in the range of 65% to 70%.    Doppler parameters are consistent with abnormal left ventricular    relaxation (grade 1 diastolic dysfunction). Doppler parameters    are consistent with high ventricular filling pressure.  - Aortic valve: There was probably mild to moderate regurgitation,    though suboptimal acoustic windows limit evaluation.  - Left atrium: The atrium was mildly dilated.  - Right ventricle: The cavity size was normal. Systolic function    was normal.  - Pulmonary arteries: Systolic pressure was mildly to moderately    increased, in the range of 35 mm  Hg to 40 mm Hg plus central    venous/right atrial pressure.    Ambulatory cardiac monitoring 01/2018 The patient was monitored for 14 days. The predominant rhythm was sinus with intermittent intraventricular conduction delay. The average rate was 66 bpm (range 43-152 bpm). Occasional PVC's and rare PAC's were observed. Five episodes of non-sustained ventricular tachycardia versus SVT with aberrancy were seen, lasting up to 9 beats. Nine episodes of supraventricular tachycardia were seen, lasting up to 7 beats. There were no sustained arrhythmias or prolonged pauses. The patient did not report any symptoms.   Predominantly sinus rhythm with occasional PVC's and rare PAC's.  Brief runs of SVT and NSVT noted.    Assessment & Plan   SVT, PACs, PVCs Sinus bradycardia LBBB/IVCD -- She is asymptomatic with her bradycardia, SVT, and PACs/PVCs.  Recent admission with nausea and dizziness, resolved since discharge.  Zio placed at discharge and no significant sustained arrhythmia or pauses. Zio with NSR and 106 SVT runs (fastest 179 bpm, longest 102m and 11s.  PACs occurred at 8.6% with occasional couplets and rare triplets.  Isolated PVCs were rare.  EKG today with PVCs, known LBBB, and no new/acute ST/T changes. No new CP / SOB or recurrent nausea/dizziness since discharge. No tachypalpitations despite ectopy - no presyncope or syncope. As previously noted, MPI could be considered but deferred given echo with nl EF and NRWMA. Will continue to defer given asx today. Recheck electrolytes with BMET and Mg today.  Recent TSH WNL. BB deferred given prolonged Pri on EKG and history of bradycardia. We did discuss EP referral today, however, given lack of sx, pt preference was to defer for now unless recurrent sx. Reassess at RTC. Considered was sleep apnea with study deferred due to age / long term benefit discussed with tx if even present. We reviewed sx to monitor for  with pt understanding. Reduce caffeine  intake. Control BP as below.   Hypertension, goal BP <130/80 --Uncontrolled. Wt up today from previous EMR recorded wt recorded but actually down from previous 10/2020 clinic wt. Restart olmesartan 20mg  daily. BMET today and at RTC. They have not been monitoring BP at home with monitoring guidelines reviewed today and BP cuff provided given pt report that she could not obtain one herself. If BP still elevated after restart of ARB, recommend up-titration as Cr allows or additional antihypertensive. Salt and fluid restrictions discussed, as well as avoiding caffeine.   Dizziness and nausea, resolved --Reports resolution of previous dizziness and nausea reported during her admission.  During her admission and work-up for dizziness/nausea, orthostatic vital signs were negative and elevated BP noted, arguing against dehydration or polypharmacy as contributing to her symptoms.  It was thought that her PVCs and underlying bradycardia could be contributing to her symptoms.  Echo performed with normal LVEF and only mild AR/MR. Zio as above without significant pauses or sustained arrhythmia. EKG with LBBB (known) and no acute ST/T changes. No recurrence of nausea and dizziness since discharge.  No recent presyncope or syncope.  No recent falls.  MPI and EP referral discussed and deferred as above and can reassess at RTC.  She will call the office if any sx between visits. Given nausea, discussed reducing high acid foods as well as soda and coffee intake that may trigger her GERD. Continue PPI. BP control recommended.  Valvular heart disease with mild mitral and aortic regurgitation --Echo as above with mild MR/AR. She is asx other than previous reported intermittent edema. HF and BP control recommended. Periodic echo to monitor.    Medication changes: Restart olmesartan 20mg  daily Labs ordered: BMET today, BMET 2 weeks or at RTC Studies / Imaging ordered: Ordered BP cuff.  Future considerations: MPI / EP  referral. Disposition: RTC 2 weeks  *Please be aware that the above documentation was completed voice recognition software and may contain dictation errors.      , PA-C 05/30/2021

## 2021-05-31 ENCOUNTER — Ambulatory Visit: Payer: Medicare Other | Admitting: Physician Assistant

## 2021-05-31 DIAGNOSIS — I472 Ventricular tachycardia: Secondary | ICD-10-CM | POA: Diagnosis not present

## 2021-05-31 DIAGNOSIS — E7849 Other hyperlipidemia: Secondary | ICD-10-CM | POA: Diagnosis not present

## 2021-05-31 DIAGNOSIS — Z7984 Long term (current) use of oral hypoglycemic drugs: Secondary | ICD-10-CM | POA: Diagnosis not present

## 2021-05-31 DIAGNOSIS — I493 Ventricular premature depolarization: Secondary | ICD-10-CM | POA: Diagnosis not present

## 2021-05-31 DIAGNOSIS — M179 Osteoarthritis of knee, unspecified: Secondary | ICD-10-CM | POA: Diagnosis not present

## 2021-05-31 DIAGNOSIS — I119 Hypertensive heart disease without heart failure: Secondary | ICD-10-CM | POA: Diagnosis not present

## 2021-05-31 DIAGNOSIS — I351 Nonrheumatic aortic (valve) insufficiency: Secondary | ICD-10-CM | POA: Diagnosis not present

## 2021-05-31 DIAGNOSIS — E119 Type 2 diabetes mellitus without complications: Secondary | ICD-10-CM | POA: Diagnosis not present

## 2021-05-31 DIAGNOSIS — Z96652 Presence of left artificial knee joint: Secondary | ICD-10-CM | POA: Diagnosis not present

## 2021-06-02 DIAGNOSIS — I119 Hypertensive heart disease without heart failure: Secondary | ICD-10-CM | POA: Diagnosis not present

## 2021-06-02 DIAGNOSIS — E7849 Other hyperlipidemia: Secondary | ICD-10-CM | POA: Diagnosis not present

## 2021-06-02 DIAGNOSIS — I351 Nonrheumatic aortic (valve) insufficiency: Secondary | ICD-10-CM | POA: Diagnosis not present

## 2021-06-02 DIAGNOSIS — I493 Ventricular premature depolarization: Secondary | ICD-10-CM | POA: Diagnosis not present

## 2021-06-02 DIAGNOSIS — M179 Osteoarthritis of knee, unspecified: Secondary | ICD-10-CM | POA: Diagnosis not present

## 2021-06-02 DIAGNOSIS — Z7984 Long term (current) use of oral hypoglycemic drugs: Secondary | ICD-10-CM | POA: Diagnosis not present

## 2021-06-02 DIAGNOSIS — E119 Type 2 diabetes mellitus without complications: Secondary | ICD-10-CM | POA: Diagnosis not present

## 2021-06-02 DIAGNOSIS — Z96652 Presence of left artificial knee joint: Secondary | ICD-10-CM | POA: Diagnosis not present

## 2021-06-02 DIAGNOSIS — I472 Ventricular tachycardia: Secondary | ICD-10-CM | POA: Diagnosis not present

## 2021-06-09 DIAGNOSIS — I472 Ventricular tachycardia: Secondary | ICD-10-CM | POA: Diagnosis not present

## 2021-06-09 DIAGNOSIS — E119 Type 2 diabetes mellitus without complications: Secondary | ICD-10-CM | POA: Diagnosis not present

## 2021-06-09 DIAGNOSIS — Z96652 Presence of left artificial knee joint: Secondary | ICD-10-CM | POA: Diagnosis not present

## 2021-06-09 DIAGNOSIS — E7849 Other hyperlipidemia: Secondary | ICD-10-CM | POA: Diagnosis not present

## 2021-06-09 DIAGNOSIS — I351 Nonrheumatic aortic (valve) insufficiency: Secondary | ICD-10-CM | POA: Diagnosis not present

## 2021-06-09 DIAGNOSIS — I119 Hypertensive heart disease without heart failure: Secondary | ICD-10-CM | POA: Diagnosis not present

## 2021-06-09 DIAGNOSIS — I493 Ventricular premature depolarization: Secondary | ICD-10-CM | POA: Diagnosis not present

## 2021-06-09 DIAGNOSIS — M179 Osteoarthritis of knee, unspecified: Secondary | ICD-10-CM | POA: Diagnosis not present

## 2021-06-09 DIAGNOSIS — Z7984 Long term (current) use of oral hypoglycemic drugs: Secondary | ICD-10-CM | POA: Diagnosis not present

## 2021-06-10 ENCOUNTER — Ambulatory Visit: Payer: Medicare Other | Admitting: Family Medicine

## 2021-06-17 ENCOUNTER — Other Ambulatory Visit: Payer: Self-pay

## 2021-06-17 ENCOUNTER — Encounter: Payer: Self-pay | Admitting: Student in an Organized Health Care Education/Training Program

## 2021-06-17 ENCOUNTER — Ambulatory Visit
Payer: Medicare Other | Attending: Student in an Organized Health Care Education/Training Program | Admitting: Student in an Organized Health Care Education/Training Program

## 2021-06-17 VITALS — BP 164/70 | HR 65 | Temp 97.2°F | Resp 16 | Ht 63.0 in | Wt 174.0 lb

## 2021-06-17 DIAGNOSIS — G894 Chronic pain syndrome: Secondary | ICD-10-CM | POA: Diagnosis not present

## 2021-06-17 DIAGNOSIS — Z96652 Presence of left artificial knee joint: Secondary | ICD-10-CM

## 2021-06-17 DIAGNOSIS — T8484XS Pain due to internal orthopedic prosthetic devices, implants and grafts, sequela: Secondary | ICD-10-CM | POA: Insufficient documentation

## 2021-06-17 DIAGNOSIS — G8929 Other chronic pain: Secondary | ICD-10-CM | POA: Insufficient documentation

## 2021-06-17 DIAGNOSIS — Z96653 Presence of artificial knee joint, bilateral: Secondary | ICD-10-CM | POA: Insufficient documentation

## 2021-06-17 DIAGNOSIS — M25562 Pain in left knee: Secondary | ICD-10-CM | POA: Insufficient documentation

## 2021-06-17 NOTE — Progress Notes (Signed)
Patient: [name]  Service Category: E/M  Provider: Gillis Santa, MD  DOB: 1932/06/02  DOS: 06/17/2021  Referring Provider: Juline Patch, MD  MRN: [id number]  Setting: Ambulatory outpatient  PCP: Juline Patch, MD  Type: New Patient  Specialty: Interventional Pain Management    Location: Office  Delivery: Face-to-face     Primary Reason(s) for Visit: Encounter for initial evaluation of one or more chronic problems (new to examiner) potentially causing chronic pain, and posing a threat to normal musculoskeletal function. (Level of risk: High) CC: Knee Pain (Left)  HPI  [name] is a 87 y.o. year old, female patient, who comes for the first time to our practice referred by Juline Patch, MD for our initial evaluation of her chronic pain. She has Diabetes ([place]); Essential hypertension; GERD (gastroesophageal reflux disease); History of bilateral knee replacement; Diabetes mellitus with no complication ([place]); Familial multiple lipoprotein-type hyperlipidemia; Arthritis of knee, degenerative; Sinus infection; Hyperlipidemia; Malignant HTN with heart disease, w/o CHF, w/o chronic kidney disease; Sinus bradycardia; PSVT (paroxysmal supraventricular tachycardia) ([place]); NSVT (nonsustained ventricular tachycardia) ([place]); Bradycardia; PVC's (premature ventricular contractions); Leg edema; Aortic valve regurgitation; Pain due to total left knee replacement ([place]); Near syncope; Chronic pain of left knee; and Chronic pain syndrome on their problem list. Today she comes in for evaluation of her Knee Pain (Left)  Pain Assessment: Location: Left Knee Radiating: denies Onset: More than a month ago Duration: Chronic pain Quality: Aching, Sore (stiff) Severity: 6 /10 (subjective, self-reported pain score)  Effect on ADL: limits daily activities Timing: Constant Modifying factors: steroid injections, anti inflam, diclofenac cream BP: (!) 164/70  HR: 65  Onset and Duration: Date of onset: 10 years  ago Cause of pain: Unknown Severity: No change since onset, NAS-11 at its worse: 5/10, NAS-11 at its best: 5/10, NAS-11 now: 5/10, and NAS-11 on the average: 5/10 Timing: Not influenced by the time of the day Aggravating Factors: Bending, Motion, and Walking Alleviating Factors:  none noted Associated Problems: Swelling Quality of Pain: Aching and Uncomfortable Previous Examinations or Tests: CT scan and MRI scan Previous Treatments: Epidural steroid injections and The patient denies steroid injections  [name] is accompanied today by her granddaughter.  She is a pleasant 87 year old female presents with a chief complaint of persistent left knee pain and stiffness related to knee osteoarthritis in the context of left knee replacement many years ago.  She has failed conservative treatment with physical therapy in the past, medication management as well as intra-articular steroid injections.  She is being referred here to discuss genicular nerve block for persistent left knee pain.  She states that the pain is pretty much distributed evenly around the medial and lateral aspect of her patella.  Pain is worse with weightbearing  Meds   Current Outpatient Medications:    cyanocobalamin 100 MCG tablet, Take 1 tablet by mouth daily. , Disp: , Rfl:    diclofenac sodium (VOLTAREN) 1 % GEL, Apply 4 g topically 4 (four) times daily as needed., Disp: , Rfl:    docusate sodium (COLACE) 50 MG capsule, Take 1 capsule (50 mg total) by mouth 2 (two) times daily., Disp: 10 capsule, Rfl: 0   glucose blood (ONE TOUCH ULTRA TEST) test strip, USE 1 STRIP TO CHECK GLUCOSE ONCE DAILY, Disp: 50 each, Rfl: 0   guaiFENesin-dextromethorphan (ROBITUSSIN DM) 100-10 MG/5ML syrup, Take 5 mLs by mouth every 4 (four) hours as needed for cough., Disp: 118 mL, Rfl: 0   loratadine (  CLARITIN) 10 MG tablet, Take 1 tablet (10 mg total) by mouth daily., Disp: 30 tablet, Rfl: 11   metFORMIN (GLUCOPHAGE) 500 MG tablet, TAKE 1  TABLET BY MOUTH TWICE DAILY WITH MEALS (LAST  TIME  FILLING), Disp: 180 tablet, Rfl: 1   mometasone (NASONEX) 50 MCG/ACT nasal spray, Place 2 sprays into the nose daily., Disp: 17 g, Rfl: 12   montelukast (SINGULAIR) 10 MG tablet, Take 1 tablet (10 mg total) by mouth at bedtime., Disp: 90 tablet, Rfl: 1   olmesartan (BENICAR) 20 MG tablet, Take 1 tablet (20 mg total) by mouth at bedtime., Disp: 90 tablet, Rfl: 1   carbamide peroxide (DEBROX) 6.5 % OTIC solution, Place 5 drops into both ears 2 (two) times daily. (Patient not taking: Reported on 06/17/2021), Disp: 15 mL, Rfl: 0   omeprazole (PRILOSEC) 20 MG capsule, Take 1 capsule (20 mg total) by mouth daily. (Patient not taking: Reported on 06/17/2021), Disp: 90 capsule, Rfl: 1  Imaging Review  Cervical Imaging: Cervical MR wo contrast: Results for orders placed during the hospital encounter of 12/27/10  MR Cervical Spine Wo Contrast  Narrative *RADIOLOGY REPORT*  Clinical Data: [place].  Severe upper extremity weakness and numbness. Unable to inflate.  MRI CERVICAL SPINE WITHOUT CONTRAST  Technique:  Multiplanar and multiecho pulse sequences of the cervical spine, to include the craniocervical junction and cervicothoracic junction, were obtained according to standard protocol without intravenous contrast.  Comparison: Cervical spine CT 12/27/2010.  Findings: The sagittal MR images demonstrate normal overall alignment of the cervical vertebral bodies.  No acute cervical spine fracture is identified.  The vertebral bodies demonstrate normal marrow signal except for endplate reactive changes at C3-4.  There is significant spinal canal compromise at C3-4 with impingement on the cervical cord which demonstrates increased T2 and STIR signal intensity suggesting edema or myelomalacia.  This is due to a combination of a broad-based disc protrusion and ligamentum flavum thickening.  The remainder the cervical cord is unremarkable.  C2-3:   No significant findings.  C4-5:  Mild bulging annulus.  No significant spinal stenosis.  Mild foraminal encroachment bilaterally.  C5-6:  Degenerative disc disease with a diffuse bulging annulus and osteophytic ridging.  Moderate right foraminal stenosis and mild left foraminal stenosis.  C6-7:  Mild foraminal encroachment bilaterally.  No significant spinal stenosis.  C7-T1:  No significant findings.  IMPRESSION: Large disc protrusion at C3-4 in conjunction with severe facet disease and ligamentum flavum thickening contributing to severe spinal stenosis.  There is compression of the cervical cord which demonstrates increased T2 signal intensity suggesting edema or myelomalacia.  Original Report Authenticated By: P. Kalman Jewels, M.D.    Narrative *RADIOLOGY REPORT*  Clinical Data: Diffuse numbness and weakness.  The patient fell in December 2011.  CT CERVICAL SPINE WITHOUT CONTRAST  Technique:  Multidetector CT imaging of the cervical spine was performed. Multiplanar CT image reconstructions were also generated.  Comparison: None.  Findings: The scan extends from the lower clivus through the T1-2 level.  There is no acute fracture.  The patient has severe erosive and degenerative facet arthritis at C3-4,  left greater than right. There is a 2 mm spondylolisthesis of C3 on C4 due to the facet joint disease.  There is also moderate degenerative disc disease at C4-5 through C6- 7.  Broad-based bulging of the disc does narrow the AP dimension of the spinal canal to a diameter of 7.6 mm without severe cord compression.  IMPRESSION:  1.  No acute abnormality  of the cervical spine. 2.  Severe facet arthritis at C3-4, left greater than right.  Original Report Authenticated By: Larey Seat, M.D. DG Cervical Spine 2-3 Views  Narrative *RADIOLOGY REPORT*  Clinical Data: C3-4 anterior cervical discectomy and fusion.  CERVICAL SPINE - 2-3 VIEW  Comparison:  12/27/2010  Findings: Image labeled #1 demonstrates a probe at the C3-4 level.  The second image demonstrates anterior plate screw fixation at C3-4 with bony interbody graft fusion.  IMPRESSION:  1.  C3-4 ACDF.  Original Report Authenticated By: Carron Curie, M.D. DG Knee Complete 4 Views Left  Narrative CLINICAL DATA:  Persistent knee pain  EXAM: LEFT KNEE - COMPLETE 4+ VIEW  COMPARISON:  Radiograph May 11, 2020  FINDINGS: Three component left total knee arthroplasty without evidence of loosening or periprosthetic fracture. No acute osseous abnormality. Soft tissues are unremarkable.  IMPRESSION: Three component left total knee arthroplasty without evidence of loosening or periprosthetic fracture.   Electronically Signed By: Dahlia Bailiff MD On: 02/14/2021 23:40     Complexity Note: Imaging results reviewed. Results shared with Ms. Meixner, using Layman's terms.                         ROS  Cardiovascular: High blood pressure Pulmonary or Respiratory: No reported pulmonary signs or symptoms such as wheezing and difficulty taking a deep full breath (Asthma), difficulty blowing air out (Emphysema), coughing up mucus (Bronchitis), persistent dry cough, or temporary stoppage of breathing during sleep Neurological: No reported neurological signs or symptoms such as seizures, abnormal skin sensations, urinary and/or fecal incontinence, being born with an abnormal open spine and/or a tethered spinal cord Psychological-Psychiatric: No reported psychological or psychiatric signs or symptoms such as difficulty sleeping, anxiety, depression, delusions or hallucinations (schizophrenial), mood swings (bipolar disorders) or suicidal ideations or attempts Gastrointestinal: No reported gastrointestinal signs or symptoms such as vomiting or evacuating blood, reflux, heartburn, alternating episodes of diarrhea and constipation, inflamed or scarred liver, or pancreas or  irrregular and/or infrequent bowel movements Genitourinary: No reported renal or genitourinary signs or symptoms such as difficulty voiding or producing urine, peeing blood, non-functioning kidney, kidney stones, difficulty emptying the bladder, difficulty controlling the flow of urine, or chronic kidney disease Hematological: No reported hematological signs or symptoms such as prolonged bleeding, low or poor functioning platelets, bruising or bleeding easily, hereditary bleeding problems, low energy levels due to low hemoglobin or being anemic Endocrine: High blood sugar controlled without the use of insulin (NIDDM) Rheumatologic: No reported rheumatological signs and symptoms such as fatigue, joint pain, tenderness, swelling, redness, heat, stiffness, decreased range of motion, with or without associated rash Musculoskeletal: Negative for myasthenia gravis, muscular dystrophy, multiple sclerosis or malignant hyperthermia Work History: Retired  Allergies  Ms. Nikolic is allergic to nsaids and lisinopril.  Laboratory Chemistry Profile   Renal Lab Results  Component Value Date   BUN 16 05/10/2021   CREATININE 0.64 05/10/2021   BCR 25 05/10/2021   GFRAA 91 03/18/2020   GFRNONAA >60 04/13/2021   PROTEINUR NEGATIVE 04/12/2021     Electrolytes Lab Results  Component Value Date   NA 139 05/10/2021   K 4.9 05/10/2021   CL 103 05/10/2021   CALCIUM 9.4 05/10/2021   MG 1.7 05/10/2021   PHOS 3.5 02/04/2021     Hepatic Lab Results  Component Value Date   AST 14 (L) 02/05/2018   ALT 12 (L) 02/05/2018   ALBUMIN 4.0 02/04/2021   ALKPHOS 78  02/05/2018     ID Lab Results  Component Value Date   SARSCOV2NAA NEGATIVE 04/12/2021   STAPHAUREUS NEGATIVE 08/12/2013   MRSAPCR NEGATIVE 08/12/2013     Bone No results found for: VD25OH, VX793JQ3ESP, QZ3007MA2, QJ3354TG2, 25OHVITD1, 25OHVITD2, 25OHVITD3, TESTOFREE, TESTOSTERONE   Endocrine Lab Results  Component Value Date   GLUCOSE 119  (H) 05/10/2021   GLUCOSEU NEGATIVE 04/12/2021   HGBA1C 6.9 (H) 02/04/2021   TSH 1.824 04/13/2021   FREET4 1.13 (H) 02/06/2018     Neuropathy Lab Results  Component Value Date   HGBA1C 6.9 (H) 02/04/2021     CNS No results found for: COLORCSF, APPEARCSF, RBCCOUNTCSF, WBCCSF, POLYSCSF, LYMPHSCSF, EOSCSF, PROTEINCSF, GLUCCSF, JCVIRUS, CSFOLI, IGGCSF, LABACHR, ACETBL, LABACHR, ACETBL   Inflammation (CRP: Acute  ESR: Chronic) No results found for: CRP, ESRSEDRATE, LATICACIDVEN   Rheumatology No results found for: RF, ANA, LABURIC, URICUR, LYMEIGGIGMAB, LYMEABIGMQN, HLAB27   Coagulation Lab Results  Component Value Date   INR 1.10 08/12/2013   LABPROT 14.0 08/12/2013   APTT 33 08/12/2013   PLT 215 04/13/2021     Cardiovascular Lab Results  Component Value Date   TROPONINI <0.03 02/06/2018   HGB 10.6 (L) 04/13/2021   HCT 33.3 (L) 04/13/2021     Screening Lab Results  Component Value Date   SARSCOV2NAA NEGATIVE 04/12/2021   STAPHAUREUS NEGATIVE 08/12/2013   MRSAPCR NEGATIVE 08/12/2013     Cancer No results found for: CEA, CA125, LABCA2   Allergens No results found for: ALMOND, APPLE, ASPARAGUS, AVOCADO, BANANA, BARLEY, BASIL, BAYLEAF, GREENBEAN, LIMABEAN, WHITEBEAN, BEEFIGE, REDBEET, BLUEBERRY, BROCCOLI, CABBAGE, MELON, CARROT, CASEIN, CASHEWNUT, CAULIFLOWER, CELERY     Note: Lab results reviewed.  [place]  Drug: Ms. Newlun  reports no history of drug use. Alcohol:  reports previous alcohol use. Tobacco:  reports that she has never smoked. She has never used smokeless tobacco. Medical:  has a past medical history of GERD (gastroesophageal reflux disease), Hypertension, Right knee DJD, and Type II diabetes mellitus ([place]). Family: family history includes Cerebral aneurysm in her father; Diabetes in her mother.  Past Surgical History:  Procedure Laterality Date   BACK SURGERY     CATARACT EXTRACTION Bilateral 03/2016   CERVICAL [place] SURGERY  12/28/2010   "bulging  disc; went in on the left side of my neck" (08/19/2013)   JOINT REPLACEMENT     TOTAL KNEE ARTHROPLASTY Left 03/28/2011   Dr Noemi Chapel   TOTAL KNEE ARTHROPLASTY Right 08/19/2013   TOTAL KNEE ARTHROPLASTY Right 08/19/2013   Procedure: TOTAL KNEE ARTHROPLASTY;  Surgeon: Lorn Junes, MD;  Location: [facility];  Service: Orthopedics;  Laterality: Right;   Active Ambulatory Problems    Diagnosis Date Noted   Diabetes [facility])    Essential hypertension    GERD (gastroesophageal reflux disease)    History of bilateral knee replacement    Diabetes mellitus with no complication ([place]) 56/38/9373   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   Arthritis of knee, degenerative 03/16/2015   Sinus infection 03/16/2015   Hyperlipidemia 09/24/2015   Malignant HTN with heart disease, w/o CHF, w/o chronic kidney disease 02/05/2018   Sinus bradycardia 03/16/2018   PSVT (paroxysmal supraventricular tachycardia) ([place]) 03/16/2018   NSVT (nonsustained ventricular tachycardia) ([place]) 03/16/2018   Bradycardia 09/04/2019   PVC's (premature ventricular contractions) 09/04/2019   Leg edema 09/04/2019   Aortic valve regurgitation 10/23/2020   Pain due to total left knee replacement ([place]) 03/02/2021   Near syncope 04/12/2021   Chronic pain of left knee 06/17/2021  Chronic pain syndrome 06/17/2021   Resolved Ambulatory Problems    Diagnosis Date Noted   Right knee DJD    Past Medical History:  Diagnosis Date   Hypertension    Type II diabetes mellitus ([place])    Constitutional Exam  General appearance: Well nourished, well developed, and well hydrated. In no apparent acute distress Vitals:   06/17/21 1351  BP: (!) 164/70  Pulse: 65  Resp: 16  Temp: (!) 97.2 F (36.2 C)  TempSrc: Temporal  SpO2: 97%  Weight: 174 lb (78.9 kg)  Height: $Remove'5\' 3"'ujPpWNy$  (1.6 m)   BMI Assessment: Estimated body mass index is 30.82 kg/m as calculated from the following:   Height as of this encounter: $RemoveBeforeD'5\' 3"'[id number]$  (1.6 m).    Weight as of this encounter: 174 lb (78.9 kg).  BMI interpretation table: BMI level Category Range association with higher incidence of chronic pain  <18 kg/m2 Underweight   18.5-24.9 kg/m2 Ideal body weight   25-29.9 kg/m2 Overweight Increased incidence by 20%  30-34.9 kg/m2 Obese (Class I) Increased incidence by 68%  35-39.9 kg/m2 Severe obesity (Class II) Increased incidence by 136%  >40 kg/m2 Extreme obesity (Class III) Increased incidence by 254%   Patient's current BMI Ideal Body weight  Body mass index is 30.82 kg/m. Ideal body weight: 52.4 kg (115 lb 8.3 oz) Adjusted ideal body weight: 63 kg (138 lb 14.6 oz)   BMI Readings from Last 4 Encounters:  06/17/21 30.82 kg/m  05/14/21 27.79 kg/m  05/10/21 29.29 kg/m  04/23/21 27.12 kg/m   Wt Readings from Last 4 Encounters:  06/17/21 174 lb (78.9 kg)  05/14/21 167 lb (75.8 kg)  05/10/21 176 lb (79.8 kg)  04/23/21 163 lb (73.9 kg)    Psych/Mental status: Alert, oriented x 3 (person, place, & time)       Eyes: PERLA Respiratory: No evidence of acute respiratory distress  Lumbar Spine Area Exam  Skin & Axial Inspection: No masses, redness, or swelling Alignment: Symmetrical Functional ROM: Pain restricted ROM       Stability: No instability detected Muscle Tone/Strength: Functionally intact. No obvious neuro-muscular anomalies detected. Sensory (Neurological): Musculoskeletal pain pattern  Gait & Posture Assessment  Ambulation: Patient came in today in a wheel chair Gait: Limited. Using assistive device to ambulate Posture: Difficulty standing up straight, due to pain  Lower Extremity Exam    Side: Right lower extremity  Side: Left lower extremity  Stability: No instability observed          Stability: No instability observed          Skin & Extremity Inspection: Evidence of prior arthroplastic surgery  Skin & Extremity Inspection: Evidence of prior arthroplastic surgery  Functional ROM: Unrestricted ROM                   Functional ROM: Pain restricted ROM left knee                  Muscle Tone/Strength: Functionally intact. No obvious neuro-muscular anomalies detected.  Muscle Tone/Strength: Functionally intact. No obvious neuro-muscular anomalies detected.  Sensory (Neurological): Unimpaired        Sensory (Neurological): Arthropathic arthralgia        DTR: Patellar: 0: absent Achilles: deferred today Plantar: deferred today  DTR: Patellar: 0: absent Achilles: deferred today Plantar: deferred today  Palpation: No palpable anomalies  Palpation: No palpable anomalies    Assessment  Primary Diagnosis & Pertinent Problem List: The primary encounter diagnosis was Pain due to  total left knee replacement, sequela. Diagnoses of Chronic pain of left knee, History of bilateral knee replacement, and Chronic pain syndrome were also pertinent to this visit.  Visit Diagnosis (New problems to examiner): 1. Pain due to total left knee replacement, sequela   2. Chronic pain of left knee   3. History of bilateral knee replacement   4. Chronic pain syndrome    Plan of Care (Initial workup plan)   Risks and benefits of left knee diagnostic genicular nerve block discussed with patient.  Patient would like to proceed.  Future considerations could include left knee radiofrequency ablation of genicular nerves.  Orders Placed This Encounter  Procedures   GENICULAR NERVE BLOCK    Indication(s):  Sub-acute knee pain    Standing Status:   Future    Standing Expiration Date:   09/17/2021    Scheduling Instructions:     LEFT     Timeframe: As soon as schedule allows    Order Specific Question:   Where will this procedure be performed?    Answer:   [facility]    Provider-requested follow-up: No follow-ups on file.  Future Appointments  Date Time Provider [place]  07/22/2021  2:40 PM End, Harrell Gave, MD [facility]  08/18/2021 10:40 AM [place] ADVISOR [facility]  09/14/2021   1:20 PM Juline Patch, MD [facility]    Note by: Gillis Santa, MD Date: 06/17/2021; Time: 2:02 PM

## 2021-06-17 NOTE — Progress Notes (Signed)
Safety precautions to be maintained throughout the outpatient stay will include: orient to surroundings, keep bed in low position, maintain call bell within reach at all times, provide assistance with transfer out of bed and ambulation.  

## 2021-06-30 ENCOUNTER — Ambulatory Visit (HOSPITAL_BASED_OUTPATIENT_CLINIC_OR_DEPARTMENT_OTHER): Payer: Medicare Other | Admitting: Student in an Organized Health Care Education/Training Program

## 2021-06-30 ENCOUNTER — Ambulatory Visit
Admission: RE | Admit: 2021-06-30 | Discharge: 2021-06-30 | Disposition: A | Discharge status: Home / Self Care | Payer: Medicare Other | Source: Ambulatory Visit | Attending: Student in an Organized Health Care Education/Training Program | Admitting: Student in an Organized Health Care Education/Training Program

## 2021-06-30 ENCOUNTER — Other Ambulatory Visit: Payer: Self-pay

## 2021-06-30 ENCOUNTER — Encounter: Payer: Self-pay | Admitting: Student in an Organized Health Care Education/Training Program

## 2021-06-30 VITALS — BP 207/91 | HR 64 | Temp 97.3°F | Resp 19 | Ht 63.0 in | Wt 174.0 lb

## 2021-06-30 DIAGNOSIS — G894 Chronic pain syndrome: Secondary | ICD-10-CM

## 2021-06-30 DIAGNOSIS — Z96652 Presence of left artificial knee joint: Secondary | ICD-10-CM | POA: Diagnosis not present

## 2021-06-30 DIAGNOSIS — M25562 Pain in left knee: Secondary | ICD-10-CM | POA: Diagnosis not present

## 2021-06-30 DIAGNOSIS — T8484XS Pain due to internal orthopedic prosthetic devices, implants and grafts, sequela: Secondary | ICD-10-CM | POA: Insufficient documentation

## 2021-06-30 DIAGNOSIS — G8929 Other chronic pain: Secondary | ICD-10-CM

## 2021-06-30 MED ORDER — DEXAMETHASONE SODIUM PHOSPHATE 10 MG/ML IJ SOLN
10.0000 mg | Freq: Once | INTRAMUSCULAR | Status: AC
  Administered 2021-06-30: 10 mg
  Filled 2021-06-30: qty 1

## 2021-06-30 MED ORDER — ROPIVACAINE HCL 2 MG/ML IJ SOLN
9.0000 mL | Freq: Once | INTRAMUSCULAR | Status: AC
  Administered 2021-06-30: 9 mL via PERINEURAL

## 2021-06-30 MED ORDER — LIDOCAINE HCL (PF) 2 % IJ SOLN
INTRAMUSCULAR | Status: AC
  Filled 2021-06-30: qty 10

## 2021-06-30 MED ORDER — ROPIVACAINE HCL 2 MG/ML IJ SOLN
INTRAMUSCULAR | Status: AC
  Filled 2021-06-30: qty 20

## 2021-06-30 MED ORDER — LIDOCAINE HCL 2 % IJ SOLN
20.0000 mL | Freq: Once | INTRAMUSCULAR | Status: AC
  Administered 2021-06-30: 200 mg

## 2021-06-30 NOTE — Progress Notes (Signed)
PROVIDER NOTE: Information contained herein reflects review and annotations entered in association with encounter. Interpretation of such information and data should be left to medically-trained personnel. Information provided to patient can be located elsewhere in the medical record under "Patient Instructions". Document created using STT-dictation technology, any transcriptional errors that may result from process are unintentional.    Patient: [name]  Service Category: Procedure  Provider: Edward JollyBilal Kebron Pulse, MD  DOB: 1932-03-13  DOS: 06/30/2021  Location: [facility]  MRN: [id number]  Setting: Ambulatory - outpatient  Referring Provider: Duanne LimerickJones, Deanna C, MD  Type: Established Patient  Specialty: Interventional Pain Management  PCP: Duanne LimerickJones, Deanna C, MD   Primary Reason for Visit: Interventional Pain Management Treatment. CC: Knee Pain (left)  Procedure:          Anesthesia, Analgesia, Anxiolysis:  Type: Genicular Nerves Block (Superolateral, Superomedial, and Inferomedial Genicular Nerves)          CPT: 64450      Primary Purpose: Diagnostic Region: Lateral, Anterior, and Medial aspects of the knee joint, above and below the knee joint proper. Level: Superior and inferior to the knee joint. Target Area: For Genicular Nerve block(s), the targets are: the superolateral genicular nerve, located in the lateral distal portion of the femoral shaft as it curves to form the lateral epicondyle, in the region of the distal femoral metaphysis; the superomedial genicular nerve, located in the medial distal portion of the femoral shaft as it curves to form the medial epicondyle; and the inferomedial genicular nerve, located in the medial, proximal portion of the tibial shaft, as it curves to form the medial epicondyle, in the region of the proximal tibial metaphysis. Approach: Anterior, percutaneous, ipsilateral approach. Laterality: Left knee   Local Anesthetic: Lidocaine  1-2%  Position: Modified Fowler's position with pillows under the targeted knee(s).   Indications: 1. Pain due to total left knee replacement, sequela   2. Chronic pain of left knee   3. Chronic pain syndrome    Pain Score: Pre-procedure: 5 /10 Post-procedure: 0-No pain/10   Pre-op H&P Assessment:  [name] is a 87 y.o. (year old), female patient, seen today for interventional treatment. She  has a past surgical history that includes Total knee arthroplasty (Left, 03/28/2011); Cervical disc surgery (12/28/2010); Total knee arthroplasty (Right, 08/19/2013); Joint replacement; Back surgery; Total knee arthroplasty (Right, 08/19/2013); and Cataract extraction (Bilateral, 03/2016). [name] has a current medication list which includes the following prescription(s): cyanocobalamin, diclofenac sodium, docusate sodium, glucose blood, guaifenesin-dextromethorphan, loratadine, metformin, mometasone, montelukast, olmesartan, omeprazole, and carbamide peroxide. Her primarily concern today is the Knee Pain (left)  Initial Vital Signs:  Pulse/HCG Rate: 60  Temp: (!) 97.3 F (36.3 C) Resp: 16 BP: (!) 164/66 SpO2: 98 %  BMI: Estimated body mass index is 30.82 kg/m as calculated from the following:   Height as of this encounter: 5\' 3"  (1.6 m).   Weight as of this encounter: 174 lb (78.9 kg).  Risk Assessment: Allergies: Reviewed. She is allergic to nsaids and lisinopril.  Allergy Precautions: None required Coagulopathies: Reviewed. None identified.  Blood-thinner therapy: None at this time Active Infection(s): Reviewed. None identified. [name] is afebrile  Site Confirmation: [name] was asked to confirm the procedure and laterality before marking the site Procedure checklist: Completed Consent: Before the procedure and under the influence of no sedative(s), amnesic(s), or anxiolytics, the patient was informed of the treatment options, risks and possible complications. To fulfill our  ethical and legal obligations, as recommended  by the American Medical Association's Code of Ethics, I have informed the patient of my clinical impression; the nature and purpose of the treatment or procedure; the risks, benefits, and possible complications of the intervention; the alternatives, including doing nothing; the risk(s) and benefit(s) of the alternative treatment(s) or procedure(s); and the risk(s) and benefit(s) of doing nothing. The patient was provided information about the general risks and possible complications associated with the procedure. These may include, but are not limited to: failure to achieve desired goals, infection, bleeding, organ or nerve damage, allergic reactions, paralysis, and death. In addition, the patient was informed of those risks and complications associated to the procedure, such as failure to decrease pain; infection; bleeding; organ or nerve damage with subsequent damage to sensory, motor, and/or autonomic systems, resulting in permanent pain, numbness, and/or weakness of one or several areas of the body; allergic reactions; (i.e.: anaphylactic reaction); and/or death. Furthermore, the patient was informed of those risks and complications associated with the medications. These include, but are not limited to: allergic reactions (i.e.: anaphylactic or anaphylactoid reaction(s)); adrenal axis suppression; blood sugar elevation that in diabetics may result in ketoacidosis or comma; water retention that in patients with history of congestive heart failure may result in shortness of breath, pulmonary edema, and decompensation with resultant heart failure; weight gain; swelling or edema; medication-induced neural toxicity; particulate matter embolism and blood vessel occlusion with resultant organ, and/or nervous system infarction; and/or aseptic necrosis of one or more joints. Finally, the patient was informed that Medicine is not an exact science; therefore, there is also  the possibility of unforeseen or unpredictable risks and/or possible complications that may result in a catastrophic outcome. The patient indicated having understood very clearly. We have given the patient no guarantees and we have made no promises. Enough time was given to the patient to ask questions, all of which were answered to the patient's satisfaction. Ms. Starrett has indicated that she wanted to continue with the procedure. Attestation: I, the ordering provider, attest that I have discussed with the patient the benefits, risks, side-effects, alternatives, likelihood of achieving goals, and potential problems during recovery for the procedure that I have provided informed consent. Date  Time: 06/30/2021  9:55 AM  Pre-Procedure Preparation:  Monitoring: As per clinic protocol. Respiration, ETCO2, SpO2, BP, heart rate and rhythm monitor placed and checked for adequate function Safety Precautions: Patient was assessed for positional comfort and pressure points before starting the procedure. Time-out: I initiated and conducted the "Time-out" before starting the procedure, as per protocol. The patient was asked to participate by confirming the accuracy of the "Time Out" information. Verification of the correct person, site, and procedure were performed and confirmed by me, the nursing staff, and the patient. "Time-out" conducted as per Joint Commission's Universal Protocol (UP.01.01.01). Time: 1052  Description of Procedure:          Area Prepped: Entire knee area, from mid-thigh to mid-shin, lateral, anterior, and medial aspects. DuraPrep (Iodine Povacrylex [0.7% available iodine] and Isopropyl Alcohol, 74% w/w) Safety Precautions: Aspiration looking for blood return was conducted prior to all injections. At no point did we inject any substances, as a needle was being advanced. No attempts were made at seeking any paresthesias. Safe injection practices and needle disposal techniques used.  Medications properly checked for expiration dates. SDV (single dose vial) medications used. Description of the Procedure: Protocol guidelines were followed. The patient was placed in position over the procedure table. The target area was identified and the  area prepped in the usual manner. Skin & deeper tissues infiltrated with local anesthetic. Appropriate amount of time allowed to pass for local anesthetics to take effect. The procedure needles were then advanced to the target area. Proper needle placement secured. Negative aspiration confirmed. Solution injected in intermittent fashion, asking for systemic symptoms every 0.5cc of injectate. The needles were then removed and the area cleansed, making sure to leave some of the prepping solution back to take advantage of its long term bactericidal properties.  Vitals:   06/30/21 1045 06/30/21 1050 06/30/21 1055 06/30/21 1100  BP: (!) 187/79 (!) 197/87 (!) 201/88 (!) 207/91  Pulse: 65 (!) 57 64   Resp: 16 16 19    Temp:      TempSrc:      SpO2: 99% 100% 98%   Weight:      Height:        Start Time: 1052 hrs. End Time: 1057 hrs. Materials:  Needle(s) Type: Spinal Needle Gauge: 22G Length: 3.5-in Medication(s): Please see orders for medications and dosing details. 6 ccsolution made of 5 cc of 0.2% ropivacaine, 1 cc of Decadron 10 mg/cc; 2 cc injected at each level above for the left genicular nerves. Imaging Guidance (Non-Spinal):          Type of Imaging Technique: Fluoroscopy Guidance (Non-Spinal) Indication(s): Assistance in needle guidance and placement for procedures requiring needle placement in or near specific anatomical locations not easily accessible without such assistance. Exposure Time: Please see nurses notes. Contrast: None used. Fluoroscopic Guidance: I was personally present during the use of fluoroscopy. "Tunnel Vision Technique" used to obtain the best possible view of the target area. Parallax error corrected before  commencing the procedure. "Direction-depth-direction" technique used to introduce the needle under continuous pulsed fluoroscopy. Once target was reached, antero-posterior, oblique, and lateral fluoroscopic projection used confirm needle placement in all planes. Images permanently stored in EMR. Interpretation: No contrast injected. I personally interpreted the imaging intraoperatively. Adequate needle placement confirmed in multiple planes. Permanent images saved into the patient's record.   Post-operative Assessment:  Post-procedure Vital Signs:  Pulse/HCG Rate: 64 (Paired PVC's)  Temp: (!) 97.3 F (36.3 C) Resp: 19 BP: (!) 207/91 (Md notiifed, discussed with daughter need to call MD today and to cont to watch patient , pt also aware.) SpO2: 98 %  EBL: None  Complications: No immediate post-treatment complications observed by team, or reported by patient.  Note: The patient tolerated the entire procedure well. A repeat set of vitals were taken after the procedure and the patient was kept under observation following institutional policy, for this type of procedure. Post-procedural neurological assessment was performed, showing return to baseline, prior to discharge. The patient was provided with post-procedure discharge instructions, including a section on how to identify potential problems. Should any problems arise concerning this procedure, the patient was given instructions to immediately contact , at any time, without hesitation. In any case, we plan to contact the patient by telephone for a follow-up status report regarding this interventional procedure.  Comments:  No additional relevant information.  Plan of Care  Orders:  Orders Placed This Encounter  Procedures   DG PAIN CLINIC C-ARM 1-60 MIN NO REPORT    Intraoperative interpretation by procedural physician at [facility].    Standing Status:   Standing    Number of Occurrences:   1    Order Specific Question:    Reason for exam:    Answer:   Assistance in needle guidance and  placement for procedures requiring needle placement in or near specific anatomical locations not easily accessible without such assistance.   Of note during the procedure, patient's blood pressures, her systolic was elevated to the 190s and 200s.  She was asymptomatic.  Not endorsing any headaches, shortness of breath or chest pain.  She states that she took her blood pressure medications this morning.  This information was relayed to her daughter and she was instructed to follow-up with her PCP.  She will return in 4 weeks for postprocedural evaluation virtual to see how she is doing after her left knee genicular nerve block to consider left knee genicular RFA.  Medications ordered for procedure: Meds ordered this encounter  Medications   lidocaine (XYLOCAINE) 2 % (with pres) injection 400 mg   ropivacaine (PF) 2 mg/mL (0.2%) (NAROPIN) injection 9 mL   dexamethasone (DECADRON) injection 10 mg   Medications administered: We administered lidocaine, ropivacaine (PF) 2 mg/mL (0.2%), and dexamethasone.  See the medical record for exact dosing, route, and time of administration.  Follow-up plan:   Return in about 4 weeks (around 07/28/2021) for Post Procedure Evaluation, virtual.      Left knee genicular NB 06/30/21  Recent Visits Date Type Provider Dept  06/17/21 Office Visit Edward Jolly, MD [facility]  Showing recent visits within past 90 days and meeting all other requirements Today's Visits Date Type Provider Dept  06/30/21 Procedure visit Edward Jolly, MD [facility]  Showing today's visits and meeting all other requirements Future Appointments Date Type Provider Dept  07/21/21 Appointment Edward Jolly, MD [facility]  Showing future appointments within next 90 days and meeting all other requirements Disposition: Discharge home  Discharge (Date  Time): 06/30/2021; 1103 hrs.   Primary Care  Physician: Duanne Limerick, MD Location: [facility] Note by: Edward Jolly, MD Date: 06/30/2021; Time: 11:54 AM  Disclaimer:  Medicine is not an exact science. The only guarantee in medicine is that nothing is guaranteed. It is important to note that the decision to proceed with this intervention was based on the information collected from the patient. The Data and conclusions were drawn from the patient's questionnaire, the interview, and the physical examination. Because the information was provided in large part by the patient, it cannot be guaranteed that it has not been purposely or unconsciously manipulated. Every effort has been made to obtain as much relevant data as possible for this evaluation. It is important to note that the conclusions that lead to this procedure are derived in large part from the available data. Always take into account that the treatment will also be dependent on availability of resources and existing treatment guidelines, considered by other Pain Management Practitioners as being common knowledge and practice, at the time of the intervention. For Medico-Legal purposes, it is also important to point out that variation in procedural techniques and pharmacological choices are the acceptable norm. The indications, contraindications, technique, and results of the above procedure should only be interpreted and judged by a Board-Certified Interventional Pain Specialist with extensive familiarity and expertise in the same exact procedure and technique.

## 2021-07-01 ENCOUNTER — Ambulatory Visit (INDEPENDENT_AMBULATORY_CARE_PROVIDER_SITE_OTHER): Payer: Medicare Other | Admitting: Family Medicine

## 2021-07-01 ENCOUNTER — Telehealth: Payer: Self-pay

## 2021-07-01 ENCOUNTER — Encounter: Payer: Self-pay | Admitting: Family Medicine

## 2021-07-01 VITALS — BP 136/80 | HR 58 | Ht 63.0 in | Wt 167.0 lb

## 2021-07-01 DIAGNOSIS — F418 Other specified anxiety disorders: Secondary | ICD-10-CM

## 2021-07-01 DIAGNOSIS — I10 Essential (primary) hypertension: Secondary | ICD-10-CM | POA: Diagnosis not present

## 2021-07-01 DIAGNOSIS — R030 Elevated blood-pressure reading, without diagnosis of hypertension: Secondary | ICD-10-CM

## 2021-07-01 MED ORDER — AMLODIPINE BESYLATE 2.5 MG PO TABS: 2.5000 mg | ORAL_TABLET | Freq: Every day | ORAL | 0 refills | Status: DC

## 2021-07-01 MED ORDER — ALPRAZOLAM 0.25 MG PO TABS: 0.2500 mg | ORAL_TABLET | ORAL | 0 refills | Status: DC | PRN

## 2021-07-01 NOTE — Progress Notes (Signed)
Date:  07/01/2021   Name:  [name]   DOB:  1932/02/08   MRN:  [id number]   Chief Complaint: Hypertension  Hypertension This is a chronic (yesterday had a high reading at specialist) problem. The problem has been waxing and waning since onset. The problem is controlled. Associated symptoms include anxiety. Pertinent negatives include no blurred vision, chest pain, headaches, malaise/fatigue, neck pain, orthopnea, palpitations, peripheral edema, PND, shortness of breath or sweats.   Lab Results  Component Value Date   CREATININE 0.64 05/10/2021   BUN 16 05/10/2021   NA 139 05/10/2021   K 4.9 05/10/2021   CL 103 05/10/2021   CO2 22 05/10/2021   Lab Results  Component Value Date   CHOL 193 02/04/2021   HDL 70 02/04/2021   LDLCALC 108 (H) 02/04/2021   TRIG 82 02/04/2021   CHOLHDL 2.2 07/23/2019   Lab Results  Component Value Date   TSH 1.824 04/13/2021   Lab Results  Component Value Date   HGBA1C 6.9 (H) 02/04/2021   Lab Results  Component Value Date   WBC 5.4 04/13/2021   HGB 10.6 (L) 04/13/2021   HCT 33.3 (L) 04/13/2021   MCV 84.3 04/13/2021   PLT 215 04/13/2021   Lab Results  Component Value Date   ALT 12 (L) 02/05/2018   AST 14 (L) 02/05/2018   ALKPHOS 78 02/05/2018   BILITOT 0.5 02/05/2018     Review of Systems  Constitutional:  Negative for chills, fever and malaise/fatigue.  HENT:  Negative for drooling, ear discharge, ear pain and sore throat.   Eyes:  Negative for blurred vision.  Respiratory:  Negative for cough, shortness of breath and wheezing.   Cardiovascular:  Negative for chest pain, palpitations, orthopnea, leg swelling and PND.  Gastrointestinal:  Negative for abdominal pain, blood in stool, constipation, diarrhea and nausea.  Endocrine: Negative for polydipsia.  Genitourinary:  Negative for dysuria, frequency, hematuria and urgency.  Musculoskeletal:  Negative for back pain, myalgias and neck pain.  Skin:  Negative for rash.   Allergic/Immunologic: Negative for environmental allergies.  Neurological:  Negative for dizziness and headaches.  Hematological:  Does not bruise/bleed easily.  Psychiatric/Behavioral:  Negative for suicidal ideas. The patient is not nervous/anxious.    Patient Active Problem List   Diagnosis Date Noted   Chronic pain of left knee 06/17/2021   Chronic pain syndrome 06/17/2021   Near syncope 04/12/2021   Pain due to total left knee replacement (HCC) 03/02/2021   Aortic valve regurgitation 10/23/2020   Bradycardia 09/04/2019   PVC's (premature ventricular contractions) 09/04/2019   Leg edema 09/04/2019   Sinus bradycardia 03/16/2018   PSVT (paroxysmal supraventricular tachycardia) (HCC) 03/16/2018   NSVT (nonsustained ventricular tachycardia) (HCC) 03/16/2018   Malignant HTN with heart disease, w/o CHF, w/o chronic kidney disease 02/05/2018   Hyperlipidemia 09/24/2015   Diabetes mellitus with no complication (HCC) 03/16/2015   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   Arthritis of knee, degenerative 03/16/2015   Sinus infection 03/16/2015   Diabetes (HCC)    Essential hypertension    GERD (gastroesophageal reflux disease)    History of bilateral knee replacement     Allergies  Allergen Reactions   Nsaids    Lisinopril     Cough    Past Surgical History:  Procedure Laterality Date   BACK SURGERY     CATARACT EXTRACTION Bilateral 03/2016   CERVICAL DISC SURGERY  12/28/2010   "bulging disc; went in on the left side  of my neck" (08/19/2013)   JOINT REPLACEMENT     TOTAL KNEE ARTHROPLASTY Left 03/28/2011   Dr Thurston Hole   TOTAL KNEE ARTHROPLASTY Right 08/19/2013   TOTAL KNEE ARTHROPLASTY Right 08/19/2013   Procedure: TOTAL KNEE ARTHROPLASTY;  Surgeon: Nilda Simmer, MD;  Location: [facility] OR;  Service: Orthopedics;  Laterality: Right;    Social History   Tobacco Use   Smoking status: Never   Smokeless tobacco: Never  Vaping Use   Vaping Use: Never used   Substance Use Topics   Alcohol use: Not Currently   Drug use: Never     Medication list has been reviewed and updated.  Current Meds  Medication Sig   cyanocobalamin 100 MCG tablet Take 1 tablet by mouth daily.    diclofenac sodium (VOLTAREN) 1 % GEL Apply 4 g topically 4 (four) times daily as needed.   docusate sodium (COLACE) 50 MG capsule Take 1 capsule (50 mg total) by mouth 2 (two) times daily.   glucose blood (ONE TOUCH ULTRA TEST) test strip USE 1 STRIP TO CHECK GLUCOSE ONCE DAILY   loratadine (CLARITIN) 10 MG tablet Take 1 tablet (10 mg total) by mouth daily.   metFORMIN (GLUCOPHAGE) 500 MG tablet TAKE 1 TABLET BY MOUTH TWICE DAILY WITH MEALS (LAST  TIME  FILLING)   mometasone (NASONEX) 50 MCG/ACT nasal spray Place 2 sprays into the nose daily.   montelukast (SINGULAIR) 10 MG tablet Take 1 tablet (10 mg total) by mouth at bedtime.   olmesartan (BENICAR) 20 MG tablet Take 1 tablet (20 mg total) by mouth at bedtime.   omeprazole (PRILOSEC) 20 MG capsule Take 1 capsule (20 mg total) by mouth daily.   [DISCONTINUED] guaiFENesin-dextromethorphan (ROBITUSSIN DM) 100-10 MG/5ML syrup Take 5 mLs by mouth every 4 (four) hours as needed for cough.    PHQ 2/9 Scores 06/30/2021 05/20/2021 04/23/2021 03/12/2021  PHQ - 2 Score 0 0 0 0  PHQ- 9 Score - 0 0 0    GAD 7 : Generalized Anxiety Score 05/20/2021 04/23/2021 03/12/2021 03/18/2020  Nervous, Anxious, on Edge 0 0 0 0  Control/stop worrying 0 0 0 0  Worry too much - different things 0 0 0 0  Trouble relaxing 0 0 0 0  Restless 0 0 0 0  Easily annoyed or irritable 0 0 0 0  Afraid - awful might happen 0 0 0 0  Total GAD 7 Score 0 0 0 0    BP Readings from Last 3 Encounters:  07/01/21 136/80  06/30/21 (!) 207/91  06/17/21 (!) 164/70    Physical Exam HENT:     Head: Normocephalic.  Cardiovascular:     Rate and Rhythm: Normal rate.     Pulses: Normal pulses.     Heart sounds: No murmur heard.   No gallop.  Pulmonary:     Breath  sounds: Normal breath sounds. No wheezing or rhonchi.  Musculoskeletal:     Cervical back: Normal range of motion. No rigidity.  Neurological:     Mental Status: She is alert.    Wt Readings from Last 3 Encounters:  07/01/21 167 lb (75.8 kg)  06/30/21 174 lb (78.9 kg)  06/17/21 174 lb (78.9 kg)    BP 136/80   Pulse (!) 58   Ht 5\' 3"  (1.6 m)   Wt 167 lb (75.8 kg)   BMI 29.58 kg/m   Assessment and Plan:  1. Essential hypertension Chronic.  Controlled.  Blood pressure 136/80.  Patient will be continue  with the olmesartan 20 mg once a day.  We will recheck on an as-needed basis.  2. Elevated blood pressure, situational Relatively new onset with episodic increase in blood pressure if patient is having procedures on her knee and pain is involved.  Seen that the blood pressure always returns to normal when patient has been allowed to rest for 15 to 30 minutes and thinking that this is more of a situational circumstance.  I have elected in the future to provide some amlodipine 2.5 mg (patient took 5 mg daily previously) and this is to be taken along with her olmesartan that she is taking on a daily basis. - amLODipine (NORVASC) 2.5 MG tablet; Take 1 tablet (2.5 mg total) by mouth daily. Take only prior to procedure  Dispense: 10 tablet; Refill: 0  3. Situational anxiety In addition when she is having a procedure that patient may be become anxious and the blood pressure may increase and that she can take 1/2-1 alprazolam 0.25 for anxiety. - ALPRAZolam (XANAX) 0.25 MG tablet; Take 1 tablet (0.25 mg total) by mouth as needed for anxiety. To be taken if anxious prior to procedure  Dispense: 20 tablet; Refill: 0

## 2021-07-01 NOTE — Telephone Encounter (Signed)
Post procedure phone call.  LM 

## 2021-07-19 ENCOUNTER — Telehealth: Payer: Self-pay

## 2021-07-19 NOTE — Telephone Encounter (Signed)
LM for patient to call office for pre virtual appointment questions.  

## 2021-07-20 ENCOUNTER — Telehealth: Payer: Self-pay

## 2021-07-20 ENCOUNTER — Other Ambulatory Visit: Payer: Self-pay

## 2021-07-20 ENCOUNTER — Ambulatory Visit
Payer: Medicare Other | Attending: Student in an Organized Health Care Education/Training Program | Admitting: Student in an Organized Health Care Education/Training Program

## 2021-07-20 DIAGNOSIS — G894 Chronic pain syndrome: Secondary | ICD-10-CM

## 2021-07-20 DIAGNOSIS — M25562 Pain in left knee: Secondary | ICD-10-CM | POA: Diagnosis not present

## 2021-07-20 DIAGNOSIS — T8484XS Pain due to internal orthopedic prosthetic devices, implants and grafts, sequela: Secondary | ICD-10-CM

## 2021-07-20 DIAGNOSIS — G8929 Other chronic pain: Secondary | ICD-10-CM

## 2021-07-20 DIAGNOSIS — Z96652 Presence of left artificial knee joint: Secondary | ICD-10-CM

## 2021-07-20 DIAGNOSIS — Z96653 Presence of artificial knee joint, bilateral: Secondary | ICD-10-CM

## 2021-07-20 MED ORDER — DICLOFENAC SODIUM 1 % EX GEL: 4.0000 g | Freq: Four times a day (QID) | CUTANEOUS | 99 refills | Status: DC | PRN

## 2021-07-20 NOTE — Progress Notes (Signed)
Patient: [name]  Service Category: E/M  Provider: Gillis Santa, MD  DOB: [birth year]  DOS: 07/20/2021  Location: Office  MRN: [id number]  Setting: Ambulatory outpatient  Referring Provider: Juline Patch, MD  Type: Established Patient  Specialty: Interventional Pain Management  PCP: Juline Patch, MD  Location: Home  Delivery: TeleHealth     Virtual Encounter - Pain Management PROVIDER NOTE: Information contained herein reflects review and annotations entered in association with encounter. Interpretation of such information and data should be left to medically-trained personnel. Information provided to patient can be located elsewhere in the medical record under "Patient Instructions". Document created using STT-dictation technology, any transcriptional errors that may result from process are unintentional.    Contact & Pharmacy Preferred: [phone] Home: [phone] (home) Mobile: [phone] (mobile) E-mail: No e-mail address on record  [place], [place] - [address] [place] [address] Phone: [phone] Fax: [phone]  [id number] [id number]: [phone]: [phone]  Pre-screening  Ms. Baity offered "in-person" vs "virtual" encounter. She indicated preferring virtual for this encounter.   Reason COVID-19*  Social distancing based on CDC and AMA recommendations.   I contacted DKerri Percheson 07/20/2021 via telephone.      I clearly identified myself as BGillis Santa MD. I verified that I was speaking with the correct person using two identifiers (Name: DBROCK MOKRY and date of birth: [birth year].  Consent I sought verbal advanced consent from [name] virtual visit interactions. I informed Ms. CArenivasof possible security and privacy concerns, risks, and limitations associated with providing  "not-in-person" medical evaluation and management services. I also informed Ms. CBlascoof the availability of "in-person" appointments. Finally, I informed her that there would be a charge for the virtual visit and that she could be  personally, fully or partially, financially responsible for it. Ms. CHafnerexpressed understanding and agreed to proceed.   Historic Elements   Ms. DNYIAH PIANKAis a 87y.o. year old, female patient evaluated today after our last contact on 06/30/2021. Ms. CDeines has a past medical history of GERD (gastroesophageal reflux disease), Hypertension, Right knee DJD, and Type II diabetes mellitus ([place]. She also  has a past surgical history that includes Total knee arthroplasty (Left, 03/28/2011); Cervical disc surgery (12/28/2010); Total knee arthroplasty (Right, 08/19/2013); Joint replacement; Back surgery; Total knee arthroplasty (Right, 08/19/2013); and Cataract extraction (Bilateral, 03/2016). Ms. CMocciohas a current medication list which includes the following prescription(s): alprazolam, amlodipine, cyanocobalamin, diclofenac sodium, docusate sodium, glucose blood, loratadine, metformin, mometasone, montelukast, olmesartan, and omeprazole. She  reports that she has never smoked. She has never used smokeless tobacco. She reports that she does not currently use alcohol. She reports that she does not use drugs. Ms. CGoveris allergic to nsaids and lisinopril.   HPI  Today, she is being contacted for a post-procedure assessment.   Post-Procedure Evaluation  Procedure (06/30/2021):  Left knee genicular nerve block #1  Anxiolysis: Please see nurses note.  Effectiveness during initial hour after procedure (Ultra-Short Term Relief): 100%  Local anesthetic used: Long-acting (4-6 hours) Effectiveness: Defined as any analgesic benefit obtained secondary to the administration of local anesthetics. This carries significant diagnostic value as to the etiological location, or  anatomical origin, of the pain. Duration of benefit is expected to coincide with the duration of the local anesthetic used.  Effectiveness  during initial 4-6 hours after procedure (Short-Term Relief): 100%  Long-term benefit: Defined as any relief past the pharmacologic duration of the local anesthetics.  Effectiveness past the initial 6 hours after procedure (Long-Term Relief): 50 %   Benefits, current: Defined as benefit present at the time of this evaluation.   Analgesia:  50% but having stiffness in left knee  Function:  somewhat improved    Laboratory Chemistry Profile   Renal Lab Results  Component Value Date   BUN 16 05/10/2021   CREATININE 0.64 05/10/2021   BCR 25 05/10/2021   GFRAA 91 03/18/2020   GFRNONAA >60 04/13/2021    Hepatic Lab Results  Component Value Date   AST 14 (L) 02/05/2018   ALT 12 (L) 02/05/2018   ALBUMIN 4.0 02/04/2021   ALKPHOS 78 02/05/2018    Electrolytes Lab Results  Component Value Date   NA 139 05/10/2021   K 4.9 05/10/2021   CL 103 05/10/2021   CALCIUM 9.4 05/10/2021   MG 1.7 05/10/2021   PHOS 3.5 02/04/2021    Bone No results found for: VD25OH, VD125OH2TOT, JW1191YN8, GN5621HY8, 25OHVITD1, 25OHVITD2, 25OHVITD3, TESTOFREE, TESTOSTERONE  Inflammation (CRP: Acute Phase) (ESR: Chronic Phase) No results found for: CRP, ESRSEDRATE, LATICACIDVEN       Note: Above Lab results reviewed.   Assessment  The primary encounter diagnosis was Pain due to total left knee replacement, sequela. Diagnoses of Chronic pain of left knee, History of bilateral knee replacement, and Chronic pain syndrome were also pertinent to this visit.  Plan of Care    Ms. [name] has a current medication list which includes the following long-term medication(s): amlodipine, loratadine, metformin, mometasone, montelukast, olmesartan, and omeprazole.  1. Pain due to total left knee replacement, sequela -Status post right knee genicular nerve block 1 with  positive response.  Discussed repeating.  Future considerations include genicular nerve radiofrequency ablation. - diclofenac Sodium (VOLTAREN) 1 % GEL; Apply 4 g topically 4 (four) times daily as needed.  Dispense: 350 g; Refill: PRN - GENICULAR NERVE BLOCK; Future  Pharmacotherapy (Medications Ordered): Meds ordered this encounter  Medications   diclofenac Sodium (VOLTAREN) 1 % GEL    Sig: Apply 4 g topically 4 (four) times daily as needed.    Dispense:  350 g    Refill:  PRN    Orders:  Orders Placed This Encounter  Procedures   GENICULAR NERVE BLOCK    Indication(s):  Sub-acute knee pain    Standing Status:   Future    Standing Expiration Date:   10/20/2021    Scheduling Instructions:     Side: LEFT     Sedation: without     Timeframe: As soon as schedule allows    Order Specific Question:   Where will this procedure be performed?    Answer:   [facility]    Follow-up plan:   Return in about 2 weeks (around 08/03/2021) for L GNB , without sedation.     Left knee genicular NB 06/30/21   Recent Visits Date Type Provider Dept  06/30/21 Procedure visit Gillis Santa, MD [facility]  06/17/21 Office Visit Gillis Santa, MD [facility]  Showing recent visits within past 90 days and meeting all other requirements Today's Visits Date Type Provider Dept  07/20/21 Telemedicine Gillis Santa, MD [facility]  Showing today's visits and meeting all other requirements Future Appointments No visits were found meeting these conditions. Showing future appointments within next 90 days and meeting  all other requirements I discussed the assessment and treatment plan with the patient. The patient was provided an opportunity to ask questions and all were answered. The patient agreed with the plan and demonstrated an understanding of the instructions.  Patient advised to call back or seek an in-person evaluation if the symptoms or condition  worsens.  Duration of encounter: 22mnutes.  Note by: BGillis Santa MD Date: 07/20/2021; Time: 2:18 PM

## 2021-07-20 NOTE — Telephone Encounter (Signed)
LM for patient to call office to go over pre procedure questions.

## 2021-07-21 ENCOUNTER — Telehealth: Payer: Medicare Other | Admitting: Student in an Organized Health Care Education/Training Program

## 2021-07-22 ENCOUNTER — Other Ambulatory Visit: Payer: Self-pay

## 2021-07-22 ENCOUNTER — Ambulatory Visit (INDEPENDENT_AMBULATORY_CARE_PROVIDER_SITE_OTHER): Payer: Medicare Other | Admitting: Internal Medicine

## 2021-07-22 VITALS — BP 162/80 | HR 73 | Ht 63.0 in | Wt 178.2 lb

## 2021-07-22 DIAGNOSIS — I493 Ventricular premature depolarization: Secondary | ICD-10-CM | POA: Diagnosis not present

## 2021-07-22 DIAGNOSIS — I472 Ventricular tachycardia: Secondary | ICD-10-CM

## 2021-07-22 DIAGNOSIS — I38 Endocarditis, valve unspecified: Secondary | ICD-10-CM

## 2021-07-22 DIAGNOSIS — I471 Supraventricular tachycardia: Secondary | ICD-10-CM | POA: Diagnosis not present

## 2021-07-22 DIAGNOSIS — I4729 Other ventricular tachycardia: Secondary | ICD-10-CM

## 2021-07-22 DIAGNOSIS — I10 Essential (primary) hypertension: Secondary | ICD-10-CM | POA: Diagnosis not present

## 2021-07-22 MED ORDER — HYDROCHLOROTHIAZIDE 12.5 MG PO CAPS: 12.5000 mg | ORAL_CAPSULE | Freq: Every day | ORAL | 1 refills | Status: DC

## 2021-07-22 NOTE — Patient Instructions (Signed)
Medication Instructions:   Your physician has recommended you make the following change in your medication:   START Hydrochlorothiazide (HCTZ) 12.5 mg daily   *If you need a refill on your cardiac medications before your next appointment, please call your pharmacy*   Lab Work:  None ordered  Testing/Procedures:  None ordered   Follow-Up: At [facility], you and your health needs are our priority.  As part of our continuing mission to provide you with exceptional heart care, we have created designated Provider Care Teams.  These Care Teams include your primary Cardiologist (physician) and Advanced Practice Providers (APPs -  Physician Assistants and Nurse Practitioners) who all work together to provide you with the care you need, when you need it.  We recommend signing up for the patient portal called "MyChart".  Sign up information is provided on this After Visit Summary.  MyChart is used to connect with patients for Virtual Visits (Telemedicine).  Patients are able to view lab/test results, encounter notes, upcoming appointments, etc.  Non-urgent messages can be sent to your provider as well.   To learn more about what you can do with MyChart, go to [email or web address].    Your next appointment:   1 month(s) (APP)  The format for your next appointment:   In Person  Provider:   You will see one of the following Advanced Practice Providers on your designated Care Team:   Nicolasa Ducking, NP Eula Listen, PA-C Marisue Ivan, PA-C Cadence [place], [place]

## 2021-07-22 NOTE — Progress Notes (Signed)
Follow-up Outpatient Visit Date: 07/22/2021  Primary Care Provider: Duanne Limerick, MD [address] [place] [address]  Chief Complaint: Follow-up hypertension  HPI:  [name] is a 87 y.o. female with history of aortic and mitral valve regurgitation, NSVT, PSVT, hypertension, and type 2 diabetes mellitus, who presents for follow-up of hypertension.  She was last seen by Marisue Ivan, NP, in late June, at which time an she was feeling well.  Blood pressure was noted to be quite elevated at 170/80.  She was advised to restart olmesartan 20 mg nightly.  Today, [name] reports that she is back on olmesartan but has stopped taking amlodipine for unclear reasons.  She feels well, denying chest pain, shortness of breath, palpitations, lightheadedness, and edema.  She notes occasional headaches.  --------------------------------------------------------------------------------------------------  Cardiovascular History & Procedures: Cardiovascular Problems: Bradycardia with paroxysmal SVT and NSVT   Risk Factors: Hypertension, age greater than 47, and obesity   Cath/PCI: None   CV Surgery: None   EP Procedures and Devices: 14-day event monitor (04/14/2021): Rhythm with frequent PACs and multiple episodes of PSVT lasting up to 4 minutes, 11 seconds.  Rare PVCs. 14-day event monitor (02/07/2018): Predominantly sinus rhythm with occasional PVC's and rare PAC's.  Brief runs of SVT and NSVT noted.   Non-Invasive Evaluation(s): TTE (04/13/2021): Normal LV size with mild LVH.  LVEF 60-65% with grade 1 diastolic dysfunction.  Normal RV size and function.  Normal biatrial size.  Mild mitral and tricuspid regurgitation.  Mild aortic regurgitation. Pharmacologic myocardial perfusion stress test (02/07/2018): Normal study without ischemia or scar.  LVEF 54%. TTE (02/06/2018): Technically difficult study.  Normal LV size.  LVEF 65 to 70% with grade 1 diastolic dysfunction and  elevated filling pressure.  Mild to moderate aortic regurgitation.  Mild left atrial enlargement.  Normal RV size and function.  Mildly to moderately increased pulmonary artery pressure.  Recent CV Pertinent Labs: Lab Results  Component Value Date   CHOL 193 02/04/2021   HDL 70 02/04/2021   LDLCALC 108 (H) 02/04/2021   TRIG 82 02/04/2021   CHOLHDL 2.2 07/23/2019   INR 1.10 08/12/2013   K 4.9 05/10/2021   MG 1.7 05/10/2021   BUN 16 05/10/2021   CREATININE 0.64 05/10/2021    Past medical and surgical history were reviewed and updated in EPIC.  Current Meds  Medication Sig   ALPRAZolam (XANAX) 0.25 MG tablet Take 1 tablet (0.25 mg total) by mouth as needed for anxiety. To be taken if anxious prior to procedure   cyanocobalamin 100 MCG tablet Take 1 tablet by mouth daily.    diclofenac Sodium (VOLTAREN) 1 % GEL Apply 4 g topically 4 (four) times daily as needed.   docusate sodium (COLACE) 50 MG capsule Take 1 capsule (50 mg total) by mouth 2 (two) times daily.   glucose blood (ONE TOUCH ULTRA TEST) test strip USE 1 STRIP TO CHECK GLUCOSE ONCE DAILY   loratadine (CLARITIN) 10 MG tablet Take 1 tablet (10 mg total) by mouth daily.   metFORMIN (GLUCOPHAGE) 500 MG tablet TAKE 1 TABLET BY MOUTH TWICE DAILY WITH MEALS (LAST  TIME  FILLING)   mometasone (NASONEX) 50 MCG/ACT nasal spray Place 2 sprays into the nose daily.   montelukast (SINGULAIR) 10 MG tablet Take 1 tablet (10 mg total) by mouth at bedtime.   olmesartan (BENICAR) 20 MG tablet Take 1 tablet (20 mg total) by mouth at bedtime.   omeprazole (PRILOSEC) 20 MG capsule Take 1  capsule (20 mg total) by mouth daily.   [DISCONTINUED] amLODipine (NORVASC) 2.5 MG tablet Take 1 tablet (2.5 mg total) by mouth daily. Take only prior to procedure    Allergies: Nsaids and Lisinopril  Social History   Tobacco Use   Smoking status: Never   Smokeless tobacco: Never  Vaping Use   Vaping Use: Never used  Substance Use Topics   Alcohol use:  Not Currently   Drug use: Never    Family History  Problem Relation Age of Onset   Diabetes Mother    Cerebral aneurysm Father     Review of Systems: A 12-system review of systems was performed and was negative except as noted in the HPI.  --------------------------------------------------------------------------------------------------  Physical Exam: BP (!) 162/80   Pulse 73   Ht 5\' 3"  (1.6 m)   Wt 178 lb 3.2 oz (80.8 kg)   SpO2 97%   BMI 31.57 kg/m  Repeat BP: 162/74  General:  NAD. Neck: No JVD or HJR. Lungs: Clear to auscultation bilaterally without wheezes or crackles. Heart: Regular rate and rhythm with occasional extrasystoles and 2/6 systolic murmur. Abdomen: Soft, nontender, nondistended. Extremities: 1-2+ pretibial edema.  EKG: Normal sinus rhythm with occasional PVCs and left bundle branch block.  No significant change since prior tracing on 05/10/2021.  Lab Results  Component Value Date   WBC 5.4 04/13/2021   HGB 10.6 (L) 04/13/2021   HCT 33.3 (L) 04/13/2021   MCV 84.3 04/13/2021   PLT 215 04/13/2021    Lab Results  Component Value Date   NA 139 05/10/2021   K 4.9 05/10/2021   CL 103 05/10/2021   CO2 22 05/10/2021   BUN 16 05/10/2021   CREATININE 0.64 05/10/2021   GLUCOSE 119 (H) 05/10/2021   ALT 12 (L) 02/05/2018    Lab Results  Component Value Date   CHOL 193 02/04/2021   HDL 70 02/04/2021   LDLCALC 108 (H) 02/04/2021   TRIG 82 02/04/2021   CHOLHDL 2.2 07/23/2019    --------------------------------------------------------------------------------------------------  ASSESSMENT AND PLAN: Hypertension: Blood pressure poorly controlled today.  [name] is currently only on olmesartan, having stopped amlodipine since her last visit.  Given her lower extremity edema, I have recommended that we continue olmesartan 20 mg daily and add HCTZ 12.5 mg daily.  BMP should be checked when the patient returns in 1 month for follow-up.  Sodium  restriction was encouraged.  Valvular heart disease: [name] is asymptomatic but has 1-2+ edema on examination today.  Some of this may be dependent in nature, as she spends much of her time seated with her legs down.  I have encouraged her to elevate her legs if possible.  We will add HCTZ, as above.  Recent echo showed preserved LVEF with grade 1 diastolic dysfunction and mild mitral and trace aortic valve regurgitation.  PVCs, PSVT, and NSVT: EKG today again shows left bundle branch block with PVCs.  Given lack of symptoms, no further work-up/intervention is recommended at this time.  Follow-up: Return to clinic in 1 month with APP to reassess blood pressure.  BMP should be drawn at that time.    Darrold Span, MD 07/22/2021 3:00 PM

## 2021-07-23 ENCOUNTER — Encounter: Payer: Self-pay | Admitting: Internal Medicine

## 2021-07-23 DIAGNOSIS — I38 Endocarditis, valve unspecified: Secondary | ICD-10-CM | POA: Insufficient documentation

## 2021-08-05 DIAGNOSIS — E113393 Type 2 diabetes mellitus with moderate nonproliferative diabetic retinopathy without macular edema, bilateral: Secondary | ICD-10-CM | POA: Diagnosis not present

## 2021-08-06 ENCOUNTER — Telehealth: Payer: Self-pay

## 2021-08-06 DIAGNOSIS — H5213 Myopia, bilateral: Secondary | ICD-10-CM | POA: Diagnosis not present

## 2021-08-06 NOTE — Telephone Encounter (Signed)
Copied from CRM [id number]. Topic: Quick Communication - Appointment Cancellation >> Aug 06, 2021 12:13 PM Gwenlyn Fudge wrote: Patient called to cancel appointment scheduled for 08/18/21. Patient has not rescheduled their appointment.  Route to department's PEC pool.

## 2021-08-09 NOTE — Telephone Encounter (Signed)
Contacted patient - needed appointment to be by phone not in office. I changed appointment mode -srs

## 2021-08-09 NOTE — Telephone Encounter (Signed)
Looks like her appt is still on the schedule. Please contact to confirm and r/s if needed. Thank you

## 2021-08-11 ENCOUNTER — Ambulatory Visit
Admission: RE | Admit: 2021-08-11 | Discharge: 2021-08-11 | Disposition: A | Discharge status: Home / Self Care | Payer: Medicare Other | Source: Ambulatory Visit | Attending: Student in an Organized Health Care Education/Training Program | Admitting: Student in an Organized Health Care Education/Training Program

## 2021-08-11 ENCOUNTER — Other Ambulatory Visit: Payer: Self-pay

## 2021-08-11 ENCOUNTER — Ambulatory Visit (HOSPITAL_BASED_OUTPATIENT_CLINIC_OR_DEPARTMENT_OTHER): Payer: Medicare Other | Admitting: Student in an Organized Health Care Education/Training Program

## 2021-08-11 ENCOUNTER — Encounter: Payer: Self-pay | Admitting: Student in an Organized Health Care Education/Training Program

## 2021-08-11 DIAGNOSIS — T8484XS Pain due to internal orthopedic prosthetic devices, implants and grafts, sequela: Secondary | ICD-10-CM

## 2021-08-11 DIAGNOSIS — G894 Chronic pain syndrome: Secondary | ICD-10-CM | POA: Diagnosis not present

## 2021-08-11 DIAGNOSIS — Z96652 Presence of left artificial knee joint: Secondary | ICD-10-CM

## 2021-08-11 MED ORDER — DEXAMETHASONE SODIUM PHOSPHATE 10 MG/ML IJ SOLN
INTRAMUSCULAR | Status: AC
  Filled 2021-08-11: qty 1

## 2021-08-11 MED ORDER — LIDOCAINE HCL (PF) 2 % IJ SOLN
INTRAMUSCULAR | Status: AC
  Filled 2021-08-11: qty 10

## 2021-08-11 MED ORDER — ROPIVACAINE HCL 2 MG/ML IJ SOLN
9.0000 mL | Freq: Once | INTRAMUSCULAR | Status: AC
  Administered 2021-08-11: 20 mL via PERINEURAL

## 2021-08-11 MED ORDER — DEXAMETHASONE SODIUM PHOSPHATE 10 MG/ML IJ SOLN
10.0000 mg | Freq: Once | INTRAMUSCULAR | Status: AC
  Administered 2021-08-11: 10 mg

## 2021-08-11 MED ORDER — ROPIVACAINE HCL 2 MG/ML IJ SOLN
INTRAMUSCULAR | Status: AC
  Filled 2021-08-11: qty 20

## 2021-08-11 MED ORDER — LIDOCAINE HCL 2 % IJ SOLN
20.0000 mL | Freq: Once | INTRAMUSCULAR | Status: AC
  Administered 2021-08-11: 100 mg
  Filled 2021-08-11: qty 20

## 2021-08-11 NOTE — Patient Instructions (Signed)

## 2021-08-11 NOTE — Progress Notes (Signed)
PROVIDER NOTE: Information contained herein reflects review and annotations entered in association with encounter. Interpretation of such information and data should be left to medically-trained personnel. Information provided to patient can be located elsewhere in the medical record under "Patient Instructions". Document created using STT-dictation technology, any transcriptional errors that may result from process are unintentional.    Patient: [name]  Service Category: Procedure  Provider: Edward Jolly, MD  DOB: 01/26/1932  DOS: 08/11/2021  Location: [facility]  MRN: [id number]  Setting: Ambulatory - outpatient  Referring Provider: Edward Jolly, MD  Type: Established Patient  Specialty: Interventional Pain Management  PCP: Duanne Limerick, MD   Primary Reason for Visit: Interventional Pain Management Treatment. CC: Knee Pain (left)  Procedure:          Anesthesia, Analgesia, Anxiolysis:  Type: Genicular Nerves Block (Superolateral, Superomedial, and Inferomedial Genicular Nerves) #2  CPT: 64450      Primary Purpose: Diagnostic/Therapeutric Region: Lateral, Anterior, and Medial aspects of the knee joint, above and below the knee joint proper. Level: Superior and inferior to the knee joint. Target Area: For Genicular Nerve block(s), the targets are: the superolateral genicular nerve, located in the lateral distal portion of the femoral shaft as it curves to form the lateral epicondyle, in the region of the distal femoral metaphysis; the superomedial genicular nerve, located in the medial distal portion of the femoral shaft as it curves to form the medial epicondyle; and the inferomedial genicular nerve, located in the medial, proximal portion of the tibial shaft, as it curves to form the medial epicondyle, in the region of the proximal tibial metaphysis. Approach: Anterior, percutaneous, ipsilateral approach. Laterality: Left knee   Local Anesthetic: Lidocaine  1-2%  Position: Modified Fowler's position with pillows under the targeted knee(s).   Indications: 1. Pain due to total left knee replacement, sequela   2. Chronic pain syndrome    Pain Score: Pre-procedure: 3 /10 Post-procedure: 0-No pain/10   Pre-op H&P Assessment:  [name] is a 87 y.o. (year old), female patient, seen today for interventional treatment. She  has a past surgical history that includes Total knee arthroplasty (Left, 03/28/2011); Cervical disc surgery (12/28/2010); Total knee arthroplasty (Right, 08/19/2013); Joint replacement; Back surgery; Total knee arthroplasty (Right, 08/19/2013); and Cataract extraction (Bilateral, 03/2016). [name] has a current medication list which includes the following prescription(s): alprazolam, cyanocobalamin, diclofenac sodium, docusate sodium, glucose blood, hydrochlorothiazide, loratadine, metformin, mometasone, montelukast, olmesartan, and omeprazole. Her primarily concern today is the Knee Pain (left)  Initial Vital Signs:  Pulse/HCG Rate: 64  Temp: (!) 96.4 F (35.8 C) Resp: 16 BP: (!) 142/58 SpO2: 97 %  BMI: Estimated body mass index is 29.58 kg/m as calculated from the following:   Height as of this encounter: 5\' 3"  (1.6 m).   Weight as of this encounter: 167 lb (75.8 kg).  Risk Assessment: Allergies: Reviewed. She is allergic to nsaids and lisinopril.  Allergy Precautions: None required Coagulopathies: Reviewed. None identified.  Blood-thinner therapy: None at this time Active Infection(s): Reviewed. None identified. [name] is afebrile  Site Confirmation: [name] was asked to confirm the procedure and laterality before marking the site Procedure checklist: Completed Consent: Before the procedure and under the influence of no sedative(s), amnesic(s), or anxiolytics, the patient was informed of the treatment options, risks and possible complications. To fulfill our ethical and legal obligations, as recommended by  the American Medical Association's Code of Ethics, I have informed the patient of my clinical impression;  the nature and purpose of the treatment or procedure; the risks, benefits, and possible complications of the intervention; the alternatives, including doing nothing; the risk(s) and benefit(s) of the alternative treatment(s) or procedure(s); and the risk(s) and benefit(s) of doing nothing. The patient was provided information about the general risks and possible complications associated with the procedure. These may include, but are not limited to: failure to achieve desired goals, infection, bleeding, organ or nerve damage, allergic reactions, paralysis, and death. In addition, the patient was informed of those risks and complications associated to the procedure, such as failure to decrease pain; infection; bleeding; organ or nerve damage with subsequent damage to sensory, motor, and/or autonomic systems, resulting in permanent pain, numbness, and/or weakness of one or several areas of the body; allergic reactions; (i.e.: anaphylactic reaction); and/or death. Furthermore, the patient was informed of those risks and complications associated with the medications. These include, but are not limited to: allergic reactions (i.e.: anaphylactic or anaphylactoid reaction(s)); adrenal axis suppression; blood sugar elevation that in diabetics may result in ketoacidosis or comma; water retention that in patients with history of congestive heart failure may result in shortness of breath, pulmonary edema, and decompensation with resultant heart failure; weight gain; swelling or edema; medication-induced neural toxicity; particulate matter embolism and blood vessel occlusion with resultant organ, and/or nervous system infarction; and/or aseptic necrosis of one or more joints. Finally, the patient was informed that Medicine is not an exact science; therefore, there is also the possibility of unforeseen or unpredictable  risks and/or possible complications that may result in a catastrophic outcome. The patient indicated having understood very clearly. We have given the patient no guarantees and we have made no promises. Enough time was given to the patient to ask questions, all of which were answered to the patient's satisfaction. Ms. Woolbright has indicated that she wanted to continue with the procedure. Attestation: I, the ordering provider, attest that I have discussed with the patient the benefits, risks, side-effects, alternatives, likelihood of achieving goals, and potential problems during recovery for the procedure that I have provided informed consent. Date  Time: 08/11/2021 12:30 PM  Pre-Procedure Preparation:  Monitoring: As per clinic protocol. Respiration, ETCO2, SpO2, BP, heart rate and rhythm monitor placed and checked for adequate function Safety Precautions: Patient was assessed for positional comfort and pressure points before starting the procedure. Time-out: I initiated and conducted the "Time-out" before starting the procedure, as per protocol. The patient was asked to participate by confirming the accuracy of the "Time Out" information. Verification of the correct person, site, and procedure were performed and confirmed by me, the nursing staff, and the patient. "Time-out" conducted as per Joint Commission's Universal Protocol (UP.01.01.01). Time: 1311  Description of Procedure:          Area Prepped: Entire knee area, from mid-thigh to mid-shin, lateral, anterior, and medial aspects. DuraPrep (Iodine Povacrylex [0.7% available iodine] and Isopropyl Alcohol, 74% w/w) Safety Precautions: Aspiration looking for blood return was conducted prior to all injections. At no point did we inject any substances, as a needle was being advanced. No attempts were made at seeking any paresthesias. Safe injection practices and needle disposal techniques used. Medications properly checked for expiration dates. SDV  (single dose vial) medications used. Description of the Procedure: Protocol guidelines were followed. The patient was placed in position over the procedure table. The target area was identified and the area prepped in the usual manner. Skin & deeper tissues infiltrated with local anesthetic. Appropriate amount of time  allowed to pass for local anesthetics to take effect. The procedure needles were then advanced to the target area. Proper needle placement secured. Negative aspiration confirmed. Solution injected in intermittent fashion, asking for systemic symptoms every 0.5cc of injectate. The needles were then removed and the area cleansed, making sure to leave some of the prepping solution back to take advantage of its long term bactericidal properties.  Vitals:   08/11/21 1241 08/11/21 1311 08/11/21 1315 08/11/21 1318  BP: (!) 142/58 (!) 181/73 (!) 182/72 (!) 160/71  Pulse: 64 67 67 61  Resp: 16 19 18 17   Temp: (!) 96.4 F (35.8 C)     TempSrc: Temporal     SpO2: 97% 100% 100% 100%  Weight: 167 lb (75.8 kg)     Height: 5\' 3"  (1.6 m)       Start Time: 1311 hrs. End Time: 1317 hrs. Materials:  Needle(s) Type: Spinal Needle Gauge: 22G Length: 3.5-in Medication(s): Please see orders for medications and dosing details. 9 cc solution made of 8 cc of 0.2% ropivacaine, 1 cc of Decadron 10 mg/cc; 3 cc injected at each level above for the left genicular nerves. Imaging Guidance (Non-Spinal):          Type of Imaging Technique: Fluoroscopy Guidance (Non-Spinal) Indication(s): Assistance in needle guidance and placement for procedures requiring needle placement in or near specific anatomical locations not easily accessible without such assistance. Exposure Time: Please see nurses notes. Contrast: None used. Fluoroscopic Guidance: I was personally present during the use of fluoroscopy. "Tunnel Vision Technique" used to obtain the best possible view of the target area. Parallax error corrected  before commencing the procedure. "Direction-depth-direction" technique used to introduce the needle under continuous pulsed fluoroscopy. Once target was reached, antero-posterior, oblique, and lateral fluoroscopic projection used confirm needle placement in all planes. Images permanently stored in EMR. Interpretation: No contrast injected. I personally interpreted the imaging intraoperatively. Adequate needle placement confirmed in multiple planes. Permanent images saved into the patient's record.  Post-operative Assessment:  Post-procedure Vital Signs:  Pulse/HCG Rate: 61  Temp: (!) 96.4 F (35.8 C) Resp: 17 BP: (!) 160/71 SpO2: 100 %  EBL: None  Complications: No immediate post-treatment complications observed by team, or reported by patient.  Note: The patient tolerated the entire procedure well. A repeat set of vitals were taken after the procedure and the patient was kept under observation following institutional policy, for this type of procedure. Post-procedural neurological assessment was performed, showing return to baseline, prior to discharge. The patient was provided with post-procedure discharge instructions, including a section on how to identify potential problems. Should any problems arise concerning this procedure, the patient was given instructions to immediately contact , at any time, without hesitation. In any case, we plan to contact the patient by telephone for a follow-up status report regarding this interventional procedure.  Comments:  No additional relevant information.  Plan of Care  Orders:  Orders Placed This Encounter  Procedures   DG PAIN [facility] C-ARM 1-60 MIN NO REPORT    Intraoperative interpretation by procedural physician at [facility].    Standing Status:   Standing    Number of Occurrences:   1    Order Specific Question:   Reason for exam:    Answer:   Assistance in needle guidance and placement for procedures requiring needle placement  in or near specific anatomical locations not easily accessible without such assistance.     consider left knee genicular RFA.  Medications ordered for  procedure: Meds ordered this encounter  Medications   lidocaine (XYLOCAINE) 2 % (with pres) injection 400 mg   dexamethasone (DECADRON) injection 10 mg   ropivacaine (PF) 2 mg/mL (0.2%) (NAROPIN) injection 9 mL    Medications administered: We administered lidocaine, dexamethasone, and ropivacaine (PF) 2 mg/mL (0.2%).  See the medical record for exact dosing, route, and time of administration.  Follow-up plan:   Return in about 5 weeks (around 09/15/2021) for Post Procedure Evaluation, virtual.      Left knee genicular NB 06/30/21, 08/11/21  Recent Visits Date Type Provider Dept  07/20/21 Telemedicine Edward Jolly, MD [facility]  06/30/21 Procedure visit Edward Jolly, MD [facility]  06/17/21 Office Visit Edward Jolly, MD [facility]  Showing recent visits within past 90 days and meeting all other requirements Today's Visits Date Type Provider Dept  08/11/21 Procedure visit Edward Jolly, MD [facility]  Showing today's visits and meeting all other requirements Future Appointments Date Type Provider Dept  09/15/21 Appointment Edward Jolly, MD [facility]  Showing future appointments within next 90 days and meeting all other requirements Disposition: Discharge home  Discharge (Date  Time): 08/11/2021; 1330 hrs.   Primary Care Physician: Duanne Limerick, MD Location: [facility] Note by: Edward Jolly, MD Date: 08/11/2021; Time: 2:02 PM  Disclaimer:  Medicine is not an exact science. The only guarantee in medicine is that nothing is guaranteed. It is important to note that the decision to proceed with this intervention was based on the information collected from the patient. The Data and conclusions were drawn from the patient's questionnaire, the  interview, and the physical examination. Because the information was provided in large part by the patient, it cannot be guaranteed that it has not been purposely or unconsciously manipulated. Every effort has been made to obtain as much relevant data as possible for this evaluation. It is important to note that the conclusions that lead to this procedure are derived in large part from the available data. Always take into account that the treatment will also be dependent on availability of resources and existing treatment guidelines, considered by other Pain Management Practitioners as being common knowledge and practice, at the time of the intervention. For Medico-Legal purposes, it is also important to point out that variation in procedural techniques and pharmacological choices are the acceptable norm. The indications, contraindications, technique, and results of the above procedure should only be interpreted and judged by a Board-Certified Interventional Pain Specialist with extensive familiarity and expertise in the same exact procedure and technique.

## 2021-08-11 NOTE — Progress Notes (Signed)
Safety precautions to be maintained throughout the outpatient stay will include: orient to surroundings, keep bed in low position, maintain call bell within reach at all times, provide assistance with transfer out of bed and ambulation.  

## 2021-08-12 ENCOUNTER — Telehealth: Payer: Self-pay

## 2021-08-12 NOTE — Telephone Encounter (Signed)
Post procedure phone call.  Patient states she is doing well.  

## 2021-08-18 ENCOUNTER — Ambulatory Visit (INDEPENDENT_AMBULATORY_CARE_PROVIDER_SITE_OTHER): Payer: Medicare Other

## 2021-08-18 DIAGNOSIS — Z0000 Encounter for general adult medical examination without abnormal findings: Secondary | ICD-10-CM | POA: Diagnosis not present

## 2021-08-18 NOTE — Patient Instructions (Signed)
[name] , Thank you for taking time to come for your Medicare Wellness Visit. I appreciate your ongoing commitment to your health goals. Please review the following plan we discussed and let me know if I can assist you in the future.   Screening recommendations/referrals: Colonoscopy: no longer required Mammogram: no longer required Bone Density: no longer required Recommended yearly ophthalmology/optometry visit for glaucoma screening and checkup Recommended yearly dental visit for hygiene and checkup  Vaccinations: Influenza vaccine: due Pneumococcal vaccine: done 09/24/15 Tdap vaccine: done 05/10/16 Shingles vaccine: Shingrix discussed. Please contact your pharmacy for coverage information.  Covid-19:done 01/29/20 & 02/19/20; please bring a copy of your vaccine record for booster information   Advanced directives: Advance directive discussed with you today. Even though you declined this today please call our office should you change your mind and we can give you the proper paperwork for you to fill out.   Conditions/risks identified: Recommend continuing fall prevention in the home  Next appointment: Follow up in one year for your annual wellness visit    Preventive Care 65 Years and Older, Female Preventive care refers to lifestyle choices and visits with your health care provider that can promote health and wellness. What does preventive care include? A yearly physical exam. This is also called an annual well check. Dental exams once or twice a year. Routine eye exams. Ask your health care provider how often you should have your eyes checked. Personal lifestyle choices, including: Daily care of your teeth and gums. Regular physical activity. Eating a healthy diet. Avoiding tobacco and drug use. Limiting alcohol use. Practicing safe sex. Taking low-dose aspirin every day. Taking vitamin and mineral supplements as recommended by your health care provider. What happens during  an annual well check? The services and screenings done by your health care provider during your annual well check will depend on your age, overall health, lifestyle risk factors, and family history of disease. Counseling  Your health care provider may ask you questions about your: Alcohol use. Tobacco use. Drug use. Emotional well-being. Home and relationship well-being. Sexual activity. Eating habits. History of falls. Memory and ability to understand (cognition). Work and work Astronomer. Reproductive health. Screening  You may have the following tests or measurements: Height, weight, and BMI. Blood pressure. Lipid and cholesterol levels. These may be checked every 5 years, or more frequently if you are over 53 years old. Skin check. Lung cancer screening. You may have this screening every year starting at age 92 if you have a 30-pack-year history of smoking and currently smoke or have quit within the past 15 years. Fecal occult blood test (FOBT) of the stool. You may have this test every year starting at age 16. Flexible sigmoidoscopy or colonoscopy. You may have a sigmoidoscopy every 5 years or a colonoscopy every 10 years starting at age 82. Hepatitis C blood test. Hepatitis B blood test. Sexually transmitted disease (STD) testing. Diabetes screening. This is done by checking your blood sugar (glucose) after you have not eaten for a while (fasting). You may have this done every 1-3 years. Bone density scan. This is done to screen for osteoporosis. You may have this done starting at age 50. Mammogram. This may be done every 1-2 years. Talk to your health care provider about how often you should have regular mammograms. Talk with your health care provider about your test results, treatment options, and if necessary, the need for more tests. Vaccines  Your health care provider may recommend certain vaccines,  such as: Influenza vaccine. This is recommended every year. Tetanus,  diphtheria, and acellular pertussis (Tdap, Td) vaccine. You may need a Td booster every 10 years. Zoster vaccine. You may need this after age 49. Pneumococcal 13-valent conjugate (PCV13) vaccine. One dose is recommended after age 29. Pneumococcal polysaccharide (PPSV23) vaccine. One dose is recommended after age 77. Talk to your health care provider about which screenings and vaccines you need and how often you need them. This information is not intended to replace advice given to you by your health care provider. Make sure you discuss any questions you have with your health care provider. Document Released: 12/04/2015 Document Revised: 07/27/2016 Document Reviewed: 09/08/2015 Elsevier Interactive Patient Education  2017 [place] Prevention in the Home Falls can cause injuries. They can happen to people of all ages. There are many things you can do to make your home safe and to help prevent falls. What can I do on the outside of my home? Regularly fix the edges of walkways and driveways and fix any cracks. Remove anything that might make you trip as you walk through a door, such as a raised step or threshold. Trim any bushes or trees on the path to your home. Use bright outdoor lighting. Clear any walking paths of anything that might make someone trip, such as rocks or tools. Regularly check to see if handrails are loose or broken. Make sure that both sides of any steps have handrails. Any raised decks and porches should have guardrails on the edges. Have any leaves, snow, or ice cleared regularly. Use sand or salt on walking paths during winter. Clean up any spills in your garage right away. This includes oil or grease spills. What can I do in the bathroom? Use night lights. Install grab bars by the toilet and in the tub and shower. Do not use towel bars as grab bars. Use non-skid mats or decals in the tub or shower. If you need to sit down in the shower, use a plastic,  non-slip stool. Keep the floor dry. Clean up any water that spills on the floor as soon as it happens. Remove soap buildup in the tub or shower regularly. Attach bath mats securely with double-sided non-slip rug tape. Do not have throw rugs and other things on the floor that can make you trip. What can I do in the bedroom? Use night lights. Make sure that you have a light by your bed that is easy to reach. Do not use any sheets or blankets that are too big for your bed. They should not hang down onto the floor. Have a firm chair that has side arms. You can use this for support while you get dressed. Do not have throw rugs and other things on the floor that can make you trip. What can I do in the kitchen? Clean up any spills right away. Avoid walking on wet floors. Keep items that you use a lot in easy-to-reach places. If you need to reach something above you, use a strong step stool that has a grab bar. Keep electrical cords out of the way. Do not use floor polish or wax that makes floors slippery. If you must use wax, use non-skid floor wax. Do not have throw rugs and other things on the floor that can make you trip. What can I do with my stairs? Do not leave any items on the stairs. Make sure that there are handrails on both sides of the stairs and  use them. Fix handrails that are broken or loose. Make sure that handrails are as long as the stairways. Check any carpeting to make sure that it is firmly attached to the stairs. Fix any carpet that is loose or worn. Avoid having throw rugs at the top or bottom of the stairs. If you do have throw rugs, attach them to the floor with carpet tape. Make sure that you have a light switch at the top of the stairs and the bottom of the stairs. If you do not have them, ask someone to add them for you. What else can I do to help prevent falls? Wear shoes that: Do not have high heels. Have rubber bottoms. Are comfortable and fit you well. Are closed  at the toe. Do not wear sandals. If you use a stepladder: Make sure that it is fully opened. Do not climb a closed stepladder. Make sure that both sides of the stepladder are locked into place. Ask someone to hold it for you, if possible. Clearly mark and make sure that you can see: Any grab bars or handrails. First and last steps. Where the edge of each step is. Use tools that help you move around (mobility aids) if they are needed. These include: Canes. Walkers. Scooters. Crutches. Turn on the lights when you go into a dark area. Replace any light bulbs as soon as they burn out. Set up your furniture so you have a clear path. Avoid moving your furniture around. If any of your floors are uneven, fix them. If there are any pets around you, be aware of where they are. Review your medicines with your doctor. Some medicines can make you feel dizzy. This can increase your chance of falling. Ask your doctor what other things that you can do to help prevent falls. This information is not intended to replace advice given to you by your health care provider. Make sure you discuss any questions you have with your health care provider. Document Released: 09/03/2009 Document Revised: 04/14/2016 Document Reviewed: 12/12/2014 Elsevier Interactive Patient Education  2017 [employer].

## 2021-08-18 NOTE — Progress Notes (Signed)
Subjective:   [name] is a 87 y.o. female who presents for Medicare Annual (Subsequent) preventive examination.  Virtual Visit via Telephone Note  I connected with  [name] on 08/18/21 at 10:40 AM EDT by telephone and verified that I am speaking with the correct person using two identifiers.  Location: Patient: home Provider: [facility] Persons participating in the virtual visit: patient/Nurse Health Advisor   I discussed the limitations, risks, security and privacy concerns of performing an evaluation and management service by telephone and the availability of in person appointments. The patient expressed understanding and agreed to proceed.  Interactive audio and video telecommunications were attempted between this nurse and patient, however failed, due to patient having technical difficulties OR patient did not have access to video capability.  We continued and completed visit with audio only.  Some vital signs may be absent or patient reported.   Reather Littler, LPN   Review of Systems     Cardiac Risk Factors include: advanced age (>49men, >53 women);diabetes mellitus;dyslipidemia;hypertension;sedentary lifestyle     Objective:    There were no vitals filed for this visit. There is no height or weight on file to calculate BMI.  Advanced Directives 08/18/2021 06/30/2021 04/13/2021 04/12/2021 02/05/2018 02/05/2018 08/19/2013  Does Patient Have a Medical Advance Directive? No Yes No No No No Patient does not have advance directive;Patient would not like information  Type of Advance Directive - Healthcare Power of Attorney - - - - -  Does patient want to make changes to medical advance directive? - - No - Patient declined - - - -  Would patient like information on creating a medical advance directive? No - Patient declined - No - Patient declined No - Patient declined No - Patient declined No - Patient declined -  Pre-existing out of facility DNR order (yellow form or pink  MOST form) - - - - - - -    Current Medications (verified) Outpatient Encounter Medications as of 08/18/2021  Medication Sig   ALPRAZolam (XANAX) 0.25 MG tablet Take 1 tablet (0.25 mg total) by mouth as needed for anxiety. To be taken if anxious prior to procedure   cyanocobalamin 100 MCG tablet Take 1 tablet by mouth daily.    diclofenac Sodium (VOLTAREN) 1 % GEL Apply 4 g topically 4 (four) times daily as needed.   docusate sodium (COLACE) 50 MG capsule Take 1 capsule (50 mg total) by mouth 2 (two) times daily.   glucose blood (ONE TOUCH ULTRA TEST) test strip USE 1 STRIP TO CHECK GLUCOSE ONCE DAILY   hydrochlorothiazide (MICROZIDE) 12.5 MG capsule Take 1 capsule (12.5 mg total) by mouth daily.   loratadine (CLARITIN) 10 MG tablet Take 1 tablet (10 mg total) by mouth daily.   metFORMIN (GLUCOPHAGE) 500 MG tablet TAKE 1 TABLET BY MOUTH TWICE DAILY WITH MEALS (LAST  TIME  FILLING)   mometasone (NASONEX) 50 MCG/ACT nasal spray Place 2 sprays into the nose daily.   montelukast (SINGULAIR) 10 MG tablet Take 1 tablet (10 mg total) by mouth at bedtime.   olmesartan (BENICAR) 20 MG tablet Take 1 tablet (20 mg total) by mouth at bedtime.   omeprazole (PRILOSEC) 20 MG capsule Take 1 capsule (20 mg total) by mouth daily.   No facility-administered encounter medications on file as of 08/18/2021.    Allergies (verified) Nsaids and Lisinopril   History: Past Medical History:  Diagnosis Date   GERD (gastroesophageal reflux disease)    Hypertension  Right knee DJD    Type II diabetes mellitus (HCC)    Past Surgical History:  Procedure Laterality Date   BACK SURGERY     CATARACT EXTRACTION Bilateral 03/2016   CERVICAL DISC SURGERY  12/28/2010   "bulging disc; went in on the left side of my neck" (08/19/2013)   JOINT REPLACEMENT     TOTAL KNEE ARTHROPLASTY Left 03/28/2011   Dr Thurston Hole   TOTAL KNEE ARTHROPLASTY Right 08/19/2013   TOTAL KNEE ARTHROPLASTY Right 08/19/2013   Procedure: TOTAL KNEE  ARTHROPLASTY;  Surgeon: Nilda Simmer, MD;  Location: [facility] OR;  Service: Orthopedics;  Laterality: Right;   Family History  Problem Relation Age of Onset   Diabetes Mother    Cerebral aneurysm Father    Social History   Socioeconomic History   Marital status: Widowed    Spouse name: Not on file   Number of children: 3   Years of education: Not on file   Highest education level: Not on file  Occupational History   Occupation: Retired  Tobacco Use   Smoking status: Never   Smokeless tobacco: Never   Tobacco comments:    Smoking cessation materials not required  Vaping Use   Vaping Use: Never used  Substance and Sexual Activity   Alcohol use: Not Currently   Drug use: Never   Sexual activity: Not Currently  Other Topics Concern   Not on file  Social History Narrative   Pt lives alone; does not drive   Social Determinants of Health   Financial Resource Strain: Low Risk    Difficulty of Paying Living Expenses: Not hard at all  Food Insecurity: No Food Insecurity   Worried About Programme researcher, broadcasting/film/video in the Last Year: Never true   Ran Out of Food in the Last Year: Never true  Transportation Needs: No Transportation Needs   Lack of Transportation (Medical): No   Lack of Transportation (Non-Medical): No  Physical Activity: Inactive   Days of Exercise per Week: 0 days   Minutes of Exercise per Session: 0 min  Stress: No Stress Concern Present   Feeling of Stress : Not at all  Social Connections: Moderately Isolated   Frequency of Communication with Friends and Family: More than three times a week   Frequency of Social Gatherings with Friends and Family: More than three times a week   Attends Religious Services: More than 4 times per year   Active Member of [employer] or Organizations: No   Attends Banker Meetings: Never   Marital Status: Widowed    Tobacco Counseling Counseling given: No Tobacco comments: Smoking cessation materials not required   Clinical  Intake:  Pre-visit preparation completed: Yes  Pain : No/denies pain     Diabetes: Yes CBG done?: No Did pt. bring in CBG monitor from home?: No  How often do you need to have someone help you when you read instructions, pamphlets, or other written materials from your doctor or pharmacy?: 1 - Never  Nutrition Risk Assessment:  Has the patient had any N/V/D within the last 2 months?  No  Does the patient have any non-healing wounds?  No Has the patient had any unintentional weight loss or weight gain?  No   Diabetes:  Is the patient diabetic?  Yes  If diabetic, was a CBG obtained today?  No  Did the patient bring in their glucometer from home?  No  How often do you monitor your CBG's? daily.   Financial  Strains and Diabetes Management:  Are you having any financial strains with the device, your supplies or your medication? No .  Does the patient want to be seen by Chronic Care Management for management of their diabetes?  No  Would the patient like to be referred to a Nutritionist or for Diabetic Management?  No   Diabetic Exams:  Diabetic Eye Exam: Completed per patient; need records from Dr. Alvester Morin.   Diabetic Foot Exam: Completed 02/04/21.   Interpreter Needed?: No  Information entered by :: Reather Littler LPN   Activities of Daily Living In your present state of health, do you have any difficulty performing the following activities: 08/18/2021 04/13/2021  Hearing? N N  Vision? N N  Difficulty concentrating or making decisions? N N  Walking or climbing stairs? Y Y  Dressing or bathing? N N  Doing errands, shopping? Y N  Comment does not drive Chief Operating Officer and eating ? N -  Using the Toilet? N -  In the past six months, have you accidently leaked urine? N -  Do you have problems with loss of bowel control? N -  Managing your Medications? N -  Managing your Finances? N -  Housekeeping or managing your Housekeeping? N -  Some recent data might be hidden     Patient Care Team: Duanne Limerick, MD as PCP - General (Family Medicine) End, Cristal Deer, MD as PCP - Cardiology (Cardiology)  Indicate any recent Medical Services you may have received from other than [facility] providers in the past year (date may be approximate).     Assessment:   This is a routine wellness examination for Sunday.  Hearing/Vision screen Hearing Screening - Comments:: Pt declines hearing difficulty Vision Screening - Comments:: Annual vision screenings done by Dr. Alvester Morin  Dietary issues and exercise activities discussed: Current Exercise Habits: The patient does not participate in regular exercise at present, Exercise limited by: orthopedic condition(s)   Goals Addressed   None    Depression Screen PHQ 2/9 Scores 08/18/2021 06/30/2021 05/20/2021 04/23/2021 03/12/2021 08/17/2020 03/18/2020  PHQ - 2 Score 0 0 0 0 0 0 0  PHQ- 9 Score - - 0 0 0 - 0    Fall Risk Fall Risk  08/18/2021 08/11/2021 06/30/2021 06/17/2021 03/12/2021  Falls in the past year? 0 0 0 0 0  Number falls in past yr: 0 - - - 0  Injury with Fall? 0 - - - 0  Risk for fall due to : No Fall Risks - - - No Fall Risks;Orthopedic patient;Impaired mobility  Follow up Falls prevention discussed - - - Falls evaluation completed    FALL RISK PREVENTION PERTAINING TO THE HOME:  Any stairs in or around the home? No  If so, are there any without handrails? No  Home free of loose throw rugs in walkways, pet beds, electrical cords, etc? Yes  Adequate lighting in your home to reduce risk of falls? Yes   ASSISTIVE DEVICES UTILIZED TO PREVENT FALLS:  Life alert? No  Use of a cane, walker or w/c? Yes  Grab bars in the bathroom? No  Shower chair or bench in shower? No  Elevated toilet seat or a handicapped toilet? Yes   TIMED UP AND GO:  Was the test performed? No . Telephonic visit.   Cognitive Function: Normal cognitive status assessed by direct observation by this Nurse Health Advisor. No abnormalities found.        6CIT Screen 08/17/2020  What Year? 0  points  What month? 0 points  What time? 0 points  Count back from 20 0 points  Months in reverse 4 points  Repeat phrase 10 points  Total Score 14    Immunizations Immunization History  Administered Date(s) Administered   Fluad Quad(high Dose 65+) 07/23/2019, 08/17/2020   Influenza, High Dose Seasonal PF 10/01/2018   PFIZER(Purple Top)SARS-COV-2 Vaccination 01/29/2020, 02/19/2020   Pneumococcal Conjugate-13 09/24/2015   Pneumococcal Polysaccharide-23 03/28/2013   Pneumococcal-Unspecified 03/28/2013   Tdap 05/10/2016    TDAP status: Up to date  Flu Vaccine status: Due, Education has been provided regarding the importance of this vaccine. Advised may receive this vaccine at local pharmacy or Health Dept. Aware to provide a copy of the vaccination record if obtained from local pharmacy or Health Dept. Verbalized acceptance and understanding.  Pneumococcal vaccine status: Up to date  Covid-19 vaccine status: Completed vaccines  Qualifies for Shingles Vaccine? Yes   Zostavax completed No   Shingrix Completed?: No.    Education has been provided regarding the importance of this vaccine. Patient has been advised to call insurance company to determine out of pocket expense if they have not yet received this vaccine. Advised may also receive vaccine at local pharmacy or Health Dept. Verbalized acceptance and understanding.  Screening Tests Health Maintenance  Topic Date Due   Zoster Vaccines- Shingrix (1 of 2) Never done   COVID-19 Vaccine (3 - Pfizer risk series) 03/18/2020   OPHTHALMOLOGY EXAM  07/04/2020   INFLUENZA VACCINE  06/21/2021   HEMOGLOBIN A1C  08/07/2021   DEXA SCAN  04/23/2022 (Originally 01/18/1999)   FOOT EXAM  02/04/2022   TETANUS/TDAP  05/10/2026   HPV VACCINES  Aged Out    Health Maintenance  Health Maintenance Due  Topic Date Due   Zoster Vaccines- Shingrix (1 of 2) Never done   COVID-19 Vaccine (3 - Pfizer  risk series) 03/18/2020   OPHTHALMOLOGY EXAM  07/04/2020   INFLUENZA VACCINE  06/21/2021   HEMOGLOBIN A1C  08/07/2021    Colorectal cancer screening: No longer required.   Mammogram status: No longer required due to age.  Bone density status: no longer required due to age.   Lung Cancer Screening: (Low Dose CT Chest recommended if Age 45-80 years, 30 pack-year currently smoking OR have quit w/in 15years.) does not qualify.   Additional Screening:  Hepatitis C Screening: does not qualify.   Vision Screening: Recommended annual ophthalmology exams for early detection of glaucoma and other disorders of the eye. Is the patient up to date with their annual eye exam?  Yes  Who is the provider or what is the name of the office in which the patient attends annual eye exams? Dr. Alvester Morin.   Dental Screening: Recommended annual dental exams for proper oral hygiene  Community Resource Referral / Chronic Care Management: CRR required this visit?  No   CCM required this visit?  No      Plan:     I have personally reviewed and noted the following in the patient's chart:   Medical and social history Use of alcohol, tobacco or illicit drugs  Current medications and supplements including opioid prescriptions.  Functional ability and status Nutritional status Physical activity Advanced directives List of other physicians Hospitalizations, surgeries, and ER visits in previous 12 months Vitals Screenings to include cognitive, depression, and falls Referrals and appointments  In addition, I have reviewed and discussed with patient certain preventive protocols, quality metrics, and best practice recommendations. A written personalized care plan  for preventive services as well as general preventive health recommendations were provided to patient.     Reather Littler, LPN   [id number]   Nurse Notes: none

## 2021-08-25 ENCOUNTER — Encounter: Payer: Self-pay | Admitting: Physician Assistant

## 2021-08-25 ENCOUNTER — Other Ambulatory Visit: Payer: Self-pay

## 2021-08-25 ENCOUNTER — Ambulatory Visit (INDEPENDENT_AMBULATORY_CARE_PROVIDER_SITE_OTHER): Payer: Medicare Other | Admitting: Physician Assistant

## 2021-08-25 VITALS — BP 156/78 | HR 64 | Ht 63.0 in | Wt 176.4 lb

## 2021-08-25 DIAGNOSIS — I10 Essential (primary) hypertension: Secondary | ICD-10-CM

## 2021-08-25 DIAGNOSIS — I493 Ventricular premature depolarization: Secondary | ICD-10-CM

## 2021-08-25 DIAGNOSIS — I38 Endocarditis, valve unspecified: Secondary | ICD-10-CM | POA: Diagnosis not present

## 2021-08-25 DIAGNOSIS — R600 Localized edema: Secondary | ICD-10-CM | POA: Diagnosis not present

## 2021-08-25 DIAGNOSIS — I340 Nonrheumatic mitral (valve) insufficiency: Secondary | ICD-10-CM

## 2021-08-25 DIAGNOSIS — I471 Supraventricular tachycardia: Secondary | ICD-10-CM | POA: Diagnosis not present

## 2021-08-25 DIAGNOSIS — I351 Nonrheumatic aortic (valve) insufficiency: Secondary | ICD-10-CM | POA: Diagnosis not present

## 2021-08-25 NOTE — Patient Instructions (Signed)
Medication Instructions:  Your physician recommends that you continue on your current medications as directed. Please refer to the Current Medication list given to you today.  *If you need a refill on your cardiac medications before your next appointment, please call your pharmacy*   Lab Work:  BMP to be drawn today.  If you have labs (blood work) drawn today and your tests are completely normal, you will receive your results only by: MyChart Message (if you have MyChart) OR A paper copy in the mail If you have any lab test that is abnormal or we need to change your treatment, we will call you to review the results.   Testing/Procedures: None ordered   Follow-Up: At [facility], you and your health needs are our priority.  As part of our continuing mission to provide you with exceptional heart care, we have created designated Provider Care Teams.  These Care Teams include your primary Cardiologist (physician) and Advanced Practice Providers (APPs -  Physician Assistants and Nurse Practitioners) who all work together to provide you with the care you need, when you need it.  We recommend signing up for the patient portal called "MyChart".  Sign up information is provided on this After Visit Summary.  MyChart is used to connect with patients for Virtual Visits (Telemedicine).  Patients are able to view lab/test results, encounter notes, upcoming appointments, etc.  Non-urgent messages can be sent to your provider as well.   To learn more about what you can do with MyChart, go to [email or web address].    Your next appointment:   1 month(s)  The format for your next appointment:   In Person  Provider:   You may see Yvonne Kendall, MD or one of the following Advanced Practice Providers on your designated Care Team:   Nicolasa Ducking, NP Eula Listen, PA-C Marisue Ivan, PA-C Cadence Fransico Michael, [place]   Other Instructions

## 2021-08-25 NOTE — Progress Notes (Signed)
Office Visit    Patient Name: [name] Date of Encounter: 08/25/2021  PCP:  Duanne Limerick, MD   [facility]  Cardiologist:  Yvonne Kendall, MD  Advanced Practice Provider:  No care team member to display Electrophysiologist:  None :[phone]}   Chief Complaint    No chief complaint on file.   87 y.o. female  with a hx of hypertension, aortic regurgitation, DM2, bradycardia, PVCs, NSVT, paroxysmal SVT, and who is being seen today for hospital follow-up and review of cardiac monitoring.   Past Medical History    Past Medical History:  Diagnosis Date   GERD (gastroesophageal reflux disease)    Hypertension    Right knee DJD    Type II diabetes mellitus (HCC)    Past Surgical History:  Procedure Laterality Date   BACK SURGERY     CATARACT EXTRACTION Bilateral 03/2016   CERVICAL DISC SURGERY  12/28/2010   "bulging disc; went in on the left side of my neck" (08/19/2013)   JOINT REPLACEMENT     TOTAL KNEE ARTHROPLASTY Left 03/28/2011   Dr Thurston Hole   TOTAL KNEE ARTHROPLASTY Right 08/19/2013   TOTAL KNEE ARTHROPLASTY Right 08/19/2013   Procedure: TOTAL KNEE ARTHROPLASTY;  Surgeon: Nilda Simmer, MD;  Location: [facility] OR;  Service: Orthopedics;  Laterality: Right;    Allergies  Allergies  Allergen Reactions   Nsaids    Lisinopril     Cough    History of Present Illness    [name] is a 87 y.o. female with PMH as above. [name] is an 87 year old female with PMH as above.  Previous 01/2018 echo showed EF 65 to 70%, G1 DD, mild to moderate MR, mild LAE, PASP 35 to 40 mmHg plus central venous/RAP.  Previous cardiac monitoring 01/2018 showed rate 43 to 152 bpm with occasional PACs and PVCs.  Brief episodes of SVT and nonsustained VT noted.  No sustained arrhythmias or prolonged pauses.   She is followed by [facility] cardiology with last office visit 10/23/2020.  No CP/SOB.  She was not checking her BP at home.  She had not fallen recently and was  tolerating her medications well.  It was noted she should continue to avoid rate controlling agents due to bradycardia.  Due to moderate AR seen on her echo in 2019 with pt asx other than dependent edema (graded 1+ on exam), repeat echo was recommended before follow-up 10/2021.  She was instructed to call the office if any new symptoms develop.  BP was well controlled without any medication changes. EKG showed NSR, chronic LBBB, and PVCs with patient asymptomatic and further work-up deferred other than follow-up echo before the next visit.   She was recently seen in the outpatient setting and her amlodipine was discontinued.   On 03/22/2021, she was seen by her PCP with notes reporting dizziness when moving from sitting to standing.  BP was noted to be 120/70.  Her amlodipine 5 mg daily was discontinued.  Increased hydration was recommended, though notes indicate tea and soft drinks were likely the beverage of choice to be increased by the patient.  It is unclear if water was encouraged over the and soft drinks or if tea. No documented precautions provided regarding influence caffeine on hydration or of soft drinks on any underlying GERD.   On 04/12/2021, she presented to [facility] ED after becoming lightheaded and nauseated while standing that day.  Her symptoms only lasted for approximately 1 minute  and resolved shortly after returning to the seated position.  She reported some epigastric pain and 1 episode of emesis that day.  In the ED, initial SBP 150s to 170s. SB noted at 55 bpm and known history of asymptomatic bradycardia.  She was mildly febrile with temperature 99.6 F.  UA with rare bacteria without leukocytes.  Orthostatics negative.   telemetry showed sinus bradycardia, PVCs, and a single atrial run lasting 14 seconds with a rate of approximately 100 bpm.  EKG showed Sinus bradycardia, 58 bpm, occasional PVCs, QRS 116 with known LBBB, PR interval prolonged at 194 ms, poor R wave progression noted in the  inferior and precordial leads, no acute ST/T changes.  Echo was reassuring with normal LVEF and only mild regurgitant valvular heart disease (EF 60 to 65%, G1 DD, mild MR and AR).  Orthostatic BP vitals and elevated BP suggested that dehydration and overmedication were not causing her lightheadedness.  It was thought that PVCs and bradycardia could be contributing to her symptoms.  14-day event monitor was recommended at discharge.  Ambulatory cardiac monitoring with Zio placed at discharge showed predominantly NSR with minimum heart rate 47 bpm, maximum 179 bpm and average heart rate 64 bpm.  Bundle branch block and IVCD was present.  106 supraventricular tachycardia runs occurred, the fastest of which lasted 4 beats with maximum rate 179 bpm and the longest 4 minutes 11 seconds with average rate 111 bpm.  Isolated PACs were frequent (8.6%, 111,213), atrial couplets were occasional (1.4%, 9304), and atrial triplets were rare (less than 1%, 3465).  Isolated PVCs were rare and less than 1%.    She was seen 04/2021 with BP elevated and olmesartan restarted.  When seen at follow-up 07/2021, she had discontinued amlodipine.  She noted occasional headaches.  HCTZ 12.5 mg daily was added with recommendation for BMET in 1 month.  Sodium restriction encouraged.  Edema was noted and thought to be dependent in nature.  Echo was noted to show LVEF with G1 DD and mild MR/trace AR.  EKG showed LBBB and PVCs, though given the lack of symptoms, no further intervention recommended.  Today, 08/25/2021, she returns to clinic and notes that she is taking all medications as prescribed with improved symptoms.  She denies further headaches.  She reports her swelling improved.  She is trying to stay away from sodium.  She is cutting back on chips, which is her favorite salty food.  She reports home SBP 1 30-1 60.  DBP is not over 90s.  She overall feels that she is doing well from a cardiac standpoint.  Home Medications   Current  Outpatient Medications  Medication Instructions   ALPRAZolam (XANAX) 0.25 mg, Oral, As needed, To be taken if anxious prior to procedure   cyanocobalamin 100 MCG tablet 1 tablet, Oral, Daily   diclofenac Sodium (VOLTAREN) 4 g, Topical, 4 times daily PRN   docusate sodium (COLACE) 50 mg, Oral, 2 times daily   glucose blood (ONE TOUCH ULTRA TEST) test strip USE 1 STRIP TO CHECK GLUCOSE ONCE DAILY   hydrochlorothiazide (MICROZIDE) 12.5 mg, Oral, Daily   loratadine (CLARITIN) 10 mg, Oral, Daily   metFORMIN (GLUCOPHAGE) 500 MG tablet TAKE 1 TABLET BY MOUTH TWICE DAILY WITH MEALS (LAST  TIME  FILLING)   mometasone (NASONEX) 50 MCG/ACT nasal spray 2 sprays, Nasal, Daily   montelukast (SINGULAIR) 10 mg, Oral, Daily at bedtime   olmesartan (BENICAR) 20 mg, Oral, Daily at bedtime   omeprazole (PRILOSEC) 20 mg,  Oral, Daily     Review of Systems    She denies chest pain, palpitations, dyspnea, pnd, orthopnea, n, v, dizziness, syncope, edema, weight gain, or early satiety.  She reports improvement of previous headache and edema.  All other systems reviewed and are otherwise negative except as noted above.  Physical Exam    VS:  BP (!) 156/78   Pulse 64   Ht 5\' 3"  (1.6 m)   Wt 176 lb 6.4 oz (80 kg)   BMI 31.25 kg/m  , BMI Body mass index is 31.25 kg/m. GEN: Elderly female, seated in wheelchair next to the door.  Joined today by her daughter. HEENT: normal. Neck: Supple, no JVD, carotid bruits, or masses. Cardiac: RRR with occasional extrasystole, 2/6 systolic murmur.  No, rubs, or gallops. No clubbing, cyanosis, moderate bilateral edema.  Radials/DP/PT 2+ and equal bilaterally.  Respiratory: Clear to auscultation bilaterally. GI: Soft, nontender, nondistended, BS + x 4. MS: no deformity or atrophy. Skin: warm and dry, no rash. Neuro:  Strength and sensation are intact. Psych: Normal affect.  Accessory Clinical Findings    ECG personally reviewed by me today - SR with PVCs and PACs,  67incomplete LBBB, TWI in lateral I and avL stable, - no acute changes.  VITALS Reviewed today   Temp Readings from Last 3 Encounters:  08/11/21 (!) 96.4 F (35.8 C) (Temporal)  06/30/21 (!) 97.3 F (36.3 C) (Temporal)  06/17/21 (!) 97.2 F (36.2 C) (Temporal)   BP Readings from Last 3 Encounters:  08/25/21 (!) 156/78  08/11/21 (!) 160/71  07/22/21 (!) 162/80   Pulse Readings from Last 3 Encounters:  08/25/21 64  08/11/21 61  07/22/21 73    Wt Readings from Last 3 Encounters:  08/25/21 176 lb 6.4 oz (80 kg)  08/11/21 167 lb (75.8 kg)  07/22/21 178 lb 3.2 oz (80.8 kg)     LABS  reviewed today   Lab Results  Component Value Date   WBC 5.4 04/13/2021   HGB 10.6 (L) 04/13/2021   HCT 33.3 (L) 04/13/2021   MCV 84.3 04/13/2021   PLT 215 04/13/2021   Lab Results  Component Value Date   CREATININE 0.64 05/10/2021   BUN 16 05/10/2021   NA 139 05/10/2021   K 4.9 05/10/2021   CL 103 05/10/2021   CO2 22 05/10/2021   Lab Results  Component Value Date   ALT 12 (L) 02/05/2018   AST 14 (L) 02/05/2018   ALKPHOS 78 02/05/2018   BILITOT 0.5 02/05/2018   Lab Results  Component Value Date   CHOL 193 02/04/2021   HDL 70 02/04/2021   LDLCALC 108 (H) 02/04/2021   TRIG 82 02/04/2021   CHOLHDL 2.2 07/23/2019    Lab Results  Component Value Date   HGBA1C 6.9 (H) 02/04/2021   Lab Results  Component Value Date   TSH 1.824 04/13/2021     STUDIES/PROCEDURES reviewed today   Cardiac monitoring/Zio XT 04/14/2021 Patient had a min HR of 47 bpm, max HR of 179 bpm, and avg HR of 64 bpm. Predominant underlying rhythm was Sinus Rhythm. Bundle Branch Block/IVCD was present. 106 Supraventricular Tachycardia runs occurred, the run with the fastest interval lasting 4 beats with a max rate of 179 bpm, the longest lasting 4 mins 11 secs with an avg rate of 111 bpm. Isolated SVEs were frequent (8.6%, 04/16/2021), SVE Couplets were occasional (1.4%, 9304), and SVE Triplets were rare (<1.0%,  3465). Isolated VEs were rare (<1.0%), and no VE Couplets  or VE Triplets were present.  Echocardiogram 04/13/2021  1. Left ventricular ejection fraction, by estimation, is 60 to 65%. The  left ventricle has normal function. Left ventricular endocardial border  not optimally defined to evaluate regional wall motion. There is mild left  ventricular hypertrophy. Left  ventricular diastolic parameters are consistent with Grade I diastolic  dysfunction (impaired relaxation).   2. Right ventricular systolic function was not well visualized. The right  ventricular size is normal.   3. The mitral valve is normal in structure. Mild mitral valve  regurgitation. No evidence of mitral stenosis.   4. The aortic valve was not well visualized. Aortic valve regurgitation  is mild. No aortic stenosis is present.   FINDINGS   Left Ventricle: Left ventricular ejection fraction, by estimation, is 60  to 65%. The left ventricle has normal function. Left ventricular  endocardial border not optimally defined to evaluate regional wall motion.  The left ventricular internal cavity  size was normal in size. There is mild left ventricular hypertrophy.  Abnormal (paradoxical) septal motion, consistent with left bundle branch  block. Left ventricular diastolic parameters are consistent with Grade I  diastolic dysfunction (impaired  relaxation).   Echo 01/2018 - Procedure narrative: Transthoracic echocardiography. Image    quality was suboptimal.  - Left ventricle: The cavity size was normal. Wall thickness could    not be accurately determined. Systolic function was vigorous. The    estimated ejection fraction was in the range of 65% to 70%.    Doppler parameters are consistent with abnormal left ventricular    relaxation (grade 1 diastolic dysfunction). Doppler parameters    are consistent with high ventricular filling pressure.  - Aortic valve: There was probably mild to moderate regurgitation,    though  suboptimal acoustic windows limit evaluation.  - Left atrium: The atrium was mildly dilated.  - Right ventricle: The cavity size was normal. Systolic function    was normal.  - Pulmonary arteries: Systolic pressure was mildly to moderately    increased, in the range of 35 mm Hg to 40 mm Hg plus central    venous/right atrial pressure.    Ambulatory cardiac monitoring 01/2018 The patient was monitored for 14 days. The predominant rhythm was sinus with intermittent intraventricular conduction delay. The average rate was 66 bpm (range 43-152 bpm). Occasional PVC's and rare PAC's were observed. Five episodes of non-sustained ventricular tachycardia versus SVT with aberrancy were seen, lasting up to 9 beats. Nine episodes of supraventricular tachycardia were seen, lasting up to 7 beats. There were no sustained arrhythmias or prolonged pauses. The patient did not report any symptoms.   Predominantly sinus rhythm with occasional PVC's and rare PAC's.  Brief runs of SVT and NSVT noted.    Assessment & Plan   Hypertension, goal BP <130/80 --BP today poorly controlled at both start in the end of the visit (rechecked at end of visit).  She continues on olmesartan and HCTZ.  She has been tolerating HCTZ well with recommendation for repeat BMET today and increase of HCTZ if renal function and electrolytes allow.  She does report improvement in headache and swelling with HCTZ.  Ongoing fluid and sodium restriction encouraged.  Valvular heart disease with mild mitral and aortic regurgitation -- Denies any dizziness.  Moderate to 1+ bilateral lower extremity edema present, though with patient report that this is improved from previous clinic visits.  She does report that this is somewhat dependent, given the amount of time she spends with  her legs hanging down.  Leg elevation and compression stockings encouraged.  Continue HCTZ with possible increase in dose pending today's labs as above.  Repeat echo with  preserved LVEF and G1 DD, mild MR, trace aortic valve regurgitation, which was reviewed again with the patient and her family.  She will contact the office if any worsening dizziness, chest pain, or shortness of breath.   Medication changes: Pending BMET, possibly increase HCTZ for further BP support Labs ordered: BMET today, BMET RTC Studies / Imaging ordered: None Future considerations:?  BP Disposition: RTC 1 month  *Please be aware that the above documentation was completed voice recognition software and may contain dictation errors.      Lennon Alstrom, PA-C 08/25/2021

## 2021-08-26 ENCOUNTER — Telehealth: Payer: Self-pay | Admitting: Physician Assistant

## 2021-08-26 DIAGNOSIS — I10 Essential (primary) hypertension: Secondary | ICD-10-CM

## 2021-08-26 LAB — BASIC METABOLIC PANEL
BUN/Creatinine Ratio: 30 — ABNORMAL HIGH (ref 12–28)
BUN: 24 mg/dL (ref 8–27)
CO2: 19 mmol/L — ABNORMAL LOW (ref 20–29)
Calcium: 9.2 mg/dL (ref 8.7–10.3)
Chloride: 104 mmol/L (ref 96–106)
Creatinine, Ser: 0.8 mg/dL (ref 0.57–1.00)
Glucose: 146 mg/dL — ABNORMAL HIGH (ref 70–99)
Potassium: 4.6 mmol/L (ref 3.5–5.2)
Sodium: 140 mmol/L (ref 134–144)
eGFR: 71 mL/min/{1.73_m2} (ref >59)

## 2021-08-26 MED ORDER — HYDROCHLOROTHIAZIDE 25 MG PO TABS: 25.0000 mg | ORAL_TABLET | Freq: Every day | ORAL | 3 refills | Status: DC

## 2021-08-26 NOTE — Telephone Encounter (Signed)
Reached [name] to review labs and instructions:  --Increase to HCTZ 25mg  daily  --Repeat BMET when she sees Dr. at her upcoming visit.  I will send a new script to her pharmacy. She will take two HCTZ 12.5mg  tablets per day until she gets the new rx.

## 2021-09-05 ENCOUNTER — Other Ambulatory Visit: Payer: Self-pay | Admitting: Family Medicine

## 2021-09-05 DIAGNOSIS — E119 Type 2 diabetes mellitus without complications: Secondary | ICD-10-CM

## 2021-09-14 ENCOUNTER — Other Ambulatory Visit: Payer: Self-pay

## 2021-09-14 ENCOUNTER — Encounter: Payer: Self-pay | Admitting: Family Medicine

## 2021-09-14 ENCOUNTER — Ambulatory Visit (INDEPENDENT_AMBULATORY_CARE_PROVIDER_SITE_OTHER): Payer: Medicare Other | Admitting: Family Medicine

## 2021-09-14 VITALS — BP 120/72 | HR 64 | Ht 63.0 in | Wt 165.0 lb

## 2021-09-14 DIAGNOSIS — J3089 Other allergic rhinitis: Secondary | ICD-10-CM | POA: Diagnosis not present

## 2021-09-14 DIAGNOSIS — Z23 Encounter for immunization: Secondary | ICD-10-CM | POA: Diagnosis not present

## 2021-09-14 DIAGNOSIS — K219 Gastro-esophageal reflux disease without esophagitis: Secondary | ICD-10-CM

## 2021-09-14 DIAGNOSIS — E119 Type 2 diabetes mellitus without complications: Secondary | ICD-10-CM

## 2021-09-14 DIAGNOSIS — H524 Presbyopia: Secondary | ICD-10-CM | POA: Diagnosis not present

## 2021-09-14 MED ORDER — METFORMIN HCL 500 MG PO TABS: ORAL_TABLET | ORAL | 1 refills | Status: DC

## 2021-09-14 MED ORDER — MONTELUKAST SODIUM 10 MG PO TABS: 10.0000 mg | ORAL_TABLET | Freq: Every day | ORAL | 1 refills | Status: DC

## 2021-09-14 MED ORDER — OMEPRAZOLE 20 MG PO CPDR: 20.0000 mg | DELAYED_RELEASE_CAPSULE | Freq: Every day | ORAL | 1 refills | Status: DC

## 2021-09-14 NOTE — Progress Notes (Signed)
Date:  09/14/2021   Name:  [name]   DOB:  1932/07/02   MRN:  [id number]   Chief Complaint: Gastroesophageal Reflux, Diabetes, Allergic Rhinitis , and Flu Vaccine  Gastroesophageal Reflux She reports no abdominal pain, no chest pain, no coughing, no dysphagia, no heartburn, no nausea, no sore throat or no wheezing. This is a chronic problem. The problem has been gradually improving. She has tried a PPI for the symptoms. The treatment provided moderate relief.  Diabetes She presents for her follow-up diabetic visit. She has type 2 diabetes mellitus. Her disease course has been stable. Pertinent negatives for hypoglycemia include no dizziness, headaches or nervousness/anxiousness. Pertinent negatives for diabetes include no blurred vision, no chest pain, no polydipsia and no polyuria. There are no hypoglycemic complications. Symptoms are stable. Current diabetic treatment includes oral agent (monotherapy). Her weight is stable. She is following a generally healthy diet. Meal planning includes avoidance of concentrated sweets and carbohydrate counting. Her breakfast blood glucose is taken between 8-9 am.   Lab Results  Component Value Date   CREATININE 0.80 08/25/2021   BUN 24 08/25/2021   NA 140 08/25/2021   K 4.6 08/25/2021   CL 104 08/25/2021   CO2 19 (L) 08/25/2021   Lab Results  Component Value Date   CHOL 193 02/04/2021   HDL 70 02/04/2021   LDLCALC 108 (H) 02/04/2021   TRIG 82 02/04/2021   CHOLHDL 2.2 07/23/2019   Lab Results  Component Value Date   TSH 1.824 04/13/2021   Lab Results  Component Value Date   HGBA1C 6.9 (H) 02/04/2021   Lab Results  Component Value Date   WBC 5.4 04/13/2021   HGB 10.6 (L) 04/13/2021   HCT 33.3 (L) 04/13/2021   MCV 84.3 04/13/2021   PLT 215 04/13/2021   Lab Results  Component Value Date   ALT 12 (L) 02/05/2018   AST 14 (L) 02/05/2018   ALKPHOS 78 02/05/2018   BILITOT 0.5 02/05/2018     Review of Systems   Constitutional:  Negative for chills and fever.  HENT:  Negative for drooling, ear discharge, ear pain and sore throat.   Eyes:  Negative for blurred vision.  Respiratory:  Negative for cough, shortness of breath and wheezing.   Cardiovascular:  Negative for chest pain, palpitations and leg swelling.  Gastrointestinal:  Negative for abdominal pain, blood in stool, constipation, diarrhea, dysphagia, heartburn and nausea.  Endocrine: Negative for polydipsia and polyuria.  Genitourinary:  Negative for dysuria, frequency, hematuria and urgency.  Musculoskeletal:  Negative for back pain, myalgias and neck pain.  Skin:  Negative for rash.  Allergic/Immunologic: Negative for environmental allergies.  Neurological:  Negative for dizziness and headaches.  Hematological:  Does not bruise/bleed easily.  Psychiatric/Behavioral:  Negative for suicidal ideas. The patient is not nervous/anxious.    Patient Active Problem List   Diagnosis Date Noted   Valvular heart disease 07/23/2021   Chronic pain of left knee 06/17/2021   Chronic pain syndrome 06/17/2021   Near syncope 04/12/2021   Pain due to total left knee replacement (HCC) 03/02/2021   Aortic valve regurgitation 10/23/2020   Bradycardia 09/04/2019   PVC's (premature ventricular contractions) 09/04/2019   Leg edema 09/04/2019   Sinus bradycardia 03/16/2018   PSVT (paroxysmal supraventricular tachycardia) (HCC) 03/16/2018   NSVT (nonsustained ventricular tachycardia) 03/16/2018   Malignant HTN with heart disease, w/o CHF, w/o chronic kidney disease 02/05/2018   Hyperlipidemia 09/24/2015   Diabetes mellitus with no complication (HCC)  03/16/2015   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   Arthritis of knee, degenerative 03/16/2015   Sinus infection 03/16/2015   Diabetes (HCC)    Essential hypertension    GERD (gastroesophageal reflux disease)    History of bilateral knee replacement     Allergies  Allergen  Reactions   Nsaids    Lisinopril     Cough    Past Surgical History:  Procedure Laterality Date   BACK SURGERY     CATARACT EXTRACTION Bilateral 03/2016   CERVICAL DISC SURGERY  12/28/2010   "bulging disc; went in on the left side of my neck" (08/19/2013)   JOINT REPLACEMENT     TOTAL KNEE ARTHROPLASTY Left 03/28/2011   Dr Thurston Hole   TOTAL KNEE ARTHROPLASTY Right 08/19/2013   TOTAL KNEE ARTHROPLASTY Right 08/19/2013   Procedure: TOTAL KNEE ARTHROPLASTY;  Surgeon: Nilda Simmer, MD;  Location: [facility] OR;  Service: Orthopedics;  Laterality: Right;    Social History   Tobacco Use   Smoking status: Never   Smokeless tobacco: Never   Tobacco comments:    Smoking cessation materials not required  Vaping Use   Vaping Use: Never used  Substance Use Topics   Alcohol use: Not Currently   Drug use: Never     Medication list has been reviewed and updated.  Current Meds  Medication Sig   ALPRAZolam (XANAX) 0.25 MG tablet Take 1 tablet (0.25 mg total) by mouth as needed for anxiety. To be taken if anxious prior to procedure   cyanocobalamin 100 MCG tablet Take 1 tablet by mouth daily.    diclofenac Sodium (VOLTAREN) 1 % GEL Apply 4 g topically 4 (four) times daily as needed.   docusate sodium (COLACE) 50 MG capsule Take 1 capsule (50 mg total) by mouth 2 (two) times daily.   glucose blood (ONE TOUCH ULTRA TEST) test strip USE 1 STRIP TO CHECK GLUCOSE ONCE DAILY   hydrochlorothiazide (HYDRODIURIL) 25 MG tablet Take 1 tablet (25 mg total) by mouth daily.   loratadine (CLARITIN) 10 MG tablet Take 1 tablet (10 mg total) by mouth daily.   metFORMIN (GLUCOPHAGE) 500 MG tablet TAKE 1 TABLET BY MOUTH TWICE DAILY WITH MEALS   mometasone (NASONEX) 50 MCG/ACT nasal spray Place 2 sprays into the nose daily.   montelukast (SINGULAIR) 10 MG tablet Take 1 tablet (10 mg total) by mouth at bedtime.   olmesartan (BENICAR) 20 MG tablet Take 1 tablet (20 mg total) by mouth at bedtime.   omeprazole (PRILOSEC) 20  MG capsule Take 1 capsule (20 mg total) by mouth daily.    PHQ 2/9 Scores 09/14/2021 08/18/2021 06/30/2021 05/20/2021  PHQ - 2 Score 0 0 0 0  PHQ- 9 Score 0 - - 0    GAD 7 : Generalized Anxiety Score 09/14/2021 05/20/2021 04/23/2021 03/12/2021  Nervous, Anxious, on Edge 0 0 0 0  Control/stop worrying 0 0 0 0  Worry too much - different things 0 0 0 0  Trouble relaxing 0 0 0 0  Restless 0 0 0 0  Easily annoyed or irritable 0 0 0 0  Afraid - awful might happen 0 0 0 0  Total GAD 7 Score 0 0 0 0    BP Readings from Last 3 Encounters:  09/14/21 120/72  08/25/21 (!) 156/78  08/11/21 (!) 160/71    Physical Exam Vitals and nursing note reviewed.  HENT:     Nose: No congestion or rhinorrhea.  Cardiovascular:     Rate and  Rhythm: Normal rate.     Pulses: Normal pulses.     Heart sounds: Normal heart sounds. No murmur heard.   No friction rub. No gallop.  Pulmonary:     Breath sounds: No wheezing, rhonchi or rales.  Abdominal:     General: Abdomen is flat.     Tenderness: There is no abdominal tenderness.  Neurological:     Mental Status: She is alert.    Wt Readings from Last 3 Encounters:  09/14/21 165 lb (74.8 kg)  08/25/21 176 lb 6.4 oz (80 kg)  08/11/21 167 lb (75.8 kg)    BP 120/72   Pulse 64   Ht 5\' 3"  (1.6 m)   Wt 165 lb (74.8 kg)   BMI 29.23 kg/m   Assessment and Plan:  1. Type 2 diabetes mellitus without complication, without long-term current use of insulin (HCC) Chronic.  Controlled.  Stable.  Recheck on A1c previous is 6.9.  Continue metformin 500 mg twice a day.  Patient has no GFR concerns at this time.  Recheck A1c and adjust accordingly. - metFORMIN (GLUCOPHAGE) 500 MG tablet; TAKE 1 TABLET BY MOUTH TWICE DAILY WITH MEALS  Dispense: 180 tablet; Refill: 1 - HgB A1c  2. Non-seasonal allergic rhinitis, unspecified trigger Controlled.  Stable.  Continue Singulair 10 mg as well as Claritin and nasal steroid. - montelukast (SINGULAIR) 10 MG tablet; Take 1  tablet (10 mg total) by mouth at bedtime.  Dispense: 90 tablet; Refill: 1  3. Gastroesophageal reflux disease Chronic.  Controlled.  Stable.  Continue omeprazole 20 mg once a day. - omeprazole (PRILOSEC) 20 MG capsule; Take 1 capsule (20 mg total) by mouth daily.  Dispense: 90 capsule; Refill: 1  4. Need for immunization against influenza Discussed and administered. - Flu Vaccine QUAD High Dose(Fluad)

## 2021-09-15 ENCOUNTER — Ambulatory Visit
Payer: Medicare Other | Attending: Student in an Organized Health Care Education/Training Program | Admitting: Student in an Organized Health Care Education/Training Program

## 2021-09-15 ENCOUNTER — Other Ambulatory Visit: Payer: Self-pay

## 2021-09-15 DIAGNOSIS — G894 Chronic pain syndrome: Secondary | ICD-10-CM

## 2021-09-15 DIAGNOSIS — Z96652 Presence of left artificial knee joint: Secondary | ICD-10-CM | POA: Diagnosis not present

## 2021-09-15 DIAGNOSIS — T8484XS Pain due to internal orthopedic prosthetic devices, implants and grafts, sequela: Secondary | ICD-10-CM | POA: Diagnosis not present

## 2021-09-15 DIAGNOSIS — E119 Type 2 diabetes mellitus without complications: Secondary | ICD-10-CM

## 2021-09-15 DIAGNOSIS — M25562 Pain in left knee: Secondary | ICD-10-CM | POA: Diagnosis not present

## 2021-09-15 DIAGNOSIS — G8929 Other chronic pain: Secondary | ICD-10-CM | POA: Diagnosis not present

## 2021-09-15 DIAGNOSIS — Z96653 Presence of artificial knee joint, bilateral: Secondary | ICD-10-CM | POA: Diagnosis not present

## 2021-09-15 LAB — HEMOGLOBIN A1C
Est. average glucose Bld gHb Est-mCnc: 169 mg/dL
Hgb A1c MFr Bld: 7.5 % — ABNORMAL HIGH (ref 4.8–5.6)

## 2021-09-15 MED ORDER — GLIPIZIDE ER 2.5 MG PO TB24: 2.5000 mg | ORAL_TABLET | Freq: Every day | ORAL | 0 refills | Status: DC

## 2021-09-15 NOTE — Progress Notes (Unsigned)
Sent in glipizide °

## 2021-09-15 NOTE — Progress Notes (Signed)
Patient: [name]  Service Category: E/M  Provider: Bilal Lateef, MD  DOB: 10/01/1932  DOS: 09/15/2021  Location: Office  MRN: [id number]  Setting: Ambulatory outpatient  Referring Provider: Jones, Deanna C, MD  Type: Established Patient  Specialty: Interventional Pain Management  PCP: Jones, Deanna C, MD  Location: Home  Delivery: TeleHealth     Virtual Encounter - Pain Management PROVIDER NOTE: Information contained herein reflects review and annotations entered in association with encounter. Interpretation of such information and data should be left to medically-trained personnel. Information provided to patient can be located elsewhere in the medical record under "Patient Instructions". Document created using STT-dictation technology, any transcriptional errors that may result from process are unintentional.    Contact & Pharmacy Preferred: [phone] Home: [phone] (home) Mobile: [phone] (mobile) E-mail: No e-mail address on record  [id number] - [place], [place] - [address] [place] [address] Phone: [phone] Fax: [phone]  [id number] - [place], [place] - [address] [place] [address] Phone: [phone] Fax: [phone]   Pre-screening  Ms. Kanode offered "in-person" vs "virtual" encounter. She indicated preferring virtual for this encounter.   Reason COVID-19*  Social distancing based on CDC and AMA recommendations.   I contacted Lilian W Luce on 09/15/2021 via telephone.      I clearly identified myself as Bilal Lateef, MD. I verified that I was speaking with the correct person using two identifiers (Name: Kimmy W Winberry, and date of birth: 06/02/1932).  Consent I sought verbal advanced consent from Cleatus W Lama for virtual visit interactions. I informed Ms. Vickrey of possible security and privacy concerns, risks, and limitations associated with providing  "not-in-person" medical evaluation and management services. I also informed Ms. Gaines of the availability of "in-person" appointments. Finally, I informed her that there would be a charge for the virtual visit and that she could be  personally, fully or partially, financially responsible for it. Ms. Pagaduan expressed understanding and agreed to proceed.   Historic Elements   Ms. Aubriel W Minkin is a 87 y.o. year old, female patient evaluated today after our last contact on 08/11/2021. Ms. Muston  has a past medical history of GERD (gastroesophageal reflux disease), Hypertension, Right knee DJD, and Type II diabetes mellitus (HCC). She also  has a past surgical history that includes Total knee arthroplasty (Left, 03/28/2011); Cervical disc surgery (12/28/2010); Total knee arthroplasty (Right, 08/19/2013); Joint replacement; Back surgery; Total knee arthroplasty (Right, 08/19/2013); and Cataract extraction (Bilateral, 03/2016). Ms. Berrones has a current medication list which includes the following prescription(s): alprazolam, cyanocobalamin, diclofenac sodium, docusate sodium, glucose blood, hydrochlorothiazide, loratadine, mometasone, olmesartan, glipizide, metformin, montelukast, and omeprazole. She  reports that she has never smoked. She has never used smokeless tobacco. She reports that she does not currently use alcohol. She reports that she does not use drugs. Ms. Paske is allergic to nsaids and lisinopril.   HPI  Today, she is being contacted for a post-procedure assessment.   Post-Procedure Evaluation  Procedure (08/11/2021):   Left knee genicular nerve block #2  Anxiolysis: Please see nurses note.  Effectiveness during initial hour after procedure (Ultra-Short Term Relief): 100 %   Local anesthetic used: Long-acting (4-6 hours) Effectiveness: Defined as any analgesic benefit obtained secondary to the administration of local anesthetics. This carries significant diagnostic value as to the  etiological location, or anatomical origin, of the pain. Duration of benefit is expected to coincide with the duration of the local   anesthetic used.  Effectiveness during initial 4-6 hours after procedure (Short-Term Relief): 100 %   Long-term benefit: Defined as any relief past the pharmacologic duration of the local anesthetics.  Effectiveness past the initial 6 hours after procedure (Long-Term Relief): 100 % (the pain relief lasted approx 2 weeks and then gradually returned.  the knee is now stiff and sore.)   Benefits, current: Defined as benefit present at the time of this evaluation.   Analgesia:  <50% Function: Somewhat improved    Laboratory Chemistry Profile   Renal Lab Results  Component Value Date   BUN 24 08/25/2021   CREATININE 0.80 08/25/2021   BCR 30 (H) 08/25/2021   GFRAA 91 03/18/2020   GFRNONAA >60 04/13/2021    Hepatic Lab Results  Component Value Date   AST 14 (L) 02/05/2018   ALT 12 (L) 02/05/2018   ALBUMIN 4.0 02/04/2021   ALKPHOS 78 02/05/2018    Electrolytes Lab Results  Component Value Date   NA 140 08/25/2021   K 4.6 08/25/2021   CL 104 08/25/2021   CALCIUM 9.2 08/25/2021   MG 1.7 05/10/2021   PHOS 3.5 02/04/2021    Bone No results found for: VD25OH, VD125OH2TOT, [id number], MC9470JG2, 25OHVITD1, 25OHVITD2, 25OHVITD3, TESTOFREE, TESTOSTERONE  Inflammation (CRP: Acute Phase) (ESR: Chronic Phase) No results found for: CRP, ESRSEDRATE, LATICACIDVEN       Note: Above Lab results reviewed.   Assessment  The primary encounter diagnosis was Pain due to total left knee replacement, sequela. Diagnoses of Chronic pain of left knee, History of bilateral knee replacement, and Chronic pain syndrome were also pertinent to this visit.  Plan of Care    Patient is status post 2 positive diagnostic left knee genicular nerve blocks performed on 07/01/2019 to 08/11/2021 which provided greater than 50% pain relief for at least 2 weeks.  Patient noticed return  of her pain thereafter.  We discussed neck steps which could include genicular nerve RFA.  Risks and benefits were reviewed with patient in great detail and she would like to proceed with this.  We will plan on doing this with p.o. Valium.  Orders:  Orders Placed This Encounter  Procedures   Radiofrequency,Genicular    Standing Status:   Future    Standing Expiration Date:   09/15/2022    Scheduling Instructions:     Side(s): LEFT Knee     Level(s): Superior-Lateral, Superior-Medial, and Inferior-Medial Genicular Nerve(s)     Sedation: PO Valium     Scheduling Timeframe: As soon as pre-approved    Order Specific Question:   Where will this procedure be performed?    Answer:   [facility]    Follow-up plan:   Return in about 2 weeks (around 09/29/2021) for Left GN RFA , minimal sedation (PO Valium).     Left knee genicular NB 06/30/21, 08/11/21   Recent Visits Date Type Provider Dept  08/11/21 Procedure visit Gillis Santa, MD [facility]  07/20/21 Telemedicine Gillis Santa, MD [facility]  06/30/21 Procedure visit Gillis Santa, MD [facility]  06/17/21 Office Visit Gillis Santa, MD [facility]  Showing recent visits within past 90 days and meeting all other requirements Today's Visits Date Type Provider Dept  09/15/21 Office Visit Gillis Santa, MD [facility]  Showing today's visits and meeting all other requirements Future Appointments No visits were found meeting these conditions. Showing future appointments within next 90 days and meeting all other requirements I discussed the assessment and  treatment plan with the patient. The patient was provided an opportunity to ask questions and all were answered. The patient agreed with the plan and demonstrated an understanding of the instructions.  Patient advised to call back or seek an in-person evaluation if the symptoms or condition worsens.  Duration of encounter:  30minutes.  Note by: Bilal Lateef, MD Date: 09/15/2021; Time: 3:10 PM  

## 2021-09-30 ENCOUNTER — Encounter: Payer: Self-pay | Admitting: Internal Medicine

## 2021-09-30 ENCOUNTER — Ambulatory Visit (INDEPENDENT_AMBULATORY_CARE_PROVIDER_SITE_OTHER): Payer: Medicare Other | Admitting: Internal Medicine

## 2021-09-30 ENCOUNTER — Other Ambulatory Visit: Payer: Self-pay

## 2021-09-30 VITALS — BP 108/58 | HR 62 | Ht 63.0 in | Wt 173.0 lb

## 2021-09-30 DIAGNOSIS — I4729 Other ventricular tachycardia: Secondary | ICD-10-CM | POA: Diagnosis not present

## 2021-09-30 DIAGNOSIS — I38 Endocarditis, valve unspecified: Secondary | ICD-10-CM | POA: Diagnosis not present

## 2021-09-30 DIAGNOSIS — I10 Essential (primary) hypertension: Secondary | ICD-10-CM | POA: Diagnosis not present

## 2021-09-30 DIAGNOSIS — I471 Supraventricular tachycardia, unspecified: Secondary | ICD-10-CM

## 2021-09-30 MED ORDER — HYDROCHLOROTHIAZIDE 12.5 MG PO TABS: 12.5000 mg | ORAL_TABLET | Freq: Every day | ORAL | 3 refills | Status: DC

## 2021-09-30 NOTE — Progress Notes (Signed)
Follow-up Outpatient Visit Date: 09/30/2021  Primary Care Provider: Duanne Limerick, MD [address] [place] [address]  Chief Complaint: Follow-up hypertension  HPI:  [name] is a 87 y.o. female with history of aortic and mitral valve regurgitation, NSVT, PSVT, hypertension, and type 2 diabetes mellitus, who presents for follow-up of hypertension.  She was last seen a month ago by Marisue Ivan, NP, at which time she reported feeling well with less leg swelling and cessation of headaches.  Blood pressure was elevated at 156/78.  HCTZ was increased to 25 mg daily.  Today, [name] reports that she has been feeling well, denying chest pain, shortness of breath, palpitations, lightheadedness, and edema.  She has not noticed any side effects from her BP medications.  --------------------------------------------------------------------------------------------------  Cardiovascular History & Procedures: Cardiovascular Problems: Bradycardia with paroxysmal SVT and NSVT   Risk Factors: Hypertension, age greater than 20, and obesity   Cath/PCI: None   CV Surgery: None   EP Procedures and Devices: 14-day event monitor (04/14/2021): Rhythm with frequent PACs and multiple episodes of PSVT lasting up to 4 minutes, 11 seconds.  Rare PVCs. 14-day event monitor (02/07/2018): Predominantly sinus rhythm with occasional PVC's and rare PAC's.  Brief runs of SVT and NSVT noted.   Non-Invasive Evaluation(s): TTE (04/13/2021): Normal LV size with mild LVH.  LVEF 60-65% with grade 1 diastolic dysfunction.  Normal RV size and function.  Normal biatrial size.  Mild mitral and tricuspid regurgitation.  Mild aortic regurgitation. Pharmacologic myocardial perfusion stress test (02/07/2018): Normal study without ischemia or scar.  LVEF 54%. TTE (02/06/2018): Technically difficult study.  Normal LV size.  LVEF 65 to 70% with grade 1 diastolic dysfunction and elevated filling pressure.   Mild to moderate aortic regurgitation.  Mild left atrial enlargement.  Normal RV size and function.  Mildly to moderately increased pulmonary artery pressure.  Recent CV Pertinent Labs: Lab Results  Component Value Date   CHOL 193 02/04/2021   HDL 70 02/04/2021   LDLCALC 108 (H) 02/04/2021   TRIG 82 02/04/2021   CHOLHDL 2.2 07/23/2019   INR 1.10 08/12/2013   K 4.6 08/25/2021   MG 1.7 05/10/2021   BUN 24 08/25/2021   CREATININE 0.80 08/25/2021    Past medical and surgical history were reviewed and updated in EPIC.  Current Meds  Medication Sig   ALPRAZolam (XANAX) 0.25 MG tablet Take 1 tablet (0.25 mg total) by mouth as needed for anxiety. To be taken if anxious prior to procedure   cyanocobalamin 100 MCG tablet Take 1 tablet by mouth daily.    diclofenac Sodium (VOLTAREN) 1 % GEL Apply 4 g topically 4 (four) times daily as needed.   docusate sodium (COLACE) 50 MG capsule Take 1 capsule (50 mg total) by mouth 2 (two) times daily.   glipiZIDE (GLUCOTROL XL) 2.5 MG 24 hr tablet Take 1 tablet (2.5 mg total) by mouth daily with breakfast.   glucose blood (ONE TOUCH ULTRA TEST) test strip USE 1 STRIP TO CHECK GLUCOSE ONCE DAILY   loratadine (CLARITIN) 10 MG tablet Take 1 tablet (10 mg total) by mouth daily.   metFORMIN (GLUCOPHAGE) 500 MG tablet TAKE 1 TABLET BY MOUTH TWICE DAILY WITH MEALS   mometasone (NASONEX) 50 MCG/ACT nasal spray Place 2 sprays into the nose daily.   montelukast (SINGULAIR) 10 MG tablet Take 1 tablet (10 mg total) by mouth at bedtime.   olmesartan (BENICAR) 20 MG tablet Take 1 tablet (20 mg total) by  mouth at bedtime.   omeprazole (PRILOSEC) 20 MG capsule Take 1 capsule (20 mg total) by mouth daily.   [DISCONTINUED] hydrochlorothiazide (HYDRODIURIL) 25 MG tablet Take 1 tablet (25 mg total) by mouth daily.    Allergies: Nsaids and Lisinopril  Social History   Tobacco Use   Smoking status: Never   Smokeless tobacco: Never   Tobacco comments:    Smoking  cessation materials not required  Vaping Use   Vaping Use: Never used  Substance Use Topics   Alcohol use: Not Currently   Drug use: Never    Family History  Problem Relation Age of Onset   Diabetes Mother    Cerebral aneurysm Father     Review of Systems: A 12-system review of systems was performed and was negative except as noted in the HPI.  --------------------------------------------------------------------------------------------------  Physical Exam: BP (!) 108/58 (BP Location: Right Arm, Patient Position: Sitting, Cuff Size: Large)   Pulse 62   Ht 5\' 3"  (1.6 m)   Wt 173 lb (78.5 kg)   SpO2 97%   BMI 30.65 kg/m   General:  NAD. Neck: No JVD or HJR. Lungs: Clear to auscultation bilaterally without wheezes or crackles. Heart: Regular rate and rhythm with 3/6 systolic murmur.  No rubs or gallops. Abdomen: Soft, nontender, nondistended. Extremities: No lower extremity edema.  EKG:  NSR with LBBB.  PVC's no longer present.  Otherwise, no significant change since 08/25/2021.  Lab Results  Component Value Date   WBC 5.4 04/13/2021   HGB 10.6 (L) 04/13/2021   HCT 33.3 (L) 04/13/2021   MCV 84.3 04/13/2021   PLT 215 04/13/2021    Lab Results  Component Value Date   NA 140 08/25/2021   K 4.6 08/25/2021   CL 104 08/25/2021   CO2 19 (L) 08/25/2021   BUN 24 08/25/2021   CREATININE 0.80 08/25/2021   GLUCOSE 146 (H) 08/25/2021   ALT 12 (L) 02/05/2018    Lab Results  Component Value Date   CHOL 193 02/04/2021   HDL 70 02/04/2021   LDLCALC 108 (H) 02/04/2021   TRIG 82 02/04/2021   CHOLHDL 2.2 07/23/2019    --------------------------------------------------------------------------------------------------  ASSESSMENT AND PLAN: Hypertension: BP much improved since last visit, actually borderline low.  I worry about risk for symptomatic hypotension/falls if BP continues to drop.  We will therefore return HCTZ back to 12.5 mg daily.  We will continue olmesartan  20 mg daily.  Valvular heart disease: No signs or symptoms of heart failure today.  Continue current medications, with echo earlier this year showing normal LVEF and mild valvular disease involving the aortic and mitral valves.  PVC's, PSVT, and NSVT: No symptoms reported.  EKG today without ectopy.  Defer further workup/intervention at this time.  Follow-up: Return to clinic in 6 months.  Nelva Bush, MD 10/01/2021 3:17 PM

## 2021-09-30 NOTE — Patient Instructions (Signed)
Medication Instructions:  DECREASE: Hydrochlorothiazide to 12.5 mg daily   *If you need a refill on your cardiac medications before your next appointment, please call your pharmacy*   Lab Work: None ordered today   Testing/Procedures: None ordered today   Follow-Up: At [facility], you and your health needs are our priority.  As part of our continuing mission to provide you with exceptional heart care, we have created designated Provider Care Teams.  These Care Teams include your primary Cardiologist (physician) and Advanced Practice Providers (APPs -  Physician Assistants and Nurse Practitioners) who all work together to provide you with the care you need, when you need it.  We recommend signing up for the patient portal called "MyChart".  Sign up information is provided on this After Visit Summary.  MyChart is used to connect with patients for Virtual Visits (Telemedicine).  Patients are able to view lab/test results, encounter notes, upcoming appointments, etc.  Non-urgent messages can be sent to your provider as well.   To learn more about what you can do with MyChart, go to [email or web address].    Your next appointment:   6 month(s)  The format for your next appointment:   In Person  Provider:   You may see Yvonne Kendall, MD or one of the following Advanced Practice Providers on your designated Care Team:   Nicolasa Ducking, NP Eula Listen, PA-C Cadence Fransico Michael, PA-C{  Other Instructions Check home blood pressure every 1 to 2 weeks. Please call our office if blood pressure is consistently greater than 130/80 or less than 90/60.

## 2021-10-01 ENCOUNTER — Encounter: Payer: Self-pay | Admitting: Internal Medicine

## 2021-10-06 ENCOUNTER — Ambulatory Visit (HOSPITAL_BASED_OUTPATIENT_CLINIC_OR_DEPARTMENT_OTHER): Payer: Medicare Other | Admitting: Student in an Organized Health Care Education/Training Program

## 2021-10-06 ENCOUNTER — Other Ambulatory Visit: Payer: Self-pay

## 2021-10-06 ENCOUNTER — Ambulatory Visit
Admission: RE | Admit: 2021-10-06 | Discharge: 2021-10-06 | Disposition: A | Discharge status: Home / Self Care | Payer: Medicare Other | Source: Ambulatory Visit | Attending: Student in an Organized Health Care Education/Training Program | Admitting: Student in an Organized Health Care Education/Training Program

## 2021-10-06 ENCOUNTER — Encounter: Payer: Self-pay | Admitting: Student in an Organized Health Care Education/Training Program

## 2021-10-06 DIAGNOSIS — Z96653 Presence of artificial knee joint, bilateral: Secondary | ICD-10-CM | POA: Diagnosis not present

## 2021-10-06 DIAGNOSIS — T8484XS Pain due to internal orthopedic prosthetic devices, implants and grafts, sequela: Secondary | ICD-10-CM

## 2021-10-06 DIAGNOSIS — M25562 Pain in left knee: Secondary | ICD-10-CM | POA: Diagnosis not present

## 2021-10-06 DIAGNOSIS — G894 Chronic pain syndrome: Secondary | ICD-10-CM | POA: Diagnosis not present

## 2021-10-06 DIAGNOSIS — G8929 Other chronic pain: Secondary | ICD-10-CM | POA: Insufficient documentation

## 2021-10-06 DIAGNOSIS — Z96652 Presence of left artificial knee joint: Secondary | ICD-10-CM | POA: Diagnosis not present

## 2021-10-06 MED ORDER — ROPIVACAINE HCL 2 MG/ML IJ SOLN
INTRAMUSCULAR | Status: AC
  Filled 2021-10-06: qty 20

## 2021-10-06 MED ORDER — DIAZEPAM 5 MG PO TABS
2.5000 mg | ORAL_TABLET | Freq: Once | ORAL | Status: AC
  Administered 2021-10-06: 2.5 mg via ORAL

## 2021-10-06 MED ORDER — ROPIVACAINE HCL 2 MG/ML IJ SOLN
9.0000 mL | Freq: Once | INTRAMUSCULAR | Status: AC
  Administered 2021-10-06: 9 mL via PERINEURAL

## 2021-10-06 MED ORDER — DIAZEPAM 5 MG PO TABS
ORAL_TABLET | ORAL | Status: AC
  Filled 2021-10-06: qty 1

## 2021-10-06 MED ORDER — DEXAMETHASONE SODIUM PHOSPHATE 10 MG/ML IJ SOLN
10.0000 mg | Freq: Once | INTRAMUSCULAR | Status: AC
  Administered 2021-10-06: 10 mg
  Filled 2021-10-06: qty 1

## 2021-10-06 MED ORDER — LIDOCAINE HCL 2 % IJ SOLN
20.0000 mL | Freq: Once | INTRAMUSCULAR | Status: AC
  Administered 2021-10-06: 200 mg
  Filled 2021-10-06: qty 40

## 2021-10-06 NOTE — Progress Notes (Signed)
Safety precautions to be maintained throughout the outpatient stay will include: orient to surroundings, keep bed in low position, maintain call bell within reach at all times, provide assistance with transfer out of bed and ambulation.  

## 2021-10-06 NOTE — Progress Notes (Signed)
PROVIDER NOTE: Information contained herein reflects review and annotations entered in association with encounter. Interpretation of such information and data should be left to medically-trained personnel. Information provided to patient can be located elsewhere in the medical record under "Patient Instructions". Document created using STT-dictation technology, any transcriptional errors that may result from process are unintentional.    Patient: [name]  Service Category: Procedure Provider: Edward Jolly, MD DOB: 12-Jan-1932 DOS: 10/06/2021 Location: [facility] MRN: [id number] Setting: Ambulatory - outpatient Referring Provider: Edward Jolly, MD Type: Established Patient Specialty: Interventional Pain Management PCP: Duanne Limerick, MD  Primary Reason for Visit: Interventional Pain Management Treatment. CC: Knee Pain    Procedure:          Anesthesia, Analgesia, Anxiolysis:  Type: Therapeutic Superolateral, Superomedial, and Inferomedial, Genicular Nerve Radiofrequency Ablation (destruction).            Region: Lateral, Anterior, and Medial aspects of the knee joint, above and below the knee joint proper. Level: Superior and inferior to the knee joint. Laterality: Left  Anesthesia: Local (1-2% Lidocaine)  Anxiolysis: Oral  Sedation: Minimal  Guidance: Fluoroscopy           Position: Supine   Indications: 1. Pain due to total left knee replacement, sequela   2. Chronic pain of left knee   3. History of bilateral knee replacement   4. Chronic pain syndrome    [name] has been dealing with the above chronic pain for longer than three months and has either failed to respond, was unable to tolerate, or simply did not get enough benefit from other more conservative therapies including, but not limited to: 1. Over-the-counter medications 2. Anti-inflammatory medications 3. Muscle relaxants 4. Membrane stabilizers 5. Opioids 6. Physical therapy and/or  chiropractic manipulation 7. Modalities (Heat, ice, etc.) 8. Invasive techniques such as nerve blocks. [name] has attained more than 50% relief of the pain from a series of diagnostic injections conducted in separate occasions.  Pain Score: Pre-procedure: 3 /10 Post-procedure: 0-No pain/10    Pre-op H&P Assessment:  [name] is a 87 y.o. (year old), female patient, seen today for interventional treatment. She  has a past surgical history that includes Total knee arthroplasty (Left, 03/28/2011); Cervical disc surgery (12/28/2010); Total knee arthroplasty (Right, 08/19/2013); Joint replacement; Back surgery; Total knee arthroplasty (Right, 08/19/2013); and Cataract extraction (Bilateral, 03/2016). [name] has a current medication list which includes the following prescription(s): alprazolam, cyanocobalamin, diclofenac sodium, docusate sodium, glipizide, glucose blood, hydrochlorothiazide, loratadine, metformin, mometasone, montelukast, olmesartan, and omeprazole. Her primarily concern today is the Knee Pain  Initial Vital Signs:  Pulse/HCG Rate: 68ECG Heart Rate: 73 Temp: (!) 97 F (36.1 C) Resp: 18 BP: (!) 160/75 SpO2: 100 %  BMI: Estimated body mass index is 30.65 kg/m as calculated from the following:   Height as of this encounter: 5\' 3"  (1.6 m).   Weight as of this encounter: 173 lb (78.5 kg).  Risk Assessment: Allergies: Reviewed. She is allergic to nsaids and lisinopril.  Allergy Precautions: None required Coagulopathies: Reviewed. None identified.  Blood-thinner therapy: None at this time Active Infection(s): Reviewed. None identified. [name] is afebrile  Site Confirmation: [name] was asked to confirm the procedure and laterality before marking the site Procedure checklist: Completed Consent: Before the procedure and under the influence of no sedative(s), amnesic(s), or anxiolytics, the patient was informed of the treatment options, risks and possible  complications. To fulfill our ethical and legal obligations, as recommended by  the American Medical Association's Code of Ethics, I have informed the patient of my clinical impression; the nature and purpose of the treatment or procedure; the risks, benefits, and possible complications of the intervention; the alternatives, including doing nothing; the risk(s) and benefit(s) of the alternative treatment(s) or procedure(s); and the risk(s) and benefit(s) of doing nothing. The patient was provided information about the general risks and possible complications associated with the procedure. These may include, but are not limited to: failure to achieve desired goals, infection, bleeding, organ or nerve damage, allergic reactions, paralysis, and death. In addition, the patient was informed of those risks and complications associated to the procedure, such as failure to decrease pain; infection; bleeding; organ or nerve damage with subsequent damage to sensory, motor, and/or autonomic systems, resulting in permanent pain, numbness, and/or weakness of one or several areas of the body; allergic reactions; (i.e.: anaphylactic reaction); and/or death. Furthermore, the patient was informed of those risks and complications associated with the medications. These include, but are not limited to: allergic reactions (i.e.: anaphylactic or anaphylactoid reaction(s)); adrenal axis suppression; blood sugar elevation that in diabetics may result in ketoacidosis or comma; water retention that in patients with history of congestive heart failure may result in shortness of breath, pulmonary edema, and decompensation with resultant heart failure; weight gain; swelling or edema; medication-induced neural toxicity; particulate matter embolism and blood vessel occlusion with resultant organ, and/or nervous system infarction; and/or aseptic necrosis of one or more joints. Finally, the patient was informed that Medicine is not an exact  science; therefore, there is also the possibility of unforeseen or unpredictable risks and/or possible complications that may result in a catastrophic outcome. The patient indicated having understood very clearly. We have given the patient no guarantees and we have made no promises. Enough time was given to the patient to ask questions, all of which were answered to the patient's satisfaction. [name] has indicated that she wanted to continue with the procedure. Attestation: I, the ordering provider, attest that I have discussed with the patient the benefits, risks, side-effects, alternatives, likelihood of achieving goals, and potential problems during recovery for the procedure that I have provided informed consent. Date  Time: 10/06/2021  9:55 AM  Pre-Procedure Preparation:  Monitoring: As per clinic protocol. Respiration, ETCO2, SpO2, BP, heart rate and rhythm monitor placed and checked for adequate function Safety Precautions: Patient was assessed for positional comfort and pressure points before starting the procedure. Time-out: I initiated and conducted the "Time-out" before starting the procedure, as per protocol. The patient was asked to participate by confirming the accuracy of the "Time Out" information. Verification of the correct person, site, and procedure were performed and confirmed by me, the nursing staff, and the patient. "Time-out" conducted as per Joint Commission's Universal Protocol (UP.01.01.01). Time: 1046  Description of Procedure:          Target Area: For Genicular Nerve radiofrequency ablation (destruction), the targets are: the superolateral genicular nerve, located in the lateral distal portion of the femoral shaft as it curves to form the lateral epicondyle, in the region of the distal femoral metaphysis; the superomedial genicular nerve, located in the medial distal portion of the femoral shaft as it curves to form the medial epicondyle; and the inferomedial genicular  nerve, located in the medial, proximal portion of the tibial shaft, as it curves to form the medial epicondyle, in the region of the proximal tibial metaphysis. Approach: Anterior, ipsilateral approach. Area Prepped: Entire knee area, from mid-thigh to  mid-shin, lateral, anterior, and medial aspects. DuraPrep (Iodine Povacrylex [0.7% available iodine] and Isopropyl Alcohol, 74% w/w) Safety Precautions: Aspiration looking for blood return was conducted prior to all injections. At no point did we inject any substances, as a needle was being advanced. No attempts were made at seeking any paresthesias. Safe injection practices and needle disposal techniques used. Medications properly checked for expiration dates. SDV (single dose vial) medications used. Description of the Procedure: Protocol guidelines were followed. The patient was placed in position over the procedure table. The target area was identified and the area prepped in the usual manner. The skin and muscle were infiltrated with local anesthetic. Appropriate amount of time allowed to pass for local anesthetics to take effect. Radiofrequency needles were introduced to the target area using fluoroscopic guidance. Using the NeuroTherm NT1100 Radiofrequency Generator, sensory stimulation using 50 Hz was used to locate & identify the nerve, making sure that the needle was positioned such that there was no sensory stimulation below 0.3 V or above 0.7 V. Stimulation using 2 Hz was used to evaluate the motor component. Care was taken not to lesion any nerves that demonstrated motor stimulation of the lower extremities at an output of less than 2.5 times that of the sensory threshold, or a maximum of 2.0 V. Once satisfactory placement of the needles was achieved, the numbing solution was slowly injected after negative aspiration. After waiting for at least 2 minutes, the ablation was performed at 80 degrees C for 60 seconds, using regular Radiofrequency settings.  Once the procedure was completed, the needles were then removed and the area cleansed, making sure to leave some of the prepping solution back to take advantage of its long term bactericidal properties. Intra-operative Compliance: Compliant  Vitals:   10/06/21 1047 10/06/21 1052 10/06/21 1057 10/06/21 1100  BP: (!) 154/111 (!) 174/71 (!) 162/80 (!) 163/85  Pulse:      Resp: 14 18 17 17   Temp:      TempSrc:      SpO2: 97% 96% 96% 96%  Weight:      Height:        Start Time: 1046 hrs. End Time: 1059 hrs. Materials & Medications:  Needle(s) Type: Teflon-coated, curved tip, Radiofrequency needle(s) Gauge: 22G Length: 10cm Medication(s): Please see orders for medications and dosing details. 9 cc solution made of 7 cc of 0.2% ropivacaine, 2 cc of Decadron 10 mg/cc.  3 cc injected at each level above for the left knee after sensorimotor testing, prior to lesioning. Imaging Guidance (Non-Spinal):          Type of Imaging Technique: Fluoroscopy Guidance (Non-Spinal) Indication(s): Assistance in needle guidance and placement for procedures requiring needle placement in or near specific anatomical locations not easily accessible without such assistance. Exposure Time: Please see nurses notes. Contrast: Before injecting any contrast, we confirmed that the patient did not have an allergy to iodine, shellfish, or radiological contrast. Once satisfactory needle placement was completed at the desired level, radiological contrast was injected. Contrast injected under live fluoroscopy. No contrast complications. See chart for type and volume of contrast used. Fluoroscopic Guidance: I was personally present during the use of fluoroscopy. "Tunnel Vision Technique" used to obtain the best possible view of the target area. Parallax error corrected before commencing the procedure. "Direction-depth-direction" technique used to introduce the needle under continuous pulsed fluoroscopy. Once target was reached,  antero-posterior, oblique, and lateral fluoroscopic projection used confirm needle placement in all planes. Images permanently stored in EMR. Interpretation: I  personally interpreted the imaging intraoperatively. Adequate needle placement confirmed in multiple planes. Appropriate spread of contrast into desired area was observed. No evidence of afferent or efferent intravascular uptake. Permanent images saved into the patient's record.  Post-operative Assessment:  Post-procedure Vital Signs:  Pulse/HCG Rate: 6889 Temp:  (!) 97 F (36.1 C) Resp: 17 BP:  (!) 163/85 SpO2: 96 %  EBL: None  Complications: No immediate post-treatment complications observed by team, or reported by patient.  Note: The patient tolerated the entire procedure well. A repeat set of vitals were taken after the procedure and the patient was kept under observation following institutional policy, for this type of procedure. Post-procedural neurological assessment was performed, showing return to baseline, prior to discharge. The patient was provided with post-procedure discharge instructions, including a section on how to identify potential problems. Should any problems arise concerning this procedure, the patient was given instructions to immediately contact us, at any time, without hesitation. In any case, we plan to contact the patient by telephone for a follow-up status report regarding this interventional procedure.  Comments:  No additional relevant information.  Plan of Care  Orders:  Orders Placed This Encounter  Procedures   DG PAIN CLIN[facility] C-ARM 1-60 MIN NO REPORT    Intraoperative interpretation by procedural physician at [facility].    Standing Status:   Standing    Number of Occurrences:   1    Order Specific Question:   Reason for exam:    Answer:   Assistance in needle guidance and placement for procedures requiring needle placement in or near specific anatomical locations not easily  accessible without such assistance.      Medications ordered for procedure: Meds ordered this encounter  Medications   lidocaine (XYLOCAINE) 2 % (with pres) injection 400 mg   diazepam (VALIUM) tablet 2.5 mg    Make sure Flumazenil is available in the pyxis when using this medication. If oversedation occurs, administer 0.2 mg IV over 15 sec. If after 45 sec no response, administer 0.2 mg again over 1 min; may repeat at 1 min intervals; not to exceed 4 doses (1 mg)   dexamethasone (DECADRON) injection 10 mg   dexamethasone (DECADRON) injection 10 mg   ropivacaine (PF) 2 mg/mL (0.2%) (NAROPIN) injection 9 mL    Medications administered: We administered lidocaine, diazepam, dexamethasone, dexamethasone, and ropivacaine (PF) 2 mg/mL (0.2%).  See the medical record for exact dosing, route, and time of administration.  Follow-up plan:   Return in about 9 weeks (around 12/08/2021) for Post Procedure Evaluation, virtual.     Left knee genicular NB 06/30/21, 08/11/21; RFA 10/06/21   Recent Visits Date Type Provider Dept  09/15/21 Office Visit Edward Jolly, MD [facility]  08/11/21 Procedure visit Edward Jolly, MD [facility]  07/20/21 Telemedicine Edward Jolly, MD [facility]  Showing recent visits within past 90 days and meeting all other requirements Today's Visits Date Type Provider Dept  10/06/21 Procedure visit Edward Jolly, MD [facility]  Showing today's visits and meeting all other requirements Future Appointments Date Type Provider Dept  12/08/21 Appointment Edward Jolly, MD [facility]  Showing future appointments within next 90 days and meeting all other requirements Disposition: Discharge home  Discharge (Date  Time): 10/06/2021; 1115 hrs.   Primary Care Physician: Duanne Limerick, MD Location: [facility] Note by: Edward Jolly, MD Date: 10/06/2021; Time: 11:35 AM  Disclaimer:  Medicine  is not an exact science.  The only guarantee in medicine is that nothing is guaranteed. It is important to note that the decision to proceed with this intervention was based on the information collected from the patient. The Data and conclusions were drawn from the patient's questionnaire, the interview, and the physical examination. Because the information was provided in large part by the patient, it cannot be guaranteed that it has not been purposely or unconsciously manipulated. Every effort has been made to obtain as much relevant data as possible for this evaluation. It is important to note that the conclusions that lead to this procedure are derived in large part from the available data. Always take into account that the treatment will also be dependent on availability of resources and existing treatment guidelines, considered by other Pain Management Practitioners as being common knowledge and practice, at the time of the intervention. For Medico-Legal purposes, it is also important to point out that variation in procedural techniques and pharmacological choices are the acceptable norm. The indications, contraindications, technique, and results of the above procedure should only be interpreted and judged by a Board-Certified Interventional Pain Specialist with extensive familiarity and expertise in the same exact procedure and technique.

## 2021-10-07 ENCOUNTER — Telehealth: Payer: Self-pay | Admitting: *Deleted

## 2021-10-07 NOTE — Telephone Encounter (Signed)
Called for post procedure check. Denies any issues. 

## 2021-10-19 ENCOUNTER — Ambulatory Visit (INDEPENDENT_AMBULATORY_CARE_PROVIDER_SITE_OTHER): Payer: Medicare Other

## 2021-10-19 ENCOUNTER — Other Ambulatory Visit: Payer: Self-pay

## 2021-10-19 DIAGNOSIS — I351 Nonrheumatic aortic (valve) insufficiency: Secondary | ICD-10-CM

## 2021-10-19 LAB — ECHOCARDIOGRAM COMPLETE
AR max vel: 1.68 cm2
AV Area VTI: 1.77 cm2
AV Area mean vel: 1.77 cm2
AV Mean grad: 15 mmHg
AV Peak grad: 28.3 mmHg
Ao pk vel: 2.66 m/s
Area-P 1/2: 2.79 cm2
Calc EF: 72.3 %
MV M vel: 5.24 m/s
MV Peak grad: 109.8 mmHg
P 1/2 time: 746 msec
Single Plane A2C EF: 77.8 %
Single Plane A4C EF: 68 %

## 2021-11-02 ENCOUNTER — Ambulatory Visit
Payer: Medicare Other | Attending: Student in an Organized Health Care Education/Training Program | Admitting: Student in an Organized Health Care Education/Training Program

## 2021-11-02 ENCOUNTER — Other Ambulatory Visit: Payer: Self-pay

## 2021-11-02 ENCOUNTER — Encounter: Payer: Self-pay | Admitting: Student in an Organized Health Care Education/Training Program

## 2021-11-02 DIAGNOSIS — Z96652 Presence of left artificial knee joint: Secondary | ICD-10-CM | POA: Diagnosis not present

## 2021-11-02 DIAGNOSIS — G8929 Other chronic pain: Secondary | ICD-10-CM | POA: Diagnosis not present

## 2021-11-02 DIAGNOSIS — M25562 Pain in left knee: Secondary | ICD-10-CM

## 2021-11-02 DIAGNOSIS — T8484XS Pain due to internal orthopedic prosthetic devices, implants and grafts, sequela: Secondary | ICD-10-CM

## 2021-11-02 DIAGNOSIS — Z96653 Presence of artificial knee joint, bilateral: Secondary | ICD-10-CM | POA: Diagnosis not present

## 2021-11-02 DIAGNOSIS — G894 Chronic pain syndrome: Secondary | ICD-10-CM

## 2021-11-02 MED ORDER — DICLOFENAC SODIUM 50 MG PO TBEC: 50.0000 mg | DELAYED_RELEASE_TABLET | Freq: Two times a day (BID) | ORAL | 0 refills | Status: AC

## 2021-11-02 NOTE — Progress Notes (Signed)
Patient: [name]  Service Category: E/M  Provider: Gillis Santa, MD  DOB: [birth year]  DOS: 11/02/2021  Location: Office  MRN: [id number]  Setting: Ambulatory outpatient  Referring Provider: Juline Patch, MD  Type: Established Patient  Specialty: Interventional Pain Management  PCP: Juline Patch, MD  Location: Remote location  Delivery: TeleHealth     Virtual Encounter - Pain Management PROVIDER NOTE: Information contained herein reflects review and annotations entered in association with encounter. Interpretation of such information and data should be left to medically-trained personnel. Information provided to patient can be located elsewhere in the medical record under "Patient Instructions". Document created using STT-dictation technology, any transcriptional errors that may result from process are unintentional.    Contact & Pharmacy Preferred: [phone] Home: [phone] (home) Mobile: [phone] (mobile) E-mail: No e-mail address on record  [place], [place] [address] [place] [address] Phone: [phone] Fax: [phone]  [id number] [address] [place]Phone: [phone]: [phone]  Pre-screening  Ms. Common offered "in-person" vs "virtual" encounter. She indicated preferring virtual for this encounter.   Reason COVID-19*   Social distancing based on CDC and AMA recommendations.   I contacted DKerri Percheson 11/02/2021 via telephone.      I clearly identified myself as BGillis Santa MD. I verified that I was speaking with the correct person using two identifiers (Name: DOLIVIANA MCGAHEE and date of birth: 203-Apr-1933.  Consent I sought verbal advanced consent from [name] virtual visit interactions. I informed Ms. CPercivalof possible security and privacy concerns, risks, and limitations associated with providing  "not-in-person" medical evaluation and management services. I also informed Ms. CStodghillof the availability of "in-person" appointments. Finally, I informed her that there would be a charge for the virtual visit and that she could be  personally, fully or partially, financially responsible for it. Ms. CLingenfelterexpressed understanding and agreed to proceed.   Historic Elements   Ms. DALONAH LINEBACKis a 87y.o. year old, female patient evaluated today after our last contact on 10/06/2021. Ms. CValtierra has a past medical history of GERD (gastroesophageal reflux disease), Hypertension, Right knee DJD, and Type II diabetes mellitus ([place]. She also  has a past surgical history that includes Total knee arthroplasty (Left, 03/28/2011); Cervical disc surgery (12/28/2010); Total knee arthroplasty (Right, 08/19/2013); Joint replacement; Back surgery; Total knee arthroplasty (Right, 08/19/2013); and Cataract extraction (Bilateral, 03/2016). Ms. CLiottahas a current medication list which includes the following prescription(s): alprazolam, cyanocobalamin, diclofenac, diclofenac sodium, docusate sodium, glipizide, glucose blood, hydrochlorothiazide, loratadine, metformin, mometasone, montelukast, olmesartan, and omeprazole. She  reports that she has never smoked. She has never used smokeless tobacco. She reports that she does not currently use alcohol. She reports that she does not use drugs. Ms. CLevingsis allergic to nsaids and lisinopril.   HPI  Today, she is being contacted for a post-procedure assessment.   Post-Procedure Evaluation  Procedure(s):    Procedure:          Anesthesia, Analgesia, Anxiolysis:  Type: Therapeutic Superolateral, Superomedial, and Inferomedial, Genicular Nerve Radiofrequency Ablation (destruction).            Region: Lateral, Anterior, and Medial aspects of the knee joint, above and below the knee joint proper. Level: Superior and inferior to the knee joint. Laterality: Left  Anesthesia:  Local (1-2% Lidocaine)  Anxiolysis: Oral  Sedation: Minimal  Guidance: Fluoroscopy           Position: Supine   Indications: 1. Pain due to total left knee replacement, sequela   2. Chronic pain of left knee   3. History of bilateral knee replacement   4. Chronic pain syndrome    Ms. Vanderslice has been dealing with the above chronic pain for longer than three months and has either failed to respond, was unable to tolerate, or simply did not get enough benefit from other more conservative therapies including, but not limited to: 1. Over-the-counter medications 2. Anti-inflammatory medications 3. Muscle relaxants 4. Membrane stabilizers 5. Opioids 6. Physical therapy and/or chiropractic manipulation 7. Modalities (Heat, ice, etc.) 8. Invasive techniques such as nerve blocks. Ms. Kunde has attained more than 50% relief of the pain from a series of diagnostic injections conducted in separate occasions.  Pain Score: Pre-procedure: 3 /10 Post-procedure: 0-No pain/10     Anxiolysis: Please see nurses note.  Effectiveness during initial hour after procedure (Ultra-Short Term Relief): 100 %   Local anesthetic used: Long-acting (4-6 hours) Effectiveness: Defined as any analgesic benefit obtained secondary to the administration of local anesthetics. This carries significant diagnostic value as to the etiological location, or anatomical origin, of the pain. Duration of benefit is expected to coincide with the duration of the local anesthetic used.  Effectiveness during initial 4-6 hours after procedure (Short-Term Relief): 100 %   Long-term benefit: Defined as any relief past the pharmacologic duration of the local anesthetics.  Effectiveness past the initial 6 hours after procedure (Long-Term Relief): 90 % (lasting 2 weeks)   Benefits, current: Defined as benefit present at the time of this evaluation.   Analgesia:  40-50% Function: Somewhat improved    Laboratory Chemistry Profile    Renal Lab Results  Component Value Date   BUN 24 08/25/2021   CREATININE 0.80 08/25/2021   BCR 30 (H) 08/25/2021   GFRAA 91 03/18/2020   GFRNONAA >60 04/13/2021    Hepatic Lab Results  Component Value Date   AST 14 (L) 02/05/2018   ALT 12 (L) 02/05/2018   ALBUMIN 4.0 02/04/2021   ALKPHOS 78 02/05/2018    Electrolytes Lab Results  Component Value Date   NA 140 08/25/2021   K 4.6 08/25/2021   CL 104 08/25/2021   CALCIUM 9.2 08/25/2021   MG 1.7 05/10/2021   PHOS 3.5 02/04/2021    Bone No results found for: VD25OH, VD125OH2TOT, OK5997FS1, SE3953UY2, 25OHVITD1, 25OHVITD2, 25OHVITD3, TESTOFREE, TESTOSTERONE  Inflammation (CRP: Acute Phase) (ESR: Chronic Phase) No results found for: CRP, ESRSEDRATE, LATICACIDVEN       Note: Above Lab results reviewed.  Imaging  ECHOCARDIOGRAM COMPLETE    ECHOCARDIOGRAM REPORT       Patient Name:   DINAH LUPA Date of Exam: 10/19/2021 Medical Rec #:  [id number]       Height:       63.0 in Accession #:    [id number]      Weight:       173.0 lb Date of Birth:  Nov 14, 1932       BSA:          1.818 m Patient Age:    87 years        BP:           108/58 mmHg Patient Gender: F               HR:  76 bpm. Exam Location:  [place]  Procedure: 2D Echo, Cardiac Doppler and Color Doppler  Indications:    I35.1 Nonrheumatic aortic (valve) insufficiency   History:        Patient has prior history of Echocardiogram examinations, most                 recent 04/13/2021. Aortic Valve Disease, Signs/Symptoms:Edema;                 Risk Factors:Hypertension, Diabetes, Dyslipidemia and                 Non-Smoker.   Sonographer:    Caesar Chestnut RDCS, RVT Sonographer#2:  Wilkie Aye RVT Referring Phys: 45 CHRISTOPHER END    Sonographer Comments: Suboptimal parasternal window and Technically challenging study due to limited acoustic windows. IMPRESSIONS   1. Left ventricular ejection fraction, by estimation, is 60 to 65%. The left  ventricle has normal function. The left ventricle has no regional wall motion abnormalities. Left ventricular diastolic parameters are consistent with Grade I diastolic  dysfunction (impaired relaxation).  2. Right ventricular systolic function is normal. The right ventricular size is normal. There is mildly elevated pulmonary artery systolic pressure. The estimated right ventricular systolic pressure is 63.0 mmHg.  3. The mitral valve is normal in structure. Moderate mitral valve regurgitation. No evidence of mitral stenosis.  4. Tricuspid valve regurgitation is moderate.  5. The aortic valve is calcified. Aortic valve regurgitation is mild. Borderline aortic valve stenosis.  6. The inferior vena cava is normal in size with greater than 50% respiratory variability, suggesting right atrial pressure of 3 mmHg.  Comparison(s): LVEF 60-65%.  FINDINGS  Left Ventricle: Left ventricular ejection fraction, by estimation, is 60 to 65%. The left ventricle has normal function. The left ventricle has no regional wall motion abnormalities. The left ventricular internal cavity size was normal in size. There is  no left ventricular hypertrophy. Left ventricular diastolic parameters are consistent with Grade I diastolic dysfunction (impaired relaxation).  Right Ventricle: The right ventricular size is normal. No increase in right ventricular wall thickness. Right ventricular systolic function is normal. There is mildly elevated pulmonary artery systolic pressure. The tricuspid regurgitant velocity is 2.93  m/s, and with an assumed right atrial pressure of 5 mmHg, the estimated right ventricular systolic pressure is 16.0 mmHg.  Left Atrium: Left atrial size was normal in size.  Right Atrium: Right atrial size was normal in size.  Pericardium: There is no evidence of pericardial effusion.  Mitral Valve: The mitral valve is normal in structure. Moderate mitral valve regurgitation. No evidence of mitral valve  stenosis.  Tricuspid Valve: The tricuspid valve is normal in structure. Tricuspid valve regurgitation is moderate . No evidence of tricuspid stenosis.  Aortic Valve: The aortic valve is calcified. Aortic valve regurgitation is mild. Aortic regurgitation PHT measures 746 msec. borderline aortic stenosis is present. Aortic valve mean gradient measures 15.0 mmHg. Aortic valve peak gradient measures 28.3  mmHg. Aortic valve area, by VTI measures 1.77 cm.  Pulmonic Valve: The pulmonic valve was normal in structure. Pulmonic valve regurgitation is not visualized. No evidence of pulmonic stenosis.  Aorta: The aortic root is normal in size and structure.  Venous: The inferior vena cava is normal in size with greater than 50% respiratory variability, suggesting right atrial pressure of 3 mmHg.  IAS/Shunts: No atrial level shunt detected by color flow Doppler.    LEFT VENTRICLE PLAX 2D LVOT diam:     1.90 cm  Diastology LV SV:         95          LV e' medial:    5.22 cm/s LV SV Index:   52          LV E/e' medial:  14.7 LVOT Area:     2.84 cm    LV e' lateral:   8.16 cm/s                            LV E/e' lateral: 9.4   LV Volumes (MOD) LV vol d, MOD A2C: 82.7 ml LV vol d, MOD A4C: 96.9 ml LV vol s, MOD A2C: 18.4 ml LV vol s, MOD A4C: 31.0 ml LV SV MOD A2C:     64.3 ml LV SV MOD A4C:     96.9 ml LV SV MOD BP:      64.1 ml  RIGHT VENTRICLE RV Basal diam:  4.10 cm RV Mid diam:    2.70 cm RV S prime:     23.60 cm/s TAPSE (M-mode): 2.6 cm  LEFT ATRIUM             Index        RIGHT ATRIUM           Index LA Vol (A2C):   63.6 ml 34.98 ml/m  RA Area:     16.60 cm LA Vol (A4C):   42.2 ml 23.21 ml/m  RA Volume:   44.40 ml  24.42 ml/m LA Biplane Vol: 52.1 ml 28.66 ml/m  AORTIC VALVE                     PULMONIC VALVE AV Area (Vmax):    1.68 cm      PV Vmax:       1.54 m/s AV Area (Vmean):   1.77 cm      PV Peak grad:  9.5 mmHg AV Area (VTI):     1.77 cm AV Vmax:            266.00 cm/s AV Vmean:          179.000 cm/s AV VTI:            0.534 m AV Peak Grad:      28.3 mmHg AV Mean Grad:      15.0 mmHg LVOT Vmax:         158.00 cm/s LVOT Vmean:        112.000 cm/s LVOT VTI:          0.334 m LVOT/AV VTI ratio: 0.62 AI PHT:            746 msec   AORTA Ao Root diam: 2.70 cm  MITRAL VALVE               TRICUSPID VALVE MV Area (PHT): 2.79 cm    TR Peak grad:   34.3 mmHg MV Decel Time: 272 msec    TR Vmax:        293.00 cm/s MR Peak grad: 109.8 mmHg MR Mean grad: 78.0 mmHg    SHUNTS MR Vmax:      524.00 cm/s  Systemic VTI:  0.33 m MR Vmean:     418.0 cm/s   Systemic Diam: 1.90 cm MV E velocity: 76.50 cm/s MV A velocity: 82.95 cm/s MV E/A ratio:  0.92  Ida Rogue MD Electronically signed by Ida Rogue MD Signature Date/Time: 10/19/2021/7:06:21 PM  Final    Assessment  The primary encounter diagnosis was Pain due to total left knee replacement, sequela. Diagnoses of Chronic pain of left knee, History of bilateral knee replacement, and Chronic pain syndrome were also pertinent to this visit.  Plan of Care   Ms. [name] has a current medication list which includes the following long-term medication(s): glipizide, hydrochlorothiazide, loratadine, metformin, mometasone, montelukast, olmesartan, and omeprazole.   Patient was doing well after her left genicular nerve RFA up until this past Saturday when she has noted increased stiffness and pain radiating down to her toes.  Up until that point, she states that she was getting around comfortably after the RFA.  Recommend a short course of diclofenac as below.  Patient's medical record does show allergy to NSAIDs but no indicated reaction.  When I asked her if she has a true allergy to NSAIDs, she was unclear.  She states that she has taken naproxen and ibuprofen in the past but it did cause some GI upset.  She is on omeprazole.  Cautioned her on the risks of chronic NSAID therapy but will  leave a short 5-day course for increased left knee pain is reasonable.  Patient instructed to let us know if her knee pain is not improved by next week. Pharmacotherapy (Medications Ordered): Meds ordered this encounter  Medications   diclofenac (VOLTAREN) 50 MG EC tablet    Sig: Take 1 tablet (50 mg total) by mouth 2 (two) times daily for 7 days.    Dispense:  14 tablet    Refill:  0    Orders:  No orders of the defined types were placed in this encounter.  Follow-up plan:   Return if symptoms worsen or fail to improve.     Left knee genicular NB 06/30/21, 08/11/21; RFA 10/06/21    Recent Visits Date Type Provider Dept  10/06/21 Procedure visit Gillis Santa, MD [facility]  09/15/21 Office Visit Gillis Santa, MD [facility]  08/11/21 Procedure visit Gillis Santa, MD [facility]  Showing recent visits within past 90 days and meeting all other requirements Today's Visits Date Type Provider Dept  11/02/21 Office Visit Gillis Santa, MD [facility]  Showing today's visits and meeting all other requirements Future Appointments No visits were found meeting these conditions. Showing future appointments within next 90 days and meeting all other requirements I discussed the assessment and treatment plan with the patient. The patient was provided an opportunity to ask questions and all were answered. The patient agreed with the plan and demonstrated an understanding of the instructions.  Patient advised to call back or seek an in-person evaluation if the symptoms or condition worsens.  Duration of encounter: 26mnutes.  Note by: BGillis Santa MD Date: 11/02/2021; Time: 3:23 PM

## 2021-11-08 ENCOUNTER — Telehealth: Payer: Self-pay

## 2021-11-08 ENCOUNTER — Other Ambulatory Visit: Payer: Self-pay

## 2021-11-08 ENCOUNTER — Ambulatory Visit: Payer: Self-pay | Admitting: *Deleted

## 2021-11-08 DIAGNOSIS — R051 Acute cough: Secondary | ICD-10-CM

## 2021-11-08 MED ORDER — BENZONATATE 100 MG PO CAPS: 100.0000 mg | ORAL_CAPSULE | Freq: Two times a day (BID) | ORAL | 0 refills | Status: DC | PRN

## 2021-11-08 NOTE — Telephone Encounter (Signed)
Pt called in requesting to speak with Diannia Ruder, Dr. Yetta Barre' nurse.   I called into the office and they asked that I send a message because Diannia Ruder was in with a pt. The phone line disconnected.   I called pt back.  I let her know Diannia Ruder was in with a pt and that I've sent a note to her to call you back.  Pt was agreeable to this plan.  She can be reached at [phone].  Notes sent to [facility]

## 2021-11-08 NOTE — Telephone Encounter (Signed)
Pt called in c/o cough- sent in tessalon perles

## 2021-11-08 NOTE — Progress Notes (Signed)
Sent in tessalon perles

## 2021-11-25 ENCOUNTER — Telehealth: Payer: Self-pay

## 2021-11-29 ENCOUNTER — Other Ambulatory Visit: Payer: Self-pay

## 2021-11-29 ENCOUNTER — Ambulatory Visit
Payer: Medicare Other | Attending: Student in an Organized Health Care Education/Training Program | Admitting: Student in an Organized Health Care Education/Training Program

## 2021-11-29 ENCOUNTER — Encounter: Payer: Self-pay | Admitting: Student in an Organized Health Care Education/Training Program

## 2021-11-29 DIAGNOSIS — T8484XS Pain due to internal orthopedic prosthetic devices, implants and grafts, sequela: Secondary | ICD-10-CM | POA: Diagnosis not present

## 2021-11-29 DIAGNOSIS — G894 Chronic pain syndrome: Secondary | ICD-10-CM | POA: Diagnosis not present

## 2021-11-29 DIAGNOSIS — Z96652 Presence of left artificial knee joint: Secondary | ICD-10-CM

## 2021-11-29 DIAGNOSIS — M25562 Pain in left knee: Secondary | ICD-10-CM | POA: Diagnosis not present

## 2021-11-29 DIAGNOSIS — Z96653 Presence of artificial knee joint, bilateral: Secondary | ICD-10-CM

## 2021-11-29 DIAGNOSIS — G8929 Other chronic pain: Secondary | ICD-10-CM

## 2021-11-29 MED ORDER — TIZANIDINE HCL 2 MG PO TABS: 2.0000 mg | ORAL_TABLET | Freq: Two times a day (BID) | ORAL | 0 refills | Status: DC | PRN

## 2021-11-29 NOTE — Progress Notes (Signed)
Patient: [name]  Service Category: E/M  Provider: Gillis Santa, MD  DOB: June 14, 1932  DOS: 11/29/2021  Location: Office  MRN: [id number]  Setting: Ambulatory outpatient  Referring Provider: Juline Patch, MD  Type: Established Patient  Specialty: Interventional Pain Management  PCP: Juline Patch, MD  Location: Home  Delivery: TeleHealth     Virtual Encounter - Pain Management PROVIDER NOTE: Information contained herein reflects review and annotations entered in association with encounter. Interpretation of such information and data should be left to medically-trained personnel. Information provided to patient can be located elsewhere in the medical record under "Patient Instructions". Document created using STT-dictation technology, any transcriptional errors that may result from process are unintentional.    Contact & Pharmacy Preferred: [phone] Home: [phone] (home) Mobile: [phone] (mobile) E-mail: No e-mail address on record  [place], [place] - [address] [place] [address] Phone: [phone] Fax: [phone]  [id number]: [phone]: [phone]  Pre-screening  [name] offered "in-person" vs "virtual" encounter. She indicated preferring virtual for this encounter.   Reason COVID-19*   Social distancing based on CDC and AMA recommendations.   I contacted Madison Percheson 11/29/2021 via telephone.      I clearly identified myself as BGillis Santa MD. I verified that I was speaking with the correct person using two identifiers (Name: [name] and date of birth: [birth year].  Consent I sought verbal advanced consent from [name] virtual visit interactions. I informed Ms. CBrossardof possible security and privacy concerns, risks, and limitations associated with providing "not-in-person"  medical evaluation and management services. I also informed Ms. CMarroneof the availability of "in-person" appointments. Finally, I informed her that there would be a charge for the virtual visit and that she could be  personally, fully or partially, financially responsible for it. Ms. CBacchiexpressed understanding and agreed to proceed.   Historic Elements   Ms. Madison LEINBACHis a 88y.o. year old, female patient evaluated today after our last contact on 11/02/2021. Ms. CSliwinski has a past medical history of GERD (gastroesophageal reflux disease), Hypertension, Right knee DJD, and Type II diabetes mellitus ([place]. She also  has a past surgical history that includes Total knee arthroplasty (Left, 03/28/2011); Cervical disc surgery (12/28/2010); Total knee arthroplasty (Right, 08/19/2013); Joint replacement; Back surgery; Total knee arthroplasty (Right, 08/19/2013); and Cataract extraction (Bilateral, 03/2016). Ms. CRodgerhas a current medication list which includes the following prescription(s): alprazolam, benzonatate, cyanocobalamin, diclofenac sodium, docusate sodium, glipizide, glucose blood, hydrochlorothiazide, loratadine, metformin, mometasone, montelukast, olmesartan, omeprazole, and tizanidine. She  reports that she has never smoked. She has never used smokeless tobacco. She reports that she does not currently use alcohol. She reports that she does not use drugs. Ms. CMorschis allergic to nsaids and lisinopril.   HPI  Today, she is being contacted for worsening of previously known (established) problem  Continues to have persistent left knee pain related to left knee osteoarthritis after left knee replacement.  She is status post left genicular nerve radiofrequency ablation on 10/06/2021 that provided her with moderate pain relief for the first 6 weeks and now the patient is noting return of her left knee pain and is having difficulty bearing weight.  I informed her that we can consider repeating  genicular RFA after 6 months.  In the interim, she  does complain of muscle spasms along her knee cramps.  Recommend nighttime tizanidine and if she tolerates that well can consider a daytime dose as well if she is having tightness and spasms around her knee.  Patient endorsed understanding.  Laboratory Chemistry Profile   Renal Lab Results  Component Value Date   BUN 24 08/25/2021   CREATININE 0.80 08/25/2021   BCR 30 (H) 08/25/2021   GFRAA 91 03/18/2020   GFRNONAA >60 04/13/2021    Hepatic Lab Results  Component Value Date   AST 14 (L) 02/05/2018   ALT 12 (L) 02/05/2018   ALBUMIN 4.0 02/04/2021   ALKPHOS 78 02/05/2018    Electrolytes Lab Results  Component Value Date   NA 140 08/25/2021   K 4.6 08/25/2021   CL 104 08/25/2021   CALCIUM 9.2 08/25/2021   MG 1.7 05/10/2021   PHOS 3.5 02/04/2021    Bone No results found for: VD25OH, VD125OH2TOT, ZO1096EA5, WU9811BJ4, 25OHVITD1, 25OHVITD2, 25OHVITD3, TESTOFREE, TESTOSTERONE  Inflammation (CRP: Acute Phase) (ESR: Chronic Phase) No results found for: CRP, ESRSEDRATE, LATICACIDVEN       Note: Above Lab results reviewed.  Imaging  ECHOCARDIOGRAM COMPLETE    ECHOCARDIOGRAM REPORT       Patient Name:   [name] Date of Exam: 10/19/2021 Medical Rec #:  [id number]       Height:       63.0 in Accession #:    [id number]      Weight:       173.0 lb Date of Birth:  15-Dec-1931       BSA:          1.818 m Patient Age:    67 years        BP:           108/58 mmHg Patient Gender: F               HR:           76 bpm. Exam Location:  [facility]  Procedure: 2D Echo, Cardiac Doppler and Color Doppler  Indications:    I35.1 Nonrheumatic aortic (valve) insufficiency   History:        Patient has prior history of Echocardiogram examinations, most                 recent 04/13/2021. Aortic Valve Disease, Signs/Symptoms:Edema;                 Risk Factors:Hypertension, Diabetes, Dyslipidemia and                 Non-Smoker.    Sonographer:    Caesar Chestnut RDCS, RVT Sonographer#2:  Wilkie Aye RVT Referring Phys: 69 CHRISTOPHER END    Sonographer Comments: Suboptimal parasternal window and Technically challenging study due to limited acoustic windows. IMPRESSIONS   1. Left ventricular ejection fraction, by estimation, is 60 to 65%. The left ventricle has normal function. The left ventricle has no regional wall motion abnormalities. Left ventricular diastolic parameters are consistent with Grade I diastolic  dysfunction (impaired relaxation).  2. Right ventricular systolic function is normal. The right ventricular size is normal. There is mildly elevated pulmonary artery systolic pressure. The estimated right ventricular systolic pressure is 29.5 mmHg.  3. The mitral valve is normal in structure. Moderate mitral valve regurgitation. No evidence of mitral stenosis.  4. Tricuspid valve regurgitation is moderate.  5. The aortic valve is calcified. Aortic valve regurgitation is mild. Borderline aortic valve stenosis.  6. The inferior vena  cava is normal in size with greater than 50% respiratory variability, suggesting right atrial pressure of 3 mmHg.  Comparison(s): LVEF 60-65%.  FINDINGS  Left Ventricle: Left ventricular ejection fraction, by estimation, is 60 to 65%. The left ventricle has normal function. The left ventricle has no regional wall motion abnormalities. The left ventricular internal cavity size was normal in size. There is  no left ventricular hypertrophy. Left ventricular diastolic parameters are consistent with Grade I diastolic dysfunction (impaired relaxation).  Right Ventricle: The right ventricular size is normal. No increase in right ventricular wall thickness. Right ventricular systolic function is normal. There is mildly elevated pulmonary artery systolic pressure. The tricuspid regurgitant velocity is 2.93  m/s, and with an assumed right atrial pressure of 5 mmHg, the estimated right  ventricular systolic pressure is 03.5 mmHg.  Left Atrium: Left atrial size was normal in size.  Right Atrium: Right atrial size was normal in size.  Pericardium: There is no evidence of pericardial effusion.  Mitral Valve: The mitral valve is normal in structure. Moderate mitral valve regurgitation. No evidence of mitral valve stenosis.  Tricuspid Valve: The tricuspid valve is normal in structure. Tricuspid valve regurgitation is moderate . No evidence of tricuspid stenosis.  Aortic Valve: The aortic valve is calcified. Aortic valve regurgitation is mild. Aortic regurgitation PHT measures 746 msec. borderline aortic stenosis is present. Aortic valve mean gradient measures 15.0 mmHg. Aortic valve peak gradient measures 28.3  mmHg. Aortic valve area, by VTI measures 1.77 cm.  Pulmonic Valve: The pulmonic valve was normal in structure. Pulmonic valve regurgitation is not visualized. No evidence of pulmonic stenosis.  Aorta: The aortic root is normal in size and structure.  Venous: The inferior vena cava is normal in size with greater than 50% respiratory variability, suggesting right atrial pressure of 3 mmHg.  IAS/Shunts: No atrial level shunt detected by color flow Doppler.    LEFT VENTRICLE PLAX 2D LVOT diam:     1.90 cm     Diastology LV SV:         95          LV e' medial:    5.22 cm/s LV SV Index:   52          LV E/e' medial:  14.7 LVOT Area:     2.84 cm    LV e' lateral:   8.16 cm/s                            LV E/e' lateral: 9.4   LV Volumes (MOD) LV vol d, MOD A2C: 82.7 ml LV vol d, MOD A4C: 96.9 ml LV vol s, MOD A2C: 18.4 ml LV vol s, MOD A4C: 31.0 ml LV SV MOD A2C:     64.3 ml LV SV MOD A4C:     96.9 ml LV SV MOD BP:      64.1 ml  RIGHT VENTRICLE RV Basal diam:  4.10 cm RV Mid diam:    2.70 cm RV S prime:     23.60 cm/s TAPSE (M-mode): 2.6 cm  LEFT ATRIUM             Index        RIGHT ATRIUM           Index LA Vol (A2C):   63.6 ml 34.98 ml/m  RA Area:      16.60 cm LA Vol (A4C):   42.2 ml 23.21 ml/m  RA  Volume:   44.40 ml  24.42 ml/m LA Biplane Vol: 52.1 ml 28.66 ml/m  AORTIC VALVE                     PULMONIC VALVE AV Area (Vmax):    1.68 cm      PV Vmax:       1.54 m/s AV Area (Vmean):   1.77 cm      PV Peak grad:  9.5 mmHg AV Area (VTI):     1.77 cm AV Vmax:           266.00 cm/s AV Vmean:          179.000 cm/s AV VTI:            0.534 m AV Peak Grad:      28.3 mmHg AV Mean Grad:      15.0 mmHg LVOT Vmax:         158.00 cm/s LVOT Vmean:        112.000 cm/s LVOT VTI:          0.334 m LVOT/AV VTI ratio: 0.62 AI PHT:            746 msec   AORTA Ao Root diam: 2.70 cm  MITRAL VALVE               TRICUSPID VALVE MV Area (PHT): 2.79 cm    TR Peak grad:   34.3 mmHg MV Decel Time: 272 msec    TR Vmax:        293.00 cm/s MR Peak grad: 109.8 mmHg MR Mean grad: 78.0 mmHg    SHUNTS MR Vmax:      524.00 cm/s  Systemic VTI:  0.33 m MR Vmean:     418.0 cm/s   Systemic Diam: 1.90 cm MV E velocity: 76.50 cm/s MV A velocity: 82.95 cm/s MV E/A ratio:  0.92  Madison Rogue MD Electronically signed by Madison Rogue MD Signature Date/Time: 10/19/2021/7:06:21 PM      Final    Assessment  The primary encounter diagnosis was Pain due to total left knee replacement, sequela. Diagnoses of Chronic pain of left knee, History of bilateral knee replacement, and Chronic pain syndrome were also pertinent to this visit.  Plan of Care   Ms. Kerri Reynolds has a current medication list which includes the following long-term medication(s): glipizide, hydrochlorothiazide, loratadine, metformin, mometasone, montelukast, olmesartan, and omeprazole.  Pharmacotherapy (Medications Ordered): Meds ordered this encounter  Medications   tiZANidine (ZANAFLEX) 2 MG tablet    Sig: Take 1 tablet (2 mg total) by mouth every 12 (twelve) hours as needed for muscle spasms.    Dispense:  60 tablet    Refill:  0    Do not place this medication, or any other  prescription from our practice, on "Automatic Refill". Patient may have prescription filled one day early if pharmacy is closed on scheduled refill date.  Recommend knee brace, heat, elevation.  Follow-up plan:   Return if symptoms worsen or fail to improve.     Left knee genicular NB 06/30/21, 08/11/21; RFA 10/06/21     Recent Visits Date Type Provider Dept  11/02/21 Office Visit Gillis Santa, MD [facility]  10/06/21 Procedure visit Gillis Santa, MD [facility]  09/15/21 Office Visit Gillis Santa, MD [facility]  Showing recent visits within past 90 days and meeting all other requirements Today's Visits Date Type Provider Dept  11/29/21 Office Visit Gillis Santa, MD [facility]  Showing today's visits and meeting all other requirements Future Appointments No visits were found meeting these conditions. Showing future appointments within next 90 days and meeting all other requirements  I discussed the assessment and treatment plan with the patient. The patient was provided an opportunity to ask questions and all were answered. The patient agreed with the plan and demonstrated an understanding of the instructions.  Patient advised to call back or seek an in-person evaluation if the symptoms or condition worsens.  Duration of encounter: 46mnutes.  Note by: BGillis Santa MD Date: 11/29/2021; Time: 1:58 PM

## 2021-11-30 ENCOUNTER — Telehealth: Payer: Self-pay

## 2021-11-30 ENCOUNTER — Telehealth: Payer: Self-pay | Admitting: Family Medicine

## 2021-11-30 NOTE — Telephone Encounter (Signed)
Copied from CRM [id number]. Topic: General - Inquiry >> Nov 30, 2021  2:10 PM Crist Infante wrote: Reason for CRM: pt would like [name] to give her a call.  Pt declined to give any other information.

## 2021-11-30 NOTE — Telephone Encounter (Signed)
Called pt back and she wanted to know if the Tizanidine that Dr Cherylann Ratel gave her was appropriate to take. I advised her to take the medication as prescribed, as Dr Cherylann Ratel was treating her knee pain

## 2021-12-08 ENCOUNTER — Telehealth: Payer: Medicare Other | Admitting: Student in an Organized Health Care Education/Training Program

## 2021-12-09 ENCOUNTER — Other Ambulatory Visit: Payer: Self-pay | Admitting: Family Medicine

## 2021-12-09 DIAGNOSIS — E119 Type 2 diabetes mellitus without complications: Secondary | ICD-10-CM

## 2021-12-09 NOTE — Telephone Encounter (Signed)
Requested Prescriptions  Pending Prescriptions Disp Refills   glipiZIDE (GLUCOTROL XL) 2.5 MG 24 hr tablet [Pharmacy Med Name: glipiZIDE ER 2.5 MG Oral Tablet Extended Release 24 Hour] 90 tablet 0    Sig: Take 1 tablet by mouth once daily with breakfast     Endocrinology:  Diabetes - Sulfonylureas Passed - 12/09/2021  6:33 PM      Passed - HBA1C is between 0 and 7.9 and within 180 days    Hgb A1c MFr Bld  Date Value Ref Range Status  09/14/2021 7.5 (H) 4.8 - 5.6 % Final    Comment:             Prediabetes: 5.7 - 6.4          Diabetes: >6.4          Glycemic control for adults with diabetes: <7.0          Passed - Valid encounter within last 6 months    Recent Outpatient Visits          2 months ago Type 2 diabetes mellitus without complication, without long-term current use of insulin (HCC)   [facility] Duanne Limerick, MD   5 months ago Essential hypertension   [facility] Duanne Limerick, MD   6 months ago COVID   [facility] Duanne Limerick, MD   6 months ago Essential hypertension   [facility] Duanne Limerick, MD   7 months ago Dehydration, mild   [facility] Duanne Limerick, MD      Future Appointments            In 1 month Duanne Limerick, MD [facility], [facility]

## 2021-12-29 ENCOUNTER — Encounter: Payer: Self-pay | Admitting: Student in an Organized Health Care Education/Training Program

## 2021-12-29 ENCOUNTER — Other Ambulatory Visit: Payer: Self-pay

## 2021-12-29 ENCOUNTER — Ambulatory Visit
Payer: Medicare Other | Attending: Student in an Organized Health Care Education/Training Program | Admitting: Student in an Organized Health Care Education/Training Program

## 2021-12-29 VITALS — BP 113/69 | HR 92 | Temp 97.2°F | Resp 18 | Ht 63.0 in | Wt 173.0 lb

## 2021-12-29 DIAGNOSIS — M25562 Pain in left knee: Secondary | ICD-10-CM | POA: Insufficient documentation

## 2021-12-29 DIAGNOSIS — G894 Chronic pain syndrome: Secondary | ICD-10-CM | POA: Diagnosis not present

## 2021-12-29 DIAGNOSIS — Z96652 Presence of left artificial knee joint: Secondary | ICD-10-CM | POA: Insufficient documentation

## 2021-12-29 DIAGNOSIS — G8929 Other chronic pain: Secondary | ICD-10-CM | POA: Diagnosis not present

## 2021-12-29 DIAGNOSIS — T8484XS Pain due to internal orthopedic prosthetic devices, implants and grafts, sequela: Secondary | ICD-10-CM | POA: Diagnosis not present

## 2021-12-29 DIAGNOSIS — Z96653 Presence of artificial knee joint, bilateral: Secondary | ICD-10-CM | POA: Diagnosis not present

## 2021-12-29 MED ORDER — GABAPENTIN 100 MG PO CAPS: ORAL_CAPSULE | ORAL | 0 refills | Status: DC

## 2021-12-29 MED ORDER — DICLOFENAC SODIUM 1 % EX GEL: 4.0000 g | Freq: Four times a day (QID) | CUTANEOUS | 99 refills | Status: AC | PRN

## 2021-12-29 NOTE — Progress Notes (Signed)
Safety precautions to be maintained throughout the outpatient stay will include: orient to surroundings, keep bed in low position, maintain call bell within reach at all times, provide assistance with transfer out of bed and ambulation.  

## 2021-12-29 NOTE — Progress Notes (Signed)
PROVIDER NOTE: Information contained herein reflects review and annotations entered in association with encounter. Interpretation of such information and data should be left to medically-trained personnel. Information provided to patient can be located elsewhere in the medical record under "Patient Instructions". Document created using STT-dictation technology, any transcriptional errors that may result from process are unintentional.    Patient: [name]  Service Category: E/M  Provider: Gillis Santa, MD  DOB: 03-Mar-1932  DOS: 12/29/2021  Specialty: Interventional Pain Management  MRN: [id number]  Setting: Ambulatory outpatient  PCP: Madison Patch, MD  Type: Established Patient    Referring Provider: Juline Patch, MD  Location: Office  Delivery: Face-to-face     HPI  [name], a 88 y.o. year old female, is here today because of her Pain due to total left knee replacement, sequela [T84.84XS, Z96.652]. Ms. Code's primary complain today is Knee Pain (L>R) Last encounter: My last encounter with her was on 11/29/2021. Pertinent problems: [name] does not have any pertinent problems on file. Pain Assessment: Severity of Chronic pain is reported as a 3 /10. Location: Knee Left, Right (L>R)/Radiates from left knee down to left leg into toes. Onset: More than a month ago. Quality: Constant, Aching (stiffness). Timing: Constant. Modifying factor(s): Nothing. Vitals:  height is 5' 3"  (1.6 m) and weight is 173 lb (78.5 kg). Her temporal temperature is 97.2 F (36.2 C) (abnormal). Her blood pressure is 113/69 and her pulse is 92. Her respiration is 18 and oxygen saturation is 98%.   Reason for encounter:   Persistent left knee pain.  History of left knee replacement.  Has tried many intra-articular knee steroid injections as well as gel injections.  Has done genicular nerve block with me in the past as well as left genicular nerve RFA which was done 10/06/2021. Recommend patient start  gabapentin as below.  Discontinue tizanidine given lack of analgesic benefit.  ROS  Constitutional: Denies any fever or chills Gastrointestinal: No reported hemesis, hematochezia, vomiting, or acute GI distress Musculoskeletal:  Left greater than right knee pain Neurological: No reported episodes of acute onset apraxia, aphasia, dysarthria, agnosia, amnesia, paralysis, loss of coordination, or loss of consciousness  Medication Review  ALPRAZolam, benzonatate, cyanocobalamin, diclofenac Sodium, docusate sodium, gabapentin, glipiZIDE, glucose blood, hydrochlorothiazide, loratadine, metFORMIN, mometasone, montelukast, olmesartan, and omeprazole  History Review  Allergy: [name] is allergic to nsaids and lisinopril. Drug: [name]  reports no history of drug use. Alcohol:  reports that she does not currently use alcohol. Tobacco:  reports that she has never smoked. She has never used smokeless tobacco. Social: [name]  reports that she has never smoked. She has never used smokeless tobacco. She reports that she does not currently use alcohol. She reports that she does not use drugs. Medical:  has a past medical history of GERD (gastroesophageal reflux disease), Hypertension, Right knee DJD, and Type II diabetes mellitus ([place]). Surgical: [name]  has a past surgical history that includes Total knee arthroplasty (Left, 03/28/2011); Cervical disc surgery (12/28/2010); Total knee arthroplasty (Right, 08/19/2013); Joint replacement; Back surgery; Total knee arthroplasty (Right, 08/19/2013); and Cataract extraction (Bilateral, 03/2016). Family: family history includes Cerebral aneurysm in her father; Diabetes in her mother.  Laboratory Chemistry Profile   Renal Lab Results  Component Value Date   BUN 24 08/25/2021   CREATININE 0.80 08/25/2021   BCR 30 (H) 08/25/2021   GFRAA 91 03/18/2020   GFRNONAA >60 04/13/2021    Hepatic Lab Results  Component  Value Date   AST 14 (L) 02/05/2018    ALT 12 (L) 02/05/2018   ALBUMIN 4.0 02/04/2021   ALKPHOS 78 02/05/2018    Electrolytes Lab Results  Component Value Date   NA 140 08/25/2021   K 4.6 08/25/2021   CL 104 08/25/2021   CALCIUM 9.2 08/25/2021   MG 1.7 05/10/2021   PHOS 3.5 02/04/2021    Bone No results found for: VD25OH, VD125OH2TOT, ZO1096EA5, WU9811BJ4, 25OHVITD1, 25OHVITD2, 25OHVITD3, TESTOFREE, TESTOSTERONE  Inflammation (CRP: Acute Phase) (ESR: Chronic Phase) No results found for: CRP, ESRSEDRATE, LATICACIDVEN       Note: Above Lab results reviewed.  Recent Imaging Review  ECHOCARDIOGRAM COMPLETE    ECHOCARDIOGRAM REPORT       Patient Name:   [name] Date of Exam: 10/19/2021 Medical Rec #:  [id number]       Height:       63.0 in Accession #:    [id number]      Weight:       173.0 lb Date of Birth:  1932-09-30       BSA:          1.818 m Patient Age:    23 years        BP:           108/58 mmHg Patient Gender: F               HR:           76 bpm. Exam Location:  [place]  Procedure: 2D Echo, Cardiac Doppler and Color Doppler  Indications:    I35.1 Nonrheumatic aortic (valve) insufficiency   History:        Patient has prior history of Echocardiogram examinations, most                 recent 04/13/2021. Aortic Valve Disease, Signs/Symptoms:Edema;                 Risk Factors:Hypertension, Diabetes, Dyslipidemia and                 Non-Smoker.   Sonographer:    Caesar Chestnut RDCS, RVT Sonographer#2:  Wilkie Aye RVT Referring Phys: 60 [address] END    Sonographer Comments: Suboptimal parasternal window and Technically challenging study due to limited acoustic windows. IMPRESSIONS   1. Left ventricular ejection fraction, by estimation, is 60 to 65%. The left ventricle has normal function. The left ventricle has no regional wall motion abnormalities. Left ventricular diastolic parameters are consistent with Grade I diastolic  dysfunction (impaired relaxation).  2. Right ventricular  systolic function is normal. The right ventricular size is normal. There is mildly elevated pulmonary artery systolic pressure. The estimated right ventricular systolic pressure is 29.5 mmHg.  3. The mitral valve is normal in structure. Moderate mitral valve regurgitation. No evidence of mitral stenosis.  4. Tricuspid valve regurgitation is moderate.  5. The aortic valve is calcified. Aortic valve regurgitation is mild. Borderline aortic valve stenosis.  6. The inferior vena cava is normal in size with greater than 50% respiratory variability, suggesting right atrial pressure of 3 mmHg.  Comparison(s): LVEF 60-65%.  FINDINGS  Left Ventricle: Left ventricular ejection fraction, by estimation, is 60 to 65%. The left ventricle has normal function. The left ventricle has no regional wall motion abnormalities. The left ventricular internal cavity size was normal in size. There is  no left ventricular hypertrophy. Left ventricular diastolic parameters are consistent with Grade I diastolic dysfunction (impaired relaxation).  Right  Ventricle: The right ventricular size is normal. No increase in right ventricular wall thickness. Right ventricular systolic function is normal. There is mildly elevated pulmonary artery systolic pressure. The tricuspid regurgitant velocity is 2.93  m/s, and with an assumed right atrial pressure of 5 mmHg, the estimated right ventricular systolic pressure is 03.7 mmHg.  Left Atrium: Left atrial size was normal in size.  Right Atrium: Right atrial size was normal in size.  Pericardium: There is no evidence of pericardial effusion.  Mitral Valve: The mitral valve is normal in structure. Moderate mitral valve regurgitation. No evidence of mitral valve stenosis.  Tricuspid Valve: The tricuspid valve is normal in structure. Tricuspid valve regurgitation is moderate . No evidence of tricuspid stenosis.  Aortic Valve: The aortic valve is calcified. Aortic valve regurgitation is  mild. Aortic regurgitation PHT measures 746 msec. borderline aortic stenosis is present. Aortic valve mean gradient measures 15.0 mmHg. Aortic valve peak gradient measures 28.3  mmHg. Aortic valve area, by VTI measures 1.77 cm.  Pulmonic Valve: The pulmonic valve was normal in structure. Pulmonic valve regurgitation is not visualized. No evidence of pulmonic stenosis.  Aorta: The aortic root is normal in size and structure.  Venous: The inferior vena cava is normal in size with greater than 50% respiratory variability, suggesting right atrial pressure of 3 mmHg.  IAS/Shunts: No atrial level shunt detected by color flow Doppler.    LEFT VENTRICLE PLAX 2D LVOT diam:     1.90 cm     Diastology LV SV:         95          LV e' medial:    5.22 cm/s LV SV Index:   52          LV E/e' medial:  14.7 LVOT Area:     2.84 cm    LV e' lateral:   8.16 cm/s                            LV E/e' lateral: 9.4   LV Volumes (MOD) LV vol d, MOD A2C: 82.7 ml LV vol d, MOD A4C: 96.9 ml LV vol s, MOD A2C: 18.4 ml LV vol s, MOD A4C: 31.0 ml LV SV MOD A2C:     64.3 ml LV SV MOD A4C:     96.9 ml LV SV MOD BP:      64.1 ml  RIGHT VENTRICLE RV Basal diam:  4.10 cm RV Mid diam:    2.70 cm RV S prime:     23.60 cm/s TAPSE (M-mode): 2.6 cm  LEFT ATRIUM             Index        RIGHT ATRIUM           Index LA Vol (A2C):   63.6 ml 34.98 ml/m  RA Area:     16.60 cm LA Vol (A4C):   42.2 ml 23.21 ml/m  RA Volume:   44.40 ml  24.42 ml/m LA Biplane Vol: 52.1 ml 28.66 ml/m  AORTIC VALVE                     PULMONIC VALVE AV Area (Vmax):    1.68 cm      PV Vmax:       1.54 m/s AV Area (Vmean):   1.77 cm      PV Peak grad:  9.5 mmHg AV Area (VTI):  1.77 cm AV Vmax:           266.00 cm/s AV Vmean:          179.000 cm/s AV VTI:            0.534 m AV Peak Grad:      28.3 mmHg AV Mean Grad:      15.0 mmHg LVOT Vmax:         158.00 cm/s LVOT Vmean:        112.000 cm/s LVOT VTI:          0.334  m LVOT/AV VTI ratio: 0.62 AI PHT:            746 msec   AORTA Ao Root diam: 2.70 cm  MITRAL VALVE               TRICUSPID VALVE MV Area (PHT): 2.79 cm    TR Peak grad:   34.3 mmHg MV Decel Time: 272 msec    TR Vmax:        293.00 cm/s MR Peak grad: 109.8 mmHg MR Mean grad: 78.0 mmHg    SHUNTS MR Vmax:      524.00 cm/s  Systemic VTI:  0.33 m MR Vmean:     418.0 cm/s   Systemic Diam: 1.90 cm MV E velocity: 76.50 cm/s MV A velocity: 82.95 cm/s MV E/A ratio:  0.92  Ida Rogue MD Electronically signed by Ida Rogue MD Signature Date/Time: 10/19/2021/7:06:21 PM      Final   Note: Reviewed        Physical Exam  General appearance: Well nourished, well developed, and well hydrated. In no apparent acute distress Mental status: Alert, oriented x 3 (person, place, & time)       Respiratory: No evidence of acute respiratory distress Eyes: PERLA Vitals: BP 113/69    Pulse 92    Temp (!) 97.2 F (36.2 C) (Temporal)    Resp 18    Ht 5' 3"  (1.6 m)    Wt 173 lb (78.5 kg)    SpO2 98%    BMI 30.65 kg/m  BMI: Estimated body mass index is 30.65 kg/m as calculated from the following:   Height as of this encounter: 5' 3"  (1.6 m).   Weight as of this encounter: 173 lb (78.5 kg). Ideal: Ideal body weight: 52.4 kg (115 lb 8.3 oz) Adjusted ideal body weight: 62.8 kg (138 lb 8.2 oz)    Lumbar Spine Area Exam  Skin & Axial Inspection: No masses, redness, or swelling Alignment: Symmetrical Functional ROM: Pain restricted ROM       Stability: No instability detected Muscle Tone/Strength: Functionally intact. No obvious neuro-muscular anomalies detected. Sensory (Neurological): Musculoskeletal pain pattern   Gait & Posture Assessment  Ambulation: Patient came in today in a wheel chair Gait: Limited. Using assistive device to ambulate Posture: Difficulty standing up straight, due to pain  Lower Extremity Exam      Side: Right lower extremity   Side: Left lower extremity  Stability: No  instability observed           Stability: No instability observed          Skin & Extremity Inspection: Evidence of prior arthroplastic surgery   Skin & Extremity Inspection: Evidence of prior arthroplastic surgery  Functional ROM: Unrestricted ROM                   Functional ROM: Pain restricted ROM left knee  Muscle Tone/Strength: Functionally intact. No obvious neuro-muscular anomalies detected.   Muscle Tone/Strength: Functionally intact. No obvious neuro-muscular anomalies detected.  Sensory (Neurological): Arthropathic arthralgia            Sensory (Neurological): Arthropathic arthralgia        DTR: Patellar: 0: absent Achilles: deferred today Plantar: deferred today   DTR: Patellar: 0: absent Achilles: deferred today Plantar: deferred today  Palpation: No palpable anomalies   Palpation: No palpable anomalies    Assessment   Status Diagnosis  Persistent Persistent Controlled 1. Pain due to total left knee replacement, sequela   2. Chronic pain of left knee   3. History of bilateral knee replacement   4. Chronic pain syndrome       Plan of Care    [name] has a current medication list which includes the following long-term medication(s): gabapentin, glipizide, hydrochlorothiazide, loratadine, metformin, mometasone, montelukast, olmesartan, and omeprazole.  Pharmacotherapy (Medications Ordered): Meds ordered this encounter  Medications   gabapentin (NEURONTIN) 100 MG capsule    Sig: Take 1 capsule (100 mg total) by mouth at bedtime for 15 days, THEN 2 capsules (200 mg total) at bedtime. Follow written titration schedule.Marland Kitchen    Dispense:  75 capsule    Refill:  0    Fill one day early if pharmacy is closed on scheduled refill date. May substitute for generic if available.   diclofenac Sodium (VOLTAREN) 1 % GEL    Sig: Apply 4 g topically 4 (four) times daily as needed.    Dispense:  350 g    Refill:  PRN    Follow-up plan:   Return in  about 6 weeks (around 02/09/2022) for Medication Management, virtual.     Left knee genicular NB 06/30/21, 08/11/21; RFA 10/06/21      Recent Visits Date Type Provider Dept  11/29/21 Office Visit Madison Santa, MD [facility]  11/02/21 Office Visit Madison Santa, MD [facility]  10/06/21 Procedure visit Madison Santa, MD [facility]  Showing recent visits within past 90 days and meeting all other requirements Today's Visits Date Type Provider Dept  12/29/21 Office Visit Madison Santa, MD [facility]  Showing today's visits and meeting all other requirements Future Appointments Date Type Provider Dept  02/08/22 Appointment Madison Santa, MD [facility]  Showing future appointments within next 90 days and meeting all other requirements  I discussed the assessment and treatment plan with the patient. The patient was provided an opportunity to ask questions and all were answered. The patient agreed with the plan and demonstrated an understanding of the instructions.  Patient advised to call back or seek an in-person evaluation if the symptoms or condition worsens.  Duration of encounter75mnutes.  Note by: BGillis Santa MD Date: 12/29/2021; Time: 2:19 PM

## 2022-01-17 ENCOUNTER — Other Ambulatory Visit: Payer: Self-pay

## 2022-01-17 ENCOUNTER — Ambulatory Visit: Payer: Medicare Other | Admitting: Family Medicine

## 2022-01-17 ENCOUNTER — Encounter: Payer: Self-pay | Admitting: Family Medicine

## 2022-01-17 ENCOUNTER — Telehealth: Payer: Self-pay | Admitting: Family Medicine

## 2022-01-17 ENCOUNTER — Ambulatory Visit (INDEPENDENT_AMBULATORY_CARE_PROVIDER_SITE_OTHER): Payer: Medicare Other | Admitting: Family Medicine

## 2022-01-17 ENCOUNTER — Telehealth: Payer: Self-pay

## 2022-01-17 ENCOUNTER — Other Ambulatory Visit: Payer: Self-pay | Admitting: Student in an Organized Health Care Education/Training Program

## 2022-01-17 VITALS — BP 122/68 | HR 68 | Ht 63.0 in | Wt 174.0 lb

## 2022-01-17 DIAGNOSIS — G8929 Other chronic pain: Secondary | ICD-10-CM

## 2022-01-17 DIAGNOSIS — E7849 Other hyperlipidemia: Secondary | ICD-10-CM

## 2022-01-17 DIAGNOSIS — E119 Type 2 diabetes mellitus without complications: Secondary | ICD-10-CM | POA: Diagnosis not present

## 2022-01-17 DIAGNOSIS — M25562 Pain in left knee: Secondary | ICD-10-CM

## 2022-01-17 DIAGNOSIS — I10 Essential (primary) hypertension: Secondary | ICD-10-CM | POA: Diagnosis not present

## 2022-01-17 DIAGNOSIS — Z96652 Presence of left artificial knee joint: Secondary | ICD-10-CM

## 2022-01-17 MED ORDER — GLIPIZIDE ER 2.5 MG PO TB24: 2.5000 mg | ORAL_TABLET | Freq: Every day | ORAL | 1 refills | Status: DC

## 2022-01-17 MED ORDER — OLMESARTAN MEDOXOMIL 20 MG PO TABS: 20.0000 mg | ORAL_TABLET | Freq: Every day | ORAL | 1 refills | Status: DC

## 2022-01-17 MED ORDER — METFORMIN HCL 500 MG PO TABS: ORAL_TABLET | ORAL | 1 refills | Status: DC

## 2022-01-17 NOTE — Telephone Encounter (Signed)
I called [facility] [phone] to ask about getting pt. A wheelchair. I was told that we could write a RX that said Standard Wheelchair, 174lbs and Dx M17.9/ arthritis of knee. She also stated I needed to include notes to support Dx and demos and fax to [phone]. I called Dr [facility] office and spoke to [place] concerning this, since they are the office that sees her for this Dx. I gave her all info I had, including the Dx. Code and fax to the facility. Norwood Levo stated she will call pt if not possible to do. I asked Runell Gess to call me if there is a problem with Dr Garnett [facility] office

## 2022-01-17 NOTE — Progress Notes (Signed)
Date:  01/17/2022   Name:  [name]   DOB:  1932/01/08   MRN:  [id number]   Chief Complaint: Hypertension and Diabetes  Hypertension This is a chronic problem. The current episode started more than 1 year ago. The problem is controlled. Pertinent negatives include no anxiety, blurred vision, chest pain, headaches, malaise/fatigue, neck pain, orthopnea, palpitations, peripheral edema, PND, shortness of breath or sweats. There are no associated agents to hypertension. There are no known risk factors for coronary artery disease. The current treatment provides moderate improvement. There are no compliance problems.  There is no history of angina, kidney disease, CAD/MI, CVA, heart failure, left ventricular hypertrophy, PVD or retinopathy. There is no history of chronic renal disease, a hypertension causing med or renovascular disease.  Diabetes She presents for her follow-up diabetic visit. She has type 2 diabetes mellitus. There are no hypoglycemic associated symptoms. Pertinent negatives for hypoglycemia include no dizziness, headaches or sweats. Pertinent negatives for diabetes include no blurred vision, no chest pain, no fatigue, no foot paresthesias, no foot ulcerations, no polydipsia, no polyphagia, no polyuria, no visual change, no weakness and no weight loss. There are no hypoglycemic complications. Symptoms are stable. There are no diabetic complications. Pertinent negatives for diabetic complications include no CVA, PVD or retinopathy. Current diabetic treatment includes oral agent (monotherapy). She is compliant with treatment all of the time. Her weight is stable. She is following a generally healthy diet. Meal planning includes avoidance of concentrated sweets. She participates in exercise intermittently. An ACE inhibitor/angiotensin II receptor blocker is being taken.   Lab Results  Component Value Date   NA 140 08/25/2021   K 4.6 08/25/2021   CO2 19 (L) 08/25/2021   GLUCOSE 146  (H) 08/25/2021   BUN 24 08/25/2021   CREATININE 0.80 08/25/2021   CALCIUM 9.2 08/25/2021   EGFR 71 08/25/2021   GFRNONAA >60 04/13/2021   Lab Results  Component Value Date   CHOL 193 02/04/2021   HDL 70 02/04/2021   LDLCALC 108 (H) 02/04/2021   TRIG 82 02/04/2021   CHOLHDL 2.2 07/23/2019   Lab Results  Component Value Date   TSH 1.824 04/13/2021   Lab Results  Component Value Date   HGBA1C 7.5 (H) 09/14/2021   Lab Results  Component Value Date   WBC 5.4 04/13/2021   HGB 10.6 (L) 04/13/2021   HCT 33.3 (L) 04/13/2021   MCV 84.3 04/13/2021   PLT 215 04/13/2021   Lab Results  Component Value Date   ALT 12 (L) 02/05/2018   AST 14 (L) 02/05/2018   ALKPHOS 78 02/05/2018   BILITOT 0.5 02/05/2018   No results found for: 25OHVITD2, 25OHVITD3, VD25OH   Review of Systems  Constitutional:  Negative for fatigue, malaise/fatigue and weight loss.  Eyes:  Negative for blurred vision.  Respiratory:  Negative for shortness of breath.   Cardiovascular:  Negative for chest pain, palpitations, orthopnea and PND.  Endocrine: Negative for polydipsia, polyphagia and polyuria.  Musculoskeletal:  Negative for neck pain.  Neurological:  Negative for dizziness, weakness and headaches.   Patient Active Problem List   Diagnosis Date Noted   Valvular heart disease 07/23/2021   Chronic pain of left knee 06/17/2021   Chronic pain syndrome 06/17/2021   Near syncope 04/12/2021   Pain due to total left knee replacement ([place]) 03/02/2021   Aortic valve regurgitation 10/23/2020   Bradycardia 09/04/2019   PVC's (premature ventricular contractions) 09/04/2019   Leg edema 09/04/2019   Sinus  bradycardia 03/16/2018   PSVT (paroxysmal supraventricular tachycardia) (HCC) 03/16/2018   NSVT (nonsustained ventricular tachycardia) 03/16/2018   Malignant HTN with heart disease, w/o CHF, w/o chronic kidney disease 02/05/2018   Hyperlipidemia 09/24/2015   Diabetes mellitus with no complication ([facility])  54/98/2641   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   Arthritis of knee, degenerative 03/16/2015   Sinus infection 03/16/2015   Diabetes ([place])    Essential hypertension    GERD (gastroesophageal reflux disease)    History of bilateral knee replacement     Allergies  Allergen Reactions   Nsaids    Lisinopril     Cough    Past Surgical History:  Procedure Laterality Date   BACK SURGERY     CATARACT EXTRACTION Bilateral 03/2016   CERVICAL Bromide SURGERY  12/28/2010   "bulging disc; went in on the left side of my neck" (08/19/2013)   JOINT REPLACEMENT     TOTAL KNEE ARTHROPLASTY Left 03/28/2011   Dr Noemi Chapel   TOTAL KNEE ARTHROPLASTY Right 08/19/2013   TOTAL KNEE ARTHROPLASTY Right 08/19/2013   Procedure: TOTAL KNEE ARTHROPLASTY;  Surgeon: Lorn Junes, MD;  Location: [facility];  Service: Orthopedics;  Laterality: Right;    Social History   Tobacco Use   Smoking status: Never   Smokeless tobacco: Never   Tobacco comments:    Smoking cessation materials not required  Vaping Use   Vaping Use: Never used  Substance Use Topics   Alcohol use: Not Currently   Drug use: Never     Medication list has been reviewed and updated.  Current Meds  Medication Sig   cyanocobalamin 100 MCG tablet Take 1 tablet by mouth daily.    diclofenac Sodium (VOLTAREN) 1 % GEL Apply 4 g topically 4 (four) times daily as needed.   docusate sodium (COLACE) 50 MG capsule Take 1 capsule (50 mg total) by mouth 2 (two) times daily.   gabapentin (NEURONTIN) 100 MG capsule Take 1 capsule (100 mg total) by mouth at bedtime for 15 days, THEN 2 capsules (200 mg total) at bedtime. Follow written titration schedule.Marland Kitchen   glipiZIDE (GLUCOTROL XL) 2.5 MG 24 hr tablet Take 1 tablet by mouth once daily with breakfast   glucose blood (ONE TOUCH ULTRA TEST) test strip USE 1 STRIP TO CHECK GLUCOSE ONCE DAILY   hydrochlorothiazide (HYDRODIURIL) 12.5 MG tablet Take 1 tablet (12.5 mg total) by mouth daily.    loratadine (CLARITIN) 10 MG tablet Take 1 tablet (10 mg total) by mouth daily.   metFORMIN (GLUCOPHAGE) 500 MG tablet TAKE 1 TABLET BY MOUTH TWICE DAILY WITH MEALS   mometasone (NASONEX) 50 MCG/ACT nasal spray Place 2 sprays into the nose daily.   montelukast (SINGULAIR) 10 MG tablet Take 1 tablet (10 mg total) by mouth at bedtime.   olmesartan (BENICAR) 20 MG tablet Take 1 tablet (20 mg total) by mouth at bedtime.   omeprazole (PRILOSEC) 20 MG capsule Take 1 capsule (20 mg total) by mouth daily.   [DISCONTINUED] ALPRAZolam (XANAX) 0.25 MG tablet Take 1 tablet (0.25 mg total) by mouth as needed for anxiety. To be taken if anxious prior to procedure    [facility] 2/9 Scores 12/29/2021 10/06/2021 09/14/2021 08/18/2021  PHQ - 2 Score 0 0 0 0  PHQ- 9 Score - - 0 -    GAD 7 : Generalized Anxiety Score 09/14/2021 05/20/2021 04/23/2021 03/12/2021  Nervous, Anxious, on Edge 0 0 0 0  Control/stop worrying 0 0 0 0  Worry too much - different  things 0 0 0 0  Trouble relaxing 0 0 0 0  Restless 0 0 0 0  Easily annoyed or irritable 0 0 0 0  Afraid - awful might happen 0 0 0 0  Total GAD 7 Score 0 0 0 0    BP Readings from Last 3 Encounters:  01/17/22 122/68  12/29/21 113/69  10/06/21 (!) 163/85    Physical Exam Vitals and nursing note reviewed.  Constitutional:      Appearance: She is well-developed.  HENT:     Head: Normocephalic.     Right Ear: Tympanic membrane and external ear normal.     Left Ear: Tympanic membrane and external ear normal.     Nose: Nose normal.  Eyes:     General: Lids are everted, no foreign bodies appreciated. No scleral icterus.       Left eye: No foreign body or hordeolum.     Conjunctiva/sclera: Conjunctivae normal.     Right eye: Right conjunctiva is not injected.     Left eye: Left conjunctiva is not injected.     Pupils: Pupils are equal, round, and reactive to light.  Neck:     Thyroid: No thyromegaly.     Vascular: No JVD.     Trachea: No tracheal deviation.   Cardiovascular:     Rate and Rhythm: Normal rate and regular rhythm.     Heart sounds: Normal heart sounds. No murmur heard.   No friction rub. No gallop.  Pulmonary:     Effort: Pulmonary effort is normal. No respiratory distress.     Breath sounds: Normal breath sounds. No wheezing, rhonchi or rales.  Abdominal:     General: Bowel sounds are normal.     Palpations: Abdomen is soft. There is no mass.     Tenderness: There is no abdominal tenderness. There is no guarding or rebound.  Musculoskeletal:        General: No tenderness. Normal range of motion.     Cervical back: Normal range of motion and neck supple.  Lymphadenopathy:     Cervical: No cervical adenopathy.  Skin:    General: Skin is warm.     Findings: No rash.  Neurological:     Mental Status: She is alert and oriented to person, place, and time.     Cranial Nerves: No cranial nerve deficit.     Deep Tendon Reflexes: Reflexes normal.  Psychiatric:        Mood and Affect: Mood is not anxious or depressed.    Wt Readings from Last 3 Encounters:  01/17/22 174 lb (78.9 kg)  12/29/21 173 lb (78.5 kg)  10/06/21 173 lb (78.5 kg)    BP 122/68    Pulse 68    Ht 5' 3"  (1.6 m)    Wt 174 lb (78.9 kg)    SpO2 96%    BMI 30.82 kg/m   Assessment and Plan:  1. Type 2 diabetes mellitus without complication, without long-term current use of insulin (HCC) Chronic.  Controlled.  Stable.  Continue glipizide XL 12.5 mg once a day and metformin 500 mg 1 twice a day.  We will continue on current dosing him today we will check an A1c and lipid panel.  We will also check CMP for current status of electrolytes and GFR - glipiZIDE (GLUCOTROL XL) 2.5 MG 24 hr tablet; Take 1 tablet (2.5 mg total) by mouth daily with breakfast.  Dispense: 90 tablet; Refill: 1 - metFORMIN (GLUCOPHAGE) 500 MG tablet;  TAKE 1 TABLET BY MOUTH TWICE DAILY WITH MEALS  Dispense: 180 tablet; Refill: 1 - HgB A1c - Lipid Panel With LDL/HDL Ratio  2. Essential  hypertension Chronic.  Controlled.  Stable.  Blood pressure today is 122/68.  Continuance of Benicar 20 mg and avoidance of sodium intake.  Will check CMP for electrolytes and GFR. - olmesartan (BENICAR) 20 MG tablet; Take 1 tablet (20 mg total) by mouth at bedtime.  Dispense: 90 tablet; Refill: 1 - Comprehensive Metabolic Panel (CMET)  3. Familial multiple lipoprotein-type hyperlipidemia Chronic.  Controlled.  Stable.  We will control with diet primarily however we will check lipid panel to see if there is any change in status of lipid panel management. - Lipid Panel With LDL/HDL Ratio

## 2022-01-17 NOTE — Progress Notes (Signed)
Order faxed to [phone] 

## 2022-01-17 NOTE — Patient Instructions (Signed)

## 2022-01-17 NOTE — Telephone Encounter (Signed)
Copied from CRM [id number]. Topic: General - Inquiry >> Jan 17, 2022 12:31 PM Daphine Deutscher D wrote: Reason for CRM:  Pt's daughter [name] called asking if Delice Bison would call her mother.  She said it was about a wheel chair.  She said her mother was in this morning.  CB# [phone]

## 2022-01-18 LAB — COMPREHENSIVE METABOLIC PANEL
ALT: 5 IU/L (ref 0–32)
AST: 11 IU/L (ref 0–40)
Albumin/Globulin Ratio: 1 — ABNORMAL LOW (ref 1.2–2.2)
Albumin: 3.5 g/dL — ABNORMAL LOW (ref 3.6–4.6)
Alkaline Phosphatase: 85 IU/L (ref 44–121)
BUN/Creatinine Ratio: 27 (ref 12–28)
BUN: 17 mg/dL (ref 8–27)
Bilirubin Total: 0.3 mg/dL (ref 0.0–1.2)
CO2: 21 mmol/L (ref 20–29)
Calcium: 9.1 mg/dL (ref 8.7–10.3)
Chloride: 102 mmol/L (ref 96–106)
Creatinine, Ser: 0.62 mg/dL (ref 0.57–1.00)
Globulin, Total: 3.5 g/dL (ref 1.5–4.5)
Glucose: 119 mg/dL — ABNORMAL HIGH (ref 70–99)
Potassium: 4.4 mmol/L (ref 3.5–5.2)
Sodium: 137 mmol/L (ref 134–144)
Total Protein: 7 g/dL (ref 6.0–8.5)
eGFR: 86 mL/min/{1.73_m2} (ref >59)

## 2022-01-18 LAB — LIPID PANEL WITH LDL/HDL RATIO
Cholesterol, Total: 170 mg/dL (ref 100–199)
HDL: 62 mg/dL (ref >39)
LDL Chol Calc (NIH): 97 mg/dL (ref 0–99)
LDL/HDL Ratio: 1.6 ratio (ref 0.0–3.2)
Triglycerides: 57 mg/dL (ref 0–149)
VLDL Cholesterol Cal: 11 mg/dL (ref 5–40)

## 2022-01-18 LAB — HEMOGLOBIN A1C
Est. average glucose Bld gHb Est-mCnc: 140 mg/dL
Hgb A1c MFr Bld: 6.5 % — ABNORMAL HIGH (ref 4.8–5.6)

## 2022-01-24 ENCOUNTER — Telehealth: Payer: Self-pay

## 2022-01-24 NOTE — Telephone Encounter (Signed)
I sent the order last week, as instructed. I will complete whatever needs to be done. ?

## 2022-01-24 NOTE — Telephone Encounter (Signed)
They need to know the reason why she needs a wheelchair. She states they faxed over the documentation twice, but when I asked what fax number she was using it was wrong. She states she will fax it over now. Just wanted to give you a heads up that it needs to be completed for her to get the wheelchair Dr. Cherylann Ratel ordered.  ?

## 2022-01-25 ENCOUNTER — Encounter: Payer: Self-pay | Admitting: Student in an Organized Health Care Education/Training Program

## 2022-01-25 NOTE — Progress Notes (Unsigned)
Patient would benefit from a wheelchair.  She has a mobility limitation that impairs her ability to perform ADLs in her home including changing her clothes, going to the bathroom, cooking for self.  She has difficulty using a crane, crutches, walker.  She can utilize her wheelchair at home and has a caregiver that can provide assistance.  Patient can barely walk 25 feet without having to sit and rest. ?

## 2022-01-28 DIAGNOSIS — M179 Osteoarthritis of knee, unspecified: Secondary | ICD-10-CM | POA: Diagnosis not present

## 2022-02-07 ENCOUNTER — Encounter: Payer: Self-pay | Admitting: Student in an Organized Health Care Education/Training Program

## 2022-02-08 ENCOUNTER — Ambulatory Visit
Payer: Medicare Other | Attending: Student in an Organized Health Care Education/Training Program | Admitting: Student in an Organized Health Care Education/Training Program

## 2022-02-08 ENCOUNTER — Other Ambulatory Visit: Payer: Self-pay

## 2022-02-08 DIAGNOSIS — G8929 Other chronic pain: Secondary | ICD-10-CM | POA: Diagnosis not present

## 2022-02-08 DIAGNOSIS — T8484XS Pain due to internal orthopedic prosthetic devices, implants and grafts, sequela: Secondary | ICD-10-CM | POA: Diagnosis not present

## 2022-02-08 DIAGNOSIS — G894 Chronic pain syndrome: Secondary | ICD-10-CM | POA: Diagnosis not present

## 2022-02-08 DIAGNOSIS — Z96653 Presence of artificial knee joint, bilateral: Secondary | ICD-10-CM | POA: Diagnosis not present

## 2022-02-08 DIAGNOSIS — M25562 Pain in left knee: Secondary | ICD-10-CM

## 2022-02-08 DIAGNOSIS — Z96652 Presence of left artificial knee joint: Secondary | ICD-10-CM | POA: Diagnosis not present

## 2022-02-08 MED ORDER — TIZANIDINE HCL 4 MG PO TABS: 2.0000 mg | ORAL_TABLET | Freq: Two times a day (BID) | ORAL | 0 refills | Status: AC | PRN

## 2022-02-08 NOTE — Progress Notes (Signed)
Patient: [name]  Service Category: E/M  Provider: Gillis Santa, MD  ?DOB: [birth year]  DOS: 02/08/2022  Location: Office  ?MRN: [id number]  Setting: Ambulatory outpatient  Referring Provider: Juline Patch, MD  ?Type: Established Patient  Specialty: Interventional Pain Management  PCP: Madison Patch, MD  ?Location: Remote location  Delivery: TeleHealth    ? ?Virtual Encounter - Pain Management ?PROVIDER NOTE: Information contained herein reflects review and annotations entered in association with encounter. Interpretation of such information and data should be left to medically-trained personnel. Information provided to patient can be located elsewhere in the medical record under "Patient Instructions". Document created using STT-dictation technology, any transcriptional errors that may result from process are unintentional.  ?  ?Contact & Pharmacy ?Preferred: [phone] ?Home: [phone] (home) ?Mobile: [phone] (mobile) ?E-mail: No e-mail address on record  ?[place] [place] ?[address] ?[place] [address] ?Phone: [phone] Fax: [phone] ? ?[id number] [id number]?Phone: [phone]: [phone]?  ?Pre-screening  ?[name] offered "in-person" vs "virtual" encounter. She indicated preferring virtual for this encounter.  ? ?Reason ?COVID-19*  Social distancing based on CDC and AMA recommendations.  ? ?I contacted [name] 02/08/2022 via telephone.      I clearly identified myself as BGillis Santa MD. I verified that I was speaking with the correct person using two identifiers (Name: [name] and date of birth: 86-Mar-1933. ? ?Consent ?I sought verbal advanced consent from [name] virtual visit interactions. I informed [name] possible security and privacy concerns, risks, and limitations associated with providing  "not-in-person" medical evaluation and management services. I also informed [name] the availability of "in-person" appointments. Finally, I informed her that there would be a charge for the virtual visit and that she could be  personally, fully or partially, financially responsible for it. [name].  ? ?Historic Elements   ?[name] a 88y.o. year old, female patient evaluated today after our last contact on 12/29/2021. [name] has a past medical history of GERD (gastroesophageal reflux disease), Hypertension, Right knee DJD, and Type II diabetes mellitus ([place]. She also  has a past surgical history that includes Total knee arthroplasty (Left, 03/28/2011); Cervical disc surgery (12/28/2010); Total knee arthroplasty (Right, 08/19/2013); Joint replacement; Back surgery; Total knee arthroplasty (Right, 08/19/2013); and Cataract extraction (Bilateral, 03/2016). Ms. CDollingerhas a current medication list which includes the following prescription(s): cyanocobalamin, diclofenac sodium, docusate sodium, glipizide, glucose blood, loratadine, metformin, mometasone, montelukast, olmesartan, omeprazole, tizanidine, and hydrochlorothiazide. She  reports that she has never smoked. She has never used smokeless tobacco. She reports that she does not currently use alcohol. She reports that she does not use drugs. [name] allergic to nsaids and lisinopril.  ? ?HPI  ?Today, she is being contacted for medication management. ? ?Patient states that she was not experiencing significant pain relief with gabapentin which was started at her last visit.  She states that she is taking 200 mg at bedtime without any benefit. ?She states that her left knee is stiff when she experiences muscle spasms at times.  She is applying Voltaren gel to the left knee.  We discussed a low-dose muscle relaxer that she can take at night if she is having increased muscle spasms.  I  cautioned her on  the risk of sedation and drowsiness as well as dizziness with this.  If this is not helpful, we can consider repeating RFA for her left genicular nerve anytime after 04/05/2022. ? ?Laboratory Chemistry Profile  ? ?Renal ?Lab Results  ?Component Value Date  ? BUN 17 01/17/2022  ? CREATININE 0.62 01/17/2022  ? BCR 27 01/17/2022  ? GFRAA 91 03/18/2020  ? GFRNONAA >60 04/13/2021  ?  Hepatic ?Lab Results  ?Component Value Date  ? AST 11 01/17/2022  ? ALT 5 01/17/2022  ? ALBUMIN 3.5 (L) 01/17/2022  ? ALKPHOS 85 01/17/2022  ?  ?Electrolytes ?Lab Results  ?Component Value Date  ? NA 137 01/17/2022  ? K 4.4 01/17/2022  ? CL 102 01/17/2022  ? CALCIUM 9.1 01/17/2022  ? MG 1.7 05/10/2021  ? PHOS 3.5 02/04/2021  ?  Bone ?No results found for: [place], [id number], [id number], [id number], 25OHVITD1, 25OHVITD2, 25OHVITD3, TESTOFREE, TESTOSTERONE  ?Inflammation (CRP: Acute Phase) (ESR: Chronic Phase) ?No results found for: CRP, ESRSEDRATE, LATICACIDVEN    ?  ? ?Note: Above Lab results reviewed. ? ?Imaging  ?ECHOCARDIOGRAM COMPLETE ?   ECHOCARDIOGRAM REPORT   ? ?  ? ?Patient Name:   [name] Date of Exam: 10/19/2021 ?Medical Rec #:  [id number]       Height:       63.0 in ?Accession #:    [id number]      Weight:       173.0 lb ?Date of Birth:  October 10, 1932       BSA:          1.818 m? ?Patient Age:    88 years        BP:           108/58 mmHg ?Patient Gender: F               HR:           76 bpm. ?Exam Location:  [facility] ? ?Procedure: 2D Echo, Cardiac Doppler and Color Doppler ? ?Indications:    I35.1 Nonrheumatic aortic (valve) insufficiency ?  ?History:        Patient has prior history of Echocardiogram examinations, most ?                recent 04/13/2021. Aortic Valve Disease, Signs/Symptoms:Edema; ?                Risk Factors:Hypertension, Diabetes, Dyslipidemia and ?                Non-Smoker. ?  ?Sonographer:    [employer], RVT ?Sonographer#2:  Wilkie Aye RVT ?Referring Phys: [id number] ? ?  ?Sonographer Comments: Suboptimal parasternal window and Technically challenging study due to limited acoustic windows. ?IMPRESSIONS ? ? 1. Left ventricular ejection fraction, by estimation, is 60 to 65%. The left ventricle has normal function. The left ventricle has no regional wall motion abnormalities. Left ventricular diastolic parameters are consistent with Grade I diastolic  ?dysfunction (impaired relaxation). ? 2. Right ventricular systolic function is normal. The right ventricular size is normal. There is mildly elevated pulmonary artery systolic pressure. The estimated right ventricular systolic pressure is 67.7 mmHg. ? 3. The mitral valve is normal in structure. Moderate mitral valve regurgitation. No evidence of mitral stenosis. ? 4. Tricuspid valve regurgitation is moderate. ? 5. The aortic valve is calcified. Aortic valve regurgitation is mild. Borderline aortic valve stenosis. ? 6. The inferior vena cava is normal in size with greater than 50% respiratory variability,  suggesting right atrial pressure of 3 mmHg. ? ?Comparison(s): LVEF 60-65%. ? ?FINDINGS ? Left Ventricle: Left ventricular ejection fraction, by estimation, is 60 to 65%. The left ventricle has normal function. The left ventricle has no regional wall motion abnormalities. The left ventricular internal cavity size was normal in size. There is ? no left ventricular hypertrophy. Left ventricular diastolic parameters are consistent with Grade I diastolic dysfunction (impaired relaxation). ? ?Right Ventricle: The right ventricular size is normal. No increase in right ventricular wall thickness. Right ventricular systolic function is normal. There is mildly elevated pulmonary artery systolic pressure. The tricuspid regurgitant velocity is 2.93 ? m/s, and with an assumed right atrial pressure of 5 mmHg, the estimated right ventricular systolic pressure is 14.3 mmHg. ? ?Left Atrium: Left atrial size was normal in size. ? ?Right Atrium:  Right atrial size was normal in size. ? ?Pericardium: There is no evidence of pericardial effusion. ? ?Mitral Valve: The mitral valve is normal in structure. Moderate mitral valve regurgitation. No evidence of mitral va

## 2022-02-12 ENCOUNTER — Encounter (HOSPITAL_COMMUNITY): Payer: Self-pay

## 2022-02-12 ENCOUNTER — Emergency Department (HOSPITAL_COMMUNITY)
Admission: EM | Admit: 2022-02-12 | Discharge: 2022-02-12 | Disposition: A | Discharge status: Home / Self Care | Payer: Medicare Other | Attending: Emergency Medicine | Admitting: Emergency Medicine

## 2022-02-12 ENCOUNTER — Other Ambulatory Visit: Payer: Self-pay

## 2022-02-12 DIAGNOSIS — E119 Type 2 diabetes mellitus without complications: Secondary | ICD-10-CM | POA: Insufficient documentation

## 2022-02-12 DIAGNOSIS — Z743 Need for continuous supervision: Secondary | ICD-10-CM | POA: Diagnosis not present

## 2022-02-12 DIAGNOSIS — Z794 Long term (current) use of insulin: Secondary | ICD-10-CM | POA: Diagnosis not present

## 2022-02-12 DIAGNOSIS — M25562 Pain in left knee: Secondary | ICD-10-CM | POA: Insufficient documentation

## 2022-02-12 DIAGNOSIS — G8929 Other chronic pain: Secondary | ICD-10-CM | POA: Insufficient documentation

## 2022-02-12 DIAGNOSIS — M25561 Pain in right knee: Secondary | ICD-10-CM | POA: Diagnosis not present

## 2022-02-12 DIAGNOSIS — M25569 Pain in unspecified knee: Secondary | ICD-10-CM | POA: Diagnosis not present

## 2022-02-12 DIAGNOSIS — R202 Paresthesia of skin: Secondary | ICD-10-CM | POA: Diagnosis not present

## 2022-02-12 DIAGNOSIS — M25512 Pain in left shoulder: Secondary | ICD-10-CM | POA: Diagnosis not present

## 2022-02-12 DIAGNOSIS — Z7984 Long term (current) use of oral hypoglycemic drugs: Secondary | ICD-10-CM | POA: Diagnosis not present

## 2022-02-12 MED ORDER — HYDROCODONE-ACETAMINOPHEN 5-325 MG PO TABS: 1.0000 | ORAL_TABLET | Freq: Four times a day (QID) | ORAL | 0 refills | Status: DC | PRN

## 2022-02-12 MED ORDER — HYDROCODONE-ACETAMINOPHEN 5-325 MG PO TABS
1.0000 | ORAL_TABLET | Freq: Once | ORAL | Status: AC
  Administered 2022-02-12: 1 via ORAL
  Filled 2022-02-12: qty 1

## 2022-02-12 NOTE — ED Triage Notes (Signed)
Patient BIB [facility] from home. Patient c/o left knee and shoulder pain upon walking. Patient also c/o some tingling bilateral lower extremities. ? ?Vital signs were:  ?126/84 ?104-HR ?98% RA ?179-CBG ?

## 2022-02-12 NOTE — Discharge Instructions (Signed)
Return to ED with any new or worsening symptoms ?Please pick up prescription for medications today.  Please be aware of the conversation with today concerning usage of this medication.  Please be extremely cautious following this medication and take all measures to decrease risk of fall ?Please follow-up with your pain management doctor for your ongoing bilateral knee pain ?You may also purchase lidocaine patches over-the-counter at any pharmacy ?

## 2022-02-12 NOTE — ED Provider Notes (Signed)
?[facility] DEPT ?Provider Note ? ? ?CSN: [id number] ?Arrival date & time: 02/12/22  0827 ? ?  ? ?History ?No chief complaint on file. ? ? ?[name] is a 88 y.o. female with medical history significant for total knee arthroplasty bilaterally, type 2 diabetes, chronic pain of left knee, right knee degenerative joint disease presents to ED complaining of bilateral knee pain as well as tingling in hands.  Patient accompanied by her daughter who provides some of the history.  Patient states that this morning she woke and was unable to walk due to knee pain.  The patient states that her knees are "locked up".  Patient daughter states that for the last 1 month patient is had deterioration in her ability to ambulate.  Patient daughter states that at baseline patient was using walker but for the last 4 weeks has been unable to do so.  Patient daughter continues that for the last 2 weeks the patient has been living with the daughter due to the patient's inability to ambulate without assistance.  Patient recently had wheelchair ordered by pain management doctor. Patient reports that recently patient was placed on gabapentin by her PCP but was taken off of this medication because the medication was not relieving her symptoms.  Patient endorsing tingling to bilateral hands as well as pain of bilateral knees.  Patient states that bilateral hand tingling has been an issue "for some time now".  Patient denies any recent falls, trauma.  ?HPI ? ?  ? ?Home Medications ?Prior to Admission medications   ?Medication Sig Start Date End Date Taking? Authorizing Provider  ?HYDROcodone-acetaminophen (NORCO/VICODIN) 5-325 MG tablet Take 1 tablet by mouth every 6 (six) hours as needed for severe pain. 02/12/22  Yes Azucena Cecil, PA-C  ?cyanocobalamin 100 MCG tablet Take 1 tablet by mouth daily.     [provider]  ?diclofenac Sodium (VOLTAREN) 1 % GEL Apply 4 g topically 4 (four) times  daily as needed. 12/29/21 06/27/22  Gillis Santa, MD  ?docusate sodium (COLACE) 50 MG capsule Take 1 capsule (50 mg total) by mouth 2 (two) times daily. 01/17/19   Juline Patch, MD  ?glipiZIDE (GLUCOTROL XL) 2.5 MG 24 hr tablet Take 1 tablet (2.5 mg total) by mouth daily with breakfast. 01/17/22   Juline Patch, MD  ?glucose blood (ONE TOUCH ULTRA TEST) test strip USE 1 STRIP TO CHECK GLUCOSE ONCE DAILY 05/17/19   Juline Patch, MD  ?hydrochlorothiazide (HYDRODIURIL) 12.5 MG tablet Take 1 tablet (12.5 mg total) by mouth daily. 09/30/21 01/17/22  End, Harrell Gave, MD  ?loratadine (CLARITIN) 10 MG tablet Take 1 tablet (10 mg total) by mouth daily. 05/20/21   Juline Patch, MD  ?metFORMIN (GLUCOPHAGE) 500 MG tablet TAKE 1 TABLET BY MOUTH TWICE DAILY WITH MEALS 01/17/22   Juline Patch, MD  ?mometasone (NASONEX) 50 MCG/ACT nasal spray Place 2 sprays into the nose daily. 02/04/21   Juline Patch, MD  ?montelukast (SINGULAIR) 10 MG tablet Take 1 tablet (10 mg total) by mouth at bedtime. 09/14/21   Juline Patch, MD  ?olmesartan (BENICAR) 20 MG tablet Take 1 tablet (20 mg total) by mouth at bedtime. 01/17/22 01/12/23  Juline Patch, MD  ?omeprazole (PRILOSEC) 20 MG capsule Take 1 capsule (20 mg total) by mouth daily. 09/14/21   Juline Patch, MD  ?tiZANidine (ZANAFLEX) 4 MG tablet Take 0.5-1 tablets (2-4 mg total) by mouth 2 (two) times daily as needed for muscle  spasms. 02/08/22 03/10/22  Gillis Santa, MD  ?   ? ?Allergies    ?Nsaids and Lisinopril   ? ?Review of Systems   ?Review of Systems  ?Musculoskeletal:  Positive for arthralgias.  ?Neurological:   ?     Tingling into bilateral hands  ?All other systems reviewed and are negative. ? ?Physical Exam ?Updated Vital Signs ?BP 135/88   Pulse 87   Temp 98 ?F (36.7 ?C) (Oral)   Resp 18   Ht 5\' 3"  (1.6 m)   Wt 79 kg   SpO2 100%   BMI 30.85 kg/m?  ?Physical Exam ?Vitals and nursing note reviewed.  ?Constitutional:   ?   General: She is not in acute distress. ?    Appearance: She is not ill-appearing, toxic-appearing or diaphoretic.  ?HENT:  ?   Head: Normocephalic and atraumatic.  ?   Nose: Nose normal. No congestion.  ?   Mouth/Throat:  ?   Mouth: Mucous membranes are moist.  ?   Pharynx: Oropharynx is clear.  ?Eyes:  ?   Extraocular Movements: Extraocular movements intact.  ?   Conjunctiva/sclera: Conjunctivae normal.  ?   Pupils: Pupils are equal, round, and reactive to light.  ?Cardiovascular:  ?   Rate and Rhythm: Normal rate and regular rhythm.  ?   Pulses:     ?     Dorsalis pedis pulses are 2+ on the right side and 2+ on the left side.  ?Pulmonary:  ?   Effort: Pulmonary effort is normal.  ?   Breath sounds: Normal breath sounds.  ?Abdominal:  ?   General: Abdomen is flat. Bowel sounds are normal.  ?   Palpations: Abdomen is soft.  ?   Tenderness: There is no abdominal tenderness.  ?Musculoskeletal:  ?   Right shoulder: Normal.  ?   Left shoulder: Tenderness present. No swelling, deformity, effusion or crepitus. Decreased range of motion.  ?   Cervical back: Normal range of motion and neck supple. No tenderness.  ?   Right knee: Swelling and effusion present. No deformity, erythema or ecchymosis. Decreased range of motion. Tenderness present.  ?   Left knee: Swelling and effusion present. No deformity, erythema or ecchymosis. Decreased range of motion. Tenderness present.  ?   Right lower leg: No edema.  ?   Left lower leg: No edema.  ?Skin: ?   General: Skin is warm and dry.  ?   Capillary Refill: Capillary refill takes less than 2 seconds.  ?Neurological:  ?   Mental Status: She is alert and oriented to person, place, and time.  ?   GCS: GCS eye subscore is 4. GCS verbal subscore is 5. GCS motor subscore is 6.  ?   Cranial Nerves: Cranial nerves 2-12 are intact. No cranial nerve deficit.  ?   Sensory: Sensation is intact. No sensory deficit.  ?   Motor: Motor function is intact. No weakness.  ? ? ?ED Results / Procedures / Treatments   ?Labs ?(all labs ordered are  listed, but only abnormal results are displayed) ?Labs Reviewed - No data to display ? ?EKG ?None ? ?Radiology ?No results found. ? ?Procedures ?Procedures  ? ?Medications Ordered in ED ?Medications  ?HYDROcodone-acetaminophen (NORCO/VICODIN) 5-325 MG per tablet 1 tablet (1 tablet Oral Given 02/12/22 1003)  ? ? ?ED Course/ Medical Decision Making/ A&P ?  ?                        ?  Medical Decision Making ? ?88 year old female presents ED for evaluation of bilateral knee pain as well as hand tingling bilaterally.  Please see HPI for further details. ? ?On examination, the patient is afebrile, nontachycardic, not hypoxic.  The patient lung sounds are clear bilaterally.  Patient abdomen soft compressible all 4 quadrants.  Patient endorsing generalized bilateral knee tenderness and stiffness.  Patient has decreased range of motion to bilateral knees.  Patient also complaining of tingling in the hands however this is a known problem for her.  The patient recently started on a course of gabapentin however this was stopped due to the fact that it was not alleviating her symptoms.  ? ?Patient currently being seen by pain management clinic due to chronic pain of left knee.  No narcotic pain medication has been given to the patient most likely because of her age.  I discussed with the patient and her daughter who is at the bedside that in this setting and with resources available along with the time that we have available to treat the patient, most beneficial treatment course at this time would be a narcotic pain medication.  Shared decision-making conversation was had with daughter, son and patient about risks and benefits of narcotic pain medication.  Decision was made that at this time we will start the patient on a low-dose narcotic pain medication.  Patient and daughter at length as well as son who was on the phone were advised that the patient needs to be at home, sitting down when she takes his medication in order to  decrease risk of falls.  Both patient and daughter voiced understanding of these instructions. ? ?Patient was given one-time low-dose hydrocodone medication here.  Patient reported slight relief of pain.  Patient s

## 2022-02-15 ENCOUNTER — Telehealth: Payer: Self-pay

## 2022-02-15 ENCOUNTER — Other Ambulatory Visit: Payer: Self-pay

## 2022-02-15 DIAGNOSIS — T148XXA Other injury of unspecified body region, initial encounter: Secondary | ICD-10-CM

## 2022-02-15 NOTE — Telephone Encounter (Signed)
Called with appt for wound care clinic 03/04/22 @ 10:00- be there at [phone]-spoke to [place] and gave appt to her ?

## 2022-02-15 NOTE — Progress Notes (Signed)
Ref put in to Wound care clinic ?

## 2022-03-04 ENCOUNTER — Encounter: Payer: Medicare Other | Attending: Physician Assistant | Admitting: Physician Assistant

## 2022-03-04 DIAGNOSIS — L89323 Pressure ulcer of left buttock, stage 3: Secondary | ICD-10-CM | POA: Diagnosis not present

## 2022-03-04 DIAGNOSIS — L98412 Non-pressure chronic ulcer of buttock with fat layer exposed: Secondary | ICD-10-CM | POA: Diagnosis not present

## 2022-03-04 DIAGNOSIS — I2510 Atherosclerotic heart disease of native coronary artery without angina pectoris: Secondary | ICD-10-CM | POA: Insufficient documentation

## 2022-03-04 DIAGNOSIS — I10 Essential (primary) hypertension: Secondary | ICD-10-CM | POA: Insufficient documentation

## 2022-03-04 DIAGNOSIS — E11622 Type 2 diabetes mellitus with other skin ulcer: Secondary | ICD-10-CM | POA: Diagnosis not present

## 2022-03-04 NOTE — Progress Notes (Addendum)
RENESHA, LIZAMA ([id number]) ?Visit Report for 03/04/2022 ?Chief Complaint Document Details ?Patient Name: [name], [name]. ?Date of Service: 03/04/2022 10:00 AM ?Medical Record Number: [id number] ?Patient Account Number: [id number] ?Date of Birth/Sex: 08/13/1932 (88 y.o. F) ?Treating RN: Donnamarie Poag ?Primary Care Provider: Otilio Miu Other Clinician: ?Referring Provider: Otilio Miu ?Treating Provider/Extender: Jeri Cos ?Weeks in Treatment: 0 ?Information Obtained from: Patient ?Chief Complaint ?Left buttock ulcer ?Electronic Signature(s) ?Signed: 03/04/2022 10:53:20 AM By: Worthy Keeler PA-C ?Entered By: Worthy Keeler on 03/04/2022 10:53:20 ?JAHANNA, RAETHER ([id number]) ?-------------------------------------------------------------------------------- ?Debridement Details ?Patient Name: [name]. ?Date of Service: 03/04/2022 10:00 AM ?Medical Record Number: [id number] ?Patient Account Number: [id number] ?Date of Birth/Sex: [birth year] (88 y.o. F) ?Treating RN: Donnamarie Poag ?Primary Care Provider: Otilio Miu Other Clinician: ?Referring Provider: Otilio Miu ?Treating Provider/Extender: Jeri Cos ?Weeks in Treatment: 0 ?Debridement Performed for ?Wound #1 Left Gluteus ?Assessment: ?Performed By: Physician Tommie Sams., PA-C ?Debridement Type: Debridement ?Level of Consciousness (Pre- ?Awake and Alert ?procedure): ?Pre-procedure Verification/Time Out ?Yes - 10:35 ?Taken: ?Start Time: 10:36 ?Pain Control: Lidocaine ?Total Area Debrided (L x W): 0.5 (cm) x 0.4 (cm) = 0.2 (cm?) ?Tissue and other material ?Viable, Non-Viable, Skin: Dermis ?debrided: ?Level: Skin/Dermis ?Debridement Description: Selective/Open Wound ?Instrument: Curette ?Bleeding: Minimum ?Hemostasis Achieved: Pressure ?Response to Treatment: Procedure was tolerated well ?Level of Consciousness (Post- ?Awake and Alert ?procedure): ?Post Debridement Measurements of Total Wound ?Length: (cm) 0.6 ?Stage: Category/Stage III ?Width: (cm)  0.5 ?Depth: (cm) 0.1 ?Volume: (cm?) 0.024 ?Character of Wound/Ulcer Post Debridement: Improved ?Post Procedure Diagnosis ?Same as Pre-procedure ?Electronic Signature(s) ?Signed: 03/04/2022 11:51:43 AM By: Donnamarie Poag ?Signed: 03/04/2022 4:44:12 PM By: Worthy Keeler PA-C ?Entered ByDonnamarie Poag on 03/04/2022 10:38:57 ?KYAN, GIANNONE ([id number]) ?-------------------------------------------------------------------------------- ?HPI Details ?Patient Name: [name]. ?Date of Service: 03/04/2022 10:00 AM ?Medical Record Number: [id number] ?Patient Account Number: [id number] ?Date of Birth/Sex: 04/29/1932 (88 y.o. F) ?Treating RN: Donnamarie Poag ?Primary Care Provider: Otilio Miu Other Clinician: ?Referring Provider: Otilio Miu ?Treating Provider/Extender: Jeri Cos ?Weeks in Treatment: 0 ?History of Present Illness ?HPI Description: 03-04-2022 upon evaluation today patient appears to be doing poorly in regard to her gluteal region. Its mainly in the left ?gluteal region where there is an open ulceration at this point. This does appear to be a stage III pressure ulcer. Fortunately there does not appear ?to be any evidence of active infection at this site locally nor systemically which is also news. The patient has been counseled by her daughter who ?is present with her today her son was also on the phone we had a good discussion at this point. Nonetheless they have been trying to get her to ?not sit as long as they knew that was the major issue the patient tells me however she has a hard time getting up because of her knee. She has ?had of extensive work-up and treatment for the knee and fortunately has been determined this is just arthritis and there is really not much more ?that they can do. Nonetheless I think she is going to have to get up and move around or at least get up and stand and allow for blood flow into ?the gluteal area or out she is can end up with more significant wounds than what we have and  see right now. ?Patient does have a history of diabetes mellitus type 2, hypertension, and coronary artery disease. This wound has been present for at this point ?roughly 1 month beginning on or around February 04, 2022. ?Electronic Signature(s) ?Signed: 03/04/2022 2:51:16 PM By: Worthy Keeler PA-C ?Entered By: Worthy Keeler on 03/04/2022 14:51:16 ?DEVAN, BABINO ([id number]) ?-------------------------------------------------------------------------------- ?Physical Exam Details ?Patient Name: [name]. ?Date of Service: 03/04/2022 10:00 AM ?Medical Record Number: [id number] ?Patient Account Number: [id number] ?Date of Birth/Sex: 06/05/1932 (88 y.o. F) ?Treating RN: Donnamarie Poag ?Primary Care Provider: Otilio Miu Other Clinician: ?Referring Provider: Otilio Miu ?Treating Provider/Extender: Jeri Cos ?Weeks in Treatment: 0 ?Constitutional ?patient is hypertensive.. pulse regular and within target range for patient.Marland Kitchen respirations regular, non-labored and within target range for patient.. ?temperature within target range for patient.. Well-nourished and well-hydrated in no acute distress. ?Eyes ?conjunctiva clear no eyelid edema noted. pupils equal round and reactive to light and accommodation. ?Ears, Nose, Mouth, and Throat ?no gross abnormality of ear auricles or external auditory canals. normal hearing noted during conversation. mucus membranes moist. ?Respiratory ?normal breathing without difficulty. ?Musculoskeletal ?Patient unable to walk without assistance. no significant deformity or arthritic changes, no loss or range of motion, no clubbing. ?Psychiatric ?this patient is able to make decisions and demonstrates good insight into disease process. Alert and Oriented x 3. pleasant and cooperative. ?Notes ?Upon inspection patient's wound bed actually showed signs of some dry skin output around the edges of the wound. This is going require little bit ?of sharp debridement to try to clear this away.  Fortunately I was able to remove this quite well without complication also remove some slough and ?biofilm from the surface of the wound patient tolerated this with minimal discomfort and postdebridement the wound bed appears to be doing ?better. I am very pleased in that regard. ?Electronic Signature(s) ?Signed: 03/04/2022 2:51:49 PM By: Worthy Keeler PA-C ?Entered By: Worthy Keeler on 03/04/2022 14:51:49 ?ORI, KREITER ([id number]) ?-------------------------------------------------------------------------------- ?Physician Orders Details ?Patient Name: MARISUE, CANION. ?Date of Service: 03/04/2022 10:00 AM ?Medical Record Number: [id number] ?Patient Account Number: [id number] ?Date of Birth/Sex: 1932/08/18 (88 y.o. F) ?Treating RN: Donnamarie Poag ?Primary Care Provider: Otilio Miu Other Clinician: ?Referring Provider: Otilio Miu ?Treating Provider/Extender: Jeri Cos ?Weeks in Treatment: 0 ?Verbal / Phone Orders: No ?Diagnosis Coding ?Follow-up Appointments ?o Return Appointment in 1 week. - or next available-dressings done at home ?Bathing/ Shower/ Hygiene ?o Wash wounds with antibacterial soap and water. - change dressing after shower ?Anesthetic (Use 'Patient Medications' Section for Anesthetic Order Entry) ?o Lidocaine applied to wound bed ?Off-Loading ?o Gel wheelchair cushion ?o Turn and reposition every 2 hours ?Additional Orders / Instructions ?o Follow Nutritious Diet and Increase Protein Intake - monitor blood sugar also ?Wound Treatment ?Wound #1 - Gluteus Wound Laterality: Left ?Cleanser: [employer] Ancillary Kit - 15 Day Supply (DME) (Generic) 1 x Per Day/15 Days ?Discharge Instructions: Use supplies as instructed; Kit contains: (15) Saline Bullets; (15) 3x3 Gauze; 15 pr Gloves ?Cleanser: Soap and Water 1 x Per Day/15 Days ?Discharge Instructions: Gently cleanse wound with antibacterial soap, rinse and pat dry prior to dressing wounds ?Cleanser: Wound Cleanser 1 x Per Day/15  Days ?Discharge Instructions: Wash your hands with soap and water. Remove old dressing, discard into plastic bag and place into trash. ?Cleanse the wound with Wound Cleanser prior to applying a clean dressing using gauz

## 2022-03-04 NOTE — Progress Notes (Signed)
[name], [name] ([id number]) ?Visit Report for 03/04/2022 ?Allergy List Details ?Patient Name: [name], [name]. ?Date of Service: 03/04/2022 10:00 AM ?Medical Record Number: [id number] ?Patient Account Number: [id number] ?Date of Birth/Sex: 10/28/1932 (88 y.o. F) ?Treating RN: Donnamarie Poag ?Primary Care Maurine Mowbray: Otilio Miu Other Clinician: ?Referring Helayne Metsker: Otilio Miu ?Treating Kandance Yano/Extender: Jeri Cos ?Weeks in Treatment: 0 ?Allergies ?Active Allergies ?NSAIDS (Non-Steroidal Anti-Inflammatory Drug) ?lisinopril ?Allergy Notes ?Electronic Signature(s) ?Signed: 03/04/2022 11:51:43 AM By: Donnamarie Poag ?Entered ByDonnamarie Poag on 03/04/2022 10:21:39 ?[name], [name] ([id number]) ?-------------------------------------------------------------------------------- ?Arrival Information Details ?Patient Name: [name]. ?Date of Service: 03/04/2022 10:00 AM ?Medical Record Number: [id number] ?Patient Account Number: [id number] ?Date of Birth/Sex: [birth year] (88 y.o. F) ?Treating RN: Donnamarie Poag ?Primary Care Verner Kopischke: Otilio Miu Other Clinician: ?Referring Chaunce Winkels: Otilio Miu ?Treating Kazimierz Springborn/Extender: Jeri Cos ?Weeks in Treatment: 0 ?Visit Information ?Patient Arrived: Wheel Chair ?Arrival Time: 10:12 ?Accompanied By: daughter ?Transfer Assistance: Manual ?Patient Identification Verified: Yes ?Secondary Verification Process Completed: Yes ?Patient Requires Transmission-Based Precautions: No ?Patient Has Alerts: Yes ?Patient Alerts: DIABETIC ?Electronic Signature(s) ?Signed: 03/04/2022 11:51:43 AM By: Donnamarie Poag ?Entered ByDonnamarie Poag on 03/04/2022 10:16:31 ?[name], [name] ([id number]) ?-------------------------------------------------------------------------------- ?Clinic Level of Care Assessment Details ?Patient Name: [name], [name]. ?Date of Service: 03/04/2022 10:00 AM ?Medical Record Number: [id number] ?Patient Account Number: [id number] ?Date of Birth/Sex: [birth year] (88 y.o. F) ?Treating RN:  Donnamarie Poag ?Primary Care Secundino Ellithorpe: Otilio Miu Other Clinician: ?Referring Arpan Eskelson: Otilio Miu ?Treating Jary Louvier/Extender: Jeri Cos ?Weeks in Treatment: 0 ?Clinic Level of Care Assessment Items ?TOOL 1 Quantity Score ?[]  - Use when EandM and Procedure is performed on INITIAL visit 0 ?ASSESSMENTS - Nursing Assessment / Reassessment ?X - General Physical Exam (combine w/ comprehensive assessment (listed just below) when performed on new ?1 20 ?pt. evals) ?X- 1 25 ?Comprehensive Assessment (HX, ROS, Risk Assessments, Wounds Hx, etc.) ?ASSESSMENTS - Wound and Skin Assessment / Reassessment ?[]  - Dermatologic / Skin Assessment (not related to wound area) 0 ?ASSESSMENTS - Ostomy and/or Continence Assessment and Care ?[]  - Incontinence Assessment and Management 0 ?[]  - 0 ?Ostomy Care Assessment and Management (repouching, etc.) ?PROCESS - Coordination of Care ?X - Simple Patient / Family Education for ongoing care 1 15 ?[]  - 0 ?Complex (extensive) Patient / Family Education for ongoing care ?[]  - 0 ?Staff obtains Consents, Records, Test Results / Process Orders ?[]  - 0 ?Staff telephones HHA, Nursing Homes / Clarify orders / etc ?[]  - 0 ?Routine Transfer to another Facility (non-emergent condition) ?[]  - 0 ?Routine Hospital Admission (non-emergent condition) ?X- 1 15 ?New Admissions / Biomedical engineer / Ordering NPWT, Apligraf, etc. ?[]  - 0 ?Emergency Hospital Admission (emergent condition) ?PROCESS - Special Needs ?[]  - Pediatric / Minor Patient Management 0 ?[]  - 0 ?Isolation Patient Management ?[]  - 0 ?Hearing / Language / Visual special needs ?[]  - 0 ?Assessment of Community assistance (transportation, D/C planning, etc.) ?[]  - 0 ?Additional assistance / Altered mentation ?X- 1 15 ?Support Surface(s) Assessment (bed, cushion, seat, etc.) ?INTERVENTIONS - Miscellaneous ?[]  - External ear exam 0 ?[]  - 0 ?Patient Transfer (multiple staff / Civil Service fast streamer / Similar devices) ?[]  - 0 ?Simple Staple / Suture  removal (25 or less) ?[]  - 0 ?Complex Staple / Suture removal (26 or more) ?[]  - 0 ?Hypo/Hyperglycemic Management (do not check if billed separately) ?[]  - 0 ?Ankle / Brachial Index (ABI) - do not check if billed separately ?Has the patient been seen at the hospital within the last three years: Yes ?Total Score: 90 ?  Level Of Care: New/Established - Level ?3 ?[name], [name] ([id number]) ?Electronic Signature(s) ?Signed: 03/04/2022 11:51:43 AM By: Donnamarie Poag ?Entered ByDonnamarie Poag on 03/04/2022 10:42:41 ?[name], [name] ([id number]) ?-------------------------------------------------------------------------------- ?Encounter Discharge Information Details ?Patient Name: [name], [name]. ?Date of Service: 03/04/2022 10:00 AM ?Medical Record Number: [id number] ?Patient Account Number: [id number] ?Date of Birth/Sex: 03/02/1932 (88 y.o. F) ?Treating RN: Donnamarie Poag ?Primary Care Sheika Coutts: Otilio Miu Other Clinician: ?Referring Vontae Court: Otilio Miu ?Treating Sayaka Hoeppner/Extender: Jeri Cos ?Weeks in Treatment: 0 ?Encounter Discharge Information Items Post Procedure Vitals ?Discharge Condition: Stable ?Temperature (?F): 97.8 ?Ambulatory Status: Wheelchair ?Pulse (bpm): 82 ?Discharge Destination: Home ?Respiratory Rate (breaths/min): 16 ?Transportation: Private Auto ?Blood Pressure (mmHg): 160/71 ?Accompanied By: daughter ?Schedule Follow-up Appointment: Yes ?Clinical Summary of Care: ?Electronic Signature(s) ?Signed: 03/04/2022 11:51:43 AM By: Donnamarie Poag ?Entered ByDonnamarie Poag on 03/04/2022 10:57:05 ?[name], [name] ([id number]) ?-------------------------------------------------------------------------------- ?Lower Extremity Assessment Details ?Patient Name: [name], [name]. ?Date of Service: 03/04/2022 10:00 AM ?Medical Record Number: [id number] ?Patient Account Number: [id number] ?Date of Birth/Sex: 1932-10-15 (88 y.o. F) ?Treating RN: Donnamarie Poag ?Primary Care Marrissa Dai: Otilio Miu Other Clinician: ?Referring  Jonell Krontz: Otilio Miu ?Treating Ashna Dorough/Extender: Jeri Cos ?Weeks in Treatment: 0 ?Electronic Signature(s) ?Signed: 03/04/2022 11:51:43 AM By: Donnamarie Poag ?Entered ByDonnamarie Poag on 03/04/2022 10:24:18 ?[name], [name] ([id number]) ?-------------------------------------------------------------------------------- ?Multi Wound Chart Details ?Patient Name: [name], [name]. ?Date of Service: 03/04/2022 10:00 AM ?Medical Record Number: [id number] ?Patient Account Number: [id number] ?Date of Birth/Sex: [birth year] (88 y.o. F) ?Treating RN: Donnamarie Poag ?Primary Care Izadora Roehr: Otilio Miu Other Clinician: ?Referring Lena Gores: Otilio Miu ?Treating Treasure Ingrum/Extender: Jeri Cos ?Weeks in Treatment: 0 ?Vital Signs ?Height(in): 63 ?Pulse(bpm): 82 ?Weight(lbs): 174 ?Blood Pressure(mmHg): 160/71 ?Body Mass Index(BMI): 30.8 ?Temperature(??F): 97.8 ?Respiratory Rate(breaths/min): 16 ?Photos: [N/A:N/A] ?Wound Location: Left Gluteus N/A N/A ?Wounding Event: Gradually Appeared N/A N/A ?Primary Etiology: Pressure Ulcer N/A N/A ?Comorbid History: Arrhythmia, Coronary Artery N/A N/A ?Disease, Hypertension, Type II ?Diabetes, Osteoarthritis ?Date Acquired: 02/04/2022 N/A N/A ?Weeks of Treatment: 0 N/A N/A ?Wound Status: Open N/A N/A ?Wound Recurrence: No N/A N/A ?Measurements L x W x D (cm) 0.5x0.4x0.1 N/A N/A ?Area (cm?) : 0.157 N/A N/A ?Volume (cm?) : 0.016 N/A N/A ?Classification: Category/Stage III N/A N/A ?Exudate Amount: Medium N/A N/A ?Exudate Type: Serosanguineous N/A N/A ?Exudate Color: red, brown N/A N/A ?Granulation Amount: Large (67-100%) N/A N/A ?Granulation Quality: Red, Pink N/A N/A ?Necrotic Amount: Small (1-33%) N/A N/A ?Necrotic Tissue: Eschar, Adherent Slough N/A N/A ?Exposed Structures: ?Fat Layer (Subcutaneous Tissue): N/A N/A ?Yes ?Fascia: No ?Tendon: No ?Muscle: No ?Joint: No ?Bone: No ?Treatment Notes ?Electronic Signature(s) ?Signed: 03/04/2022 11:51:43 AM By: Donnamarie Poag ?Entered ByDonnamarie Poag on  03/04/2022 10:36:33 ?KATRIEL, WARZECHA ([id number]) ?-------------------------------------------------------------------------------- ?Multi-Disciplinary Care Plan Details ?Patient Name: JAQUITTA, HEADDEN. ?Date of

## 2022-03-04 NOTE — Progress Notes (Signed)
SAHARRA, SANTO ([id number]) ?Visit Report for 03/04/2022 ?Abuse Risk Screen Details ?Patient Name: [name], [name]. ?Date of Service: 03/04/2022 10:00 AM ?Medical Record Number: [id number] ?Patient Account Number: [id number] ?Date of Birth/Sex: [birth year] (88 y.o. F) ?Treating RN: Hansel Feinstein ?Primary Care Toni Hoffmeister: Elizabeth Sauer Other Clinician: ?Referring Ione Sandusky: Elizabeth Sauer ?Treating Lynnette Pote/Extender: Allen Derry ?Weeks in Treatment: 0 ?Abuse Risk Screen Items ?Answer ?ABUSE RISK SCREEN: ?Has anyone close to you tried to hurt or harm you recentlyo No ?Do you feel uncomfortable with anyone in your familyo No ?Has anyone forced you do things that you didnot want to doo No ?Electronic Signature(s) ?Signed: 03/04/2022 11:51:43 AM By: Hansel Feinstein ?Entered ByHansel Feinstein on 03/04/2022 10:23:43 ?[name], [name] ([id number]) ?-------------------------------------------------------------------------------- ?Activities of Daily Living Details ?Patient Name: [name], [name]. ?Date of Service: 03/04/2022 10:00 AM ?Medical Record Number: [id number] ?Patient Account Number: [id number] ?Date of Birth/Sex: 1932-05-06 (88 y.o. F) ?Treating RN: Hansel Feinstein ?Primary Care Gavriel Holzhauer: Elizabeth Sauer Other Clinician: ?Referring Jashanti Clinkscale: Elizabeth Sauer ?Treating Kavontae Pritchard/Extender: Allen Derry ?Weeks in Treatment: 0 ?Activities of Daily Living Items ?Answer ?Activities of Daily Living (Please select one for each item) ?Drive Automobile Not Able ?Take Medications Need Assistance ?Use Telephone Need Assistance ?Care for Appearance Need Assistance ?Use Toilet Need Assistance ?Bath / Shower Need Assistance ?Dress Self Need Assistance ?Feed Self Need Assistance ?Walk Need Assistance ?Get In / Out Bed Need Assistance ?Housework Need Assistance ?Prepare Meals Need Assistance ?Handle Money Need Assistance ?Shop for Self Not Able ?Electronic Signature(s) ?Signed: 03/04/2022 11:51:43 AM By: Hansel Feinstein ?Entered ByHansel Feinstein on 03/04/2022  10:22:14 ?[name], [name] ([id number]) ?-------------------------------------------------------------------------------- ?Education Screening Details ?Patient Name: [name], [name]. ?Date of Service: 03/04/2022 10:00 AM ?Medical Record Number: [id number] ?Patient Account Number: [id number] ?Date of Birth/Sex: [birth year] (88 y.o. F) ?Treating RN: Hansel Feinstein ?Primary Care Saraiah Bhat: Elizabeth Sauer Other Clinician: ?Referring Kayona Foor: Elizabeth Sauer ?Treating Jeslie Lowe/Extender: Allen Derry ?Weeks in Treatment: 0 ?Primary Learner Assessed: Patient ?Learning Preferences/Education Level/Primary Language ?Learning Preference: Explanation ?Highest Education Level: High School ?Preferred Language: English ?Cognitive Barrier ?Language Barrier: No ?Translator Needed: No ?Memory Deficit: No ?Emotional Barrier: No ?Cultural/Religious Beliefs Affecting Medical Care: No ?Physical Barrier ?Impaired Vision: No ?Impaired Hearing: No ?Decreased Hand dexterity: No ?Knowledge/Comprehension ?Knowledge Level: Medium ?Comprehension Level: Medium ?Ability to understand written instructions: Medium ?Ability to understand verbal instructions: Medium ?Motivation ?Anxiety Level: Calm ?Cooperation: Cooperative ?Education Importance: Acknowledges Need ?Interest in Health Problems: Asks Questions ?Perception: Coherent ?Willingness to Engage in Self-Management ?Medium ?Activities: ?Readiness to Engage in Self-Management ?Medium ?Activities: ?Electronic Signature(s) ?Signed: 03/04/2022 11:51:43 AM By: Hansel Feinstein ?Entered ByHansel Feinstein on 03/04/2022 10:22:58 ?[name], [name] ([id number]) ?-------------------------------------------------------------------------------- ?Fall Risk Assessment Details ?Patient Name: [name], [name]. ?Date of Service: 03/04/2022 10:00 AM ?Medical Record Number: [id number] ?Patient Account Number: [id number] ?Date of Birth/Sex: [birth year] (88 y.o. F) ?Treating RN: Hansel Feinstein ?Primary Care Chanique Duca: Elizabeth Sauer Other  Clinician: ?Referring Kamarii Buren: Elizabeth Sauer ?Treating Marianna Cid/Extender: Allen Derry ?Weeks in Treatment: 0 ?Fall Risk Assessment Items ?Have you had 2 or more falls in the last 12 monthso 0 No ?Have you had any fall that resulted in injury in the last 12 monthso 0 No ?FALLS RISK SCREEN ?History of falling - immediate or within 3 months 0 No ?Secondary diagnosis (Do you have 2 or more medical diagnoseso) 15 Yes ?Ambulatory aid ?None/bed rest/wheelchair/nurse 0 No ?Crutches/cane/walker 15 Yes ?Furniture 0 No ?Intravenous therapy Access/Saline/Heparin Lock 0 No ?Gait/Transferring ?Normal/ bed rest/ wheelchair 0 No ?Weak (short steps with or without shuffle, stooped  but able to lift head while walking, may ?10 Yes ?seek support from furniture) ?Impaired (short steps with shuffle, may have difficulty arising from chair, head down, impaired ?0 No ?balance) ?Mental Status ?Oriented to own ability 0 No ?Electronic Signature(s) ?Signed: 03/04/2022 11:51:43 AM By: Hansel Feinstein ?Entered ByHansel Feinstein on 03/04/2022 10:23:18 ?[name], [name] ([id number]) ?-------------------------------------------------------------------------------- ?Foot Assessment Details ?Patient Name: [name], [name]. ?Date of Service: 03/04/2022 10:00 AM ?Medical Record Number: [id number] ?Patient Account Number: [id number] ?Date of Birth/Sex: [birth year] (88 y.o. F) ?Treating RN: Hansel Feinstein ?Primary Care Ariel Dimitri: Elizabeth Sauer Other Clinician: ?Referring Deshannon Hinchliffe: Elizabeth Sauer ?Treating Zoi Devine/Extender: Allen Derry ?Weeks in Treatment: 0 ?Foot Assessment Items ?Site Locations ?+ = Sensation present, - = Sensation absent, C = Callus, U = Ulcer ?R = Redness, W = Warmth, M = Maceration, PU = Pre-ulcerative lesion ?F = Fissure, S = Swelling, D = Dryness ?Assessment ?Right: Left: ?Other Deformity: No No ?Prior Foot Ulcer: No No ?Prior Amputation: No No ?Charcot Joint: No No ?Ambulatory Status: Ambulatory With Help ?Assistance Device:  Wheelchair ?Gait: ?Electronic Signature(s) ?Signed: 03/04/2022 11:51:43 AM By: Hansel Feinstein ?Entered ByHansel Feinstein on 03/04/2022 10:24:04 ?[name], [name] ([id number]) ?-------------------------------------------------------------------------------- ?Nutrition Risk Screening Details ?Patient Name: [name], [name]. ?Date of Service: 03/04/2022 10:00 AM ?Medical Record Number: [id number] ?Patient Account Number: [id number] ?Date of Birth/Sex: 10/15/1932 (88 y.o. F) ?Treating RN: Hansel Feinstein ?Primary Care Maxamillian Tienda: Elizabeth Sauer Other Clinician: ?Referring Meyer Arora: Elizabeth Sauer ?Treating Destine Zirkle/Extender: Allen Derry ?Weeks in Treatment: 0 ?Height (in): 63 ?Weight (lbs): 174 ?Body Mass Index (BMI): 30.8 ?Nutrition Risk Screening Items ?Score Screening ?NUTRITION RISK SCREEN: ?I have an illness or condition that made me change the kind and/or amount of food I eat 0 No ?I eat fewer than two meals per day 0 No ?I eat few fruits and vegetables, or milk products 0 No ?I have three or more drinks of beer, liquor or wine almost every day 0 No ?I have tooth or mouth problems that make it hard for me to eat 0 No ?I don't always have enough money to buy the food I need 0 No ?I eat alone most of the time 0 No ?I take three or more different prescribed or over-the-counter drugs a day 1 Yes ?Without wanting to, I have lost or gained 10 pounds in the last six months 0 No ?I am not always physically able to shop, cook and/or feed myself 0 No ?Nutrition Protocols ?Good Risk Protocol 0 No interventions needed ?Moderate Risk Protocol ?High Risk Proctocol ?Risk Level: Good Risk ?Score: 1 ?Electronic Signature(s) ?Signed: 03/04/2022 11:51:43 AM By: Hansel Feinstein ?Entered ByHansel Feinstein on 03/04/2022 10:23:31 ?

## 2022-03-07 DIAGNOSIS — S31829D Unspecified open wound of left buttock, subsequent encounter: Secondary | ICD-10-CM | POA: Diagnosis not present

## 2022-03-14 ENCOUNTER — Ambulatory Visit: Payer: Medicare Other | Admitting: Physician Assistant

## 2022-03-21 ENCOUNTER — Encounter: Payer: Medicare Other | Attending: Physician Assistant | Admitting: Physician Assistant

## 2022-03-21 DIAGNOSIS — E11622 Type 2 diabetes mellitus with other skin ulcer: Secondary | ICD-10-CM | POA: Insufficient documentation

## 2022-03-21 DIAGNOSIS — L89323 Pressure ulcer of left buttock, stage 3: Secondary | ICD-10-CM | POA: Insufficient documentation

## 2022-03-21 DIAGNOSIS — E118 Type 2 diabetes mellitus with unspecified complications: Secondary | ICD-10-CM | POA: Diagnosis not present

## 2022-03-21 DIAGNOSIS — Z6830 Body mass index (BMI) 30.0-30.9, adult: Secondary | ICD-10-CM | POA: Diagnosis not present

## 2022-03-21 DIAGNOSIS — E669 Obesity, unspecified: Secondary | ICD-10-CM | POA: Insufficient documentation

## 2022-03-21 DIAGNOSIS — E1151 Type 2 diabetes mellitus with diabetic peripheral angiopathy without gangrene: Secondary | ICD-10-CM | POA: Insufficient documentation

## 2022-03-21 DIAGNOSIS — I10 Essential (primary) hypertension: Secondary | ICD-10-CM | POA: Diagnosis not present

## 2022-03-21 DIAGNOSIS — L89313 Pressure ulcer of right buttock, stage 3: Secondary | ICD-10-CM | POA: Insufficient documentation

## 2022-03-21 DIAGNOSIS — I2510 Atherosclerotic heart disease of native coronary artery without angina pectoris: Secondary | ICD-10-CM | POA: Insufficient documentation

## 2022-03-21 DIAGNOSIS — M179 Osteoarthritis of knee, unspecified: Secondary | ICD-10-CM | POA: Diagnosis not present

## 2022-03-21 NOTE — Progress Notes (Addendum)
Madison, Reynolds ([id number]) ?Visit Report for 03/21/2022 ?Arrival Information Details ?Patient Name: [name], [name]. ?Date of Service: 03/21/2022 11:30 AM ?Medical Record Number: [id number] ?Patient Account Number: [id number] ?Date of Birth/Sex: 09-02-1932 (88 y.o. F) ?Treating RN: Donnamarie Poag ?Primary Care Jaretzi Droz: Otilio Miu Other Clinician: ?Referring Rodell Marrs: Otilio Miu ?Treating Aunica Dauphinee/Extender: Jeri Cos ?Weeks in Treatment: 2 ?Visit Information History Since Last Visit ?Added or deleted any medications: No ?Patient Arrived: Wheel Chair ?Had a fall or experienced change in No ?Arrival Time: 11:22 ?activities of daily living that may affect ?Accompanied By: daughter ?risk of falls: ?Transfer Assistance: Manual ?Hospitalized since last visit: No ?Patient Identification Verified: Yes ?Has Dressing in Place as Prescribed: Yes ?Secondary Verification Process Completed: Yes ?Pain Present Now: No ?Patient Requires Transmission-Based Precautions: No ?Patient Has Alerts: Yes ?Patient Alerts: DIABETIC ?Electronic Signature(s) ?Signed: 03/21/2022 4:43:11 PM By: Donnamarie Poag ?Entered ByDonnamarie Poag on 03/21/2022 11:29:34 ?LAIKIN, LONGTIN ([id number]) ?-------------------------------------------------------------------------------- ?Clinic Level of Care Assessment Details ?Patient Name: Madison, Reynolds. ?Date of Service: 03/21/2022 11:30 AM ?Medical Record Number: [id number] ?Patient Account Number: [id number] ?Date of Birth/Sex: Dec 05, 1931 (88 y.o. F) ?Treating RN: Donnamarie Poag ?Primary Care Lashica Hannay: Otilio Miu Other Clinician: ?Referring Ernesta Trabert: Otilio Miu ?Treating Jearld Hemp/Extender: Jeri Cos ?Weeks in Treatment: 2 ?Clinic Level of Care Assessment Items ?TOOL 4 Quantity Score ?[]  - Use when only an EandM is performed on FOLLOW-UP visit 0 ?ASSESSMENTS - Nursing Assessment / Reassessment ?[]  - Reassessment of Co-morbidities (includes updates in patient status) 0 ?[]  - 0 ?Reassessment of Adherence to  Treatment Plan ?ASSESSMENTS - Wound and Skin Assessment / Reassessment ?X - Simple Wound Assessment / Reassessment - one wound 1 5 ?[]  - 0 ?Complex Wound Assessment / Reassessment - multiple wounds ?[]  - 0 ?Dermatologic / Skin Assessment (not related to wound area) ?ASSESSMENTS - Focused Assessment ?[]  - Circumferential Edema Measurements - multi extremities 0 ?[]  - 0 ?Nutritional Assessment / Counseling / Intervention ?[]  - 0 ?Lower Extremity Assessment (monofilament, tuning fork, pulses) ?[]  - 0 ?Peripheral Arterial Disease Assessment (using hand held doppler) ?ASSESSMENTS - Ostomy and/or Continence Assessment and Care ?[]  - Incontinence Assessment and Management 0 ?[]  - 0 ?Ostomy Care Assessment and Management (repouching, etc.) ?PROCESS - Coordination of Care ?X - Simple Patient / Family Education for ongoing care 1 15 ?[]  - 0 ?Complex (extensive) Patient / Family Education for ongoing care ?[]  - 0 ?Staff obtains Consents, Records, Test Results / Process Orders ?[]  - 0 ?Staff telephones HHA, Nursing Homes / Clarify orders / etc ?[]  - 0 ?Routine Transfer to another Facility (non-emergent condition) ?[]  - 0 ?Routine Hospital Admission (non-emergent condition) ?[]  - 0 ?New Admissions / Biomedical engineer / Ordering NPWT, Apligraf, etc. ?[]  - 0 ?Emergency Hospital Admission (emergent condition) ?X- 1 10 ?Simple Discharge Coordination ?[]  - 0 ?Complex (extensive) Discharge Coordination ?PROCESS - Special Needs ?[]  - Pediatric / Minor Patient Management 0 ?[]  - 0 ?Isolation Patient Management ?[]  - 0 ?Hearing / Language / Visual special needs ?[]  - 0 ?Assessment of Community assistance (transportation, D/C planning, etc.) ?[]  - 0 ?Additional assistance / Altered mentation ?[]  - 0 ?Support Surface(s) Assessment (bed, cushion, seat, etc.) ?INTERVENTIONS - Wound Cleansing / Measurement ?LILIANNAH, LANGEVIN ([id number]) ?X- 1 5 ?Simple Wound Cleansing - one wound ?[]  - 0 ?Complex Wound Cleansing - multiple wounds ?X- 1  5 ?Wound Imaging (photographs - any number of wounds) ?[]  - 0 ?Wound Tracing (instead of photographs) ?X- 1 5 ?Simple Wound Measurement - one wound ?[]  - 0 ?  Complex Wound Measurement - multiple wounds ?INTERVENTIONS - Wound Dressings ?X - Small Wound Dressing one or multiple wounds 1 10 ?[]  - 0 ?Medium Wound Dressing one or multiple wounds ?[]  - 0 ?Large Wound Dressing one or multiple wounds ?X- 1 5 ?Application of Medications - topical ?[]  - 0 ?Application of Medications - injection ?INTERVENTIONS - Miscellaneous ?[]  - External ear exam 0 ?[]  - 0 ?Specimen Collection (cultures, biopsies, blood, body fluids, etc.) ?[]  - 0 ?Specimen(s) / Culture(s) sent or taken to Lab for analysis ?[]  - 0 ?Patient Transfer (multiple staff / Civil Service fast streamer / Similar devices) ?[]  - 0 ?Simple Staple / Suture removal (25 or less) ?[]  - 0 ?Complex Staple / Suture removal (26 or more) ?[]  - 0 ?Hypo / Hyperglycemic Management (close monitor of Blood Glucose) ?[]  - 0 ?Ankle / Brachial Index (ABI) - do not check if billed separately ?X- 1 5 ?Vital Signs ?Has the patient been seen at the hospital within the last three years: Yes ?Total Score: 65 ?Level Of Care: New/Established - Level ?2 ?Electronic Signature(s) ?Signed: 03/21/2022 4:43:11 PM By: Donnamarie Poag ?Entered ByDonnamarie Poag on 03/21/2022 11:45:42 ?ADDLYN, EID ([id number]) ?-------------------------------------------------------------------------------- ?Encounter Discharge Information Details ?Patient Name: Madison, Reynolds. ?Date of Service: 03/21/2022 11:30 AM ?Medical Record Number: [id number] ?Patient Account Number: [id number] ?Date of Birth/Sex: 28-Nov-1931 (88 y.o. F) ?Treating RN: Donnamarie Poag ?Primary Care Alexxis Mackert: Otilio Miu Other Clinician: ?Referring Najah Liverman: Otilio Miu ?Treating Maevis Mumby/Extender: Jeri Cos ?Weeks in Treatment: 2 ?Encounter Discharge Information Items ?Discharge Condition: Stable ?Ambulatory Status: Wheelchair ?Discharge Destination:  Home ?Transportation: Private Auto ?Accompanied By: daughter ?Schedule Follow-up Appointment: Yes ?Clinical Summary of Care: ?Electronic Signature(s) ?Signed: 03/21/2022 4:43:11 PM By: Donnamarie Poag ?Entered ByDonnamarie Poag on 03/21/2022 11:49:11 ?ITALI, RASCHE ([id number]) ?-------------------------------------------------------------------------------- ?Lower Extremity Assessment Details ?Patient Name: ADRIENNE, GISI. ?Date of Service: 03/21/2022 11:30 AM ?Medical Record Number: [id number] ?Patient Account Number: [id number] ?Date of Birth/Sex: 21-Jul-1932 (88 y.o. F) ?Treating RN: Donnamarie Poag ?Primary Care Charlyne Robertshaw: Otilio Miu Other Clinician: ?Referring Tanja Gift: Otilio Miu ?Treating Cheryl Stabenow/Extender: Jeri Cos ?Weeks in Treatment: 2 ?Electronic Signature(s) ?Signed: 03/21/2022 4:43:11 PM By: Donnamarie Poag ?Entered ByDonnamarie Poag on 03/21/2022 11:33:32 ?YULA, ROSTRON ([id number]) ?-------------------------------------------------------------------------------- ?Multi Wound Chart Details ?Patient Name: GLENDOLA, RENDEROS. ?Date of Service: 03/21/2022 11:30 AM ?Medical Record Number: [id number] ?Patient Account Number: [id number] ?Date of Birth/Sex: 07-20-1932 (88 y.o. F) ?Treating RN: Donnamarie Poag ?Primary Care Chlora Mcbain: Otilio Miu Other Clinician: ?Referring Viney Acocella: Otilio Miu ?Treating Jesse Nosbisch/Extender: Jeri Cos ?Weeks in Treatment: 2 ?Vital Signs ?Height(in): 63 ?Pulse(bpm): 109 ?Weight(lbs): 174 ?Blood Pressure(mmHg): 156/74 ?Body Mass Index(BMI): 30.8 ?Temperature(??F): 98.2 ?Respiratory Rate(breaths/min): 16 ?Photos: [N/A:N/A] ?Wound Location: Left Gluteus N/A N/A ?Wounding Event: Gradually Appeared N/A N/A ?Primary Etiology: Pressure Ulcer N/A N/A ?Comorbid History: Arrhythmia, Coronary Artery N/A N/A ?Disease, Hypertension, Type II ?Diabetes, Osteoarthritis ?Date Acquired: 02/04/2022 N/A N/A ?Weeks of Treatment: 2 N/A N/A ?Wound Status: Open N/A N/A ?Wound Recurrence: No N/A N/A ?Measurements L x W  x D (cm) 0.5x0.4x0.1 N/A N/A ?Area (cm?) : 0.157 N/A N/A ?Volume (cm?) : 0.016 N/A N/A ?% Reduction in Area: 0.00% N/A N/A ?% Reduction in Volume: 0.00% N/A N/A ?Classification: Category/Stage III N/A N/A ?Exudate Amount: M

## 2022-03-21 NOTE — Progress Notes (Addendum)
LARKYN, GREENBERGER ([id number]) ?Visit Report for 03/21/2022 ?Chief Complaint Document Details ?Patient Name: JULIEANA, ESHLEMAN. ?Date of Service: 03/21/2022 11:30 AM ?Medical Record Number: [id number] ?Patient Account Number: [id number] ?Date of Birth/Sex: 15-Jul-1932 (88 y.o. F) ?Treating RN: Donnamarie Poag ?Primary Care Provider: Otilio Miu Other Clinician: ?Referring Provider: Otilio Miu ?Treating Provider/Extender: Jeri Cos ?Weeks in Treatment: 2 ?Information Obtained from: Patient ?Chief Complaint ?Left buttock ulcer ?Electronic Signature(s) ?Signed: 03/21/2022 11:20:34 AM By: Worthy Keeler PA-C ?Entered By: Worthy Keeler on 03/21/2022 11:20:34 ?JAYLAA, GALLION ([id number]) ?-------------------------------------------------------------------------------- ?HPI Details ?Patient Name: NAIROBI, GUSTAFSON. ?Date of Service: 03/21/2022 11:30 AM ?Medical Record Number: [id number] ?Patient Account Number: [id number] ?Date of Birth/Sex: [birth year] (88 y.o. F) ?Treating RN: Donnamarie Poag ?Primary Care Provider: Otilio Miu Other Clinician: ?Referring Provider: Otilio Miu ?Treating Provider/Extender: Jeri Cos ?Weeks in Treatment: 2 ?History of Present Illness ?HPI Description: 03-04-2022 upon evaluation today patient appears to be doing poorly in regard to her gluteal region. Its mainly in the left ?gluteal region where there is an open ulceration at this point. This does appear to be a stage III pressure ulcer. Fortunately there does not appear ?to be any evidence of active infection at this site locally nor systemically which is also news. The patient has been counseled by her daughter who ?is present with her today her son was also on the phone we had a good discussion at this point. Nonetheless they have been trying to get her to ?not sit as long as they knew that was the major issue the patient tells me however she has a hard time getting up because of her knee. She has ?had of extensive work-up and treatment for the  knee and fortunately has been determined this is just arthritis and there is really not much more ?that they can do. Nonetheless I think she is going to have to get up and move around or at least get up and stand and allow for blood flow into ?the gluteal area or out she is can end up with more significant wounds than what we have and see right now. ?Patient does have a history of diabetes mellitus type 2, hypertension, and coronary artery disease. This wound has been present for at this point ?roughly 1 month beginning on or around February 04, 2022. ?03-21-2022 upon evaluation today patient's wound actually showing signs of excellent improvement I am very pleased with where we stand today ?there does not appear to be any signs of infection which is great news and overall I think we are headed in the right direction. This is not terribly ?smaller but it is also looking better than what it was as far as the health of the surface of the wound is concerned. ?Electronic Signature(s) ?Signed: 03/21/2022 11:43:07 AM By: Worthy Keeler PA-C ?Entered By: Worthy Keeler on 03/21/2022 11:43:07 ?HERTHA, GERGEN ([id number]) ?-------------------------------------------------------------------------------- ?Physical Exam Details ?Patient Name: JENNINGS, STIRLING. ?Date of Service: 03/21/2022 11:30 AM ?Medical Record Number: [id number] ?Patient Account Number: [id number] ?Date of Birth/Sex: 1932-10-07 (88 y.o. F) ?Treating RN: Donnamarie Poag ?Primary Care Provider: Otilio Miu Other Clinician: ?Referring Provider: Otilio Miu ?Treating Provider/Extender: Jeri Cos ?Weeks in Treatment: 2 ?Constitutional ?Well-nourished and well-hydrated in no acute distress. ?Respiratory ?normal breathing without difficulty. ?Psychiatric ?this patient is able to make decisions and demonstrates good insight into disease process. Alert and Oriented x 3. pleasant and cooperative. ?Notes ?Upon inspection patient's wound bed showed signs of good granulation  and epithelization at this point we  are using Xeroform which I think is ?doing a great job he does not appear to be too moist which is perfect. They are changing this once a day. ?Electronic Signature(s) ?Signed: 03/21/2022 11:43:21 AM By: Worthy Keeler PA-C ?Entered By: Worthy Keeler on 03/21/2022 11:43:20 ?QUATISHA, ZYLKA ([id number]) ?-------------------------------------------------------------------------------- ?Physician Orders Details ?Patient Name: GABRYELLA, MURFIN. ?Date of Service: 03/21/2022 11:30 AM ?Medical Record Number: [id number] ?Patient Account Number: [id number] ?Date of Birth/Sex: 06/17/1932 (88 y.o. F) ?Treating RN: Donnamarie Poag ?Primary Care Provider: Otilio Miu Other Clinician: ?Referring Provider: Otilio Miu ?Treating Provider/Extender: Jeri Cos ?Weeks in Treatment: 2 ?Verbal / Phone Orders: No ?Diagnosis Coding ?ICD-10 Coding ?Code Description ?F81.829 Pressure ulcer of left buttock, stage 3 ?E11.622 Type 2 diabetes mellitus with other skin ulcer ?I10 Essential (primary) hypertension ?I25.10 Atherosclerotic heart disease of native coronary artery without angina pectoris ?Follow-up Appointments ?o Return Appointment in 1 week. ?Bathing/ Shower/ Hygiene ?o Wash wounds with antibacterial soap and water. - change dressing after shower ?o No tub bath. ?Anesthetic (Use 'Patient Medications' Section for Anesthetic Order Entry) ?o Lidocaine applied to wound bed ?Edema Control - Lymphedema / Segmental Compressive Device / Other ?o DO YOUR BEST to sleep in the bed at night. DO NOT sleep in your recliner. Long hours of sitting in a recliner leads to ?swelling of the legs and/or potential wounds on your backside. - Try to sleep in the bed ?Off-Loading ?o Gel wheelchair cushion ?o Turn and reposition every 2 hours ?Additional Orders / Instructions ?o Follow Nutritious Diet and Increase Protein Intake - monitor blood sugar also ?Wound Treatment ?Wound #1 - Gluteus Wound Laterality:  Left ?Cleanser: [employer] Ancillary Kit - 15 Day Supply (Generic) 1 x Per Day/15 Days ?Discharge Instructions: Use supplies as instructed; Kit contains: (15) Saline Bullets; (15) 3x3 Gauze; 15 pr Gloves ?Cleanser: Soap and Water 1 x Per Day/15 Days ?Discharge Instructions: Gently cleanse wound with antibacterial soap, rinse and pat dry prior to dressing wounds ?Cleanser: Wound Cleanser 1 x Per Day/15 Days ?Discharge Instructions: Wash your hands with soap and water. Remove old dressing, discard into plastic bag and place into trash. ?Cleanse the wound with Wound Cleanser prior to applying a clean dressing using gauze sponges, not tissues or cotton balls. Do not ?scrub or use excessive force. Pat dry using gauze sponges, not tissue or cotton balls. ?Primary Dressing: Xeroform 4x4-HBD (in/in) 1 x Per Day/15 Days ?Discharge Instructions: Apply Xeroform 4x4-HBD (in/in) as directed ?Secondary Dressing: (SILICONE BORDER) Zetuvit Plus SILICONE BORDER Dressing 4x4 (in/in) 1 x Per Day/15 Days ?Discharge Instructions: Please do not put silicone bordered dressings under wraps. Use non-bordered dressing only. ?Electronic Signature(s) ?Signed: 03/21/2022 11:45:50 AM By: Worthy Keeler PA-C ?Signed: 03/21/2022 4:43:11 PM By: Donnamarie Poag ?Entered ByDonnamarie Poag on 03/21/2022 11:36:55 ?OCIE, STANZIONE ([id number]) ?NAJEE, COWENS ([id number]) ?-------------------------------------------------------------------------------- ?Problem List Details ?Patient Name: JOYELL, EMAMI. ?Date of Service: 03/21/2022 11:30 AM ?Medical Record Number: [id number] ?Patient Account Number: [id number] ?Date of Birth/Sex: 1932-04-30 (88 y.o. F) ?Treating RN: Donnamarie Poag ?Primary Care Provider: Otilio Miu Other Clinician: ?Referring Provider: Otilio Miu ?Treating Provider/Extender: Jeri Cos ?Weeks in Treatment: 2 ?Active Problems ?ICD-10 ?Encounter ?Code Description Active Date MDM ?Diagnosis ?E42.353 Pressure ulcer of left buttock, stage 3 03/04/2022  No Yes ?E11.622 Type 2 diabetes mellitus with other skin ulcer 03/04/2022 No Yes ?I10 Essential (primary) hypertension 03/04/2022 No Yes ?I25.10 Atherosclerotic heart disease of native coronary artery wit

## 2022-03-24 ENCOUNTER — Ambulatory Visit
Payer: Medicare Other | Attending: Student in an Organized Health Care Education/Training Program | Admitting: Student in an Organized Health Care Education/Training Program

## 2022-03-24 ENCOUNTER — Encounter: Payer: Self-pay | Admitting: Student in an Organized Health Care Education/Training Program

## 2022-03-24 DIAGNOSIS — T8484XS Pain due to internal orthopedic prosthetic devices, implants and grafts, sequela: Secondary | ICD-10-CM

## 2022-03-24 DIAGNOSIS — G8929 Other chronic pain: Secondary | ICD-10-CM | POA: Diagnosis not present

## 2022-03-24 DIAGNOSIS — Z96651 Presence of right artificial knee joint: Secondary | ICD-10-CM | POA: Diagnosis not present

## 2022-03-24 DIAGNOSIS — G894 Chronic pain syndrome: Secondary | ICD-10-CM | POA: Diagnosis not present

## 2022-03-24 DIAGNOSIS — M25561 Pain in right knee: Secondary | ICD-10-CM

## 2022-03-24 DIAGNOSIS — Z96652 Presence of left artificial knee joint: Secondary | ICD-10-CM

## 2022-03-24 DIAGNOSIS — Z96653 Presence of artificial knee joint, bilateral: Secondary | ICD-10-CM | POA: Diagnosis not present

## 2022-03-24 DIAGNOSIS — M25562 Pain in left knee: Secondary | ICD-10-CM | POA: Diagnosis not present

## 2022-03-24 NOTE — Progress Notes (Signed)
Patient: [name]  Service Category: E/M  Provider: Gillis Santa, MD  ?DOB: 1932/09/21  DOS: 03/24/2022  Location: Office  ?MRN: [id number]  Setting: Ambulatory outpatient  Referring Provider: Juline Patch, MD  ?Type: Established Patient  Specialty: Interventional Pain Management  PCP: Juline Patch, MD  ?Location: Remote location  Delivery: TeleHealth    ? ?Virtual Encounter - Pain Management ?PROVIDER NOTE: Information contained herein reflects review and annotations entered in association with encounter. Interpretation of such information and data should be left to medically-trained personnel. Information provided to patient can be located elsewhere in the medical record under "Patient Instructions". Document created using STT-dictation technology, any transcriptional errors that may result from process are unintentional.  ?  ?Contact & Pharmacy ?Preferred: [phone] ?Home: [phone] (home) ?Mobile: [phone] (mobile) ?E-mail: No e-mail address on record  ?[place], [place] ?[address] ?[place] [address] ?Phone: [phone] Fax: [phone] ? ?[id number]?Phone: [phone]: [phone]?  ?Pre-screening  ?[name] offered "in-person" vs "virtual" encounter. She indicated preferring virtual for this encounter.  ? ?Reason ?COVID-19*  Social distancing based on CDC and AMA recommendations.  ? ?I contacted DKerri Percheson 03/24/2022 via telephone.      I clearly identified myself as BGillis Santa MD. I verified that I was speaking with the correct person using two identifiers (Name: DCATHERYN SLIFER and date of birth: 224-Apr-1933. ? ?Consent ?I sought verbal advanced consent from [name] virtual visit interactions. I informed Ms. CSticeof possible security and privacy concerns, risks, and limitations associated with providing  "not-in-person" medical evaluation and management services. I also informed Ms. CPetrasof the availability of "in-person" appointments. Finally, I informed her that there would be a charge for the virtual visit and that she could be  personally, fully or partially, financially responsible for it. Ms. CMohammadexpressed understanding and agreed to proceed.  ? ?Historic Elements   ?Ms. DEMMAMAE MCNAMARAis a 88y.o. year old, female patient evaluated today after our last contact on 02/08/2022. [name] has a past medical history of GERD (gastroesophageal reflux disease), Hypertension, Right knee DJD, and Type II diabetes mellitus ([place]. She also  has a past surgical history that includes Total knee arthroplasty (Left, 03/28/2011); Cervical disc surgery (12/28/2010); Total knee arthroplasty (Right, 08/19/2013); Joint replacement; Back surgery; Total knee arthroplasty (Right, 08/19/2013); and Cataract extraction (Bilateral, 03/2016). Ms. CNewlonhas a current medication list which includes the following prescription(s): cyanocobalamin, diclofenac sodium, docusate sodium, glipizide, glucose blood, hydrochlorothiazide, hydrocodone-acetaminophen, loratadine, metformin, mometasone, montelukast, olmesartan, and omeprazole. She  reports that she has never smoked. She has never used smokeless tobacco. She reports that she does not currently use alcohol. She reports that she does not use drugs. Ms. CStokesis allergic to nsaids and lisinopril.  ? ?HPI  ?Today, she is being contacted for worsening of previously known (established) problem ? ?Increased bilateral knee pain in the context of having bilateral knee replacements.  She is status post left knee genicular RFA on 10/06/2021 that provided her with approximately 75% pain relief for 6 months.  She recently went to the emergency department for bilateral knee pain.  She states that her right knee is also giving her problems now.  We discussed bilateral genicular nerve block.  Risk  and benefits reviewed and patient like to proceed. ? ?Laboratory Chemistry  Profile  ? ?Renal ?Lab Results  ?Component Value Date  ? BUN 17 01/17/2022  ? CREATININE 0.62 01/17/2022  ? BCR 27 01/17/2022  ? GFRAA 91 03/18/2020  ? GFRNONAA >60 04/13/2021  ?  Hepatic ?Lab Results  ?Component Value Date  ? AST 11 01/17/2022  ? ALT 5 01/17/2022  ? ALBUMIN 3.5 (L) 01/17/2022  ? ALKPHOS 85 01/17/2022  ?  ?Electrolytes ?Lab Results  ?Component Value Date  ? NA 137 01/17/2022  ? K 4.4 01/17/2022  ? CL 102 01/17/2022  ? CALCIUM 9.1 01/17/2022  ? MG 1.7 05/10/2021  ? PHOS 3.5 02/04/2021  ?  Bone ?No results found for: [place], [id number], [id number], [id number], 25OHVITD1, 25OHVITD2, 25OHVITD3, TESTOFREE, TESTOSTERONE  ?Inflammation (CRP: Acute Phase) (ESR: Chronic Phase) ?No results found for: CRP, ESRSEDRATE, LATICACIDVEN    ?  ? ?Note: Above Lab results reviewed. ? ?Imaging  ?ECHOCARDIOGRAM COMPLETE ?   ECHOCARDIOGRAM REPORT   ? ?  ? ?Patient Name:   [name] Date of Exam: 10/19/2021 ?Medical Rec #:  [id number]       Height:       63.0 in ?Accession #:    [id number]      Weight:       173.0 lb ?Date of Birth:  04/20/1932       BSA:          1.818 m? ?Patient Age:    88 years        BP:           108/58 mmHg ?Patient Gender: F               HR:           76 bpm. ?Exam Location:   ? ?Procedure: 2D Echo, Cardiac Doppler and Color Doppler ? ?Indications:    I35.1 Nonrheumatic aortic (valve) insufficiency ?  ?History:        Patient has prior history of Echocardiogram examinations, most ?                recent 04/13/2021. Aortic Valve Disease, Signs/Symptoms:Edema; ?                Risk Factors:Hypertension, Diabetes, Dyslipidemia and ?                Non-Smoker. ?  ?Sonographer:    [employer], RVT ?Sonographer#2:  Wilkie Aye RVT ?Referring Phys: [id number] ? ?  ?Sonographer Comments: Suboptimal parasternal window and Technically challenging study due to limited acoustic windows. ?IMPRESSIONS ? ? 1.  Left ventricular ejection fraction, by estimation, is 60 to 65%. The left ventricle has normal function. The left ventricle has no regional wall motion abnormalities. Left ventricular diastolic parameters are consistent with Grade I diastolic  ?dysfunction (impaired relaxation). ? 2. Right ventricular systolic function is normal. The right ventricular size is normal. There is mildly elevated pulmonary artery systolic pressure. The estimated right ventricular systolic pressure is 58.8 mmHg. ? 3. The mitral valve is normal in structure. Moderate mitral valve regurgitation. No evidence of mitral stenosis. ? 4. Tricuspid valve regurgitation is moderate. ? 5. The aortic valve is calcified. Aortic valve regurgitation is mild. Borderline aortic valve stenosis. ? 6. The inferior vena cava is normal in size with greater than 50% respiratory variability, suggesting right atrial pressure of 3 mmHg. ? ?Comparison(s): LVEF 60-65%. ? ?FINDINGS ? Left Ventricle: Left ventricular ejection fraction, by estimation, is 60 to 65%. The left ventricle has normal function. The left  ventricle has no regional wall motion abnormalities. The left ventricular internal cavity size was normal in size. There is ? no left ventricular hypertrophy. Left ventricular diastolic parameters are consistent with Grade I diastolic dysfunction (impaired relaxation). ? ?Right Ventricle: The right ventricular size is normal. No increase in right ventricular wall thickness. Right ventricular systolic function is normal. There is mildly elevated pulmonary artery systolic pressure. The tricuspid regurgitant velocity is 2.93 ? m/s, and with an assumed right atrial pressure of 5 mmHg, the estimated right ventricular systolic pressure is 30.1 mmHg. ? ?Left Atrium: Left atrial size was normal in size. ? ?Right Atrium: Right atrial size was normal in size. ? ?Pericardium: There is no evidence of pericardial effusion. ? ?Mitral Valve: The mitral valve is normal in  structure. Moderate mitral valve regurgitation. No evidence of mitral valve stenosis. ? ?Tricuspid Valve: The tricuspid valve is normal in structure. Tricuspid valve regurgitation is moderate . No evidence of t

## 2022-03-31 ENCOUNTER — Ambulatory Visit: Payer: Medicare Other | Admitting: Physician Assistant

## 2022-04-01 ENCOUNTER — Ambulatory Visit: Payer: Medicare Other | Admitting: Internal Medicine

## 2022-04-04 ENCOUNTER — Ambulatory Visit (HOSPITAL_BASED_OUTPATIENT_CLINIC_OR_DEPARTMENT_OTHER): Payer: Medicare Other | Admitting: Student in an Organized Health Care Education/Training Program

## 2022-04-04 ENCOUNTER — Encounter: Payer: Self-pay | Admitting: Student in an Organized Health Care Education/Training Program

## 2022-04-04 ENCOUNTER — Ambulatory Visit
Admission: RE | Admit: 2022-04-04 | Discharge: 2022-04-04 | Disposition: A | Discharge status: Home / Self Care | Payer: Medicare Other | Source: Ambulatory Visit | Attending: Student in an Organized Health Care Education/Training Program | Admitting: Student in an Organized Health Care Education/Training Program

## 2022-04-04 VITALS — BP 161/75 | HR 76 | Temp 97.8°F | Resp 16 | Ht 63.0 in | Wt 160.0 lb

## 2022-04-04 DIAGNOSIS — G894 Chronic pain syndrome: Secondary | ICD-10-CM | POA: Insufficient documentation

## 2022-04-04 DIAGNOSIS — Z96652 Presence of left artificial knee joint: Secondary | ICD-10-CM | POA: Diagnosis not present

## 2022-04-04 DIAGNOSIS — Z96651 Presence of right artificial knee joint: Secondary | ICD-10-CM | POA: Diagnosis not present

## 2022-04-04 DIAGNOSIS — G8929 Other chronic pain: Secondary | ICD-10-CM

## 2022-04-04 DIAGNOSIS — M25561 Pain in right knee: Secondary | ICD-10-CM | POA: Diagnosis not present

## 2022-04-04 DIAGNOSIS — M25562 Pain in left knee: Secondary | ICD-10-CM

## 2022-04-04 DIAGNOSIS — Z96653 Presence of artificial knee joint, bilateral: Secondary | ICD-10-CM

## 2022-04-04 DIAGNOSIS — T8484XS Pain due to internal orthopedic prosthetic devices, implants and grafts, sequela: Secondary | ICD-10-CM | POA: Diagnosis not present

## 2022-04-04 MED ORDER — LIDOCAINE HCL 2 % IJ SOLN
20.0000 mL | Freq: Once | INTRAMUSCULAR | Status: AC
  Administered 2022-04-04: 400 mg
  Filled 2022-04-04: qty 20

## 2022-04-04 MED ORDER — DEXAMETHASONE SODIUM PHOSPHATE 10 MG/ML IJ SOLN
20.0000 mg | Freq: Once | INTRAMUSCULAR | Status: AC
  Administered 2022-04-04: 20 mg
  Filled 2022-04-04: qty 2

## 2022-04-04 MED ORDER — ROPIVACAINE HCL 2 MG/ML IJ SOLN
9.0000 mL | Freq: Once | INTRAMUSCULAR | Status: AC
  Administered 2022-04-04: 9 mL via PERINEURAL
  Filled 2022-04-04: qty 20

## 2022-04-04 NOTE — Patient Instructions (Signed)

## 2022-04-04 NOTE — Progress Notes (Signed)
PROVIDER NOTE: Interpretation of information contained herein should be left to medically-trained personnel. Specific patient instructions are provided elsewhere under "Patient Instructions" section of medical record. This document was created in part using STT-dictation technology, any transcriptional errors that may result from this process are unintentional.  ?Patient: [name] ?Type: Established ?DOB: 06-Aug-1932 ?MRN: [id number] ?PCP: Madison Patch, MD  Service: Procedure ?DOS: 04/04/2022 ?Setting: Ambulatory ?Location: Ambulatory outpatient facility ?Delivery: Face-to-face Provider: Gillis Santa, MD ?Specialty: Interventional Pain Management ?Specialty designation: 09 ?Location: Outpatient facility ?Ref. Prov.: Madison Patch, MD   ? ?Primary Reason for Visit: Interventional Pain Management Treatment. ?CC: Knee Pain (bilateral) ?  ?Procedure:              ? Type: Genicular Nerves Block (Superolateral, Superomedial, and Inferomedial Genicular Nerves)  ?Laterality: Bilateral (-50)  ?Level: Superior and inferior to the knee joint.  ?Imaging: Fluoroscopic guidance ?Anesthesia: Local anesthesia (1-2% Lidocaine) ?Anxiolysis: None                 ?Sedation: None. ?DOS: 04/04/2022  ?Performed by: Madison Santa, MD ? ?Purpose: Diagnostic/Therapeutic ?Indications: Chronic knee pain severe enough to impact quality of life or function. ?Rationale (medical necessity): procedure needed and proper for the diagnosis and/or treatment of [name]'s medical symptoms and needs. ?1. History of bilateral knee replacement   ?2. Pain due to total left knee replacement, sequela   ?3. Pain due to total right knee replacement, sequela   ?4. Chronic pain of left knee   ?5. Chronic pain of right knee   ?6. Chronic pain syndrome   ? ?NAS-11 Pain score:  ? Pre-procedure: 8 /10  ? Post-procedure: 0-No pain/10  ? ?  ?Target: For Genicular Nerve block(s), the targets are: the superolateral genicular nerve, located in the lateral distal  portion of the femoral shaft as it curves to form the lateral epicondyle, in the region of the distal femoral metaphysis; the superomedial genicular nerve, located in the medial distal portion of the femoral shaft as it curves to form the medial epicondyle; and the inferomedial genicular nerve, located in the medial, proximal portion of the tibial shaft, as it curves to form the medial epicondyle, in the region of the proximal tibial metaphysis.  ?Location: Superolateral, Superomedial, and Inferomedial aspects of knee joint.  ?Region: Lateral, Anterior, and Medial aspects of the knee joint, above and below the knee joint proper. ?Approach: Percutaneous  ?Type of procedure: Percutaneous perineural nerve block. The genicular nerve block is a motor-sparing technique that anesthetizes the sensory terminal branches innervating the knee joint, resulting in anesthesia of the anterior compartment of the knee. The distribution of anesthesia of each nerve is mostly in the corresponding quadrant. ? ?Neuroanatomy: The superolateral genicular nerve (SLGN) courses around the femur shaft to pass between the vastus lateralis and the lateral epicondyle. It accompanies the superior lateral genicular artery. The superomedial genicular nerve (SMGN) courses around the femur shaft, following the superior medial genicular artery, to pass between the adductor magnus tendon and the medial epicondyle below the vastus medialis. The inferolateral genicular nerve (ILGN) courses around the tibial lateral epicondyle deep to the lateral collateral ligament, following the inferior lateral genicular artery, superior of the fibula head. The inferomedial genicular nerve (IMGN) courses horizontally below the medial collateral ligament between the tibial medial epicondyle and the insertion of the collateral ligament. It accompanies the inferior medial genicular artery. The recurrent peroneal nerve originates in the inferior popliteal region from the  common peroneal nerve and courses  horizontally around the fibula to pass just inferior of the fibula head and travel superior to the anterolateral tibial epicondyle. It accompanies the recurrent tibial artery. ? ?Position / Prep / Materials:  ?Position: Supine, Modified Fowler's position with pillows under the targeted knee(s). The patient is placed in a supine position with the knee slightly flexed by placing a pillow in the popliteal fossa. ?Prep solution: DuraPrep (Iodine Povacrylex [0.7% available iodine] and Isopropyl Alcohol, 74% w/w) ?Prep Area: Entire knee area, from mid-thigh to mid-shin, lateral, anterior, and medial aspects. ?Materials:  ?Tray: Block ?Needle(s):  ?Type: Spinal  ?Gauge (G): 22  ?Length: 3.5-in  ?Qty: 3 ? ?Pre-op H&P Assessment:  ?[name] is a 88 y.o. (year old), female patient, seen today for interventional treatment. She  has a past surgical history that includes Total knee arthroplasty (Left, 03/28/2011); Cervical disc surgery (12/28/2010); Total knee arthroplasty (Right, 08/19/2013); Joint replacement; Back surgery; Total knee arthroplasty (Right, 08/19/2013); and Cataract extraction (Bilateral, 03/2016). [name] has a current medication list which includes the following prescription(s): cyanocobalamin, diclofenac sodium, docusate sodium, glipizide, glucose blood, hydrochlorothiazide, hydrocodone-acetaminophen, loratadine, metformin, mometasone, montelukast, olmesartan, and omeprazole. Her primarily concern today is the Knee Pain (bilateral) ? ?Initial Vital Signs:  ?Pulse/HCG Rate: 75  ?Temp: 97.8 ?F (36.6 ?C) ?Resp: 18 ?BP: 132/77 ?SpO2: 97 % ? ?BMI: Estimated body mass index is 28.34 kg/m? as calculated from the following: ?  Height as of this encounter: 5\' 3"  (1.6 m). ?  Weight as of this encounter: 160 lb (72.6 kg). ? ?Risk Assessment: ?Allergies: Reviewed. She is allergic to nsaids and lisinopril.  ?Allergy Precautions: None required ?Coagulopathies: Reviewed. None identified.   ?Blood-thinner therapy: None at this time ?Active Infection(s): Reviewed. None identified. [name] is afebrile ? ?Site Confirmation: Ms. Sappenfield was asked to confirm the procedure and laterality before marking the site ?Procedure checklist: Completed ?Consent: Before the procedure and under the influence of no sedative(s), amnesic(s), or anxiolytics, the patient was informed of the treatment options, risks and possible complications. To fulfill our ethical and legal obligations, as recommended by the American Medical Association's Code of Ethics, I have informed the patient of my clinical impression; the nature and purpose of the treatment or procedure; the risks, benefits, and possible complications of the intervention; the alternatives, including doing nothing; the risk(s) and benefit(s) of the alternative treatment(s) or procedure(s); and the risk(s) and benefit(s) of doing nothing. ?The patient was provided information about the general risks and possible complications associated with the procedure. These may include, but are not limited to: failure to achieve desired goals, infection, bleeding, organ or nerve damage, allergic reactions, paralysis, and death. ?In addition, the patient was informed of those risks and complications associated to the procedure, such as failure to decrease pain; infection; bleeding; organ or nerve damage with subsequent damage to sensory, motor, and/or autonomic systems, resulting in permanent pain, numbness, and/or weakness of one or several areas of the body; allergic reactions; (i.e.: anaphylactic reaction); and/or death. ?Furthermore, the patient was informed of those risks and complications associated with the medications. These include, but are not limited to: allergic reactions (i.e.: anaphylactic or anaphylactoid reaction(s)); adrenal axis suppression; blood sugar elevation that in diabetics may result in ketoacidosis or comma; water retention that in patients with  history of congestive heart failure may result in shortness of breath, pulmonary edema, and decompensation with resultant heart failure; weight gain; swelling or edema; medication-induced neural toxicity; pa

## 2022-04-04 NOTE — Progress Notes (Signed)
Safety precautions to be maintained throughout the outpatient stay will include: orient to surroundings, keep bed in low position, maintain call bell within reach at all times, provide assistance with transfer out of bed and ambulation.  

## 2022-04-05 ENCOUNTER — Telehealth: Payer: Self-pay | Admitting: *Deleted

## 2022-04-05 NOTE — Telephone Encounter (Signed)
Called for post procedure check. Denies any problems. 

## 2022-04-15 ENCOUNTER — Encounter: Payer: Medicare Other | Admitting: Physician Assistant

## 2022-04-15 DIAGNOSIS — L89323 Pressure ulcer of left buttock, stage 3: Secondary | ICD-10-CM | POA: Diagnosis not present

## 2022-04-15 DIAGNOSIS — E11622 Type 2 diabetes mellitus with other skin ulcer: Secondary | ICD-10-CM | POA: Diagnosis not present

## 2022-04-15 DIAGNOSIS — I10 Essential (primary) hypertension: Secondary | ICD-10-CM | POA: Diagnosis not present

## 2022-04-15 DIAGNOSIS — L89313 Pressure ulcer of right buttock, stage 3: Secondary | ICD-10-CM | POA: Diagnosis not present

## 2022-04-15 DIAGNOSIS — E1151 Type 2 diabetes mellitus with diabetic peripheral angiopathy without gangrene: Secondary | ICD-10-CM | POA: Diagnosis not present

## 2022-04-15 DIAGNOSIS — I2510 Atherosclerotic heart disease of native coronary artery without angina pectoris: Secondary | ICD-10-CM | POA: Diagnosis not present

## 2022-04-15 NOTE — Progress Notes (Addendum)
[name], [name] ([id number]) Visit Report for 04/15/2022 Arrival Information Details Patient Name: [name], [name] Date of Service: 04/15/2022 9:15 AM Medical Record Number: [id number] Patient Account Number: [id number] Date of Birth/Sex: 1932/09/21 (88 y.o. F) Treating RN: Levora Dredge Primary Care Remmie Bembenek: Otilio Miu Other Clinician: Referring Sohaib Vereen: Otilio Miu Treating Blanche Gallien/Extender: Skipper Cliche in Treatment: 6 Visit Information History Since Last Visit Added or deleted any medications: No Patient Arrived: Wheel Chair Any new allergies or adverse reactions: No Arrival Time: 09:34 Had a fall or experienced change in No Accompanied By: family activities of daily living that may affect Transfer Assistance: EasyPivot Patient risk of falls: Lift Hospitalized since last visit: No Patient Identification Verified: Yes Has Dressing in Place as Prescribed: Yes Secondary Verification Process Completed: Yes Pain Present Now: No Patient Requires Transmission-Based No Precautions: Patient Has Alerts: Yes Patient Alerts: DIABETIC Electronic Signature(s) Signed: 04/15/2022 4:31:40 PM By: Levora Dredge Entered By: Levora Dredge on 04/15/2022 09:34:36 [name] ([id number]) -------------------------------------------------------------------------------- Clinic Level of Care Assessment Details Patient Name: [name] Date of Service: 04/15/2022 9:15 AM Medical Record Number: [id number] Patient Account Number: [id number] Date of Birth/Sex: 09-01-1932 (88 y.o. F) Treating RN: Levora Dredge Primary Care Natsuko Kelsay: Otilio Miu Other Clinician: Referring Aragorn Recker: Otilio Miu Treating Lindsay Straka/Extender: Skipper Cliche in Treatment: 6 Clinic Level of Care Assessment Items TOOL 4 Quantity Score _0  - Use when only an EandM is performed on FOLLOW-UP visit 0 ASSESSMENTS - Nursing Assessment / Reassessment X - Reassessment of Co-morbidities  (includes updates in patient status) 1 10 _1  - 0 Reassessment of Adherence to Treatment Plan ASSESSMENTS - Wound and Skin Assessment / Reassessment _2  - Simple Wound Assessment / Reassessment - one wound 0 X- 2 5 Complex Wound Assessment / Reassessment - multiple wounds _3  - 0 Dermatologic / Skin Assessment (not related to wound area) ASSESSMENTS - Focused Assessment _4  - Circumferential Edema Measurements - multi extremities 0 _5  - 0 Nutritional Assessment / Counseling / Intervention _6  - 0 Lower Extremity Assessment (monofilament, tuning fork, pulses) _7  - 0 Peripheral Arterial Disease Assessment (using hand held doppler) ASSESSMENTS - Ostomy and/or Continence Assessment and Care _8  - Incontinence Assessment and Management 0 _9  - 0 Ostomy Care Assessment and Management (repouching, etc.) PROCESS - Coordination of Care X - Simple Patient / Family Education for ongoing care 1 15 _10  - 0 Complex (extensive) Patient / Family Education for ongoing care _11  - 0 Staff obtains Programmer, systems, Records, Test Results / Process Orders _12  - 0 Staff telephones HHA, Nursing Homes / Clarify orders / etc _13  - 0 Routine Transfer to another Facility (non-emergent condition) _14  - 0 Routine Hospital Admission (non-emergent condition) _15  - 0 New Admissions / Biomedical engineer / Ordering NPWT, Apligraf, etc. _16  - 0 Emergency Hospital Admission (emergent condition) X- 1 10 Simple Discharge Coordination _17  - 0 Complex (extensive) Discharge Coordination PROCESS - Special Needs _18  - Pediatric / Minor Patient Management 0 _19  - 0 Isolation Patient Management _20  - 0 Hearing / Language / Visual special needs _21  - 0 Assessment of Community assistance (transportation, D/C planning, etc.) _22  - 0 Additional assistance / Altered mentation _23  - 0 Support Surface(s) Assessment (bed, cushion, seat, etc.) INTERVENTIONS - Wound Cleansing / Measurement [name], [name]. ([id number]) _24  - 0 Simple Wound  Cleansing - one wound X- 2 5 Complex Wound Cleansing - multiple wounds X- 1 5 Wound Imaging (photographs - any number of wounds) _25  - 0 Wound Tracing (instead of photographs) _26  - 0  Simple Wound Measurement - one wound X- 2 5 Complex Wound Measurement - multiple wounds INTERVENTIONS - Wound Dressings X - Small Wound Dressing one or multiple wounds 2 10 _0  - 0 Medium Wound Dressing one or multiple wounds _1  - 0 Large Wound Dressing one or multiple wounds X- 1 5 Application of Medications - topical <ZOXWRUEAVWUJWJXB>_1<\/YNWGNFAOZHYQMVHQ>_4  - 0 Application of Medications - injection INTERVENTIONS - Miscellaneous _3  - External ear exam 0 _4  - 0 Specimen Collection (cultures, biopsies, blood, body fluids, etc.) _5  - 0 Specimen(s) / Culture(s) sent or taken to Lab for analysis _6  - 0 Patient Transfer (multiple staff / Civil Service fast streamer / Similar devices) _7  - 0 Simple Staple / Suture removal (25 or less) _8  - 0 Complex Staple / Suture removal (26 or more) _9  - 0 Hypo / Hyperglycemic Management (close monitor of Blood Glucose) _10  - 0 Ankle / Brachial Index (ABI) - do not check if billed separately X- 1 5 Vital Signs Has the patient been seen at the hospital within the last three years: Yes Total Score: 100 Level Of Care: New/Established - Level 3 Electronic Signature(s) Signed: 04/15/2022 4:31:40 PM By: Levora Dredge Entered By: Levora Dredge on 04/15/2022 10:19:58 [name] ([id number]) -------------------------------------------------------------------------------- Encounter Discharge Information Details Patient Name: [name] Date of Service: 04/15/2022 9:15 AM Medical Record Number: [id number] Patient Account Number: [id number] Date of Birth/Sex: 12/28/1931 (88 y.o. F) Treating RN: Levora Dredge Primary Care Macall Mccroskey: Otilio Miu Other Clinician: Referring Azzie Thiem: Otilio Miu Treating Carri Spillers/Extender: Skipper Cliche in Treatment: 6 Encounter Discharge Information Items Post  Procedure Vitals Discharge Condition: Stable Temperature (F): 97.7 Ambulatory Status: Wheelchair Pulse (bpm): 73 Discharge Destination: Home Respiratory Rate (breaths/min): 18 Transportation: Private Auto Blood Pressure (mmHg): 170/85 Accompanied By: daughter Schedule Follow-up Appointment: Yes Clinical Summary of Care: Electronic Signature(s) Signed: 04/15/2022 4:31:40 PM By: Levora Dredge Entered By: Levora Dredge on 04/15/2022 10:21:01 [name] ([id number]) -------------------------------------------------------------------------------- Lower Extremity Assessment Details Patient Name: [name] Date of Service: 04/15/2022 9:15 AM Medical Record Number: [id number] Patient Account Number: [id number] Date of Birth/Sex: 08-25-1932 (88 y.o. F) Treating RN: Levora Dredge Primary Care Donnalyn Juran: Otilio Miu Other Clinician: Referring Aubreana Cornacchia: Otilio Miu Treating Tyrann Donaho/Extender: Jeri Cos Weeks in Treatment: 6 Electronic Signature(s) Signed: 04/15/2022 4:31:40 PM By: Levora Dredge Entered By: Levora Dredge on 04/15/2022 09:44:13 [name], [name] ([id number]) -------------------------------------------------------------------------------- Multi Wound Chart Details Patient Name: [name] Date of Service: 04/15/2022 9:15 AM Medical Record Number: [id number] Patient Account Number: [id number] Date of Birth/Sex: 1931-12-24 (88 y.o. F) Treating RN: Levora Dredge Primary Care Sydna Brodowski: Otilio Miu Other Clinician: Referring Bridgit Eynon: Otilio Miu Treating Raechelle Sarti/Extender: Jeri Cos Weeks in Treatment: 6 Vital Signs Height(in): 62 Pulse(bpm): 71 Weight(lbs): 174 Blood Pressure(mmHg): 170/85 Body Mass Index(BMI): 30.8 Temperature(F): 97.7 Respiratory Rate(breaths/min): 18 Photos: [N/A:N/A] Wound Location: Left Gluteus Right Gluteus N/A Wounding Event: Gradually Appeared Gradually Appeared N/A Primary Etiology: Pressure Ulcer  Pressure Ulcer N/A Comorbid History: Arrhythmia, Coronary Artery Arrhythmia, Coronary Artery N/A Disease, Hypertension, Type II Disease, Hypertension, Type II Diabetes, Osteoarthritis Diabetes, Osteoarthritis Date Acquired: 02/04/2022 04/07/2022 N/A Weeks of Treatment: 6 0 N/A Wound Status: Open Open N/A Wound Recurrence: No No N/A Measurements L x W x D (cm) 0.1x0.1x0.1 0.5x1.3x0.1 N/A Area (cm) : 0.008 0.511 N/A Volume (cm) : 0.001 0.051 N/A % Reduction in Area: 94.90% N/A N/A % Reduction in Volume: 93.80% N/A N/A Classification: Category/Stage III Category/Stage III N/A Exudate Amount: None Present Medium N/A Exudate Type: N/A Serosanguineous N/A Exudate Color: N/A red,  brown N/A Granulation Amount: None Present (0%) Large (67-100%) N/A Granulation Quality: N/A Red, Pink N/A Necrotic Amount: None Present (0%) Small (1-33%) N/A Exposed Structures: Fascia: No Fat Layer (Subcutaneous Tissue): N/A Fat Layer (Subcutaneous Tissue): Yes No Tendon: No Muscle: No Joint: No Bone: No Epithelialization: Large (67-100%) Small (1-33%) N/A Treatment Notes Electronic Signature(s) Signed: 04/15/2022 4:31:40 PM By: Levora Dredge Entered By: Levora Dredge on 04/15/2022 10:02:50 [name] ([id number]) -------------------------------------------------------------------------------- Multi-Disciplinary Care Plan Details Patient Name: [name] Date of Service: 04/15/2022 9:15 AM Medical Record Number: [id number] Patient Account Number: [id number] Date of Birth/Sex: 10-07-1932 (88 y.o. F) Treating RN: Levora Dredge Primary Care Margerie Fraiser: Otilio Miu Other Clinician: Referring Maisen Klingler: Otilio Miu Treating Quanasia Defino/Extender: Skipper Cliche in Treatment: 6 Active Inactive Pressure Nursing Diagnoses: Knowledge deficit related to causes and risk factors for pressure ulcer development Knowledge deficit related to management of pressures ulcers Potential for impaired  tissue integrity related to pressure, friction, moisture, and shear Goals: Patient will remain free from development of additional pressure ulcers Date Initiated: 03/04/2022 Target Resolution Date: 04/15/2022 Goal Status: Active Patient/caregiver will verbalize risk factors for pressure ulcer development Date Initiated: 03/04/2022 Target Resolution Date: 04/15/2022 Goal Status: Active Interventions: Assess: immobility, friction, shearing, incontinence upon admission and as needed Assess offloading mechanisms upon admission and as needed Assess potential for pressure ulcer upon admission and as needed Notes: Electronic Signature(s) Signed: 04/15/2022 4:31:40 PM By: Levora Dredge Entered By: Levora Dredge on 04/15/2022 10:02:39 [name] ([id number]) -------------------------------------------------------------------------------- Pain Assessment Details Patient Name: [name] Date of Service: 04/15/2022 9:15 AM Medical Record Number: [id number] Patient Account Number: [id number] Date of Birth/Sex: 01/15/1932 (88 y.o. F) Treating RN: Levora Dredge Primary Care Kazuo Durnil: Otilio Miu Other Clinician: Referring Huxley Shurley: Otilio Miu Treating Derenda Giddings/Extender: Skipper Cliche in Treatment: 6 Active Problems Location of Pain Severity and Description of Pain Patient Has Paino No Site Locations Rate the pain. Current Pain Level: 0 Pain Management and Medication Current Pain Management: Electronic Signature(s) Signed: 04/15/2022 4:31:40 PM By: Levora Dredge Entered By: Levora Dredge on 04/15/2022 09:35:02 [name] ([id number]) -------------------------------------------------------------------------------- Patient/Caregiver Education Details Patient Name: [name] Date of Service: 04/15/2022 9:15 AM Medical Record Number: [id number] Patient Account Number: [id number] Date of Birth/Gender: 06/15/1932 (88 y.o. F) Treating RN: Levora Dredge Primary Care Physician: Otilio Miu Other Clinician: Referring Physician: Otilio Miu Treating Physician/Extender: Skipper Cliche in Treatment: 6 Education Assessment Education Provided To: Patient and Caregiver Education Topics Provided Pressure: Handouts: Pressure Ulcers: Care and Offloading Methods: Explain/Verbal Responses: State content correctly Wound/Skin Impairment: Handouts: Caring for Your Ulcer Methods: Explain/Verbal Responses: State content correctly Electronic Signature(s) Signed: 04/15/2022 4:31:40 PM By: Levora Dredge Entered By: Levora Dredge on 04/15/2022 10:20:19 [name] ([id number]) -------------------------------------------------------------------------------- Wound Assessment Details Patient Name: [name] Date of Service: 04/15/2022 9:15 AM Medical Record Number: [id number] Patient Account Number: [id number] Date of Birth/Sex: 04-06-1932 (88 y.o. F) Treating RN: Levora Dredge Primary Care Callin Ashe: Otilio Miu Other Clinician: Referring Vyron Fronczak: Otilio Miu Treating Jamiel Goncalves/Extender: Jeri Cos Weeks in Treatment: 6 Wound Status Wound Number: 1 Primary Pressure Ulcer Etiology: Wound Location: Left Gluteus Wound Open Wounding Event: Gradually Appeared Status: Date Acquired: 02/04/2022 Comorbid Arrhythmia, Coronary Artery Disease, Hypertension, Weeks Of Treatment: 6 History: Type II Diabetes, Osteoarthritis Clustered Wound: No Photos Wound Measurements Length: (cm) 0.1 Width: (cm) 0.1 Depth: (cm) 0.1 Area: (cm) 0.008 Volume: (cm) 0.001 % Reduction in Area: 94.9% % Reduction in Volume: 93.8% Epithelialization: Medium (34-66%) Tunneling: No Undermining: No  Wound Description Classification: Category/Stage III Exudate Amount: None Present Foul Odor After Cleansing: No Slough/Fibrino No Wound Bed Granulation Amount: Large (67-100%) Exposed Structure Granulation Quality: Pink Fascia Exposed:  No Necrotic Amount: Small (1-33%) Fat Layer (Subcutaneous Tissue) Exposed: Yes Necrotic Quality: Adherent Slough Tendon Exposed: No Muscle Exposed: No Joint Exposed: No Bone Exposed: No Treatment Notes Wound #1 (Gluteus) Wound Laterality: Left Cleanser Byram Ancillary Kit - 15 Day Supply Discharge Instruction: Use supplies as instructed; Kit contains: (15) Saline Bullets; (15) 3x3 Gauze; 15 pr Gloves Soap and Water Discharge Instruction: Gently cleanse wound with antibacterial soap, rinse and pat dry prior to dressing wounds Wound Cleanser [name], [name] ([id number]) Discharge Instruction: Wash your hands with soap and water. Remove old dressing, discard into plastic bag and place into trash. Cleanse the wound with Wound Cleanser prior to applying a clean dressing using gauze sponges, not tissues or cotton balls. Do not scrub or use excessive force. Pat dry using gauze sponges, not tissue or cotton balls. Peri-Wound Care Topical Primary Dressing Xeroform 4x4-HBD (in/in) Discharge Instruction: Apply Xeroform 4x4-HBD (in/in) as directed Secondary Dressing (SILICONE BORDER) Zetuvit Plus SILICONE BORDER Dressing 4x4 (in/in) Discharge Instruction: Please do not put silicone bordered dressings under wraps. Use non-bordered dressing only. Secured With Compression Wrap Compression Stockings Add-Ons Electronic Signature(s) Signed: 04/15/2022 4:31:40 PM By: Levora Dredge Entered By: Levora Dredge on 04/15/2022 10:05:02 [name], [name] ([id number]) -------------------------------------------------------------------------------- Wound Assessment Details Patient Name: [name] Date of Service: 04/15/2022 9:15 AM Medical Record Number: [id number] Patient Account Number: [id number] Date of Birth/Sex: 19-Feb-1932 (88 y.o. F) Treating RN: Levora Dredge Primary Care Arrington Yohe: Otilio Miu Other Clinician: Referring Breasia Karges: Otilio Miu Treating Emmauel Hallums/Extender: Jeri Cos Weeks in Treatment: 6 Wound Status Wound Number: 2 Primary Pressure Ulcer Etiology: Wound Location: Right Gluteus Wound Open Wounding Event: Gradually Appeared Status: Date Acquired: 04/07/2022 Comorbid Arrhythmia, Coronary Artery Disease, Hypertension, Weeks Of Treatment: 0 History: Type II Diabetes, Osteoarthritis Clustered Wound: No Photos Wound Measurements Length: (cm) 0.5 Width: (cm) 1.3 Depth: (cm) 0.1 Area: (cm) 0.511 Volume: (cm) 0.051 % Reduction in Area: % Reduction in Volume: Epithelialization: Small (1-33%) Tunneling: No Undermining: No Wound Description Classification: Category/Stage III Exudate Amount: Medium Exudate Type: Serosanguineous Exudate Color: red, brown Foul Odor After Cleansing: No Slough/Fibrino Yes Wound Bed Granulation Amount: Large (67-100%) Exposed Structure Granulation Quality: Red, Pink Fat Layer (Subcutaneous Tissue) Exposed: Yes Necrotic Amount: Small (1-33%) Necrotic Quality: Adherent Slough Treatment Notes Wound #2 (Gluteus) Wound Laterality: Right Cleanser Byram Ancillary Kit - 15 Day Supply Discharge Instruction: Use supplies as instructed; Kit contains: (15) Saline Bullets; (15) 3x3 Gauze; 15 pr Gloves Soap and Water Discharge Instruction: Gently cleanse wound with antibacterial soap, rinse and pat dry prior to dressing wounds Wound Cleanser Discharge Instruction: Wash your hands with soap and water. Remove old dressing, discard into plastic bag and place into trash. Cleanse the wound with Wound Cleanser prior to applying a clean dressing using gauze sponges, not tissues or cotton balls. Do not [name], [name]. ([id number]) scrub or use excessive force. Pat dry using gauze sponges, not tissue or cotton balls. Peri-Wound Care Topical Primary Dressing Xeroform 4x4-HBD (in/in) Discharge Instruction: Apply Xeroform 4x4-HBD (in/in) as directed Secondary Dressing (SILICONE BORDER) Zetuvit Plus SILICONE BORDER Dressing  4x4 (in/in) Discharge Instruction: Please do not put silicone bordered dressings under wraps. Use non-bordered dressing only. Secured With Compression Wrap Compression Stockings Add-Ons Electronic Signature(s) Signed: 04/15/2022 4:31:40 PM By: Levora Dredge Entered By: Levora Dredge on 04/15/2022 09:44:04 [name], [name] ([id number]) -------------------------------------------------------------------------------- Vitals Details Patient Name: [name] Date of Service: 04/15/2022 9:15 AM Medical Record Number: [id number] Patient Account Number: [id number] Date of Birth/Sex: 1932-11-12 (88 y.o. F) Treating RN: Levora Dredge Primary Care Cloyce Paterson: Otilio Miu Other Clinician: Referring Cuca Benassi: Otilio Miu Treating Randolf Sansoucie/Extender: Jeri Cos Weeks in Treatment: 6 Vital Signs Time Taken: 09:30 Temperature (F): 97.7 Height (in): 63 Pulse (bpm): 73 Weight (lbs): 174 Respiratory Rate (breaths/min): 18 Body Mass Index (BMI): 30.8 Blood Pressure (mmHg): 170/85 Reference Range: 80 - 120 mg / dl Electronic Signature(s) Signed: 04/15/2022 4:31:40 PM By: Levora Dredge Entered By: Levora Dredge on 04/15/2022 09:34:55

## 2022-04-15 NOTE — Progress Notes (Addendum)
[name] ([id number]) Visit Report for 04/15/2022 Chief Complaint Document Details Patient Name: [name], [name] Date of Service: 04/15/2022 9:15 AM Medical Record Number: [id number] Patient Account Number: [id number] Date of Birth/Sex: 03/12/1932 (88 y.o. F) Treating RN: Levora Dredge Primary Care Provider: Otilio Miu Other Clinician: Referring Provider: Otilio Miu Treating Provider/Extender: Jeri Cos Weeks in Treatment: 6 Information Obtained from: Patient Chief Complaint Bilateral buttock ulcers Electronic Signature(s) Signed: 04/15/2022 10:00:39 AM By: Worthy Keeler PA-C Previous Signature: 04/15/2022 9:28:25 AM Version By: Worthy Keeler PA-C Entered By: Worthy Keeler on 04/15/2022 10:00:39 [name] ([id number]) -------------------------------------------------------------------------------- Debridement Details Patient Name: [name] Date of Service: 04/15/2022 9:15 AM Medical Record Number: [id number] Patient Account Number: [id number] Date of Birth/Sex: 1932-08-28 (88 y.o. F) Treating RN: Levora Dredge Primary Care Provider: Otilio Miu Other Clinician: Referring Provider: Otilio Miu Treating Provider/Extender: Jeri Cos Weeks in Treatment: 6 Debridement Performed for Wound #2 Right Gluteus Assessment: Performed By: Physician Tommie Sams., PA-C Debridement Type: Chemical/Enzymatic/Mechanical Agent Used: saline gauze Level of Consciousness (Pre- Awake and Alert procedure): Pre-procedure Verification/Time Out Yes - 10:10 Taken: Pain Control: Lidocaine 4% Topical Solution Instrument: Other : saline gauze Bleeding: None Response to Treatment: Procedure was tolerated well Level of Consciousness (Post- Awake and Alert procedure): Post Debridement Measurements of Total Wound Length: (cm) 0.5 Stage: Category/Stage III Width: (cm) 1.3 Depth: (cm) 0.1 Volume: (cm) 0.051 Character of Wound/Ulcer Post Debridement:  Stable Post Procedure Diagnosis Same as Pre-procedure Electronic Signature(s) Signed: 04/15/2022 4:31:40 PM By: Levora Dredge Signed: 04/15/2022 4:54:58 PM By: Worthy Keeler PA-C Entered By: Levora Dredge on 04/15/2022 10:18:48 [name] ([id number]) -------------------------------------------------------------------------------- HPI Details Patient Name: [name] Date of Service: 04/15/2022 9:15 AM Medical Record Number: [id number] Patient Account Number: [id number] Date of Birth/Sex: 1932-02-28 (88 y.o. F) Treating RN: Levora Dredge Primary Care Provider: Otilio Miu Other Clinician: Referring Provider: Otilio Miu Treating Provider/Extender: Skipper Cliche in Treatment: 6 History of Present Illness HPI Description: 03-04-2022 upon evaluation today patient appears to be doing poorly in regard to her gluteal region. Its mainly in the left gluteal region where there is an open ulceration at this point. This does appear to be a stage III pressure ulcer. Fortunately there does not appear to be any evidence of active infection at this site locally nor systemically which is also news. The patient has been counseled by her daughter who is present with her today her son was also on the phone we had a good discussion at this point. Nonetheless they have been trying to get her to not sit as long as they knew that was the major issue the patient tells me however she has a hard time getting up because of her knee. She has had of extensive work-up and treatment for the knee and fortunately has been determined this is just arthritis and there is really not much more that they can do. Nonetheless I think she is going to have to get up and move around or at least get up and stand and allow for blood flow into the gluteal area or out she is can end up with more significant wounds than what we have and see right now. Patient does have a history of diabetes mellitus type 2,  hypertension, and coronary artery disease. This wound has been present for at this point roughly 1 month beginning on or around February 04, 2022. 03-21-2022 upon evaluation today patient's wound actually showing signs of excellent improvement I  am very pleased with where we stand today there does not appear to be any signs of infection which is great news and overall I think we are headed in the right direction. This is not terribly smaller but it is also looking better than what it was as far as the health of the surface of the wound is concerned. 04-15-2022 upon evaluation today patient appears to be doing well in regard to 1 side of her gluteal area not as well and the other that was healed last time. The right side is reopened which was previously closed. The left side is not as bad and in fact is very close to complete closure. Fortunately I do not see any evidence of active infection locally or systemically at this time which is great news. Overall I think that we are headed in the right direction but still regular probably need to put something on both sides to protect at this point and try to get these areas healed. Electronic Signature(s) Signed: 04/15/2022 11:02:18 AM By: Worthy Keeler PA-C Entered By: Worthy Keeler on 04/15/2022 11:02:18 [name] ([id number]) -------------------------------------------------------------------------------- Physical Exam Details Patient Name: [name] Date of Service: 04/15/2022 9:15 AM Medical Record Number: [id number] Patient Account Number: [id number] Date of Birth/Sex: 12/22/1931 (88 y.o. F) Treating RN: Levora Dredge Primary Care Provider: Otilio Miu Other Clinician: Referring Provider: Otilio Miu Treating Provider/Extender: Jeri Cos Weeks in Treatment: 6 Constitutional Obese and well-hydrated in no acute distress. Respiratory normal breathing without difficulty. Psychiatric this patient is able to make decisions and  demonstrates good insight into disease process. Alert and Oriented x 3. pleasant and cooperative. Notes Upon inspection patient's wound bed actually showed signs of good granulation and a physician at this point. Fortunately I do not see any evidence of infection locally or systemically which is great news and overall I am extremely pleased with where things stand. With that being said both gluteal areas are open this is right where she sits and also probably scoots and I think that is a big part of the issue here. Electronic Signature(s) Signed: 04/15/2022 11:02:38 AM By: Worthy Keeler PA-C Entered By: Worthy Keeler on 04/15/2022 11:02:38 [name] ([id number]) -------------------------------------------------------------------------------- Physician Orders Details Patient Name: [name] Date of Service: 04/15/2022 9:15 AM Medical Record Number: [id number] Patient Account Number: [id number] Date of Birth/Sex: 07/27/1932 (88 y.o. F) Treating RN: Levora Dredge Primary Care Provider: Otilio Miu Other Clinician: Referring Provider: Otilio Miu Treating Provider/Extender: Skipper Cliche in Treatment: 6 Verbal / Phone Orders: No Diagnosis Coding ICD-10 Coding Code Description [phone] Pressure ulcer of left buttock, stage 3 L89.313 Pressure ulcer of right buttock, stage 3 E11.622 Type 2 diabetes mellitus with other skin ulcer I10 Essential (primary) hypertension I25.10 Atherosclerotic heart disease of native coronary artery without angina pectoris Follow-up Appointments o Return Appointment in 2 weeks. Bathing/ Shower/ Hygiene o Wash wounds with antibacterial soap and water. - change dressing after shower o No tub bath. Anesthetic (Use 'Patient Medications' Section for Anesthetic Order Entry) o Lidocaine applied to wound bed Edema Control - Lymphedema / Segmental Compressive Device / Other o DO YOUR BEST to sleep in the bed at night. DO NOT sleep in  your recliner. Long hours of sitting in a recliner leads to swelling of the legs and/or potential wounds on your backside. - Try to sleep in the bed Off-Loading o Gel wheelchair cushion o Turn and reposition every 2 hours Additional Orders / Instructions o Follow  Nutritious Diet and Increase Protein Intake - monitor blood sugar also Wound Treatment Wound #1 - Gluteus Wound Laterality: Left Cleanser: Byram Ancillary Kit - 15 Day Supply (Generic) 1 x Per Day/15 Days Discharge Instructions: Use supplies as instructed; Kit contains: (15) Saline Bullets; (15) 3x3 Gauze; 15 pr Gloves Cleanser: Soap and Water 1 x Per Day/15 Days Discharge Instructions: Gently cleanse wound with antibacterial soap, rinse and pat dry prior to dressing wounds Cleanser: Wound Cleanser 1 x Per Day/15 Days Discharge Instructions: Wash your hands with soap and water. Remove old dressing, discard into plastic bag and place into trash. Cleanse the wound with Wound Cleanser prior to applying a clean dressing using gauze sponges, not tissues or cotton balls. Do not scrub or use excessive force. Pat dry using gauze sponges, not tissue or cotton balls. Primary Dressing: Xeroform 4x4-HBD (in/in) (Generic) 1 x Per Day/15 Days Discharge Instructions: Apply Xeroform 4x4-HBD (in/in) as directed Secondary Dressing: (SILICONE BORDER) Zetuvit Plus SILICONE BORDER Dressing 4x4 (in/in) (DME) (Generic) 1 x Per Day/15 Days Discharge Instructions: Please do not put silicone bordered dressings under wraps. Use non-bordered dressing only. Wound #2 - Gluteus Wound Laterality: Right Cleanser: Byram Ancillary Kit - 15 Day Supply (Generic) 1 x Per Day/15 Days Discharge Instructions: Use supplies as instructed; Kit contains: (15) Saline Bullets; (15) 3x3 Gauze; 15 pr Gloves Cleanser: Soap and Water 1 x Per Day/15 Days KAILLY, RICHOUX ([id number]) Discharge Instructions: Gently cleanse wound with antibacterial soap, rinse and pat dry prior  to dressing wounds Cleanser: Wound Cleanser 1 x Per Day/15 Days Discharge Instructions: Wash your hands with soap and water. Remove old dressing, discard into plastic bag and place into trash. Cleanse the wound with Wound Cleanser prior to applying a clean dressing using gauze sponges, not tissues or cotton balls. Do not scrub or use excessive force. Pat dry using gauze sponges, not tissue or cotton balls. Primary Dressing: Xeroform 4x4-HBD (in/in) (Generic) 1 x Per Day/15 Days Discharge Instructions: Apply Xeroform 4x4-HBD (in/in) as directed Secondary Dressing: (SILICONE BORDER) Zetuvit Plus SILICONE BORDER Dressing 4x4 (in/in) (DME) (Generic) 1 x Per Day/15 Days Discharge Instructions: Please do not put silicone bordered dressings under wraps. Use non-bordered dressing only. Electronic Signature(s) Signed: 04/15/2022 4:31:40 PM By: Levora Dredge Signed: 04/15/2022 4:54:58 PM By: Worthy Keeler PA-C Entered By: Levora Dredge on 04/15/2022 10:19:16 [name] ([id number]) -------------------------------------------------------------------------------- Problem List Details Patient Name: [name] Date of Service: 04/15/2022 9:15 AM Medical Record Number: [id number] Patient Account Number: [id number] Date of Birth/Sex: 01/29/1932 (88 y.o. F) Treating RN: Levora Dredge Primary Care Provider: Otilio Miu Other Clinician: Referring Provider: Otilio Miu Treating Provider/Extender: Jeri Cos Weeks in Treatment: 6 Active Problems ICD-10 Encounter Code Description Active Date MDM Diagnosis [phone] Pressure ulcer of left buttock, stage 3 03/04/2022 No Yes L89.313 Pressure ulcer of right buttock, stage 3 04/15/2022 No Yes E11.622 Type 2 diabetes mellitus with other skin ulcer 03/04/2022 No Yes I10 Essential (primary) hypertension 03/04/2022 No Yes I25.10 Atherosclerotic heart disease of native coronary artery without angina 03/04/2022 No Yes pectoris Inactive  Problems Resolved Problems Electronic Signature(s) Signed: 04/15/2022 10:00:27 AM By: Worthy Keeler PA-C Previous Signature: 04/15/2022 9:28:22 AM Version By: Worthy Keeler PA-C Entered By: Worthy Keeler on 04/15/2022 10:00:27 [name] ([id number]) -------------------------------------------------------------------------------- Progress Note Details Patient Name: [name] Date of Service: 04/15/2022 9:15 AM Medical Record Number: [id number] Patient Account Number: [id number] Date of Birth/Sex: [birth year] (88 y.o. F) Treating RN: Levora Dredge Primary Care  Provider: Otilio Miu Other Clinician: Referring Provider: Otilio Miu Treating Provider/Extender: Skipper Cliche in Treatment: 6 Subjective Chief Complaint Information obtained from Patient Bilateral buttock ulcers History of Present Illness (HPI) 03-04-2022 upon evaluation today patient appears to be doing poorly in regard to her gluteal region. Its mainly in the left gluteal region where there is an open ulceration at this point. This does appear to be a stage III pressure ulcer. Fortunately there does not appear to be any evidence of active infection at this site locally nor systemically which is also news. The patient has been counseled by her daughter who is present with her today her son was also on the phone we had a good discussion at this point. Nonetheless they have been trying to get her to not sit as long as they knew that was the major issue the patient tells me however she has a hard time getting up because of her knee. She has had of extensive work-up and treatment for the knee and fortunately has been determined this is just arthritis and there is really not much more that they can do. Nonetheless I think she is going to have to get up and move around or at least get up and stand and allow for blood flow into the gluteal area or out she is can end up with more significant wounds than what we  have and see right now. Patient does have a history of diabetes mellitus type 2, hypertension, and coronary artery disease. This wound has been present for at this point roughly 1 month beginning on or around February 04, 2022. 03-21-2022 upon evaluation today patient's wound actually showing signs of excellent improvement I am very pleased with where we stand today there does not appear to be any signs of infection which is great news and overall I think we are headed in the right direction. This is not terribly smaller but it is also looking better than what it was as far as the health of the surface of the wound is concerned. 04-15-2022 upon evaluation today patient appears to be doing well in regard to 1 side of her gluteal area not as well and the other that was healed last time. The right side is reopened which was previously closed. The left side is not as bad and in fact is very close to complete closure. Fortunately I do not see any evidence of active infection locally or systemically at this time which is great news. Overall I think that we are headed in the right direction but still regular probably need to put something on both sides to protect at this point and try to get these areas healed. Objective Constitutional Obese and well-hydrated in no acute distress. Vitals Time Taken: 9:30 AM, Height: 63 in, Weight: 174 lbs, BMI: 30.8, Temperature: 97.7 F, Pulse: 73 bpm, Respiratory Rate: 18 breaths/min, Blood Pressure: 170/85 mmHg. Respiratory normal breathing without difficulty. Psychiatric this patient is able to make decisions and demonstrates good insight into disease process. Alert and Oriented x 3. pleasant and cooperative. General Notes: Upon inspection patient's wound bed actually showed signs of good granulation and a physician at this point. Fortunately I do not see any evidence of infection locally or systemically which is great news and overall I am extremely pleased with where  things stand. With that being said both gluteal areas are open this is right where she sits and also probably scoots and I think that is a big part of the issue  here. Integumentary (Hair, Skin) Wound #1 status is Open. Original cause of wound was Gradually Appeared. The date acquired was: 02/04/2022. The wound has been in treatment 6 weeks. The wound is located on the Left Gluteus. The wound measures 0.1cm length x 0.1cm width x 0.1cm depth; 0.008cm^2 area and 0.001cm^3 volume. There is Fat Layer (Subcutaneous Tissue) exposed. There is no tunneling or undermining noted. There is a none present amount of drainage noted. There is large (67-100%) pink granulation within the wound bed. There is a small (1-33%) amount of necrotic tissue within the wound bed including Adherent Slough. Wound #2 status is Open. Original cause of wound was Gradually Appeared. The date acquired was: 04/07/2022. The wound is located on the Right Gluteus. The wound measures 0.5cm length x 1.3cm width x 0.1cm depth; 0.511cm^2 area and 0.051cm^3 volume. There is Fat KOLLINS, FENTER. ([id number]) (Subcutaneous Tissue) exposed. There is no tunneling or undermining noted. There is a medium amount of serosanguineous drainage noted. There is large (67-100%) red, pink granulation within the wound bed. There is a small (1-33%) amount of necrotic tissue within the wound bed including Adherent Slough. Assessment Active Problems ICD-10 Pressure ulcer of left buttock, stage 3 Pressure ulcer of right buttock, stage 3 Type 2 diabetes mellitus with other skin ulcer Essential (primary) hypertension Atherosclerotic heart disease of native coronary artery without angina pectoris Procedures Wound #2 Pre-procedure diagnosis of Wound #2 is a Pressure Ulcer located on the Right Gluteus . There was a Chemical/Enzymatic/Mechanical debridement performed by Tommie Sams., PA-C. With the following instrument(s): saline gauze after achieving  pain control using Lidocaine 4% Topical Solution. Other agent used was saline gauze. A time out was conducted at 10:10, prior to the start of the procedure. There was no bleeding. The procedure was tolerated well. Post Debridement Measurements: 0.5cm length x 1.3cm width x 0.1cm depth; 0.051cm^3 volume. Post debridement Stage noted as Category/Stage III. Character of Wound/Ulcer Post Debridement is stable. Post procedure Diagnosis Wound #2: Same as Pre-Procedure Plan Follow-up Appointments: Return Appointment in 2 weeks. Bathing/ Shower/ Hygiene: Wash wounds with antibacterial soap and water. - change dressing after shower No tub bath. Anesthetic (Use 'Patient Medications' Section for Anesthetic Order Entry): Lidocaine applied to wound bed Edema Control - Lymphedema / Segmental Compressive Device / Other: DO YOUR BEST to sleep in the bed at night. DO NOT sleep in your recliner. Long hours of sitting in a recliner leads to swelling of the legs and/or potential wounds on your backside. - Try to sleep in the bed Off-Loading: Gel wheelchair cushion Turn and reposition every 2 hours Additional Orders / Instructions: Follow Nutritious Diet and Increase Protein Intake - monitor blood sugar also WOUND #1: - Gluteus Wound Laterality: Left Cleanser: Byram Ancillary Kit - 15 Day Supply (Generic) 1 x Per Day/15 Days Discharge Instructions: Use supplies as instructed; Kit contains: (15) Saline Bullets; (15) 3x3 Gauze; 15 pr Gloves Cleanser: Soap and Water 1 x Per Day/15 Days Discharge Instructions: Gently cleanse wound with antibacterial soap, rinse and pat dry prior to dressing wounds Cleanser: Wound Cleanser 1 x Per Day/15 Days Discharge Instructions: Wash your hands with soap and water. Remove old dressing, discard into plastic bag and place into trash. Cleanse the wound with Wound Cleanser prior to applying a clean dressing using gauze sponges, not tissues or cotton balls. Do not scrub or  use excessive force. Pat dry using gauze sponges, not tissue or cotton balls. Primary Dressing: Xeroform 4x4-HBD (in/in) (Generic) 1 x  Per Day/15 Days Discharge Instructions: Apply Xeroform 4x4-HBD (in/in) as directed Secondary Dressing: (SILICONE BORDER) Zetuvit Plus SILICONE BORDER Dressing 4x4 (in/in) (DME) (Generic) 1 x Per Day/15 Days Discharge Instructions: Please do not put silicone bordered dressings under wraps. Use non-bordered dressing only. WOUND #2: - Gluteus Wound Laterality: Right Cleanser: Byram Ancillary Kit - 15 Day Supply (Generic) 1 x Per Day/15 Days Discharge Instructions: Use supplies as instructed; Kit contains: (15) Saline Bullets; (15) 3x3 Gauze; 15 pr Gloves Cleanser: Soap and Water 1 x Per Day/15 Days Discharge Instructions: Gently cleanse wound with antibacterial soap, rinse and pat dry prior to dressing wounds Cleanser: Wound Cleanser 1 x Per Day/15 Days CORY, RAMA ([id number]) Discharge Instructions: Wash your hands with soap and water. Remove old dressing, discard into plastic bag and place into trash. Cleanse the wound with Wound Cleanser prior to applying a clean dressing using gauze sponges, not tissues or cotton balls. Do not scrub or use excessive force. Pat dry using gauze sponges, not tissue or cotton balls. Primary Dressing: Xeroform 4x4-HBD (in/in) (Generic) 1 x Per Day/15 Days Discharge Instructions: Apply Xeroform 4x4-HBD (in/in) as directed Secondary Dressing: (SILICONE BORDER) Zetuvit Plus SILICONE BORDER Dressing 4x4 (in/in) (DME) (Generic) 1 x Per Day/15 Days Discharge Instructions: Please do not put silicone bordered dressings under wraps. Use non-bordered dressing only. 1. I am going to recommend currently that we going to continue with the wound care measures as before the patient is in agreement with plan. This includes the use of the Xeroform gauze dressings to both which I think is good to do a good job. 2. Regular continue with a  bordered foam dressing to cover which wall for great protection. 3. Mostly in the suggested the patient continue to monitor for any signs of worsening infection. If anything changes she should let me know soon as possible. We will see patient back for reevaluation in 2 weeks here in the clinic. If anything worsens or changes patient will contact our office for additional recommendations. Electronic Signature(s) Signed: 04/15/2022 11:03:15 AM By: Worthy Keeler PA-C Entered By: Worthy Keeler on 04/15/2022 11:03:15 [name] ([id number]) -------------------------------------------------------------------------------- SuperBill Details Patient Name: [name] Date of Service: 04/15/2022 Medical Record Number: [id number] Patient Account Number: [id number] Date of Birth/Sex: 07-12-1932 (88 y.o. F) Treating RN: Levora Dredge Primary Care Provider: Otilio Miu Other Clinician: Referring Provider: Otilio Miu Treating Provider/Extender: Jeri Cos Weeks in Treatment: 6 Diagnosis Coding ICD-10 Codes Code Description [phone] Pressure ulcer of left buttock, stage 3 L89.313 Pressure ulcer of right buttock, stage 3 E11.622 Type 2 diabetes mellitus with other skin ulcer I10 Essential (primary) hypertension I25.10 Atherosclerotic heart disease of native coronary artery without angina pectoris Facility Procedures CPT4 Code: [id number] Description: 99213 - WOUND CARE VISIT-LEV 3 EST PT Modifier: Quantity: 1 Physician Procedures CPT4 Code: [id number] Description: 99213 - WC PHYS LEVEL 3 - EST PT Modifier: Quantity: 1 CPT4 Code: Description: ICD-10 Diagnosis Description L89.323 Pressure ulcer of left buttock, stage 3 L89.313 Pressure ulcer of right buttock, stage 3 E11.622 Type 2 diabetes mellitus with other skin ulcer I10 Essential (primary) hypertension Modifier: Quantity: Electronic Signature(s) Signed: 04/15/2022 11:03:42 AM By: Worthy Keeler PA-C Previous Signature:  04/15/2022 11:03:31 AM Version By: Worthy Keeler PA-C Entered By: Worthy Keeler on 04/15/2022 11:03:42

## 2022-04-17 ENCOUNTER — Other Ambulatory Visit: Payer: Self-pay | Admitting: Family Medicine

## 2022-04-17 DIAGNOSIS — J301 Allergic rhinitis due to pollen: Secondary | ICD-10-CM

## 2022-04-19 DIAGNOSIS — S31829D Unspecified open wound of left buttock, subsequent encounter: Secondary | ICD-10-CM | POA: Diagnosis not present

## 2022-04-29 ENCOUNTER — Encounter: Payer: Medicare Other | Attending: Physician Assistant | Admitting: Physician Assistant

## 2022-04-29 DIAGNOSIS — I2510 Atherosclerotic heart disease of native coronary artery without angina pectoris: Secondary | ICD-10-CM | POA: Diagnosis not present

## 2022-04-29 DIAGNOSIS — I10 Essential (primary) hypertension: Secondary | ICD-10-CM | POA: Diagnosis not present

## 2022-04-29 DIAGNOSIS — E11622 Type 2 diabetes mellitus with other skin ulcer: Secondary | ICD-10-CM | POA: Diagnosis not present

## 2022-04-29 DIAGNOSIS — L89323 Pressure ulcer of left buttock, stage 3: Secondary | ICD-10-CM | POA: Diagnosis not present

## 2022-04-29 DIAGNOSIS — L89313 Pressure ulcer of right buttock, stage 3: Secondary | ICD-10-CM | POA: Diagnosis not present

## 2022-04-29 NOTE — Progress Notes (Signed)
YARIAH, SELVEY ([id number]) Visit Report for 04/29/2022 Arrival Information Details Patient Name: EMANUELLE, HAMMERSTROM Date of Service: 04/29/2022 9:15 AM Medical Record Number: [id number] Patient Account Number: [id number] Date of Birth/Sex: 1932-02-16 (88 y.o. F) Treating RN: Levora Dredge Primary Care Solana Coggin: Otilio Miu Other Clinician: Referring Markesha Hannig: Otilio Miu Treating Titan Karner/Extender: Skipper Cliche in Treatment: 8 Visit Information History Since Last Visit Added or deleted any medications: No Patient Arrived: Wheel Chair Any new allergies or adverse reactions: No Arrival Time: 09:29 Had a fall or experienced change in No Accompanied By: daughter activities of daily living that may affect Transfer Assistance: EasyPivot Patient risk of falls: Lift Hospitalized since last visit: No Patient Identification Verified: Yes Has Dressing in Place as Prescribed: Yes Secondary Verification Process Completed: Yes Pain Present Now: No Patient Requires Transmission-Based No Precautions: Patient Has Alerts: Yes Patient Alerts: DIABETIC Electronic Signature(s) Signed: 04/29/2022 4:15:12 PM By: Levora Dredge Entered By: Levora Dredge on 04/29/2022 09:30:04 [name] ([id number]) -------------------------------------------------------------------------------- Clinic Level of Care Assessment Details Patient Name: [name] Date of Service: 04/29/2022 9:15 AM Medical Record Number: [id number] Patient Account Number: [id number] Date of Birth/Sex: 1931-12-31 (88 y.o. F) Treating RN: Levora Dredge Primary Care Idalie Canto: Otilio Miu Other Clinician: Referring Chianne Byrns: Otilio Miu Treating Gevin Perea/Extender: Skipper Cliche in Treatment: 8 Clinic Level of Care Assessment Items TOOL 4 Quantity Score []  - Use when only an EandM is performed on FOLLOW-UP visit 0 ASSESSMENTS - Nursing Assessment / Reassessment X - Reassessment of Co-morbidities (includes  updates in patient status) 1 10 []  - 0 Reassessment of Adherence to Treatment Plan ASSESSMENTS - Wound and Skin Assessment / Reassessment []  - Simple Wound Assessment / Reassessment - one wound 0 X- 2 5 Complex Wound Assessment / Reassessment - multiple wounds []  - 0 Dermatologic / Skin Assessment (not related to wound area) ASSESSMENTS - Focused Assessment []  - Circumferential Edema Measurements - multi extremities 0 []  - 0 Nutritional Assessment / Counseling / Intervention []  - 0 Lower Extremity Assessment (monofilament, tuning fork, pulses) []  - 0 Peripheral Arterial Disease Assessment (using hand held doppler) ASSESSMENTS - Ostomy and/or Continence Assessment and Care []  - Incontinence Assessment and Management 0 []  - 0 Ostomy Care Assessment and Management (repouching, etc.) PROCESS - Coordination of Care X - Simple Patient / Family Education for ongoing care 1 15 []  - 0 Complex (extensive) Patient / Family Education for ongoing care []  - 0 Staff obtains Programmer, systems, Records, Test Results / Process Orders []  - 0 Staff telephones HHA, Nursing Homes / Clarify orders / etc []  - 0 Routine Transfer to another Facility (non-emergent condition) []  - 0 Routine Hospital Admission (non-emergent condition) []  - 0 New Admissions / Biomedical engineer / Ordering NPWT, Apligraf, etc. []  - 0 Emergency Hospital Admission (emergent condition) X- 1 10 Simple Discharge Coordination []  - 0 Complex (extensive) Discharge Coordination PROCESS - Special Needs []  - Pediatric / Minor Patient Management 0 []  - 0 Isolation Patient Management []  - 0 Hearing / Language / Visual special needs []  - 0 Assessment of Community assistance (transportation, D/C planning, etc.) []  - 0 Additional assistance / Altered mentation []  - 0 Support Surface(s) Assessment (bed, cushion, seat, etc.) INTERVENTIONS - Wound Cleansing / Measurement DUNYA, MEINERS. ([id number]) []  - 0 Simple Wound Cleansing  - one wound X- 2 5 Complex Wound Cleansing - multiple wounds X- 1 5 Wound Imaging (photographs - any number of wounds) []  - 0 Wound Tracing (instead of photographs) []  - 0  Simple Wound Measurement - one wound X- 2 5 Complex Wound Measurement - multiple wounds INTERVENTIONS - Wound Dressings X - Small Wound Dressing one or multiple wounds 2 10 []  - 0 Medium Wound Dressing one or multiple wounds []  - 0 Large Wound Dressing one or multiple wounds X- 1 5 Application of Medications - topical []  - 0 Application of Medications - injection INTERVENTIONS - Miscellaneous []  - External ear exam 0 []  - 0 Specimen Collection (cultures, biopsies, blood, body fluids, etc.) []  - 0 Specimen(s) / Culture(s) sent or taken to Lab for analysis []  - 0 Patient Transfer (multiple staff / Civil Service fast streamer / Similar devices) []  - 0 Simple Staple / Suture removal (25 or less) []  - 0 Complex Staple / Suture removal (26 or more) []  - 0 Hypo / Hyperglycemic Management (close monitor of Blood Glucose) []  - 0 Ankle / Brachial Index (ABI) - do not check if billed separately X- 1 5 Vital Signs Has the patient been seen at the hospital within the last three years: Yes Total Score: 100 Level Of Care: New/Established - Level 3 Electronic Signature(s) Signed: 04/29/2022 4:15:12 PM By: Levora Dredge Entered By: Levora Dredge on 04/29/2022 09:55:19 [name] ([id number]) -------------------------------------------------------------------------------- Encounter Discharge Information Details Patient Name: [name] Date of Service: 04/29/2022 9:15 AM Medical Record Number: [id number] Patient Account Number: [id number] Date of Birth/Sex: 25-Jan-1932 (88 y.o. F) Treating RN: Levora Dredge Primary Care Ghassan Coggeshall: Otilio Miu Other Clinician: Referring Avin Upperman: Otilio Miu Treating Genelda Roark/Extender: Skipper Cliche in Treatment: 8 Encounter Discharge Information Items Discharge Condition:  Stable Ambulatory Status: Wheelchair Discharge Destination: Home Transportation: Private Auto Accompanied By: daughter Schedule Follow-up Appointment: Yes Clinical Summary of Care: Electronic Signature(s) Signed: 04/29/2022 4:15:12 PM By: Levora Dredge Entered By: Levora Dredge on 04/29/2022 09:56:09 [name] ([id number]) -------------------------------------------------------------------------------- Lower Extremity Assessment Details Patient Name: [name] Date of Service: 04/29/2022 9:15 AM Medical Record Number: [id number] Patient Account Number: [id number] Date of Birth/Sex: [birth year] (88 y.o. F) Treating RN: Levora Dredge Primary Care Jeremih Dearmas: Otilio Miu Other Clinician: Referring Alia Parsley: Otilio Miu Treating Merrie Epler/Extender: Jeri Cos Weeks in Treatment: 8 Electronic Signature(s) Signed: 04/29/2022 4:15:12 PM By: Levora Dredge Entered By: Levora Dredge on 04/29/2022 09:38:35 JESSIC, STANDIFER ([id number]) -------------------------------------------------------------------------------- Multi Wound Chart Details Patient Name: [name] Date of Service: 04/29/2022 9:15 AM Medical Record Number: [id number] Patient Account Number: [id number] Date of Birth/Sex: 1932/06/03 (88 y.o. F) Treating RN: Levora Dredge Primary Care Yaphet Smethurst: Otilio Miu Other Clinician: Referring Ezra Marquess: Otilio Miu Treating Aeralyn Barna/Extender: Jeri Cos Weeks in Treatment: 8 Vital Signs Height(in): 55 Pulse(bpm): 39 Weight(lbs): 174 Blood Pressure(mmHg): 161/78 Body Mass Index(BMI): 30.8 Temperature(F): 97.8 Respiratory Rate(breaths/min): 18 Photos: [N/A:N/A] Wound Location: Left Gluteus Right Gluteus N/A Wounding Event: Gradually Appeared Gradually Appeared N/A Primary Etiology: Pressure Ulcer Pressure Ulcer N/A Comorbid History: Arrhythmia, Coronary Artery Arrhythmia, Coronary Artery N/A Disease, Hypertension, Type II Disease, Hypertension,  Type II Diabetes, Osteoarthritis Diabetes, Osteoarthritis Date Acquired: 02/04/2022 04/07/2022 N/A Weeks of Treatment: 8 2 N/A Wound Status: Open Open N/A Wound Recurrence: No No N/A Measurements L x W x D (cm) 0.2x0.2x0.1 1.5x0.5x0.1 N/A Area (cm) : 0.031 0.589 N/A Volume (cm) : 0.003 0.059 N/A % Reduction in Area: 80.30% -15.30% N/A % Reduction in Volume: 81.30% -15.70% N/A Classification: Category/Stage III Category/Stage III N/A Exudate Amount: Medium Medium N/A Exudate Type: Serosanguineous Serosanguineous N/A Exudate Color: red, brown red, brown N/A Granulation Amount: Large (67-100%) Large (67-100%) N/A Granulation Quality: Pink Red, Pink N/A Necrotic Amount:  Small (1-33%) Small (1-33%) N/A Exposed Structures: Fat Layer (Subcutaneous Tissue): Fat Layer (Subcutaneous Tissue): N/A Yes Yes Fascia: No Tendon: No Muscle: No Joint: No Bone: No Epithelialization: Medium (34-66%) Small (1-33%) N/A Treatment Notes Electronic Signature(s) Signed: 04/29/2022 4:15:12 PM By: Levora Dredge Entered By: Levora Dredge on 04/29/2022 09:54:27 [name] ([id number]) -------------------------------------------------------------------------------- [place] Details Patient Name: [name] Date of Service: 04/29/2022 9:15 AM Medical Record Number: [id number] Patient Account Number: [id number] Date of Birth/Sex: 01-21-1932 (88 y.o. F) Treating RN: Levora Dredge Primary Care Stelios Kirby: Otilio Miu Other Clinician: Referring Jazleen Robeck: Otilio Miu Treating Jazyah Butsch/Extender: Skipper Cliche in Treatment: 8 Active Inactive Pressure Nursing Diagnoses: Knowledge deficit related to causes and risk factors for pressure ulcer development Knowledge deficit related to management of pressures ulcers Potential for impaired tissue integrity related to pressure, friction, moisture, and shear Goals: Patient will remain free from development of additional  pressure ulcers Date Initiated: 03/04/2022 Target Resolution Date: 04/15/2022 Goal Status: Active Patient/caregiver will verbalize risk factors for pressure ulcer development Date Initiated: 03/04/2022 Target Resolution Date: 04/15/2022 Goal Status: Active Interventions: Assess: immobility, friction, shearing, incontinence upon admission and as needed Assess offloading mechanisms upon admission and as needed Assess potential for pressure ulcer upon admission and as needed Notes: Electronic Signature(s) Signed: 04/29/2022 4:15:12 PM By: Levora Dredge Entered By: Levora Dredge on 04/29/2022 09:41:27 [name] ([id number]) -------------------------------------------------------------------------------- Pain Assessment Details Patient Name: [name] Date of Service: 04/29/2022 9:15 AM Medical Record Number: [id number] Patient Account Number: [id number] Date of Birth/Sex: [birth year] (88 y.o. F) Treating RN: Levora Dredge Primary Care Natalea Sutliff: Otilio Miu Other Clinician: Referring Yasmyn Bellisario: Otilio Miu Treating Zander Ingham/Extender: Skipper Cliche in Treatment: 8 Active Problems Location of Pain Severity and Description of Pain Patient Has Paino No Site Locations Rate the pain. Current Pain Level: 0 Pain Management and Medication Current Pain Management: Electronic Signature(s) Signed: 04/29/2022 4:15:12 PM By: Levora Dredge Entered By: Levora Dredge on 04/29/2022 09:30:37 [name] ([id number]) -------------------------------------------------------------------------------- Patient/Caregiver Education Details Patient Name: [name] Date of Service: 04/29/2022 9:15 AM Medical Record Number: [id number] Patient Account Number: [id number] Date of Birth/Gender: 1932-02-14 (88 y.o. F) Treating RN: Levora Dredge Primary Care Physician: Otilio Miu Other Clinician: Referring Physician: Otilio Miu Treating Physician/Extender: Skipper Cliche in Treatment: 8 Education Assessment Education Provided To: Patient and Caregiver Education Topics Provided Wound/Skin Impairment: Handouts: Caring for Your Ulcer Methods: Explain/Verbal Responses: State content correctly Electronic Signature(s) Signed: 04/29/2022 4:15:12 PM By: Levora Dredge Entered By: Levora Dredge on 04/29/2022 09:55:37 [name] ([id number]) -------------------------------------------------------------------------------- Wound Assessment Details Patient Name: [name] Date of Service: 04/29/2022 9:15 AM Medical Record Number: [id number] Patient Account Number: [id number] Date of Birth/Sex: [birth year] (88 y.o. F) Treating RN: Levora Dredge Primary Care Hatcher Froning: Otilio Miu Other Clinician: Referring Matalynn Graff: Otilio Miu Treating Camey Edell/Extender: Jeri Cos Weeks in Treatment: 8 Wound Status Wound Number: 1 Primary Pressure Ulcer Etiology: Wound Location: Left Gluteus Wound Open Wounding Event: Gradually Appeared Status: Date Acquired: 02/04/2022 Comorbid Arrhythmia, Coronary Artery Disease, Hypertension, Weeks Of Treatment: 8 History: Type II Diabetes, Osteoarthritis Clustered Wound: No Photos Wound Measurements Length: (cm) 0.2 Width: (cm) 0.2 Depth: (cm) 0.1 Area: (cm) 0.031 Volume: (cm) 0.003 % Reduction in Area: 80.3% % Reduction in Volume: 81.3% Epithelialization: Medium (34-66%) Tunneling: No Undermining: No Wound Description Classification: Category/Stage III Exudate Amount: Medium Exudate Type: Serosanguineous Exudate Color: red, brown Foul Odor After Cleansing: No Slough/Fibrino Yes Wound Bed Granulation Amount: Large (67-100%) Exposed Structure Granulation Quality:  Pink Fascia Exposed: No Necrotic Amount: Small (1-33%) Fat Layer (Subcutaneous Tissue) Exposed: Yes Necrotic Quality: Adherent Slough Tendon Exposed: No Muscle Exposed: No Joint Exposed: No Bone Exposed: No Treatment  Notes Wound #1 (Gluteus) Wound Laterality: Left Cleanser Byram Ancillary Kit - 15 Day Supply Discharge Instruction: Use supplies as instructed; Kit contains: (15) Saline Bullets; (15) 3x3 Gauze; 15 pr Gloves Soap and Water Discharge Instruction: Gently cleanse wound with antibacterial soap, rinse and pat dry prior to dressing wounds ANISAH, KUCK ([id number]) Wound Cleanser Discharge Instruction: Wash your hands with soap and water. Remove old dressing, discard into plastic bag and place into trash. Cleanse the wound with Wound Cleanser prior to applying a clean dressing using gauze sponges, not tissues or cotton balls. Do not scrub or use excessive force. Pat dry using gauze sponges, not tissue or cotton balls. Peri-Wound Care Topical Primary Dressing Xeroform 4x4-HBD (in/in) Discharge Instruction: Apply Xeroform 4x4-HBD (in/in) as directed Secondary Dressing (SILICONE BORDER) Zetuvit Plus SILICONE BORDER Dressing 4x4 (in/in) Discharge Instruction: Please do not put silicone bordered dressings under wraps. Use non-bordered dressing only. Secured With Compression Wrap Compression Stockings Add-Ons Electronic Signature(s) Signed: 04/29/2022 4:15:12 PM By: Levora Dredge Entered By: Levora Dredge on 04/29/2022 09:37:56 VENISA, FRAMPTON ([id number]) -------------------------------------------------------------------------------- Wound Assessment Details Patient Name: [name] Date of Service: 04/29/2022 9:15 AM Medical Record Number: [id number] Patient Account Number: [id number] Date of Birth/Sex: 01-03-1932 (88 y.o. F) Treating RN: Levora Dredge Primary Care Turner Kunzman: Otilio Miu Other Clinician: Referring Willella Harding: Otilio Miu Treating Avi Kerschner/Extender: Jeri Cos Weeks in Treatment: 8 Wound Status Wound Number: 2 Primary Pressure Ulcer Etiology: Wound Location: Right Gluteus Wound Open Wounding Event: Gradually Appeared Status: Date Acquired:  04/07/2022 Comorbid Arrhythmia, Coronary Artery Disease, Hypertension, Weeks Of Treatment: 2 History: Type II Diabetes, Osteoarthritis Clustered Wound: No Photos Wound Measurements Length: (cm) 1.5 Width: (cm) 0.5 Depth: (cm) 0.1 Area: (cm) 0.589 Volume: (cm) 0.059 % Reduction in Area: -15.3% % Reduction in Volume: -15.7% Epithelialization: Small (1-33%) Tunneling: No Undermining: No Wound Description Classification: Category/Stage III Exudate Amount: Medium Exudate Type: Serosanguineous Exudate Color: red, brown Foul Odor After Cleansing: No Slough/Fibrino Yes Wound Bed Granulation Amount: Large (67-100%) Exposed Structure Granulation Quality: Red, Pink Fat Layer (Subcutaneous Tissue) Exposed: Yes Necrotic Amount: Small (1-33%) Necrotic Quality: Adherent Slough Treatment Notes Wound #2 (Gluteus) Wound Laterality: Right Cleanser Byram Ancillary Kit - 15 Day Supply Discharge Instruction: Use supplies as instructed; Kit contains: (15) Saline Bullets; (15) 3x3 Gauze; 15 pr Gloves Soap and Water Discharge Instruction: Gently cleanse wound with antibacterial soap, rinse and pat dry prior to dressing wounds Wound Cleanser Discharge Instruction: Wash your hands with soap and water. Remove old dressing, discard into plastic bag and place into trash. Cleanse the wound with Wound Cleanser prior to applying a clean dressing using gauze sponges, not tissues or cotton balls. Do not ANNE-MARIE, GENSON. ([id number]) scrub or use excessive force. Pat dry using gauze sponges, not tissue or cotton balls. Peri-Wound Care Topical Primary Dressing Xeroform 4x4-HBD (in/in) Discharge Instruction: Apply Xeroform 4x4-HBD (in/in) as directed Secondary Dressing (SILICONE BORDER) Zetuvit Plus SILICONE BORDER Dressing 4x4 (in/in) Discharge Instruction: Please do not put silicone bordered dressings under wraps. Use non-bordered dressing only. Secured With Compression Wrap Compression  Stockings Add-Ons Electronic Signature(s) Signed: 04/29/2022 4:15:12 PM By: Levora Dredge Entered By: Levora Dredge on 04/29/2022 09:38:26 RONEISHA, STERN ([id number]) -------------------------------------------------------------------------------- Vitals Details Patient Name: [name] Date of Service: 04/29/2022 9:15 AM Medical Record Number: [id number] Patient Account Number: [id number]  Date of Birth/Sex: September 08, 1932 (88 y.o. F) Treating RN: Levora Dredge Primary Care Tarell Schollmeyer: Otilio Miu Other Clinician: Referring Lakyla Biswas: Otilio Miu Treating Everli Rother/Extender: Jeri Cos Weeks in Treatment: 8 Vital Signs Time Taken: 09:30 Temperature (F): 97.8 Height (in): 63 Pulse (bpm): 83 Weight (lbs): 174 Respiratory Rate (breaths/min): 18 Body Mass Index (BMI): 30.8 Blood Pressure (mmHg): 161/78 Reference Range: 80 - 120 mg / dl Electronic Signature(s) Signed: 04/29/2022 4:15:12 PM By: Levora Dredge Entered By: Levora Dredge on 04/29/2022 09:30:19

## 2022-04-29 NOTE — Progress Notes (Signed)
[name], SINGLE ([id number]) Visit Report for 04/29/2022 Chief Complaint Document Details Patient Name: [name], [name] Date of Service: 04/29/2022 9:15 AM Medical Record Number: [id number] Patient Account Number: [id number] Date of Birth/Sex: Mar 06, 1932 (88 y.o. F) Treating RN: Levora Dredge Primary Care Provider: Otilio Miu Other Clinician: Referring Provider: Otilio Miu Treating Provider/Extender: Jeri Cos Weeks in Treatment: 8 Information Obtained from: Patient Chief Complaint Bilateral buttock ulcers Electronic Signature(s) Signed: 04/29/2022 9:40:38 AM By: Worthy Keeler PA-C Entered By: Worthy Keeler on 04/29/2022 09:40:38 [name] ([id number]) -------------------------------------------------------------------------------- HPI Details Patient Name: [name] Date of Service: 04/29/2022 9:15 AM Medical Record Number: [id number] Patient Account Number: [id number] Date of Birth/Sex: 1932/04/20 (88 y.o. F) Treating RN: Levora Dredge Primary Care Provider: Otilio Miu Other Clinician: Referring Provider: Otilio Miu Treating Provider/Extender: Skipper Cliche in Treatment: 8 History of Present Illness HPI Description: 03-04-2022 upon evaluation today patient appears to be doing poorly in regard to her gluteal region. Its mainly in the left gluteal region where there is an open ulceration at this point. This does appear to be a stage III pressure ulcer. Fortunately there does not appear to be any evidence of active infection at this site locally nor systemically which is also news. The patient has been counseled by her daughter who is present with her today her son was also on the phone we had a good discussion at this point. Nonetheless they have been trying to get her to not sit as long as they knew that was the major issue the patient tells me however she has a hard time getting up because of her knee. She has had of extensive work-up and treatment  for the knee and fortunately has been determined this is just arthritis and there is really not much more that they can do. Nonetheless I think she is going to have to get up and move around or at least get up and stand and allow for blood flow into the gluteal area or out she is can end up with more significant wounds than what we have and see right now. Patient does have a history of diabetes mellitus type 2, hypertension, and coronary artery disease. This wound has been present for at this point roughly 1 month beginning on or around February 04, 2022. 03-21-2022 upon evaluation today patient's wound actually showing signs of excellent improvement I am very pleased with where we stand today there does not appear to be any signs of infection which is great news and overall I think we are headed in the right direction. This is not terribly smaller but it is also looking better than what it was as far as the health of the surface of the wound is concerned. 04-15-2022 upon evaluation today patient appears to be doing well in regard to 1 side of her gluteal area not as well and the other that was healed last time. The right side is reopened which was previously closed. The left side is not as bad and in fact is very close to complete closure. Fortunately I do not see any evidence of active infection locally or systemically at this time which is great news. Overall I think that we are headed in the right direction but still regular probably need to put something on both sides to protect at this point and try to get these areas healed. 04-29-2022 upon evaluation today patient appears to be doing well with regard to her wounds both are shown signs of improvement  and very pleased in that regard. I do not see evidence of infection locally or systemically currently. Electronic Signature(s) Signed: 04/29/2022 9:50:51 AM By: Worthy Keeler PA-C Entered By: Worthy Keeler on 04/29/2022 09:50:50 [name]  ([id number]) -------------------------------------------------------------------------------- Physical Exam Details Patient Name: [name] Date of Service: 04/29/2022 9:15 AM Medical Record Number: [id number] Patient Account Number: [id number] Date of Birth/Sex: February 28, 1932 (88 y.o. F) Treating RN: Levora Dredge Primary Care Provider: Otilio Miu Other Clinician: Referring Provider: Otilio Miu Treating Provider/Extender: Jeri Cos Weeks in Treatment: 8 Constitutional Well-nourished and well-hydrated in no acute distress. Respiratory normal breathing without difficulty. Psychiatric this patient is able to make decisions and demonstrates good insight into disease process. Alert and Oriented x 3. pleasant and cooperative. Notes Patient appears to be doing well with regard to her wounds both are shown signs of improvement and very pleased in that regard. I do not see evidence of infection locally or systemically currently.Patient's wound showed evidence of good granulation epithelization at this point. Fortunately I do not see signs of infection which is great and overall I do believe the wounds are doing better this Xeroform I think is helping. Electronic Signature(s) Signed: 04/29/2022 9:51:08 AM By: Worthy Keeler PA-C Entered By: Worthy Keeler on 04/29/2022 09:51:08 [name] ([id number]) -------------------------------------------------------------------------------- Physician Orders Details Patient Name: [name] Date of Service: 04/29/2022 9:15 AM Medical Record Number: [id number] Patient Account Number: [id number] Date of Birth/Sex: Nov 25, 1931 (88 y.o. F) Treating RN: Levora Dredge Primary Care Provider: Otilio Miu Other Clinician: Referring Provider: Otilio Miu Treating Provider/Extender: Skipper Cliche in Treatment: 8 Verbal / Phone Orders: No Diagnosis Coding ICD-10 Coding Code Description [phone] Pressure ulcer of left buttock,  stage 3 L89.313 Pressure ulcer of right buttock, stage 3 E11.622 Type 2 diabetes mellitus with other skin ulcer I10 Essential (primary) hypertension I25.10 Atherosclerotic heart disease of native coronary artery without angina pectoris Follow-up Appointments o Return Appointment in 2 weeks. Bathing/ Shower/ Hygiene o Wash wounds with antibacterial soap and water. - change dressing after shower o No tub bath. Anesthetic (Use 'Patient Medications' Section for Anesthetic Order Entry) o Lidocaine applied to wound bed Edema Control - Lymphedema / Segmental Compressive Device / Other o DO YOUR BEST to sleep in the bed at night. DO NOT sleep in your recliner. Long hours of sitting in a recliner leads to swelling of the legs and/or potential wounds on your backside. - Try to sleep in the bed Off-Loading o Gel wheelchair cushion o Turn and reposition every 2 hours Additional Orders / Instructions o Follow Nutritious Diet and Increase Protein Intake - monitor blood sugar also Wound Treatment Wound #1 - Gluteus Wound Laterality: Left Cleanser: Byram Ancillary Kit - 15 Day Supply (Generic) 1 x Per Day/15 Days Discharge Instructions: Use supplies as instructed; Kit contains: (15) Saline Bullets; (15) 3x3 Gauze; 15 pr Gloves Cleanser: Soap and Water 1 x Per Day/15 Days Discharge Instructions: Gently cleanse wound with antibacterial soap, rinse and pat dry prior to dressing wounds Cleanser: Wound Cleanser 1 x Per Day/15 Days Discharge Instructions: Wash your hands with soap and water. Remove old dressing, discard into plastic bag and place into trash. Cleanse the wound with Wound Cleanser prior to applying a clean dressing using gauze sponges, not tissues or cotton balls. Do not scrub or use excessive force. Pat dry using gauze sponges, not tissue or cotton balls. Primary Dressing: Xeroform 4x4-HBD (in/in) (Generic) 1 x Per Day/15 Days Discharge Instructions: Apply Xeroform  4x4-HBD  (in/in) as directed Secondary Dressing: (SILICONE BORDER) Zetuvit Plus SILICONE BORDER Dressing 4x4 (in/in) (Generic) 1 x Per Day/15 Days Discharge Instructions: Please do not put silicone bordered dressings under wraps. Use non-bordered dressing only. Wound #2 - Gluteus Wound Laterality: Right Cleanser: Byram Ancillary Kit - 15 Day Supply (Generic) 1 x Per Day/15 Days Discharge Instructions: Use supplies as instructed; Kit contains: (15) Saline Bullets; (15) 3x3 Gauze; 15 pr Gloves Cleanser: Soap and Water 1 x Per Day/15 Days SHOSHANAH, DAPPER ([id number]) Discharge Instructions: Gently cleanse wound with antibacterial soap, rinse and pat dry prior to dressing wounds Cleanser: Wound Cleanser 1 x Per Day/15 Days Discharge Instructions: Wash your hands with soap and water. Remove old dressing, discard into plastic bag and place into trash. Cleanse the wound with Wound Cleanser prior to applying a clean dressing using gauze sponges, not tissues or cotton balls. Do not scrub or use excessive force. Pat dry using gauze sponges, not tissue or cotton balls. Primary Dressing: Xeroform 4x4-HBD (in/in) (Generic) 1 x Per Day/15 Days Discharge Instructions: Apply Xeroform 4x4-HBD (in/in) as directed Secondary Dressing: (SILICONE BORDER) Zetuvit Plus SILICONE BORDER Dressing 4x4 (in/in) (Generic) 1 x Per Day/15 Days Discharge Instructions: Please do not put silicone bordered dressings under wraps. Use non-bordered dressing only. Electronic Signature(s) Unsigned Entered By: Levora Dredge on 04/29/2022 09:54:46 Signature(s): Date(s): MACKIE, GOON ([id number]) -------------------------------------------------------------------------------- Problem List Details Patient Name: ALVINA, STROTHER Date of Service: 04/29/2022 9:15 AM Medical Record Number: [id number] Patient Account Number: [id number] Date of Birth/Sex: [birth year] (88 y.o. F) Treating RN: Levora Dredge Primary Care Provider: Otilio Miu  Other Clinician: Referring Provider: Otilio Miu Treating Provider/Extender: Jeri Cos Weeks in Treatment: 8 Active Problems ICD-10 Encounter Code Description Active Date MDM Diagnosis [phone] Pressure ulcer of left buttock, stage 3 03/04/2022 No Yes L89.313 Pressure ulcer of right buttock, stage 3 04/15/2022 No Yes E11.622 Type 2 diabetes mellitus with other skin ulcer 03/04/2022 No Yes I10 Essential (primary) hypertension 03/04/2022 No Yes I25.10 Atherosclerotic heart disease of native coronary artery without angina 03/04/2022 No Yes pectoris Inactive Problems Resolved Problems Electronic Signature(s) Signed: 04/29/2022 9:40:35 AM By: Worthy Keeler PA-C Entered By: Worthy Keeler on 04/29/2022 09:40:34 [name] ([id number]) -------------------------------------------------------------------------------- Progress Note Details Patient Name: [name] Date of Service: 04/29/2022 9:15 AM Medical Record Number: [id number] Patient Account Number: [id number] Date of Birth/Sex: 03-May-1932 (88 y.o. F) Treating RN: Levora Dredge Primary Care Provider: Otilio Miu Other Clinician: Referring Provider: Otilio Miu Treating Provider/Extender: Skipper Cliche in Treatment: 8 Subjective Chief Complaint Information obtained from Patient Bilateral buttock ulcers History of Present Illness (HPI) 03-04-2022 upon evaluation today patient appears to be doing poorly in regard to her gluteal region. Its mainly in the left gluteal region where there is an open ulceration at this point. This does appear to be a stage III pressure ulcer. Fortunately there does not appear to be any evidence of active infection at this site locally nor systemically which is also news. The patient has been counseled by her daughter who is present with her today her son was also on the phone we had a good discussion at this point. Nonetheless they have been trying to get her to not sit as long as they  knew that was the major issue the patient tells me however she has a hard time getting up because of her knee. She has had of extensive work-up and treatment for the knee and fortunately has been determined this is just  arthritis and there is really not much more that they can do. Nonetheless I think she is going to have to get up and move around or at least get up and stand and allow for blood flow into the gluteal area or out she is can end up with more significant wounds than what we have and see right now. Patient does have a history of diabetes mellitus type 2, hypertension, and coronary artery disease. This wound has been present for at this point roughly 1 month beginning on or around February 04, 2022. 03-21-2022 upon evaluation today patient's wound actually showing signs of excellent improvement I am very pleased with where we stand today there does not appear to be any signs of infection which is great news and overall I think we are headed in the right direction. This is not terribly smaller but it is also looking better than what it was as far as the health of the surface of the wound is concerned. 04-15-2022 upon evaluation today patient appears to be doing well in regard to 1 side of her gluteal area not as well and the other that was healed last time. The right side is reopened which was previously closed. The left side is not as bad and in fact is very close to complete closure. Fortunately I do not see any evidence of active infection locally or systemically at this time which is great news. Overall I think that we are headed in the right direction but still regular probably need to put something on both sides to protect at this point and try to get these areas healed. 04-29-2022 upon evaluation today patient appears to be doing well with regard to her wounds both are shown signs of improvement and very pleased in that regard. I do not see evidence of infection locally or systemically  currently. Objective Constitutional Well-nourished and well-hydrated in no acute distress. Vitals Time Taken: 9:30 AM, Height: 63 in, Weight: 174 lbs, BMI: 30.8, Temperature: 97.8 F, Pulse: 83 bpm, Respiratory Rate: 18 breaths/min, Blood Pressure: 161/78 mmHg. Respiratory normal breathing without difficulty. Psychiatric this patient is able to make decisions and demonstrates good insight into disease process. Alert and Oriented x 3. pleasant and cooperative. General Notes: Patient appears to be doing well with regard to her wounds both are shown signs of improvement and very pleased in that regard. I do not see evidence of infection locally or systemically currently.Patient's wound showed evidence of good granulation epithelization at this point. Fortunately I do not see signs of infection which is great and overall I do believe the wounds are doing better this Xeroform I think is helping. Integumentary (Hair, Skin) Wound #1 status is Open. Original cause of wound was Gradually Appeared. The date acquired was: 02/04/2022. The wound has been in treatment 8 weeks. The wound is located on the Left Gluteus. The wound measures 0.2cm length x 0.2cm width x 0.1cm depth; 0.031cm^2 area and 0.003cm^3 volume. There is Fat Layer (Subcutaneous Tissue) exposed. There is no tunneling or undermining noted. There is a medium amount of serosanguineous drainage noted. There is large (67-100%) pink granulation within the wound bed. There is a small (1-33%) amount of necrotic JASHA, HODZIC. ([id number]) tissue within the wound bed including Adherent Slough. Wound #2 status is Open. Original cause of wound was Gradually Appeared. The date acquired was: 04/07/2022. The wound has been in treatment 2 weeks. The wound is located on the Right Gluteus. The wound measures 1.5cm length x 0.5cm width  x 0.1cm depth; 0.589cm^2 area and 0.059cm^3 volume. There is Fat Layer (Subcutaneous Tissue) exposed. There is no  tunneling or undermining noted. There is a medium amount of serosanguineous drainage noted. There is large (67-100%) red, pink granulation within the wound bed. There is a small (1-33%) amount of necrotic tissue within the wound bed including Adherent Slough. Assessment Active Problems ICD-10 Pressure ulcer of left buttock, stage 3 Pressure ulcer of right buttock, stage 3 Type 2 diabetes mellitus with other skin ulcer Essential (primary) hypertension Atherosclerotic heart disease of native coronary artery without angina pectoris Plan 1. I would recommend currently that we go ahead and continue with the wound care measures as before using Xeroform gauze to the wound beds I think that is doing well. 2. I am also can recommend that we have the patient continue to use the bordered foam dressing to cover which I think is doing well. We will see patient back for reevaluation in 1 week here in the clinic. If anything worsens or changes patient will contact our office for additional recommendations. Electronic Signature(s) Signed: 04/29/2022 9:51:33 AM By: Worthy Keeler PA-C Entered By: Worthy Keeler on 04/29/2022 09:51:33 [name] ([id number]) -------------------------------------------------------------------------------- SuperBill Details Patient Name: [name] Date of Service: 04/29/2022 Medical Record Number: [id number] Patient Account Number: [id number] Date of Birth/Sex: [birth year] (88 y.o. F) Treating RN: Levora Dredge Primary Care Provider: Otilio Miu Other Clinician: Referring Provider: Otilio Miu Treating Provider/Extender: Jeri Cos Weeks in Treatment: 8 Diagnosis Coding ICD-10 Codes Code Description [phone] Pressure ulcer of left buttock, stage 3 L89.313 Pressure ulcer of right buttock, stage 3 E11.622 Type 2 diabetes mellitus with other skin ulcer I10 Essential (primary) hypertension I25.10 Atherosclerotic heart disease of native coronary artery  without angina pectoris Facility Procedures CPT4 Code: [id number] Description: 99213 - WOUND CARE VISIT-LEV 3 EST PT Modifier: Quantity: 1 Physician Procedures CPT4 Code: [id number] Description: 99213 - WC PHYS LEVEL 3 - EST PT Modifier: Quantity: 1 CPT4 Code: Description: ICD-10 Diagnosis Description L89.323 Pressure ulcer of left buttock, stage 3 L89.313 Pressure ulcer of right buttock, stage 3 E11.622 Type 2 diabetes mellitus with other skin ulcer I10 Essential (primary) hypertension Modifier: Quantity: Electronic Signature(s) Unsigned Previous Signature: 04/29/2022 9:52:36 AM Version By: Worthy Keeler PA-C Entered By: Levora Dredge on 04/29/2022 09:55:28 Signature(s): Date(s):

## 2022-04-30 DIAGNOSIS — M179 Osteoarthritis of knee, unspecified: Secondary | ICD-10-CM | POA: Diagnosis not present

## 2022-05-05 ENCOUNTER — Other Ambulatory Visit: Payer: Self-pay

## 2022-05-05 ENCOUNTER — Telehealth: Payer: Self-pay | Admitting: Family Medicine

## 2022-05-05 DIAGNOSIS — J301 Allergic rhinitis due to pollen: Secondary | ICD-10-CM

## 2022-05-05 MED ORDER — MOMETASONE FUROATE 50 MCG/ACT NA SUSP: NASAL | 0 refills | Status: DC

## 2022-05-05 NOTE — Telephone Encounter (Signed)
Copied from CRM [id number]. Topic: General - Other >> May 05, 2022  9:04 AM Pincus Sanes wrote: This pt wants Delice Bison to fu with her b/c she has a cold and wants medication. Offer NT line, offered appt. Pt will not accept any other option but Delice Bison to call her even if is is later in the day. FU [id number]

## 2022-05-06 ENCOUNTER — Ambulatory Visit (INDEPENDENT_AMBULATORY_CARE_PROVIDER_SITE_OTHER): Payer: Medicare Other | Admitting: Family Medicine

## 2022-05-06 ENCOUNTER — Ambulatory Visit
Admission: RE | Admit: 2022-05-06 | Discharge: 2022-05-06 | Disposition: A | Discharge status: Home / Self Care | Payer: Medicare Other | Source: Ambulatory Visit | Attending: Family Medicine | Admitting: Family Medicine

## 2022-05-06 ENCOUNTER — Encounter: Payer: Self-pay | Admitting: Family Medicine

## 2022-05-06 ENCOUNTER — Ambulatory Visit
Admission: RE | Admit: 2022-05-06 | Discharge: 2022-05-06 | Disposition: A | Discharge status: Home / Self Care | Payer: Medicare Other | Attending: Family Medicine | Admitting: Family Medicine

## 2022-05-06 VITALS — BP 124/70 | HR 94 | Temp 98.1°F | Ht 63.0 in | Wt 168.0 lb

## 2022-05-06 DIAGNOSIS — I509 Heart failure, unspecified: Secondary | ICD-10-CM | POA: Diagnosis not present

## 2022-05-06 DIAGNOSIS — J3089 Other allergic rhinitis: Secondary | ICD-10-CM

## 2022-05-06 DIAGNOSIS — R059 Cough, unspecified: Secondary | ICD-10-CM | POA: Diagnosis not present

## 2022-05-06 DIAGNOSIS — R058 Other specified cough: Secondary | ICD-10-CM

## 2022-05-06 DIAGNOSIS — K219 Gastro-esophageal reflux disease without esophagitis: Secondary | ICD-10-CM

## 2022-05-06 MED ORDER — OMEPRAZOLE 20 MG PO CPDR: 20.0000 mg | DELAYED_RELEASE_CAPSULE | Freq: Every day | ORAL | 1 refills | Status: DC

## 2022-05-06 MED ORDER — GUAIFENESIN-DM 100-10 MG/5ML PO SYRP: 5.0000 mL | ORAL_SOLUTION | ORAL | 0 refills | Status: DC | PRN

## 2022-05-06 MED ORDER — FUROSEMIDE 20 MG PO TABS: 20.0000 mg | ORAL_TABLET | Freq: Every day | ORAL | 0 refills | Status: DC

## 2022-05-06 MED ORDER — MONTELUKAST SODIUM 10 MG PO TABS: 10.0000 mg | ORAL_TABLET | Freq: Every day | ORAL | 1 refills | Status: DC

## 2022-05-06 NOTE — Progress Notes (Signed)
Date:  05/06/2022   Name:  [name]   DOB:  06-11-1932   MRN:  [id number]   Chief Complaint: Cough (Started 2-3 days ago. Congestion. No fever, or sore throat.), Gastroesophageal Reflux, and Allergies  Cough This is a new problem. The current episode started in the past 7 days. The problem has been unchanged. The problem occurs every few minutes. The cough is Non-productive. Pertinent negatives include no chest pain, fever, heartburn, shortness of breath or wheezing. The symptoms are aggravated by lying down. Treatments tried: singulair. The treatment provided mild relief.  Gastroesophageal Reflux She complains of coughing. She reports no chest pain, no dysphagia, no heartburn, no nausea or no wheezing. This is a chronic problem. The problem occurs rarely. The problem has been gradually improving. Nothing aggravates the symptoms. She has tried a PPI for the symptoms. The treatment provided mild relief.  Congestive Heart Failure Pertinent negatives include no chest pain, palpitations or shortness of breath.    Lab Results  Component Value Date   NA 137 01/17/2022   K 4.4 01/17/2022   CO2 21 01/17/2022   GLUCOSE 119 (H) 01/17/2022   BUN 17 01/17/2022   CREATININE 0.62 01/17/2022   CALCIUM 9.1 01/17/2022   EGFR 86 01/17/2022   GFRNONAA >60 04/13/2021   Lab Results  Component Value Date   CHOL 170 01/17/2022   HDL 62 01/17/2022   LDLCALC 97 01/17/2022   TRIG 57 01/17/2022   CHOLHDL 2.2 07/23/2019   Lab Results  Component Value Date   TSH 1.824 04/13/2021   Lab Results  Component Value Date   HGBA1C 6.5 (H) 01/17/2022   Lab Results  Component Value Date   WBC 5.4 04/13/2021   HGB 10.6 (L) 04/13/2021   HCT 33.3 (L) 04/13/2021   MCV 84.3 04/13/2021   PLT 215 04/13/2021   Lab Results  Component Value Date   ALT 5 01/17/2022   AST 11 01/17/2022   ALKPHOS 85 01/17/2022   BILITOT 0.3 01/17/2022   No results found for: "25OHVITD2", "25OHVITD3", "VD25OH"    Review of Systems  Constitutional:  Negative for fever.  Respiratory:  Positive for cough. Negative for shortness of breath and wheezing.   Cardiovascular:  Negative for chest pain, palpitations and leg swelling.  Gastrointestinal:  Negative for dysphagia, heartburn and nausea.    Patient Active Problem List   Diagnosis Date Noted   Valvular heart disease 07/23/2021   Chronic pain of right knee 06/17/2021   Chronic pain syndrome 06/17/2021   Near syncope 04/12/2021   Pain due to total right knee replacement ([place]) 03/02/2021   Aortic valve regurgitation 10/23/2020   Bradycardia 09/04/2019   PVC's (premature ventricular contractions) 09/04/2019   Leg edema 09/04/2019   Sinus bradycardia 03/16/2018   PSVT (paroxysmal supraventricular tachycardia) ([place]) 03/16/2018   NSVT (nonsustained ventricular tachycardia) ([place]) 03/16/2018   Malignant HTN with heart disease, w/o CHF, w/o chronic kidney disease 02/05/2018   Hyperlipidemia 09/24/2015   Diabetes mellitus with no complication ([place]) 96/28/3662   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   Arthritis of knee, degenerative 03/16/2015   Sinus infection 03/16/2015   Diabetes ([place])    Essential hypertension    GERD (gastroesophageal reflux disease)    History of bilateral knee replacement     Allergies  Allergen Reactions   Nsaids    Lisinopril     Cough    Past Surgical History:  Procedure Laterality Date   BACK SURGERY  CATARACT EXTRACTION Bilateral 03/2016   CERVICAL [place] SURGERY  12/28/2010   "bulging disc; went in on the left side of my neck" (08/19/2013)   JOINT REPLACEMENT     TOTAL KNEE ARTHROPLASTY Left 03/28/2011   Dr Noemi Chapel   TOTAL KNEE ARTHROPLASTY Right 08/19/2013   TOTAL KNEE ARTHROPLASTY Right 08/19/2013   Procedure: TOTAL KNEE ARTHROPLASTY;  Surgeon: Lorn Junes, MD;  Location: [facility];  Service: Orthopedics;  Laterality: Right;    Social History   Tobacco Use   Smoking status: Never    Smokeless tobacco: Never   Tobacco comments:    Smoking cessation materials not required  Vaping Use   Vaping Use: Never used  Substance Use Topics   Alcohol use: Not Currently   Drug use: Never     Medication list has been reviewed and updated.  Current Meds  Medication Sig   cyanocobalamin 100 MCG tablet Take 1 tablet by mouth daily.    diclofenac Sodium (VOLTAREN) 1 % GEL Apply 4 g topically 4 (four) times daily as needed.   docusate sodium (COLACE) 50 MG capsule Take 1 capsule (50 mg total) by mouth 2 (two) times daily.   glipiZIDE (GLUCOTROL XL) 2.5 MG 24 hr tablet Take 1 tablet (2.5 mg total) by mouth daily with breakfast.   glucose blood (ONE TOUCH ULTRA TEST) test strip USE 1 STRIP TO CHECK GLUCOSE ONCE DAILY   HYDROcodone-acetaminophen (NORCO/VICODIN) 5-325 MG tablet Take 1 tablet by mouth every 6 (six) hours as needed for severe pain.   loratadine (CLARITIN) 10 MG tablet Take 1 tablet (10 mg total) by mouth daily.   metFORMIN (GLUCOPHAGE) 500 MG tablet TAKE 1 TABLET BY MOUTH TWICE DAILY WITH MEALS   mometasone (NASONEX) 50 MCG/ACT nasal spray Use 2 spray(s) in each nostril once daily   montelukast (SINGULAIR) 10 MG tablet Take 1 tablet (10 mg total) by mouth at bedtime.   olmesartan (BENICAR) 20 MG tablet Take 1 tablet (20 mg total) by mouth at bedtime.   omeprazole (PRILOSEC) 20 MG capsule Take 1 capsule (20 mg total) by mouth daily.       05/06/2022    2:00 PM 09/14/2021    1:24 PM 05/20/2021    4:15 PM 04/23/2021    4:14 PM  GAD 7 : Generalized Anxiety Score  Nervous, Anxious, on Edge  0 0 0  Control/stop worrying 0 0 0 0  Worry too much - different things 0 0 0 0  Trouble relaxing 0 0 0 0  Restless 0 0 0 0  Easily annoyed or irritable 0 0 0 0  Afraid - awful might happen 0 0 0 0  Total GAD 7 Score  0 0 0  Anxiety Difficulty Not difficult at all          05/06/2022    2:00 PM  Depression screen PHQ 2/9  Decreased Interest 0  Down, Depressed, Hopeless 0   PHQ - 2 Score 0  Altered sleeping 0  Tired, decreased energy 0  Change in appetite 0  Feeling bad or failure about yourself  0  Trouble concentrating 0  Moving slowly or fidgety/restless 0  Suicidal thoughts 0  PHQ-9 Score 0  Difficult doing work/chores Not difficult at all    BP Readings from Last 3 Encounters:  05/06/22 124/70  04/04/22 (!) 161/75  02/12/22 121/78    Physical Exam Vitals and nursing note reviewed.  Constitutional:      Appearance: She is well-developed.  HENT:  Head: Normocephalic.     Right Ear: Tympanic membrane and external ear normal.     Left Ear: Tympanic membrane and external ear normal.     Nose: No congestion.  Eyes:     General: Lids are everted, no foreign bodies appreciated. No scleral icterus.       Left eye: No foreign body or hordeolum.     Conjunctiva/sclera: Conjunctivae normal.     Right eye: Right conjunctiva is not injected.     Left eye: Left conjunctiva is not injected.     Pupils: Pupils are equal, round, and reactive to light.  Neck:     Thyroid: No thyromegaly.     Vascular: No JVD.     Trachea: No tracheal deviation.  Cardiovascular:     Rate and Rhythm: Normal rate and regular rhythm.     Chest Wall: PMI is not displaced.     Heart sounds: S1 normal and S2 normal. No murmur heard.    No systolic murmur is present.     No diastolic murmur is present.     No friction rub. Gallop present. S4 sounds present.  Pulmonary:     Effort: Pulmonary effort is normal. No respiratory distress.     Breath sounds: Examination of the right-lower field reveals rales. Examination of the left-lower field reveals rales. Rales present. No wheezing or rhonchi.     Comments: Fine rales Abdominal:     General: Bowel sounds are normal.     Palpations: Abdomen is soft. There is no mass.     Tenderness: There is no abdominal tenderness. There is no guarding or rebound.  Musculoskeletal:        General: No tenderness. Normal range of motion.      Cervical back: Normal range of motion and neck supple.  Lymphadenopathy:     Cervical: No cervical adenopathy.  Skin:    General: Skin is warm.     Findings: No rash.  Neurological:     Mental Status: She is alert and oriented to person, place, and time.     Cranial Nerves: No cranial nerve deficit.     Deep Tendon Reflexes: Reflexes normal.  Psychiatric:        Mood and Affect: Mood is not anxious or depressed.     Wt Readings from Last 3 Encounters:  05/06/22 160 lb (72.6 kg)  04/04/22 160 lb (72.6 kg)  02/12/22 174 lb 2.6 oz (79 kg)    BP 124/70   Pulse 94   Temp 98.1 F (36.7 C) (Oral)   Ht _0  (1.6 m)   Wt 160 lb (72.6 kg)   SpO2 95%   BMI 28.34 kg/m   Assessment and Plan:  1. Non-seasonal allergic rhinitis, unspecified trigger Patient has run out of her Singulair and has not taken for several weeks.  We will resume Singulair 10 mg once a day as well as use Robitussin-DM. - montelukast (SINGULAIR) 10 MG tablet; Take 1 tablet (10 mg total) by mouth at bedtime.  Dispense: 90 tablet; Refill: 1 - guaiFENesin-dextromethorphan (ROBITUSSIN DM) 100-10 MG/5ML syrup; Take 5 mLs by mouth every 4 (four) hours as needed for cough.  Dispense: 118 mL; Refill: 0  2. Gastroesophageal reflux disease Chronic.  Controlled.  Stable.  Continue omeprazole 20 mg once a day. - omeprazole (PRILOSEC) 20 MG capsule; Take 1 capsule (20 mg total) by mouth daily.  Dispense: 90 capsule; Refill: 1  3. Acute congestive heart failure, unspecified heart failure type (  [place]) New onset.  Patient's developing nonproductive cough as well as has orthopnea.  On examination she has fine rales bilaterally in the posterior lobes and an S4 gallop.  It is noted that she has had an 8 pound weight gain since her visit last month.  We will get a chest x-ray for evaluation and we will initiate Lasix 20 mg once a day. - DG Chest 2 View; Future - furosemide (LASIX) 20 MG tablet; Take 1 tablet (20 mg total) by  mouth daily.  Dispense: 30 tablet; Refill: 0  4. Recurrent cough New onset.  Persistent.  Relatively stable.  Without dyspnea, productivity, nor wheezing.  If not allergy my suspicion is that there is been some exacerbation of mild congestive failure and we will begin by gently diuresing her over the weekend and she has an upcoming appointment with Dr. Saralyn Pilar for recheck. - DG Chest 2 View; Future

## 2022-05-09 ENCOUNTER — Other Ambulatory Visit
Admission: RE | Admit: 2022-05-09 | Discharge: 2022-05-09 | Disposition: A | Discharge status: Home / Self Care | Payer: Medicare Other | Source: Ambulatory Visit | Attending: Medical | Admitting: Medical

## 2022-05-09 ENCOUNTER — Ambulatory Visit (INDEPENDENT_AMBULATORY_CARE_PROVIDER_SITE_OTHER): Payer: Medicare Other

## 2022-05-09 ENCOUNTER — Ambulatory Visit (INDEPENDENT_AMBULATORY_CARE_PROVIDER_SITE_OTHER): Payer: Medicare Other | Admitting: Medical

## 2022-05-09 ENCOUNTER — Encounter: Payer: Self-pay | Admitting: Medical

## 2022-05-09 VITALS — BP 104/70 | HR 66 | Ht 63.0 in | Wt 168.0 lb

## 2022-05-09 DIAGNOSIS — R9431 Abnormal electrocardiogram [ECG] [EKG]: Secondary | ICD-10-CM

## 2022-05-09 DIAGNOSIS — I471 Supraventricular tachycardia: Secondary | ICD-10-CM | POA: Diagnosis not present

## 2022-05-09 DIAGNOSIS — I493 Ventricular premature depolarization: Secondary | ICD-10-CM

## 2022-05-09 DIAGNOSIS — I38 Endocarditis, valve unspecified: Secondary | ICD-10-CM | POA: Diagnosis not present

## 2022-05-09 DIAGNOSIS — I4729 Other ventricular tachycardia: Secondary | ICD-10-CM

## 2022-05-09 DIAGNOSIS — I10 Essential (primary) hypertension: Secondary | ICD-10-CM

## 2022-05-09 LAB — TSH: TSH: 1.8 u[IU]/mL (ref 0.350–4.500)

## 2022-05-09 LAB — MAGNESIUM: Magnesium: 1.7 mg/dL (ref 1.7–2.4)

## 2022-05-09 NOTE — Progress Notes (Unsigned)
Cardiology Office Note:    Date:  05/09/2022   ID:  [name], DOB 1932-11-03, MRN [id number]  PCP:  Juline Patch, MD  [facility] Cardiologist:  Nelva Bush, MD  [facility] Electrophysiologist:  None   Referring MD: Juline Patch, MD   Chief Complaint: 6 month follow-up  History of Present Illness:    [name] is a 88 y.o. female with a hx of aortic and mitral valve regurgitation, NSVT, pSVT, HTN, DM2, h/o bradcyardia who presents for 6 month follow-up.   Previous 01/2018 echo showed EF 65 to 70%, G1 DD, mild to moderate MR, mild LAE, PASP 35 to 40 mmHg plus central venous/RAP.  Previous cardiac monitoring 01/2018 showed rate 43 to 152 bpm with occasional PACs and PVCs.  Brief episodes of SVT and nonsustained VT noted.  No sustained arrhythmias or prolonged pauses.  Heart monitor 04/2021 NSR with minimum heart rate 47 bpm, maximum 179 bpm and average heart rate 64 bpm.  Bundle branch block and IVCD was present.  106 supraventricular tachycardia runs occurred, the fastest of which lasted 4 beats with maximum rate 179 bpm and the longest 4 minutes 11 seconds with average rate 111 bpm.  Isolated PACs were frequent (8.6%, 111,213), atrial couplets were occasional (1.4%, 9304), and atrial triplets were rare (less than 1%, [id number]).  Isolated PVCs were rare and less than 1%.    Echo 09/2021 showed LVEF 60-65%, no WMA, G1DD, moderate TR, mild AI.   Last seen 09/2021 and BP was borderline. No changes were made  Today, the patient reports she is overall doing well. No SOB, chset pain, Lle, orthopnea, or pnd. She denies heart racing or slipping a beat. No fever or chills. She use a cane and walker at home.    Past Medical History:  Diagnosis Date   GERD (gastroesophageal reflux disease)    Hypertension    Right knee DJD    Type II diabetes mellitus ([place])     Past Surgical History:  Procedure Laterality Date   BACK SURGERY     CATARACT EXTRACTION Bilateral 03/2016    CERVICAL [place] SURGERY  12/28/2010   "bulging disc; went in on the left side of my neck" (08/19/2013)   JOINT REPLACEMENT     TOTAL KNEE ARTHROPLASTY Left 03/28/2011   Dr Noemi Chapel   TOTAL KNEE ARTHROPLASTY Right 08/19/2013   TOTAL KNEE ARTHROPLASTY Right 08/19/2013   Procedure: TOTAL KNEE ARTHROPLASTY;  Surgeon: Lorn Junes, MD;  Location: [facility];  Service: Orthopedics;  Laterality: Right;    Current Medications: No outpatient medications have been marked as taking for the 05/09/22 encounter (Appointment) with Kathlen Mody, Dotty Gonzalo H, PA-C.     Allergies:   Nsaids and Lisinopril   Social History   Socioeconomic History   Marital status: Widowed    Spouse name: Not on file   Number of children: 3   Years of education: Not on file   Highest education level: Not on file  Occupational History   Occupation: Retired  Tobacco Use   Smoking status: Never   Smokeless tobacco: Never   Tobacco comments:    Smoking cessation materials not required  Vaping Use   Vaping Use: Never used  Substance and Sexual Activity   Alcohol use: Not Currently   Drug use: Never   Sexual activity: Not Currently  Other Topics Concern   Not on file  Social History Narrative   Pt lives alone; does not drive   Social  Determinants of Health   Financial Resource Strain: Low Risk  (08/18/2021)   Overall Financial Resource Strain (CARDIA)    Difficulty of Paying Living Expenses: Not hard at all  Food Insecurity: No Food Insecurity (08/18/2021)   Hunger Vital Sign    Worried About Running Out of Food in the Last Year: Never true    Ran Out of Food in the Last Year: Never true  Transportation Needs: No Transportation Needs (08/18/2021)   PRAPARE - Administrator, Civil Service (Medical): No    Lack of Transportation (Non-Medical): No  Physical Activity: Inactive (08/18/2021)   Exercise Vital Sign    Days of Exercise per Week: 0 days    Minutes of Exercise per Session: 0 min  Stress: No Stress Concern  Present (08/18/2021)   [employer] of Occupational Health - Occupational Stress Questionnaire    Feeling of Stress : Not at all  Social Connections: Moderately Isolated (08/18/2021)   Social Connection and Isolation Panel [NHANES]    Frequency of Communication with Friends and Family: More than three times a week    Frequency of Social Gatherings with Friends and Family: More than three times a week    Attends Religious Services: More than 4 times per year    Active Member of [employer] or Organizations: No    Attends Banker Meetings: Never    Marital Status: Widowed     Family History: The patient's family history includes Cerebral aneurysm in her father; Diabetes in her mother.  ROS:   Please see the history of present illness.    All other systems reviewed and are negative.  EKGs/Labs/Other Studies Reviewed:    The following studies were reviewed today:  Echo 10/19/21  1. Left ventricular ejection fraction, by estimation, is 60 to 65%. The  left ventricle has normal function. The left ventricle has no regional  wall motion abnormalities. Left ventricular diastolic parameters are  consistent with Grade I diastolic  dysfunction (impaired relaxation).   2. Right ventricular systolic function is normal. The right ventricular  size is normal. There is mildly elevated pulmonary artery systolic  pressure. The estimated right ventricular systolic pressure is 39.3 mmHg.   3. The mitral valve is normal in structure. Moderate mitral valve  regurgitation. No evidence of mitral stenosis.   4. Tricuspid valve regurgitation is moderate.   5. The aortic valve is calcified. Aortic valve regurgitation is mild.  Borderline aortic valve stenosis.   6. The inferior vena cava is normal in size with greater than 50%  respiratory variability, suggesting right atrial pressure of 3 mmHg.   Heart monitor 04/2021   The patient was monitored for 14 days. The predominant rhythm was sinus  with underlying bundle branch block with an average rate of 64 bpm (range 47-128 bpm and sinus). There were frequent PACs (~10% burden) and rare PVCs. 106 atrial runs occurred, lasting up to 4 minutes, 11 seconds with a maximum rate of 179 bpm. No prolonged pause (greater than 3 seconds) was observed. There were no patient triggered events.   Normal sinus rhythm with frequent PACs and multiple episodes of SVT lasting up to 4 minutes, 11 seconds.  Rare PVCs without NSVT noted.  Myoview Lexiscan 01/2018 Narrative & Impression  Normal pharmacologic myocardial perfusion stress test without ischemia or scar. Normal left ventricular systolic function (LVEF 54%). This is a low risk study. Baseline hypertension and sinus bradycardia. Appropriate heart rate response noted with regadenoson.    EKG:  EKG is  ordered today.  The ekg ordered today demonstrates NSR frequent PVCs, ventrivular bigeminy, 66bpm, LBBBLAD  Recent Labs: 05/10/2021: Magnesium 1.7 01/17/2022: ALT 5; BUN 17; Creatinine, Ser 0.62; Potassium 4.4; Sodium 137  Recent Lipid Panel    Component Value Date/Time   CHOL 170 01/17/2022 1058   TRIG 57 01/17/2022 1058   HDL 62 01/17/2022 1058   CHOLHDL 2.2 07/23/2019 0946   LDLCALC 97 01/17/2022 1058    Physical Exam:    VS:  There were no vitals taken for this visit.    Wt Readings from Last 3 Encounters:  05/06/22 168 lb (76.2 kg)  04/04/22 160 lb (72.6 kg)  02/12/22 174 lb 2.6 oz (79 kg)     GEN:  Well nourished, well developed in no acute distress HEENT: Normal NECK: No JVD; No carotid bruits LYMPHATICS: No lymphadenopathy CARDIAC: RRR, + murmur,no  rubs, gallops RESPIRATORY:  Clear to auscultation without rales, wheezing or rhonchi  ABDOMEN: Soft, non-tender, non-distended MUSCULOSKELETAL:  No edema; No deformity  SKIN: Warm and dry NEUROLOGIC:  Alert and oriented x 3 PSYCHIATRIC:  Normal affect   ASSESSMENT:    No diagnosis found. PLAN:    In order of  problems listed above:  PVCs/pSVT/NSVT EKG shows frequent PVCs, ventricular bigeminy. Prior heart monitor showed 10% PAC burden and EP referral was recommended but declined. She is asymptomatic. I will check TSHA and Mag. I will re-check an echo. Avoid BB/CCB with h/o bradycardia. I will refer to EP.   Valvular heart disease She is euvolemic on exam. Echo in 2022 showed normal LVEF with mod MR/TR and mild AI. I will repeat echo as above.   HTN BP is borderline soft.   Disposition: Follow up in 2 month(s) with MD/APP      Signed, Casidy Alberta David Stall, PA-C  05/09/2022 9:03 AM    [facility]

## 2022-05-09 NOTE — Patient Instructions (Addendum)
Medication Instructions:   Your physician recommends that you continue on your current medications as directed. Please refer to the Current Medication list given to you today.  *If you need a refill on your cardiac medications before your next appointment, please call your pharmacy*   Lab Work:  Today at the medical mall: TSH, Magnesium  -  Please go to the Medical Mall Entrance at [facility] -  Check in at the Registration Desk: 1st desk to the right, past the screening table  If you have labs (blood work) drawn today and your tests are completely normal, you will receive your results only by: MyChart Message (if you have MyChart) OR A paper copy in the mail If you have any lab test that is abnormal or we need to change your treatment, we will call you to review the results.   Testing/Procedures:  1) Your physician has requested that you have an echocardiogram (AFTER 2 week zio monitor). Echocardiography is a painless test that uses sound waves to create images of your heart. It provides your doctor with information about the size and shape of your heart and how well your heart's chambers and valves are working. This procedure takes approximately one hour. There are no restrictions for this procedure.  2) Your physician has recommended that you wear a Zio monitor for TWO WEEKS. This will be mailed to you.   This monitor is a medical device that records the heart's electrical activity. Doctors most often use these monitors to diagnose arrhythmias. Arrhythmias are problems with the speed or rhythm of the heartbeat. The monitor is a small device applied to your chest. You can wear one while you do your normal daily activities. While wearing this monitor if you have any symptoms to push the button and record what you felt. Once you have worn this monitor for the period of time provider prescribed (Usually 14 days), you will return the monitor device in the postage paid box. Once it is returned they  will download the data collected and provide [place] with a report which the provider will then review and we will call you with those results. Important tips:  Avoid showering during the first 24 hours of wearing the monitor. Avoid excessive sweating to help maximize wear time. Do not submerge the device, no hot tubs, and no swimming pools. Keep any lotions or oils away from the patch. After 24 hours you may shower with the patch on. Take brief showers with your back facing the shower head.  Do not remove patch once it has been placed because that will interrupt data and decrease adhesive wear time. Push the button when you have any symptoms and write down what you were feeling. Once you have completed wearing your monitor, remove and place into box which has postage paid and place in your outgoing mailbox.  If for some reason you have misplaced your box then call our office and we can provide another box and/or mail it off for you.   Follow-Up: At [facility], you and your health needs are our priority.  As part of our continuing mission to provide you with exceptional heart care, we have created designated Provider Care Teams.  These Care Teams include your primary Cardiologist (physician) and Advanced Practice Providers (APPs -  Physician Assistants and Nurse Practitioners) who all work together to provide you with the care you need, when you need it.  We recommend signing up for the patient portal called "MyChart".  Sign up  information is provided on this After Visit Summary.  MyChart is used to connect with patients for Virtual Visits (Telemedicine).  Patients are able to view lab/test results, encounter notes, upcoming appointments, etc.  Non-urgent messages can be sent to your provider as well.   To learn more about what you can do with MyChart, go to [email or web address].    Your next appointment:   2 month(s)  The format for your next appointment:   In Person  Provider:   You may  see Yvonne Kendall, MD or one of the following Advanced Practice Providers on your designated Care Team:   Nicolasa Ducking, NP Eula Listen, PA-C Cadence Fransico Michael, PA-C{   Other Instructions  You have been referred to electrophysiology (EP) cardiology for PVCs.  Important Information About [place]

## 2022-05-10 ENCOUNTER — Other Ambulatory Visit: Payer: Self-pay

## 2022-05-10 ENCOUNTER — Telehealth: Payer: Self-pay | Admitting: Family Medicine

## 2022-05-10 DIAGNOSIS — R058 Other specified cough: Secondary | ICD-10-CM

## 2022-05-10 MED ORDER — BENZONATATE 100 MG PO CAPS: 100.0000 mg | ORAL_CAPSULE | Freq: Two times a day (BID) | ORAL | 0 refills | Status: DC | PRN

## 2022-05-10 NOTE — Progress Notes (Signed)
Sent in tessalon perles [address]

## 2022-05-10 NOTE — Telephone Encounter (Signed)
Copied from CRM [id number]. Topic: General - Other >> May 10, 2022  1:13 PM Ja-Kwan M wrote: Reason for CRM: Pt daughter Marton Redwood requests return call from [place] at [phone]

## 2022-05-12 DIAGNOSIS — I493 Ventricular premature depolarization: Secondary | ICD-10-CM | POA: Diagnosis not present

## 2022-05-13 ENCOUNTER — Encounter: Payer: Medicare Other | Admitting: Physician Assistant

## 2022-05-13 DIAGNOSIS — I10 Essential (primary) hypertension: Secondary | ICD-10-CM | POA: Diagnosis not present

## 2022-05-13 DIAGNOSIS — I2510 Atherosclerotic heart disease of native coronary artery without angina pectoris: Secondary | ICD-10-CM | POA: Diagnosis not present

## 2022-05-13 DIAGNOSIS — E11622 Type 2 diabetes mellitus with other skin ulcer: Secondary | ICD-10-CM | POA: Diagnosis not present

## 2022-05-13 DIAGNOSIS — L89323 Pressure ulcer of left buttock, stage 3: Secondary | ICD-10-CM | POA: Diagnosis not present

## 2022-05-13 DIAGNOSIS — L89313 Pressure ulcer of right buttock, stage 3: Secondary | ICD-10-CM | POA: Diagnosis not present

## 2022-05-16 ENCOUNTER — Encounter: Payer: Self-pay | Admitting: Student in an Organized Health Care Education/Training Program

## 2022-05-17 ENCOUNTER — Ambulatory Visit: Payer: Medicare Other | Admitting: Family Medicine

## 2022-05-17 ENCOUNTER — Encounter: Payer: Self-pay | Admitting: Student in an Organized Health Care Education/Training Program

## 2022-05-17 ENCOUNTER — Ambulatory Visit
Payer: Medicare Other | Attending: Student in an Organized Health Care Education/Training Program | Admitting: Student in an Organized Health Care Education/Training Program

## 2022-05-17 DIAGNOSIS — M25562 Pain in left knee: Secondary | ICD-10-CM

## 2022-05-17 DIAGNOSIS — G8929 Other chronic pain: Secondary | ICD-10-CM

## 2022-05-17 DIAGNOSIS — G894 Chronic pain syndrome: Secondary | ICD-10-CM | POA: Diagnosis not present

## 2022-05-17 DIAGNOSIS — Z96652 Presence of left artificial knee joint: Secondary | ICD-10-CM

## 2022-05-17 DIAGNOSIS — Z96651 Presence of right artificial knee joint: Secondary | ICD-10-CM | POA: Diagnosis not present

## 2022-05-17 DIAGNOSIS — T8484XS Pain due to internal orthopedic prosthetic devices, implants and grafts, sequela: Secondary | ICD-10-CM | POA: Diagnosis not present

## 2022-05-17 DIAGNOSIS — Z96653 Presence of artificial knee joint, bilateral: Secondary | ICD-10-CM | POA: Diagnosis not present

## 2022-05-17 DIAGNOSIS — M25561 Pain in right knee: Secondary | ICD-10-CM

## 2022-05-17 MED ORDER — TIZANIDINE HCL 4 MG PO TABS: 2.0000 mg | ORAL_TABLET | Freq: Two times a day (BID) | ORAL | 1 refills | Status: AC | PRN

## 2022-05-27 ENCOUNTER — Encounter: Payer: Medicare Other | Attending: Physician Assistant | Admitting: Physician Assistant

## 2022-05-27 DIAGNOSIS — I2510 Atherosclerotic heart disease of native coronary artery without angina pectoris: Secondary | ICD-10-CM | POA: Diagnosis not present

## 2022-05-27 DIAGNOSIS — L89323 Pressure ulcer of left buttock, stage 3: Secondary | ICD-10-CM | POA: Insufficient documentation

## 2022-05-27 DIAGNOSIS — E1151 Type 2 diabetes mellitus with diabetic peripheral angiopathy without gangrene: Secondary | ICD-10-CM | POA: Diagnosis not present

## 2022-05-27 DIAGNOSIS — L89313 Pressure ulcer of right buttock, stage 3: Secondary | ICD-10-CM | POA: Insufficient documentation

## 2022-05-27 DIAGNOSIS — I10 Essential (primary) hypertension: Secondary | ICD-10-CM | POA: Diagnosis not present

## 2022-05-27 DIAGNOSIS — L98412 Non-pressure chronic ulcer of buttock with fat layer exposed: Secondary | ICD-10-CM | POA: Diagnosis not present

## 2022-05-27 DIAGNOSIS — E11622 Type 2 diabetes mellitus with other skin ulcer: Secondary | ICD-10-CM | POA: Diagnosis not present

## 2022-05-27 NOTE — Progress Notes (Signed)
ERILYN, PEARMAN ([id number]) Visit Report for 05/27/2022 Chief Complaint Document Details Patient Name: [name], [name] Date of Service: 05/27/2022 11:30 AM Medical Record Number: [id number] Patient Account Number: [id number] Date of Birth/Sex: 02/22/1932 (88 y.o. F) Treating RN: Levora Dredge Primary Care Provider: Otilio Miu Other Clinician: Referring Provider: Otilio Miu Treating Provider/Extender: Skipper Cliche in Treatment: 12 Information Obtained from: Patient Chief Complaint Bilateral buttock ulcers Electronic Signature(s) Signed: 05/27/2022 2:43:10 PM By: Worthy Keeler PA-C Entered By: Worthy Keeler on 05/27/2022 14:43:10 [name] ([id number]) -------------------------------------------------------------------------------- HPI Details Patient Name: [name] Date of Service: 05/27/2022 11:30 AM Medical Record Number: [id number] Patient Account Number: [id number] Date of Birth/Sex: 1932/07/08 (88 y.o. F) Treating RN: Levora Dredge Primary Care Provider: Otilio Miu Other Clinician: Referring Provider: Otilio Miu Treating Provider/Extender: Skipper Cliche in Treatment: 12 History of Present Illness HPI Description: 03-04-2022 upon evaluation today patient appears to be doing poorly in regard to her gluteal region. Its mainly in the left gluteal region where there is an open ulceration at this point. This does appear to be a stage III pressure ulcer. Fortunately there does not appear to be any evidence of active infection at this site locally nor systemically which is also news. The patient has been counseled by her daughter who is present with her today her son was also on the phone we had a good discussion at this point. Nonetheless they have been trying to get her to not sit as long as they knew that was the major issue the patient tells me however she has a hard time getting up because of her knee. She has had of extensive work-up and  treatment for the knee and fortunately has been determined this is just arthritis and there is really not much more that they can do. Nonetheless I think she is going to have to get up and move around or at least get up and stand and allow for blood flow into the gluteal area or out she is can end up with more significant wounds than what we have and see right now. Patient does have a history of diabetes mellitus type 2, hypertension, and coronary artery disease. This wound has been present for at this point roughly 1 month beginning on or around February 04, 2022. 03-21-2022 upon evaluation today patient's wound actually showing signs of excellent improvement I am very pleased with where we stand today there does not appear to be any signs of infection which is great news and overall I think we are headed in the right direction. This is not terribly smaller but it is also looking better than what it was as far as the health of the surface of the wound is concerned. 04-15-2022 upon evaluation today patient appears to be doing well in regard to 1 side of her gluteal area not as well and the other that was healed last time. The right side is reopened which was previously closed. The left side is not as bad and in fact is very close to complete closure. Fortunately I do not see any evidence of active infection locally or systemically at this time which is great news. Overall I think that we are headed in the right direction but still regular probably need to put something on both sides to protect at this point and try to get these areas healed. 04-29-2022 upon evaluation today patient appears to be doing well with regard to her wounds both are shown signs of improvement  and very pleased in that regard. I do not see evidence of infection locally or systemically currently. 05-13-2022 upon evaluation today patient's wounds are showing signs of some improvement though she does have some need for sharp debridement  today as well. I did have a pretty lengthy discussion with her about what causes these wounds and how to prevent them. She voiced understanding I know she has a hard time standing up and moving around but even if she can just stand at the side of her bed this would be something. She voiced understanding. 05-27-2022 upon evaluation today patient appears to be doing okay in regard to her wound. Fortunately there does not appear to be any signs of active infection locally or systemically at this time which is great news. No fevers, chills, nausea, vomiting, or diarrhea. With that being said both wounds are still open but unfortunately she is having a lot of issues with discomfort and the dressing just are not staying in place. Fortunately I do not see again in the significant drainage which is good news. Electronic Signature(s) Signed: 05/27/2022 2:59:21 PM By: Worthy Keeler PA-C Entered By: Worthy Keeler on 05/27/2022 14:59:20 [name] ([id number]) -------------------------------------------------------------------------------- Physical Exam Details Patient Name: [name] Date of Service: 05/27/2022 11:30 AM Medical Record Number: [id number] Patient Account Number: [id number] Date of Birth/Sex: 1932-09-01 (88 y.o. F) Treating RN: Levora Dredge Primary Care Provider: Otilio Miu Other Clinician: Referring Provider: Otilio Miu Treating Provider/Extender: Jeri Cos Weeks in Treatment: 74 Constitutional Well-nourished and well-hydrated in no acute distress. Respiratory normal breathing without difficulty. Psychiatric this patient is able to make decisions and demonstrates good insight into disease process. Alert and Oriented x 3. pleasant and cooperative. Notes Upon evaluation patient's wound actually showed signs of good granulation and epithelization with that being said she does have a lot of irritation still from what I see she is sitting much too often on this area.  I do believe that she needs to be off of this much more than what she is. The patient voiced understanding. Her caregiver was here with her as well and also contacted another family member on the phone during the evaluation to let him here as well his gait was butch. With that being said in the end we do have a plan she is going to need to get up at least every hour for about 5 minutes to let her blood flow. She is also supposed to be using a pillow to offload from 1 side to the other under the hip keep pressure off of this midline gluteal/sacral region. I think with these items in place this should help a lot with healing. Electronic Signature(s) Signed: 05/27/2022 3:01:19 PM By: Worthy Keeler PA-C Entered By: Worthy Keeler on 05/27/2022 15:01:19 [name] ([id number]) -------------------------------------------------------------------------------- Physician Orders Details Patient Name: [name] Date of Service: 05/27/2022 11:30 AM Medical Record Number: [id number] Patient Account Number: [id number] Date of Birth/Sex: 1932/10/04 (88 y.o. F) Treating RN: Levora Dredge Primary Care Provider: Otilio Miu Other Clinician: Referring Provider: Otilio Miu Treating Provider/Extender: Skipper Cliche in Treatment: 12 Verbal / Phone Orders: No Diagnosis Coding Follow-up Appointments o Return Appointment in 2 weeks. Bathing/ Shower/ Hygiene o Wash wounds with antibacterial soap and water. - change dressing after shower o No tub bath. Anesthetic (Use 'Patient Medications' Section for Anesthetic Order Entry) o Lidocaine applied to wound bed Edema Control - Lymphedema / Segmental Compressive Device / Other o DO YOUR BEST  to sleep in the bed at night. DO NOT sleep in your recliner. Long hours of sitting in a recliner leads to swelling of the legs and/or potential wounds on your backside. - Try to sleep in the bed Off-Loading o Gel wheelchair cushion o Turn and  reposition every 2 hours Additional Orders / Instructions o Follow Nutritious Diet and Increase Protein Intake - monitor blood sugar also o Other: - calmoseptine recommended to use on wounds Wound Treatment Wound #1 - Gluteus Wound Laterality: Left Cleanser: Byram Ancillary Kit - 15 Day Supply (Generic) 1 x Per Day/15 Days Discharge Instructions: Use supplies as instructed; Kit contains: (15) Saline Bullets; (15) 3x3 Gauze; 15 pr Gloves Cleanser: Soap and Water 1 x Per Day/15 Days Discharge Instructions: Gently cleanse wound with antibacterial soap, rinse and pat dry prior to dressing wounds Cleanser: Wound Cleanser 1 x Per Day/15 Days Discharge Instructions: Wash your hands with soap and water. Remove old dressing, discard into plastic bag and place into trash. Cleanse the wound with Wound Cleanser prior to applying a clean dressing using gauze sponges, not tissues or cotton balls. Do not scrub or use excessive force. Pat dry using gauze sponges, not tissue or cotton balls. Topical: Desitin Maximum Strength Ointment, 1 (oz) tube 1 x Per Day/15 Days Wound #2 - Gluteus Wound Laterality: Right Cleanser: Byram Ancillary Kit - 15 Day Supply (Generic) 1 x Per Day/15 Days Discharge Instructions: Use supplies as instructed; Kit contains: (15) Saline Bullets; (15) 3x3 Gauze; 15 pr Gloves Cleanser: Soap and Water 1 x Per Day/15 Days Discharge Instructions: Gently cleanse wound with antibacterial soap, rinse and pat dry prior to dressing wounds Cleanser: Wound Cleanser 1 x Per Day/15 Days Discharge Instructions: Wash your hands with soap and water. Remove old dressing, discard into plastic bag and place into trash. Cleanse the wound with Wound Cleanser prior to applying a clean dressing using gauze sponges, not tissues or cotton balls. Do not scrub or use excessive force. Pat dry using gauze sponges, not tissue or cotton balls. Topical: Desitin Maximum Strength Ointment, 1 (oz) tube 1 x Per Day/15  Days Electronic Signature(s) Signed: 05/27/2022 4:30:05 PM By: Chinita Pester ([id number]) Signed: 05/27/2022 5:04:18 PM By: Worthy Keeler PA-C Entered By: Levora Dredge on 05/27/2022 12:00:32 [name], [name] ([id number]) -------------------------------------------------------------------------------- Problem List Details Patient Name: [name] Date of Service: 05/27/2022 11:30 AM Medical Record Number: [id number] Patient Account Number: [id number] Date of Birth/Sex: 1932/08/14 (88 y.o. F) Treating RN: Levora Dredge Primary Care Provider: Otilio Miu Other Clinician: Referring Provider: Otilio Miu Treating Provider/Extender: Jeri Cos Weeks in Treatment: 12 Active Problems ICD-10 Encounter Code Description Active Date MDM Diagnosis [id number] Pressure ulcer of left buttock, stage 3 03/04/2022 No Yes L89.313 Pressure ulcer of right buttock, stage 3 04/15/2022 No Yes E11.622 Type 2 diabetes mellitus with other skin ulcer 03/04/2022 No Yes I10 Essential (primary) hypertension 03/04/2022 No Yes I25.10 Atherosclerotic heart disease of native coronary artery without angina 03/04/2022 No Yes pectoris Inactive Problems Resolved Problems Electronic Signature(s) Signed: 05/27/2022 2:43:05 PM By: Worthy Keeler PA-C Entered By: Worthy Keeler on 05/27/2022 14:43:05 [name] ([id number]) -------------------------------------------------------------------------------- Progress Note Details Patient Name: [name] Date of Service: 05/27/2022 11:30 AM Medical Record Number: [id number] Patient Account Number: [id number] Date of Birth/Sex: 1931/11/23 (88 y.o. F) Treating RN: Levora Dredge Primary Care Provider: Otilio Miu Other Clinician: Referring Provider: Otilio Miu Treating Provider/Extender: Skipper Cliche in Treatment: 12 Subjective Chief Complaint Information obtained from  Patient Bilateral buttock ulcers History of Present  Illness (HPI) 03-04-2022 upon evaluation today patient appears to be doing poorly in regard to her gluteal region. Its mainly in the left gluteal region where there is an open ulceration at this point. This does appear to be a stage III pressure ulcer. Fortunately there does not appear to be any evidence of active infection at this site locally nor systemically which is also news. The patient has been counseled by her daughter who is present with her today her son was also on the phone we had a good discussion at this point. Nonetheless they have been trying to get her to not sit as long as they knew that was the major issue the patient tells me however she has a hard time getting up because of her knee. She has had of extensive work-up and treatment for the knee and fortunately has been determined this is just arthritis and there is really not much more that they can do. Nonetheless I think she is going to have to get up and move around or at least get up and stand and allow for blood flow into the gluteal area or out she is can end up with more significant wounds than what we have and see right now. Patient does have a history of diabetes mellitus type 2, hypertension, and coronary artery disease. This wound has been present for at this point roughly 1 month beginning on or around February 04, 2022. 03-21-2022 upon evaluation today patient's wound actually showing signs of excellent improvement I am very pleased with where we stand today there does not appear to be any signs of infection which is great news and overall I think we are headed in the right direction. This is not terribly smaller but it is also looking better than what it was as far as the health of the surface of the wound is concerned. 04-15-2022 upon evaluation today patient appears to be doing well in regard to 1 side of her gluteal area not as well and the other that was healed last time. The right side is reopened which was previously  closed. The left side is not as bad and in fact is very close to complete closure. Fortunately I do not see any evidence of active infection locally or systemically at this time which is great news. Overall I think that we are headed in the right direction but still regular probably need to put something on both sides to protect at this point and try to get these areas healed. 04-29-2022 upon evaluation today patient appears to be doing well with regard to her wounds both are shown signs of improvement and very pleased in that regard. I do not see evidence of infection locally or systemically currently. 05-13-2022 upon evaluation today patient's wounds are showing signs of some improvement though she does have some need for sharp debridement today as well. I did have a pretty lengthy discussion with her about what causes these wounds and how to prevent them. She voiced understanding I know she has a hard time standing up and moving around but even if she can just stand at the side of her bed this would be something. She voiced understanding. 05-27-2022 upon evaluation today patient appears to be doing okay in regard to her wound. Fortunately there does not appear to be any signs of active infection locally or systemically at this time which is great news. No fevers, chills, nausea, vomiting, or diarrhea. With that  being said both wounds are still open but unfortunately she is having a lot of issues with discomfort and the dressing just are not staying in place. Fortunately I do not see again in the significant drainage which is good news. Objective Constitutional Well-nourished and well-hydrated in no acute distress. Vitals Time Taken: 11:35 AM, Height: 63 in, Weight: 174 lbs, BMI: 30.8, Temperature: 97.8 F, Pulse: 79 bpm, Respiratory Rate: 18 breaths/min, Blood Pressure: 136/68 mmHg. Respiratory normal breathing without difficulty. Psychiatric this patient is able to make decisions and  demonstrates good insight into disease process. Alert and Oriented x 3. pleasant and cooperative. [name], [name] ([id number]) General Notes: Upon evaluation patient's wound actually showed signs of good granulation and epithelization with that being said she does have a lot of irritation still from what I see she is sitting much too often on this area. I do believe that she needs to be off of this much more than what she is. The patient voiced understanding. Her caregiver was here with her as well and also contacted another family member on the phone during the evaluation to let him here as well his gait was butch. With that being said in the end we do have a plan she is going to need to get up at least every hour for about 5 minutes to let her blood flow. She is also supposed to be using a pillow to offload from 1 side to the other under the hip keep pressure off of this midline gluteal/sacral region. I think with these items in place this should help a lot with healing. Integumentary (Hair, Skin) Wound #1 status is Open. Original cause of wound was Gradually Appeared. The date acquired was: 02/04/2022. The wound has been in treatment 12 weeks. The wound is located on the Left Gluteus. The wound measures 1cm length x 0.2cm width x 0.1cm depth; 0.157cm^2 area and 0.016cm^3 volume. There is Fat Layer (Subcutaneous Tissue) exposed. There is no tunneling or undermining noted. There is a medium amount of serosanguineous drainage noted. There is large (67-100%) pink granulation within the wound bed. There is no necrotic tissue within the wound bed. Wound #2 status is Open. Original cause of wound was Gradually Appeared. The date acquired was: 04/07/2022. The wound has been in treatment 6 weeks. The wound is located on the Right Gluteus. The wound measures 0.5cm length x 2cm width x 0.1cm depth; 0.785cm^2 area and 0.079cm^3 volume. There is Fat Layer (Subcutaneous Tissue) exposed. There is no tunneling or  undermining noted. There is a medium amount of serosanguineous drainage noted. There is large (67-100%) red, pink granulation within the wound bed. There is no necrotic tissue within the wound bed. Assessment Active Problems ICD-10 Pressure ulcer of left buttock, stage 3 Pressure ulcer of right buttock, stage 3 Type 2 diabetes mellitus with other skin ulcer Essential (primary) hypertension Atherosclerotic heart disease of native coronary artery without angina pectoris Plan Follow-up Appointments: Return Appointment in 2 weeks. Bathing/ Shower/ Hygiene: Wash wounds with antibacterial soap and water. - change dressing after shower No tub bath. Anesthetic (Use 'Patient Medications' Section for Anesthetic Order Entry): Lidocaine applied to wound bed Edema Control - Lymphedema / Segmental Compressive Device / Other: DO YOUR BEST to sleep in the bed at night. DO NOT sleep in your recliner. Long hours of sitting in a recliner leads to swelling of the legs and/or potential wounds on your backside. - Try to sleep in the bed Off-Loading: Gel wheelchair cushion Turn and reposition  every 2 hours Additional Orders / Instructions: Follow Nutritious Diet and Increase Protein Intake - monitor blood sugar also Other: - calmoseptine recommended to use on wounds WOUND #1: - Gluteus Wound Laterality: Left Cleanser: Byram Ancillary Kit - 15 Day Supply (Generic) 1 x Per Day/15 Days Discharge Instructions: Use supplies as instructed; Kit contains: (15) Saline Bullets; (15) 3x3 Gauze; 15 pr Gloves Cleanser: Soap and Water 1 x Per Day/15 Days Discharge Instructions: Gently cleanse wound with antibacterial soap, rinse and pat dry prior to dressing wounds Cleanser: Wound Cleanser 1 x Per Day/15 Days Discharge Instructions: Wash your hands with soap and water. Remove old dressing, discard into plastic bag and place into trash. Cleanse the wound with Wound Cleanser prior to applying a clean dressing using  gauze sponges, not tissues or cotton balls. Do not scrub or use excessive force. Pat dry using gauze sponges, not tissue or cotton balls. Topical: Desitin Maximum Strength Ointment, 1 (oz) tube 1 x Per Day/15 Days WOUND #2: - Gluteus Wound Laterality: Right Cleanser: Byram Ancillary Kit - 15 Day Supply (Generic) 1 x Per Day/15 Days Discharge Instructions: Use supplies as instructed; Kit contains: (15) Saline Bullets; (15) 3x3 Gauze; 15 pr Gloves Cleanser: Soap and Water 1 x Per Day/15 Days Discharge Instructions: Gently cleanse wound with antibacterial soap, rinse and pat dry prior to dressing wounds Cleanser: Wound Cleanser 1 x Per Day/15 Days Discharge Instructions: Wash your hands with soap and water. Remove old dressing, discard into plastic bag and place into trash. Cleanse the wound with Wound Cleanser prior to applying a clean dressing using gauze sponges, not tissues or cotton balls. Do not scrub or use excessive force. Pat dry using gauze sponges, not tissue or cotton balls. Topical: Desitin Maximum Strength Ointment, 1 (oz) tube 1 x Per Day/15 Days [name], [name]. ([id number]) 1. Again I discussed the aggressive offloading with the patient and family today. 2. Also can recommend that we have the patient continue to try as much as possible to stay active and moving as far as standing and allowing blood flow. 3. I am also going to suggest based on what I am seeing currently that we have the patient continue to monitor for any signs of worsening or infection. Obviously if anything changes she should let me know. Her family will likely do so in place of her. That is okay as well. 4. We will otherwise use Calmoseptine or AandD ointment to try to help protect the skin which I think is a good option here. We will see patient back for reevaluation in 2 weeks here in the clinic. If anything worsens or changes patient will contact our office for additional recommendations. Electronic  Signature(s) Signed: 05/27/2022 3:02:42 PM By: Worthy Keeler PA-C Entered By: Worthy Keeler on 05/27/2022 15:02:42 [name] ([id number]) -------------------------------------------------------------------------------- SuperBill Details Patient Name: [name] Date of Service: 05/27/2022 Medical Record Number: [id number] Patient Account Number: [id number] Date of Birth/Sex: [birth year] (88 y.o. F) Treating RN: Levora Dredge Primary Care Provider: Otilio Miu Other Clinician: Referring Provider: Otilio Miu Treating Provider/Extender: Jeri Cos Weeks in Treatment: 12 Diagnosis Coding ICD-10 Codes Code Description [phone] Pressure ulcer of left buttock, stage 3 L89.313 Pressure ulcer of right buttock, stage 3 E11.622 Type 2 diabetes mellitus with other skin ulcer I10 Essential (primary) hypertension I25.10 Atherosclerotic heart disease of native coronary artery without angina pectoris Facility Procedures CPT4 Code: [id number] Description: 99213 - WOUND CARE VISIT-LEV 3 EST PT Modifier: Quantity: 1 Physician Procedures CPT4  Code: [id number] Description: 74128 - WC PHYS LEVEL 3 - EST PT Modifier: Quantity: 1 CPT4 Code: Description: ICD-10 Diagnosis Description L89.323 Pressure ulcer of left buttock, stage 3 L89.313 Pressure ulcer of right buttock, stage 3 E11.622 Type 2 diabetes mellitus with other skin ulcer I10 Essential (primary) hypertension Modifier: Quantity: Electronic Signature(s) Signed: 05/27/2022 3:02:55 PM By: Worthy Keeler PA-C Entered By: Worthy Keeler on 05/27/2022 15:02:55

## 2022-05-27 NOTE — Progress Notes (Signed)
Madison, Reynolds ([id number]) Visit Report for 05/27/2022 Arrival Information Details Patient Name: [name], [name] Date of Service: 05/27/2022 11:30 AM Medical Record Number: [id number] Patient Account Number: [id number] Date of Birth/Sex: 08-12-1932 (88 y.o. F) Treating RN: Levora Dredge Primary Care Shaunn Tackitt: Otilio Miu Other Clinician: Referring Rhylee Nunn: Otilio Miu Treating Daylen Lipsky/Extender: Skipper Cliche in Treatment: 12 Visit Information History Since Last Visit Added or deleted any medications: No Patient Arrived: Wheel Chair Any new allergies or adverse reactions: No Arrival Time: 11:38 Had a fall or experienced change in No Accompanied By: daughter activities of daily living that may affect Transfer Assistance: EasyPivot Patient risk of falls: Lift Hospitalized since last visit: No Patient Identification Verified: Yes Has Dressing in Place as Prescribed: Yes Secondary Verification Process Completed: Yes Pain Present Now: No Patient Requires Transmission-Based No Precautions: Patient Has Alerts: Yes Patient Alerts: DIABETIC Electronic Signature(s) Signed: 05/27/2022 4:30:05 PM By: Levora Dredge Entered By: Levora Dredge on 05/27/2022 11:38:38 [name] ([id number]) -------------------------------------------------------------------------------- Clinic Level of Care Assessment Details Patient Name: [name] Date of Service: 05/27/2022 11:30 AM Medical Record Number: [id number] Patient Account Number: [id number] Date of Birth/Sex: [birth year] (88 y.o. F) Treating RN: Levora Dredge Primary Care Lumir Demetriou: Otilio Miu Other Clinician: Referring Emera Bussie: Otilio Miu Treating Tatsuo Musial/Extender: Skipper Cliche in Treatment: 12 Clinic Level of Care Assessment Items TOOL 4 Quantity Score _0  - Use when only an EandM is performed on FOLLOW-UP visit 0 ASSESSMENTS - Nursing Assessment / Reassessment X - Reassessment of Co-morbidities  (includes updates in patient status) 1 10 X- 1 5 Reassessment of Adherence to Treatment Plan ASSESSMENTS - Wound and Skin Assessment / Reassessment _1  - Simple Wound Assessment / Reassessment - one wound 0 X- 2 5 Complex Wound Assessment / Reassessment - multiple wounds _2  - 0 Dermatologic / Skin Assessment (not related to wound area) ASSESSMENTS - Focused Assessment _3  - Circumferential Edema Measurements - multi extremities 0 _4  - 0 Nutritional Assessment / Counseling / Intervention _5  - 0 Lower Extremity Assessment (monofilament, tuning fork, pulses) _6  - 0 Peripheral Arterial Disease Assessment (using hand held doppler) ASSESSMENTS - Ostomy and/or Continence Assessment and Care _7  - Incontinence Assessment and Management 0 _8  - 0 Ostomy Care Assessment and Management (repouching, etc.) PROCESS - Coordination of Care X - Simple Patient / Family Education for ongoing care 1 15 _9  - 0 Complex (extensive) Patient / Family Education for ongoing care _10  - 0 Staff obtains Programmer, systems, Records, Test Results / Process Orders _11  - 0 Staff telephones HHA, Nursing Homes / Clarify orders / etc _12  - 0 Routine Transfer to another Facility (non-emergent condition) _13  - 0 Routine Hospital Admission (non-emergent condition) _14  - 0 New Admissions / Biomedical engineer / Ordering NPWT, Apligraf, etc. _15  - 0 Emergency Hospital Admission (emergent condition) X- 1 10 Simple Discharge Coordination _16  - 0 Complex (extensive) Discharge Coordination PROCESS - Special Needs _17  - Pediatric / Minor Patient Management 0 _18  - 0 Isolation Patient Management _19  - 0 Hearing / Language / Visual special needs _20  - 0 Assessment of Community assistance (transportation, D/C planning, etc.) _21  - 0 Additional assistance / Altered mentation _22  - 0 Support Surface(s) Assessment (bed, cushion, seat, etc.) INTERVENTIONS - Wound Cleansing / Measurement ANNIBELLE, BRAZIE. ([id number]) _23  - 0 Simple Wound  Cleansing - one wound X- 2 5 Complex Wound Cleansing - multiple wounds X- 1 5 Wound Imaging (photographs - any number of wounds) _24  - 0 Wound Tracing (instead of photographs) _25  - 0  Simple Wound Measurement - one wound X- 2 5 Complex Wound Measurement - multiple wounds INTERVENTIONS - Wound Dressings X - Small Wound Dressing one or multiple wounds 2 10 _0  - 0 Medium Wound Dressing one or multiple wounds _1  - 0 Large Wound Dressing one or multiple wounds X- 1 5 Application of Medications - topical <[id number]>_2<\/[id number]>_7  - 0 Application of Medications - injection INTERVENTIONS - Miscellaneous _3  - External ear exam 0 _4  - 0 Specimen Collection (cultures, biopsies, blood, body fluids, etc.) _5  - 0 Specimen(s) / Culture(s) sent or taken to Lab for analysis X- 1 10 Patient Transfer (multiple staff / Civil Service fast streamer / Similar devices) _6  - 0 Simple Staple / Suture removal (25 or less) _7  - 0 Complex Staple / Suture removal (26 or more) _8  - 0 Hypo / Hyperglycemic Management (close monitor of Blood Glucose) _9  - 0 Ankle / Brachial Index (ABI) - do not check if billed separately X- 1 5 Vital Signs Has the patient been seen at the hospital within the last three years: Yes Total Score: 115 Level Of Care: New/Established - Level 3 Electronic Signature(s) Signed: 05/27/2022 4:30:05 PM By: Levora Dredge Entered By: Levora Dredge on 05/27/2022 12:12:56 [name] ([id number]) -------------------------------------------------------------------------------- Encounter Discharge Information Details Patient Name: [name] Date of Service: 05/27/2022 11:30 AM Medical Record Number: [id number] Patient Account Number: [id number] Date of Birth/Sex: Feb 13, 1932 (88 y.o. F) Treating RN: Levora Dredge Primary Care Luverne Zerkle: Otilio Miu Other Clinician: Referring Sashia Campas: Otilio Miu Treating Xaidyn Kepner/Extender: Skipper Cliche in Treatment: 12 Encounter Discharge Information  Items Discharge Condition: Stable Ambulatory Status: Wheelchair Discharge Destination: Home Transportation: Private Auto Accompanied By: daughter Schedule Follow-up Appointment: Yes Clinical Summary of Care: Electronic Signature(s) Signed: 05/27/2022 4:30:05 PM By: Levora Dredge Entered By: Levora Dredge on 05/27/2022 12:13:42 [name] ([id number]) -------------------------------------------------------------------------------- Lower Extremity Assessment Details Patient Name: [name] Date of Service: 05/27/2022 11:30 AM Medical Record Number: [id number] Patient Account Number: [id number] Date of Birth/Sex: [birth year] (88 y.o. F) Treating RN: Levora Dredge Primary Care Alfonzo Arca: Otilio Miu Other Clinician: Referring Dontay Harm: Otilio Miu Treating Ryla Cauthon/Extender: Jeri Cos Weeks in Treatment: 12 Electronic Signature(s) Signed: 05/27/2022 4:30:05 PM By: Levora Dredge Entered By: Levora Dredge on 05/27/2022 11:46:35 ROSEZELLA, KRONICK ([id number]) -------------------------------------------------------------------------------- Multi Wound Chart Details Patient Name: [name] Date of Service: 05/27/2022 11:30 AM Medical Record Number: [id number] Patient Account Number: [id number] Date of Birth/Sex: Jun 06, 1932 (88 y.o. F) Treating RN: Levora Dredge Primary Care Cristal Howatt: Otilio Miu Other Clinician: Referring Sujey Gundry: Otilio Miu Treating Witney Huie/Extender: Jeri Cos Weeks in Treatment: 12 Vital Signs Height(in): 37 Pulse(bpm): 36 Weight(lbs): 174 Blood Pressure(mmHg): 136/68 Body Mass Index(BMI): 30.8 Temperature(F): 97.8 Respiratory Rate(breaths/min): 18 Photos: [N/A:N/A] Wound Location: Left Gluteus Right Gluteus N/A Wounding Event: Gradually Appeared Gradually Appeared N/A Primary Etiology: Pressure Ulcer Pressure Ulcer N/A Comorbid History: Arrhythmia, Coronary Artery Arrhythmia, Coronary Artery N/A Disease, Hypertension,  Type II Disease, Hypertension, Type II Diabetes, Osteoarthritis Diabetes, Osteoarthritis Date Acquired: 02/04/2022 04/07/2022 N/A Weeks of Treatment: 12 6 N/A Wound Status: Open Open N/A Wound Recurrence: No No N/A Measurements L x W x D (cm) 1x0.2x0.1 0.5x2x0.1 N/A Area (cm) : 0.157 0.785 N/A Volume (cm) : 0.016 0.079 N/A % Reduction in Area: 0.00% -53.60% N/A % Reduction in Volume: 0.00% -54.90% N/A Classification: Category/Stage III Category/Stage III N/A Exudate Amount: Medium Medium N/A Exudate Type: Serosanguineous Serosanguineous N/A Exudate Color: red, brown red, brown N/A Granulation Amount: Large (67-100%) Large (67-100%) N/A Granulation Quality: Pink Red, Pink N/A Necrotic Amount:  None Present (0%) None Present (0%) N/A Exposed Structures: Fat Layer (Subcutaneous Tissue): Fat Layer (Subcutaneous Tissue): N/A Yes Yes Fascia: No Tendon: No Muscle: No Joint: No Bone: No Epithelialization: Medium (34-66%) Small (1-33%) N/A Treatment Notes Electronic Signature(s) Signed: 05/27/2022 11:47:53 AM By: Levora Dredge Entered By: Levora Dredge on 05/27/2022 11:47:53 [name] ([id number]) -------------------------------------------------------------------------------- [place] Details Patient Name: [name] Date of Service: 05/27/2022 11:30 AM Medical Record Number: [id number] Patient Account Number: [id number] Date of Birth/Sex: 1932/02/26 (88 y.o. F) Treating RN: Levora Dredge Primary Care Juston Goheen: Otilio Miu Other Clinician: Referring Gavrielle Streck: Otilio Miu Treating Netha Dafoe/Extender: Skipper Cliche in Treatment: 12 Active Inactive Pressure Nursing Diagnoses: Knowledge deficit related to causes and risk factors for pressure ulcer development Knowledge deficit related to management of pressures ulcers Potential for impaired tissue integrity related to pressure, friction, moisture, and shear Goals: Patient will remain free  from development of additional pressure ulcers Date Initiated: 03/04/2022 Target Resolution Date: 04/15/2022 Goal Status: Active Patient/caregiver will verbalize risk factors for pressure ulcer development Date Initiated: 03/04/2022 Target Resolution Date: 04/15/2022 Goal Status: Active Interventions: Assess: immobility, friction, shearing, incontinence upon admission and as needed Assess offloading mechanisms upon admission and as needed Assess potential for pressure ulcer upon admission and as needed Notes: Electronic Signature(s) Signed: 05/27/2022 11:47:47 AM By: Levora Dredge Entered By: Levora Dredge on 05/27/2022 11:47:46 [name] ([id number]) -------------------------------------------------------------------------------- Pain Assessment Details Patient Name: [name] Date of Service: 05/27/2022 11:30 AM Medical Record Number: [id number] Patient Account Number: [id number] Date of Birth/Sex: 09-18-1932 (88 y.o. F) Treating RN: Levora Dredge Primary Care Manasvini Whatley: Otilio Miu Other Clinician: Referring Juandaniel Manfredo: Otilio Miu Treating Dashea Mcmullan/Extender: Skipper Cliche in Treatment: 12 Active Problems Location of Pain Severity and Description of Pain Patient Has Paino No Site Locations Rate the pain. Current Pain Level: 0 Pain Management and Medication Current Pain Management: Electronic Signature(s) Signed: 05/27/2022 4:30:05 PM By: Levora Dredge Entered By: Levora Dredge on 05/27/2022 11:39:09 [name] ([id number]) -------------------------------------------------------------------------------- Patient/Caregiver Education Details Patient Name: [name] Date of Service: 05/27/2022 11:30 AM Medical Record Number: [id number] Patient Account Number: [id number] Date of Birth/Gender: 09/03/1932 (88 y.o. F) Treating RN: Levora Dredge Primary Care Physician: Otilio Miu Other Clinician: Referring Physician: Otilio Miu Treating  Physician/Extender: Skipper Cliche in Treatment: 12 Education Assessment Education Provided To: Patient and Caregiver Education Topics Provided Pressure: Handouts: Pressure Ulcers: Care and Offloading Methods: Explain/Verbal Responses: State content correctly Wound/Skin Impairment: Handouts: Caring for Your Ulcer Methods: Explain/Verbal Responses: State content correctly Electronic Signature(s) Signed: 05/27/2022 4:30:05 PM By: Levora Dredge Entered By: Levora Dredge on 05/27/2022 12:13:16 [name] ([id number]) -------------------------------------------------------------------------------- Wound Assessment Details Patient Name: [name] Date of Service: 05/27/2022 11:30 AM Medical Record Number: [id number] Patient Account Number: [id number] Date of Birth/Sex: 08/23/1932 (88 y.o. F) Treating RN: Levora Dredge Primary Care Ayane Delancey: Otilio Miu Other Clinician: Referring Katira Dumais: Otilio Miu Treating Karlynn Furrow/Extender: Jeri Cos Weeks in Treatment: 12 Wound Status Wound Number: 1 Primary Pressure Ulcer Etiology: Wound Location: Left Gluteus Wound Open Wounding Event: Gradually Appeared Status: Date Acquired: 02/04/2022 Comorbid Arrhythmia, Coronary Artery Disease, Hypertension, Weeks Of Treatment: 12 History: Type II Diabetes, Osteoarthritis Clustered Wound: No Photos Wound Measurements Length: (cm) 1 Width: (cm) 0.2 Depth: (cm) 0.1 Area: (cm) 0.157 Volume: (cm) 0.016 % Reduction in Area: 0% % Reduction in Volume: 0% Epithelialization: Medium (34-66%) Tunneling: No Undermining: No Wound Description Classification: Category/Stage III Exudate Amount: Medium Exudate Type: Serosanguineous Exudate Color: red, brown Foul Odor  After Cleansing: No Slough/Fibrino Yes Wound Bed Granulation Amount: Large (67-100%) Exposed Structure Granulation Quality: Pink Fascia Exposed: No Necrotic Amount: None Present (0%) Fat Layer (Subcutaneous  Tissue) Exposed: Yes Tendon Exposed: No Muscle Exposed: No Joint Exposed: No Bone Exposed: No Treatment Notes Wound #1 (Gluteus) Wound Laterality: Left Cleanser Byram Ancillary Kit - 15 Day Supply Discharge Instruction: Use supplies as instructed; Kit contains: (15) Saline Bullets; (15) 3x3 Gauze; 15 pr Gloves Soap and Water Discharge Instruction: Gently cleanse wound with antibacterial soap, rinse and pat dry prior to dressing wounds ZALIYAH, MEIKLE ([id number]) Wound Cleanser Discharge Instruction: Wash your hands with soap and water. Remove old dressing, discard into plastic bag and place into trash. Cleanse the wound with Wound Cleanser prior to applying a clean dressing using gauze sponges, not tissues or cotton balls. Do not scrub or use excessive force. Pat dry using gauze sponges, not tissue or cotton balls. Peri-Wound Care Topical Desitin Maximum Strength Ointment, 1 (oz) tube Primary Dressing Secondary Dressing Secured With Compression Wrap Compression Stockings Add-Ons Electronic Signature(s) Signed: 05/27/2022 4:30:05 PM By: Levora Dredge Entered By: Levora Dredge on 05/27/2022 11:45:56 [name] ([id number]) -------------------------------------------------------------------------------- Wound Assessment Details Patient Name: [name] Date of Service: 05/27/2022 11:30 AM Medical Record Number: [id number] Patient Account Number: [id number] Date of Birth/Sex: [birth year] (88 y.o. F) Treating RN: Levora Dredge Primary Care Samuell Knoble: Otilio Miu Other Clinician: Referring Deakin Lacek: Otilio Miu Treating Cire Clute/Extender: Jeri Cos Weeks in Treatment: 12 Wound Status Wound Number: 2 Primary Pressure Ulcer Etiology: Wound Location: Right Gluteus Wound Open Wounding Event: Gradually Appeared Status: Date Acquired: 04/07/2022 Comorbid Arrhythmia, Coronary Artery Disease, Hypertension, Weeks Of Treatment: 6 History: Type II Diabetes,  Osteoarthritis Clustered Wound: No Photos Wound Measurements Length: (cm) 0.5 Width: (cm) 2 Depth: (cm) 0.1 Area: (cm) 0.785 Volume: (cm) 0.079 % Reduction in Area: -53.6% % Reduction in Volume: -54.9% Epithelialization: Small (1-33%) Tunneling: No Undermining: No Wound Description Classification: Category/Stage III Exudate Amount: Medium Exudate Type: Serosanguineous Exudate Color: red, brown Foul Odor After Cleansing: No Slough/Fibrino Yes Wound Bed Granulation Amount: Large (67-100%) Exposed Structure Granulation Quality: Red, Pink Fat Layer (Subcutaneous Tissue) Exposed: Yes Necrotic Amount: None Present (0%) Treatment Notes Wound #2 (Gluteus) Wound Laterality: Right Cleanser Byram Ancillary Kit - 15 Day Supply Discharge Instruction: Use supplies as instructed; Kit contains: (15) Saline Bullets; (15) 3x3 Gauze; 15 pr Gloves Soap and Water Discharge Instruction: Gently cleanse wound with antibacterial soap, rinse and pat dry prior to dressing wounds Wound Cleanser Discharge Instruction: Wash your hands with soap and water. Remove old dressing, discard into plastic bag and place into trash. Cleanse the wound with Wound Cleanser prior to applying a clean dressing using gauze sponges, not tissues or cotton balls. Do not scrub or use excessive force. Pat dry using gauze sponges, not tissue or cotton balls. KARLEE, STAFF ([id number]) Peri-Wound Care Topical Desitin Maximum Strength Ointment, 1 (oz) tube Primary Dressing Secondary Dressing Secured With Compression Wrap Compression Stockings Add-Ons Electronic Signature(s) Signed: 05/27/2022 4:30:05 PM By: Levora Dredge Entered By: Levora Dredge on 05/27/2022 11:46:27 [name] ([id number]) -------------------------------------------------------------------------------- Vitals Details Patient Name: [name] Date of Service: 05/27/2022 11:30 AM Medical Record Number: [id number] Patient Account  Number: [id number] Date of Birth/Sex: [birth year] (88 y.o. F) Treating RN: Levora Dredge Primary Care [place]er [place]: Otilio Miu Other Clinician: Referring Bhakti Labella: Otilio Miu Treating Casia Corti/Extender: Jeri Cos Weeks in Treatment: 12 Vital Signs Time Taken: 11:35 Temperature (F): 97.8 Height (in): 63 Pulse (bpm): 79 Weight (lbs): 174  Respiratory Rate (breaths/min): 18 Body Mass Index (BMI): 30.8 Blood Pressure (mmHg): 136/68 Reference Range: 80 - 120 mg / dl Electronic Signature(s) Signed: 05/27/2022 4:30:05 PM By: Levora Dredge Entered By: Levora Dredge on 05/27/2022 11:38:54

## 2022-05-30 DIAGNOSIS — M179 Osteoarthritis of knee, unspecified: Secondary | ICD-10-CM | POA: Diagnosis not present

## 2022-06-06 DIAGNOSIS — E113393 Type 2 diabetes mellitus with moderate nonproliferative diabetic retinopathy without macular edema, bilateral: Secondary | ICD-10-CM | POA: Diagnosis not present

## 2022-06-06 LAB — HM DIABETES EYE EXAM

## 2022-06-07 DIAGNOSIS — I493 Ventricular premature depolarization: Secondary | ICD-10-CM | POA: Diagnosis not present

## 2022-06-09 ENCOUNTER — Ambulatory Visit (INDEPENDENT_AMBULATORY_CARE_PROVIDER_SITE_OTHER): Payer: Medicare Other

## 2022-06-09 DIAGNOSIS — R9431 Abnormal electrocardiogram [ECG] [EKG]: Secondary | ICD-10-CM

## 2022-06-09 LAB — ECHOCARDIOGRAM COMPLETE
AR max vel: 2.1 cm2
AV Area VTI: 2.5 cm2
AV Area mean vel: 1.96 cm2
AV Mean grad: 6.3 mmHg
AV Peak grad: 10.3 mmHg
Ao pk vel: 1.61 m/s
Area-P 1/2: 2.76 cm2
Calc EF: 73.4 %
S' Lateral: 2.4 cm
Single Plane A2C EF: 74.9 %
Single Plane A4C EF: 72.4 %

## 2022-06-10 ENCOUNTER — Encounter: Payer: Medicare Other | Admitting: Physician Assistant

## 2022-06-10 DIAGNOSIS — I10 Essential (primary) hypertension: Secondary | ICD-10-CM | POA: Diagnosis not present

## 2022-06-10 DIAGNOSIS — E11622 Type 2 diabetes mellitus with other skin ulcer: Secondary | ICD-10-CM | POA: Diagnosis not present

## 2022-06-10 DIAGNOSIS — L89313 Pressure ulcer of right buttock, stage 3: Secondary | ICD-10-CM | POA: Diagnosis not present

## 2022-06-10 DIAGNOSIS — I2510 Atherosclerotic heart disease of native coronary artery without angina pectoris: Secondary | ICD-10-CM | POA: Diagnosis not present

## 2022-06-10 DIAGNOSIS — L98412 Non-pressure chronic ulcer of buttock with fat layer exposed: Secondary | ICD-10-CM | POA: Diagnosis not present

## 2022-06-10 DIAGNOSIS — L89323 Pressure ulcer of left buttock, stage 3: Secondary | ICD-10-CM | POA: Diagnosis not present

## 2022-06-10 DIAGNOSIS — E1151 Type 2 diabetes mellitus with diabetic peripheral angiopathy without gangrene: Secondary | ICD-10-CM | POA: Diagnosis not present

## 2022-06-10 NOTE — Progress Notes (Addendum)
YURIKA, PEREDA ([id number]) Visit Report for 06/10/2022 Arrival Information Details Patient Name: [name], [name] Date of Service: 06/10/2022 11:30 AM Medical Record Number: [id number] Patient Account Number: [id number] Date of Birth/Sex: 04-27-1932 (88 y.o. F) Treating RN: Cornell Barman Primary Care Kani Jobson: Otilio Miu Other Clinician: Referring Suman Trivedi: Otilio Miu Treating Dezaree Tracey/Extender: Skipper Cliche in Treatment: 14 Visit Information History Since Last Visit Pain Present Now: No Patient Arrived: Wheel Chair Arrival Time: 11:41 Accompanied By: daughter Transfer Assistance: Manual Patient Identification Verified: Yes Secondary Verification Process Completed: Yes Patient Requires Transmission-Based Precautions: No Patient Has Alerts: Yes Patient Alerts: DIABETIC Electronic Signature(s) Signed: 06/10/2022 12:58:04 PM By: Gretta Cool, BSN, RN, CWS, Kim RN, BSN Entered By: Gretta Cool, BSN, RN, CWS, Kim on 06/10/2022 11:46:26 [name] ([id number]) -------------------------------------------------------------------------------- Clinic Level of Care Assessment Details Patient Name: [name] Date of Service: 06/10/2022 11:30 AM Medical Record Number: [id number] Patient Account Number: [id number] Date of Birth/Sex: 1932-01-02 (88 y.o. F) Treating RN: Cornell Barman Primary Care Alyda Megna: Otilio Miu Other Clinician: Referring Roda Lauture: Otilio Miu Treating Wyeth Hoffer/Extender: Skipper Cliche in Treatment: 14 Clinic Level of Care Assessment Items TOOL 4 Quantity Score []  - Use when only an EandM is performed on FOLLOW-UP visit 0 ASSESSMENTS - Nursing Assessment / Reassessment X - Reassessment of Co-morbidities (includes updates in patient status) 1 10 X- 1 5 Reassessment of Adherence to Treatment Plan ASSESSMENTS - Wound and Skin Assessment / Reassessment []  - Simple Wound Assessment / Reassessment - one wound 0 X- 2 5 Complex Wound Assessment / Reassessment -  multiple wounds []  - 0 Dermatologic / Skin Assessment (not related to wound area) ASSESSMENTS - Focused Assessment []  - Circumferential Edema Measurements - multi extremities 0 []  - 0 Nutritional Assessment / Counseling / Intervention []  - 0 Lower Extremity Assessment (monofilament, tuning fork, pulses) []  - 0 Peripheral Arterial Disease Assessment (using hand held doppler) ASSESSMENTS - Ostomy and/or Continence Assessment and Care []  - Incontinence Assessment and Management 0 []  - 0 Ostomy Care Assessment and Management (repouching, etc.) PROCESS - Coordination of Care X - Simple Patient / Family Education for ongoing care 1 15 []  - 0 Complex (extensive) Patient / Family Education for ongoing care X- 1 10 Staff obtains Consents, Records, Test Results / Process Orders []  - 0 Staff telephones HHA, Nursing Homes / Clarify orders / etc []  - 0 Routine Transfer to another Facility (non-emergent condition) []  - 0 Routine Hospital Admission (non-emergent condition) []  - 0 New Admissions / Biomedical engineer / Ordering NPWT, Apligraf, etc. []  - 0 Emergency Hospital Admission (emergent condition) X- 1 10 Simple Discharge Coordination []  - 0 Complex (extensive) Discharge Coordination PROCESS - Special Needs []  - Pediatric / Minor Patient Management 0 []  - 0 Isolation Patient Management []  - 0 Hearing / Language / Visual special needs []  - 0 Assessment of Community assistance (transportation, D/C planning, etc.) []  - 0 Additional assistance / Altered mentation []  - 0 Support Surface(s) Assessment (bed, cushion, seat, etc.) INTERVENTIONS - Wound Cleansing / Measurement [name], [name]. ([id number]) X- 1 5 Simple Wound Cleansing - one wound []  - 0 Complex Wound Cleansing - multiple wounds X- 1 5 Wound Imaging (photographs - any number of wounds) []  - 0 Wound Tracing (instead of photographs) X- 1 5 Simple Wound Measurement - one wound []  - 0 Complex Wound  Measurement - multiple wounds INTERVENTIONS - Wound Dressings []  - Small Wound Dressing one or multiple wounds 0 []  - 0 Medium Wound Dressing one or  multiple wounds []  - 0 Large Wound Dressing one or multiple wounds []  - 0 Application of Medications - topical []  - 0 Application of Medications - injection INTERVENTIONS - Miscellaneous []  - External ear exam 0 []  - 0 Specimen Collection (cultures, biopsies, blood, body fluids, etc.) []  - 0 Specimen(s) / Culture(s) sent or taken to Lab for analysis []  - 0 Patient Transfer (multiple staff / Civil Service fast streamer / Similar devices) []  - 0 Simple Staple / Suture removal (25 or less) []  - 0 Complex Staple / Suture removal (26 or more) []  - 0 Hypo / Hyperglycemic Management (close monitor of Blood Glucose) []  - 0 Ankle / Brachial Index (ABI) - do not check if billed separately X- 1 5 Vital Signs Has the patient been seen at the hospital within the last three years: Yes Total Score: 80 Level Of Care: New/Established - Level 3 Electronic Signature(s) Signed: 06/10/2022 12:58:04 PM By: Gretta Cool, BSN, RN, CWS, Kim RN, BSN Entered By: Gretta Cool, BSN, RN, CWS, Kim on 06/10/2022 12:07:10 [name] ([id number]) -------------------------------------------------------------------------------- Encounter Discharge Information Details Patient Name: [name] Date of Service: 06/10/2022 11:30 AM Medical Record Number: [id number] Patient Account Number: [id number] Date of Birth/Sex: [birth year] (88 y.o. F) Treating RN: Cornell Barman Primary Care Sequoya Hogsett: Otilio Miu Other Clinician: Referring Mande Auvil: Otilio Miu Treating Ameka Krigbaum/Extender: Skipper Cliche in Treatment: 14 Encounter Discharge Information Items Discharge Condition: Stable Ambulatory Status: Wheelchair Discharge Destination: Home Transportation: Private Auto Accompanied By: self Schedule Follow-up Appointment: Yes Clinical Summary of Care: Electronic Signature(s) Signed:  06/10/2022 12:58:04 PM By: Gretta Cool, BSN, RN, CWS, Kim RN, BSN Entered By: Gretta Cool, BSN, RN, CWS, Kim on 06/10/2022 12:08:18 [name] ([id number]) -------------------------------------------------------------------------------- Lower Extremity Assessment Details Patient Name: [name] Date of Service: 06/10/2022 11:30 AM Medical Record Number: [id number] Patient Account Number: [id number] Date of Birth/Sex: 03-May-1932 (88 y.o. F) Treating RN: Cornell Barman Primary Care Rita Prom: Otilio Miu Other Clinician: Referring Sharyah Bostwick: Otilio Miu Treating Adeoluwa Silvers/Extender: Skipper Cliche in Treatment: 14 Electronic Signature(s) Signed: 06/10/2022 12:58:04 PM By: Gretta Cool, BSN, RN, CWS, Kim RN, BSN Entered By: Gretta Cool, BSN, RN, CWS, Kim on 06/10/2022 11:59:25 [name], [name] ([id number]) -------------------------------------------------------------------------------- Multi Wound Chart Details Patient Name: [name] Date of Service: 06/10/2022 11:30 AM Medical Record Number: [id number] Patient Account Number: [id number] Date of Birth/Sex: September 14, 1932 (88 y.o. F) Treating RN: Cornell Barman Primary Care Alayziah Tangeman: Otilio Miu Other Clinician: Referring Adonica Fukushima: Otilio Miu Treating Dontrey Snellgrove/Extender: Jeri Cos Weeks in Treatment: 14 Vital Signs Height(in): 63 Pulse(bpm): 8 Weight(lbs): 174 Blood Pressure(mmHg): 135/73 Body Mass Index(BMI): 30.8 Temperature(F): 98.3 Respiratory Rate(breaths/min): 18 Photos: [1:No Photos] [N/A:N/A] Wound Location: Left Gluteus Right Gluteus N/A Wounding Event: Gradually Appeared Gradually Appeared N/A Primary Etiology: Pressure Ulcer Pressure Ulcer N/A Comorbid History: N/A Arrhythmia, Coronary Artery N/A Disease, Hypertension, Type II Diabetes, Osteoarthritis Date Acquired: 02/04/2022 04/07/2022 N/A Weeks of Treatment: 14 8 N/A Wound Status: Healed - Epithelialized Open N/A Wound Recurrence: No No N/A Measurements L x W x D (cm)  0x0x0 0.1x0.1x0.1 N/A Area (cm) : 0 0.008 N/A Volume (cm) : 0 0.001 N/A % Reduction in Area: 100.00% 98.40% N/A % Reduction in Volume: 100.00% 98.00% N/A Classification: Category/Stage III Category/Stage III N/A Exudate Amount: Medium Medium N/A Exudate Type: Serosanguineous Serosanguineous N/A Exudate Color: red, brown red, brown N/A Granulation Amount: N/A Large (67-100%) N/A Granulation Quality: N/A Red, Pink N/A Necrotic Amount: N/A None Present (0%) N/A Epithelialization: N/A Small (1-33%) N/A Treatment Notes Electronic Signature(s) Signed: 06/10/2022 12:58:04 PM By:  Gretta Cool, BSN, RN, CWS, Kim RN, BSN Entered By: Gretta Cool, BSN, RN, CWS, Kim on 06/10/2022 12:06:19 [name] ([id number]) -------------------------------------------------------------------------------- [place] Details Patient Name: [name] Date of Service: 06/10/2022 11:30 AM Medical Record Number: [id number] Patient Account Number: [id number] Date of Birth/Sex: 1932/06/20 (88 y.o. F) Treating RN: Cornell Barman Primary Care Calie Buttrey: Otilio Miu Other Clinician: Referring Rylie Knierim: Otilio Miu Treating Paeton Studer/Extender: Skipper Cliche in Treatment: 14 Active Inactive Pressure Nursing Diagnoses: Knowledge deficit related to causes and risk factors for pressure ulcer development Knowledge deficit related to management of pressures ulcers Potential for impaired tissue integrity related to pressure, friction, moisture, and shear Goals: Patient will remain free from development of additional pressure ulcers Date Initiated: 03/04/2022 Target Resolution Date: 04/15/2022 Goal Status: Active Patient/caregiver will verbalize risk factors for pressure ulcer development Date Initiated: 03/04/2022 Target Resolution Date: 04/15/2022 Goal Status: Active Interventions: Assess: immobility, friction, shearing, incontinence upon admission and as needed Assess offloading mechanisms upon  admission and as needed Assess potential for pressure ulcer upon admission and as needed Notes: Electronic Signature(s) Signed: 06/10/2022 12:58:04 PM By: Gretta Cool, BSN, RN, CWS, Kim RN, BSN Entered By: Gretta Cool, BSN, RN, CWS, Kim on 06/10/2022 12:06:12 [name] ([id number]) -------------------------------------------------------------------------------- Pain Assessment Details Patient Name: [name] Date of Service: 06/10/2022 11:30 AM Medical Record Number: [id number] Patient Account Number: [id number] Date of Birth/Sex: 1932/11/14 (88 y.o. F) Treating RN: Cornell Barman Primary Care Shankar Silber: Otilio Miu Other Clinician: Referring Saskia Simerson: Otilio Miu Treating Levester Waldridge/Extender: Skipper Cliche in Treatment: 14 Active Problems Location of Pain Severity and Description of Pain Patient Has Paino No Site Locations Pain Management and Medication Current Pain Management: Notes patient denies pain at this time. Electronic Signature(s) Signed: 06/10/2022 12:58:04 PM By: Gretta Cool, BSN, RN, CWS, Kim RN, BSN Entered By: Gretta Cool, BSN, RN, CWS, Kim on 06/10/2022 11:48:54 [name] ([id number]) -------------------------------------------------------------------------------- Patient/Caregiver Education Details Patient Name: [name] Date of Service: 06/10/2022 11:30 AM Medical Record Number: [id number] Patient Account Number: [id number] Date of Birth/Gender: [birth year] (88 y.o. F) Treating RN: Cornell Barman Primary Care Physician: Otilio Miu Other Clinician: Referring Physician: Otilio Miu Treating Physician/Extender: Skipper Cliche in Treatment: 14 Education Assessment Education Provided To: Patient Education Topics Provided Pressure: Handouts: Pressure Ulcers: Care and Offloading Methods: Demonstration, Explain/Verbal Responses: State content correctly Wound/Skin Impairment: Handouts: Caring for Your Ulcer, Other: continue wound care as  prescribed Methods: Demonstration Responses: State content correctly Electronic Signature(s) Signed: 06/10/2022 12:58:04 PM By: Gretta Cool, BSN, RN, CWS, Kim RN, BSN Entered By: Gretta Cool, BSN, RN, CWS, Kim on 06/10/2022 12:07:40 [name] ([id number]) -------------------------------------------------------------------------------- Wound Assessment Details Patient Name: [name] Date of Service: 06/10/2022 11:30 AM Medical Record Number: [id number] Patient Account Number: [id number] Date of Birth/Sex: February 28, 1932 (88 y.o. F) Treating RN: Cornell Barman Primary Care Tinzley Dalia: Otilio Miu Other Clinician: Referring Kenyon Eichelberger: Otilio Miu Treating Adwoa Axe/Extender: Jeri Cos Weeks in Treatment: 14 Wound Status Wound Number: 1 Primary Etiology: Pressure Ulcer Wound Location: Left Gluteus Wound Status: Healed - Epithelialized Wounding Event: Gradually Appeared Date Acquired: 02/04/2022 Weeks Of Treatment: 14 Clustered Wound: No Wound Measurements Length: (cm) 0 Width: (cm) 0 Depth: (cm) 0 Area: (cm) 0 Volume: (cm) 0 % Reduction in Area: 100% % Reduction in Volume: 100% Wound Description Classification: Category/Stage III Exudate Amount: Medium Exudate Type: Serosanguineous Exudate Color: red, brown Treatment Notes Wound #1 (Gluteus) Wound Laterality: Left Cleanser Peri-Wound Care Topical Primary Dressing Secondary Dressing Secured With Compression Wrap Compression Stockings Add-Ons Electronic Signature(s) Signed: 06/10/2022  12:58:04 PM By: Gretta Cool, BSN, RN, CWS, Kim RN, BSN Entered By: Gretta Cool, BSN, RN, CWS, Kim on 06/10/2022 11:57:36 [name] ([id number]) -------------------------------------------------------------------------------- Wound Assessment Details Patient Name: [name] Date of Service: 06/10/2022 11:30 AM Medical Record Number: [id number] Patient Account Number: [id number] Date of Birth/Sex: 08/19/1932 (88 y.o. F) Treating RN: Cornell Barman Primary Care Tanganika Barradas: Otilio Miu Other Clinician: Referring Carlisia Geno: Otilio Miu Treating Jewels Langone/Extender: Jeri Cos Weeks in Treatment: 14 Wound Status Wound Number: 2 Primary Pressure Ulcer Etiology: Wound Location: Right Gluteus Wound Open Wounding Event: Gradually Appeared Status: Date Acquired: 04/07/2022 Comorbid Arrhythmia, Coronary Artery Disease, Hypertension, Weeks Of Treatment: 8 History: Type II Diabetes, Osteoarthritis Clustered Wound: No Photos Wound Measurements Length: (cm) 0.1 Width: (cm) 0.1 Depth: (cm) 0.1 Area: (cm) 0.008 Volume: (cm) 0.001 % Reduction in Area: 98.4% % Reduction in Volume: 98% Epithelialization: Small (1-33%) Wound Description Classification: Category/Stage III Exudate Amount: Medium Exudate Type: Serosanguineous Exudate Color: red, brown Foul Odor After Cleansing: No Slough/Fibrino Yes Wound Bed Granulation Amount: Large (67-100%) Exposed Structure Granulation Quality: Red, Pink Fat Layer (Subcutaneous Tissue) Exposed: Yes Necrotic Amount: None Present (0%) Treatment Notes Wound #2 (Gluteus) Wound Laterality: Right Cleanser [place] Ancillary Kit - 15 Day Supply Discharge Instruction: Use supplies as instructed; Kit contains: (15) Saline Bullets; (15) 3x3 Gauze; 15 pr Gloves Soap and Water Discharge Instruction: Gently cleanse wound with antibacterial soap, rinse and pat dry prior to dressing wounds Wound Cleanser Discharge Instruction: Wash your hands with soap and water. Remove old dressing, discard into plastic bag and place into trash. Cleanse the wound with Wound Cleanser prior to applying a clean dressing using gauze sponges, not tissues or cotton balls. Do not scrub or use excessive force. Pat dry using gauze sponges, not tissue or cotton balls. [name], [name] ([id number]) Peri-Wound Care Topical Desitin Maximum Strength Ointment, 1 (oz) tube Primary Dressing Secondary Dressing Secured  With Compression Wrap Compression Stockings Add-Ons Electronic Signature(s) Signed: 06/10/2022 12:58:04 PM By: Gretta Cool, BSN, RN, CWS, Kim RN, BSN Entered By: Gretta Cool, BSN, RN, CWS, Kim on 06/10/2022 11:59:15 [name] ([id number]) -------------------------------------------------------------------------------- [place] Details Patient Name: [name] Date of Service: 06/10/2022 11:30 AM Medical Record Number: [id number] Patient Account Number: [id number] Date of Birth/Sex: Nov 14, 1932 (88 y.o. F) Treating RN: Cornell Barman Primary Care Karrine Kluttz: Otilio Miu Other Clinician: Referring Alejandra Barna: Otilio Miu Treating Elisha Mcgruder/Extender: Skipper Cliche in Treatment: 14 Vital Signs Time Taken: 11:48 Temperature (F): 98.3 Height (in): 63 Pulse (bpm): 78 Weight (lbs): 174 Respiratory Rate (breaths/min): 18 Body Mass Index (BMI): 30.8 Blood Pressure (mmHg): 135/73 Reference Range: 80 - 120 mg / dl Electronic Signature(s) Signed: 06/10/2022 12:58:04 PM By: Gretta Cool, BSN, RN, CWS, Kim RN, BSN Entered By: Gretta Cool, BSN, RN, CWS, Kim on 06/10/2022 11:48:38

## 2022-06-10 NOTE — Progress Notes (Addendum)
Madison, Reynolds ([id number]) Visit Report for 06/10/2022 Chief Complaint Document Details Patient Name: [name], [name] Date of Service: 06/10/2022 11:30 AM Medical Record Number: [id number] Patient Account Number: [id number] Date of Birth/Sex: [birth year] (88 y.o. F) Treating RN: Cornell Barman Primary Care Provider: Otilio Miu Other Clinician: Referring Provider: Otilio Miu Treating Provider/Extender: Jeri Cos Weeks in Treatment: 14 Information Obtained from: Patient Chief Complaint Bilateral buttock ulcers Electronic Signature(s) Signed: 06/10/2022 11:40:25 AM By: Worthy Keeler PA-C Entered By: Worthy Keeler on 06/10/2022 11:40:25 [name] ([id number]) -------------------------------------------------------------------------------- HPI Details Patient Name: [name] Date of Service: 06/10/2022 11:30 AM Medical Record Number: [id number] Patient Account Number: [id number] Date of Birth/Sex: [birth year] (88 y.o. F) Treating RN: Cornell Barman Primary Care Provider: Otilio Miu Other Clinician: Referring Provider: Otilio Miu Treating Provider/Extender: Skipper Cliche in Treatment: 14 History of Present Illness HPI Description: 03-04-2022 upon evaluation today patient appears to be doing poorly in regard to her gluteal region. Its mainly in the left gluteal region where there is an open ulceration at this point. This does appear to be a stage III pressure ulcer. Fortunately there does not appear to be any evidence of active infection at this site locally nor systemically which is also news. The patient has been counseled by her daughter who is present with her today her son was also on the phone we had a good discussion at this point. Nonetheless they have been trying to get her to not sit as long as they knew that was the major issue the patient tells me however she has a hard time getting up because of her knee. She has had of extensive work-up and treatment  for the knee and fortunately has been determined this is just arthritis and there is really not much more that they can do. Nonetheless I think she is going to have to get up and move around or at least get up and stand and allow for blood flow into the gluteal area or out she is can end up with more significant wounds than what we have and see right now. Patient does have a history of diabetes mellitus type 2, hypertension, and coronary artery disease. This wound has been present for at this point roughly 1 month beginning on or around February 04, 2022. 03-21-2022 upon evaluation today patient's wound actually showing signs of excellent improvement I am very pleased with where we stand today there does not appear to be any signs of infection which is great news and overall I think we are headed in the right direction. This is not terribly smaller but it is also looking better than what it was as far as the health of the surface of the wound is concerned. 04-15-2022 upon evaluation today patient appears to be doing well in regard to 1 side of her gluteal area not as well and the other that was healed last time. The right side is reopened which was previously closed. The left side is not as bad and in fact is very close to complete closure. Fortunately I do not see any evidence of active infection locally or systemically at this time which is great news. Overall I think that we are headed in the right direction but still regular probably need to put something on both sides to protect at this point and try to get these areas healed. 04-29-2022 upon evaluation today patient appears to be doing well with regard to her wounds both are shown signs of improvement  and very pleased in that regard. I do not see evidence of infection locally or systemically currently. 05-13-2022 upon evaluation today patient's wounds are showing signs of some improvement though she does have some need for sharp debridement today as  well. I did have a pretty lengthy discussion with her about what causes these wounds and how to prevent them. She voiced understanding I know she has a hard time standing up and moving around but even if she can just stand at the side of her bed this would be something. She voiced understanding. 05-27-2022 upon evaluation today patient appears to be doing okay in regard to her wound. Fortunately there does not appear to be any signs of active infection locally or systemically at this time which is great news. No fevers, chills, nausea, vomiting, or diarrhea. With that being said both wounds are still open but unfortunately she is having a lot of issues with discomfort and the dressing just are not staying in place. Fortunately I do not see again in the significant drainage which is good news. 06-10-2022 upon evaluation today patient appears to be doing better in regard to her wounds. They have been using the Desitin and they do feel like this is doing quite well. Fortunately I do not see any evidence of active infection locally or systemically at this time. No fevers, chills, nausea, vomiting, or diarrhea. Electronic Signature(s) Signed: 06/10/2022 12:01:06 PM By: Worthy Keeler PA-C Entered By: Worthy Keeler on 06/10/2022 12:01:06 [name] ([id number]) -------------------------------------------------------------------------------- Physical Exam Details Patient Name: [name] Date of Service: 06/10/2022 11:30 AM Medical Record Number: [id number] Patient Account Number: [id number] Date of Birth/Sex: 1932-02-26 (88 y.o. F) Treating RN: Cornell Barman Primary Care Provider: Otilio Miu Other Clinician: Referring Provider: Otilio Miu Treating Provider/Extender: Jeri Cos Weeks in Treatment: 50 Constitutional Well-nourished and well-hydrated in no acute distress. Respiratory normal breathing without difficulty. Psychiatric this patient is able to make decisions and  demonstrates good insight into disease process. Alert and Oriented x 3. pleasant and cooperative. Notes Upon inspection patient's wound bed actually showed signs of good granulation and epithelization at this point. Fortunately I do not see any evidence of active infection at this time which is great news and overall I am extremely pleased with where things stand. Electronic Signature(s) Signed: 06/10/2022 12:01:22 PM By: Worthy Keeler PA-C Entered By: Worthy Keeler on 06/10/2022 12:01:22 [name] ([id number]) -------------------------------------------------------------------------------- Physician Orders Details Patient Name: [name] Date of Service: 06/10/2022 11:30 AM Medical Record Number: [id number] Patient Account Number: [id number] Date of Birth/Sex: [birth year] (88 y.o. F) Treating RN: Cornell Barman Primary Care Provider: Otilio Miu Other Clinician: Referring Provider: Otilio Miu Treating Provider/Extender: Skipper Cliche in Treatment: 14 Verbal / Phone Orders: No Diagnosis Coding ICD-10 Coding Code Description [phone] Pressure ulcer of left buttock, stage 3 L89.313 Pressure ulcer of right buttock, stage 3 E11.622 Type 2 diabetes mellitus with other skin ulcer I10 Essential (primary) hypertension I25.10 Atherosclerotic heart disease of native coronary artery without angina pectoris Follow-up Appointments o Return Appointment in 2 weeks. Bathing/ Shower/ Hygiene o Wash wounds with antibacterial soap and water. - change dressing after shower o No tub bath. Anesthetic (Use 'Patient Medications' Section for Anesthetic Order Entry) o Lidocaine applied to wound bed Edema Control - Lymphedema / Segmental Compressive Device / Other o DO YOUR BEST to sleep in the bed at night. DO NOT sleep in your recliner. Long hours of sitting in a recliner leads to  swelling of the legs and/or potential wounds on your backside. - Try to sleep in the  bed Off-Loading o Gel wheelchair cushion o Turn and reposition every 2 hours Additional Orders / Instructions o Follow Nutritious Diet and Increase Protein Intake - monitor blood sugar also o Other: - calmoseptine recommended to use on wounds Wound Treatment Wound #2 - Gluteus Wound Laterality: Right Cleanser: Byram Ancillary Kit - 15 Day Supply (Generic) 1 x Per Day/15 Days Discharge Instructions: Use supplies as instructed; Kit contains: (15) Saline Bullets; (15) 3x3 Gauze; 15 pr Gloves Cleanser: Soap and Water 1 x Per Day/15 Days Discharge Instructions: Gently cleanse wound with antibacterial soap, rinse and pat dry prior to dressing wounds Cleanser: Wound Cleanser 1 x Per Day/15 Days Discharge Instructions: Wash your hands with soap and water. Remove old dressing, discard into plastic bag and place into trash. Cleanse the wound with Wound Cleanser prior to applying a clean dressing using gauze sponges, not tissues or cotton balls. Do not scrub or use excessive force. Pat dry using gauze sponges, not tissue or cotton balls. Topical: Desitin Maximum Strength Ointment, 1 (oz) tube 1 x Per Day/15 Days Electronic Signature(s) Signed: 06/10/2022 12:58:04 PM By: Gretta Cool, BSN, RN, CWS, Kim RN, BSN Signed: 06/10/2022 4:22:23 PM By: Worthy Keeler PA-C Entered By: Gretta Cool, BSN, RN, CWS, Kim on 06/10/2022 12:06:38 [name] ([id number]) -------------------------------------------------------------------------------- Problem List Details Patient Name: [name] Date of Service: 06/10/2022 11:30 AM Medical Record Number: [id number] Patient Account Number: [id number] Date of Birth/Sex: 1932/09/19 (88 y.o. F) Treating RN: Cornell Barman Primary Care Provider: Otilio Miu Other Clinician: Referring Provider: Otilio Miu Treating Provider/Extender: Jeri Cos Weeks in Treatment: 14 Active Problems ICD-10 Encounter Code Description Active Date MDM Diagnosis [phone] Pressure  ulcer of left buttock, stage 3 03/04/2022 No Yes L89.313 Pressure ulcer of right buttock, stage 3 04/15/2022 No Yes E11.622 Type 2 diabetes mellitus with other skin ulcer 03/04/2022 No Yes I10 Essential (primary) hypertension 03/04/2022 No Yes I25.10 Atherosclerotic heart disease of native coronary artery without angina 03/04/2022 No Yes pectoris Inactive Problems Resolved Problems Electronic Signature(s) Signed: 06/10/2022 11:40:21 AM By: Worthy Keeler PA-C Entered By: Worthy Keeler on 06/10/2022 11:40:21 [name] ([id number]) -------------------------------------------------------------------------------- Progress Note Details Patient Name: [name] Date of Service: 06/10/2022 11:30 AM Medical Record Number: [id number] Patient Account Number: [id number] Date of Birth/Sex: Jan 28, 1932 (88 y.o. F) Treating RN: Cornell Barman Primary Care Provider: Otilio Miu Other Clinician: Referring Provider: Otilio Miu Treating Provider/Extender: Skipper Cliche in Treatment: 14 Subjective Chief Complaint Information obtained from Patient Bilateral buttock ulcers History of Present Illness (HPI) 03-04-2022 upon evaluation today patient appears to be doing poorly in regard to her gluteal region. Its mainly in the left gluteal region where there is an open ulceration at this point. This does appear to be a stage III pressure ulcer. Fortunately there does not appear to be any evidence of active infection at this site locally nor systemically which is also news. The patient has been counseled by her daughter who is present with her today her son was also on the phone we had a good discussion at this point. Nonetheless they have been trying to get her to not sit as long as they knew that was the major issue the patient tells me however she has a hard time getting up because of her knee. She has had of extensive work-up and treatment for the knee and fortunately has been determined this is  just  arthritis and there is really not much more that they can do. Nonetheless I think she is going to have to get up and move around or at least get up and stand and allow for blood flow into the gluteal area or out she is can end up with more significant wounds than what we have and see right now. Patient does have a history of diabetes mellitus type 2, hypertension, and coronary artery disease. This wound has been present for at this point roughly 1 month beginning on or around February 04, 2022. 03-21-2022 upon evaluation today patient's wound actually showing signs of excellent improvement I am very pleased with where we stand today there does not appear to be any signs of infection which is great news and overall I think we are headed in the right direction. This is not terribly smaller but it is also looking better than what it was as far as the health of the surface of the wound is concerned. 04-15-2022 upon evaluation today patient appears to be doing well in regard to 1 side of her gluteal area not as well and the other that was healed last time. The right side is reopened which was previously closed. The left side is not as bad and in fact is very close to complete closure. Fortunately I do not see any evidence of active infection locally or systemically at this time which is great news. Overall I think that we are headed in the right direction but still regular probably need to put something on both sides to protect at this point and try to get these areas healed. 04-29-2022 upon evaluation today patient appears to be doing well with regard to her wounds both are shown signs of improvement and very pleased in that regard. I do not see evidence of infection locally or systemically currently. 05-13-2022 upon evaluation today patient's wounds are showing signs of some improvement though she does have some need for sharp debridement today as well. I did have a pretty lengthy discussion with her about  what causes these wounds and how to prevent them. She voiced understanding I know she has a hard time standing up and moving around but even if she can just stand at the side of her bed this would be something. She voiced understanding. 05-27-2022 upon evaluation today patient appears to be doing okay in regard to her wound. Fortunately there does not appear to be any signs of active infection locally or systemically at this time which is great news. No fevers, chills, nausea, vomiting, or diarrhea. With that being said both wounds are still open but unfortunately she is having a lot of issues with discomfort and the dressing just are not staying in place. Fortunately I do not see again in the significant drainage which is good news. 06-10-2022 upon evaluation today patient appears to be doing better in regard to her wounds. They have been using the Desitin and they do feel like this is doing quite well. Fortunately I do not see any evidence of active infection locally or systemically at this time. No fevers, chills, nausea, vomiting, or diarrhea. Objective Constitutional Well-nourished and well-hydrated in no acute distress. Vitals Time Taken: 11:48 AM, Height: 63 in, Weight: 174 lbs, BMI: 30.8, Temperature: 98.3 F, Pulse: 78 bpm, Respiratory Rate: 18 breaths/min, Blood Pressure: 135/73 mmHg. Respiratory normal breathing without difficulty. JONTAE, SONIER ([id number]) Psychiatric this patient is able to make decisions and demonstrates good insight into disease process. Alert and Oriented x  3. pleasant and cooperative. General Notes: Upon inspection patient's wound bed actually showed signs of good granulation and epithelization at this point. Fortunately I do not see any evidence of active infection at this time which is great news and overall I am extremely pleased with where things stand. Integumentary (Hair, Skin) Wound #1 status is Healed - Epithelialized. Original cause of wound was  Gradually Appeared. The date acquired was: 02/04/2022. The wound has been in treatment 14 weeks. The wound is located on the Left Gluteus. The wound measures 0cm length x 0cm width x 0cm depth; 0cm^2 area and 0cm^3 volume. There is a medium amount of serosanguineous drainage noted. Wound #2 status is Open. Original cause of wound was Gradually Appeared. The date acquired was: 04/07/2022. The wound has been in treatment 8 weeks. The wound is located on the Right Gluteus. The wound measures 0.1cm length x 0.1cm width x 0.1cm depth; 0.008cm^2 area and 0.001cm^3 volume. There is Fat Layer (Subcutaneous Tissue) exposed. There is a medium amount of serosanguineous drainage noted. There is large (67-100%) red, pink granulation within the wound bed. There is no necrotic tissue within the wound bed. Assessment Active Problems ICD-10 Pressure ulcer of left buttock, stage 3 Pressure ulcer of right buttock, stage 3 Type 2 diabetes mellitus with other skin ulcer Essential (primary) hypertension Atherosclerotic heart disease of native coronary artery without angina pectoris Plan 1. I would recommend that we going continue with the wound care measures as before and the patient is in agreement with plan this includes the use of the Desitin which I think is doing a good job here. If it gets too dry they know they can switch over to the AandD ointment. 2. Also can recommend that we have the patient continue with appropriate offloading I think this is still to be of utmost importance. We will see patient back for reevaluation in 2 weeks here in the clinic. If anything worsens or changes patient will contact our office for additional recommendations. Electronic Signature(s) Signed: 06/10/2022 12:01:48 PM By: Worthy Keeler PA-C Entered By: Worthy Keeler on 06/10/2022 12:01:48 [name] ([id number]) -------------------------------------------------------------------------------- SuperBill  Details Patient Name: [name] Date of Service: 06/10/2022 Medical Record Number: [id number] Patient Account Number: [id number] Date of Birth/Sex: [birth year] (88 y.o. F) Treating RN: Cornell Barman Primary Care Provider: Otilio Miu Other Clinician: Referring Provider: Otilio Miu Treating Provider/Extender: Jeri Cos Weeks in Treatment: 14 Diagnosis Coding ICD-10 Codes Code Description [phone] Pressure ulcer of left buttock, stage 3 L89.313 Pressure ulcer of right buttock, stage 3 E11.622 Type 2 diabetes mellitus with other skin ulcer I10 Essential (primary) hypertension I25.10 Atherosclerotic heart disease of native coronary artery without angina pectoris Physician Procedures CPT4 Code: [id number] Description: 99213 - WC PHYS LEVEL 3 - EST PT Modifier: Quantity: 1 CPT4 Code: Description: ICD-10 Diagnosis Description L89.323 Pressure ulcer of left buttock, stage 3 L89.313 Pressure ulcer of right buttock, stage 3 E11.622 Type 2 diabetes mellitus with other skin ulcer I10 Essential (primary) hypertension Modifier: Quantity: Electronic Signature(s) Signed: 06/10/2022 12:02:58 PM By: Worthy Keeler PA-C Entered By: Worthy Keeler on 06/10/2022 12:02:58

## 2022-06-13 ENCOUNTER — Other Ambulatory Visit: Payer: Self-pay | Admitting: Family Medicine

## 2022-06-13 DIAGNOSIS — E119 Type 2 diabetes mellitus without complications: Secondary | ICD-10-CM

## 2022-06-16 ENCOUNTER — Telehealth: Payer: Self-pay

## 2022-06-16 MED ORDER — METOPROLOL TARTRATE 25 MG PO TABS: 12.5000 mg | ORAL_TABLET | Freq: Two times a day (BID) | ORAL | 5 refills | Status: DC

## 2022-06-16 NOTE — Telephone Encounter (Signed)
Patient and pt daughter [name] made aware of monitor results and Cadence Furth, [place] recommendation with verbalized understanding.

## 2022-06-16 NOTE — Telephone Encounter (Signed)
-----   Message from Cadence David Stall, PA-C sent at 06/15/2022  1:47 PM EDT ----- Heart monitor showed normal rhythm with occasional PACs and PVCs with Svt (fast heart rates. IT looks like patient has EP visit in September. Lets stop HCTZ and Benicar and start Lopressor 12.5mg  BID

## 2022-06-24 ENCOUNTER — Ambulatory Visit: Payer: Medicare Other | Admitting: Physician Assistant

## 2022-06-30 DIAGNOSIS — M179 Osteoarthritis of knee, unspecified: Secondary | ICD-10-CM | POA: Diagnosis not present

## 2022-07-08 ENCOUNTER — Encounter: Payer: Medicare Other | Attending: Physician Assistant | Admitting: Physician Assistant

## 2022-07-08 DIAGNOSIS — L89323 Pressure ulcer of left buttock, stage 3: Secondary | ICD-10-CM | POA: Insufficient documentation

## 2022-07-08 DIAGNOSIS — L89313 Pressure ulcer of right buttock, stage 3: Secondary | ICD-10-CM | POA: Insufficient documentation

## 2022-07-08 DIAGNOSIS — I2510 Atherosclerotic heart disease of native coronary artery without angina pectoris: Secondary | ICD-10-CM | POA: Diagnosis not present

## 2022-07-08 DIAGNOSIS — E11622 Type 2 diabetes mellitus with other skin ulcer: Secondary | ICD-10-CM | POA: Diagnosis not present

## 2022-07-08 DIAGNOSIS — I10 Essential (primary) hypertension: Secondary | ICD-10-CM | POA: Diagnosis not present

## 2022-07-08 DIAGNOSIS — L98418 Non-pressure chronic ulcer of buttock with other specified severity: Secondary | ICD-10-CM | POA: Diagnosis not present

## 2022-07-09 NOTE — Progress Notes (Addendum)
DELYNDA, Reynolds ([id number]) Visit Report for 07/08/2022 Chief Complaint Document Details Patient Name: [name], [name] Date of Service: 07/08/2022 11:15 AM Medical Record Number: [id number] Patient Account Number: [id number] Date of Birth/Sex: 04/11/1932 (88 y.o. F) Treating RN: Cornell Barman Primary Care Provider: Otilio Miu Other Clinician: Referring Provider: Otilio Miu Treating Provider/Extender: Skipper Cliche in Treatment: 18 Information Obtained from: Patient Chief Complaint Bilateral buttock ulcers Electronic Signature(s) Signed: 07/08/2022 10:52:22 AM By: Worthy Keeler PA-C Entered By: Worthy Keeler on 07/08/2022 10:52:22 AZION, JALBERT ([id number]) -------------------------------------------------------------------------------- HPI Details Patient Name: [name] Date of Service: 07/08/2022 11:15 AM Medical Record Number: [id number] Patient Account Number: [id number] Date of Birth/Sex: 1932/10/02 (88 y.o. F) Treating RN: Cornell Barman Primary Care Provider: Otilio Miu Other Clinician: Referring Provider: Otilio Miu Treating Provider/Extender: Skipper Cliche in Treatment: 18 History of Present Illness HPI Description: 03-04-2022 upon evaluation today patient appears to be doing poorly in regard to her gluteal region. Its mainly in the left gluteal region where there is an open ulceration at this point. This does appear to be a stage III pressure ulcer. Fortunately there does not appear to be any evidence of active infection at this site locally nor systemically which is also news. The patient has been counseled by her daughter who is present with her today her son was also on the phone we had a good discussion at this point. Nonetheless they have been trying to get her to not sit as long as they knew that was the major issue the patient tells me however she has a hard time getting up because of her knee. She has had of extensive work-up and treatment  for the knee and fortunately has been determined this is just arthritis and there is really not much more that they can do. Nonetheless I think she is going to have to get up and move around or at least get up and stand and allow for blood flow into the gluteal area or out she is can end up with more significant wounds than what we have and see right now. Patient does have a history of diabetes mellitus type 2, hypertension, and coronary artery disease. This wound has been present for at this point roughly 1 month beginning on or around February 04, 2022. 03-21-2022 upon evaluation today patient's wound actually showing signs of excellent improvement I am very pleased with where we stand today there does not appear to be any signs of infection which is great news and overall I think we are headed in the right direction. This is not terribly smaller but it is also looking better than what it was as far as the health of the surface of the wound is concerned. 04-15-2022 upon evaluation today patient appears to be doing well in regard to 1 side of her gluteal area not as well and the other that was healed last time. The right side is reopened which was previously closed. The left side is not as bad and in fact is very close to complete closure. Fortunately I do not see any evidence of active infection locally or systemically at this time which is great news. Overall I think that we are headed in the right direction but still regular probably need to put something on both sides to protect at this point and try to get these areas healed. 04-29-2022 upon evaluation today patient appears to be doing well with regard to her wounds both are shown signs of improvement  and very pleased in that regard. I do not see evidence of infection locally or systemically currently. 05-13-2022 upon evaluation today patient's wounds are showing signs of some improvement though she does have some need for sharp debridement today as  well. I did have a pretty lengthy discussion with her about what causes these wounds and how to prevent them. She voiced understanding I know she has a hard time standing up and moving around but even if she can just stand at the side of her bed this would be something. She voiced understanding. 05-27-2022 upon evaluation today patient appears to be doing okay in regard to her wound. Fortunately there does not appear to be any signs of active infection locally or systemically at this time which is great news. No fevers, chills, nausea, vomiting, or diarrhea. With that being said both wounds are still open but unfortunately she is having a lot of issues with discomfort and the dressing just are not staying in place. Fortunately I do not see again in the significant drainage which is good news. 06-10-2022 upon evaluation today patient appears to be doing better in regard to her wounds. They have been using the Desitin and they do feel like this is doing quite well. Fortunately I do not see any evidence of active infection locally or systemically at this time. No fevers, chills, nausea, vomiting, or diarrhea. 07-08-2022 upon evaluation today patient appears to be doing well currently in regard to her wound in the gluteal region. In fact this appears to be completely healed based on what I am seeing in. Fortunately I do not see any signs of active infection at this time which is great news. Electronic Signature(s) Signed: 07/08/2022 11:40:45 AM By: Worthy Keeler PA-C Entered By: Worthy Keeler on 07/08/2022 11:40:45 [name] ([id number]) -------------------------------------------------------------------------------- Physical Exam Details Patient Name: [name] Date of Service: 07/08/2022 11:15 AM Medical Record Number: [id number] Patient Account Number: [id number] Date of Birth/Sex: 12-27-1931 (88 y.o. F) Treating RN: Cornell Barman Primary Care Provider: Otilio Miu Other  Clinician: Referring Provider: Otilio Miu Treating Provider/Extender: Jeri Cos Weeks in Treatment: 44 Constitutional Well-nourished and well-hydrated in no acute distress. Respiratory normal breathing without difficulty. Psychiatric this patient is able to make decisions and demonstrates good insight into disease process. Alert and Oriented x 3. pleasant and cooperative. Notes Upon inspection patient's wound bed actually showed signs of good granulation and epithelization at this point. Fortunately I do not see any evidence of active infection at this point which is great news and overall I am extremely pleased with where we stand today. Electronic Signature(s) Signed: 07/08/2022 11:41:01 AM By: Worthy Keeler PA-C Entered By: Worthy Keeler on 07/08/2022 11:41:00 [name] ([id number]) -------------------------------------------------------------------------------- Physician Orders Details Patient Name: [name] Date of Service: 07/08/2022 11:15 AM Medical Record Number: [id number] Patient Account Number: [id number] Date of Birth/Sex: 1931/12/16 (88 y.o. F) Treating RN: Cornell Barman Primary Care Provider: Otilio Miu Other Clinician: Referring Provider: Otilio Miu Treating Provider/Extender: Skipper Cliche in Treatment: 18 Verbal / Phone Orders: No Diagnosis Coding ICD-10 Coding Code Description [phone] Pressure ulcer of left buttock, stage 3 L89.313 Pressure ulcer of right buttock, stage 3 E11.622 Type 2 diabetes mellitus with other skin ulcer I10 Essential (primary) hypertension I25.10 Atherosclerotic heart disease of native coronary artery without angina pectoris Discharge From [facility] Wound #2 Right Gluteus o Discharge from [facility] Complete - Off load every hour. Stand up and get some  blood flowing. Wound Treatment Electronic Signature(s) Signed: 07/08/2022 12:34:53 PM By: Lenda Kelp PA-C Signed: 07/08/2022  1:20:47 PM By: Elliot Gurney, BSN, RN, CWS, Kim RN, BSN Entered By: Elliot Gurney, BSN, RN, CWS, Kim on 07/08/2022 11:38:41 EVELETTE, HOLLERN ([id number]) -------------------------------------------------------------------------------- Problem List Details Patient Name: [name] Date of Service: 07/08/2022 11:15 AM Medical Record Number: [id number] Patient Account Number: [id number] Date of Birth/Sex: [birth year] (88 y.o. F) Treating RN: Huel Coventry Primary Care Provider: Elizabeth Sauer Other Clinician: Referring Provider: Elizabeth Sauer Treating Provider/Extender: Allen Derry Weeks in Treatment: 18 Active Problems ICD-10 Encounter Code Description Active Date MDM Diagnosis [id number] Pressure ulcer of left buttock, stage 3 03/04/2022 No Yes L89.313 Pressure ulcer of right buttock, stage 3 04/15/2022 No Yes E11.622 Type 2 diabetes mellitus with other skin ulcer 03/04/2022 No Yes I10 Essential (primary) hypertension 03/04/2022 No Yes I25.10 Atherosclerotic heart disease of native coronary artery without angina 03/04/2022 No Yes pectoris Inactive Problems Resolved Problems Electronic Signature(s) Signed: 07/08/2022 10:52:16 AM By: Lenda Kelp PA-C Entered By: Lenda Kelp on 07/08/2022 10:52:16 [name] ([id number]) -------------------------------------------------------------------------------- Progress Note Details Patient Name: [name] Date of Service: 07/08/2022 11:15 AM Medical Record Number: [id number] Patient Account Number: [id number] Date of Birth/Sex: July 20, 1932 (88 y.o. F) Treating RN: Huel Coventry Primary Care Provider: Elizabeth Sauer Other Clinician: Referring Provider: Elizabeth Sauer Treating Provider/Extender: Rowan Blase in Treatment: 18 Subjective Chief Complaint Information obtained from Patient Bilateral buttock ulcers History of Present Illness (HPI) 03-04-2022 upon evaluation today patient appears to be doing poorly in regard to her gluteal region. Its  mainly in the left gluteal region where there is an open ulceration at this point. This does appear to be a stage III pressure ulcer. Fortunately there does not appear to be any evidence of active infection at this site locally nor systemically which is also news. The patient has been counseled by her daughter who is present with her today her son was also on the phone we had a good discussion at this point. Nonetheless they have been trying to get her to not sit as long as they knew that was the major issue the patient tells me however she has a hard time getting up because of her knee. She has had of extensive work-up and treatment for the knee and fortunately has been determined this is just arthritis and there is really not much more that they can do. Nonetheless I think she is going to have to get up and move around or at least get up and stand and allow for blood flow into the gluteal area or out she is can end up with more significant wounds than what we have and see right now. Patient does have a history of diabetes mellitus type 2, hypertension, and coronary artery disease. This wound has been present for at this point roughly 1 month beginning on or around February 04, 2022. 03-21-2022 upon evaluation today patient's wound actually showing signs of excellent improvement I am very pleased with where we stand today there does not appear to be any signs of infection which is great news and overall I think we are headed in the right direction. This is not terribly smaller but it is also looking better than what it was as far as the health of the surface of the wound is concerned. 04-15-2022 upon evaluation today patient appears to be doing well in regard to 1 side of her gluteal area not as well and the other that  was healed last time. The right side is reopened which was previously closed. The left side is not as bad and in fact is very close to complete closure. Fortunately I do not see any evidence  of active infection locally or systemically at this time which is great news. Overall I think that we are headed in the right direction but still regular probably need to put something on both sides to protect at this point and try to get these areas healed. 04-29-2022 upon evaluation today patient appears to be doing well with regard to her wounds both are shown signs of improvement and very pleased in that regard. I do not see evidence of infection locally or systemically currently. 05-13-2022 upon evaluation today patient's wounds are showing signs of some improvement though she does have some need for sharp debridement today as well. I did have a pretty lengthy discussion with her about what causes these wounds and how to prevent them. She voiced understanding I know she has a hard time standing up and moving around but even if she can just stand at the side of her bed this would be something. She voiced understanding. 05-27-2022 upon evaluation today patient appears to be doing okay in regard to her wound. Fortunately there does not appear to be any signs of active infection locally or systemically at this time which is great news. No fevers, chills, nausea, vomiting, or diarrhea. With that being said both wounds are still open but unfortunately she is having a lot of issues with discomfort and the dressing just are not staying in place. Fortunately I do not see again in the significant drainage which is good news. 06-10-2022 upon evaluation today patient appears to be doing better in regard to her wounds. They have been using the Desitin and they do feel like this is doing quite well. Fortunately I do not see any evidence of active infection locally or systemically at this time. No fevers, chills, nausea, vomiting, or diarrhea. 07-08-2022 upon evaluation today patient appears to be doing well currently in regard to her wound in the gluteal region. In fact this appears to be completely healed based on  what I am seeing in. Fortunately I do not see any signs of active infection at this time which is great news. Objective Constitutional Well-nourished and well-hydrated in no acute distress. Vitals Time Taken: 11:25 AM, Height: 63 in, Weight: 174 lbs, BMI: 30.8, Temperature: 98.2 F, Pulse: 97 bpm, Respiratory Rate: 18 breaths/min, Blood Pressure: 157/80 mmHg. DARYAN, CAGLEY ([id number]) Respiratory normal breathing without difficulty. Psychiatric this patient is able to make decisions and demonstrates good insight into disease process. Alert and Oriented x 3. pleasant and cooperative. General Notes: Upon inspection patient's wound bed actually showed signs of good granulation and epithelization at this point. Fortunately I do not see any evidence of active infection at this point which is great news and overall I am extremely pleased with where we stand today. Integumentary (Hair, Skin) Wound #2 status is Open. Original cause of wound was Gradually Appeared. The date acquired was: 04/07/2022. The wound has been in treatment 12 weeks. The wound is located on the Right Gluteus. The wound measures 0.1cm length x 0.1cm width x 0.1cm depth; 0.008cm^2 area and 0.001cm^3 volume. There is no tunneling or undermining noted. There is a none present amount of drainage noted. There is no granulation within the wound bed. There is no necrotic tissue within the wound bed. Assessment Active Problems ICD-10 Pressure ulcer of  left buttock, stage 3 Pressure ulcer of right buttock, stage 3 Type 2 diabetes mellitus with other skin ulcer Essential (primary) hypertension Atherosclerotic heart disease of native coronary artery without angina pectoris Plan Discharge From [facility]: Wound #2 Right Gluteus: Discharge from [facility] Complete - Off load every hour. Stand up and get some blood flowing. 1. I am going to recommend that we go ahead and continue with the wound care measures as  before and the patient is in agreement with the plan. This includes the use of the AandD ointment to the wound bed which I think even though it is healed this will still help keep it in good integrity. 2. I am also can recommend continued and appropriate offloading reinforced multiple times with the patient today and her family was present in order to make sure that she tries to stay off of this so does not end up reopening yet again on her. We will see patient back for reevaluation in 1 week here in the clinic. If anything worsens or changes patient will contact our office for additional recommendations. Electronic Signature(s) Signed: 07/08/2022 11:41:38 AM By: Worthy Keeler PA-C Entered By: Worthy Keeler on 07/08/2022 11:41:37 [name] ([id number]) -------------------------------------------------------------------------------- SuperBill Details Patient Name: [name] Date of Service: 07/08/2022 Medical Record Number: [id number] Patient Account Number: [id number] Date of Birth/Sex: 18-Aug-1932 (88 y.o. F) Treating RN: Cornell Barman Primary Care Provider: Otilio Miu Other Clinician: Referring Provider: Otilio Miu Treating Provider/Extender: Jeri Cos Weeks in Treatment: 18 Diagnosis Coding ICD-10 Codes Code Description [phone] Pressure ulcer of left buttock, stage 3 L89.313 Pressure ulcer of right buttock, stage 3 E11.622 Type 2 diabetes mellitus with other skin ulcer I10 Essential (primary) hypertension I25.10 Atherosclerotic heart disease of native coronary artery without angina pectoris Physician Procedures CPT4 Code: [id number] Description: 99213 - WC PHYS LEVEL 3 - EST PT Modifier: Quantity: 1 CPT4 Code: Description: ICD-10 Diagnosis Description L89.323 Pressure ulcer of left buttock, stage 3 L89.313 Pressure ulcer of right buttock, stage 3 E11.622 Type 2 diabetes mellitus with other skin ulcer I10 Essential (primary)  hypertension Modifier: Quantity: Electronic Signature(s) Signed: 07/08/2022 11:41:52 AM By: Worthy Keeler PA-C Entered By: Worthy Keeler on 07/08/2022 11:41:52

## 2022-07-09 NOTE — Progress Notes (Addendum)
[name], [name] ([id number]) Visit Report for 07/08/2022 Arrival Information Details Patient Name: [name] Date of Service: 07/08/2022 11:15 AM Medical Record Number: [id number] Patient Account Number: [id number] Date of Birth/Sex: [birth year] (88 y.o. Female) Treating RN: Cornell Barman Primary Care Miquan Tandon: Otilio Miu Other Clinician: Referring Adewale Pucillo: Otilio Miu Treating Treyvion Durkee/Extender: Skipper Cliche in Treatment: 18 Visit Information History Since Last Visit Added or deleted any medications: No Patient Arrived: Wheel Chair Has Dressing in Place as Prescribed: Yes Arrival Time: 11:19 Pain Present Now: No Accompanied By: self Transfer Assistance: Manual Patient Identification Verified: Yes Secondary Verification Process Completed: Yes Patient Requires Transmission-Based Precautions: No Patient Has Alerts: Yes Patient Alerts: DIABETIC Electronic Signature(s) Signed: 07/08/2022 1:20:47 PM By: Gretta Cool, BSN, RN, CWS, Kim RN, BSN Entered By: Gretta Cool, BSN, RN, CWS, Kim on 07/08/2022 11:25:43 [name] ([id number]) -------------------------------------------------------------------------------- Clinic Level of Care Assessment Details Patient Name: [name] Date of Service: 07/08/2022 11:15 AM Medical Record Number: [id number] Patient Account Number: [id number] Date of Birth/Sex: 09/11/1932 (88 y.o. Female) Treating RN: Cornell Barman Primary Care Montine Hight: Otilio Miu Other Clinician: Referring Drake Landing: Otilio Miu Treating Roshni Burbano/Extender: Skipper Cliche in Treatment: 18 Clinic Level of Care Assessment Items TOOL 4 Quantity Score []  - Use when only an EandM is performed on FOLLOW-UP visit 0 ASSESSMENTS - Nursing Assessment / Reassessment X - Reassessment of Co-morbidities (includes updates in patient status) 1 10 X- 1 5 Reassessment of Adherence to Treatment Plan ASSESSMENTS - Wound and Skin Assessment / Reassessment X - Simple Wound  Assessment / Reassessment - one wound 1 5 []  - 0 Complex Wound Assessment / Reassessment - multiple wounds []  - 0 Dermatologic / Skin Assessment (not related to wound area) ASSESSMENTS - Focused Assessment []  - Circumferential Edema Measurements - multi extremities 0 []  - 0 Nutritional Assessment / Counseling / Intervention []  - 0 Lower Extremity Assessment (monofilament, tuning fork, pulses) []  - 0 Peripheral Arterial Disease Assessment (using hand held doppler) ASSESSMENTS - Ostomy and/or Continence Assessment and Care []  - Incontinence Assessment and Management 0 []  - 0 Ostomy Care Assessment and Management (repouching, etc.) PROCESS - Coordination of Care []  - Simple Patient / Family Education for ongoing care 0 X- 1 20 Complex (extensive) Patient / Family Education for ongoing care []  - 0 Staff obtains Programmer, systems, Records, Test Results / Process Orders []  - 0 Staff telephones HHA, Nursing Homes / Clarify orders / etc []  - 0 Routine Transfer to another Facility (non-emergent condition) []  - 0 Routine Hospital Admission (non-emergent condition) []  - 0 New Admissions / Biomedical engineer / Ordering NPWT, Apligraf, etc. []  - 0 Emergency Hospital Admission (emergent condition) X- 1 10 Simple Discharge Coordination []  - 0 Complex (extensive) Discharge Coordination PROCESS - Special Needs []  - Pediatric / Minor Patient Management 0 []  - 0 Isolation Patient Management []  - 0 Hearing / Language / Visual special needs []  - 0 Assessment of Community assistance (transportation, D/C planning, etc.) []  - 0 Additional assistance / Altered mentation []  - 0 Support Surface(s) Assessment (bed, cushion, seat, etc.) INTERVENTIONS - Wound Cleansing / Measurement [name], LISI. ([id number]) []  - 0 Simple Wound Cleansing - one wound []  - 0 Complex Wound Cleansing - multiple wounds []  - 0 Wound Imaging (photographs - any number of wounds) []  - 0 Wound Tracing (instead of  photographs) []  - 0 Simple Wound Measurement - one wound []  - 0 Complex Wound Measurement - multiple wounds INTERVENTIONS - Wound Dressings []  - Small Wound Dressing  one or multiple wounds 0 []  - 0 Medium Wound Dressing one or multiple wounds []  - 0 Large Wound Dressing one or multiple wounds []  - 0 Application of Medications - topical []  - 0 Application of Medications - injection INTERVENTIONS - Miscellaneous []  - External ear exam 0 []  - 0 Specimen Collection (cultures, biopsies, blood, body fluids, etc.) []  - 0 Specimen(s) / Culture(s) sent or taken to Lab for analysis []  - 0 Patient Transfer (multiple staff / Civil Service fast streamer / Similar devices) []  - 0 Simple Staple / Suture removal (25 or less) []  - 0 Complex Staple / Suture removal (26 or more) []  - 0 Hypo / Hyperglycemic Management (close monitor of Blood Glucose) []  - 0 Ankle / Brachial Index (ABI) - do not check if billed separately X- 1 5 Vital Signs Has the patient been seen at the hospital within the last three years: Yes Total Score: 55 Level Of Care: New/Established - Level 2 Electronic Signature(s) Signed: 07/08/2022 1:20:47 PM By: Gretta Cool, BSN, RN, CWS, Kim RN, BSN Entered By: Gretta Cool, BSN, RN, CWS, Kim on 07/08/2022 12:29:46 [name] ([id number]) -------------------------------------------------------------------------------- Encounter Discharge Information Details Patient Name: [name] Date of Service: 07/08/2022 11:15 AM Medical Record Number: [id number] Patient Account Number: [id number] Date of Birth/Sex: 1932/01/09 (88 y.o. Female) Treating RN: Cornell Barman Primary Care Jordynn Marcella: Otilio Miu Other Clinician: Referring Chevez Sambrano: Otilio Miu Treating Narelle Schoening/Extender: Skipper Cliche in Treatment: 18 Encounter Discharge Information Items Discharge Condition: Stable Ambulatory Status: Wheelchair Discharge Destination: Home Transportation: Private Auto Schedule Follow-up Appointment:  Yes Clinical Summary of Care: Electronic Signature(s) Signed: 07/08/2022 1:20:47 PM By: Gretta Cool, BSN, RN, CWS, Kim RN, BSN Entered By: Gretta Cool, BSN, RN, CWS, Kim on 07/08/2022 12:32:37 [name] ([id number]) -------------------------------------------------------------------------------- Lower Extremity Assessment Details Patient Name: [name] Date of Service: 07/08/2022 11:15 AM Medical Record Number: [id number] Patient Account Number: [id number] Date of Birth/Sex: 19-Oct-1932 (88 y.o. Female) Treating RN: Cornell Barman Primary Care Tripp Goins: Otilio Miu Other Clinician: Referring Roshad Hack: Otilio Miu Treating Pansy Ostrovsky/Extender: Skipper Cliche in Treatment: 18 Electronic Signature(s) Signed: 07/08/2022 1:20:47 PM By: Gretta Cool, BSN, RN, CWS, Kim RN, BSN Entered By: Gretta Cool, BSN, RN, CWS, Kim on 07/08/2022 11:36:06 [name] ([id number]) -------------------------------------------------------------------------------- Multi Wound Chart Details Patient Name: [name] Date of Service: 07/08/2022 11:15 AM Medical Record Number: [id number] Patient Account Number: [id number] Date of Birth/Sex: 22-Apr-1932 (88 y.o. Female) Treating RN: Cornell Barman Primary Care Mung Rinker: Otilio Miu Other Clinician: Referring Pammie Chirino: Otilio Miu Treating Desmon Hitchner/Extender: Skipper Cliche in Treatment: 18 Vital Signs Height(in): 63 Pulse(bpm): 97 Weight(lbs): 174 Blood Pressure(mmHg): 157/80 Body Mass Index(BMI): 30.8 Temperature(F): 98.2 Respiratory Rate(breaths/min): 18 Photos: [N/A:N/A] Wound Location: Right Gluteus N/A N/A Wounding Event: Gradually Appeared N/A N/A Primary Etiology: Pressure Ulcer N/A N/A Comorbid History: Arrhythmia, Coronary Artery N/A N/A Disease, Hypertension, Type II Diabetes, Osteoarthritis Date Acquired: 04/07/2022 N/A N/A Weeks of Treatment: 12 N/A N/A Wound Status: Open N/A N/A Wound Recurrence: No N/A N/A Measurements L x W x D (cm)  0.1x0.1x0.1 N/A N/A Area (cm) : 0.008 N/A N/A Volume (cm) : 0.001 N/A N/A % Reduction in Area: 98.40% N/A N/A % Reduction in Volume: 98.00% N/A N/A Classification: Category/Stage III N/A N/A Exudate Amount: None Present N/A N/A Granulation Amount: None Present (0%) N/A N/A Necrotic Amount: None Present (0%) N/A N/A Exposed Structures: Fascia: No N/A N/A Fat Layer (Subcutaneous Tissue): No Tendon: No Muscle: No Joint: No Bone: No Epithelialization: Large (67-100%) N/A N/A Treatment Notes Electronic Signature(s)  Signed: 07/08/2022 1:20:47 PM By: Elliot Gurney, BSN, RN, CWS, Kim RN, BSN Entered By: Elliot Gurney, BSN, RN, CWS, Kim on 07/08/2022 11:36:24 [name] ([id number]) -------------------------------------------------------------------------------- Multi-Disciplinary Care Plan Details Patient Name: [name] Date of Service: 07/08/2022 11:15 AM Medical Record Number: [id number] Patient Account Number: [id number] Date of Birth/Sex: 1932-10-01 (88 y.o. Female) Treating RN: Huel Coventry Primary Care Kenleigh Toback: Elizabeth Sauer Other Clinician: Referring Caylea Foronda: Elizabeth Sauer Treating Lerae Langham/Extender: Rowan Blase in Treatment: 18 Active Inactive Electronic Signature(s) Signed: 07/08/2022 1:20:47 PM By: Elliot Gurney, BSN, RN, CWS, Kim RN, BSN Entered By: Elliot Gurney, BSN, RN, CWS, Kim on 07/08/2022 11:36:17 [name] ([id number]) -------------------------------------------------------------------------------- Pain Assessment Details Patient Name: [name] Date of Service: 07/08/2022 11:15 AM Medical Record Number: [id number] Patient Account Number: [id number] Date of Birth/Sex: 1932/05/26 (88 y.o. Female) Treating RN: Huel Coventry Primary Care Krisa Blattner: Elizabeth Sauer Other Clinician: Referring Alannah Averhart: Elizabeth Sauer Treating Cornelious Diven/Extender: Rowan Blase in Treatment: 18 Active Problems Location of Pain Severity and Description of Pain Patient Has Paino  No Site Locations Pain Management and Medication Current Pain Management: Electronic Signature(s) Signed: 07/08/2022 1:20:47 PM By: Elliot Gurney, BSN, RN, CWS, Kim RN, BSN Entered By: Elliot Gurney, BSN, RN, CWS, Kim on 07/08/2022 11:26:14 [name] ([id number]) -------------------------------------------------------------------------------- Patient/Caregiver Education Details Patient Name: [name] Date of Service: 07/08/2022 11:15 AM Medical Record Number: [id number] Patient Account Number: [id number] Date of Birth/Gender: 19-May-1932 (88 y.o. Female) Treating RN: Yevonne Pax Primary Care Physician: Elizabeth Sauer Other Clinician: Referring Physician: Elizabeth Sauer Treating Physician/Extender: Rowan Blase in Treatment: 18 Education Assessment Education Provided To: Patient Education Topics Provided Wound/Skin Impairment: Methods: Explain/Verbal Responses: State content correctly Electronic Signature(s) Signed: 07/12/2022 4:31:33 PM By: Yevonne Pax RN Entered By: Yevonne Pax on 07/11/2022 11:28:44 [name] ([id number]) -------------------------------------------------------------------------------- Wound Assessment Details Patient Name: [name] Date of Service: 07/08/2022 11:15 AM Medical Record Number: [id number] Patient Account Number: [id number] Date of Birth/Sex: 04/13/1932 (88 y.o. Female) Treating RN: Huel Coventry Primary Care Gwenneth Whiteman: Elizabeth Sauer Other Clinician: Referring Braylee Lal: Elizabeth Sauer Treating Raylie Maddison/Extender: Allen Derry Weeks in Treatment: 18 Wound Status Wound Number: 2 Primary Pressure Ulcer Etiology: Wound Location: Right Gluteus Wound Healed - Epithelialized Wounding Event: Gradually Appeared Status: Date Acquired: 04/07/2022 Comorbid Arrhythmia, Coronary Artery Disease, Hypertension, Weeks Of Treatment: 12 History: Type II Diabetes, Osteoarthritis Clustered Wound: No Photos Wound Measurements Length: (cm) 0 %  Reducti Width: (cm) 0 % Reducti Depth: (cm) 0 Epithelia Area: (cm) 0.008 Tunnelin Volume: (cm) 0.001 Undermin on in Area: 98.4% on in Volume: 98% lization: Large (67-100%) g: No ing: No Wound Description Classification: Category/Stage III Foul Odor Exudate Amount: None Present Slough/Fi After Cleansing: No brino No Wound Bed Granulation Amount: None Present (0%) Exposed Structure Necrotic Amount: None Present (0%) Fascia Exposed: No Fat Layer (Subcutaneous Tissue) Exposed: No Tendon Exposed: No Muscle Exposed: No Joint Exposed: No Bone Exposed: No Treatment Notes Wound #2 (Gluteus) Wound Laterality: Right Cleanser Peri-Wound Care Topical Primary Dressing [name], [name] ([id number]) Secondary Dressing Secured With Compression Wrap Compression Stockings Add-Ons Electronic Signature(s) Signed: 08/05/2022 2:35:40 PM By: Elliot Gurney, BSN, RN, CWS, Kim RN, BSN Previous Signature: 07/08/2022 1:20:47 PM Version By: Elliot Gurney, BSN, RN, CWS, Kim RN, BSN Entered By: Elliot Gurney, BSN, RN, CWS, Kim on 08/02/2022 17:15:41 [name] ([id number]) -------------------------------------------------------------------------------- Vitals Details Patient Name: [name] Date of Service: 07/08/2022 11:15 AM Medical Record Number: [id number] Patient Account Number: [id number] Date of Birth/Sex: 1932-01-19 (88 y.o. Female) Treating RN: Huel Coventry Primary Care Jamaul Heist:  Elizabeth Sauer Other Clinician: Referring Kamsiyochukwu Buist: Elizabeth Sauer Treating Matina Rodier/Extender: Allen Derry Weeks in Treatment: 18 Vital Signs Time Taken: 11:25 Temperature (F): 98.2 Height (in): 63 Pulse (bpm): 97 Weight (lbs): 174 Respiratory Rate (breaths/min): 18 Body Mass Index (BMI): 30.8 Blood Pressure (mmHg): 157/80 Reference Range: 80 - 120 mg / dl Electronic Signature(s) Signed: 07/08/2022 1:20:47 PM By: Elliot Gurney, BSN, RN, CWS, Kim RN, BSN Entered By: Elliot Gurney, BSN, RN, CWS, Kim on 07/08/2022 11:26:08

## 2022-07-12 ENCOUNTER — Encounter: Payer: Self-pay | Admitting: Family Medicine

## 2022-07-12 ENCOUNTER — Ambulatory Visit (INDEPENDENT_AMBULATORY_CARE_PROVIDER_SITE_OTHER): Payer: Medicare Other | Admitting: Family Medicine

## 2022-07-12 VITALS — BP 122/80 | HR 64 | Ht 63.0 in | Wt 168.0 lb

## 2022-07-12 DIAGNOSIS — I509 Heart failure, unspecified: Secondary | ICD-10-CM

## 2022-07-12 DIAGNOSIS — I872 Venous insufficiency (chronic) (peripheral): Secondary | ICD-10-CM

## 2022-07-12 MED ORDER — FUROSEMIDE 20 MG PO TABS: 20.0000 mg | ORAL_TABLET | Freq: Every day | ORAL | 0 refills | Status: DC

## 2022-07-12 MED ORDER — TRIAMCINOLONE ACETONIDE 0.1 % EX CREA: 1.0000 | TOPICAL_CREAM | Freq: Two times a day (BID) | CUTANEOUS | 0 refills | Status: DC

## 2022-07-12 NOTE — Patient Instructions (Signed)
Stasis Dermatitis Stasis dermatitis is a long-term (chronic) skin condition that happens when veins can no longer pump blood back to the heart (poor circulation). This condition causes a red or brown scaly rash or sores (ulcers) from the pooling of blood (stasis). This condition usually affects the lower legs. It may affect one leg or both legs. Without treatment, severe stasis dermatitis can lead to other skin conditions and infections. What are the causes? This condition is caused by poor circulation. What increases the risk? You are more likely to develop this condition if: You are not very active. You stand for long periods of time. You have veins that have become enlarged and twisted (varicose veins). You have leg veins that are not strong enough to send blood back to the heart (venous insufficiency). You have had a blood clot. You have been pregnant many times. You have had vein surgery. You are obese. You have heart or kidney failure. You are 86 years of age or older. You have had injuries to your legs in the past. What are the signs or symptoms? Common early symptoms of this condition include: Itchiness in one or both of your legs. Swelling in your ankle or leg. This might get better overnight but be worse again during the day. Skin that looks thin on your ankle and leg. Red or brown marks that develop slowly. Skin that is dry, cracked, or easily irritated. Red, swollen skin that is sore or has a burning feeling. An achy or heavy feeling after you walk or stand for long periods of time. Pain. Later and more severe symptoms of this condition include: Skin that looks shiny. Small, open sores (ulcers). These are often red or purple and leak fluid. Skin that feels hard. Severe itching. A change in the shape or color of your lower legs. Severe pain. Difficulty walking. How is this diagnosed? This condition may be diagnosed based on: Your symptoms and medical history. A  physical exam. You may also have tests, including: Blood tests. Imaging tests to check blood flow (Doppler ultrasound). Allergy tests. You may need to see a health care provider who specializes in skin diseases (dermatologist). How is this treated? This condition may be treated with: Compression stockings or an elastic wrap to improve circulation. Medicines, such as: Corticosteroid creams and ointments. Non-corticosteroid medicines applied to the skin (topical). Medicine to reduce swelling in the legs (diuretics). Antibiotics. Medicine to relieve itching (antihistamines). A bandage (dressing). A wrap that contains zinc and gelatin (Unna boot). Follow these instructions at home: Skin care Moisturize your skin as told by your health care provider. Do not use moisturizers with fragrance. This can irritate your skin. Apply a cool, wet cloth (cool compress) to the affected areas. Do not scratch your skin. Do not rub your skin dry after a bath or shower. Gently pat your skin dry. Do not use scented soaps, detergents, or perfumes. Medicines Take or use over-the-counter and prescription medicines only as told by your health care provider. If you were prescribed an antibiotic medicine, take or use it as told by your health care provider. Do not stop taking or using the antibiotic even if your condition improves. Activity Walk as told by your health care provider. Walking increases blood flow. Do calf and ankle exercises throughout the day as told by your health care provider. This will help increase blood flow. Raise (elevate) your legs above the level of your heart when you are sitting or lying down. Lifestyle Work with your health   care provider to lose weight, if needed. Do not cross your legs when you sit. Do not stand or sit in one position for long periods of time. Wear comfortable, loose-fitting clothing. Circulation in your legs will be worse if you wear tight pants, belts, and  waistbands. Do not use any products that contain nicotine or tobacco, such as cigarettes, e-cigarettes, and chewing tobacco. If you need help quitting, ask your health care provider. General instructions If you were asked to use one of the following to help with your condition, follow instructions from your health care provider on how to: Remove and change any dressing. Wear compression stockings. These stockings help to prevent blood clots and reduce swelling in your legs. Wear the Unna boot. Keep all follow-up visits as told by your health care provider. This is important. Contact a health care provider if: Your condition does not improve with treatment. Your condition gets worse. You have signs of infection in the affected area. Watch for: Swelling. Tenderness. Redness. Soreness. Warmth. You have a fever. Get help right away if: You notice red streaks coming from the affected area. Your bone or joint underneath the affected area becomes painful after the skin has healed. The affected area turns darker. You feel a deep pain in your leg or groin. You are short of breath. Summary Stasis dermatitis is a long-term (chronic) skin condition that happens when veins can no longer pump blood back to the heart (poor circulation). Wear compression stockings as told by your health care provider. These stockings help to prevent blood clots and reduce swelling in your legs. Follow instructions from your health care provider about activity, medicines, and lifestyle. Contact a health care provider if you have a fever or have signs of infection in the affected area. Keep all follow-up visits as told by your health care provider. This is important. This information is not intended to replace advice given to you by your health care provider. Make sure you discuss any questions you have with your health care provider. Document Revised: 01/18/2021 Document Reviewed: 01/18/2021 [employer] Patient Education   2023 [employer]  

## 2022-07-12 NOTE — Progress Notes (Signed)
Date:  07/12/2022   Name:  [name]   DOB:  09/03/1932   MRN:  [id number]   Chief Complaint: Eczema (Pt is out of furosemide and cardio took her off of HCTZ- legs are swollen and cracked looking)  Rash This is a new problem. The current episode started 1 to 4 weeks ago. The affected locations include the right lower leg and left lower leg. The rash is characterized by itchiness and scaling. Pertinent negatives include no cough, fatigue, fever or shortness of breath. (Lower extremety edema) Past treatments include nothing.    Lab Results  Component Value Date   NA 137 01/17/2022   K 4.4 01/17/2022   CO2 21 01/17/2022   GLUCOSE 119 (H) 01/17/2022   BUN 17 01/17/2022   CREATININE 0.62 01/17/2022   CALCIUM 9.1 01/17/2022   EGFR 86 01/17/2022   GFRNONAA >60 04/13/2021   Lab Results  Component Value Date   CHOL 170 01/17/2022   HDL 62 01/17/2022   LDLCALC 97 01/17/2022   TRIG 57 01/17/2022   CHOLHDL 2.2 07/23/2019   Lab Results  Component Value Date   TSH 1.800 05/09/2022   Lab Results  Component Value Date   HGBA1C 6.5 (H) 01/17/2022   Lab Results  Component Value Date   WBC 5.4 04/13/2021   HGB 10.6 (L) 04/13/2021   HCT 33.3 (L) 04/13/2021   MCV 84.3 04/13/2021   PLT 215 04/13/2021   Lab Results  Component Value Date   ALT 5 01/17/2022   AST 11 01/17/2022   ALKPHOS 85 01/17/2022   BILITOT 0.3 01/17/2022   No results found for: "25OHVITD2", "25OHVITD3", "VD25OH"   Review of Systems  Constitutional:  Negative for fatigue, fever and unexpected weight change.  Respiratory:  Negative for cough, chest tightness, shortness of breath and wheezing.   Cardiovascular:  Positive for leg swelling. Negative for chest pain and palpitations.  Musculoskeletal:  Negative for back pain.  Skin:  Positive for rash.    Patient Active Problem List   Diagnosis Date Noted   Valvular heart disease 07/23/2021   Chronic pain of right knee 06/17/2021   Chronic pain  syndrome 06/17/2021   Near syncope 04/12/2021   Pain due to total right knee replacement ([place]) 03/02/2021   Aortic valve regurgitation 10/23/2020   Bradycardia 09/04/2019   PVC's (premature ventricular contractions) 09/04/2019   Leg edema 09/04/2019   Sinus bradycardia 03/16/2018   PSVT (paroxysmal supraventricular tachycardia) ([place]) 03/16/2018   NSVT (nonsustained ventricular tachycardia) ([place]) 03/16/2018   Malignant HTN with heart disease, w/o CHF, w/o chronic kidney disease 02/05/2018   Hyperlipidemia 09/24/2015   Diabetes mellitus with no complication ([place]) 83/33/8329   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   Arthritis of knee, degenerative 03/16/2015   Sinus infection 03/16/2015   Diabetes ([place])    Essential hypertension    GERD (gastroesophageal reflux disease)    History of bilateral knee replacement     Allergies  Allergen Reactions   Nsaids    Lisinopril     Cough    Past Surgical History:  Procedure Laterality Date   BACK SURGERY     CATARACT EXTRACTION Bilateral 03/2016   CERVICAL [place] SURGERY  12/28/2010   "bulging disc; went in on the left side of my neck" (08/19/2013)   JOINT REPLACEMENT     TOTAL KNEE ARTHROPLASTY Left 03/28/2011   Dr Noemi Chapel   TOTAL KNEE ARTHROPLASTY Right 08/19/2013   TOTAL KNEE ARTHROPLASTY Right 08/19/2013  Procedure: TOTAL KNEE ARTHROPLASTY;  Surgeon: Lorn Junes, MD;  Location: [facility];  Service: Orthopedics;  Laterality: Right;    Social History   Tobacco Use   Smoking status: Never   Smokeless tobacco: Never   Tobacco comments:    Smoking cessation materials not required  Vaping Use   Vaping Use: Never used  Substance Use Topics   Alcohol use: Not Currently   Drug use: Never     Medication list has been reviewed and updated.  Current Meds  Medication Sig   cyanocobalamin 100 MCG tablet Take 1 tablet by mouth daily.    docusate sodium (COLACE) 50 MG capsule Take 1 capsule (50 mg total) by mouth 2  (two) times daily.   glipiZIDE (GLUCOTROL XL) 2.5 MG 24 hr tablet Take 1 tablet (2.5 mg total) by mouth daily with breakfast.   glucose blood (ONE TOUCH ULTRA TEST) test strip USE 1 STRIP TO CHECK GLUCOSE ONCE DAILY   loratadine (CLARITIN) 10 MG tablet Take 1 tablet (10 mg total) by mouth daily.   metFORMIN (GLUCOPHAGE) 500 MG tablet TAKE 1 TABLET BY MOUTH TWICE DAILY WITH MEALS   metoprolol tartrate (LOPRESSOR) 25 MG tablet Take 0.5 tablets (12.5 mg total) by mouth 2 (two) times daily.   mometasone (NASONEX) 50 MCG/ACT nasal spray Use 2 spray(s) in each nostril once daily   montelukast (SINGULAIR) 10 MG tablet Take 1 tablet (10 mg total) by mouth at bedtime.   omeprazole (PRILOSEC) 20 MG capsule Take 1 capsule (20 mg total) by mouth daily.   tiZANidine (ZANAFLEX) 4 MG tablet Take 0.5-1 tablets (2-4 mg total) by mouth every 12 (twelve) hours as needed for muscle spasms.       07/12/2022    3:09 PM 05/06/2022    2:00 PM 09/14/2021    1:24 PM 05/20/2021    4:15 PM  GAD 7 : Generalized Anxiety Score  Nervous, Anxious, on Edge 0  0 0  Control/stop worrying 0 0 0 0  Worry too much - different things 0 0 0 0  Trouble relaxing 0 0 0 0  Restless 0 0 0 0  Easily annoyed or irritable 0 0 0 0  Afraid - awful might happen 0 0 0 0  Total GAD 7 Score 0  0 0  Anxiety Difficulty Not difficult at all Not difficult at all         07/12/2022    3:09 PM 05/06/2022    2:00 PM 12/29/2021    1:28 PM  Depression screen PHQ 2/9  Decreased Interest 0 0 0  Down, Depressed, Hopeless 0 0 0  PHQ - 2 Score 0 0 0  Altered sleeping 0 0   Tired, decreased energy 2 0   Change in appetite 0 0   Feeling bad or failure about yourself  0 0   Trouble concentrating 0 0   Moving slowly or fidgety/restless 0 0   Suicidal thoughts 0 0   PHQ-9 Score 2 0   Difficult doing work/chores Very difficult Not difficult at all     BP Readings from Last 3 Encounters:  07/12/22 122/80  05/09/22 104/70  05/06/22 124/70     Physical Exam Vitals and nursing note reviewed.  Constitutional:      Appearance: She is well-developed.  HENT:     Left Ear: External ear normal.  Eyes:     General: Lids are everted, no foreign bodies appreciated. No scleral icterus.  Left eye: No foreign body or hordeolum.     Conjunctiva/sclera:     Right eye: Right conjunctiva is not injected.     Left eye: Left conjunctiva is not injected.  Neck:     Vascular: No JVD.  Cardiovascular:     Rate and Rhythm: Normal rate and regular rhythm.     Heart sounds: Normal heart sounds.     No gallop.  Pulmonary:     Breath sounds: Normal breath sounds. No rales.  Abdominal:     Palpations: There is no mass.  Musculoskeletal:        General: No tenderness.     Cervical back: Normal range of motion and neck supple.     Right lower leg: Edema present.     Left lower leg: Edema present.  Skin:    Findings: Rash present. Rash is scaling.  Neurological:     Mental Status: She is alert.     Cranial Nerves: No cranial nerve deficit.     Deep Tendon Reflexes: Reflexes normal.  Psychiatric:        Mood and Affect: Mood is not anxious or depressed.     Wt Readings from Last 3 Encounters:  07/12/22 168 lb (76.2 kg)  05/09/22 168 lb (76.2 kg)  05/06/22 168 lb (76.2 kg)    BP 122/80   Pulse 64   Ht _0  (1.6 m)   Wt 168 lb (76.2 kg)   BMI 29.76 kg/m   Assessment and Plan:  1. Stasis dermatitis of both legs New onset.  Persistent.  Relatively stable.  Patient has been relatively inactive sitting most of the day with her legs in a dependent position which is caused gradual swelling of both legs and skin changes consistent with venous stasis.  We will treat with resuming her furosemide and treating the rash with triamcinolone cream mixed with some use range of motion apply twice a day to the legs and referral has been made to dermatology. - furosemide (LASIX) 20 MG tablet; Take 1 tablet (20 mg total) by mouth daily.   Dispense: 30 tablet; Refill: 0 - triamcinolone cream (KENALOG) 0.1 %; Apply 1 Application topically 2 (two) times daily.  Dispense: 30 g; Refill: 0 - Ambulatory referral to Dermatology  2. Acute congestive heart failure, unspecified heart failure type (HCC) Chronic.  Episodic.  Recently has stopped her furosemide and olmesartan and only put on beta-blocker.  Patient is also had her hydrochlorothiazide discontinued.  We will resume first her furosemide I will see her back in 4 weeks and likely exchanged out for hydrochlorothiazide if there has been decreased and the possibility that we may need to [facility] cardiology in the future. - furosemide (LASIX) 20 MG tablet; Take 1 tablet (20 mg total) by mouth daily.  Dispense: 30 tablet; Refill: 0  3. Venous insufficiency New onset.  Persistent.  Majority of the day spentsitting on her venous return and and relatively inactive.  Patient will increase activity but ultimately may need to refer to vein and vascular for evaluation and management for compression stockings. - furosemide (LASIX) 20 MG tablet; Take 1 tablet (20 mg total) by mouth daily.  Dispense: 30 tablet; Refill: 0    Otilio Miu, MD

## 2022-07-14 NOTE — Progress Notes (Deleted)
Cardiology Office Note:    Date:  07/14/2022   ID:  [name], DOB 09-02-1932, MRN [id number]  PCP:  Duanne Limerick, MD  [facility] Cardiologist:  Yvonne Kendall, MD  [facility] Electrophysiologist:  None   Referring MD: Duanne Limerick, MD   Chief Complaint: 2 month follow-up  History of Present Illness:    [name] is a 88 y.o. female with a hx of aortic and mitral valve regurgitation, NSVT, pSVT, HTN, DM2, h/o bradcyardia who presents for 6 month follow-up.    Previous 01/2018 echo showed EF 65 to 70%, G1 DD, mild to moderate MR, mild LAE, PASP 35 to 40 mmHg plus central venous/RAP.  Previous cardiac monitoring 01/2018 showed rate 43 to 152 bpm with occasional PACs and PVCs.  Brief episodes of SVT and nonsustained VT noted.  No sustained arrhythmias or prolonged pauses.   Heart monitor 04/2021 NSR with minimum heart rate 47 bpm, maximum 179 bpm and average heart rate 64 bpm.  Bundle branch block and IVCD was present.  106 supraventricular tachycardia runs occurred, the fastest of which lasted 4 beats with maximum rate 179 bpm and the longest 4 minutes 11 seconds with average rate 111 bpm.  Isolated PACs were frequent (8.6%, 111,213), atrial couplets were occasional (1.4%, 9304), and atrial triplets were rare (less than 1%, 3465).  Isolated PVCs were rare and less than 1%.     Echo 09/2021 showed LVEF 60-65%, no WMA, G1DD, moderate TR, mild AI.    Last seen 05/09/22 and was overall doing well.Heart monitor showed 10% PAC burden and she was referred back to EP.  Today,   Past Medical History:  Diagnosis Date   GERD (gastroesophageal reflux disease)    Hypertension    Right knee DJD    Type II diabetes mellitus (HCC)     Past Surgical History:  Procedure Laterality Date   BACK SURGERY     CATARACT EXTRACTION Bilateral 03/2016   CERVICAL [place] SURGERY  12/28/2010   "bulging disc; went in on the left side of my neck" (08/19/2013)   JOINT REPLACEMENT     TOTAL  KNEE ARTHROPLASTY Left 03/28/2011   Dr Thurston Hole   TOTAL KNEE ARTHROPLASTY Right 08/19/2013   TOTAL KNEE ARTHROPLASTY Right 08/19/2013   Procedure: TOTAL KNEE ARTHROPLASTY;  Surgeon: Nilda Simmer, MD;  Location: [facility];  Service: Orthopedics;  Laterality: Right;    Current Medications: No outpatient medications have been marked as taking for the 07/15/22 encounter (Appointment) with Fransico Michael, Niyah Mamaril H, PA-C.     Allergies:   Nsaids and Lisinopril   Social History   Socioeconomic History   Marital status: Widowed    Spouse name: Not on file   Number of children: 3   Years of education: Not on file   Highest education level: Not on file  Occupational History   Occupation: Retired  Tobacco Use   Smoking status: Never   Smokeless tobacco: Never   Tobacco comments:    Smoking cessation materials not required  Vaping Use   Vaping Use: Never used  Substance and Sexual Activity   Alcohol use: Not Currently   Drug use: Never   Sexual activity: Not Currently  Other Topics Concern   Not on file  Social History Narrative   Pt lives alone; does not drive   Social Determinants of Health   Financial Resource Strain: Low Risk  (08/18/2021)   Overall Financial Resource Strain (CARDIA)    Difficulty of  Paying Living Expenses: Not hard at all  Food Insecurity: No Food Insecurity (08/18/2021)   Hunger Vital Sign    Worried About Running Out of Food in the Last Year: Never true    Ran Out of Food in the Last Year: Never true  Transportation Needs: No Transportation Needs (08/18/2021)   PRAPARE - Administrator, Civil Service (Medical): No    Lack of Transportation (Non-Medical): No  Physical Activity: Inactive (08/18/2021)   Exercise Vital Sign    Days of Exercise per Week: 0 days    Minutes of Exercise per Session: 0 min  Stress: No Stress Concern Present (08/18/2021)   [employer] of Occupational Health - Occupational Stress Questionnaire    Feeling of Stress : Not at all   Social Connections: Moderately Isolated (08/18/2021)   Social Connection and Isolation Panel [NHANES]    Frequency of Communication with Friends and Family: More than three times a week    Frequency of Social Gatherings with Friends and Family: More than three times a week    Attends Religious Services: More than 4 times per year    Active Member of [employer] or Organizations: No    Attends Banker Meetings: Never    Marital Status: Widowed     Family History: The patient's ***family history includes Cerebral aneurysm in her father; Diabetes in her mother.  ROS:   Please see the history of present illness.    *** All other systems reviewed and are negative.  EKGs/Labs/Other Studies Reviewed:    The following studies were reviewed today: ***  EKG:  EKG is *** ordered today.  The ekg ordered today demonstrates ***  Recent Labs: 01/17/2022: ALT 5; BUN 17; Creatinine, Ser 0.62; Potassium 4.4; Sodium 137 05/09/2022: Magnesium 1.7; TSH 1.800  Recent Lipid Panel    Component Value Date/Time   CHOL 170 01/17/2022 1058   TRIG 57 01/17/2022 1058   HDL 62 01/17/2022 1058   CHOLHDL 2.2 07/23/2019 0946   LDLCALC 97 01/17/2022 1058     Risk Assessment/Calculations:   {Does this patient have ATRIAL FIBRILLATION?[phone]}   Physical Exam:    VS:  There were no vitals taken for this visit.    Wt Readings from Last 3 Encounters:  07/12/22 168 lb (76.2 kg)  05/09/22 168 lb (76.2 kg)  05/06/22 168 lb (76.2 kg)     GEN: *** Well nourished, well developed in no acute distress HEENT: Normal NECK: No JVD; No carotid bruits LYMPHATICS: No lymphadenopathy CARDIAC: ***RRR, no murmurs, rubs, gallops RESPIRATORY:  Clear to auscultation without rales, wheezing or rhonchi  ABDOMEN: Soft, non-tender, non-distended MUSCULOSKELETAL:  No edema; No deformity  SKIN: Warm and dry NEUROLOGIC:  Alert and oriented x 3 PSYCHIATRIC:  Normal affect   ASSESSMENT:    No diagnosis  found. PLAN:    In order of problems listed above:  ***  Disposition: Follow up {follow up:15908} with ***   Shared Decision Making/Informed Consent   {Are you ordering a CV Procedure (e.g. stress test, cath, DCCV, TEE, etc)?   Press F2        :[id number]}    Signed, Cheresa Siers David Stall, PA-C  07/14/2022 9:01 PM    [facility]

## 2022-07-15 ENCOUNTER — Ambulatory Visit: Payer: Medicare Other | Admitting: Medical

## 2022-07-27 ENCOUNTER — Ambulatory Visit: Payer: Medicare Other | Admitting: Cardiology

## 2022-07-31 DIAGNOSIS — M179 Osteoarthritis of knee, unspecified: Secondary | ICD-10-CM | POA: Diagnosis not present

## 2022-08-01 ENCOUNTER — Telehealth: Payer: Self-pay | Admitting: Family Medicine

## 2022-08-01 ENCOUNTER — Other Ambulatory Visit: Payer: Self-pay

## 2022-08-01 DIAGNOSIS — I872 Venous insufficiency (chronic) (peripheral): Secondary | ICD-10-CM

## 2022-08-01 MED ORDER — TRIAMCINOLONE ACETONIDE 0.1 % EX CREA: 1.0000 | TOPICAL_CREAM | Freq: Two times a day (BID) | CUTANEOUS | 0 refills | Status: DC

## 2022-08-01 NOTE — Telephone Encounter (Signed)
Copied from CRM [id number]. Topic: General - Other >> Aug 01, 2022 10:31 AM Lyman Speller wrote: Reason for CRM: Pt asked to speak with Delice Bison about a medication / please advise

## 2022-08-09 ENCOUNTER — Ambulatory Visit: Payer: Medicare Other | Admitting: Family Medicine

## 2022-08-10 ENCOUNTER — Ambulatory Visit (INDEPENDENT_AMBULATORY_CARE_PROVIDER_SITE_OTHER): Payer: Medicare Other | Admitting: Family Medicine

## 2022-08-10 ENCOUNTER — Encounter: Payer: Self-pay | Admitting: Family Medicine

## 2022-08-10 VITALS — BP 130/62 | HR 80 | Ht 63.0 in | Wt 168.0 lb

## 2022-08-10 DIAGNOSIS — Z23 Encounter for immunization: Secondary | ICD-10-CM

## 2022-08-10 DIAGNOSIS — M25562 Pain in left knee: Secondary | ICD-10-CM | POA: Diagnosis not present

## 2022-08-10 DIAGNOSIS — I872 Venous insufficiency (chronic) (peripheral): Secondary | ICD-10-CM | POA: Diagnosis not present

## 2022-08-10 DIAGNOSIS — G8929 Other chronic pain: Secondary | ICD-10-CM | POA: Diagnosis not present

## 2022-08-10 MED ORDER — FUROSEMIDE 20 MG PO TABS: 20.0000 mg | ORAL_TABLET | Freq: Every day | ORAL | 5 refills | Status: DC

## 2022-08-10 NOTE — Progress Notes (Unsigned)
Date:  08/10/2022   Name:  [name]   DOB:  [birth year]   MRN:  [id number]   Chief Complaint: Follow-up (Leg- looking better, released from wound center), Leg Swelling (Does she need to cont. furosemide), and Flu Vaccine  Patient is a 88 year old female who presents for a leg swelling exam. The patient reports the following problems: left knee pain. Health maintenance has been reviewed up to date.      Lab Results  Component Value Date   NA 137 01/17/2022   K 4.4 01/17/2022   CO2 21 01/17/2022   GLUCOSE 119 (H) 01/17/2022   BUN 17 01/17/2022   CREATININE 0.62 01/17/2022   CALCIUM 9.1 01/17/2022   EGFR 86 01/17/2022   GFRNONAA >60 04/13/2021   Lab Results  Component Value Date   CHOL 170 01/17/2022   HDL 62 01/17/2022   LDLCALC 97 01/17/2022   TRIG 57 01/17/2022   CHOLHDL 2.2 07/23/2019   Lab Results  Component Value Date   TSH 1.800 05/09/2022   Lab Results  Component Value Date   HGBA1C 6.5 (H) 01/17/2022   Lab Results  Component Value Date   WBC 5.4 04/13/2021   HGB 10.6 (L) 04/13/2021   HCT 33.3 (L) 04/13/2021   MCV 84.3 04/13/2021   PLT 215 04/13/2021   Lab Results  Component Value Date   ALT 5 01/17/2022   AST 11 01/17/2022   ALKPHOS 85 01/17/2022   BILITOT 0.3 01/17/2022   No results found for: "25OHVITD2", "25OHVITD3", "VD25OH"   Review of Systems  Patient Active Problem List   Diagnosis Date Noted   Valvular heart disease 07/23/2021   Chronic pain of right knee 06/17/2021   Chronic pain syndrome 06/17/2021   Near syncope 04/12/2021   Pain due to total right knee replacement (HCC) 03/02/2021   Aortic valve regurgitation 10/23/2020   Bradycardia 09/04/2019   PVC's (premature ventricular contractions) 09/04/2019   Leg edema 09/04/2019   Sinus bradycardia 03/16/2018   PSVT (paroxysmal supraventricular tachycardia) ([place]) 03/16/2018   NSVT (nonsustained ventricular tachycardia) ([place]) 03/16/2018   Malignant HTN with heart disease, w/o  CHF, w/o chronic kidney disease 02/05/2018   Hyperlipidemia 09/24/2015   Diabetes mellitus with no complication ([place]) 30/07/2329   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   Arthritis of knee, degenerative 03/16/2015   Sinus infection 03/16/2015   Diabetes ([place])    Essential hypertension    GERD (gastroesophageal reflux disease)    History of bilateral knee replacement     Allergies  Allergen Reactions   Nsaids    Lisinopril     Cough    Past Surgical History:  Procedure Laterality Date   BACK SURGERY     CATARACT EXTRACTION Bilateral 03/2016   CERVICAL [place] SURGERY  12/28/2010   "bulging disc; went in on the left side of my neck" (08/19/2013)   JOINT REPLACEMENT     TOTAL KNEE ARTHROPLASTY Left 03/28/2011   Dr Noemi Chapel   TOTAL KNEE ARTHROPLASTY Right 08/19/2013   TOTAL KNEE ARTHROPLASTY Right 08/19/2013   Procedure: TOTAL KNEE ARTHROPLASTY;  Surgeon: Lorn Junes, MD;  Location: [facility];  Service: Orthopedics;  Laterality: Right;    Social History   Tobacco Use   Smoking status: Never   Smokeless tobacco: Never   Tobacco comments:    Smoking cessation materials not required  Vaping Use   Vaping Use: Never used  Substance Use Topics   Alcohol use: Not Currently   Drug  use: Never     Medication list has been reviewed and updated.  Current Meds  Medication Sig   cyanocobalamin 100 MCG tablet Take 1 tablet by mouth daily.    docusate sodium (COLACE) 50 MG capsule Take 1 capsule (50 mg total) by mouth 2 (two) times daily.   furosemide (LASIX) 20 MG tablet Take 1 tablet (20 mg total) by mouth daily.   glipiZIDE (GLUCOTROL XL) 2.5 MG 24 hr tablet Take 1 tablet (2.5 mg total) by mouth daily with breakfast.   glucose blood (ONE TOUCH ULTRA TEST) test strip USE 1 STRIP TO CHECK GLUCOSE ONCE DAILY   loratadine (CLARITIN) 10 MG tablet Take 1 tablet (10 mg total) by mouth daily.   metFORMIN (GLUCOPHAGE) 500 MG tablet TAKE 1 TABLET BY MOUTH TWICE DAILY WITH  MEALS   metoprolol tartrate (LOPRESSOR) 25 MG tablet Take 0.5 tablets (12.5 mg total) by mouth 2 (two) times daily.   mometasone (NASONEX) 50 MCG/ACT nasal spray Use 2 spray(s) in each nostril once daily   montelukast (SINGULAIR) 10 MG tablet Take 1 tablet (10 mg total) by mouth at bedtime.   omeprazole (PRILOSEC) 20 MG capsule Take 1 capsule (20 mg total) by mouth daily.   triamcinolone cream (KENALOG) 0.1 % Apply 1 Application topically 2 (two) times daily.       08/10/2022   11:24 AM 07/12/2022    3:09 PM 05/06/2022    2:00 PM 09/14/2021    1:24 PM  GAD 7 : Generalized Anxiety Score  Nervous, Anxious, on Edge 0 0  0  Control/stop worrying 0 0 0 0  Worry too much - different things 0 0 0 0  Trouble relaxing 0 0 0 0  Restless 0 0 0 0  Easily annoyed or irritable 0 0 0 0  Afraid - awful might happen 0 0 0 0  Total GAD 7 Score 0 0  0  Anxiety Difficulty Not difficult at all Not difficult at all Not difficult at all        08/10/2022   11:23 AM 07/12/2022    3:09 PM 05/06/2022    2:00 PM  Depression screen PHQ 2/9  Decreased Interest 0 0 0  Down, Depressed, Hopeless 0 0 0  PHQ - 2 Score 0 0 0  Altered sleeping 0 0 0  Tired, decreased energy 0 2 0  Change in appetite 0 0 0  Feeling bad or failure about yourself  0 0 0  Trouble concentrating 0 0 0  Moving slowly or fidgety/restless 0 0 0  Suicidal thoughts 0 0 0  PHQ-9 Score 0 2 0  Difficult doing work/chores Not difficult at all Very difficult Not difficult at all    BP Readings from Last 3 Encounters:  08/10/22 130/62  07/12/22 122/80  05/09/22 104/70    Physical Exam Vitals and nursing note reviewed. Exam conducted with a chaperone present.  Constitutional:      General: She is not in acute distress.    Appearance: She is not diaphoretic.  HENT:     Head: Normocephalic and atraumatic.     Right Ear: External ear normal.     Left Ear: External ear normal.     Nose: Nose normal.  Eyes:     General:        Right  eye: No discharge.        Left eye: No discharge.     Conjunctiva/sclera: Conjunctivae normal.     Pupils: Pupils are equal,  round, and reactive to light.  Neck:     Thyroid: No thyromegaly.     Vascular: No JVD.  Cardiovascular:     Rate and Rhythm: Normal rate and regular rhythm.     Heart sounds: Normal heart sounds. No murmur heard.    No friction rub. No gallop.  Pulmonary:     Effort: Pulmonary effort is normal.     Breath sounds: Normal breath sounds.  Abdominal:     General: Bowel sounds are normal.     Palpations: Abdomen is soft. There is no mass.     Tenderness: There is no abdominal tenderness. There is no guarding.  Musculoskeletal:        General: Normal range of motion.     Cervical back: Normal range of motion and neck supple.  Lymphadenopathy:     Cervical: No cervical adenopathy.  Skin:    General: Skin is warm and dry.  Neurological:     Mental Status: She is alert.     Wt Readings from Last 3 Encounters:  08/10/22 168 lb (76.2 kg)  07/12/22 168 lb (76.2 kg)  05/09/22 168 lb (76.2 kg)    BP 130/62   Pulse 80   Ht 5' 3"  (1.6 m)   Wt 168 lb (76.2 kg)   BMI 29.76 kg/m   Assessment and Plan:  1. Venous insufficiency Patient has a history of venous insufficiency and was originally put on furosemide not necessarily only for edema but to facilitate cardiac output.  Patient has been taking furosemide on a daily basis and we will cut back to 3 times a week and that she has some dehydration at this stage and needs to be encouraged to cut back on her sodium intake. - furosemide (LASIX) 20 MG tablet; Take 1 tablet (20 mg total) by mouth daily. Decrease dosing to three times a week  Dispense: 30 tablet; Refill: 5  2. Chronic pain of left knee Patient has chronic pain of her left knee which limits her in her ability to ambulate however she is taking this to remaining in a sitting position for the majority of 24-hour day.  This includes sleeping in a recliner as  well.  I have encouraged her to walk more during the day and if necessary will refer to sports medicine for evaluation of her knee pain.  3. Stasis dermatitis of both legs This is improved significantly with the reduction of swelling in her feet.  We will cut back on the Lasix at this time and encourage elevation and sleep and in the bed rather than a recliner. - furosemide (LASIX) 20 MG tablet; Take 1 tablet (20 mg total) by mouth daily. Decrease dosing to three times a week  Dispense: 30 tablet; Refill: 5  4. Need for immunization against influenza Discussed and administered - Flu Vaccine QUAD High Dose(Fluad)    Otilio Miu, MD

## 2022-08-22 ENCOUNTER — Ambulatory Visit: Payer: Medicare Other

## 2022-08-26 ENCOUNTER — Ambulatory Visit (INDEPENDENT_AMBULATORY_CARE_PROVIDER_SITE_OTHER): Payer: Medicare Other

## 2022-08-26 DIAGNOSIS — Z0000 Encounter for general adult medical examination without abnormal findings: Secondary | ICD-10-CM

## 2022-08-26 NOTE — Patient Instructions (Signed)

## 2022-08-26 NOTE — Progress Notes (Signed)
I connected with  [name] on 08/26/22 by a audio enabled telemedicine application and verified that I am speaking with the correct person using two identifiers.  Patient Location: Home  Provider Location: Office/Clinic  I discussed the limitations of evaluation and management by telemedicine. The patient expressed understanding and agreed to proceed.   Subjective:   [name] is a 88 y.o. female who presents for Medicare Annual (Subsequent) preventive examination.  Review of Systems    Per HPI unless specifically indicated below.  Cardiac Risk Factors include: advanced age (>47men, >48 women);female gender, essential hypertension, diabetes and hyperlipidemia.          Objective:       08/10/2022   11:25 AM 07/12/2022    3:05 PM 05/09/2022    3:30 PM  Vitals with BMI  Height 5\' 3"  5\' 3"  5\' 3"   Weight 168 lbs 168 lbs 168 lbs  BMI 29.77 [id number]  Systolic [id number]  Diastolic 62 80 70  Pulse 80 64 66    Today's Vitals   08/26/22 1108  PainSc: 0-No pain   There is no height or weight on file to calculate BMI.     12/29/2021    1:28 PM 10/06/2021   10:15 AM 08/18/2021   11:00 AM 06/30/2021   10:04 AM 04/13/2021    2:32 AM 04/12/2021    4:02 PM 02/05/2018    5:58 PM  Advanced Directives  Does Patient Have a Medical Advance Directive? No No No Yes No No No  Type of Advance Directive    [place]     Does patient want to make changes to medical advance directive?     No - Patient declined    Would patient like information on creating a medical advance directive? No - Patient declined No - Patient declined No - Patient declined  No - Patient declined No - Patient declined No - Patient declined    Current Medications (verified) Outpatient Encounter Medications as of 08/26/2022  Medication Sig   cyanocobalamin 100 MCG tablet Take 1 tablet by mouth daily.    furosemide (LASIX) 20 MG tablet Take 1 tablet (20 mg total) by mouth daily.  Decrease dosing to three times a week   glipiZIDE (GLUCOTROL XL) 2.5 MG 24 hr tablet Take 1 tablet (2.5 mg total) by mouth daily with breakfast.   glucose blood (ONE TOUCH ULTRA TEST) test strip USE 1 STRIP TO CHECK GLUCOSE ONCE DAILY   loratadine (CLARITIN) 10 MG tablet Take 1 tablet (10 mg total) by mouth daily.   metFORMIN (GLUCOPHAGE) 500 MG tablet TAKE 1 TABLET BY MOUTH TWICE DAILY WITH MEALS   metoprolol tartrate (LOPRESSOR) 25 MG tablet Take 0.5 tablets (12.5 mg total) by mouth 2 (two) times daily.   mometasone (NASONEX) 50 MCG/ACT nasal spray Use 2 spray(s) in each nostril once daily   omeprazole (PRILOSEC) 20 MG capsule Take 1 capsule (20 mg total) by mouth daily.   triamcinolone cream (KENALOG) 0.1 % Apply 1 Application topically 2 (two) times daily.   benzonatate (TESSALON) 100 MG capsule Take 1 capsule (100 mg total) by mouth 2 (two) times daily as needed for cough. (Patient not taking: Reported on 07/12/2022)   docusate sodium (COLACE) 50 MG capsule Take 1 capsule (50 mg total) by mouth 2 (two) times daily. (Patient not taking: Reported on 08/26/2022)   montelukast (SINGULAIR) 10 MG tablet Take 1 tablet (10 mg total) by mouth at bedtime. (  Patient not taking: Reported on 08/26/2022)   No facility-administered encounter medications on file as of 08/26/2022.    Allergies (verified) Nsaids and Lisinopril   History: Past Medical History:  Diagnosis Date   GERD (gastroesophageal reflux disease)    Hypertension    Right knee DJD    Type II diabetes mellitus ([place])    Past Surgical History:  Procedure Laterality Date   BACK SURGERY     CATARACT EXTRACTION Bilateral 03/2016   CERVICAL [place] SURGERY  12/28/2010   "bulging disc; went in on the left side of my neck" (08/19/2013)   JOINT REPLACEMENT     TOTAL KNEE ARTHROPLASTY Left 03/28/2011   Dr Noemi Chapel   TOTAL KNEE ARTHROPLASTY Right 08/19/2013   TOTAL KNEE ARTHROPLASTY Right 08/19/2013   Procedure: TOTAL KNEE ARTHROPLASTY;  Surgeon:  Lorn Junes, MD;  Location: [facility];  Service: Orthopedics;  Laterality: Right;   Family History  Problem Relation Age of Onset   Diabetes Mother    Cerebral aneurysm Father    Social History   Socioeconomic History   Marital status: Widowed    Spouse name: Not on file   Number of children: 3   Years of education: Not on file   Highest education level: Not on file  Occupational History   Occupation: Retired  Tobacco Use   Smoking status: Never   Smokeless tobacco: Never   Tobacco comments:    Smoking cessation materials not required  Vaping Use   Vaping Use: Never used  Substance and Sexual Activity   Alcohol use: Not Currently   Drug use: Never   Sexual activity: Not Currently  Other Topics Concern   Not on file  Social History Narrative   Pt lives alone; does not drive   Social Determinants of Health   Financial Resource Strain: Low Risk  (08/26/2022)   Overall Financial Resource Strain (CARDIA)    Difficulty of Paying Living Expenses: Not hard at all  Food Insecurity: No Food Insecurity (08/26/2022)   Hunger Vital Sign    Worried About Running Out of Food in the Last Year: Never true    [place] in the Last Year: Never true  Transportation Needs: No Transportation Needs (08/26/2022)   PRAPARE - Hydrologist (Medical): No    Lack of Transportation (Non-Medical): No  Physical Activity: Inactive (08/18/2021)   Exercise Vital Sign    Days of Exercise per Week: 0 days    Minutes of Exercise per Session: 0 min  Stress: No Stress Concern Present (08/26/2022)   [place]    Feeling of Stress : Not at all  Social Connections: Moderately Isolated (08/26/2022)   Social Connection and Isolation Panel [NHANES]    Frequency of Communication with Friends and Family: More than three times a week    Frequency of Social Gatherings with Friends and Family: Once a week    Attends  Religious Services: 1 to 4 times per year    Active Member of [employer] or Organizations: No    Attends Archivist Meetings: Never    Marital Status: Widowed    Tobacco Counseling Counseling given: No Tobacco comments: Smoking cessation materials not required   Clinical Intake:  Pre-visit preparation completed: No  Pain : No/denies pain Pain Score: 0-No pain     Nutritional Status: BMI of 19-24  Normal Nutritional Risks: None  How often do you need to have  someone help you when you read instructions, pamphlets, or other written materials from your doctor or pharmacy?: 1 - Never  Diabetic?Nutrition Risk Assessment:  Has the patient had any N/V/D within the last 2 months?  No  Does the patient have any non-healing wounds?  No  Has the patient had any unintentional weight loss or weight gain?  No   Diabetes:  Is the patient diabetic?  Yes  If diabetic, was a CBG obtained today?  Yes  Did the patient bring in their glucometer from home?  Yes  How often do you monitor your CBG's? Once week.   Financial Strains and Diabetes Management:  Are you having any financial strains with the device, your supplies or your medication? No .  Does the patient want to be seen by Chronic Care Management for management of their diabetes?  No  Would the patient like to be referred to a Nutritionist or for Diabetic Management?  No   Diabetic Exams:  Diabetic Eye Exam: Completed 03/2022, pt reported will request results. Diabetic Foot Exam: Overdue, Pt has been advised about the importance in completing this exam. Pt is scheduled for diabetic foot exam on next visit.     Information entered by :: Donnie Mesa, [place]   Activities of Daily Living    08/26/2022   11:05 AM  In your present state of health, do you have any difficulty performing the following activities:  Hearing? 0  Vision? 1  Comment Dr. Thre[facility]  Difficulty concentrating or making decisions? 0   Walking or climbing stairs? 1  Dressing or bathing? 0  Doing errands, shopping? 1    Patient Care Team: Juline Patch, MD as PCP - General (Family Medicine) End, Harrell Gave, MD as PCP - Cardiology (Cardiology)  Indicate any recent Medical Services you may have received from other than [facility] providers in the past year (date may be approximate).    The pt was seen at [facility] on 02/10/2022 for chronic pain of both knees. Assessment:   This is a routine wellness examination for Cayla.  Hearing/Vision screen Denies any hearing issues. Denies any vision issues, Annual Eye Exam done by Dr. Gloriann Loan. Wear glasses.  Dietary issues and exercise activities discussed: Current Exercise Habits: The patient does not participate in regular exercise at present, Exercise limited by: orthopedic condition(s)   Goals Addressed   None    Depression Screen    08/26/2022   11:05 AM 08/10/2022   11:23 AM 07/12/2022    3:09 PM 05/06/2022    2:00 PM 12/29/2021    1:28 PM 10/06/2021   10:15 AM 09/14/2021    1:23 PM  PHQ 2/9 Scores  PHQ - 2 Score 0 0 0 0 0 0 0  PHQ- 9 Score  0 2 0   0    Fall Risk    08/26/2022   11:05 AM 08/10/2022   11:23 AM 07/12/2022    3:08 PM 05/06/2022    2:00 PM 12/29/2021    1:28 PM  [place] in the past year? 0 0 0 0 0  Number falls in past yr: 0 0 0 0 0  Injury with Fall? 0 0 0 0 0  Risk for fall due to : No Fall Risks Impaired balance/gait History of fall(s) Impaired balance/gait   Follow up Falls evaluation completed Falls evaluation completed;Falls prevention discussed Falls evaluation completed Falls evaluation completed     FALL RISK PREVENTION PERTAINING  TO THE HOME:  Any stairs in or around the home? No  If so, are there any without handrails?  No stairs in the home  Home free of loose throw rugs in walkways, pet beds, electrical cords, etc? Yes  Adequate lighting in your home to reduce risk of falls? Yes   ASSISTIVE DEVICES UTILIZED  TO PREVENT FALLS:  Life alert? No  Use of a cane, walker or w/c? Yes  Grab bars in the bathroom? Yes  Shower chair or bench in shower? Yes  Elevated toilet seat or a handicapped toilet? Yes   TIMED UP AND GO:  Was the test performed?  no, virtual appt .  Cognitive Function:        08/26/2022   11:06 AM 08/17/2020   11:08 AM  6CIT Screen  What Year? 0 points 0 points  What month? 0 points 0 points  What time? 0 points 0 points  Count back from 20 2 points 0 points  Months in reverse 0 points 4 points  Repeat phrase 2 points 10 points  Total Score 4 points 14 points    Immunizations Immunization History  Administered Date(s) Administered   Fluad Quad(high Dose 65+) 07/23/2019, 08/17/2020, 09/14/2021, 08/10/2022   Influenza, High Dose Seasonal PF 10/01/2018   PFIZER(Purple Top)SARS-COV-2 Vaccination 01/29/2020, 02/19/2020   Pneumococcal Conjugate-13 09/24/2015   Pneumococcal Polysaccharide-23 03/28/2013   Pneumococcal-Unspecified 03/28/2013   Tdap 05/10/2016    TDAP status: Up to date  Flu Vaccine status: Up to date  Pneumococcal vaccine status: Up to date  Covid-19 vaccine status: Information provided on how to obtain vaccines.   Qualifies for Shingles Vaccine? Yes   Zostavax completed No   Shingrix Completed?: No.    Education has been provided regarding the importance of this vaccine. Patient has been advised to call insurance company to determine out of pocket expense if they have not yet received this vaccine. Advised may also receive vaccine at local pharmacy or Health Dept. Verbalized acceptance and understanding.  Screening Tests Health Maintenance  Topic Date Due   OPHTHALMOLOGY EXAM  07/04/2020   FOOT EXAM  02/04/2022   HEMOGLOBIN A1C  07/17/2022   COVID-19 Vaccine (3 - Pfizer series) 08/26/2022 (Originally 04/15/2020)   Zoster Vaccines- Shingrix (1 of 2) 11/09/2022 (Originally 01/19/1984)   DEXA SCAN  08/11/2023 (Originally 01/18/1999)   TETANUS/TDAP   05/10/2026   Pneumonia Vaccine 96+ Years old  Completed   INFLUENZA VACCINE  Completed   HPV VACCINES  Aged Out    Health Maintenance  Health Maintenance Due  Topic Date Due   OPHTHALMOLOGY EXAM  07/04/2020   FOOT EXAM  02/04/2022   HEMOGLOBIN A1C  07/17/2022    Colorectal cancer screening: No longer required.   Mammogram status: No longer required due to age.  DEXA scan: postponed   Lung Cancer Screening: (Low Dose CT Chest recommended if Age 71-80 years, 30 pack-year currently smoking OR have quit w/in 15years.) does not qualify.     Additional Screening:  Hepatitis C Screening: does not qualify;  Vision Screening: Recommended annual ophthalmology exams for early detection of glaucoma and other disorders of the eye. Is the patient up to date with their annual eye exam?  Yes  Who is the provider or what is the name of the office in which the patient attends annual eye exams? Dr. Gloriann Loan If pt is not established with a provider, would they like to be referred to a provider to establish care? No .   Dental  Screening: Recommended annual dental exams for proper oral hygiene  Community Resource Referral / Chronic Care Management: CRR required this visit?  No   CCM required this visit?  No      Plan:     I have personally reviewed and noted the following in the patient's chart:   Medical and social history Use of alcohol, tobacco or illicit drugs  Current medications and supplements including opioid prescriptions. Patient is not currently taking opioid prescriptions. Functional ability and status Nutritional status Physical activity Advanced directives List of other physicians Hospitalizations, surgeries, and ER visits in previous 12 months Vitals Screenings to include cognitive, depression, and falls Referrals and appointments  In addition, I have reviewed and discussed with patient certain preventive protocols, quality metrics, and best practice recommendations.  A written personalized care plan for preventive services as well as general preventive health recommendations were provided to patient.    Ms. Chretien , Thank you for taking time to come for your Medicare Wellness Visit. I appreciate your ongoing commitment to your health goals. Please review the following plan we discussed and let me know if I can assist you in the future.   These are the goals we discussed:  Goals      Prevent falls     Prevent falls with safety devices in home as well as balance and strength training        This is a list of the screening recommended for you and due dates:  Health Maintenance  Topic Date Due   Eye exam for diabetics  07/04/2020   Complete foot exam   02/04/2022   Hemoglobin A1C  07/17/2022   COVID-19 Vaccine (3 - Pfizer series) 08/26/2022*   Zoster (Shingles) Vaccine (1 of 2) 11/09/2022*   DEXA scan (bone density measurement)  08/11/2023*   Tetanus Vaccine  05/10/2026   Pneumonia Vaccine  Completed   Flu Shot  Completed   HPV Vaccine  Aged Out  *Topic was postponed. The date shown is not the original due date.     Wilson Singer, [place]   08/26/2022   Nurse Notes: Approximately 30 minute Non-Face-to-face visit

## 2022-08-30 DIAGNOSIS — M179 Osteoarthritis of knee, unspecified: Secondary | ICD-10-CM | POA: Diagnosis not present

## 2022-08-31 ENCOUNTER — Other Ambulatory Visit: Payer: Self-pay | Admitting: Family Medicine

## 2022-08-31 DIAGNOSIS — E119 Type 2 diabetes mellitus without complications: Secondary | ICD-10-CM

## 2022-09-01 NOTE — Telephone Encounter (Signed)
Requested Prescriptions  Pending Prescriptions Disp Refills  . glipiZIDE (GLUCOTROL XL) 2.5 MG 24 hr tablet [Pharmacy Med Name: glipiZIDE ER 2.5 MG Oral Tablet Extended Release 24 Hour] 90 tablet 0    Sig: Take 1 tablet by mouth once daily with breakfast     Endocrinology:  Diabetes - Sulfonylureas Failed - 08/31/2022  4:03 PM      Failed - HBA1C is between 0 and 7.9 and within 180 days    Hgb A1c MFr Bld  Date Value Ref Range Status  01/17/2022 6.5 (H) 4.8 - 5.6 % Final    Comment:             Prediabetes: 5.7 - 6.4          Diabetes: >6.4          Glycemic control for adults with diabetes: <7.0          Passed - Cr in normal range and within 360 days    Creatinine, Ser  Date Value Ref Range Status  01/17/2022 0.62 0.57 - 1.00 mg/dL Final         Passed - Valid encounter within last 6 months    Recent Outpatient Visits          3 weeks ago Venous insufficiency   [place] [facility] [place] [place], [place]   1 month ago Stasis dermatitis of both legs   [place] [facility] at [facility], MD   3 months ago Acute congestive heart failure, unspecified heart failure type [facility])   [place] [facility] at [facility], MD   7 months ago Type 2 diabetes mellitus without complication, without long-term current use of insulin ([place])   [place] [facility] at [facility], MD   11 months ago Type 2 diabetes mellitus without complication, without long-term current use of insulin ([place])   [place] [facility] at [place], [place], MD      Future Appointments            In 2 weeks Juline Patch, MD [facility] Medicine at [facility], [place]   In 3 months Juline Patch, MD [place] and Sports Medicine at [facility], [facility]

## 2022-09-07 ENCOUNTER — Encounter: Payer: Self-pay | Admitting: Cardiology

## 2022-09-07 ENCOUNTER — Ambulatory Visit: Payer: Medicare Other | Attending: Cardiology | Admitting: Cardiology

## 2022-09-07 VITALS — BP 142/86 | HR 56 | Ht 63.0 in | Wt 172.8 lb

## 2022-09-07 DIAGNOSIS — I4729 Other ventricular tachycardia: Secondary | ICD-10-CM | POA: Diagnosis not present

## 2022-09-07 DIAGNOSIS — I493 Ventricular premature depolarization: Secondary | ICD-10-CM | POA: Diagnosis not present

## 2022-09-07 DIAGNOSIS — I471 Supraventricular tachycardia, unspecified: Secondary | ICD-10-CM | POA: Diagnosis not present

## 2022-09-07 MED ORDER — METOPROLOL TARTRATE 25 MG PO TABS: 25.0000 mg | ORAL_TABLET | Freq: Two times a day (BID) | ORAL | 3 refills | Status: DC

## 2022-09-07 NOTE — Progress Notes (Signed)
Electrophysiology Office Note:    Date:  09/07/2022   ID:  [name], DOB Jan 27, 1932, MRN [id number]  PCP:  Duanne Limerick, MD  [facility] Cardiologist:  Yvonne Kendall, MD  [facility] Electrophysiologist:  Lanier Prude, MD   Referring MD: Marianne Sofia, PA-C   Chief Complaint: Abnormal heart monitor  History of Present Illness:    [name] is a 88 y.o. female who presents for an evaluation of abnormal heart monitor at the request of Cadence Furth, PA-C. Their medical history includes hypertension, GERD, diabetes, PACs.  The patient was seen by Cadence May 09, 2022 in follow-up.  At that appointment she reported doing well without any symptoms of palpitations.  She did wear a heart monitor and June 2023 which showed PACs and PVCs.  The patient also had an episode of SVT during the monitoring period.  1 of these episodes lasted for 80 minutes with a maximal heart rate of 218 bpm.  Today she reports no symptoms of palpitations, lightheadedness, dizziness, syncope.  Her biggest complaint is some bilateral feet swelling that is worse throughout the day and then improves by the time she wakes up.  She is with family today in clinic.     Past Medical History:  Diagnosis Date   GERD (gastroesophageal reflux disease)    Hypertension    Right knee DJD    Type II diabetes mellitus (HCC)     Past Surgical History:  Procedure Laterality Date   BACK SURGERY     CATARACT EXTRACTION Bilateral 03/2016   CERVICAL DISC SURGERY  12/28/2010   "bulging disc; went in on the left side of my neck" (08/19/2013)   JOINT REPLACEMENT     TOTAL KNEE ARTHROPLASTY Left 03/28/2011   Dr Thurston Hole   TOTAL KNEE ARTHROPLASTY Right 08/19/2013   TOTAL KNEE ARTHROPLASTY Right 08/19/2013   Procedure: TOTAL KNEE ARTHROPLASTY;  Surgeon: Nilda Simmer, MD;  Location: [facility] OR;  Service: Orthopedics;  Laterality: Right;    Current Medications: Current Meds  Medication Sig   benzonatate  (TESSALON) 100 MG capsule Take 1 capsule (100 mg total) by mouth 2 (two) times daily as needed for cough.   cyanocobalamin 100 MCG tablet Take 1 tablet by mouth daily.    docusate sodium (COLACE) 50 MG capsule Take 1 capsule (50 mg total) by mouth 2 (two) times daily.   furosemide (LASIX) 20 MG tablet Take 1 tablet (20 mg total) by mouth daily. Decrease dosing to three times a week   glipiZIDE (GLUCOTROL XL) 2.5 MG 24 hr tablet Take 1 tablet by mouth once daily with breakfast   glucose blood (ONE TOUCH ULTRA TEST) test strip USE 1 STRIP TO CHECK GLUCOSE ONCE DAILY   loratadine (CLARITIN) 10 MG tablet Take 1 tablet (10 mg total) by mouth daily.   metFORMIN (GLUCOPHAGE) 500 MG tablet TAKE 1 TABLET BY MOUTH TWICE DAILY WITH MEALS   metoprolol tartrate (LOPRESSOR) 25 MG tablet Take 1 tablet (25 mg total) by mouth 2 (two) times daily.   mometasone (NASONEX) 50 MCG/ACT nasal spray Use 2 spray(s) in each nostril once daily   montelukast (SINGULAIR) 10 MG tablet Take 1 tablet (10 mg total) by mouth at bedtime.   omeprazole (PRILOSEC) 20 MG capsule Take 1 capsule (20 mg total) by mouth daily.   triamcinolone cream (KENALOG) 0.1 % Apply 1 Application topically 2 (two) times daily.   [DISCONTINUED] metoprolol tartrate (LOPRESSOR) 25 MG tablet Take 0.5 tablets (12.5 mg  total) by mouth 2 (two) times daily.     Allergies:   Nsaids and Lisinopril   Social History   Socioeconomic History   Marital status: Widowed    Spouse name: Not on file   Number of children: 3   Years of education: Not on file   Highest education level: Not on file  Occupational History   Occupation: Retired  Tobacco Use   Smoking status: Never   Smokeless tobacco: Never   Tobacco comments:    Smoking cessation materials not required  Vaping Use   Vaping Use: Never used  Substance and Sexual Activity   Alcohol use: Not Currently   Drug use: Never   Sexual activity: Not Currently  Other Topics Concern   Not on file   Social History Narrative   Pt lives alone; does not drive   Social Determinants of Health   Financial Resource Strain: Low Risk  (08/26/2022)   Overall Financial Resource Strain (CARDIA)    Difficulty of Paying Living Expenses: Not hard at all  Food Insecurity: No Food Insecurity (08/26/2022)   Hunger Vital Sign    Worried About Running Out of Food in the Last Year: Never true    [place] in the Last Year: Never true  Transportation Needs: No Transportation Needs (08/26/2022)   PRAPARE - Hydrologist (Medical): No    Lack of Transportation (Non-Medical): No  Physical Activity: Inactive (08/18/2021)   Exercise Vital Sign    Days of Exercise per Week: 0 days    Minutes of Exercise per Session: 0 min  Stress: No Stress Concern Present (08/26/2022)   [place]    Feeling of Stress : Not at all  Social Connections: Moderately Isolated (08/26/2022)   Social Connection and Isolation Panel [NHANES]    Frequency of Communication with Friends and Family: More than three times a week    Frequency of Social Gatherings with Friends and Family: Once a week    Attends Religious Services: 1 to 4 times per year    Active Member of [employer] or Organizations: No    Attends Archivist Meetings: Never    Marital Status: Widowed     Family History: The patient's family history includes Cerebral aneurysm in her father; Diabetes in her mother.  ROS:   Please see the history of present illness.    All other systems reviewed and are negative.  EKGs/Labs/Other Studies Reviewed:    The following studies were reviewed today:  06/09/2022 echo EF 55 RV normal Dilated LA Mild MR Moderate TR Mild AI.   06/09/2022 zio   The patient was monitored for 14 days.   The predominant rhythm was sinus with an average rate of 78 bpm (range 52-90 bpm in sinus).  Underlying intraventricular conduction  delay noted.   There were occasional PACs and rare PVCs.   1955 supraventricular runs were observed, lasting up to 1 hour and 21 minutes.  Maximal rate of SVT was 218 bpm.   There were no patient triggered events.   EKG:  The ekg ordered today demonstrates sinus rhythm and a single PAC   Recent Labs: 01/17/2022: ALT 5; BUN 17; Creatinine, Ser 0.62; Potassium 4.4; Sodium 137 05/09/2022: Magnesium 1.7; TSH 1.800  Recent Lipid Panel    Component Value Date/Time   CHOL 170 01/17/2022 1058   TRIG 57 01/17/2022 1058   HDL 62 01/17/2022 1058  CHOLHDL 2.2 07/23/2019 0946   LDLCALC 97 01/17/2022 1058    Physical Exam:    VS:  BP (!) 142/86   Pulse (!) 56   Ht 5\' 3"  (1.6 m)   Wt 172 lb 12.8 oz (78.4 kg)   SpO2 97%   BMI 30.61 kg/m     Wt Readings from Last 3 Encounters:  09/07/22 172 lb 12.8 oz (78.4 kg)  08/10/22 168 lb (76.2 kg)  07/12/22 168 lb (76.2 kg)     GEN:  Well nourished, well developed in no acute distress.  Elderly.  In wheelchair HEENT: Normal NECK: No JVD; No carotid bruits LYMPHATICS: No lymphadenopathy CARDIAC: RRR, no murmurs, rubs, gallops RESPIRATORY:  Clear to auscultation without rales, wheezing or rhonchi  ABDOMEN: Soft, non-tender, non-distended MUSCULOSKELETAL: Trivial bilateral pitting edema in the feet.; No deformity  SKIN: Warm and dry NEUROLOGIC:  Alert and oriented x 3 PSYCHIATRIC:  Normal affect       ASSESSMENT:    1. PVC's (premature ventricular contractions)   2. PSVT (paroxysmal supraventricular tachycardia)   3. NSVT (nonsustained ventricular tachycardia) (HCC)    PLAN:    In order of problems listed above:  #PVCs/PACs/SVT Thankfully she is asymptomatic with her PACs and salvos of SVT.  I have recommended increasing her metoprolol tartrate to 25 mg by mouth twice daily.  I do not think antiarrhythmic therapy is indicated at this time given the risk/benefit balance.  This was discussed in detail with the patient who is in  agreement.  #Feet swelling I suspect this is venous insufficiency.  I recommended compression stockings.  Follow-up with me on an as-needed basis.    Medication Adjustments/Labs and Tests Ordered: Current medicines are reviewed at length with the patient today.  Concerns regarding medicines are outlined above.  Orders Placed This Encounter  Procedures   EKG 12-Lead   Meds ordered this encounter  Medications   metoprolol tartrate (LOPRESSOR) 25 MG tablet    Sig: Take 1 tablet (25 mg total) by mouth 2 (two) times daily.    Dispense:  180 tablet    Refill:  3     Signed, Tashi Band T. 07/14/22, MD, [facility], [facility] 09/07/2022 1:45 PM    Electrophysiology [facility]

## 2022-09-07 NOTE — Patient Instructions (Signed)
Medication Instructions:  Increase Metoprolol tartrate to 25 mg two times daily *If you need a refill on your cardiac medications before your next appointment, please call your pharmacy*   Lab Work: None If you have labs (blood work) drawn today and your tests are completely normal, you will receive your results only by: DISH (if you have MyChart) OR A paper copy in the mail If you have any lab test that is abnormal or we need to change your treatment, we will call you to review the results.   Testing/Procedures: none  Follow-Up: At [facility], you and your health needs are our priority.  As part of our continuing mission to provide you with exceptional heart care, we have created designated Provider Care Teams.  These Care Teams include your primary Cardiologist (physician) and Advanced Practice Providers (APPs -  Physician Assistants and Nurse Practitioners) who all work together to provide you with the care you need, when you need it.  We recommend signing up for the patient portal called "MyChart".  Sign up information is provided on this After Visit Summary.  MyChart is used to connect with patients for Virtual Visits (Telemedicine).  Patients are able to view lab/test results, encounter notes, upcoming appointments, etc.  Non-urgent messages can be sent to your provider as well.   To learn more about what you can do with MyChart, go to [email or web address].    Your next appointment:   Follow up with Dr. Quentin Ore as needed.  Other Instructions Recommend wearing compression stocking for leg swelling.   Important Information About Sugar

## 2022-09-16 ENCOUNTER — Encounter: Payer: Self-pay | Admitting: Family Medicine

## 2022-09-16 ENCOUNTER — Ambulatory Visit (INDEPENDENT_AMBULATORY_CARE_PROVIDER_SITE_OTHER): Payer: Medicare Other | Admitting: Family Medicine

## 2022-09-16 ENCOUNTER — Other Ambulatory Visit
Admission: RE | Admit: 2022-09-16 | Discharge: 2022-09-16 | Disposition: A | Discharge status: Home / Self Care | Payer: Medicare Other | Attending: Family Medicine | Admitting: Family Medicine

## 2022-09-16 VITALS — BP 126/74 | HR 50 | Ht 63.0 in | Wt 170.0 lb

## 2022-09-16 DIAGNOSIS — J3089 Other allergic rhinitis: Secondary | ICD-10-CM | POA: Diagnosis not present

## 2022-09-16 DIAGNOSIS — I872 Venous insufficiency (chronic) (peripheral): Secondary | ICD-10-CM

## 2022-09-16 DIAGNOSIS — E119 Type 2 diabetes mellitus without complications: Secondary | ICD-10-CM | POA: Insufficient documentation

## 2022-09-16 DIAGNOSIS — I10 Essential (primary) hypertension: Secondary | ICD-10-CM | POA: Diagnosis not present

## 2022-09-16 DIAGNOSIS — K219 Gastro-esophageal reflux disease without esophagitis: Secondary | ICD-10-CM

## 2022-09-16 DIAGNOSIS — Z23 Encounter for immunization: Secondary | ICD-10-CM | POA: Diagnosis not present

## 2022-09-16 LAB — RENAL FUNCTION PANEL
Albumin: 3.3 g/dL — ABNORMAL LOW (ref 3.5–5.0)
Anion gap: 5 (ref 5–15)
BUN: 18 mg/dL (ref 8–23)
CO2: 24 mmol/L (ref 22–32)
Calcium: 8.9 mg/dL (ref 8.9–10.3)
Chloride: 107 mmol/L (ref 98–111)
Creatinine, Ser: 0.51 mg/dL (ref 0.44–1.00)
GFR, Estimated: >60 mL/min
Glucose, Bld: 101 mg/dL — ABNORMAL HIGH (ref 70–99)
Phosphorus: 3.7 mg/dL (ref 2.5–4.6)
Potassium: 4.1 mmol/L (ref 3.5–5.1)
Sodium: 136 mmol/L (ref 135–145)

## 2022-09-16 LAB — HEMOGLOBIN A1C
Hgb A1c MFr Bld: 6.1 % — ABNORMAL HIGH (ref 4.8–5.6)
Mean Plasma Glucose: 128.37 mg/dL

## 2022-09-16 MED ORDER — METFORMIN HCL 500 MG PO TABS: 500.0000 mg | ORAL_TABLET | Freq: Two times a day (BID) | ORAL | 1 refills | Status: DC

## 2022-09-16 MED ORDER — GLIPIZIDE ER 2.5 MG PO TB24: 2.5000 mg | ORAL_TABLET | Freq: Every day | ORAL | 1 refills | Status: DC

## 2022-09-16 MED ORDER — OMEPRAZOLE 20 MG PO CPDR: 20.0000 mg | DELAYED_RELEASE_CAPSULE | Freq: Every day | ORAL | 1 refills | Status: DC

## 2022-09-16 MED ORDER — FUROSEMIDE 20 MG PO TABS: 20.0000 mg | ORAL_TABLET | Freq: Every day | ORAL | 5 refills | Status: DC

## 2022-09-16 MED ORDER — MONTELUKAST SODIUM 10 MG PO TABS: 10.0000 mg | ORAL_TABLET | Freq: Every day | ORAL | 1 refills | Status: DC

## 2022-09-16 NOTE — Patient Instructions (Signed)

## 2022-09-16 NOTE — Progress Notes (Signed)
Date:  09/16/2022   Name:  [name]   DOB:  [birth year]   MRN:  [id number]   Chief Complaint: Gastroesophageal Reflux, Allergic Rhinitis , Diabetes, and Edema  Gastroesophageal Reflux She reports no abdominal pain, no belching, no chest pain, no choking, no coughing, no dysphagia, no early satiety, no globus sensation, no heartburn, no hoarse voice, no nausea, no sore throat, no stridor, no water brash or no wheezing. This is a chronic problem. The current episode started more than 1 year ago. The problem has been gradually improving. The symptoms are aggravated by certain foods. Pertinent negatives include no fatigue, melena or orthopnea. She has tried a PPI for the symptoms. The treatment provided moderate relief.  Diabetes She presents for her follow-up diabetic visit. She has type 2 diabetes mellitus. Her disease course has been stable. Pertinent negatives for diabetes include no chest pain, no fatigue, no polydipsia and no polyuria. Symptoms are stable. Current diabetic treatment includes oral agent (dual therapy). Her weight is stable. She is following a generally healthy diet. Meal planning includes avoidance of concentrated sweets and carbohydrate counting. An ACE inhibitor/angiotensin II receptor blocker is being taken.  URI  Pertinent negatives include no abdominal pain, chest pain, coughing, nausea, rhinorrhea, sore throat or wheezing. The treatment provided mild relief.    Lab Results  Component Value Date   NA 137 01/17/2022   K 4.4 01/17/2022   CO2 21 01/17/2022   GLUCOSE 119 (H) 01/17/2022   BUN 17 01/17/2022   CREATININE 0.62 01/17/2022   CALCIUM 9.1 01/17/2022   EGFR 86 01/17/2022   GFRNONAA >60 04/13/2021   Lab Results  Component Value Date   CHOL 170 01/17/2022   HDL 62 01/17/2022   LDLCALC 97 01/17/2022   TRIG 57 01/17/2022   CHOLHDL 2.2 07/23/2019   Lab Results  Component Value Date   TSH 1.800 05/09/2022   Lab Results  Component Value Date    HGBA1C 6.5 (H) 01/17/2022   Lab Results  Component Value Date   WBC 5.4 04/13/2021   HGB 10.6 (L) 04/13/2021   HCT 33.3 (L) 04/13/2021   MCV 84.3 04/13/2021   PLT 215 04/13/2021   Lab Results  Component Value Date   ALT 5 01/17/2022   AST 11 01/17/2022   ALKPHOS 85 01/17/2022   BILITOT 0.3 01/17/2022   No results found for: "25OHVITD2", "25OHVITD3", "VD25OH"   Review of Systems  Constitutional:  Negative for fatigue.  HENT:  Negative for hoarse voice, rhinorrhea, sinus pressure and sore throat.   Respiratory:  Negative for cough, choking and wheezing.   Cardiovascular:  Negative for chest pain.  Gastrointestinal:  Negative for abdominal pain, dysphagia, heartburn, melena and nausea.  Endocrine: Negative for polydipsia and polyuria.    Patient Active Problem List   Diagnosis Date Noted   Valvular heart disease 07/23/2021   Chronic pain of right knee 06/17/2021   Chronic pain syndrome 06/17/2021   Near syncope 04/12/2021   Pain due to total right knee replacement ([place]) 03/02/2021   Aortic valve regurgitation 10/23/2020   Bradycardia 09/04/2019   PVC's (premature ventricular contractions) 09/04/2019   Leg edema 09/04/2019   Sinus bradycardia 03/16/2018   PSVT (paroxysmal supraventricular tachycardia) 03/16/2018   NSVT (nonsustained ventricular tachycardia) ([place]) 03/16/2018   Malignant HTN with heart disease, w/o CHF, w/o chronic kidney disease 02/05/2018   Hyperlipidemia 09/24/2015   Diabetes mellitus with no complication ([place]) 92/33/0076   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  Arthritis of knee, degenerative 03/16/2015   Sinus infection 03/16/2015   Diabetes ([place])    Essential hypertension    GERD (gastroesophageal reflux disease)    History of bilateral knee replacement     Allergies  Allergen Reactions   Nsaids    Lisinopril     Cough    Past Surgical History:  Procedure Laterality Date   BACK SURGERY     CATARACT EXTRACTION  Bilateral 03/2016   CERVICAL [place] SURGERY  12/28/2010   "bulging disc; went in on the left side of my neck" (08/19/2013)   JOINT REPLACEMENT     TOTAL KNEE ARTHROPLASTY Left 03/28/2011   Dr Noemi Chapel   TOTAL KNEE ARTHROPLASTY Right 08/19/2013   TOTAL KNEE ARTHROPLASTY Right 08/19/2013   Procedure: TOTAL KNEE ARTHROPLASTY;  Surgeon: Lorn Junes, MD;  Location: [facility];  Service: Orthopedics;  Laterality: Right;    Social History   Tobacco Use   Smoking status: Never   Smokeless tobacco: Never   Tobacco comments:    Smoking cessation materials not required  Vaping Use   Vaping Use: Never used  Substance Use Topics   Alcohol use: Not Currently   Drug use: Never     Medication list has been reviewed and updated.  Current Meds  Medication Sig   benzonatate (TESSALON) 100 MG capsule Take 1 capsule (100 mg total) by mouth 2 (two) times daily as needed for cough.   cyanocobalamin 100 MCG tablet Take 1 tablet by mouth daily.    docusate sodium (COLACE) 50 MG capsule Take 1 capsule (50 mg total) by mouth 2 (two) times daily.   furosemide (LASIX) 20 MG tablet Take 1 tablet (20 mg total) by mouth daily. Decrease dosing to three times a week   glipiZIDE (GLUCOTROL XL) 2.5 MG 24 hr tablet Take 1 tablet by mouth once daily with breakfast   glucose blood (ONE TOUCH ULTRA TEST) test strip USE 1 STRIP TO CHECK GLUCOSE ONCE DAILY   loratadine (CLARITIN) 10 MG tablet Take 1 tablet (10 mg total) by mouth daily.   metFORMIN (GLUCOPHAGE) 500 MG tablet TAKE 1 TABLET BY MOUTH TWICE DAILY WITH MEALS   metoprolol tartrate (LOPRESSOR) 25 MG tablet Take 1 tablet (25 mg total) by mouth 2 (two) times daily.   mometasone (NASONEX) 50 MCG/ACT nasal spray Use 2 spray(s) in each nostril once daily   montelukast (SINGULAIR) 10 MG tablet Take 1 tablet (10 mg total) by mouth at bedtime.   omeprazole (PRILOSEC) 20 MG capsule Take 1 capsule (20 mg total) by mouth daily.   triamcinolone cream (KENALOG) 0.1 % Apply 1  Application topically 2 (two) times daily.       08/10/2022   11:24 AM 07/12/2022    3:09 PM 05/06/2022    2:00 PM 09/14/2021    1:24 PM  GAD 7 : Generalized Anxiety Score  Nervous, Anxious, on Edge 0 0  0  Control/stop worrying 0 0 0 0  Worry too much - different things 0 0 0 0  Trouble relaxing 0 0 0 0  Restless 0 0 0 0  Easily annoyed or irritable 0 0 0 0  Afraid - awful might happen 0 0 0 0  Total GAD 7 Score 0 0  0  Anxiety Difficulty Not difficult at all Not difficult at all Not difficult at all        08/26/2022   11:05 AM 08/10/2022   11:23 AM 07/12/2022    3:09 PM  Depression  screen PHQ 2/9  Decreased Interest 0 0 0  Down, Depressed, Hopeless 0 0 0  PHQ - 2 Score 0 0 0  Altered sleeping  0 0  Tired, decreased energy  0 2  Change in appetite  0 0  Feeling bad or failure about yourself   0 0  Trouble concentrating  0 0  Moving slowly or fidgety/restless  0 0  Suicidal thoughts  0 0  PHQ-9 Score  0 2  Difficult doing work/chores  Not difficult at all Very difficult    BP Readings from Last 3 Encounters:  09/16/22 126/74  09/07/22 (!) 142/86  08/10/22 130/62    Physical Exam HENT:     Head: Normocephalic.     Right Ear: Tympanic membrane normal.     Left Ear: Tympanic membrane normal.     Nose: Nose normal.     Mouth/Throat:     Mouth: Mucous membranes are moist.  Eyes:     Extraocular Movements: Extraocular movements intact.     Conjunctiva/sclera: Conjunctivae normal.     Pupils: Pupils are equal, round, and reactive to light.  Cardiovascular:     Rate and Rhythm: Normal rate.     Pulses: Normal pulses.     Heart sounds: Normal heart sounds. No murmur heard.    No gallop.  Pulmonary:     Breath sounds: No wheezing or rhonchi.  Abdominal:     General: Bowel sounds are normal.     Tenderness: There is no guarding.     Wt Readings from Last 3 Encounters:  09/16/22 170 lb (77.1 kg)  09/07/22 172 lb 12.8 oz (78.4 kg)  08/10/22 168 lb (76.2 kg)     BP 126/74   Pulse (!) 50   Ht _0  (1.6 m)   Wt 170 lb (77.1 kg)   SpO2 94%   BMI 30.11 kg/m   Assessment and Plan:  1. Type 2 diabetes mellitus without complication, without long-term current use of insulin (HCC) Chronic.  Controlled.  Stable.  Continue glipizide 2.5 mg XL once a day.  We will also continue metformin 500 mg twice a day.  We will check A1c and renal function panel. - glipiZIDE (GLUCOTROL XL) 2.5 MG 24 hr tablet; Take 1 tablet (2.5 mg total) by mouth daily with breakfast.  Dispense: 90 tablet; Refill: 1 - metFORMIN (GLUCOPHAGE) 500 MG tablet; Take 1 tablet (500 mg total) by mouth 2 (two) times daily with a meal.  Dispense: 180 tablet; Refill: 1 - HgB A1c - Renal Function Panel  2. Gastroesophageal reflux disease .  Controlled.  Stable.  Continue omeprazole 20 mg once a day. - omeprazole (PRILOSEC) 20 MG capsule; Take 1 capsule (20 mg total) by mouth daily.  Dispense: 90 capsule; Refill: 1  3. Venous insufficiency Chronic.  Controlled.  Stable.  Continue furosemide 20 mg once a day for the most part but may go up to 3 times a day as needed. - furosemide (LASIX) 20 MG tablet; Take 1 tablet (20 mg total) by mouth daily. Decrease dosing to three times a week  Dispense: 30 tablet; Refill: 5 - Renal Function Panel  4. Non-seasonal allergic rhinitis, unspecified trigger We will check for allergy control with Singulair 10 mg once a day primarily taken at bedtime. - montelukast (SINGULAIR) 10 MG tablet; Take 1 tablet (10 mg total) by mouth at bedtime.  Dispense: 90 tablet; Refill: 1  5. Stasis dermatitis of both legs Stasis dermatitis which we help along  with elevation of the legs is well as furosemide 20 mg as needed daily. - furosemide (LASIX) 20 MG tablet; Take 1 tablet (20 mg total) by mouth daily. Decrease dosing to three times a week  Dispense: 30 tablet; Refill: 5  6. Need for pneumococcal vaccination Discussed and administered. - Pneumococcal conjugate  vaccine 20-valent (Prevnar 20)    Otilio Miu, MD

## 2022-09-30 DIAGNOSIS — M179 Osteoarthritis of knee, unspecified: Secondary | ICD-10-CM | POA: Diagnosis not present

## 2022-10-19 ENCOUNTER — Encounter: Payer: Self-pay | Admitting: Student in an Organized Health Care Education/Training Program

## 2022-10-19 ENCOUNTER — Ambulatory Visit
Payer: Medicare Other | Attending: Student in an Organized Health Care Education/Training Program | Admitting: Student in an Organized Health Care Education/Training Program

## 2022-10-19 DIAGNOSIS — T8484XS Pain due to internal orthopedic prosthetic devices, implants and grafts, sequela: Secondary | ICD-10-CM | POA: Diagnosis not present

## 2022-10-19 DIAGNOSIS — Z96653 Presence of artificial knee joint, bilateral: Secondary | ICD-10-CM | POA: Diagnosis not present

## 2022-10-19 DIAGNOSIS — Z96652 Presence of left artificial knee joint: Secondary | ICD-10-CM

## 2022-10-19 DIAGNOSIS — G894 Chronic pain syndrome: Secondary | ICD-10-CM

## 2022-10-19 DIAGNOSIS — G8929 Other chronic pain: Secondary | ICD-10-CM

## 2022-10-19 MED ORDER — TIZANIDINE HCL 2 MG PO TABS: 2.0000 mg | ORAL_TABLET | Freq: Every evening | ORAL | 0 refills | Status: AC | PRN

## 2022-10-19 NOTE — Progress Notes (Signed)
Patient: [name]  Service Category: E/M  Provider: Gillis Santa, MD  DOB: 05-06-1932  DOS: 10/19/2022  Location: Office  MRN: [id number]  Setting: Ambulatory outpatient  Referring Provider: Juline Patch, MD  Type: Established Patient  Specialty: Interventional Pain Management  PCP: Juline Patch, MD  Location: Remote location  Delivery: TeleHealth     Virtual Encounter - Pain Management PROVIDER NOTE: Information contained herein reflects review and annotations entered in association with encounter. Interpretation of such information and data should be left to medically-trained personnel. Information provided to patient can be located elsewhere in the medical record under "Patient Instructions". Document created using STT-dictation technology, any transcriptional errors that may result from process are unintentional.    Contact & Pharmacy Preferred: [phone] Home: [phone] (home) Mobile: [phone] (mobile) E-mail: No e-mail address on record  [place], [place] - [address] [place] [address] Phone: [phone] Fax: [phone]  [id number] [place] [place]: [phone]: [phone]  Pre-screening  Ms. Poorman offered "in-person" vs "virtual" encounter. She indicated preferring virtual for this encounter.   Reason COVID-19*  Social distancing based on CDC and AMA recommendations.   I contacted DKerri Percheson 10/19/2022 via telephone.      I clearly identified myself as BGillis Santa MD. I verified that I was speaking with the correct person using two identifiers (Name: DBELEM HINTZE and date of birth: 221-Aug-1933.  Consent I sought verbal advanced consent from [name] virtual visit interactions. I informed Ms. CRingleof possible security and privacy concerns, risks, and limitations associated with providing  "not-in-person" medical evaluation and management services. I also informed Ms. CAlongeof the availability of "in-person" appointments. Finally, I informed her that there would be a charge for the virtual visit and that she could be  personally, fully or partially, financially responsible for it. Ms. CLoprestiexpressed understanding and agreed to proceed.   Historic Elements   Ms. DSIMORA DINGEEis a 88y.o. year old, female patient evaluated today after our last contact on 05/17/2022. Ms. CAstle has a past medical history of GERD (gastroesophageal reflux disease), Hypertension, Right knee DJD, and Type II diabetes mellitus ([place]. She also  has a past surgical history that includes Total knee arthroplasty (Left, 03/28/2011); Cervical disc surgery (12/28/2010); Total knee arthroplasty (Right, 08/19/2013); Joint replacement; Back surgery; Total knee arthroplasty (Right, 08/19/2013); and Cataract extraction (Bilateral, 03/2016). Ms. CSlivkahas a current medication list which includes the following prescription(s): benzonatate, cyanocobalamin, docusate sodium, furosemide, glipizide, glucose blood, loratadine, metformin, metoprolol tartrate, mometasone, montelukast, omeprazole, tizanidine, and triamcinolone cream. She  reports that she has never smoked. She has never used smokeless tobacco. She reports that she does not currently use alcohol. She reports that she does not use drugs. Ms. CClemsonis allergic to nsaids and lisinopril.  Estimated body mass index is 30.11 kg/m as calculated from the following:   Height as of 09/16/22: _0  (1.6 m).   Weight as of 09/16/22: 170 lb (77.1 kg).  HPI  Today, she is being contacted for worsening of previously known (established) problem  Increased bilateral knee pain and stiffness S/P 05/17/22 genicular nerve block which only provided relief for 3-4 weeks Is not interested in repeating Discussed very low dose muscle relaxer to be taken at night only  Cautioned on the  risk of sedation, drowsiness, and balance  issues with centrally acting meds like Tizanidine Will start with low dose and PRN only to see if it helps with her knee stiffness and pain  Laboratory Chemistry Profile   Renal Lab Results  Component Value Date   BUN 18 09/16/2022   CREATININE 0.51 09/16/2022   BCR 27 01/17/2022   GFRAA 91 03/18/2020   GFRNONAA >60 09/16/2022    Hepatic Lab Results  Component Value Date   AST 11 01/17/2022   ALT 5 01/17/2022   ALBUMIN 3.3 (L) 09/16/2022   ALKPHOS 85 01/17/2022    Electrolytes Lab Results  Component Value Date   NA 136 09/16/2022   K 4.1 09/16/2022   CL 107 09/16/2022   CALCIUM 8.9 09/16/2022   MG 1.7 05/09/2022   PHOS 3.7 09/16/2022    Bone No results found for: "VD25OH", "VD125OH2TOT", "FY9244QK8", "MN8177NH6", "25OHVITD1", "25OHVITD2", "25OHVITD3", "TESTOFREE", "TESTOSTERONE"  Inflammation (CRP: Acute Phase) (ESR: Chronic Phase) No results found for: "CRP", "ESRSEDRATE", "LATICACIDVEN"       Note: Above Lab results reviewed.  Imaging  LONG TERM MONITOR (3-14 DAYS)   The patient was monitored for 14 days.   The predominant rhythm was sinus with an average rate of 78 bpm (range  52-90 bpm in sinus).  Underlying intraventricular conduction delay noted.   There were occasional PACs and rare PVCs.   1955 supraventricular runs were observed, lasting up to 1 hour and 21  minutes.  Maximal rate of SVT was 218 bpm.   There were no patient triggered events.  Sinus rhythm with occasional PACs and plethora of supraventricular runs  lasting up to 1 hour and 21 minutes.  Assessment  The primary encounter diagnosis was History of bilateral knee replacement. Diagnoses of Pain due to total left knee replacement, sequela, Chronic pain of left knee, Pain due to total right knee replacement, sequela, Chronic pain of right knee, and Chronic pain syndrome were also pertinent to this visit.  Plan of Care   Ms. [name] has a current  medication list which includes the following long-term medication(s): furosemide, glipizide, loratadine, metformin, metoprolol tartrate, mometasone, montelukast, and omeprazole.  Pharmacotherapy (Medications Ordered): Meds ordered this encounter  Medications   tiZANidine (ZANAFLEX) 2 MG tablet    Sig: Take 1 tablet (2 mg total) by mouth at bedtime as needed for muscle spasms.    Dispense:  90 tablet    Refill:  0    Do not place this medication, or any other prescription from our practice, on "Automatic Refill". Patient may have prescription filled one day early if pharmacy is closed on scheduled refill date.   Orders:  No orders of the defined types were placed in this encounter.  Follow-up plan:   No follow-ups on file.     Left knee genicular NB 06/30/21, 08/11/21; RFA 10/06/21. B/L GNB 04/04/22      Recent Visits No visits were found meeting these conditions. Showing recent visits within past 90 days and meeting all other requirements Today's Visits Date Type Provider Dept  10/19/22 Office Visit Gillis Santa, MD [facility]  Showing today's visits and meeting all other requirements Future Appointments No visits were found meeting these conditions. Showing future appointments within next 90 days and meeting all other requirements  I discussed the assessment and treatment plan with the patient. The patient was provided an opportunity to ask questions and all were answered. The patient agreed with the plan and demonstrated an understanding of the instructions.  Patient advised to  call back or seek an in-person evaluation if the symptoms or condition worsens.  Duration of encounter: 11mnutes.  Note by: BGillis Santa MD Date: 10/19/2022; Time: 2:24 PM

## 2022-10-20 ENCOUNTER — Other Ambulatory Visit: Payer: Self-pay

## 2022-10-20 DIAGNOSIS — I872 Venous insufficiency (chronic) (peripheral): Secondary | ICD-10-CM

## 2022-10-20 MED ORDER — TRIAMCINOLONE ACETONIDE 0.1 % EX CREA: 1.0000 | TOPICAL_CREAM | Freq: Two times a day (BID) | CUTANEOUS | 0 refills | Status: DC

## 2022-10-30 DIAGNOSIS — M179 Osteoarthritis of knee, unspecified: Secondary | ICD-10-CM | POA: Diagnosis not present

## 2022-11-10 IMAGING — CR DG CHEST 2V
2 series · 2 of 2 positions shown · non-contrast
Comparison: 02/05/2018

CLINICAL DATA: Persistent cough

EXAM:
CHEST - 2 VIEW

[chest lat]
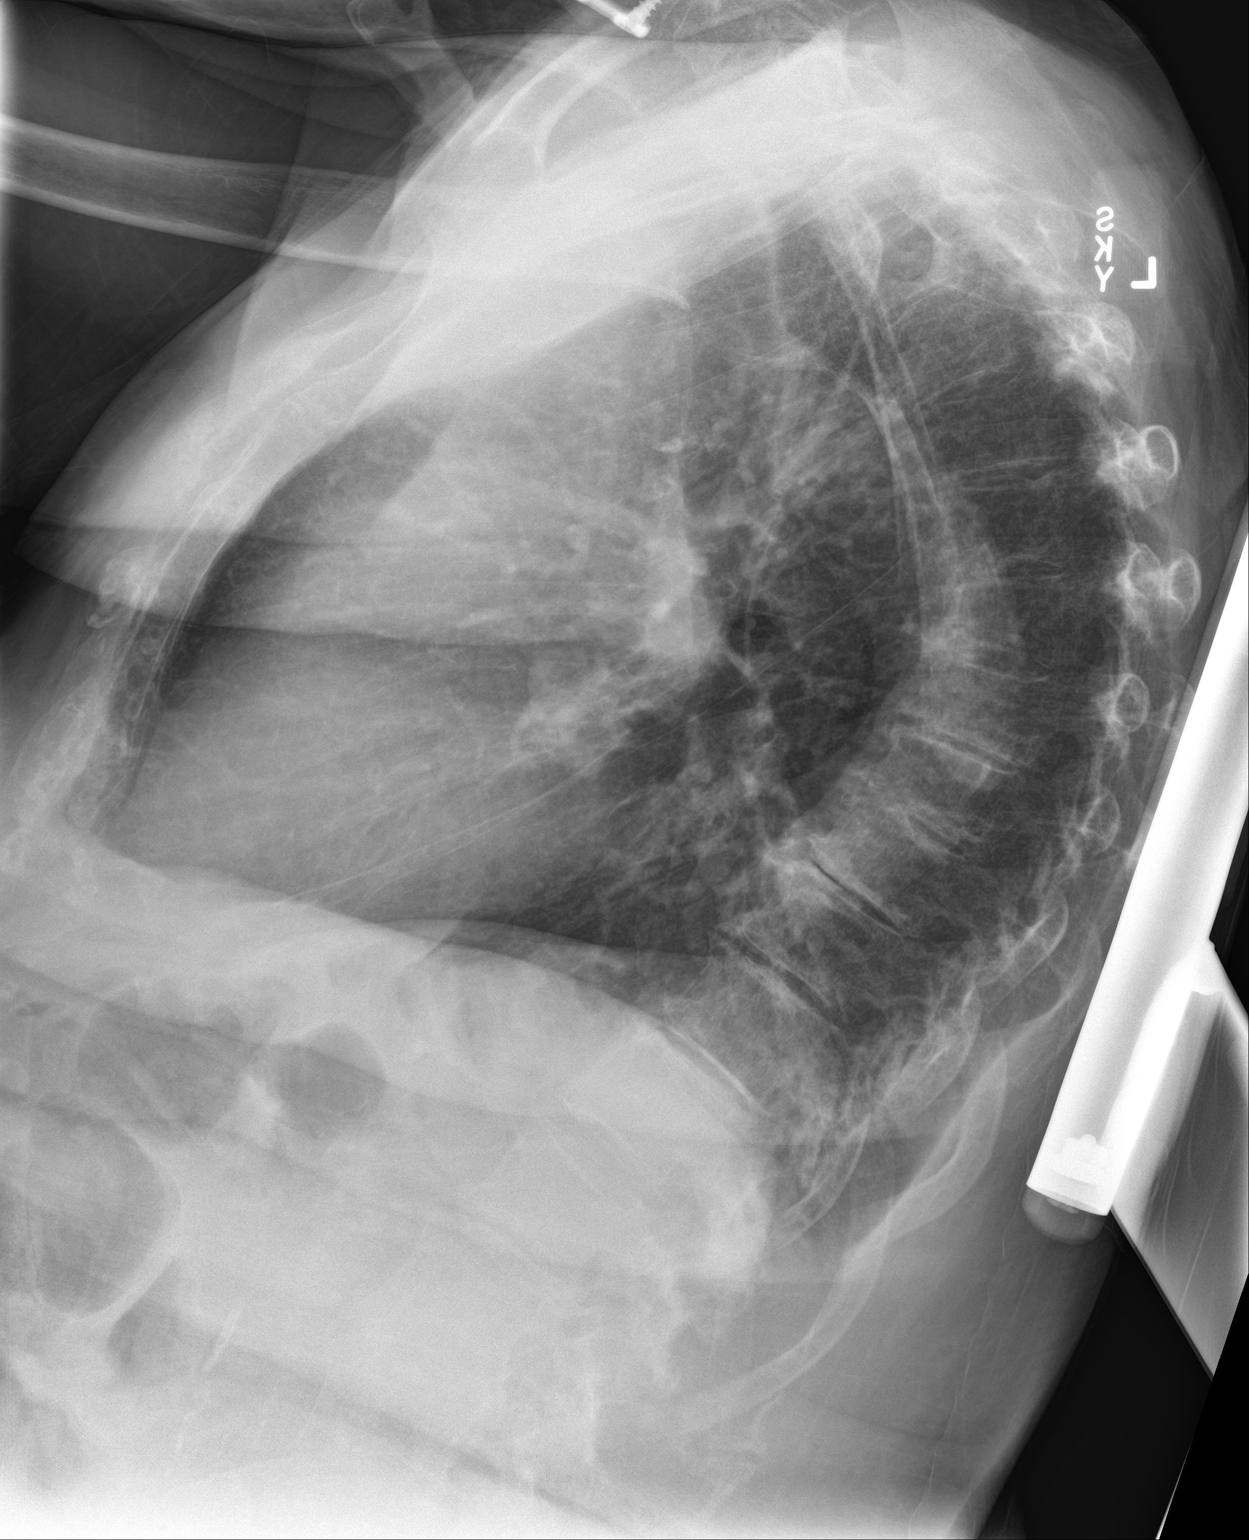

[chest ap]
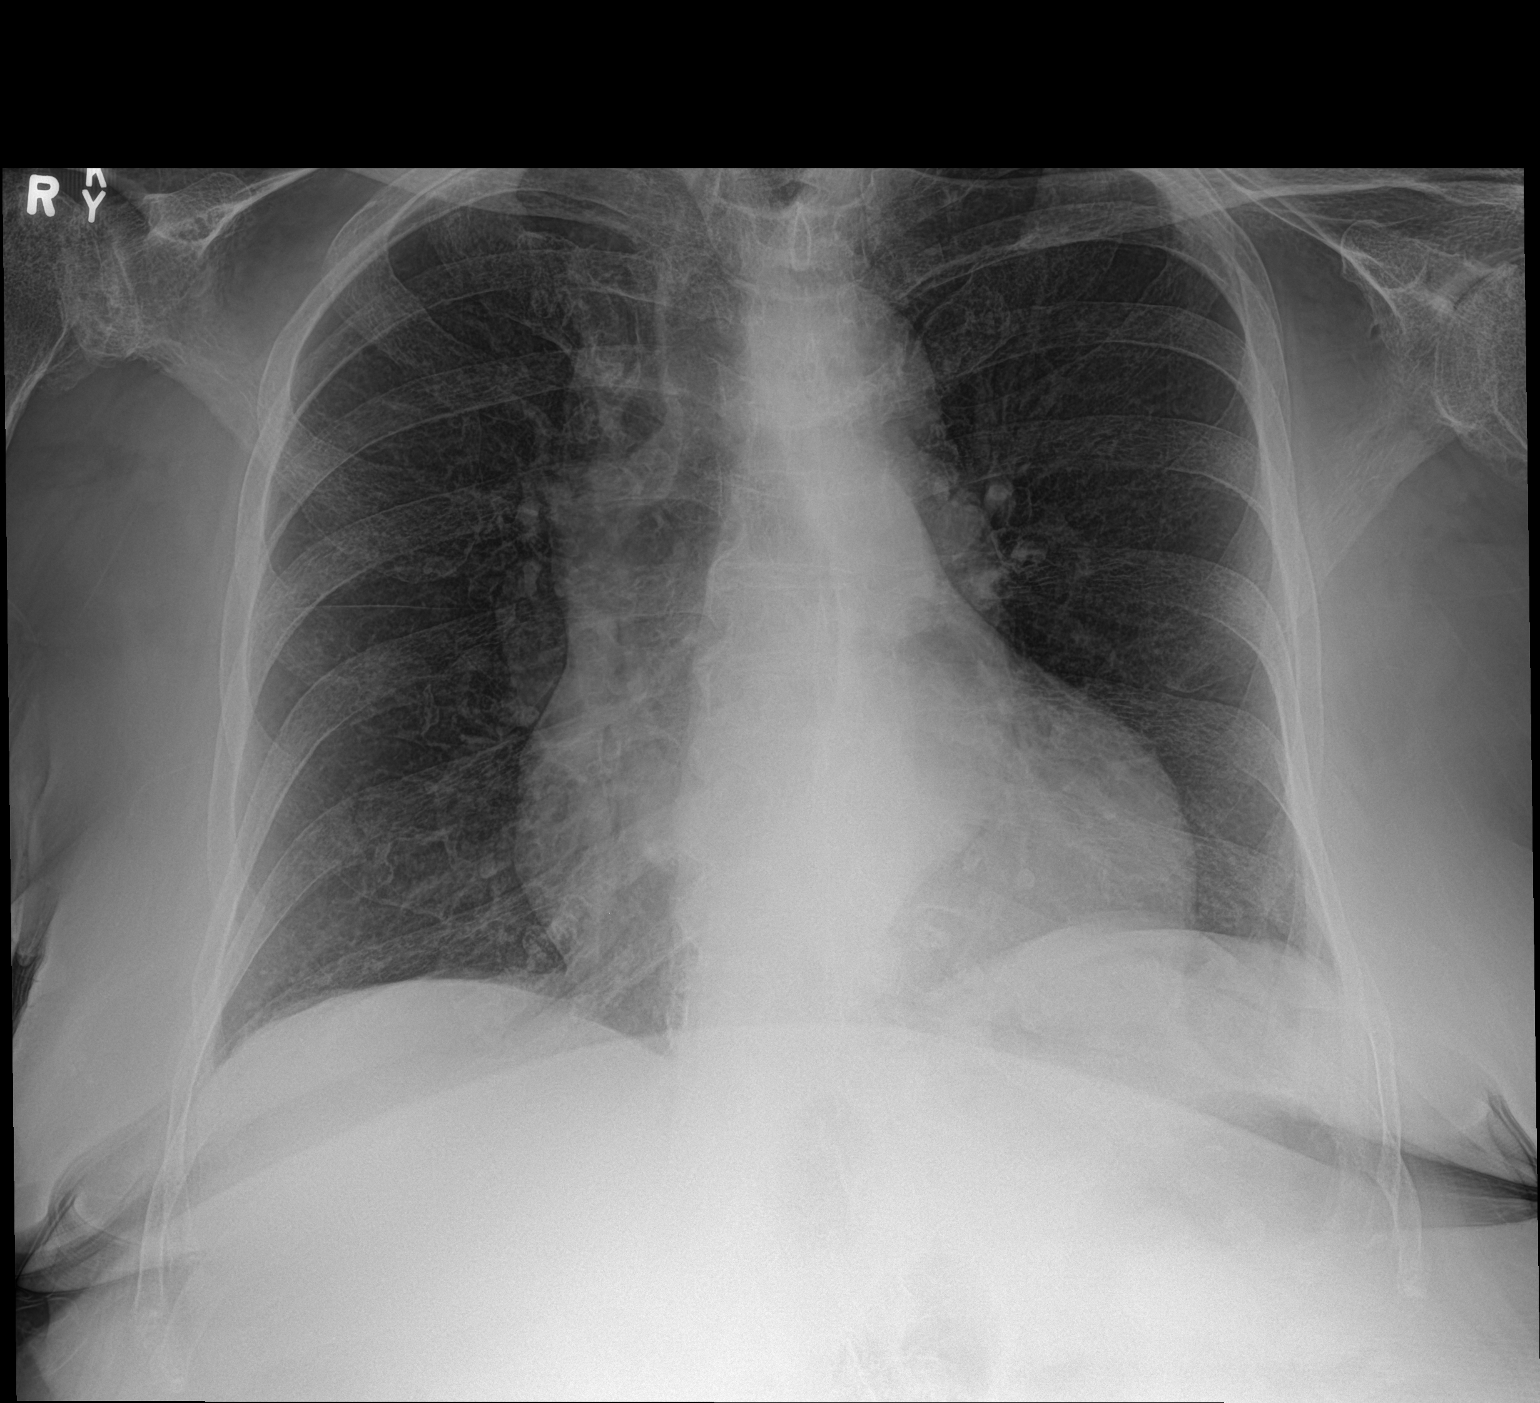

[2 of 2 positions shown; findings below may reference images not displayed]

FINDINGS: Normal mediastinum and cardiac silhouette. Normal pulmonary
vasculature. No evidence of effusion, infiltrate, or pneumothorax.
No acute bony abnormality.
IMPRESSION: No acute cardiopulmonary process.

## 2022-11-28 ENCOUNTER — Ambulatory Visit
Admission: RE | Admit: 2022-11-28 | Discharge: 2022-11-28 | Disposition: A | Discharge status: Home / Self Care | Payer: Medicare Other | Source: Ambulatory Visit | Attending: Family Medicine | Admitting: Family Medicine

## 2022-11-28 ENCOUNTER — Encounter: Payer: Self-pay | Admitting: Family Medicine

## 2022-11-28 ENCOUNTER — Ambulatory Visit (INDEPENDENT_AMBULATORY_CARE_PROVIDER_SITE_OTHER): Payer: Medicare Other | Admitting: Family Medicine

## 2022-11-28 ENCOUNTER — Inpatient Hospital Stay: Admission: RE | Admit: 2022-11-28 | Payer: Medicare Other | Source: Ambulatory Visit

## 2022-11-28 ENCOUNTER — Ambulatory Visit
Admission: RE | Admit: 2022-11-28 | Discharge: 2022-11-28 | Disposition: A | Discharge status: Home / Self Care | Payer: Medicare Other | Attending: Family Medicine | Admitting: Family Medicine

## 2022-11-28 VITALS — BP 120/68 | HR 90 | Temp 102.8°F | Ht 63.0 in | Wt 170.0 lb

## 2022-11-28 DIAGNOSIS — R5081 Fever presenting with conditions classified elsewhere: Secondary | ICD-10-CM

## 2022-11-28 DIAGNOSIS — R051 Acute cough: Secondary | ICD-10-CM

## 2022-11-28 DIAGNOSIS — I872 Venous insufficiency (chronic) (peripheral): Secondary | ICD-10-CM | POA: Diagnosis not present

## 2022-11-28 DIAGNOSIS — R509 Fever, unspecified: Secondary | ICD-10-CM | POA: Diagnosis not present

## 2022-11-28 DIAGNOSIS — U071 COVID-19: Secondary | ICD-10-CM | POA: Insufficient documentation

## 2022-11-28 LAB — POCT INFLUENZA A/B
Influenza A, POC: NEGATIVE
Influenza B, POC: NEGATIVE

## 2022-11-28 LAB — POC COVID19 BINAXNOW: SARS Coronavirus 2 Ag: POSITIVE — AB

## 2022-11-28 MED ORDER — BENZONATATE 100 MG PO CAPS: 100.0000 mg | ORAL_CAPSULE | Freq: Two times a day (BID) | ORAL | 0 refills | Status: DC | PRN

## 2022-11-28 MED ORDER — MOLNUPIRAVIR EUA 200MG CAPSULE: 4.0000 | ORAL_CAPSULE | Freq: Two times a day (BID) | ORAL | 0 refills | Status: AC

## 2022-11-28 MED ORDER — TRIAMCINOLONE ACETONIDE 0.1 % EX CREA: 1.0000 | TOPICAL_CREAM | Freq: Two times a day (BID) | CUTANEOUS | 0 refills | Status: DC

## 2022-11-28 NOTE — Progress Notes (Signed)
Date:  11/28/2022   Name:  [name]   DOB:  1932-05-14   MRN:  [id number]   Chief Complaint: skin itching and Cough (Low grade fever, weakness)  Cough This is a new problem. The current episode started in the past 7 days. The problem has been waxing and waning. The cough is Non-productive. Associated symptoms include chills, a fever and headaches. Pertinent negatives include no chest pain, myalgias, nasal congestion, postnasal drip, rhinorrhea, sore throat, shortness of breath or wheezing.    Lab Results  Component Value Date   NA 136 09/16/2022   K 4.1 09/16/2022   CO2 24 09/16/2022   GLUCOSE 101 (H) 09/16/2022   BUN 18 09/16/2022   CREATININE 0.51 09/16/2022   CALCIUM 8.9 09/16/2022   EGFR 86 01/17/2022   GFRNONAA >60 09/16/2022   Lab Results  Component Value Date   CHOL 170 01/17/2022   HDL 62 01/17/2022   LDLCALC 97 01/17/2022   TRIG 57 01/17/2022   CHOLHDL 2.2 07/23/2019   Lab Results  Component Value Date   TSH 1.800 05/09/2022   Lab Results  Component Value Date   HGBA1C 6.1 (H) 09/16/2022   Lab Results  Component Value Date   WBC 5.4 04/13/2021   HGB 10.6 (L) 04/13/2021   HCT 33.3 (L) 04/13/2021   MCV 84.3 04/13/2021   PLT 215 04/13/2021   Lab Results  Component Value Date   ALT 5 01/17/2022   AST 11 01/17/2022   ALKPHOS 85 01/17/2022   BILITOT 0.3 01/17/2022   No results found for: "25OHVITD2", "25OHVITD3", "VD25OH"   Review of Systems  Constitutional:  Positive for chills and fever.  HENT:  Negative for postnasal drip, rhinorrhea and sore throat.   Respiratory:  Positive for cough. Negative for shortness of breath and wheezing.   Cardiovascular:  Negative for chest pain, palpitations and leg swelling.  Endocrine: Negative for polydipsia and polyuria.  Musculoskeletal:  Negative for myalgias.  Neurological:  Positive for headaches.    Patient Active Problem List   Diagnosis Date Noted   Valvular heart disease 07/23/2021   Chronic  pain of right knee 06/17/2021   Chronic pain syndrome 06/17/2021   Near syncope 04/12/2021   Pain due to total right knee replacement ([place]) 03/02/2021   Aortic valve regurgitation 10/23/2020   Bradycardia 09/04/2019   PVC's (premature ventricular contractions) 09/04/2019   Leg edema 09/04/2019   Sinus bradycardia 03/16/2018   PSVT (paroxysmal supraventricular tachycardia) 03/16/2018   NSVT (nonsustained ventricular tachycardia) ([place]) 03/16/2018   Malignant HTN with heart disease, w/o CHF, w/o chronic kidney disease 02/05/2018   Hyperlipidemia 09/24/2015   Diabetes mellitus with no complication ([place]) 02/58/5277   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   Arthritis of knee, degenerative 03/16/2015   Sinus infection 03/16/2015   Diabetes ([place])    Essential hypertension    GERD (gastroesophageal reflux disease)    History of bilateral knee replacement     Allergies  Allergen Reactions   Nsaids    Lisinopril     Cough    Past Surgical History:  Procedure Laterality Date   BACK SURGERY     CATARACT EXTRACTION Bilateral 03/2016   CERVICAL [place] SURGERY  12/28/2010   "bulging disc; went in on the left side of my neck" (08/19/2013)   JOINT REPLACEMENT     TOTAL KNEE ARTHROPLASTY Left 03/28/2011   Dr Noemi Chapel   TOTAL KNEE ARTHROPLASTY Right 08/19/2013   TOTAL KNEE ARTHROPLASTY Right 08/19/2013  Procedure: TOTAL KNEE ARTHROPLASTY;  Surgeon: Nilda Simmer, MD;  Location: [facility] OR;  Service: Orthopedics;  Laterality: Right;    Social History   Tobacco Use   Smoking status: Never   Smokeless tobacco: Never   Tobacco comments:    Smoking cessation materials not required  Vaping Use   Vaping Use: Never used  Substance Use Topics   Alcohol use: Not Currently   Drug use: Never     Medication list has been reviewed and updated.  Current Meds  Medication Sig   cyanocobalamin 100 MCG tablet Take 1 tablet by mouth daily.    docusate sodium (COLACE) 50 MG capsule  Take 1 capsule (50 mg total) by mouth 2 (two) times daily.   furosemide (LASIX) 20 MG tablet Take 1 tablet (20 mg total) by mouth daily. Decrease dosing to three times a week   glipiZIDE (GLUCOTROL XL) 2.5 MG 24 hr tablet Take 1 tablet (2.5 mg total) by mouth daily with breakfast.   glucose blood (ONE TOUCH ULTRA TEST) test strip USE 1 STRIP TO CHECK GLUCOSE ONCE DAILY   loratadine (CLARITIN) 10 MG tablet Take 1 tablet (10 mg total) by mouth daily.   metFORMIN (GLUCOPHAGE) 500 MG tablet Take 1 tablet (500 mg total) by mouth 2 (two) times daily with a meal.   metoprolol tartrate (LOPRESSOR) 25 MG tablet Take 1 tablet (25 mg total) by mouth 2 (two) times daily.   mometasone (NASONEX) 50 MCG/ACT nasal spray Use 2 spray(s) in each nostril once daily   montelukast (SINGULAIR) 10 MG tablet Take 1 tablet (10 mg total) by mouth at bedtime.   omeprazole (PRILOSEC) 20 MG capsule Take 1 capsule (20 mg total) by mouth daily.   tiZANidine (ZANAFLEX) 2 MG tablet Take 1 tablet (2 mg total) by mouth at bedtime as needed for muscle spasms.   [DISCONTINUED] triamcinolone cream (KENALOG) 0.1 % Apply 1 Application topically 2 (two) times daily.       11/28/2022    2:20 PM 08/10/2022   11:24 AM 07/12/2022    3:09 PM 05/06/2022    2:00 PM  GAD 7 : Generalized Anxiety Score  Nervous, Anxious, on Edge 0 0 0   Control/stop worrying 0 0 0 0  Worry too much - different things 0 0 0 0  Trouble relaxing 0 0 0 0  Restless 0 0 0 0  Easily annoyed or irritable 0 0 0 0  Afraid - awful might happen 0 0 0 0  Total GAD 7 Score 0 0 0   Anxiety Difficulty Not difficult at all Not difficult at all Not difficult at all Not difficult at all       11/28/2022    2:20 PM 08/26/2022   11:05 AM 08/10/2022   11:23 AM  Depression screen PHQ 2/9  Decreased Interest 0 0 0  Down, Depressed, Hopeless 0 0 0  PHQ - 2 Score 0 0 0  Altered sleeping 0  0  Tired, decreased energy 0  0  Change in appetite 0  0  Feeling bad or failure about  yourself  0  0  Trouble concentrating 0  0  Moving slowly or fidgety/restless 0  0  Suicidal thoughts 0  0  PHQ-9 Score 0  0  Difficult doing work/chores Not difficult at all  Not difficult at all    BP Readings from Last 3 Encounters:  11/28/22 120/68  09/16/22 126/74  09/07/22 (!) 142/86    Physical Exam Vitals  and nursing note reviewed. Exam conducted with a chaperone present.  Constitutional:      General: She is not in acute distress.    Appearance: She is not diaphoretic.  HENT:     Head: Normocephalic and atraumatic.     Right Ear: Tympanic membrane and external ear normal.     Left Ear: Tympanic membrane and external ear normal.     Nose: Nose normal.     Mouth/Throat:     Mouth: Mucous membranes are moist.  Eyes:     General:        Right eye: No discharge.        Left eye: No discharge.     Conjunctiva/sclera: Conjunctivae normal.     Pupils: Pupils are equal, round, and reactive to light.  Neck:     Thyroid: No thyromegaly.     Vascular: No JVD.  Cardiovascular:     Rate and Rhythm: Normal rate and regular rhythm.     Heart sounds: Normal heart sounds. No murmur heard.    No friction rub. No gallop.  Pulmonary:     Effort: Pulmonary effort is normal.     Breath sounds: Normal breath sounds. No wheezing, rhonchi or rales.  Chest:     Chest wall: No tenderness.  Abdominal:     General: Bowel sounds are normal.     Palpations: Abdomen is soft. There is no mass.     Tenderness: There is no abdominal tenderness. There is no guarding or rebound.  Musculoskeletal:        General: Normal range of motion.     Cervical back: Normal range of motion and neck supple.  Lymphadenopathy:     Cervical: No cervical adenopathy.  Skin:    General: Skin is warm and dry.  Neurological:     Mental Status: She is alert.     Wt Readings from Last 3 Encounters:  11/28/22 170 lb (77.1 kg)  09/16/22 170 lb (77.1 kg)  09/07/22 172 lb 12.8 oz (78.4 kg)    BP 120/68    Pulse 90   Temp 98.8 F (37.1 C) (Oral)   Ht 5\' 3"  (1.6 m)   Wt 170 lb (77.1 kg)   SpO2 90%   BMI 30.11 kg/m   Assessment and Plan: 1. Fever in other diseases New onset began on Saturday with nonproductive cough fever chills myalgias point-of-care COVID/influenza A/B done patient is positive for COVID-19.  Temperature 102.8/pulse ox 96 - POCT Influenza A/B - POC COVID-19  2. COVID Onset of symptoms Saturday night.  Pulse ox 96%, temperature 102.8, no grunting or use of accessory muscles does not appear to be in respiratory distress but is short of breath.  We will obtain a chest x-ray to evaluate if pneumonia present given age and underlying medical illnesses we will initiate molnupiravir 200 mg 4 capsules twice a day.  Will check chest x-ray for current status pneumonia. - DG Chest 2 View - molnupiravir EUA (LAGEVRIO) 200 mg CAPS capsule; Take 4 capsules (800 mg total) by mouth 2 (two) times daily for 5 days.  Dispense: 40 capsule; Refill: 0  3. Stasis dermatitis of both legs Patient has been stasis dermatitis both legs we will treat with her usual triamcinolone cream 0.1% apply twice a day as needed. - triamcinolone cream (KENALOG) 0.1 %; Apply 1 Application topically 2 (two) times daily.  Dispense: 30 g; Refill: 0     Friday, MD

## 2022-11-28 NOTE — Addendum Note (Signed)
Addended by: Fredderick Severance on: 11/28/2022 04:38 PM   Modules accepted: Orders

## 2022-11-30 DIAGNOSIS — M179 Osteoarthritis of knee, unspecified: Secondary | ICD-10-CM | POA: Diagnosis not present

## 2022-12-09 ENCOUNTER — Ambulatory Visit: Payer: Medicare Other | Admitting: Family Medicine

## 2023-01-06 ENCOUNTER — Ambulatory Visit (INDEPENDENT_AMBULATORY_CARE_PROVIDER_SITE_OTHER): Payer: Medicare Other | Admitting: Family Medicine

## 2023-01-06 ENCOUNTER — Encounter: Payer: Self-pay | Admitting: Family Medicine

## 2023-01-06 VITALS — BP 160/102 | HR 59 | Ht 63.0 in | Wt 176.0 lb

## 2023-01-06 DIAGNOSIS — J301 Allergic rhinitis due to pollen: Secondary | ICD-10-CM | POA: Diagnosis not present

## 2023-01-06 DIAGNOSIS — E119 Type 2 diabetes mellitus without complications: Secondary | ICD-10-CM

## 2023-01-06 DIAGNOSIS — E7849 Other hyperlipidemia: Secondary | ICD-10-CM | POA: Diagnosis not present

## 2023-01-06 DIAGNOSIS — J3089 Other allergic rhinitis: Secondary | ICD-10-CM | POA: Diagnosis not present

## 2023-01-06 DIAGNOSIS — J3489 Other specified disorders of nose and nasal sinuses: Secondary | ICD-10-CM | POA: Diagnosis not present

## 2023-01-06 MED ORDER — GLIPIZIDE ER 2.5 MG PO TB24: 2.5000 mg | ORAL_TABLET | Freq: Every day | ORAL | 1 refills | Status: DC

## 2023-01-06 MED ORDER — METFORMIN HCL 500 MG PO TABS: 500.0000 mg | ORAL_TABLET | Freq: Two times a day (BID) | ORAL | 1 refills | Status: DC

## 2023-01-06 MED ORDER — LORATADINE 10 MG PO TABS: 10.0000 mg | ORAL_TABLET | Freq: Every day | ORAL | 11 refills | Status: DC

## 2023-01-06 MED ORDER — MOMETASONE FUROATE 50 MCG/ACT NA SUSP: NASAL | 0 refills | Status: AC

## 2023-01-06 MED ORDER — MONTELUKAST SODIUM 10 MG PO TABS: 10.0000 mg | ORAL_TABLET | Freq: Every day | ORAL | 1 refills | Status: DC

## 2023-01-06 NOTE — Progress Notes (Signed)
Date:  01/06/2023   Name:  [name]   DOB:  01-Apr-1932   MRN:  [id number]   Chief Complaint: Diabetes  Diabetes She presents for her follow-up diabetic visit. She has type 2 diabetes mellitus. Her disease course has been stable. There are no hypoglycemic associated symptoms. Pertinent negatives for diabetes include no blurred vision, no chest pain, no fatigue, no foot paresthesias, no foot ulcerations, no polydipsia, no polyuria, no visual change and no weight loss. There are no hypoglycemic complications. Symptoms are stable. There are no diabetic complications. There are no known risk factors for coronary artery disease.    Lab Results  Component Value Date   NA 136 09/16/2022   K 4.1 09/16/2022   CO2 24 09/16/2022   GLUCOSE 101 (H) 09/16/2022   BUN 18 09/16/2022   CREATININE 0.51 09/16/2022   CALCIUM 8.9 09/16/2022   EGFR 86 01/17/2022   GFRNONAA >60 09/16/2022   Lab Results  Component Value Date   CHOL 170 01/17/2022   HDL 62 01/17/2022   LDLCALC 97 01/17/2022   TRIG 57 01/17/2022   CHOLHDL 2.2 07/23/2019   Lab Results  Component Value Date   TSH 1.800 05/09/2022   Lab Results  Component Value Date   HGBA1C 6.1 (H) 09/16/2022   Lab Results  Component Value Date   WBC 5.4 04/13/2021   HGB 10.6 (L) 04/13/2021   HCT 33.3 (L) 04/13/2021   MCV 84.3 04/13/2021   PLT 215 04/13/2021   Lab Results  Component Value Date   ALT 5 01/17/2022   AST 11 01/17/2022   ALKPHOS 85 01/17/2022   BILITOT 0.3 01/17/2022   No results found for: "25OHVITD2", "25OHVITD3", "VD25OH"   Review of Systems  Constitutional:  Negative for chills, fatigue, fever, unexpected weight change and weight loss.  HENT:  Negative for congestion.   Eyes:  Negative for blurred vision and visual disturbance.  Respiratory:  Negative for cough, chest tightness and wheezing.   Cardiovascular:  Negative for chest pain and palpitations.  Gastrointestinal:  Negative for abdominal pain and blood  in stool.  Endocrine: Negative for polydipsia and polyuria.  Genitourinary:  Negative for difficulty urinating.    Patient Active Problem List   Diagnosis Date Noted   Valvular heart disease 07/23/2021   Chronic pain of right knee 06/17/2021   Chronic pain syndrome 06/17/2021   Near syncope 04/12/2021   Pain due to total right knee replacement ([place]) 03/02/2021   Aortic valve regurgitation 10/23/2020   Bradycardia 09/04/2019   PVC's (premature ventricular contractions) 09/04/2019   Leg edema 09/04/2019   Sinus bradycardia 03/16/2018   PSVT (paroxysmal supraventricular tachycardia) 03/16/2018   NSVT (nonsustained ventricular tachycardia) ([place]) 03/16/2018   Malignant HTN with heart disease, w/o CHF, w/o chronic kidney disease 02/05/2018   Hyperlipidemia 09/24/2015   Diabetes mellitus with no complication ([place]) [id number]   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   Arthritis of knee, degenerative 03/16/2015   Sinus infection 03/16/2015   Diabetes ([place])    Essential hypertension    GERD (gastroesophageal reflux disease)    History of bilateral knee replacement     Allergies  Allergen Reactions   Nsaids    Lisinopril     Cough    Past Surgical History:  Procedure Laterality Date   BACK SURGERY     CATARACT EXTRACTION Bilateral 03/2016   CERVICAL [place] SURGERY  12/28/2010   "bulging disc; went in on the left side of my neck" (08/19/2013)  JOINT REPLACEMENT     TOTAL KNEE ARTHROPLASTY Left 03/28/2011   Dr Noemi Chapel   TOTAL KNEE ARTHROPLASTY Right 08/19/2013   TOTAL KNEE ARTHROPLASTY Right 08/19/2013   Procedure: TOTAL KNEE ARTHROPLASTY;  Surgeon: Lorn Junes, MD;  Location: [facility];  Service: Orthopedics;  Laterality: Right;    Social History   Tobacco Use   Smoking status: Never   Smokeless tobacco: Never   Tobacco comments:    Smoking cessation materials not required  Vaping Use   Vaping Use: Never used  Substance Use Topics   Alcohol use: Not  Currently   Drug use: Never     Medication list has been reviewed and updated.  Current Meds  Medication Sig   cyanocobalamin 100 MCG tablet Take 1 tablet by mouth daily.    docusate sodium (COLACE) 50 MG capsule Take 1 capsule (50 mg total) by mouth 2 (two) times daily.   furosemide (LASIX) 20 MG tablet Take 1 tablet (20 mg total) by mouth daily. Decrease dosing to three times a week   glipiZIDE (GLUCOTROL XL) 2.5 MG 24 hr tablet Take 1 tablet (2.5 mg total) by mouth daily with breakfast.   glucose blood (ONE TOUCH ULTRA TEST) test strip USE 1 STRIP TO CHECK GLUCOSE ONCE DAILY   loratadine (CLARITIN) 10 MG tablet Take 1 tablet (10 mg total) by mouth daily.   metFORMIN (GLUCOPHAGE) 500 MG tablet Take 1 tablet (500 mg total) by mouth 2 (two) times daily with a meal.   metoprolol tartrate (LOPRESSOR) 25 MG tablet Take 1 tablet (25 mg total) by mouth 2 (two) times daily.   mometasone (NASONEX) 50 MCG/ACT nasal spray Use 2 spray(s) in each nostril once daily   montelukast (SINGULAIR) 10 MG tablet Take 1 tablet (10 mg total) by mouth at bedtime.   omeprazole (PRILOSEC) 20 MG capsule Take 1 capsule (20 mg total) by mouth daily.   tiZANidine (ZANAFLEX) 2 MG tablet Take 1 tablet (2 mg total) by mouth at bedtime as needed for muscle spasms.   triamcinolone cream (KENALOG) 0.1 % Apply 1 Application topically 2 (two) times daily.       01/06/2023    1:35 PM 11/28/2022    2:20 PM 08/10/2022   11:24 AM 07/12/2022    3:09 PM  GAD 7 : Generalized Anxiety Score  Nervous, Anxious, on Edge 0 0 0 0  Control/stop worrying 0 0 0 0  Worry too much - different things 0 0 0 0  Trouble relaxing 0 0 0 0  Restless 0 0 0 0  Easily annoyed or irritable 0 0 0 0  Afraid - awful might happen 0 0 0 0  Total GAD 7 Score 0 0 0 0  Anxiety Difficulty Not difficult at all Not difficult at all Not difficult at all Not difficult at all       01/06/2023    1:35 PM 11/28/2022    2:20 PM 08/26/2022   11:05 AM  Depression  screen PHQ 2/9  Decreased Interest 0 0 0  Down, Depressed, Hopeless 0 0 0  PHQ - 2 Score 0 0 0  Altered sleeping 0 0   Tired, decreased energy 0 0   Change in appetite 0 0   Feeling bad or failure about yourself  0 0   Trouble concentrating 0 0   Moving slowly or fidgety/restless 0 0   Suicidal thoughts 0 0   PHQ-9 Score 0 0   Difficult doing work/chores Not difficult at  all Not difficult at all     BP Readings from Last 3 Encounters:  01/06/23 (!) 160/102  11/28/22 120/68  09/16/22 126/74    Physical Exam Vitals and nursing note reviewed. Exam conducted with a chaperone present.  Constitutional:      General: She is not in acute distress.    Appearance: She is not diaphoretic.  HENT:     Head: Normocephalic and atraumatic.     Right Ear: External ear normal.     Left Ear: External ear normal.     Nose: Nose normal.  Eyes:     General:        Right eye: No discharge.        Left eye: No discharge.     Conjunctiva/sclera: Conjunctivae normal.     Pupils: Pupils are equal, round, and reactive to light.  Neck:     Thyroid: No thyromegaly.     Vascular: No JVD.  Cardiovascular:     Rate and Rhythm: Normal rate and regular rhythm.     Heart sounds: Normal heart sounds, S1 normal and S2 normal. No murmur heard.    No systolic murmur is present.     No diastolic murmur is present.     No friction rub. No gallop. No S3 or S4 sounds.  Pulmonary:     Effort: Pulmonary effort is normal.     Breath sounds: Normal breath sounds. No wheezing, rhonchi or rales.  Abdominal:     General: Bowel sounds are normal.     Palpations: Abdomen is soft. There is no mass.     Tenderness: There is no abdominal tenderness. There is no guarding.  Musculoskeletal:        General: Normal range of motion.     Cervical back: Normal range of motion and neck supple.  Lymphadenopathy:     Cervical: No cervical adenopathy.  Skin:    General: Skin is warm and dry.  Neurological:     Mental  Status: She is alert.     Deep Tendon Reflexes: Reflexes are normal and symmetric.     Wt Readings from Last 3 Encounters:  01/06/23 176 lb (79.8 kg)  11/28/22 170 lb (77.1 kg)  09/16/22 170 lb (77.1 kg)    BP (!) 160/102   Pulse (!) 59   Ht 5' 3"$  (1.6 m)   Wt 176 lb (79.8 kg)   SpO2 95%   BMI 31.18 kg/m   Assessment and Plan:  1. Type 2 diabetes mellitus without complication, without long-term current use of insulin (HCC) Chronic.  Controlled.  Stable.  Continue glipizide XL 2.5 mg once a day and metformin 500 mg 1 tablet twice a day.  Will check A1c and microalbuminuria for current level of control. - glipiZIDE (GLUCOTROL XL) 2.5 MG 24 hr tablet; Take 1 tablet (2.5 mg total) by mouth daily with breakfast.  Dispense: 90 tablet; Refill: 1 - metFORMIN (GLUCOPHAGE) 500 MG tablet; Take 1 tablet (500 mg total) by mouth 2 (two) times daily with a meal.  Dispense: 180 tablet; Refill: 1 - HgB A1c - Microalbumin / creatinine urine ratio  2. Acute seasonal allergic rhinitis due to pollen Chronic.  Controlled.  Stable.  Continue Nasonex as well as Singulair 10 mg once a day and loratadine 10 mg once a day.  For rhinorrhea. - mometasone (NASONEX) 50 MCG/ACT nasal spray; Use 2 spray(s) in each nostril once daily  Dispense: 17 g; Refill: 0  3. Rhinorrhea As noted above  as noted above - loratadine (CLARITIN) 10 MG tablet; Take 1 tablet (10 mg total) by mouth daily.  Dispense: 30 tablet; Refill: 11  4. Non-seasonal allergic rhinitis, unspecified trigger As noted above - montelukast (SINGULAIR) 10 MG tablet; Take 1 tablet (10 mg total) by mouth at bedtime.  Dispense: 90 tablet; Refill: 1  5. Familial multiple lipoprotein-type hyperlipidemia Chronic.  Controlled.  Stable.  Continue dietary measures for control of lipids and we will check current LDL control with lipid panel. - Lipid Panel With LDL/HDL Ratio    Otilio Miu, MD

## 2023-01-08 LAB — HEMOGLOBIN A1C
Est. average glucose Bld gHb Est-mCnc: 140 mg/dL
Hgb A1c MFr Bld: 6.5 % — ABNORMAL HIGH (ref 4.8–5.6)

## 2023-01-08 LAB — MICROALBUMIN / CREATININE URINE RATIO
Creatinine, Urine: 166.7 mg/dL
Microalb/Creat Ratio: 62 mg/g creat — ABNORMAL HIGH (ref 0–29)
Microalbumin, Urine: 103.7 ug/mL

## 2023-01-08 LAB — LIPID PANEL WITH LDL/HDL RATIO
Cholesterol, Total: 183 mg/dL (ref 100–199)
HDL: 60 mg/dL (ref >39)
LDL Chol Calc (NIH): 104 mg/dL — ABNORMAL HIGH (ref 0–99)
LDL/HDL Ratio: 1.7 ratio (ref 0.0–3.2)
Triglycerides: 109 mg/dL (ref 0–149)
VLDL Cholesterol Cal: 19 mg/dL (ref 5–40)

## 2023-01-09 NOTE — Progress Notes (Signed)
PC to pt discussed lab results, pt voiced understanding.

## 2023-02-01 DIAGNOSIS — L2089 Other atopic dermatitis: Secondary | ICD-10-CM | POA: Diagnosis not present

## 2023-02-01 DIAGNOSIS — I872 Venous insufficiency (chronic) (peripheral): Secondary | ICD-10-CM | POA: Diagnosis not present

## 2023-02-01 DIAGNOSIS — L858 Other specified epidermal thickening: Secondary | ICD-10-CM | POA: Diagnosis not present

## 2023-02-08 ENCOUNTER — Ambulatory Visit: Payer: Medicare Other | Admitting: Student in an Organized Health Care Education/Training Program

## 2023-02-09 ENCOUNTER — Encounter: Payer: Self-pay | Admitting: Student in an Organized Health Care Education/Training Program

## 2023-02-09 ENCOUNTER — Ambulatory Visit
Admission: RE | Admit: 2023-02-09 | Discharge: 2023-02-09 | Disposition: A | Discharge status: Home / Self Care | Payer: Medicare Other | Source: Ambulatory Visit | Attending: Student in an Organized Health Care Education/Training Program | Admitting: Student in an Organized Health Care Education/Training Program

## 2023-02-09 ENCOUNTER — Ambulatory Visit (HOSPITAL_BASED_OUTPATIENT_CLINIC_OR_DEPARTMENT_OTHER): Payer: Medicare Other | Admitting: Student in an Organized Health Care Education/Training Program

## 2023-02-09 VITALS — BP 182/67 | HR 58 | Temp 97.4°F | Resp 16 | Ht 63.0 in | Wt 173.0 lb

## 2023-02-09 DIAGNOSIS — Z96653 Presence of artificial knee joint, bilateral: Secondary | ICD-10-CM

## 2023-02-09 DIAGNOSIS — M25511 Pain in right shoulder: Secondary | ICD-10-CM

## 2023-02-09 DIAGNOSIS — M25562 Pain in left knee: Secondary | ICD-10-CM | POA: Insufficient documentation

## 2023-02-09 DIAGNOSIS — T8484XS Pain due to internal orthopedic prosthetic devices, implants and grafts, sequela: Secondary | ICD-10-CM | POA: Insufficient documentation

## 2023-02-09 DIAGNOSIS — G8929 Other chronic pain: Secondary | ICD-10-CM | POA: Insufficient documentation

## 2023-02-09 DIAGNOSIS — M25512 Pain in left shoulder: Secondary | ICD-10-CM

## 2023-02-09 DIAGNOSIS — M19012 Primary osteoarthritis, left shoulder: Secondary | ICD-10-CM | POA: Insufficient documentation

## 2023-02-09 DIAGNOSIS — M19011 Primary osteoarthritis, right shoulder: Secondary | ICD-10-CM | POA: Insufficient documentation

## 2023-02-09 DIAGNOSIS — Z96652 Presence of left artificial knee joint: Secondary | ICD-10-CM | POA: Insufficient documentation

## 2023-02-09 NOTE — Patient Instructions (Addendum)
Voltaren gel application to left knee  (diclofenac) Xray for bilateral shoulders Referral for home health PT  ____________________________________________________________________________________________  General Risks and Possible Complications  Patient Responsibilities: It is important that you read this as it is part of your informed consent. It is our duty to inform you of the risks and possible complications associated with treatments offered to you. It is your responsibility as a patient to read this and to ask questions about anything that is not clear or that you believe was not covered in this document.  Patient's Rights: You have the right to refuse treatment. You also have the right to change your mind, even after initially having agreed to have the treatment done. However, under this last option, if you wait until the last second to change your mind, you may be charged for the materials used up to that point.  Introduction: Medicine is not an Chief Strategy Officer. Everything in Medicine, including the lack of treatment(s), carries the potential for danger, harm, or loss (which is by definition: Risk). In Medicine, a complication is a secondary problem, condition, or disease that can aggravate an already existing one. All treatments carry the risk of possible complications. The fact that a side effects or complications occurs, does not imply that the treatment was conducted incorrectly. It must be clearly understood that these can happen even when everything is done following the highest safety standards.  No treatment: You can choose not to proceed with the proposed treatment alternative. The "PRO(s)" would include: avoiding the risk of complications associated with the therapy. The "CON(s)" would include: not getting any of the treatment benefits. These benefits fall under one of three categories: diagnostic; therapeutic; and/or palliative. Diagnostic benefits include: getting information which  can ultimately lead to improvement of the disease or symptom(s). Therapeutic benefits are those associated with the successful treatment of the disease. Finally, palliative benefits are those related to the decrease of the primary symptoms, without necessarily curing the condition (example: decreasing the pain from a flare-up of a chronic condition, such as incurable terminal cancer).  General Risks and Complications: These are associated to most interventional treatments. They can occur alone, or in combination. They fall under one of the following six (6) categories: no benefit or worsening of symptoms; bleeding; infection; nerve damage; allergic reactions; and/or death. No benefits or worsening of symptoms: In Medicine there are no guarantees, only probabilities. No healthcare provider can ever guarantee that a medical treatment will work, they can only state the probability that it may. Furthermore, there is always the possibility that the condition may worsen, either directly, or indirectly, as a consequence of the treatment. Bleeding: This is more common if the patient is taking a blood thinner, either prescription or over the counter (example: Goody Powders, Fish oil, Aspirin, Garlic, etc.), or if suffering a condition associated with impaired coagulation (example: Hemophilia, cirrhosis of the liver, low platelet counts, etc.). However, even if you do not have one on these, it can still happen. If you have any of these conditions, or take one of these drugs, make sure to notify your treating physician. Infection: This is more common in patients with a compromised immune system, either due to disease (example: diabetes, cancer, human immunodeficiency virus [HIV], etc.), or due to medications or treatments (example: therapies used to treat cancer and rheumatological diseases). However, even if you do not have one on these, it can still happen. If you have any of these conditions, or take one of these  drugs,  make sure to notify your treating physician. Nerve Damage: This is more common when the treatment is an invasive one, but it can also happen with the use of medications, such as those used in the treatment of cancer. The damage can occur to small secondary nerves, or to large primary ones, such as those in the spinal cord and brain. This damage may be temporary or permanent and it may lead to impairments that can range from temporary numbness to permanent paralysis and/or brain death. Allergic Reactions: Any time a substance or material comes in contact with our body, there is the possibility of an allergic reaction. These can range from a mild skin rash (contact dermatitis) to a severe systemic reaction (anaphylactic reaction), which can result in death. Death: In general, any medical intervention can result in death, most of the time due to an unforeseen complication. ____________________________________________________________________________________________    ______________________________________________________________________  Preparing for your procedure  Appointments: If you think you may not be able to keep your appointment, call 24-48 hours in advance to cancel. We need time to make it available to others.  During your procedure appointment there will be: No Prescription Refills. No disability issues to discussed. No medication changes or discussions.  Instructions: Food intake: Avoid eating anything solid for at least 8 hours prior to your procedure. Clear liquid intake: You may take clear liquids such as water up to 2 hours prior to your procedure. (No carbonated drinks. No soda.) Transportation: Unless otherwise stated by your physician, bring a driver. Morning Medicines: Except for blood thinners, take all of your other morning medications with a sip of water. Make sure to take your heart and blood pressure medicines. If your blood pressure's lower number is above 100,  the case will be rescheduled. Blood thinners: Make sure to stop your blood thinners as instructed.  If you take a blood thinner, but were not instructed to stop it, call our office [phone] and ask to talk to a nurse. Not stopping a blood thinner prior to certain procedures could lead to serious complications. Diabetics on insulin: Notify the staff so that you can be scheduled 1st case in the morning. If your diabetes requires high dose insulin, take only  of your normal insulin dose the morning of the procedure and notify the staff that you have done so. Preventing infections: Shower with an antibacterial soap the morning of your procedure.  Build-up your immune system: Take 1000 mg of Vitamin C with every meal (3 times a day) the day prior to your procedure. Antibiotics: Inform the nursing staff if you are taking any antibiotics or if you have any conditions that may require antibiotics prior to procedures. (Example: recent joint implants)   Pregnancy: If you are pregnant make sure to notify the nursing staff. Not doing so may result in injury to the fetus, including death.  Sickness: If you have a cold, fever, or any active infections, call and cancel or reschedule your procedure. Receiving steroids while having an infection may result in complications. Arrival: You must be in the facility at least 30 minutes prior to your scheduled procedure. Tardiness: Your scheduled time is also the cutoff time. If you do not arrive at least 15 minutes prior to your procedure, you will be rescheduled.  Children: Do not bring any children with you. Make arrangements to keep them home. Dress appropriately: There is always a possibility that your clothing may get soiled. Avoid long dresses. Valuables: Do not bring any jewelry or  valuables.  Reasons to call and reschedule or cancel your procedure: (Following these recommendations will minimize the risk of a serious complication.) Surgeries: Avoid having  procedures within 2 weeks of any surgery. (Avoid for 2 weeks before or after any surgery). Flu Shots: Avoid having procedures within 2 weeks of a flu shots or . (Avoid for 2 weeks before or after immunizations). Barium: Avoid having a procedure within 7-10 days after having had a radiological study involving the use of radiological contrast. (Myelograms, Barium swallow or enema study). Heart attacks: Avoid any elective procedures or surgeries for the initial 6 months after a "Myocardial Infarction" (Heart Attack). Blood thinners: It is imperative that you stop these medications before procedures. Let us know if you if you take any blood thinner.  Infection: Avoid procedures during or within two weeks of an infection (including chest colds or gastrointestinal problems). Symptoms associated with infections include: Localized redness, fever, chills, night sweats or profuse sweating, burning sensation when voiding, cough, congestion, stuffiness, runny nose, sore throat, diarrhea, nausea, vomiting, cold or Flu symptoms, recent or current infections. It is specially important if the infection is over the area that we intend to treat. Heart and lung problems: Symptoms that may suggest an active cardiopulmonary problem include: cough, chest pain, breathing difficulties or shortness of breath, dizziness, ankle swelling, uncontrolled high or unusually low blood pressure, and/or palpitations. If you are experiencing any of these symptoms, cancel your procedure and contact your primary care physician for an evaluation.  Remember:  Regular Business hours are:  Monday to Thursday 8:00 AM to 4:00 PM  Provider's Schedule: Milinda Pointer, MD:  Procedure days: Tuesday and Thursday 7:30 AM to 4:00 PM  Gillis Santa, MD:  Procedure days: Monday and Wednesday 7:30 AM to 4:00 PM  ______________________________________________________________________

## 2023-02-09 NOTE — Progress Notes (Signed)
PROVIDER NOTE: Information contained herein reflects review and annotations entered in association with encounter. Interpretation of such information and data should be left to medically-trained personnel. Information provided to patient can be located elsewhere in the medical record under "Patient Instructions". Document created using STT-dictation technology, any transcriptional errors that may result from process are unintentional.    Patient: [name]  Service Category: E/M  Provider: Gillis Santa, MD  DOB: 08-12-1932  DOS: 02/09/2023  Specialty: Interventional Pain Management  MRN: [id number]  Setting: Ambulatory outpatient  PCP: Juline Patch, MD  Type: Established Patient    Referring Provider: Juline Patch, MD  Location: Office  Delivery: Face-to-face     HPI  [name], a 89 y.o. year old female, is here today because of her Chronic pain of left knee [M25.562, G89.29]. Ms. Medal's primary complain today is Knee Pain (Left ) and Shoulder Pain (Bilateral, left is worse ) Last encounter: My last encounter with her was on 11/29/2021.  Pain Assessment: Severity of Chronic pain is reported as a 5 /10. Location: Knee (left shoulder, mostly sore) Left/shoulder pain down the left arm.  knee pain up into the thigh. Onset: More than a month ago. Quality: Sore, Tingling, Constant, Discomfort. Timing: Constant. Modifying factor(s): nothing currently. Vitals:  height is 5\' 3"  (1.6 m) and weight is 173 lb (78.5 kg). Her temporal temperature is 97.4 F (36.3 C) (abnormal). Her blood pressure is 175/72 (abnormal) and her pulse is 58 (abnormal). Her respiration is 16 and oxygen saturation is 100%.   Reason for encounter:   Persistent left knee pain.  History of left knee replacement.  Has tried many intra-articular knee steroid injections as well as gel injections.  Has done genicular nerve block with me in the past as well as left genicular nerve RFA which was done 10/06/2021. Discussed  repeat GN RFA, patient wants to hold off  She is also complaining of left >right shoulder pain and difficulty with shoulder abduction. Will order shoulder xrays and discussed shoulder joint injection Patient will also benefit from home health PT, referral placed  ROS  Constitutional: Denies any fever or chills Gastrointestinal: No reported hemesis, hematochezia, vomiting, or acute GI distress Musculoskeletal:  Left greater than right knee pain, bilateral shoulder pain (L>R) Neurological: No reported episodes of acute onset apraxia, aphasia, dysarthria, agnosia, amnesia, paralysis, loss of coordination, or loss of consciousness  Medication Review  cyanocobalamin, docusate sodium, fluocinonide, furosemide, glipiZIDE, glucose blood, latanoprost, loratadine, metFORMIN, metoprolol tartrate, mometasone, montelukast, omeprazole, and triamcinolone cream  History Review  Allergy: Ms. Valentini is allergic to nsaids and lisinopril. Drug: Ms. Maret  reports no history of drug use. Alcohol:  reports that she does not currently use alcohol. Tobacco:  reports that she has never smoked. She has never used smokeless tobacco. Social: Ms. Ellenbecker  reports that she has never smoked. She has never used smokeless tobacco. She reports that she does not currently use alcohol. She reports that she does not use drugs. Medical:  has a past medical history of GERD (gastroesophageal reflux disease), Hypertension, Right knee DJD, and Type II diabetes mellitus ([place]). Surgical: Ms. Santell  has a past surgical history that includes Total knee arthroplasty (Left, 03/28/2011); Cervical disc surgery (12/28/2010); Total knee arthroplasty (Right, 08/19/2013); Joint replacement; Back surgery; Total knee arthroplasty (Right, 08/19/2013); and Cataract extraction (Bilateral, 03/2016). Family: family history includes Cerebral aneurysm in her father; Diabetes in her mother.  Laboratory Chemistry Profile   Renal Lab Results  Component  Value Date   BUN 18 09/16/2022   CREATININE 0.51 09/16/2022   BCR 27 01/17/2022   GFRAA 91 03/18/2020   GFRNONAA >60 09/16/2022    Hepatic Lab Results  Component Value Date   AST 11 01/17/2022   ALT 5 01/17/2022   ALBUMIN 3.3 (L) 09/16/2022   ALKPHOS 85 01/17/2022    Electrolytes Lab Results  Component Value Date   NA 136 09/16/2022   K 4.1 09/16/2022   CL 107 09/16/2022   CALCIUM 8.9 09/16/2022   MG 1.7 05/09/2022   PHOS 3.7 09/16/2022    Bone No results found for: "VD25OH", "VD125OH2TOT", "[id number]", "[id number]", "25OHVITD1", "25OHVITD2", "25OHVITD3", "TESTOFREE", "TESTOSTERONE"  Inflammation (CRP: Acute Phase) (ESR: Chronic Phase) No results found for: "CRP", "ESRSEDRATE", "LATICACIDVEN"       Note: Above Lab results reviewed.  Recent Imaging Review  DG Chest 2 View CLINICAL DATA:  Fever, COVID.  EXAM: CHEST - 2 VIEW  COMPARISON:  Chest x-ray 12/06/2021  FINDINGS: Heart is mildly enlarged, unchanged. Lungs are clear. There is no pleural effusion or pneumothorax. Degenerative changes affect both shoulders. Cervical spinal fusion plate is present.  IMPRESSION: No active cardiopulmonary disease.  Electronically Signed   By: Ronney Asters M.D.   On: 11/28/2022 15:21 Note: Reviewed        Physical Exam  General appearance: Well nourished, well developed, and well hydrated. In no apparent acute distress Mental status: Alert, oriented x 3 (person, place, & time)       Respiratory: No evidence of acute respiratory distress Eyes: PERLA Vitals: BP (!) 175/72 (BP Location: Left Arm, Patient Position: Sitting, Cuff Size: Normal)   Pulse (!) 58   Temp (!) 97.4 F (36.3 C) (Temporal)   Resp 16   Ht 5\' 3"  (1.6 m)   Wt 173 lb (78.5 kg)   SpO2 100%   BMI 30.65 kg/m  BMI: Estimated body mass index is 30.65 kg/m as calculated from the following:   Height as of this encounter: 5\' 3"  (1.6 m).   Weight as of this encounter: 173 lb (78.5 kg). Ideal: Ideal body  weight: 52.4 kg (115 lb 8.3 oz) Adjusted ideal body weight: 62.8 kg (138 lb 8.2 oz)  Bilateral shoulder pain, L>R, worse with shoulder abduction    Lumbar Spine Area Exam  Skin & Axial Inspection: No masses, redness, or swelling Alignment: Symmetrical Functional ROM: Pain restricted ROM       Stability: No instability detected Muscle Tone/Strength: Functionally intact. No obvious neuro-muscular anomalies detected. Sensory (Neurological): Musculoskeletal pain pattern   Gait & Posture Assessment  Ambulation: Patient came in today in a wheel chair Gait: Limited. Using assistive device to ambulate Posture: Difficulty standing up straight, due to pain  Lower Extremity Exam      Side: Right lower extremity   Side: Left lower extremity  Stability: No instability observed           Stability: No instability observed          Skin & Extremity Inspection: Evidence of prior arthroplastic surgery   Skin & Extremity Inspection: Evidence of prior arthroplastic surgery  Functional ROM: Unrestricted ROM                   Functional ROM: Pain restricted ROM left knee                  Muscle Tone/Strength: Functionally intact. No obvious neuro-muscular anomalies detected.   Muscle Tone/Strength: Functionally intact. No obvious  neuro-muscular anomalies detected.  Sensory (Neurological): Arthropathic arthralgia            Sensory (Neurological): Arthropathic arthralgia        DTR: Patellar: 0: absent Achilles: deferred today Plantar: deferred today   DTR: Patellar: 0: absent Achilles: deferred today Plantar: deferred today  Palpation: No palpable anomalies   Palpation: No palpable anomalies    Assessment   Status Diagnosis  Persistent Persistent Controlled 1. Chronic pain of left knee   2. Pain due to total left knee replacement, sequela   3. History of bilateral knee replacement   4. Chronic pain of both shoulders   5. Primary osteoarthritis of both shoulders        Plan of Care    1. Chronic pain of left knee - Ambulatory referral to Physical Therapy  2. Pain due to total left knee replacement, sequela - Ambulatory referral to Physical Therapy  3. History of bilateral knee replacement - Ambulatory referral to Physical Therapy  4. Chronic pain of both shoulders - Ambulatory referral to Physical Therapy - SHOULDER INJECTION; Future - DG Shoulder Left; Future - DG Shoulder Right; Future  5. Primary osteoarthritis of both shoulders - Ambulatory referral to Physical Therapy - SHOULDER INJECTION; Future - DG Shoulder Left; Future - DG Shoulder Right; Future     Follow-up plan:   Return in about 25 days (around 03/06/2023) for B/L Shoulder injection , in clinic NS.     Left knee genicular NB 06/30/21, 08/11/21; RFA 10/06/21      Recent Visits No visits were found meeting these conditions. Showing recent visits within past 90 days and meeting all other requirements Today's Visits Date Type Provider Dept  02/09/23 Office Visit Gillis Santa, MD [facility]  Showing today's visits and meeting all other requirements Future Appointments No visits were found meeting these conditions. Showing future appointments within next 90 days and meeting all other requirements  I discussed the assessment and treatment plan with the patient. The patient was provided an opportunity to ask questions and all were answered. The patient agreed with the plan and demonstrated an understanding of the instructions.  Patient advised to call back or seek an in-person evaluation if the symptoms or condition worsens.  Duration of encounter13minutes.  Note by: Gillis Santa, MD Date: 02/09/2023; Time: 1:26 PM

## 2023-02-09 NOTE — Progress Notes (Signed)
Safety precautions to be maintained throughout the outpatient stay will include: orient to surroundings, keep bed in low position, maintain call bell within reach at all times, provide assistance with transfer out of bed and ambulation.  

## 2023-02-22 DIAGNOSIS — L858 Other specified epidermal thickening: Secondary | ICD-10-CM | POA: Diagnosis not present

## 2023-03-06 ENCOUNTER — Encounter: Payer: Self-pay | Admitting: Student in an Organized Health Care Education/Training Program

## 2023-03-06 ENCOUNTER — Ambulatory Visit
Admission: RE | Admit: 2023-03-06 | Discharge: 2023-03-06 | Disposition: A | Discharge status: Home / Self Care | Payer: Medicare Other | Source: Ambulatory Visit | Attending: Student in an Organized Health Care Education/Training Program | Admitting: Student in an Organized Health Care Education/Training Program

## 2023-03-06 ENCOUNTER — Ambulatory Visit
Payer: Medicare Other | Attending: Student in an Organized Health Care Education/Training Program | Admitting: Student in an Organized Health Care Education/Training Program

## 2023-03-06 VITALS — BP 200/95 | HR 57 | Temp 97.9°F | Resp 21 | Ht 63.0 in | Wt 173.0 lb

## 2023-03-06 DIAGNOSIS — M19012 Primary osteoarthritis, left shoulder: Secondary | ICD-10-CM | POA: Insufficient documentation

## 2023-03-06 DIAGNOSIS — G8929 Other chronic pain: Secondary | ICD-10-CM | POA: Diagnosis not present

## 2023-03-06 DIAGNOSIS — M19011 Primary osteoarthritis, right shoulder: Secondary | ICD-10-CM | POA: Insufficient documentation

## 2023-03-06 DIAGNOSIS — G894 Chronic pain syndrome: Secondary | ICD-10-CM

## 2023-03-06 DIAGNOSIS — M25511 Pain in right shoulder: Secondary | ICD-10-CM | POA: Diagnosis not present

## 2023-03-06 DIAGNOSIS — M25512 Pain in left shoulder: Secondary | ICD-10-CM | POA: Diagnosis not present

## 2023-03-06 MED ORDER — IOHEXOL 180 MG/ML  SOLN
10.0000 mL | Freq: Once | INTRAMUSCULAR | Status: AC
  Administered 2023-03-06: 10 mL via INTRA_ARTICULAR
  Filled 2023-03-06: qty 20

## 2023-03-06 MED ORDER — ROPIVACAINE HCL 2 MG/ML IJ SOLN
9.0000 mL | Freq: Once | INTRAMUSCULAR | Status: AC
  Administered 2023-03-06: 9 mL via PERINEURAL
  Filled 2023-03-06: qty 20

## 2023-03-06 MED ORDER — LIDOCAINE HCL 2 % IJ SOLN
20.0000 mL | Freq: Once | INTRAMUSCULAR | Status: AC
  Administered 2023-03-06: 400 mg
  Filled 2023-03-06: qty 20

## 2023-03-06 MED ORDER — METHYLPREDNISOLONE ACETATE 80 MG/ML IJ SUSP
80.0000 mg | Freq: Once | INTRAMUSCULAR | Status: AC
  Administered 2023-03-06: 80 mg via INTRA_ARTICULAR
  Filled 2023-03-06: qty 1

## 2023-03-06 NOTE — Progress Notes (Signed)
Safety precautions to be maintained throughout the outpatient stay will include: orient to surroundings, keep bed in low position, maintain call bell within reach at all times, provide assistance with transfer out of bed and ambulation.  

## 2023-03-06 NOTE — Progress Notes (Signed)
PROVIDER NOTE: Interpretation of information contained herein should be left to medically-trained personnel. Specific patient instructions are provided elsewhere under "Patient Instructions" section of medical record. This document was created in part using STT-dictation technology, any transcriptional errors that may result from this process are unintentional.  Patient: [name] Type: Established DOB: 1931/12/03 MRN: [id number] PCP: Duanne Limerick, MD  Service: Procedure DOS: 03/06/2023 Setting: Ambulatory Location: Ambulatory outpatient facility Delivery: Face-to-face Provider: Edward Jolly, MD Specialty: Interventional Pain Management Specialty designation: 09 Location: Outpatient facility Ref. Prov.: Duanne Limerick, MD       Interventional Therapy   Procedure: Glenohumeral Joint (shoulder) Injection #1  Laterality: Bilateral (-50)  Level: Shoulder   Imaging: Fluoroscopy-guided         Anesthesia: Local anesthesia (1-2% Lidocaine) DOS: 03/06/2023  Performed by: Edward Jolly, MD  Purpose: Diagnostic/Therapeutic Indications: Shoulder pain severe enough to impact quality of life or function. Rationale (medical necessity): procedure needed and proper for the diagnosis and/or treatment of [name]'s medical symptoms and needs. 1. Chronic pain of both shoulders   2. Primary osteoarthritis of both shoulders   3. Chronic pain syndrome    NAS-11 Pain score:   Pre-procedure: 5 /10   Post-procedure: 1 /10      Target: Glenohumeral Joint (shoulder) Location: Intra-articular  Region: Entire Shoulder Area Approach: Anterolateral approach. Type of procedure: Percutaneous joint injection   Position  Prep  Materials:  Position: Supine Prep solution: DuraPrep (Iodine Povacrylex [0.7% available iodine] and Isopropyl Alcohol, 74% w/w) Prep Area: Entire shoulder Area Materials:  Tray: Block Needle(s):  Type: Spinal  Gauge (G): 22  Length: 3.5-in  Qty: 2  Pre-op H&P  Assessment:  [name] is a 89 y.o. (year old), female patient, seen today for interventional treatment. She  has a past surgical history that includes Total knee arthroplasty (Left, 03/28/2011); Cervical disc surgery (12/28/2010); Total knee arthroplasty (Right, 08/19/2013); Joint replacement; Back surgery; Total knee arthroplasty (Right, 08/19/2013); and Cataract extraction (Bilateral, 03/2016). [name] has a current medication list which includes the following prescription(s): cyanocobalamin, docusate sodium, fluocinonide, furosemide, glipizide, glucose blood, latanoprost, loratadine, metformin, metoprolol tartrate, mometasone, montelukast, omeprazole, and triamcinolone cream. Her primarily concern today is the Shoulder Pain (bilateral)  Initial Vital Signs:  Pulse/HCG Rate: (!) 57ECG Heart Rate: 63 Temp: 97.9 F (36.6 C) Resp: (!) 28 BP: (!) 159/71 SpO2: 98 %  BMI: Estimated body mass index is 30.65 kg/m as calculated from the following:   Height as of this encounter: 5\' 3"  (1.6 m).   Weight as of this encounter: 173 lb (78.5 kg).  Risk Assessment: Allergies: Reviewed. She is allergic to nsaids and lisinopril.  Allergy Precautions: None required Coagulopathies: Reviewed. None identified.  Blood-thinner therapy: None at this time Active Infection(s): Reviewed. None identified. [name] is afebrile  Site Confirmation: [name] was asked to confirm the procedure and laterality before marking the site Procedure checklist: Completed Consent: Before the procedure and under the influence of no sedative(s), amnesic(s), or anxiolytics, the patient was informed of the treatment options, risks and possible complications. To fulfill our ethical and legal obligations, as recommended by the American Medical Association's Code of Ethics, I have informed the patient of my clinical impression; the nature and purpose of the treatment or procedure; the risks, benefits, and possible complications of  the intervention; the alternatives, including doing nothing; the risk(s) and benefit(s) of the alternative treatment(s) or procedure(s); and the risk(s) and benefit(s) of doing nothing. The patient was provided information  about the general risks and possible complications associated with the procedure. These may include, but are not limited to: failure to achieve desired goals, infection, bleeding, organ or nerve damage, allergic reactions, paralysis, and death. In addition, the patient was informed of those risks and complications associated to the procedure, such as failure to decrease pain; infection; bleeding; organ or nerve damage with subsequent damage to sensory, motor, and/or autonomic systems, resulting in permanent pain, numbness, and/or weakness of one or several areas of the body; allergic reactions; (i.e.: anaphylactic reaction); and/or death. Furthermore, the patient was informed of those risks and complications associated with the medications. These include, but are not limited to: allergic reactions (i.e.: anaphylactic or anaphylactoid reaction(s)); adrenal axis suppression; blood sugar elevation that in diabetics may result in ketoacidosis or comma; water retention that in patients with history of congestive heart failure may result in shortness of breath, pulmonary edema, and decompensation with resultant heart failure; weight gain; swelling or edema; medication-induced neural toxicity; particulate matter embolism and blood vessel occlusion with resultant organ, and/or nervous system infarction; and/or aseptic necrosis of one or more joints. Finally, the patient was informed that Medicine is not an exact science; therefore, there is also the possibility of unforeseen or unpredictable risks and/or possible complications that may result in a catastrophic outcome. The patient indicated having understood very clearly. We have given the patient no guarantees and we have made no promises. Enough  time was given to the patient to ask questions, all of which were answered to the patient's satisfaction. [name] has indicated that she wanted to continue with the procedure. Attestation: I, the ordering provider, attest that I have discussed with the patient the benefits, risks, side-effects, alternatives, likelihood of achieving goals, and potential problems during recovery for the procedure that I have provided informed consent. Date  Time: 03/06/2023  9:54 AM   Imaging Guidance (Non-Spinal):          Type of Imaging Technique: Fluoroscopy Guidance (Non-Spinal) Indication(s): Assistance in needle guidance and placement for procedures requiring needle placement in or near specific anatomical locations not easily accessible without such assistance. Exposure Time: Please see nurses notes. Contrast: None used. Fluoroscopic Guidance: I was personally present during the use of fluoroscopy. "Tunnel Vision Technique" used to obtain the best possible view of the target area. Parallax error corrected before commencing the procedure. "Direction-depth-direction" technique used to introduce the needle under continuous pulsed fluoroscopy. Once target was reached, antero-posterior, oblique, and lateral fluoroscopic projection used confirm needle placement in all planes. Images permanently stored in EMR. Interpretation: No contrast injected. I personally interpreted the imaging intraoperatively. Adequate needle placement confirmed in multiple planes. Permanent images saved into the patient's record.  Pre-Procedure Preparation:  Monitoring: As per clinic protocol. Respiration, ETCO2, SpO2, BP, heart rate and rhythm monitor placed and checked for adequate function Safety Precautions: Patient was assessed for positional comfort and pressure points before starting the procedure. Time-out: I initiated and conducted the "Time-out" before starting the procedure, as per protocol. The patient was asked to participate  by confirming the accuracy of the "Time Out" information. Verification of the correct person, site, and procedure were performed and confirmed by me, the nursing staff, and the patient. "Time-out" conducted as per Joint Commission's Universal Protocol (UP.01.01.01). Time: 1039 Start Time: 1039 hrs.  Description  Narrative of Procedure:          Rationale (medical necessity): procedure needed and proper for the diagnosis and/or treatment of the patient's medical symptoms and needs.  Procedural Technique Safety Precautions: Aspiration looking for blood return was conducted prior to all injections. At no point did we inject any substances, as a needle was being advanced. No attempts were made at seeking any paresthesias. Safe injection practices and needle disposal techniques used. Medications properly checked for expiration dates. SDV (single dose vial) medications used. Description of the Procedure: Protocol guidelines were followed. The patient was placed in position over the procedure table. The target area was identified and the area prepped in the usual manner. Skin & deeper tissues infiltrated with local anesthetic. Appropriate amount of time allowed to pass for local anesthetics to take effect. The procedure needles were then advanced to the target area. Proper needle placement secured. Negative aspiration confirmed. Solution injected in intermittent fashion, asking for systemic symptoms every 0.5cc of injectate. The needles were then removed and the area cleansed, making sure to leave some of the prepping solution back to take advantage of its long term bactericidal properties.  10 cc solution made of 9 cc of 0.2% ropivacaine, 1 cc of methylprednisolone, 80 mg/cc. 5 cc injected into each joint after contrast spread              Vitals:   03/06/23 1033 03/06/23 1040 03/06/23 1043 03/06/23 1050  BP: (!) 177/88 (!) 181/76 (!) 188/102 (!) 200/95  Pulse:      Resp: (!) 23 (!) 23 (!) 21    Temp:      SpO2: 95% 97% 97%   Weight:      Height:         Start Time: 1039 hrs. End Time: 1046 hrs.  Antibiotic Prophylaxis:   Anti-infectives (From admission, onward)    None      Indication(s): None identified  Post-operative Assessment:  Post-procedure Vital Signs:  Pulse/HCG Rate: (!) 57(!) 58 Temp: 97.9 F (36.6 C) Resp: (!) 21 BP: (!) 200/95 SpO2: 97 %  EBL: None  Complications: No immediate post-treatment complications observed by team, or reported by patient.  Note: The patient tolerated the entire procedure well. A repeat set of vitals were taken after the procedure and the patient was kept under observation following institutional policy, for this type of procedure. Post-procedural neurological assessment was performed, showing return to baseline, prior to discharge. The patient was provided with post-procedure discharge instructions, including a section on how to identify potential problems. Should any problems arise concerning this procedure, the patient was given instructions to immediately contact us, at any time, without hesitation. In any case, we plan to contact the patient by telephone for a follow-up status report regarding this interventional procedure.  Comments:  No additional relevant information.  Plan of Care (POC)  Orders:  Orders Placed This Encounter  Procedures   DG PAIN CLINIC C-ARM 1-60 MIN NO REPORT    Intraoperative interpretation by procedural physician at [facility].    Standing Status:   Standing    Number of Occurrences:   1    Order Specific Question:   Reason for exam:    Answer:   Assistance in needle guidance and placement for procedures requiring needle placement in or near specific anatomical locations not easily accessible without such assistance.     Medications ordered for procedure: Meds ordered this encounter  Medications   iohexol (OMNIPAQUE) 180 MG/ML injection 10 mL    Must be Myelogram-compatible.  If not available, you may substitute with a water-soluble, non-ionic, hypoallergenic, myelogram-compatible radiological contrast medium.   lidocaine (XYLOCAINE) 2 % (with pres)  injection 400 mg   methylPREDNISolone acetate (DEPO-MEDROL) injection 80 mg   ropivacaine (PF) 2 mg/mL (0.2%) (NAROPIN) injection 9 mL   Medications administered: We administered iohexol, lidocaine, methylPREDNISolone acetate, and ropivacaine (PF) 2 mg/mL (0.2%).  See the medical record for exact dosing, route, and time of administration.  Follow-up plan:   Return in about 4 weeks (around 04/03/2023), or PPE F2F.       Left knee genicular NB 06/30/21, 08/11/21; RFA 10/06/21, B/L shoulder joint injection anterior 03/06/23        Recent Visits Date Type Provider Dept  02/09/23 Office Visit Edward Jolly, MD [facility]  Showing recent visits within past 90 days and meeting all other requirements Today's Visits Date Type Provider Dept  03/06/23 Procedure visit Edward Jolly, MD [facility]  Showing today's visits and meeting all other requirements Future Appointments Date Type Provider Dept  04/03/23 Appointment Edward Jolly, MD [facility]  Showing future appointments within next 90 days and meeting all other requirements  Disposition: Discharge home  Discharge (Date  Time): 03/06/2023;   hrs.   Primary Care Physician: Duanne Limerick, MD Location: [facility] Note by: Edward Jolly, MD (TTS technology used. I apologize for any typographical errors that were not detected and corrected.) Date: 03/06/2023; Time: 11:06 AM  Disclaimer:  Medicine is not an Visual merchandiser. The only guarantee in medicine is that nothing is guaranteed. It is important to note that the decision to proceed with this intervention was based on the information collected from the patient. The Data and conclusions were drawn from the patient's questionnaire, the interview, and the  physical examination. Because the information was provided in large part by the patient, it cannot be guaranteed that it has not been purposely or unconsciously manipulated. Every effort has been made to obtain as much relevant data as possible for this evaluation. It is important to note that the conclusions that lead to this procedure are derived in large part from the available data. Always take into account that the treatment will also be dependent on availability of resources and existing treatment guidelines, considered by other Pain Management Practitioners as being common knowledge and practice, at the time of the intervention. For Medico-Legal purposes, it is also important to point out that variation in procedural techniques and pharmacological choices are the acceptable norm. The indications, contraindications, technique, and results of the above procedure should only be interpreted and judged by a Board-Certified Interventional Pain Specialist with extensive familiarity and expertise in the same exact procedure and technique.

## 2023-03-06 NOTE — Patient Instructions (Signed)

## 2023-03-07 ENCOUNTER — Telehealth: Payer: Self-pay

## 2023-03-07 NOTE — Telephone Encounter (Signed)
Post procedure follow up.  Patient states she is doing ok.  

## 2023-03-30 ENCOUNTER — Telehealth: Payer: Self-pay

## 2023-03-30 NOTE — Telephone Encounter (Signed)
Attempted to call for ppe.  LM to call office.

## 2023-04-03 ENCOUNTER — Encounter: Payer: Self-pay | Admitting: Student in an Organized Health Care Education/Training Program

## 2023-04-03 ENCOUNTER — Ambulatory Visit
Payer: Medicare Other | Attending: Student in an Organized Health Care Education/Training Program | Admitting: Student in an Organized Health Care Education/Training Program

## 2023-04-03 DIAGNOSIS — M25512 Pain in left shoulder: Secondary | ICD-10-CM | POA: Diagnosis not present

## 2023-04-03 DIAGNOSIS — M19012 Primary osteoarthritis, left shoulder: Secondary | ICD-10-CM | POA: Diagnosis not present

## 2023-04-03 DIAGNOSIS — Z96652 Presence of left artificial knee joint: Secondary | ICD-10-CM | POA: Diagnosis not present

## 2023-04-03 DIAGNOSIS — M25562 Pain in left knee: Secondary | ICD-10-CM | POA: Diagnosis not present

## 2023-04-03 DIAGNOSIS — M19011 Primary osteoarthritis, right shoulder: Secondary | ICD-10-CM | POA: Diagnosis not present

## 2023-04-03 DIAGNOSIS — M25511 Pain in right shoulder: Secondary | ICD-10-CM | POA: Diagnosis not present

## 2023-04-03 DIAGNOSIS — G894 Chronic pain syndrome: Secondary | ICD-10-CM | POA: Diagnosis not present

## 2023-04-03 DIAGNOSIS — T8484XS Pain due to internal orthopedic prosthetic devices, implants and grafts, sequela: Secondary | ICD-10-CM

## 2023-04-03 DIAGNOSIS — G8929 Other chronic pain: Secondary | ICD-10-CM

## 2023-04-03 MED ORDER — DICLOFENAC SODIUM 50 MG PO TBEC: 50.0000 mg | DELAYED_RELEASE_TABLET | Freq: Two times a day (BID) | ORAL | 0 refills | Status: DC

## 2023-04-03 NOTE — Progress Notes (Signed)
Patient: [name]  Service Category: E/M  Provider: Edward Jolly, MD  DOB: 08/03/1932  DOS: 04/03/2023  Location: Office  MRN: [id number]  Setting: Ambulatory outpatient  Referring Provider: Duanne Limerick, MD  Type: Established Patient  Specialty: Interventional Pain Management  PCP: Duanne Limerick, MD  Location: Remote location  Delivery: TeleHealth     Virtual Encounter - Pain Management PROVIDER NOTE: Information contained herein reflects review and annotations entered in association with encounter. Interpretation of such information and data should be left to medically-trained personnel. Information provided to patient can be located elsewhere in the medical record under "Patient Instructions". Document created using STT-dictation technology, any transcriptional errors that may result from process are unintentional.    Contact & Pharmacy Preferred: [phone] Home: [phone] (home) Mobile: [phone] (mobile) E-mail: No e-mail address on record  [facility] [address] [place], [place] - [address] [place] [address] Phone: [phone] Fax: [phone]  [id number] - [address] [place] - [address] [place] [address] Phone: [phone] Fax: [phone]  [id number] [place], [place] - [address] [place] [address] Phone: [phone] Fax: [phone]   Pre-screening  [name] offered "in-person" vs "virtual" encounter. She indicated preferring virtual for this encounter.   Reason COVID-19*  Social distancing based on CDC and AMA recommendations.   I contacted [name] on 04/03/2023 via telephone.      I clearly identified myself as Edward Jolly, MD. I verified that I was speaking with the correct person using two identifiers (Name: [name], and date of birth: 1932-03-25).  Consent I sought verbal advanced consent from [name] for virtual  visit interactions. I informed [name] of possible security and privacy concerns, risks, and limitations associated with providing "not-in-person" medical evaluation and management services. I also informed [name] of the availability of "in-person" appointments. Finally, I informed her that there would be a charge for the virtual visit and that she could be  personally, fully or partially, financially responsible for it. [name] expressed understanding and agreed to proceed.   Historic Elements   [name] is a 89 y.o. year old, female patient evaluated today after our last contact on 03/06/2023. [name]  has a past medical history of GERD (gastroesophageal reflux disease), Hypertension, Right knee DJD, and Type II diabetes mellitus (HCC). She also  has a past surgical history that includes Total knee arthroplasty (Left, 03/28/2011); Cervical disc surgery (12/28/2010); Total knee arthroplasty (Right, 08/19/2013); Joint replacement; Back surgery; Total knee arthroplasty (Right, 08/19/2013); and Cataract extraction (Bilateral, 03/2016). [name] has a current medication list which includes the following prescription(s): diclofenac, cyanocobalamin, docusate sodium, fluocinonide, furosemide, glipizide, glucose blood, latanoprost, loratadine, metformin, metoprolol tartrate, mometasone, montelukast, omeprazole, and triamcinolone cream. She  reports that she has never smoked. She has never used smokeless tobacco. She reports that she does not currently use alcohol. She reports that she does not use drugs. [name] is allergic to nsaids and lisinopril.  BMI: Estimated body mass index is 30.65 kg/m as calculated from the following:   Height as of 03/06/23: 5\' 3"  (1.6 m).   Weight as of 03/06/23: 173 lb (78.5 kg). Last encounter: 02/09/2023. Last procedure: 03/06/2023.  HPI  Today, she is being contacted for a post-procedure assessment.   Post-procedure evaluation   Procedure:  Glenohumeral Joint (shoulder) Injection #1  Laterality: Bilateral (-50)  Level: Shoulder  Imaging: Fluoroscopy-guided         Anesthesia: Local anesthesia (1-2% Lidocaine) DOS: 03/06/2023  Performed by: Edward Jolly, MD  Purpose: Diagnostic/Therapeutic Indications: Shoulder pain severe enough to impact quality of life or function. Rationale (medical necessity): procedure needed and proper for the diagnosis and/or treatment of [name]'s medical symptoms and needs. 1. Chronic pain of both shoulders   2. Primary osteoarthritis of both shoulders   3. Chronic pain syndrome    NAS-11 Pain score:   Pre-procedure: 5 /10   Post-procedure: 1 /10       Effectiveness:  Initial hour after procedure: 75 % Subsequent 4-6 hours post-procedure: 75 %  Analgesia past initial 6 hours: 100 %  Ongoing improvement:  Analgesic:  100% Function: [name] reports improvement in function ROM: [name] reports improvement in ROM   Laboratory Chemistry Profile   Renal Lab Results  Component Value Date   BUN 18 09/16/2022   CREATININE 0.51 09/16/2022   BCR 27 01/17/2022   GFRAA 91 03/18/2020   GFRNONAA >60 09/16/2022    Hepatic Lab Results  Component Value Date   AST 11 01/17/2022   ALT 5 01/17/2022   ALBUMIN 3.3 (L) 09/16/2022   ALKPHOS 85 01/17/2022    Electrolytes Lab Results  Component Value Date   NA 136 09/16/2022   K 4.1 09/16/2022   CL 107 09/16/2022   CALCIUM 8.9 09/16/2022   MG 1.7 05/09/2022   PHOS 3.7 09/16/2022    Bone No results found for: "VD25OH", "VD125OH2TOT", "ZO1096EA5", "WU9811BJ4", "25OHVITD1", "25OHVITD2", "25OHVITD3", "TESTOFREE", "TESTOSTERONE"  Inflammation (CRP: Acute Phase) (ESR: Chronic Phase) No results found for: "CRP", "ESRSEDRATE", "LATICACIDVEN"       Note: Above Lab results reviewed.  Assessment  The primary encounter diagnosis was Chronic pain of both shoulders. Diagnoses of Primary osteoarthritis of both shoulders, Chronic pain  syndrome, Chronic pain of left knee, and Pain due to total left knee replacement, sequela were also pertinent to this visit.  Plan of Care  1. Chronic pain of both shoulders  2. Primary osteoarthritis of both shoulders  3. Chronic pain syndrome  4. Chronic pain of left knee  5. Pain due to total left knee replacement, sequela  Requested Prescriptions   Signed Prescriptions Disp Refills   diclofenac (VOLTAREN) 50 MG EC tablet 30 tablet 0    Sig: Take 1 tablet (50 mg total) by mouth 2 (two) times daily for 15 days.   100% pain relief after bilateral shoulder injection.  Continues to endorse persistent and severe left knee pain with limited range of motion.  History of left knee replacement.  She has had a left knee genicular nerve radiofrequency ablation which was not very helpful.  Recommend that she do a short trial of p.o. Voltaren 50 mg twice daily after meal.  I encouraged her to refrain from any other NSAIDs including ibuprofen, naproxen while she is taking diclofenac.  Patient endorsed understanding.  Follow-up plan:   Return for patient will call to schedule F2F appt prn.      Left knee genicular NB 06/30/21, 08/11/21; RFA 10/06/21, B/L shoulder joint injection anterior 03/06/23: 100 % pain relief     Recent Visits Date Type Provider Dept  03/06/23 Procedure visit Edward Jolly, MD [facility]  02/09/23 Office Visit Edward Jolly, MD [facility]  Showing recent visits within past 90 days and meeting all other requirements Today's Visits Date Type Provider Dept  04/03/23 Office Visit Edward Jolly, MD [facility]  Showing today's visits and meeting all other requirements Future Appointments No visits were found meeting these conditions. Showing future appointments within next 90 days and meeting all other requirements  I discussed the assessment and treatment plan with the patient. The patient was provided an opportunity to ask questions and  all were answered. The patient agreed with the plan and demonstrated an understanding of the instructions.  Patient advised to call back or seek an in-person evaluation if the symptoms or condition worsens.  Duration of encounter: .  Note by: Edward Jolly, MD Date: 04/03/2023; Time: 3:16 PM

## 2023-04-25 ENCOUNTER — Other Ambulatory Visit: Payer: Self-pay | Admitting: *Deleted

## 2023-04-25 ENCOUNTER — Telehealth: Payer: Self-pay | Admitting: Student in an Organized Health Care Education/Training Program

## 2023-04-25 MED ORDER — DICLOFENAC SODIUM 50 MG PO TBEC: 50.0000 mg | DELAYED_RELEASE_TABLET | Freq: Two times a day (BID) | ORAL | 0 refills | Status: AC

## 2023-04-25 NOTE — Telephone Encounter (Signed)
PT called stated that she was told on her last appt by doctor Lateef to call back and he will do an vv appt for her medications.

## 2023-04-25 NOTE — Telephone Encounter (Signed)
Refill medication request sent to Dr. Cherylann Ratel.

## 2023-05-09 ENCOUNTER — Telehealth: Payer: Self-pay | Admitting: Family Medicine

## 2023-05-09 ENCOUNTER — Other Ambulatory Visit
Admission: RE | Admit: 2023-05-09 | Discharge: 2023-05-09 | Disposition: A | Discharge status: Home / Self Care | Payer: Medicare Other | Attending: Family Medicine | Admitting: Family Medicine

## 2023-05-09 ENCOUNTER — Encounter: Payer: Self-pay | Admitting: Family Medicine

## 2023-05-09 ENCOUNTER — Telehealth: Payer: Self-pay

## 2023-05-09 ENCOUNTER — Ambulatory Visit (INDEPENDENT_AMBULATORY_CARE_PROVIDER_SITE_OTHER): Payer: Medicare Other | Admitting: Family Medicine

## 2023-05-09 VITALS — BP 118/78 | HR 80 | Ht 63.0 in | Wt 165.0 lb

## 2023-05-09 DIAGNOSIS — Z7984 Long term (current) use of oral hypoglycemic drugs: Secondary | ICD-10-CM | POA: Diagnosis not present

## 2023-05-09 DIAGNOSIS — L89322 Pressure ulcer of left buttock, stage 2: Secondary | ICD-10-CM

## 2023-05-09 DIAGNOSIS — E119 Type 2 diabetes mellitus without complications: Secondary | ICD-10-CM

## 2023-05-09 LAB — HEMOGLOBIN A1C
Hgb A1c MFr Bld: 6 % — ABNORMAL HIGH (ref 4.8–5.6)
Mean Plasma Glucose: 125.5 mg/dL

## 2023-05-09 MED ORDER — METFORMIN HCL 500 MG PO TABS: 500.0000 mg | ORAL_TABLET | Freq: Two times a day (BID) | ORAL | 1 refills | Status: AC

## 2023-05-09 MED ORDER — GLIPIZIDE ER 2.5 MG PO TB24: 2.5000 mg | ORAL_TABLET | Freq: Every day | ORAL | 1 refills | Status: AC

## 2023-05-09 NOTE — Progress Notes (Signed)
Date:  05/09/2023   Name:  [name]   DOB:  [birth year]   MRN:  [id number]   Chief Complaint: Diabetes  Diabetes She presents for her follow-up diabetic visit. She has type 2 diabetes mellitus. Her disease course has been stable. There are no hypoglycemic associated symptoms. Pertinent negatives for hypoglycemia include no dizziness. There are no diabetic associated symptoms. Pertinent negatives for diabetes include no chest pain, no fatigue, no polydipsia, no polyuria, no weakness and no weight loss. There are no hypoglycemic complications. Symptoms are stable. There are no diabetic complications. Risk factors for coronary artery disease include diabetes mellitus. Current diabetic treatment includes oral agent (dual therapy). She is compliant with treatment most of the time.    Lab Results  Component Value Date   NA 136 09/16/2022   K 4.1 09/16/2022   CO2 24 09/16/2022   GLUCOSE 101 (H) 09/16/2022   BUN 18 09/16/2022   CREATININE 0.51 09/16/2022   CALCIUM 8.9 09/16/2022   EGFR 86 01/17/2022   GFRNONAA >60 09/16/2022   Lab Results  Component Value Date   CHOL 183 01/06/2023   HDL 60 01/06/2023   LDLCALC 104 (H) 01/06/2023   TRIG 109 01/06/2023   CHOLHDL 2.2 07/23/2019   Lab Results  Component Value Date   TSH 1.800 05/09/2022   Lab Results  Component Value Date   HGBA1C 6.5 (H) 01/06/2023   Lab Results  Component Value Date   WBC 5.4 04/13/2021   HGB 10.6 (L) 04/13/2021   HCT 33.3 (L) 04/13/2021   MCV 84.3 04/13/2021   PLT 215 04/13/2021   Lab Results  Component Value Date   ALT 5 01/17/2022   AST 11 01/17/2022   ALKPHOS 85 01/17/2022   BILITOT 0.3 01/17/2022   No results found for: "25OHVITD2", "25OHVITD3", "VD25OH"   Review of Systems  Constitutional:  Negative for fatigue, fever and weight loss.  Respiratory:  Negative for cough, chest tightness, shortness of breath and wheezing.   Cardiovascular:  Negative for chest pain and palpitations.   Endocrine: Negative for polydipsia and polyuria.  Skin:  Positive for wound.  Neurological:  Negative for dizziness and weakness.    Patient Active Problem List   Diagnosis Date Noted   Valvular heart disease 07/23/2021   Chronic pain of left knee 06/17/2021   Chronic pain syndrome 06/17/2021   Near syncope 04/12/2021   Pain due to total left knee replacement (HCC) 03/02/2021   Aortic valve regurgitation 10/23/2020   Bradycardia 09/04/2019   PVC's (premature ventricular contractions) 09/04/2019   Leg edema 09/04/2019   Sinus bradycardia 03/16/2018   PSVT (paroxysmal supraventricular tachycardia) 03/16/2018   NSVT (nonsustained ventricular tachycardia) (HCC) 03/16/2018   Malignant HTN with heart disease, w/o CHF, w/o chronic kidney disease 02/05/2018   Hyperlipidemia 09/24/2015   Diabetes mellitus with no complication (HCC) 03/16/2015   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   Arthritis of knee, degenerative 03/16/2015   Sinus infection 03/16/2015   Diabetes (HCC)    Essential hypertension    GERD (gastroesophageal reflux disease)    History of bilateral knee replacement     Allergies  Allergen Reactions   Nsaids    Lisinopril     Cough    Past Surgical History:  Procedure Laterality Date   BACK SURGERY     CATARACT EXTRACTION Bilateral 03/2016   CERVICAL DISC SURGERY  12/28/2010   "bulging disc; went in on the left side of my neck" (08/19/2013)   JOINT  REPLACEMENT     TOTAL KNEE ARTHROPLASTY Left 03/28/2011   Dr Thurston Hole   TOTAL KNEE ARTHROPLASTY Right 08/19/2013   TOTAL KNEE ARTHROPLASTY Right 08/19/2013   Procedure: TOTAL KNEE ARTHROPLASTY;  Surgeon: Nilda Simmer, MD;  Location: [facility];  Service: Orthopedics;  Laterality: Right;    Social History   Tobacco Use   Smoking status: Never   Smokeless tobacco: Never   Tobacco comments:    Smoking cessation materials not required  Vaping Use   Vaping Use: Never used  Substance Use Topics    Alcohol use: Not Currently   Drug use: Never     Medication list has been reviewed and updated.  Current Meds  Medication Sig   cyanocobalamin 100 MCG tablet Take 1 tablet by mouth daily.    diclofenac (VOLTAREN) 50 MG EC tablet Take 1 tablet (50 mg total) by mouth 2 (two) times daily for 15 days.   docusate sodium (COLACE) 50 MG capsule Take 1 capsule (50 mg total) by mouth 2 (two) times daily.   fluocinonide (LIDEX) 0.05 % external solution Apply 1 Application topically daily.   furosemide (LASIX) 20 MG tablet Take 1 tablet (20 mg total) by mouth daily. Decrease dosing to three times a week   glipiZIDE (GLUCOTROL XL) 2.5 MG 24 hr tablet Take 1 tablet (2.5 mg total) by mouth daily with breakfast.   glucose blood (ONE TOUCH ULTRA TEST) test strip USE 1 STRIP TO CHECK GLUCOSE ONCE DAILY   latanoprost (XALATAN) 0.005 % ophthalmic solution Place 1 drop into both eyes at bedtime.   loratadine (CLARITIN) 10 MG tablet Take 1 tablet (10 mg total) by mouth daily.   metFORMIN (GLUCOPHAGE) 500 MG tablet Take 1 tablet (500 mg total) by mouth 2 (two) times daily with a meal.   metoprolol tartrate (LOPRESSOR) 25 MG tablet Take 1 tablet (25 mg total) by mouth 2 (two) times daily.   mometasone (NASONEX) 50 MCG/ACT nasal spray Use 2 spray(s) in each nostril once daily   montelukast (SINGULAIR) 10 MG tablet Take 1 tablet (10 mg total) by mouth at bedtime.   omeprazole (PRILOSEC) 20 MG capsule Take 1 capsule (20 mg total) by mouth daily.   triamcinolone cream (KENALOG) 0.1 % Apply 1 Application topically 2 (two) times daily.       01/06/2023    1:35 PM 11/28/2022    2:20 PM 08/10/2022   11:24 AM 07/12/2022    3:09 PM  GAD 7 : Generalized Anxiety Score  Nervous, Anxious, on Edge 0 0 0 0  Control/stop worrying 0 0 0 0  Worry too much - different things 0 0 0 0  Trouble relaxing 0 0 0 0  Restless 0 0 0 0  Easily annoyed or irritable 0 0 0 0  Afraid - awful might happen 0 0 0 0  Total GAD 7 Score 0 0 0  0  Anxiety Difficulty Not difficult at all Not difficult at all Not difficult at all Not difficult at all       01/06/2023    1:35 PM 11/28/2022    2:20 PM 08/26/2022   11:05 AM  Depression screen PHQ 2/9  Decreased Interest 0 0 0  Down, Depressed, Hopeless 0 0 0  PHQ - 2 Score 0 0 0  Altered sleeping 0 0   Tired, decreased energy 0 0   Change in appetite 0 0   Feeling bad or failure about yourself  0 0   Trouble concentrating 0  0   Moving slowly or fidgety/restless 0 0   Suicidal thoughts 0 0   PHQ-9 Score 0 0   Difficult doing work/chores Not difficult at all Not difficult at all     BP Readings from Last 3 Encounters:  05/09/23 118/78  03/06/23 (!) 200/95  02/09/23 (!) 182/67    Physical Exam Vitals and nursing note reviewed. Exam conducted with a chaperone present.  Constitutional:      General: She is not in acute distress.    Appearance: She is not diaphoretic.  HENT:     Head: Normocephalic and atraumatic.     Right Ear: Tympanic membrane and external ear normal.     Left Ear: Tympanic membrane and external ear normal.     Nose: Nose normal.  Eyes:     General:        Right eye: No discharge.        Left eye: No discharge.     Conjunctiva/sclera: Conjunctivae normal.     Pupils: Pupils are equal, round, and reactive to light.  Neck:     Thyroid: No thyromegaly.     Vascular: No JVD.  Cardiovascular:     Rate and Rhythm: Normal rate and regular rhythm.     Heart sounds: Normal heart sounds, S1 normal and S2 normal. No murmur heard.    No systolic murmur is present.     No diastolic murmur is present.     No friction rub. No gallop. No S3 or S4 sounds.  Pulmonary:     Effort: Pulmonary effort is normal.     Breath sounds: Normal breath sounds. No decreased air movement. No decreased breath sounds, wheezing or rhonchi.  Abdominal:     General: Bowel sounds are normal.     Palpations: Abdomen is soft. There is no mass.     Tenderness: There is no abdominal  tenderness. There is no guarding.  Musculoskeletal:        General: Normal range of motion.     Cervical back: Normal range of motion and neck supple.  Lymphadenopathy:     Cervical: No cervical adenopathy.  Skin:    General: Skin is warm and dry.  Neurological:     Mental Status: She is alert.     Deep Tendon Reflexes: Reflexes are normal and symmetric.     Wt Readings from Last 3 Encounters:  05/09/23 165 lb (74.8 kg)  03/06/23 173 lb (78.5 kg)  02/09/23 173 lb (78.5 kg)    BP 118/78   Pulse 80   Ht 5\' 3"  (1.6 m)   Wt 165 lb (74.8 kg)   SpO2 95%   BMI 29.23 kg/m   Assessment and Plan:  1. Type 2 diabetes mellitus without complication, without long-term current use of insulin (HCC) Chronic.  Controlled.  Stable.  Continue glipizide XL 2.5 mg once a day and metformin 500 mg twice a day.  Will check A1c for current level of control. - glipiZIDE (GLUCOTROL XL) 2.5 MG 24 hr tablet; Take 1 tablet (2.5 mg total) by mouth daily with breakfast.  Dispense: 90 tablet; Refill: 1 - metFORMIN (GLUCOPHAGE) 500 MG tablet; Take 1 tablet (500 mg total) by mouth 2 (two) times daily with a meal.  Dispense: 180 tablet; Refill: 1 - HgB A1c  2. Pressure injury of left buttock, stage 2 (HCC) Patient's family brings a picture of a pressure ulcer on the left buttock that appears to be down at least in the dermis  and may be involving the fat.  This is of concern and I have discussed with patient that it likely needs debridement and definitely wound care level involvement and not just application of "butt cream ".  Referral has been placed with wound care center and we will call to see if we can expedite evaluation. - AMB referral to wound care center    Elizabeth Sauer, MD

## 2023-05-09 NOTE — Telephone Encounter (Signed)
Copied from CRM [id number]. Topic: General - Other >> May 09, 2023  3:06 PM Franchot Heidelberg wrote: Reason for CRM: Pt's daughter called requesting to speak to Delice Bison regarding the patient [phone]. Concerning the patient's appt with the wound clinic.

## 2023-05-09 NOTE — Telephone Encounter (Signed)
I tried to call pt back- no answer. I am aware the appt with wound care is July 2nd, but they are putting her on a cancellation list.

## 2023-05-11 ENCOUNTER — Telehealth: Payer: Self-pay | Admitting: Family Medicine

## 2023-05-11 NOTE — Telephone Encounter (Signed)
Copied from CRM [id number]. Topic: General - Other >> May 11, 2023  3:40 PM Ja-Kwan M wrote: Reason for CRM: Pt requests that Delice Bison return her call. Cb# [phone]

## 2023-05-12 ENCOUNTER — Ambulatory Visit: Payer: Medicare Other | Admitting: Family Medicine

## 2023-05-12 ENCOUNTER — Emergency Department
Admission: EM | Admit: 2023-05-12 | Discharge: 2023-05-12 | Disposition: A | Discharge status: Home / Self Care | Payer: Medicare Other | Attending: Emergency Medicine | Admitting: Emergency Medicine

## 2023-05-12 DIAGNOSIS — L89302 Pressure ulcer of unspecified buttock, stage 2: Secondary | ICD-10-CM | POA: Diagnosis not present

## 2023-05-12 DIAGNOSIS — E119 Type 2 diabetes mellitus without complications: Secondary | ICD-10-CM | POA: Insufficient documentation

## 2023-05-12 DIAGNOSIS — I10 Essential (primary) hypertension: Secondary | ICD-10-CM | POA: Insufficient documentation

## 2023-05-12 DIAGNOSIS — L89322 Pressure ulcer of left buttock, stage 2: Secondary | ICD-10-CM | POA: Insufficient documentation

## 2023-05-12 DIAGNOSIS — L89312 Pressure ulcer of right buttock, stage 2: Secondary | ICD-10-CM | POA: Insufficient documentation

## 2023-05-12 DIAGNOSIS — E11622 Type 2 diabetes mellitus with other skin ulcer: Secondary | ICD-10-CM | POA: Diagnosis not present

## 2023-05-12 LAB — CBC WITH DIFFERENTIAL/PLATELET
Abs Immature Granulocytes: 0.02 10*3/uL (ref 0.00–0.07)
Basophils Absolute: 0.1 10*3/uL (ref 0.0–0.1)
Basophils Relative: 1 %
Eosinophils Absolute: 0.3 10*3/uL (ref 0.0–0.5)
Eosinophils Relative: 4 %
HCT: 30.5 % — ABNORMAL LOW (ref 36.0–46.0)
Hemoglobin: 9.2 g/dL — ABNORMAL LOW (ref 12.0–15.0)
Immature Granulocytes: 0 %
Lymphocytes Relative: 28 %
Lymphs Abs: 2.2 10*3/uL (ref 0.7–4.0)
MCH: 25.6 pg — ABNORMAL LOW (ref 26.0–34.0)
MCHC: 30.2 g/dL (ref 30.0–36.0)
MCV: 84.7 fL (ref 80.0–100.0)
Monocytes Absolute: 0.7 10*3/uL (ref 0.1–1.0)
Monocytes Relative: 9 %
Neutro Abs: 4.7 10*3/uL (ref 1.7–7.7)
Neutrophils Relative %: 58 %
Platelets: 395 10*3/uL (ref 150–400)
RBC: 3.6 MIL/uL — ABNORMAL LOW (ref 3.87–5.11)
RDW: 15.9 % — ABNORMAL HIGH (ref 11.5–15.5)
WBC: 7.9 10*3/uL (ref 4.0–10.5)
nRBC: 0 % (ref 0.0–0.2)

## 2023-05-12 LAB — BASIC METABOLIC PANEL
Anion gap: 10 (ref 5–15)
BUN: 21 mg/dL (ref 8–23)
CO2: 19 mmol/L — ABNORMAL LOW (ref 22–32)
Calcium: 8.6 mg/dL — ABNORMAL LOW (ref 8.9–10.3)
Chloride: 105 mmol/L (ref 98–111)
Creatinine, Ser: 0.59 mg/dL (ref 0.44–1.00)
GFR, Estimated: >60 mL/min (ref >60)
Glucose, Bld: 64 mg/dL — ABNORMAL LOW (ref 70–99)
Potassium: 6 mmol/L — ABNORMAL HIGH (ref 3.5–5.1)
Sodium: 134 mmol/L — ABNORMAL LOW (ref 135–145)

## 2023-05-12 LAB — POTASSIUM: Potassium: 4.5 mmol/L (ref 3.5–5.1)

## 2023-05-12 MED ORDER — TRAMADOL HCL 50 MG PO TABS
50.0000 mg | ORAL_TABLET | Freq: Once | ORAL | Status: AC
  Administered 2023-05-12: 50 mg via ORAL
  Filled 2023-05-12: qty 1

## 2023-05-12 MED ORDER — TRAMADOL HCL 50 MG PO TABS: 50.0000 mg | ORAL_TABLET | Freq: Two times a day (BID) | ORAL | 0 refills | Status: AC

## 2023-05-12 NOTE — ED Triage Notes (Signed)
Pt sts that her PCP thinks that pt has a staff infection in her sacral wound and wanted to her to be seen in the ED.

## 2023-05-12 NOTE — Discharge Instructions (Addendum)
[name] has a normal exam at this time.  No lab abnormalities were found.  Her pressure wounds are stable without signs of secondary infection.  It is important that she change positions often, and be repositioned in her bed and/or chair, to reduce pressure on the bottom.  Use the wound dressings daily as demonstrated to help reduce skin breakdown.  Consider the use of a gel cushion seat pad for her wheelchair and reclining chair.  Also consider a gel cushion/memory foam mattress Topper for the bed.  Follow-up with [facility] wound care center as scheduled.

## 2023-05-12 NOTE — ED Provider Notes (Signed)
[facility] Emergency Department Provider Note     Event Date/Time   First MD Initiated Contact with Patient 05/12/23 1302     (approximate)   History   Wound Infection   HPI  [name] is a 89 y.o. female with a history of diabetes, HTN, GERD, degenerative joint disease of the knees, and poor mobility, presents to the ED accompanied by her adult daughters.  Patient is under the care of her primary provider, who suggested that she present to the ED for evaluation of recurrent sacral pressure wounds.  Patient who spends her day either in her bed, or hitting her reclining chair, has had history of the same in the past patient previously been evaluated and managed by the [facility] wound care center.  After the wounds were sufficiently healed, she was discharged in their care.  It has been close to a year if not more since she was evaluated by them.  Her daughters had been applying topical ointment to the wounds, but currently have not been doing any particular wound care for the also admittedly have not been thoroughly educated on preventing pressure wounds in terms of positioning and turning the patient regularly.  No reports of any fevers, chills, or sweats.  No purulent drainage from the wounds is reported.  Patient denies any significant bladder or bowel incontinence.   Physical Exam   Triage Vital Signs: ED Triage Vitals  Enc Vitals Group     BP 05/12/23 1240 (!) 171/59     Pulse Rate 05/12/23 1240 (!) 56     Resp 05/12/23 1240 17     Temp 05/12/23 1240 98.2 F (36.8 C)     Temp Source 05/12/23 1240 Oral     SpO2 05/12/23 1240 94 %     Weight 05/12/23 1241 160 lb (72.6 kg)     Height 05/12/23 1241 5\' 3"  (1.6 m)     Head Circumference --      Peak Flow --      Pain Score 05/12/23 1241 6     Pain Loc --      Pain Edu? --      Excl. in GC? --     Most recent vital signs: Vitals:   05/12/23 1240 05/12/23 1629  BP: (!) 171/59 (!) 195/79  Pulse:  (!) 56 67  Resp: 17 18  Temp: 98.2 F (36.8 C)   SpO2: 94% 94%    General Awake, no distress. NAD CV:  Good peripheral perfusion.  RESP:  Normal effort.  ABD:  No distention.  SKIN:  Patient with sacral wounds noted to the ischial rami bilaterally to the buttocks.  Wounds are likely grade 2 at this time with breakdown of the epidermal skin.  Extension to the subcu is appreciated without evidence of extension to the deep fat layer muscle layer.  No warmth, purulence, drainage, weeping, induration, or erythema is appreciated.  The wounds measure approximately 4 x 3 cm each.  ED Results / Procedures / Treatments   Labs (all labs ordered are listed, but only abnormal results are displayed) Labs Reviewed  BASIC METABOLIC PANEL - Abnormal; Notable for the following components:      Result Value   Sodium 134 (*)    Potassium 6.0 (*)    CO2 19 (*)    Glucose, Bld 64 (*)    Calcium 8.6 (*)    All other components within normal limits  CBC WITH DIFFERENTIAL/PLATELET - Abnormal;  Notable for the following components:   RBC 3.60 (*)    Hemoglobin 9.2 (*)    HCT 30.5 (*)    MCH 25.6 (*)    RDW 15.9 (*)    All other components within normal limits  POTASSIUM     EKG  RADIOLOGY  No results found.   PROCEDURES:  Critical Care performed: No  Procedures   MEDICATIONS ORDERED IN ED: Medications  traMADol (ULTRAM) tablet 50 mg (50 mg Oral Given 05/12/23 1425)     IMPRESSION / MDM / ASSESSMENT AND PLAN / ED COURSE  I reviewed the triage vital signs and the nursing notes.                              Differential diagnosis includes, but is not limited to, pressure ulcers, sacral wounds, stasis ulcers, cellulitis, abscess, Fournier's gangrene  Patient's presentation is most consistent with acute presentation with potential threat to life or bodily function.  Patient's diagnosis is consistent with bilateral ischial rami pressure ulcers likely stage II.  Patient with reassuring  exam overall.  Labs are reassuring as is shows no acute leukocytosis.  Initial potassium read at 6, likely due to hemolyzation.  Repeat potassium normalized at 4.0 consistent with patient's baseline and history.  No evidence of sepsis as patient is reassuring vital signs, and normal white count.  Pressure injuries to the buttocks did not reveal any acute cellulitis, induration, warmth, or purulence.  Dry wound beds are covered with Mediplex dressing as appropriate.  Patient will be discharged home with wound care instructions.  They are also given information on gel pressure pads for both her seat in her bed.  Caregivers also given instruction on position changes regularly to help prevent pressure injury progression.  Patient is to follow up with [facility] wound care center as planned, as needed or otherwise directed. Patient is given ED precautions to return to the ED for any worsening or new symptoms.   FINAL CLINICAL IMPRESSION(S) / ED DIAGNOSES   Final diagnoses:  Pressure injury of buttock, stage 2, unspecified laterality (HCC)     Rx / DC Orders   ED Discharge Orders          Ordered    Potassium  Status:  Canceled        05/12/23 1507    traMADol (ULTRAM) 50 MG tablet  2 times daily        05/12/23 1555             Note:  This document was prepared using Dragon voice recognition software and may include unintentional dictation errors.    Lissa Hoard, PA-C 05/12/23 1901    Willy Eddy, MD 05/12/23 Jerene Bears

## 2023-05-12 NOTE — ED Notes (Signed)
Mepilex dressing applied to both side of buttock

## 2023-05-15 ENCOUNTER — Other Ambulatory Visit: Payer: Self-pay

## 2023-05-15 ENCOUNTER — Telehealth: Payer: Self-pay

## 2023-05-15 DIAGNOSIS — E119 Type 2 diabetes mellitus without complications: Secondary | ICD-10-CM

## 2023-05-15 DIAGNOSIS — L89322 Pressure ulcer of left buttock, stage 2: Secondary | ICD-10-CM

## 2023-05-15 DIAGNOSIS — I872 Venous insufficiency (chronic) (peripheral): Secondary | ICD-10-CM

## 2023-05-15 NOTE — Transitions of Care (Post Inpatient/ED Visit) (Signed)
05/15/2023  Name: [name] MRN: [id number] DOB: 07-11-1932  Today's TOC FU Call Status: Today's TOC FU Call Status:: Successful TOC FU Call Competed TOC FU Call Complete Date: 05/15/23  Transition Care Management Follow-up Telephone Call Date of Discharge: 05/12/23 Discharge Facility: [facility]) Type of Discharge: Emergency Department Reason for ED Visit:  (wound care) How have you been since you were released from the hospital?: Same Any questions or concerns?: No  Items Reviewed: Did you receive and understand the discharge instructions provided?: Yes Medications obtained,verified, and reconciled?: Yes (Medications Reviewed) Any new allergies since your discharge?: No Dietary orders reviewed?: NA Do you have support at home?: Yes People in Home: child(ren), adult Name of Support/Comfort Primary Source: [name]  Medications Reviewed Today: Medications Reviewed Today     Reviewed by Everitt Amber (Medical Assistant) on 05/15/23 at 1321  Med List Status: <None>   Medication Order Taking? Sig Documenting Provider Last Dose Status Informant  cyanocobalamin 100 MCG tablet [id number] Yes Take 1 tablet by mouth daily.  [provider] Taking Active Family Member           Med Note [name] Feb 05, 2018  3:51 PM)    docusate sodium (COLACE) 50 MG capsule [id number] Yes Take 1 capsule (50 mg total) by mouth 2 (two) times daily. Duanne Limerick, MD Taking Active Family Member  fluocinonide (LIDEX) 0.05 % external solution [id number] Yes Apply 1 Application topically daily. [provider] Taking Active   furosemide (LASIX) 20 MG tablet [id number] Yes Take 1 tablet (20 mg total) by mouth daily. Decrease dosing to three times a week Duanne Limerick, MD Taking Active   glipiZIDE (GLUCOTROL XL) 2.5 MG 24 hr tablet [id number] Yes Take 1 tablet (2.5 mg total) by mouth daily with breakfast. Duanne Limerick, MD Taking Active   glucose  blood (ONE TOUCH ULTRA TEST) test strip [id number] Yes USE 1 STRIP TO CHECK GLUCOSE ONCE DAILY Duanne Limerick, MD Taking Active Family Member  latanoprost (XALATAN) 0.005 % ophthalmic solution [id number] Yes Place 1 drop into both eyes at bedtime. [provider] Taking Active   loratadine (CLARITIN) 10 MG tablet [id number] Yes Take 1 tablet (10 mg total) by mouth daily. Duanne Limerick, MD Taking Active   metFORMIN (GLUCOPHAGE) 500 MG tablet [id number] Yes Take 1 tablet (500 mg total) by mouth 2 (two) times daily with a meal. Duanne Limerick, MD Taking Active   metoprolol tartrate (LOPRESSOR) 25 MG tablet [id number] Yes Take 1 tablet (25 mg total) by mouth 2 (two) times daily. Lanier Prude, MD Taking Active   mometasone (NASONEX) 50 MCG/ACT nasal spray [id number] Yes Use 2 spray(s) in each nostril once daily Duanne Limerick, MD Taking Active   montelukast (SINGULAIR) 10 MG tablet [id number] Yes Take 1 tablet (10 mg total) by mouth at bedtime. Duanne Limerick, MD Taking Active   omeprazole (PRILOSEC) 20 MG capsule [id number] Yes Take 1 capsule (20 mg total) by mouth daily. Duanne Limerick, MD Taking Active   traMADol Janean Sark) 50 MG tablet [id number] No Take 1 tablet (50 mg total) by mouth 2 (two) times daily for 7 days.  Patient not taking: Reported on 05/15/2023   Karmen Stabs Charlesetta Ivory, PA-C Not Taking Active   triamcinolone cream (KENALOG) 0.1 % [id number] Yes Apply 1 Application topically 2 (two) times daily. Duanne Limerick, MD Taking Active  Home Care and Equipment/Supplies: Were Home Health Services Ordered?: NA Any new equipment or medical supplies ordered?: No  Functional Questionnaire: Do you need assistance with bathing/showering or dressing?: Yes Do you need assistance with meal preparation?: Yes Do you need assistance with eating?: No Do you have difficulty maintaining continence: Yes Do you need assistance with getting out of bed/getting out of a  chair/moving?: No Do you have difficulty managing or taking your medications?: Yes  Follow up appointments reviewed: PCP Follow-up appointment confirmed?: Yes Date of PCP follow-up appointment?: 05/19/23 Follow-up Provider: [facility] Follow-up appointment confirmed?: Yes Date of Specialist follow-up appointment?: 05/23/23 Follow-Up Specialty Provider:: wound care Do you need transportation to your follow-up appointment?: No Do you understand care options if your condition(s) worsen?: Yes-patient verbalized understanding    SIGNATURE [name]

## 2023-05-16 ENCOUNTER — Telehealth: Payer: Self-pay

## 2023-05-16 NOTE — Telephone Encounter (Signed)
Called [name]/ daughter concerning pt's home health referral. Runell Gess wanted me to talk to Butch/ brother and explain the situation. I spoke to [facility] [phone] and explained the process of HH and the reason behind it. I spent  approx 30 minutes on phone with Butch. Also called [name] back to let her know I had been given the go ahead by [facility] and the ref was being placed.

## 2023-05-18 ENCOUNTER — Telehealth: Payer: Self-pay | Admitting: Family Medicine

## 2023-05-18 NOTE — Telephone Encounter (Signed)
[name] with [facility] called to say they can not provide Home Health Care for this patient.  They are full and have no one to provide care right now,.  They are responding to an order from the provider.

## 2023-05-19 ENCOUNTER — Encounter: Payer: Self-pay | Admitting: Family Medicine

## 2023-05-19 ENCOUNTER — Ambulatory Visit (INDEPENDENT_AMBULATORY_CARE_PROVIDER_SITE_OTHER): Payer: Medicare Other | Admitting: Family Medicine

## 2023-05-19 VITALS — BP 120/70 | HR 55 | Ht 63.0 in | Wt 165.0 lb

## 2023-05-19 DIAGNOSIS — I10 Essential (primary) hypertension: Secondary | ICD-10-CM | POA: Diagnosis not present

## 2023-05-19 DIAGNOSIS — Z7984 Long term (current) use of oral hypoglycemic drugs: Secondary | ICD-10-CM

## 2023-05-19 DIAGNOSIS — R32 Unspecified urinary incontinence: Secondary | ICD-10-CM

## 2023-05-19 DIAGNOSIS — L89322 Pressure ulcer of left buttock, stage 2: Secondary | ICD-10-CM | POA: Diagnosis not present

## 2023-05-19 DIAGNOSIS — E119 Type 2 diabetes mellitus without complications: Secondary | ICD-10-CM

## 2023-05-19 NOTE — Patient Instructions (Signed)
Preventing Pressure Injuries  A pressure injury, also called a pressure ulcer or bedsore, is an injury to the skin and the tissue under the skin that is caused by pressure. It can happen when your skin presses against a surface, such as a mattress or the seat of a wheelchair, for too long. The pressure on the blood vessels causes reduced blood flow to your skin. Over time, this can make the tissue die and break down, causing a wound. Pressure injuries often occur: Over bony parts of the body, such as the tailbone, shoulders, elbows, hips, heels, spine, ankles, and back of the head. Under medical devices, such as stockings, equipment to help with breathing, tubes, and splints. Inside the mouth or nose from dentures or tubes. How can pressure injuries affect me? Pressure injuries are caused by a lack of blood supply to an area of the skin. These injuries begin as a red or dark area on the skin and can become an open sore. They can come from a lot of pressure on the skin over a short period of time or from less pressure over a long period of time. Pressure injuries may be mild, moderate, or severe. They can cause pain, damage to your muscles, and infection. What can increase my risk for pressure injuries? You are more likely to develop this condition if: You are in the hospital or an extended care facility. You are bedridden or in a wheelchair. You have an injury or disease that keeps you from: Moving like normal. Feeling pain or pressure. Telling someone if you feel pain or pressure. You have a condition that: Makes you sleepy or less alert. Causes poor blood flow. You need to wear a medical device. You have poor control of your bladder or bowel movements (incontinence). You are not getting enough fluid or nutrients (malnutrition). You have had this condition before. What actions can I take to prevent pressure injuries? Reducing pressure Do not lie or sit in one position for a long time.  Move or change position as often as told by your health care provider. You may need to move: Every hour when out of bed in a chair. Every 2 hours when in bed. Use pillows or cushions to reduce pressure on certain areas of the body. Ask your health care provider to recommend a mattress, mattress cover, cushions, pads, or heel protectors. These may include products filled with air, foam, gel, or sand. Use medical devices that do not rub your skin. Tell your health care provider if one of your medical devices is causing pain or irritation. Skin care in the hospital If you are in the hospital, your health care providers will help you care for your skin. They may: Inspect your skin at least twice a day. This includes the areas under or around medical devices. Assess your nutrition and consult a dietitian if needed. Perform regular wound care. Help you move into a different position every few hours and adjust any medical devices. Keep your skin clean and dry. Use gentle cleansers and skin protectants if you are incontinent. Moisturize your dry skin. Skin care at home  Keep your skin clean and dry. Gently pat your skin dry. Do not rub or massage skin that is on bony areas. Moisturize dry skin. Use foams, creams, or powders to protect your skin from sweat, urine, and stool and reduce rubbing (friction) on the skin. Check your skin every day for any changes in color or any new blisters or sores. Check  under and around any medical devices and between skinfolds. Have a caregiver do this for you if you are not able. Nutrition  Drink enough fluid to keep your urine pale yellow. Eat a healthy diet that includes protein, vitamins, and minerals. Ask your health care provider what types of food you should eat. Lifestyle Do not use drugs or drink alcohol. Do not use any products that contain nicotine or tobacco. These products include cigarettes, chewing tobacco, and vaping devices, such as e-cigarettes. If  you need help quitting, ask your health care provider. Try to be active every day. Ask your health care provider what exercises or activities are safe for you. General instructions Take over-the-counter and prescription medicines only as told by your health care provider. Keep all follow-up visits. Work with your health care provider to manage any long-term (chronic) conditions. Where to find more information The National Pressure Injury Advisory Panel (NPIAP): npiap.com Contact a health care provider if: You feel or see any changes to your skin. This information is not intended to replace advice given to you by your health care provider. Make sure you discuss any questions you have with your health care provider. Document Revised: 04/08/2022 Document Reviewed: 04/08/2022 Elsevier Patient Education  2024 [employer].

## 2023-05-19 NOTE — Progress Notes (Unsigned)
Date:  05/19/2023   Name:  [name]   DOB:  April 21, 1932   MRN:  [id number]   Chief Complaint: ER follow up Micah Flesher to ER on 05/12/23/ TOC on 05/15/23)  Patient is a 89year old female who presents for a pressure ulcer discussion. The patient reports the following problems: Urinary incontinence/arthritis of the knees. Health maintenance has been reviewed up-to-date      Lab Results  Component Value Date   NA 134 (L) 05/12/2023   K 4.5 05/12/2023   CO2 19 (L) 05/12/2023   GLUCOSE 64 (L) 05/12/2023   BUN 21 05/12/2023   CREATININE 0.59 05/12/2023   CALCIUM 8.6 (L) 05/12/2023   EGFR 86 01/17/2022   GFRNONAA >60 05/12/2023   Lab Results  Component Value Date   CHOL 183 01/06/2023   HDL 60 01/06/2023   LDLCALC 104 (H) 01/06/2023   TRIG 109 01/06/2023   CHOLHDL 2.2 07/23/2019   Lab Results  Component Value Date   TSH 1.800 05/09/2022   Lab Results  Component Value Date   HGBA1C 6.0 (H) 05/09/2023   Lab Results  Component Value Date   WBC 7.9 05/12/2023   HGB 9.2 (L) 05/12/2023   HCT 30.5 (L) 05/12/2023   MCV 84.7 05/12/2023   PLT 395 05/12/2023   Lab Results  Component Value Date   ALT 5 01/17/2022   AST 11 01/17/2022   ALKPHOS 85 01/17/2022   BILITOT 0.3 01/17/2022   No results found for: "25OHVITD2", "25OHVITD3", "VD25OH"   Review of Systems  HENT:  Negative for congestion.   Respiratory:  Negative for shortness of breath and wheezing.   Cardiovascular:  Negative for chest pain, palpitations and leg swelling.  Endocrine: Negative for polydipsia and polyuria.  Genitourinary:  Positive for enuresis and frequency.  Musculoskeletal:  Positive for arthralgias.    Patient Active Problem List   Diagnosis Date Noted   Valvular heart disease 07/23/2021   Chronic pain of left knee 06/17/2021   Chronic pain syndrome 06/17/2021   Near syncope 04/12/2021   Pain due to total left knee replacement (HCC) 03/02/2021   Aortic valve regurgitation 10/23/2020    Bradycardia 09/04/2019   PVC's (premature ventricular contractions) 09/04/2019   Leg edema 09/04/2019   Sinus bradycardia 03/16/2018   PSVT (paroxysmal supraventricular tachycardia) 03/16/2018   NSVT (nonsustained ventricular tachycardia) (HCC) 03/16/2018   Malignant HTN with heart disease, w/o CHF, w/o chronic kidney disease 02/05/2018   Hyperlipidemia 09/24/2015   Diabetes mellitus with no complication (HCC) 03/16/2015   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   Arthritis of knee, degenerative 03/16/2015   Sinus infection 03/16/2015   Diabetes (HCC)    Essential hypertension    GERD (gastroesophageal reflux disease)    History of bilateral knee replacement     Allergies  Allergen Reactions   Nsaids    Lisinopril     Cough    Past Surgical History:  Procedure Laterality Date   BACK SURGERY     CATARACT EXTRACTION Bilateral 03/2016   CERVICAL DISC SURGERY  12/28/2010   "bulging disc; went in on the left side of my neck" (08/19/2013)   JOINT REPLACEMENT     TOTAL KNEE ARTHROPLASTY Left 03/28/2011   Dr Thurston Hole   TOTAL KNEE ARTHROPLASTY Right 08/19/2013   TOTAL KNEE ARTHROPLASTY Right 08/19/2013   Procedure: TOTAL KNEE ARTHROPLASTY;  Surgeon: Nilda Simmer, MD;  Location: [facility] OR;  Service: Orthopedics;  Laterality: Right;    Social History  Tobacco Use   Smoking status: Never   Smokeless tobacco: Never   Tobacco comments:    Smoking cessation materials not required  Vaping Use   Vaping Use: Never used  Substance Use Topics   Alcohol use: Not Currently   Drug use: Never     Medication list has been reviewed and updated.  No outpatient medications have been marked as taking for the 05/19/23 encounter (Office Visit) with Duanne Limerick, MD.       05/19/2023    3:52 PM 01/06/2023    1:35 PM 11/28/2022    2:20 PM 08/10/2022   11:24 AM  GAD 7 : Generalized Anxiety Score  Nervous, Anxious, on Edge 0 0 0 0  Control/stop worrying 0 0 0 0  Worry too  much - different things 0 0 0 0  Trouble relaxing 0 0 0 0  Restless 0 0 0 0  Easily annoyed or irritable 0 0 0 0  Afraid - awful might happen 0 0 0 0  Total GAD 7 Score 0 0 0 0  Anxiety Difficulty Not difficult at all Not difficult at all Not difficult at all Not difficult at all       05/19/2023    3:52 PM 01/06/2023    1:35 PM 11/28/2022    2:20 PM  Depression screen PHQ 2/9  Decreased Interest 0 0 0  Down, Depressed, Hopeless 0 0 0  PHQ - 2 Score 0 0 0  Altered sleeping 0 0 0  Tired, decreased energy 0 0 0  Change in appetite 0 0 0  Feeling bad or failure about yourself  0 0 0  Trouble concentrating 0 0 0  Moving slowly or fidgety/restless 0 0 0  Suicidal thoughts 0 0 0  PHQ-9 Score 0 0 0  Difficult doing work/chores Not difficult at all Not difficult at all Not difficult at all    BP Readings from Last 3 Encounters:  05/19/23 120/70  05/12/23 (!) 195/79  05/09/23 118/78    Physical Exam HENT:     Right Ear: Tympanic membrane and ear canal normal.     Left Ear: Tympanic membrane and ear canal normal.  Cardiovascular:     Rate and Rhythm: Normal rate and regular rhythm.     Heart sounds: No murmur heard.    No gallop.  Pulmonary:     Breath sounds: No wheezing, rhonchi or rales.  Neurological:     Mental Status: She is alert.     Wt Readings from Last 3 Encounters:  05/19/23 165 lb (74.8 kg)  05/12/23 160 lb (72.6 kg)  05/09/23 165 lb (74.8 kg)    BP 120/70   Pulse (!) 55   Ht 5\' 3"  (1.6 m)   Wt 165 lb (74.8 kg)   SpO2 97%   BMI 29.23 kg/m   Assessment and Plan:  1. Type 2 diabetes mellitus without complication, without long-term current use of insulin (HCC) Chronic.  Controlled.  Stable.  Last A1c is in the mid 6 range..  Continue current dosing of metformin 500 mg twice a day and glipizide XL 2.5 daily in the morning.  I have discussed with patient that she needs to on an hourly basis transfer herself from her chair to a bedside toilet where she  needs to try to urinate on a regular basis to keep her bladder from overflow and urinary leakage which leads to further skin breakdown.  2. Essential hypertension Chronic.  Controlled.  Stable.  Blood pressure 120/70.  Continue metoprolol 25 mg twice a day and furosemide 20 mg daily.  3. Pressure injury of left buttock, stage 2 (HCC) New onset.  Due to patient's relative inactivity we have had a discussion in which patient has been challenged with that on an hourly basis that she sitting in a chair that she needs to discipline herself to move from chair to toilet and to try to relieve her bladder of any urine that she is capable of.  This will limit her from having urinary accidents and also will take pressure off of the ulcers on a regular basis.  Patient will be seen in wound care for care of the stage II pressure ulcer and will likely be given some assistance or suggestions for pillows or pads that can relieve some of the pressure off of the ischium.  Patient is somewhat reluctant to do this due to pain in her knees which have told her that she is just going to need to take her ibuprofen and Tylenol on a regular basis as well.  4. Urinary incontinence, unspecified type New onset.  Episodic.  Uncontrolled.  This is probably resolving with patient sitting in chair for extended periods of time and not noting that she needs to urinate and then when there is sensation she accidentally has incontinence which then contributes to her skin breakdown.  It has been recommended to patient that during the day when she is sitting that she needs to get up on an every 1-2 hour schedule to empty her bladder to reposition her body on cushion to reduce her risk of pressure ulcer formation and relieve her bladder so that she does not have overflow incontinence.   Elizabeth Sauer, MD

## 2023-05-20 ENCOUNTER — Encounter: Payer: Self-pay | Admitting: Family Medicine

## 2023-05-22 ENCOUNTER — Telehealth: Payer: Self-pay | Admitting: Family Medicine

## 2023-05-22 NOTE — Telephone Encounter (Signed)
Copied from CRM [id number]. Topic: Appointment Scheduling - Scheduling Inquiry for Clinic >> May 22, 2023  4:39 PM Marlow Baars wrote: Reason for CRM: Darnelle with [facility] called in to let the provider know they are pushing services out a day which will be on Wednesday now per request of the patient. Please assist patient further

## 2023-05-23 ENCOUNTER — Encounter: Payer: Medicare Other | Attending: Physician Assistant | Admitting: Physician Assistant

## 2023-05-23 ENCOUNTER — Telehealth: Payer: Self-pay

## 2023-05-23 DIAGNOSIS — I10 Essential (primary) hypertension: Secondary | ICD-10-CM | POA: Diagnosis not present

## 2023-05-23 DIAGNOSIS — I2510 Atherosclerotic heart disease of native coronary artery without angina pectoris: Secondary | ICD-10-CM | POA: Diagnosis not present

## 2023-05-23 DIAGNOSIS — L89312 Pressure ulcer of right buttock, stage 2: Secondary | ICD-10-CM | POA: Insufficient documentation

## 2023-05-23 DIAGNOSIS — M1990 Unspecified osteoarthritis, unspecified site: Secondary | ICD-10-CM | POA: Insufficient documentation

## 2023-05-23 DIAGNOSIS — L89323 Pressure ulcer of left buttock, stage 3: Secondary | ICD-10-CM | POA: Insufficient documentation

## 2023-05-23 DIAGNOSIS — E11622 Type 2 diabetes mellitus with other skin ulcer: Secondary | ICD-10-CM | POA: Insufficient documentation

## 2023-05-23 DIAGNOSIS — E1151 Type 2 diabetes mellitus with diabetic peripheral angiopathy without gangrene: Secondary | ICD-10-CM | POA: Insufficient documentation

## 2023-05-23 NOTE — Progress Notes (Signed)
APARNA, AISPURO ([id number]) [id number] Page 1 of 11 Visit Report for 05/23/2023 Allergy List Details Patient Name: Date of Service: [name], [name] Madison W. 05/23/2023 8:15 A M Medical Record Number: [id number] Patient Account Number: [id number] Date of Birth/Sex: Treating RN: 13-May-1932 (89 y.o. [name] Primary Care Tationa Stech: Elizabeth Sauer Other Clinician: Referring Janny Crute: Treating Macklen Wilhoite/Extender: Katrinka Blazing, Jennette Kettle Weeks in Treatment: 0 Allergies Active Allergies NSAIDS (Non-Steroidal Anti-Inflammatory Drug) lisinopril Allergy Notes Electronic Signature(s) Signed: 05/23/2023 4:14:56 PM By: Angelina Pih Entered By: Angelina Pih on 05/23/2023 08:38:07 -------------------------------------------------------------------------------- Arrival Information Details Patient Name: Date of Service: [name], [name] W. 05/23/2023 8:15 A M Medical Record Number: [id number] Patient Account Number: [id number] Date of Birth/Sex: Treating RN: [birth year] (89 y.o. [name] Primary Care Brianni Manthe: Elizabeth Sauer Other Clinician: Referring Jeanpaul Biehl: Treating Olga Bourbeau/Extender: Althia Forts Weeks in Treatment: 0 Visit Information Patient Arrived: Wheel Chair Arrival Time: 08:27 Accompanied By: daughter son Transfer Assistance: EasyPivot Patient Lift Patient Identification Verified: Yes Secondary Verification Process Completed: Yes Patient Has Alerts: Yes Patient Alerts: type 2 diabetic History Since Last Visit Added or deleted any medications: No Any new allergies or adverse reactions: No Had a fall or experienced change in activities of daily living that may affect risk of falls: Yes Hospitalized since last visit: No Has Dressing in Place as Prescribed: No Electronic Signature(s) Signed: 05/23/2023 9:46:48 AM By: Clarnce Flock [phone] By: Angelina Pih [phone].pdf Page 2 of  11 Signed: 05/23/2023 9:46:48 Entered By: Angelina Pih on 05/23/2023 09:46:48 -------------------------------------------------------------------------------- Clinic Level of Care Assessment Details Patient Name: Date of Service: Madison Ahmadi. 05/23/2023 8:15 A M Medical Record Number: [id number] Patient Account Number: [id number] Date of Birth/Sex: Treating RN: 1932/04/04 (89 y.o. [name] Primary Care Arland Usery: Elizabeth Sauer Other Clinician: Referring Dorma Altman: Treating Kalenna Millett/Extender: Althia Forts Weeks in Treatment: 0 Clinic Level of Care Assessment Items TOOL 2 Quantity Score []  - 0 Use when only an EandM is performed on the INITIAL visit ASSESSMENTS - Nursing Assessment / Reassessment X- 1 20 General Physical Exam (combine w/ comprehensive assessment (listed just below) when performed on new pt. evals) X- 1 25 Comprehensive Assessment (HX, ROS, Risk Assessments, Wounds Hx, etc.) ASSESSMENTS - Wound and Skin A ssessment / Reassessment []  - 0 Simple Wound Assessment / Reassessment - one wound X- 2 5 Complex Wound Assessment / Reassessment - multiple wounds []  - 0 Dermatologic / Skin Assessment (not related to wound area) ASSESSMENTS - Ostomy and/or Continence Assessment and Care []  - 0 Incontinence Assessment and Management []  - 0 Ostomy Care Assessment and Management (repouching, etc.) PROCESS - Coordination of Care X - Simple Patient / Family Education for ongoing care 1 15 []  - 0 Complex (extensive) Patient / Family Education for ongoing care X- 1 10 Staff obtains Chiropractor, Records, T Results / Process Orders est []  - 0 Staff telephones HHA, Nursing Homes / Clarify orders / etc []  - 0 Routine Transfer to another Facility (non-emergent condition) []  - 0 Routine Hospital Admission (non-emergent condition) X- 1 15 New Admissions / Manufacturing engineer / Ordering NPWT Apligraf, etc. , []  - 0 Emergency Hospital Admission  (emergent condition) X- 1 10 Simple Discharge Coordination []  - 0 Complex (extensive) Discharge Coordination PROCESS - Special Needs []  - 0 Pediatric / Minor Patient Management []  - 0 Isolation Patient Management []  - 0 Hearing / Language / Visual special needs []  - 0 Assessment of Community assistance (transportation, D/C planning,  etc.) []  - 0 Additional assistance / Altered mentation []  - 0 Support Surface(s) Assessment (bed, cushion, seat, etc.) AMILIA, RINGGOLD ([id number]) [id number] Page 3 of 11 INTERVENTIONS - Wound Cleansing / Measurement X- 1 5 Wound Imaging (photographs - any number of wounds) []  - 0 Wound Tracing (instead of photographs) []  - 0 Simple Wound Measurement - one wound X- 2 5 Complex Wound Measurement - multiple wounds []  - 0 Simple Wound Cleansing - one wound X- 2 5 Complex Wound Cleansing - multiple wounds INTERVENTIONS - Wound Dressings X - Small Wound Dressing one or multiple wounds 2 10 []  - 0 Medium Wound Dressing one or multiple wounds []  - 0 Large Wound Dressing one or multiple wounds []  - 0 Application of Medications - injection INTERVENTIONS - Miscellaneous []  - 0 External ear exam []  - 0 Specimen Collection (cultures, biopsies, blood, body fluids, etc.) []  - 0 Specimen(s) / Culture(s) sent or taken to Lab for analysis []  - 0 Patient Transfer (multiple staff / Nurse, adult / Similar devices) []  - 0 Simple Staple / Suture removal (25 or less) []  - 0 Complex Staple / Suture removal (26 or more) []  - 0 Hypo / Hyperglycemic Management (close monitor of Blood Glucose) []  - 0 Ankle / Brachial Index (ABI) - do not check if billed separately Has the patient been seen at the hospital within the last three years: Yes Total Score: 150 Level Of Care: New/Established - Level 4 Electronic Signature(s) Signed: 05/23/2023 4:14:56 PM By: Angelina Pih Entered By: Angelina Pih on 05/23/2023  09:25:23 -------------------------------------------------------------------------------- Encounter Discharge Information Details Patient Name: Date of Service: [name], [name] W. 05/23/2023 8:15 A M Medical Record Number: [id number] Patient Account Number: [id number] Date of Birth/Sex: Treating RN: 07-16-1932 (89 y.o. [name] Primary Care Kahlil Cowans: Elizabeth Sauer Other Clinician: Referring Morey Andonian: Treating Neya Creegan/Extender: Althia Forts Weeks in Treatment: 0 Encounter Discharge Information Items Discharge Condition: Stable Ambulatory Status: Wheelchair Discharge Destination: Home Transportation: Private Auto Accompanied By: daughter/son Schedule Follow-up Appointment: Yes Clinical Summary of Care: Madison, Reynolds ([id number]) [id number] Page 4 of 11 Electronic Signature(s) Signed: 05/23/2023 4:14:56 PM By: Angelina Pih Entered By: Angelina Pih on 05/23/2023 09:28:10 -------------------------------------------------------------------------------- Lower Extremity Assessment Details Patient Name: Date of Service: [name], [name] Madison W. 05/23/2023 8:15 A M Medical Record Number: [id number] Patient Account Number: [id number] Date of Birth/Sex: Treating RN: 1931/12/28 (89 y.o. [name] Primary Care Brittin Janik: Elizabeth Sauer Other Clinician: Referring Angelli Baruch: Treating Marteze Vecchio/Extender: Althia Forts Weeks in Treatment: 0 Electronic Signature(s) Signed: 05/23/2023 4:14:56 PM By: Angelina Pih Entered By: Angelina Pih on 05/23/2023 08:37:52 -------------------------------------------------------------------------------- Multi Wound Chart Details Patient Name: Date of Service: [name], [name] W. 05/23/2023 8:15 A M Medical Record Number: [id number] Patient Account Number: [id number] Date of Birth/Sex: Treating RN: 1932/04/21 (89 y.o. [name] Primary Care Jenefer Woerner: Elizabeth Sauer Other  Clinician: Referring Erika Slaby: Treating Teige Rountree/Extender: Althia Forts Weeks in Treatment: 0 Vital Signs Height(in): Pulse(bpm): 52 Weight(lbs): Blood Pressure(mmHg): 168/74 Body Mass Index(BMI): Temperature(F): 97.8 Respiratory Rate(breaths/min): 18 [3:Photos:] [N/A:N/A] Left Gluteus Right Gluteus N/A Wound Location: Pressure Injury Pressure Injury N/A Wounding Event: COTY, BAUKNECHT ([id number]) [id number] Page 5 of 11 Pressure Ulcer Pressure Ulcer N/A Primary Etiology: Arrhythmia, Coronary Artery Disease, Arrhythmia, Coronary Artery Disease, N/A Comorbid History: Hypertension, Type II Diabetes, Hypertension, Type II Diabetes, History of pressure wounds, History of pressure wounds, Osteoarthritis Osteoarthritis 04/22/2023 04/22/2023 N/A Date Acquired: 0 0 N/A Weeks of Treatment: Open Open N/A  Wound Status: No No N/A Wound Recurrence: 2.5x0.7x0.1 2.5x0.4x0.1 N/A Measurements L x W x D (cm) 1.374 0.785 N/A A (cm) : rea 0.137 0.079 N/A Volume (cm) : Category/Stage III Category/Stage II N/A Classification: Medium Medium N/A Exudate A mount: Serosanguineous Serosanguineous N/A Exudate Type: red, brown red, brown N/A Exudate Color: Large (67-100%) Large (67-100%) N/A Granulation A mount: Red, Pink Red, Pink N/A Granulation Quality: Small (1-33%) None Present (0%) N/A Necrotic A mount: Fat Layer (Subcutaneous Tissue): Yes N/A N/A Exposed Structures: None None N/A Epithelialization: Treatment Notes Electronic Signature(s) Signed: 05/23/2023 4:14:56 PM By: Angelina Pih Entered By: Angelina Pih on 05/23/2023 09:18:28 -------------------------------------------------------------------------------- Multi-Disciplinary Care Plan Details Patient Name: Date of Service: [name], [name] W. 05/23/2023 8:15 A M Medical Record Number: [id number] Patient Account Number: [id number] Date of Birth/Sex: Treating RN: 08-11-1932 (89  y.o. [name] Primary Care Brayla Pat: Elizabeth Sauer Other Clinician: Referring Candia Kingsbury: Treating Jazzlynn Rawe/Extender: Althia Forts Weeks in Treatment: 0 Active Inactive Orientation to the Wound Care Program Nursing Diagnoses: Knowledge deficit related to the wound healing center program Goals: Patient/caregiver will verbalize understanding of the Wound Healing Center Program Date Initiated: 05/23/2023 Target Resolution Date: 05/30/2023 Goal Status: Active Interventions: Provide education on orientation to the wound center Notes: Pressure Nursing Diagnoses: Knowledge deficit related to causes and risk factors for pressure ulcer development Knowledge deficit related to management of pressures ulcers Potential for impaired tissue integrity related to pressure, friction, moisture, and shear JALEIA, BERENGUER ([id number]) [id number] Page 6 of 11 Goals: Patient will remain free from development of additional pressure ulcers Date Initiated: 05/23/2023 Target Resolution Date: 07/18/2023 Goal Status: Active Patient will remain free of pressure ulcers Date Initiated: 05/23/2023 Target Resolution Date: 07/18/2023 Goal Status: Active Patient/caregiver will verbalize risk factors for pressure ulcer development Date Initiated: 05/23/2023 Target Resolution Date: 05/30/2023 Goal Status: Active Patient/caregiver will verbalize understanding of pressure ulcer management Date Initiated: 05/23/2023 Target Resolution Date: 05/30/2023 Goal Status: Active Interventions: Assess: immobility, friction, shearing, incontinence upon admission and as needed Assess offloading mechanisms upon admission and as needed Assess potential for pressure ulcer upon admission and as needed Provide education on pressure ulcers Notes: Wound/Skin Impairment Nursing Diagnoses: Impaired tissue integrity Knowledge deficit related to ulceration/compromised skin  integrity Goals: Ulcer/skin breakdown will have a volume reduction of 30% by week 4 Date Initiated: 05/23/2023 Target Resolution Date: 06/20/2023 Goal Status: Active Ulcer/skin breakdown will have a volume reduction of 50% by week 8 Date Initiated: 05/23/2023 Target Resolution Date: 07/18/2023 Goal Status: Active Ulcer/skin breakdown will have a volume reduction of 80% by week 12 Date Initiated: 05/23/2023 Target Resolution Date: 08/15/2023 Goal Status: Active Ulcer/skin breakdown will heal within 14 weeks Date Initiated: 05/23/2023 Target Resolution Date: 08/29/2023 Goal Status: Active Interventions: Assess patient/caregiver ability to obtain necessary supplies Assess patient/caregiver ability to perform ulcer/skin care regimen upon admission and as needed Assess ulceration(s) every visit Provide education on ulcer and skin care Treatment Activities: Topical wound management initiated : 05/23/2023 Notes: Electronic Signature(s) Signed: 05/23/2023 4:14:56 PM By: Angelina Pih Entered By: Angelina Pih on 05/23/2023 09:27:06 -------------------------------------------------------------------------------- Pain Assessment Details Patient Name: Date of Service: [name], [name] W. 05/23/2023 8:15 A M Medical Record Number: [id number] Patient Account Number: [id number] Date of Birth/Sex: Treating RN: [birth year] (89 y.o. [name] [place], [place] ([id number]) [phone].pdf Page 7 of 11 Primary Care Jaylun Fleener: Elizabeth Sauer Other Clinician: Referring Leonell Lobdell: Treating Isabel Freese/Extender: Althia Forts Weeks in Treatment: 0 Active Problems Location of Pain Severity and Description  of Pain Patient Has Paino Yes Site Locations Rate the pain. Current Pain Level: 6 Pain Management and Medication Current Pain Management: Electronic Signature(s) Signed: 05/23/2023 4:14:56 PM By: Angelina Pih Entered By: Angelina Pih on 05/23/2023  08:37:07 -------------------------------------------------------------------------------- Patient/Caregiver Education Details Patient Name: Date of Service: [name], [name] W. 7/2/2024andnbsp8:15 A M Medical Record Number: [id number] Patient Account Number: [id number] Date of Birth/Gender: Treating RN: 17-Mar-1932 (89 y.o. [name] Primary Care Physician: Elizabeth Sauer Other Clinician: Referring Physician: Treating Physician/Extender: Althia Forts Weeks in Treatment: 0 Education Assessment Education Provided To: Patient and Caregiver daughter/son Education Topics Provided Pressure: Handouts: Pressure Injury: Prevention and Offloading Methods: Explain/Verbal Responses: State content correctly Wound/Skin Impairment: Handouts: Caring for Your Ulcer Methods: Explain/Verbal Responses: State content correctly [name] ([id number]) [id number] Page 8 of 11 Electronic Signature(s) Signed: 05/23/2023 4:14:56 PM By: Angelina Pih Entered By: Angelina Pih on 05/23/2023 08:65:78 -------------------------------------------------------------------------------- Wound Assessment Details Patient Name: Date of Service: [name], [name] W. 05/23/2023 8:15 A M Medical Record Number: [id number] Patient Account Number: [id number] Date of Birth/Sex: Treating RN: Sep 03, 1932 (89 y.o. [name] Primary Care Cressie Betzler: Elizabeth Sauer Other Clinician: Referring Mylah Baynes: Treating Baran Kuhrt/Extender: Althia Forts Weeks in Treatment: 0 Wound Status Wound Number: 3 Primary Pressure Ulcer Etiology: Wound Location: Left Gluteus Wound Open Wounding Event: Pressure Injury Status: Date Acquired: 04/22/2023 Comorbid Arrhythmia, Coronary Artery Disease, Hypertension, Type II Weeks Of Treatment: 0 History: Diabetes, History of pressure wounds, Osteoarthritis Clustered Wound: No Photos Wound Measurements Length: (cm) 2.5 Width:  (cm) 0.7 Depth: (cm) 0.1 Area: (cm) 1.374 Volume: (cm) 0.137 % Reduction in Area: % Reduction in Volume: Epithelialization: None Tunneling: No Undermining: No Wound Description Classification: Category/Stage III Exudate Amount: Medium Exudate Type: Serosanguineous Exudate Color: red, brown Foul Odor After Cleansing: No Slough/Fibrino Yes Wound Bed Granulation Amount: Large (67-100%) Exposed Structure Granulation Quality: Red, Pink Fat Layer (Subcutaneous Tissue) Exposed: Yes Necrotic Amount: Small (1-33%) Necrotic Quality: Adherent Slough Treatment Notes Wound #3 (Gluteus) Wound Laterality: Left RHENA, OLIVERSON ([id number]) [id number] Page 9 of 11 Cleanser Soap and Water Discharge Instruction: Gently cleanse wound with antibacterial soap, rinse and pat dry prior to dressing wounds Peri-Wound Care Topical calmoseptine Discharge Instruction: apply daily or as needed Primary Dressing Secondary Dressing Secured With Compression Wrap Compression Stockings Add-Ons Electronic Signature(s) Signed: 05/23/2023 4:14:56 PM By: Angelina Pih Entered By: Angelina Pih on 05/23/2023 08:56:48 -------------------------------------------------------------------------------- Wound Assessment Details Patient Name: Date of Service: [name], [name] W. 05/23/2023 8:15 A M Medical Record Number: [id number] Patient Account Number: [id number] Date of Birth/Sex: Treating RN: Jun 28, 1932 (89 y.o. [name] Primary Care Elayna Tobler: Elizabeth Sauer Other Clinician: Referring Siboney Requejo: Treating Sophiamarie Nease/Extender: Althia Forts Weeks in Treatment: 0 Wound Status Wound Number: 4 Primary Pressure Ulcer Etiology: Wound Location: Right Gluteus Wound Open Wounding Event: Pressure Injury Status: Date Acquired: 04/22/2023 Comorbid Arrhythmia, Coronary Artery Disease, Hypertension, Type II Weeks Of Treatment: 0 History: Diabetes, History of pressure  wounds, Osteoarthritis Clustered Wound: No Photos Wound Measurements Length: (cm) 2.5 Width: (cm) 0.4 Depth: (cm) 0.1 Area: (cm) 0.785 Volume: (cm) 0.079 Wilmer, Johnnae W ([id number]) % Reduction in Area: % Reduction in Volume: Epithelialization: None Tunneling: No Undermining: No [id number] Page 10 of 11 Wound Description Classification: Category/Stage II Exudate Amount: Medium Exudate Type: Serosanguineous Exudate Color: red, brown Foul Odor After Cleansing: No Slough/Fibrino Yes Wound Bed Granulation Amount: Large (67-100%) Granulation Quality: Red, Pink Necrotic Amount: None Present (0%) Treatment Notes Wound #4 (Gluteus) Wound  Laterality: Right Cleanser Soap and Water Discharge Instruction: Gently cleanse wound with antibacterial soap, rinse and pat dry prior to dressing wounds Peri-Wound Care Topical calmoseptine Discharge Instruction: apply daily or as needed Primary Dressing Secondary Dressing Secured With Compression Wrap Compression Stockings Add-Ons Electronic Signature(s) Signed: 05/23/2023 4:14:56 PM By: Angelina Pih Entered By: Angelina Pih on 05/23/2023 08:57:45 -------------------------------------------------------------------------------- Vitals Details Patient Name: Date of Service: Madison LBERT, [name] W. 05/23/2023 8:15 A M Medical Record Number: [id number] Patient Account Number: [id number] Date of Birth/Sex: Treating RN: May 07, 1932 (89 y.o. [name] Primary Care Starlyn Droge: Elizabeth Sauer Other Clinician: Referring Iceis Knab: Treating Perian Tedder/Extender: Althia Forts Weeks in Treatment: 0 Vital Signs Time Taken: 08:35 Temperature (F): 97.8 Height (in): 63 Pulse (bpm): 52 Source: Stated Respiratory Rate (breaths/min): 18 Weight (lbs): 163 Blood Pressure (mmHg): 168/74 Source: Stated Reference Range: 80 - 120 mg / dl Body Mass Index (BMI): 28.9 Electronic Signature(s) CORETTA, TRAMA  ([id number]) [id number] Page 11 of 11 Signed: 05/23/2023 9:47:06 AM By: Angelina Pih Entered By: Angelina Pih on 05/23/2023 09:47:06

## 2023-05-23 NOTE — Progress Notes (Signed)
[name] ([id number]) [phone] [email or web address] Page 1 of 5 Visit Report for 05/23/2023 Abuse Risk Screen Details Patient Name: Date of Service: [name], [place] Madison W. 05/23/2023 8:15 A M Medical Record Number: [id number] Patient Account Number: [id number] Date of Birth/Sex: Treating RN: 1931-11-22 (89 y.o. [name] Primary Care [name]: [name] Other Clinician: Referring [name]: Treating [name]/Extender: [name] Weeks in Treatment: 0 Abuse Risk Screen Items Answer ABUSE RISK SCREEN: Has anyone close to you tried to hurt or harm you recentlyo No Do you feel uncomfortable with anyone in your familyo No Has anyone forced you do things that you didnt want to doo No Electronic Signature(s) Signed: 05/23/2023 4:14:56 PM By: [name] Entered By: [name] on 05/23/2023 08:41:25 -------------------------------------------------------------------------------- Activities of Daily Living Details Patient Name: Date of Service: [name]. 05/23/2023 8:15 A M Medical Record Number: [id number] Patient Account Number: [id number] Date of Birth/Sex: Treating RN: 11-07-1932 (89 y.o. [name] Primary Care [name]: [name] Other Clinician: Referring [name]: Treating [name]/Extender: [name] Weeks in Treatment: 0 Activities of Daily Living Items Answer Activities of Daily Living (Please select one for each item) Drive Automobile Not Able T Medications ake Completely Able Use T elephone Completely Able Care for Appearance Need Assistance Use T oilet Need Assistance Bath / Shower Need Assistance Dress Self Need Assistance Feed Self Completely Able Walk Not Able Get In / Out Bed Need Assistance Housework Need Assistance [name], [name] ([id number]) [phone] [email or web address] Page 2 of 5 Prepare Meals Need Assistance Handle Money Need Assistance Shop for Self  Need Assistance Electronic Signature(s) Signed: 05/23/2023 4:14:56 PM By: [name] Entered By: [name] on 05/23/2023 08:41:55 -------------------------------------------------------------------------------- Education Screening Details Patient Name: Date of Service: [name], [name] W. 05/23/2023 8:15 A M Medical Record Number: [id number] Patient Account Number: [id number] Date of Birth/Sex: Treating RN: 12/08/1931 (89 y.o. [name] Primary Care [name]: [name] Other Clinician: Referring [name]: Treating [name]/Extender: [name] Weeks in Treatment: 0 Learning Preferences/Education Level/Primary Language Learning Preference: Explanation, Demonstration, Video, Communication Board, Printed Material Preferred Language: English Cognitive Barrier Language Barrier: No Translator Needed: No Memory Deficit: No Emotional Barrier: No Cultural/Religious Beliefs Affecting Medical Care: No Physical Barrier Impaired Vision: No Impaired Hearing: No Decreased Hand dexterity: No Knowledge/Comprehension Knowledge Level: High Comprehension Level: High Ability to understand written instructions: High Ability to understand verbal instructions: High Motivation Anxiety Level: Calm Cooperation: Cooperative Education Importance: Acknowledges Need Interest in Health Problems: Asks Questions Perception: Coherent Willingness to Engage in Self-Management High Activities: Readiness to Engage in Self-Management High Activities: Electronic Signature(s) Signed: 05/23/2023 4:14:56 PM By: [name] Entered By: [name] on 05/23/2023 08:42:13 [name] ([id number]) [id number] Page 3 of 5 -------------------------------------------------------------------------------- Fall Risk Assessment Details Patient Name: Date of Service: [name], [place] Madison W. 05/23/2023 8:15 A M Medical Record Number:  [id number] Patient Account Number: [id number] Date of Birth/Sex: Treating RN: [birth year] (89 y.o. [name] Primary Care Shandell Jallow: [name] Other Clinician: Referring Kimberlin Scheel: Treating Kayline Sheer/Extender: [name] Weeks in Treatment: 0 Fall Risk Assessment Items Have you had 2 or more falls in the last 12 monthso 0 Yes Have you had any fall that resulted in injury in the last 12 monthso 0 No FALLS RISK SCREEN History of falling - immediate or within 3 months 25 Yes Secondary diagnosis (Do you have 2 or more medical diagnoseso) 0 No Ambulatory aid None/bed rest/wheelchair/nurse 0 Yes Crutches/cane/walker  0 No Furniture 0 No Intravenous therapy Access/Saline/Heparin Lock 0 No Gait/Transferring Normal/ bed rest/ wheelchair 0 Yes Weak (short steps with or without shuffle, stooped but able to lift head while walking, may seek 0 No support from furniture) Impaired (short steps with shuffle, may have difficulty arising from chair, head down, impaired 0 No balance) Mental Status Oriented to own ability 0 Yes Electronic Signature(s) Signed: 05/23/2023 4:14:56 PM By: [name] Entered By: [name] on 05/23/2023 08:42:33 -------------------------------------------------------------------------------- Foot Assessment Details Patient Name: Date of Service: [name], [name] W. 05/23/2023 8:15 A M Medical Record Number: [id number] Patient Account Number: [id number] Date of Birth/Sex: Treating RN: [birth year] (89 y.o. [name] Primary Care Graceanne Guin: [name] Other Clinician: Referring Fateh Kindle: Treating Binyamin Nelis/Extender: [name] Weeks in Treatment: 0 Foot Assessment Items Site Locations [place] ([id number]) [phone] [email or web address] Page 4 of 5 + = Sensation present, - = Sensation absent, C = Callus, U = Ulcer R = Redness, W = Warmth, M = Maceration, PU = Pre-ulcerative lesion F = Fissure,  S = Swelling, D = Dryness Assessment Right: Left: Other Deformity: No No Prior Foot Ulcer: No No Prior Amputation: No No Charcot Joint: No No Ambulatory Status: Non-ambulatory Assistance Device: Wheelchair Gait: Unsteady Notes no wounds to feet or legs, only on bottom Electronic Signature(s) Signed: 05/23/2023 4:14:56 PM By: [name] Entered By: [name] on 05/23/2023 08:43:19 -------------------------------------------------------------------------------- Nutrition Risk Screening Details Patient Name: Date of Service: [name], [name] W. 05/23/2023 8:15 A M Medical Record Number: [id number] Patient Account Number: [id number] Date of Birth/Sex: Treating RN: 03-02-1932 (89 y.o. [name] Primary Care Eljay Lave: [name] Other Clinician: Referring Josealberto Montalto: Treating Dugan Vanhoesen/Extender: [name] Weeks in Treatment: 0 Height (in): Weight (lbs): Body Mass Index (BMI): Nutrition Risk Screening Items Score Screening NUTRITION RISK SCREEN: I have an illness or condition that made me change the kind and/or amount of food I eat 0 No [name], [name] ([id number]) [phone] [email or web address] Page 5 of 5 I eat fewer than two meals per day 0 No I eat few fruits and vegetables, or milk products 0 No I have three or more drinks of beer, liquor or wine almost every day 0 No I have tooth or mouth problems that make it hard for me to eat 0 No I don't always have enough money to buy the food I need 0 No I eat alone most of the time 0 No I take three or more different prescribed or over-the-counter drugs a day 0 No Without wanting to, I have lost or gained 10 pounds in the last six months 0 No I am not always physically able to shop, cook and/or feed myself 0 No Nutrition Protocols Good Risk Protocol 0 No interventions needed Moderate Risk Protocol High Risk Proctocol Risk Level: Good Risk Score: 0 Electronic Signature(s) Signed:  05/23/2023 4:14:56 PM By: [name] Entered By: [name] on 05/23/2023 08:42:54

## 2023-05-23 NOTE — Telephone Encounter (Signed)
Called [name] and told him that they HAVE to keep the home health appt

## 2023-05-23 NOTE — Progress Notes (Signed)
[name], [name] ([id number]) [id number] Page 1 of 10 Visit Report for 05/23/2023 Chief Complaint Document Details Patient Name: Date of Service: [name], Madison RIE Lacretia Nicks. 05/23/2023 8:15 A M Medical Record Number: [id number] Patient Account Number: [id number] Date of Birth/Sex: Treating RN: 1932/07/14 (89 y.o. [name] Primary Care Provider: Elizabeth Sauer Other Clinician: Referring Provider: Treating Provider/Extender: Althia Forts Weeks in Treatment: 0 Information Obtained from: Patient Chief Complaint Bilateral buttock ulcers Electronic Signature(s) Signed: 05/23/2023 9:12:08 AM By: Allen Derry PA-C Entered By: Allen Derry on 05/23/2023 09:12:08 -------------------------------------------------------------------------------- HPI Details Patient Name: Date of Service: [name], Madison [name] 05/23/2023 8:15 A M Medical Record Number: [id number] Patient Account Number: [id number] Date of Birth/Sex: Treating RN: Nov 12, 1932 (89 y.o. [name] Primary Care Provider: Elizabeth Sauer Other Clinician: Referring Provider: Treating Provider/Extender: Althia Forts Weeks in Treatment: 0 History of Present Illness HPI Description: 03-04-2022 upon evaluation today patient appears to be doing poorly in regard to her gluteal region. Its mainly in the left gluteal region where there is an open ulceration at this point. This does appear to be a stage III pressure ulcer. Fortunately there does not appear to be any evidence of active infection at this site locally nor systemically which is also news. The patient has been counseled by her daughter who is present with her today her son was also on the phone we had a good discussion at this point. Nonetheless they have been trying to get her to not sit as long as they knew that was the major issue the patient tells me however she has a hard time getting up because of her knee. She has had of extensive  work-up and treatment for the knee and fortunately has been determined this is just arthritis and there is really not much more that they can do. Nonetheless I think she is going to have to get up and move around or at least get up and stand and allow for blood flow into the gluteal area or out she is can end up with more significant wounds than what we have and see right now. Patient does have a history of diabetes mellitus type 2, hypertension, and coronary artery disease. This wound has been present for at this point roughly 1 month beginning on or around February 04, 2022. 03-21-2022 upon evaluation today patient's wound actually showing signs of excellent improvement I am very pleased with where we stand today there does not appear to be any signs of infection which is great news and overall I think we are headed in the right direction. This is not terribly smaller but it is also looking better than what it was as far as the health of the surface of the wound is concerned. 04-15-2022 upon evaluation today patient appears to be doing well in regard to 1 side of her gluteal area not as well and the other that was healed last time. The [facility] ([id number]) [id number] Page 2 of 10 right side is reopened which was previously closed. The left side is not as bad and in fact is very close to complete closure. Fortunately I do not see any evidence of active infection locally or systemically at this time which is great news. Overall I think that we are headed in the right direction but still regular probably need to put something on both sides to protect at this point and try to get these areas healed. 04-29-2022 upon evaluation today patient appears to  be doing well with regard to her wounds both are shown signs of improvement and very pleased in that regard. I do not see evidence of infection locally or systemically currently. 05-13-2022 upon evaluation today patient's wounds  are showing signs of some improvement though she does have some need for sharp debridement today as well. I did have a pretty lengthy discussion with her about what causes these wounds and how to prevent them. She voiced understanding I know she has a hard time standing up and moving around but even if she can just stand at the side of her bed this would be something. She voiced understanding. 05-27-2022 upon evaluation today patient appears to be doing okay in regard to her wound. Fortunately there does not appear to be any signs of active infection locally or systemically at this time which is great news. No fevers, chills, nausea, vomiting, or diarrhea. With that being said both wounds are still open but unfortunately she is having a lot of issues with discomfort and the dressing just are not staying in place. Fortunately I do not see again in the significant drainage which is good news. 06-10-2022 upon evaluation today patient appears to be doing better in regard to her wounds. They have been using the Desitin and they do feel like this is doing quite well. Fortunately I do not see any evidence of active infection locally or systemically at this time. No fevers, chills, nausea, vomiting, or diarrhea. 07-08-2022 upon evaluation today patient appears to be doing well currently in regard to her wound in the gluteal region. In fact this appears to be completely healed based on what I am seeing in. Fortunately I do not see any signs of active infection at this time which is great news. Readmission: 05-23-2023 upon evaluation today patient appears to be doing poorly currently in regard to her gluteal region. This is a patient whom I am familiar with I did see her last year from roughly April through what we healed her in August of that time. With that being said she has similar issues right now going on where she does have breakdown in the bilateral gluteal regions. She does upon further questioning sleep in a  recliner which is not optimal and not something that we discussed previous we discussed that again today. She also spends majority of her day sitting up in either wheelchair or in the recliner. Subsequently I think that this is a major culprit for why this is breaking down. Patient's past medical history really has not changed significantly she continues to have diabetes, hypertension, and history of coronary artery disease. Electronic Signature(s) Signed: 05/23/2023 10:19:26 AM By: Allen Derry PA-C Entered By: Allen Derry on 05/23/2023 10:19:26 -------------------------------------------------------------------------------- Physical Exam Details Patient Name: Date of Service: [name], Madison [name] 05/23/2023 8:15 A M Medical Record Number: [id number] Patient Account Number: [id number] Date of Birth/Sex: Treating RN: 07/16/1932 (89 y.o. [name] Primary Care Provider: Elizabeth Sauer Other Clinician: Referring Provider: Treating Provider/Extender: Althia Forts Weeks in Treatment: 0 Constitutional patient is hypertensive.. pulse regular and within target range for patient.Marland Kitchen respirations regular, non-labored and within target range for patient.Marland Kitchen temperature within target range for patient.. Obese and well-hydrated in no acute distress. Eyes conjunctiva clear no eyelid edema noted. pupils equal round and reactive to light and accommodation. Ears, Nose, Mouth, and Throat no gross abnormality of ear auricles or external auditory canals. normal hearing noted during conversation. mucus membranes moist. Respiratory normal breathing without difficulty.  Musculoskeletal normal gait and posture. no significant deformity or arthritic changes, no loss or range of motion, no clubbing. Psychiatric this patient is able to make decisions and demonstrates good insight into disease process. Alert and Oriented x 3. pleasant and cooperative. Notes Upon inspection patient has wounds noted to  the bilateral gluteal region. Unfortunately she does have some signs of being a little bit worse on the left side CHENIKA, MORGENSTERN ([id number]) [id number] Page 3 of 10 compared to the right I would say the right is a stage II the left is a stage III pressure ulcer. With that being said I do not see any major signs of infection at this point which is good news. In fact I think she is infection free at the moment. Nonetheless that is something that we want to make sure does not worsen I discussed with the patient this is good to me that she is to be very diligent about offloading and keeping pressure from causing a significant issue here. Electronic Signature(s) Signed: 05/23/2023 10:20:08 AM By: Allen Derry PA-C Entered By: Allen Derry on 05/23/2023 10:20:08 -------------------------------------------------------------------------------- Physician Orders Details Patient Name: Date of Service: [name], Madison [name] 05/23/2023 8:15 A M Medical Record Number: [id number] Patient Account Number: [id number] Date of Birth/Sex: Treating RN: 1932-08-14 (89 y.o. [name] Primary Care Provider: Elizabeth Sauer Other Clinician: Referring Provider: Treating Provider/Extender: Althia Forts Weeks in Treatment: 0 Verbal / Phone Orders: No Diagnosis Coding ICD-10 Coding Code Description [phone] Pressure ulcer of left buttock, stage 3 L89.312 Pressure ulcer of right buttock, stage 2 E11.622 Type 2 diabetes mellitus with other skin ulcer I10 Essential (primary) hypertension I25.10 Atherosclerotic heart disease of native coronary artery without angina pectoris Follow-up Appointments Return Appointment in 1 week. Bathing/ [employer] wounds with antibacterial soap and water. May shower; gently cleanse wound with antibacterial soap, rinse and pat dry prior to dressing wounds No tub bath. Anesthetic (Use 'Patient Medications' Section for Anesthetic Order  Entry) Lidocaine applied to wound bed Off-Loading Turn and reposition every 2 hours Other: - You can not be sitting in chair/wheelchair for long periods of time, you need to spend time in the bed laying on either side to keep pressure off your bottom. You may be up in chair for breakfast, lunch and dinner for max an hour and a half, but then you should be in bed laying on either side changing position every two hours. Wound Treatment Wound #3 - Gluteus Wound Laterality: Left Cleanser: Soap and Water 1 x Per Day/30 Days Discharge Instructions: Gently cleanse wound with antibacterial soap, rinse and pat dry prior to dressing wounds Topical: calmoseptine 1 x Per Day/30 Days Discharge Instructions: apply daily or as needed Wound #4 - Gluteus Wound Laterality: Right Cleanser: Soap and Water 1 x Per Day/30 Days Discharge Instructions: Gently cleanse wound with antibacterial soap, rinse and pat dry prior to dressing wounds Topical: calmoseptine 1 x Per Day/30 Days Discharge Instructions: apply daily or as needed [name], [name] ([id number]) [id number] Page 4 of 10 Electronic Signature(s) Signed: 05/23/2023 4:14:56 PM By: Angelina Pih Signed: 05/23/2023 5:13:07 PM By: Allen Derry PA-C Entered By: Angelina Pih on 05/23/2023 09:24:38 -------------------------------------------------------------------------------- Problem List Details Patient Name: Date of Service: [name], Madison [name] 05/23/2023 8:15 A M Medical Record Number: [id number] Patient Account Number: [id number] Date of Birth/Sex: Treating RN: 1932-05-31 (89 y.o. [name] Primary Care Provider: Elizabeth Sauer Other Clinician: Referring Provider: Treating Provider/Extender: Althia Forts  Weeks in Treatment: 0 Active Problems ICD-10 Encounter Code Description Active Date MDM Diagnosis L89.323 Pressure ulcer of left buttock, stage 3 05/23/2023 No Yes L89.312 Pressure ulcer of right  buttock, stage 2 05/23/2023 No Yes E11.622 Type 2 diabetes mellitus with other skin ulcer 05/23/2023 No Yes I10 Essential (primary) hypertension 05/23/2023 No Yes I25.10 Atherosclerotic heart disease of native coronary artery without angina pectoris 05/23/2023 No Yes Inactive Problems Resolved Problems Electronic Signature(s) Signed: 05/23/2023 9:12:02 AM By: Allen Derry PA-C Entered By: Allen Derry on 05/23/2023 09:12:02 [name] ([id number]) [id number] Page 5 of 10 -------------------------------------------------------------------------------- Progress Note Details Patient Name: Date of Service: [name], Madison [name] 05/23/2023 8:15 A M Medical Record Number: [id number] Patient Account Number: [id number] Date of Birth/Sex: Treating RN: 10/09/1932 (89 y.o. [name] Primary Care Provider: Elizabeth Sauer Other Clinician: Referring Provider: Treating Provider/Extender: Althia Forts Weeks in Treatment: 0 Subjective Chief Complaint Information obtained from Patient Bilateral buttock ulcers History of Present Illness (HPI) 03-04-2022 upon evaluation today patient appears to be doing poorly in regard to her gluteal region. Its mainly in the left gluteal region where there is an open ulceration at this point. This does appear to be a stage III pressure ulcer. Fortunately there does not appear to be any evidence of active infection at this site locally nor systemically which is also news. The patient has been counseled by her daughter who is present with her today her son was also on the phone we had a good discussion at this point. Nonetheless they have been trying to get her to not sit as long as they knew that was the major issue the patient tells me however she has a hard time getting up because of her knee. She has had of extensive work-up and treatment for the knee and fortunately has been determined this is just arthritis and there is really  not much more that they can do. Nonetheless I think she is going to have to get up and move around or at least get up and stand and allow for blood flow into the gluteal area or out she is can end up with more significant wounds than what we have and see right now. Patient does have a history of diabetes mellitus type 2, hypertension, and coronary artery disease. This wound has been present for at this point roughly 1 month beginning on or around February 04, 2022. 03-21-2022 upon evaluation today patient's wound actually showing signs of excellent improvement I am very pleased with where we stand today there does not appear to be any signs of infection which is great news and overall I think we are headed in the right direction. This is not terribly smaller but it is also looking better than what it was as far as the health of the surface of the wound is concerned. 04-15-2022 upon evaluation today patient appears to be doing well in regard to 1 side of her gluteal area not as well and the other that was healed last time. The right side is reopened which was previously closed. The left side is not as bad and in fact is very close to complete closure. Fortunately I do not see any evidence of active infection locally or systemically at this time which is great news. Overall I think that we are headed in the right direction but still regular probably need to put something on both sides to protect at this point and try to get these areas healed. 04-29-2022  upon evaluation today patient appears to be doing well with regard to her wounds both are shown signs of improvement and very pleased in that regard. I do not see evidence of infection locally or systemically currently. 05-13-2022 upon evaluation today patient's wounds are showing signs of some improvement though she does have some need for sharp debridement today as well. I did have a pretty lengthy discussion with her about what causes these wounds and how to  prevent them. She voiced understanding I know she has a hard time standing up and moving around but even if she can just stand at the side of her bed this would be something. She voiced understanding. 05-27-2022 upon evaluation today patient appears to be doing okay in regard to her wound. Fortunately there does not appear to be any signs of active infection locally or systemically at this time which is great news. No fevers, chills, nausea, vomiting, or diarrhea. With that being said both wounds are still open but unfortunately she is having a lot of issues with discomfort and the dressing just are not staying in place. Fortunately I do not see again in the significant drainage which is good news. 06-10-2022 upon evaluation today patient appears to be doing better in regard to her wounds. They have been using the Desitin and they do feel like this is doing quite well. Fortunately I do not see any evidence of active infection locally or systemically at this time. No fevers, chills, nausea, vomiting, or diarrhea. 07-08-2022 upon evaluation today patient appears to be doing well currently in regard to her wound in the gluteal region. In fact this appears to be completely healed based on what I am seeing in. Fortunately I do not see any signs of active infection at this time which is great news. Readmission: 05-23-2023 upon evaluation today patient appears to be doing poorly currently in regard to her gluteal region. This is a patient whom I am familiar with I did see her last year from roughly April through what we healed her in August of that time. With that being said she has similar issues right now going on where she does have breakdown in the bilateral gluteal regions. She does upon further questioning sleep in a recliner which is not optimal and not something that we discussed previous we discussed that again today. She also spends majority of her day sitting up in either wheelchair or in the recliner.  Subsequently I think that this is a major culprit for why this is breaking down. Patient's past medical history really has not changed significantly she continues to have diabetes, hypertension, and history of coronary artery disease. Patient History Information obtained from Patient, Caregiver, Chart. Allergies NSAIDS (Non-Steroidal Anti-Inflammatory Drug), lisinopril Social History Never smoker, Marital Status - Widowed, Alcohol Use - Never, Drug Use - No History, Caffeine Use - Never. [name], [name] ([id number]) [id number] Page 6 of 10 Medical History Cardiovascular Patient has history of Arrhythmia, Coronary Artery Disease, Hypertension Denies history of Congestive Heart Failure, Myocardial Infarction Endocrine Patient has history of Type II Diabetes Integumentary (Skin) Patient has history of History of pressure wounds Musculoskeletal Patient has history of Osteoarthritis Patient is treated with Oral Agents. Blood sugar is not tested. Review of Systems (ROS) Constitutional Symptoms (General Health) Denies complaints or symptoms of Fatigue, Fever, Chills, Marked Weight Change. Eyes Denies complaints or symptoms of Dry Eyes, Vision Changes, Glasses / Contacts. Ear/Nose/Mouth/Throat Denies complaints or symptoms of Difficult clearing ears, Sinusitis. Hematologic/Lymphatic Denies complaints or  symptoms of Bleeding / Clotting Disorders, Human Immunodeficiency Virus. Respiratory Denies complaints or symptoms of Chronic or frequent coughs, Shortness of Breath. Gastrointestinal Denies complaints or symptoms of Frequent diarrhea, Nausea, Vomiting. Genitourinary Denies complaints or symptoms of Kidney failure/ Dialysis, Incontinence/dribbling. Immunological Denies complaints or symptoms of Hives, Itching. Integumentary (Skin) Complains or has symptoms of Wounds. Musculoskeletal Complains or has symptoms of Muscle Pain, Muscle  Weakness. Neurologic Denies complaints or symptoms of Numbness/parasthesias, Focal/Weakness. Psychiatric Denies complaints or symptoms of Anxiety, Claustrophobia. Objective Constitutional patient is hypertensive.. pulse regular and within target range for patient.Marland Kitchen respirations regular, non-labored and within target range for patient.Marland Kitchen temperature within target range for patient.. Obese and well-hydrated in no acute distress. Vitals Time Taken: 8:35 AM, Height: 63 in, Source: Stated, Weight: 163 lbs, Source: Stated, BMI: 28.9, Temperature: 97.8 F, Pulse: 52 bpm, Respiratory Rate: 18 breaths/min, Blood Pressure: 168/74 mmHg. Eyes conjunctiva clear no eyelid edema noted. pupils equal round and reactive to light and accommodation. Ears, Nose, Mouth, and Throat no gross abnormality of ear auricles or external auditory canals. normal hearing noted during conversation. mucus membranes moist. Respiratory normal breathing without difficulty. Musculoskeletal normal gait and posture. no significant deformity or arthritic changes, no loss or range of motion, no clubbing. Psychiatric this patient is able to make decisions and demonstrates good insight into disease process. Alert and Oriented x 3. pleasant and cooperative. General Notes: Upon inspection patient has wounds noted to the bilateral gluteal region. Unfortunately she does have some signs of being a little bit worse on the left side compared to the right I would say the right is a stage II the left is a stage III pressure ulcer. With that being said I do not see any major signs of infection at this point which is good news. In fact I think she is infection free at the moment. Nonetheless that is something that we want to make sure does not worsen I discussed with the patient this is good to me that she is to be very diligent about offloading and keeping pressure from causing a significant issue here. Integumentary (Hair, Skin) Wound #3 status  is Open. Original cause of wound was Pressure Injury. The date acquired was: 04/22/2023. The wound is located on the Left Gluteus. The wound measures 2.5cm length x 0.7cm width x 0.1cm depth; 1.374cm^2 area and 0.137cm^3 volume. There is Fat Layer (Subcutaneous Tissue) exposed. There is no tunneling or undermining noted. There is a medium amount of serosanguineous drainage noted. There is large (67-100%) red, pink granulation within the wound bed. There is a small (1-33%) amount of necrotic tissue within the wound bed including Adherent Slough. Wound #4 status is Open. Original cause of wound was Pressure Injury. The date acquired was: 04/22/2023. The wound is located on the Right Gluteus. The wound measures 2.5cm length x 0.4cm width x 0.1cm depth; 0.785cm^2 area and 0.079cm^3 volume. There is no tunneling or undermining noted. There is a medium amount of serosanguineous drainage noted. There is large (67-100%) red, pink granulation within the wound bed. There is no necrotic tissue within the wound bed. [name], [name] ([id number]) [id number] Page 7 of 10 Assessment Active Problems ICD-10 Pressure ulcer of left buttock, stage 3 Pressure ulcer of right buttock, stage 2 Type 2 diabetes mellitus with other skin ulcer Essential (primary) hypertension Atherosclerotic heart disease of native coronary artery without angina pectoris Plan Follow-up Appointments: Return Appointment in 1 week. Bathing/ Shower/ Hygiene: Wash wounds with antibacterial soap and water. May shower; gently  cleanse wound with antibacterial soap, rinse and pat dry prior to dressing wounds No tub bath. Anesthetic (Use 'Patient Medications' Section for Anesthetic Order Entry): Lidocaine applied to wound bed Off-Loading: Turn and reposition every 2 hours Other: - You can not be sitting in chair/wheelchair for long periods of time, you need to spend time in the bed laying on either side to keep  pressure off your bottom. You may be up in chair for breakfast, lunch and dinner for max an hour and a half, but then you should be in bed laying on either side changing position every two hours. WOUND #3: - Gluteus Wound Laterality: Left Cleanser: Soap and Water 1 x Per Day/30 Days Discharge Instructions: Gently cleanse wound with antibacterial soap, rinse and pat dry prior to dressing wounds Topical: calmoseptine 1 x Per Day/30 Days Discharge Instructions: apply daily or as needed WOUND #4: - Gluteus Wound Laterality: Right Cleanser: Soap and Water 1 x Per Day/30 Days Discharge Instructions: Gently cleanse wound with antibacterial soap, rinse and pat dry prior to dressing wounds Topical: calmoseptine 1 x Per Day/30 Days Discharge Instructions: apply daily or as needed 1. Based on what I am seeing I do believe the patient would benefit from a continuation of therapy with regard to her wound in particular. I think that Calmoseptine would be a good option here. We discussed the possibility of putting on a patch in order to protect the area but she did not want to go ahead and proceed with that at this point. Nonetheless we will get a give her a shot of the Calmoseptine for week and then we will see how things go. 2. I did advise the patient she needs to be diligent about offloading I think that she should be up no longer that about an hour and a half this could be for breakfast, lunch, and dinner. Been between time she needs to be in bed getting pressure off of this area and she needs to be sleeping in her bed. 3. I am also can recommend that she should along with the Calmoseptine clean the area well once a day before reapplying and then can reapply in between as necessary. She is in agreement with this plan. She knows that if this does not get better she agrees to continue with treatment here in the clinic and subsequently we will contemplate the possibility of going ahead and using a bordered  foam along with Xeroform that did well for her before I think this could definitely be an option. We will see patient back for reevaluation in 1 week here in the clinic. If anything worsens or changes patient will contact our office for additional recommendations. Electronic Signature(s) Signed: 05/23/2023 10:21:27 AM By: Allen Derry PA-C Entered By: Allen Derry on 05/23/2023 10:21:26 -------------------------------------------------------------------------------- ROS/PFSH Details Patient Name: Date of Service: [name], Madison [name] 05/23/2023 8:15 A M Medical Record Number: [id number] Patient Account Number: [id number] Date of Birth/Sex: Treating RN: Apr 05, 1932 (89 y.o. [name] [place], [place] ([id number]) [phone].pdf Page 8 of 10 Primary Care Provider: Elizabeth Sauer Other Clinician: Referring Provider: Treating Provider/Extender: Althia Forts Weeks in Treatment: 0 Information Obtained From Patient Caregiver Chart Constitutional Symptoms (General Health) Complaints and Symptoms: Negative for: Fatigue; Fever; Chills; Marked Weight Change Eyes Complaints and Symptoms: Negative for: Dry Eyes; Vision Changes; Glasses / Contacts Ear/Nose/Mouth/Throat Complaints and Symptoms: Negative for: Difficult clearing ears; Sinusitis Hematologic/Lymphatic Complaints and Symptoms: Negative for: Bleeding / Clotting Disorders; Human Immunodeficiency Virus Respiratory  Complaints and Symptoms: Negative for: Chronic or frequent coughs; Shortness of Breath Gastrointestinal Complaints and Symptoms: Negative for: Frequent diarrhea; Nausea; Vomiting Genitourinary Complaints and Symptoms: Negative for: Kidney failure/ Dialysis; Incontinence/dribbling Immunological Complaints and Symptoms: Negative for: Hives; Itching Integumentary (Skin) Complaints and Symptoms: Positive for: Wounds Medical History: Positive for: History of pressure  wounds Musculoskeletal Complaints and Symptoms: Positive for: Muscle Pain; Muscle Weakness Medical History: Positive for: Osteoarthritis Neurologic Complaints and Symptoms: Negative for: Numbness/parasthesias; Focal/Weakness Psychiatric Complaints and Symptoms: Negative for: Anxiety; Claustrophobia Cardiovascular Medical History: Positive for: Arrhythmia; Coronary Artery Disease; Hypertension Negative for: Congestive Heart Failure; Myocardial Infarction SHANEAL, BEVILLE ([id number]) [id number] Page 9 of 10 Endocrine Medical History: Positive for: Type II Diabetes Time with diabetes: 20+years Treated with: Oral agents Blood sugar tested every day: No Oncologic Immunizations Pneumococcal Vaccine: Received Pneumococcal Vaccination: Yes Received Pneumococcal Vaccination On or After 60th Birthday: Yes Implantable Devices None Family and Social History Never smoker; Marital Status - Widowed; Alcohol Use: Never; Drug Use: No History; Caffeine Use: Never Electronic Signature(s) Signed: 05/23/2023 4:14:56 PM By: Angelina Pih Signed: 05/23/2023 5:13:07 PM By: Allen Derry PA-C Entered By: Angelina Pih on 05/23/2023 08:41:16 -------------------------------------------------------------------------------- SuperBill Details Patient Name: Date of Service: [name], Madison [name] 05/23/2023 Medical Record Number: [id number] Patient Account Number: [id number] Date of Birth/Sex: Treating RN: 13-Dec-1931 (89 y.o. [name] Primary Care Provider: Elizabeth Sauer Other Clinician: Referring Provider: Treating Provider/Extender: Althia Forts Weeks in Treatment: 0 Diagnosis Coding ICD-10 Codes Code Description [id number] Pressure ulcer of left buttock, stage 3 L89.312 Pressure ulcer of right buttock, stage 2 E11.622 Type 2 diabetes mellitus with other skin ulcer I10 Essential (primary) hypertension I25.10 Atherosclerotic heart disease of native  coronary artery without angina pectoris Facility Procedures : CPT4 Code: [id number] Description: 99214 - WOUND CARE VISIT-LEV 4 EST PT Modifier: Quantity: 1 Physician Procedures : CPT4 Code Description Modifier [id number] 99214 - WC PHYS LEVEL 4 - EST PT ICD-10 Diagnosis Description L89.323 Pressure ulcer of left buttock, stage 3 L89.312 Pressure ulcer of right buttock, stage 2 SANIYAH, KEUP ([id number])  [id number] E11.622 Type 2 diabetes mellitus with other skin ulcer I10 Essential (primary) hypertension Quantity: 1 17.pdf Page 10 of 10 Electronic Signature(s) Signed: 05/23/2023 10:21:38 AM By: Allen Derry PA-C Entered By: Allen Derry on 05/23/2023 10:21:38

## 2023-05-24 DIAGNOSIS — L89322 Pressure ulcer of left buttock, stage 2: Secondary | ICD-10-CM | POA: Diagnosis not present

## 2023-05-24 DIAGNOSIS — I872 Venous insufficiency (chronic) (peripheral): Secondary | ICD-10-CM | POA: Diagnosis not present

## 2023-05-24 DIAGNOSIS — I350 Nonrheumatic aortic (valve) stenosis: Secondary | ICD-10-CM | POA: Diagnosis not present

## 2023-05-24 DIAGNOSIS — I119 Hypertensive heart disease without heart failure: Secondary | ICD-10-CM | POA: Diagnosis not present

## 2023-05-24 DIAGNOSIS — E119 Type 2 diabetes mellitus without complications: Secondary | ICD-10-CM | POA: Diagnosis not present

## 2023-05-24 DIAGNOSIS — G894 Chronic pain syndrome: Secondary | ICD-10-CM | POA: Diagnosis not present

## 2023-05-26 DIAGNOSIS — G894 Chronic pain syndrome: Secondary | ICD-10-CM | POA: Diagnosis not present

## 2023-05-26 DIAGNOSIS — I119 Hypertensive heart disease without heart failure: Secondary | ICD-10-CM | POA: Diagnosis not present

## 2023-05-26 DIAGNOSIS — I350 Nonrheumatic aortic (valve) stenosis: Secondary | ICD-10-CM | POA: Diagnosis not present

## 2023-05-26 DIAGNOSIS — I872 Venous insufficiency (chronic) (peripheral): Secondary | ICD-10-CM | POA: Diagnosis not present

## 2023-05-26 DIAGNOSIS — L89322 Pressure ulcer of left buttock, stage 2: Secondary | ICD-10-CM | POA: Diagnosis not present

## 2023-05-26 DIAGNOSIS — E119 Type 2 diabetes mellitus without complications: Secondary | ICD-10-CM | POA: Diagnosis not present

## 2023-05-30 ENCOUNTER — Telehealth: Payer: Self-pay | Admitting: Family Medicine

## 2023-05-30 ENCOUNTER — Telehealth: Payer: Self-pay

## 2023-05-30 NOTE — Telephone Encounter (Signed)
Copied from CRM [id number]. Topic: General - Other >> May 30, 2023 12:21 PM Santiya F wrote: Reason for CRM: Home Health Verbal Orders - Caller/Agency: [facility] Callback Number: [phone]  Requesting OT/PT/Skilled Nursing/Social Work/Speech Therapy: PT  Frequency: 1 Week 1, 2 Week 3, 1 Week 3, for gate balancing and strengthening.

## 2023-05-30 NOTE — Telephone Encounter (Signed)
Returned phone call to [place] [facility] [phone]- ok to proceed with PT

## 2023-05-31 ENCOUNTER — Telehealth: Payer: Self-pay | Admitting: Family Medicine

## 2023-05-31 NOTE — Telephone Encounter (Signed)
Copied from CRM [id number]. Topic: Quick Communication - Home Health Verbal Orders >> May 31, 2023  7:52 AM Everette C wrote: Caller/Agency: [facility]: Lambert Rhonda W  Callback Number: [phone] Requesting OT/PT/Skilled Nursing/Social Work/Speech Therapy: skilled nursing  Frequency: 1w6 for wound care

## 2023-06-01 ENCOUNTER — Telehealth: Payer: Self-pay

## 2023-06-01 NOTE — Telephone Encounter (Signed)
Nursing 1 x week x 4 weeks [facility] skilled nursing- [phone]- gave the okay to proceed

## 2023-06-02 ENCOUNTER — Encounter: Payer: Medicare Other | Admitting: Physician Assistant

## 2023-06-02 DIAGNOSIS — I2510 Atherosclerotic heart disease of native coronary artery without angina pectoris: Secondary | ICD-10-CM | POA: Diagnosis not present

## 2023-06-02 DIAGNOSIS — L89323 Pressure ulcer of left buttock, stage 3: Secondary | ICD-10-CM | POA: Diagnosis not present

## 2023-06-02 DIAGNOSIS — E11622 Type 2 diabetes mellitus with other skin ulcer: Secondary | ICD-10-CM | POA: Diagnosis not present

## 2023-06-02 DIAGNOSIS — I10 Essential (primary) hypertension: Secondary | ICD-10-CM | POA: Diagnosis not present

## 2023-06-02 DIAGNOSIS — L89312 Pressure ulcer of right buttock, stage 2: Secondary | ICD-10-CM | POA: Diagnosis not present

## 2023-06-02 DIAGNOSIS — E1151 Type 2 diabetes mellitus with diabetic peripheral angiopathy without gangrene: Secondary | ICD-10-CM | POA: Diagnosis not present

## 2023-06-02 DIAGNOSIS — M1990 Unspecified osteoarthritis, unspecified site: Secondary | ICD-10-CM | POA: Diagnosis not present

## 2023-06-02 NOTE — Progress Notes (Addendum)
[name], [name] ([id number]) [id number] Page 1 of 7 Visit Report for 06/02/2023 Chief Complaint Document Details Patient Name: Date of Service: [place], [name]. 06/02/2023 11:15 A M Medical Record Number: [id number] Patient Account Number: [id number] Date of Birth/Sex: Treating RN: [birth year] (89 y.o. [name] Primary Care Provider: Elizabeth Sauer Other Clinician: Betha Loa Referring Provider: Treating Provider/Extender: Althia Forts Weeks in Treatment: 1 Information Obtained from: Patient Chief Complaint Bilateral buttock ulcers Electronic Signature(s) Signed: 06/02/2023 11:30:42 AM By: Allen Derry PA-C Entered By: Allen Derry on 06/02/2023 11:30:42 -------------------------------------------------------------------------------- HPI Details Patient Name: Date of Service: [name], [name] W. 06/02/2023 11:15 A M Medical Record Number: [id number] Patient Account Number: [id number] Date of Birth/Sex: Treating RN: 1932/10/15 (89 y.o. [name] Primary Care Provider: Elizabeth Sauer Other Clinician: Betha Loa Referring Provider: Treating Provider/Extender: Althia Forts Weeks in Treatment: 1 History of Present Illness HPI Description: 03-04-2022 upon evaluation today patient appears to be doing poorly in regard to her gluteal region. Its mainly in the left gluteal region where there is an open ulceration at this point. This does appear to be a stage III pressure ulcer. Fortunately there does not appear to be any evidence of active infection at this site locally nor systemically which is also news. The patient has been counseled by her daughter who is present with her today her son was also on the phone we had a good discussion at this point. Nonetheless they have been trying to get her to not sit as long as they knew that was the major issue the patient tells me however she has a hard time getting up because  of her knee. She has had of extensive work-up and treatment for the knee and fortunately has been determined this is just arthritis and there is really not much more that they can do. Nonetheless I think she is going to have to get up and move around or at least get up and stand and allow for blood flow into the gluteal area or out she is can end up with more significant wounds than what we have and see right now. Patient does have a history of diabetes mellitus type 2, hypertension, and coronary artery disease. This wound has been present for at this point roughly 1 month beginning on or around February 04, 2022. 03-21-2022 upon evaluation today patient's wound actually showing signs of excellent improvement I am very pleased with where we stand today there does not appear to be any signs of infection which is great news and overall I think we are headed in the right direction. This is not terribly smaller but it is also looking better than what it was as far as the health of the surface of the wound is concerned. 04-15-2022 upon evaluation today patient appears to be doing well in regard to 1 side of her gluteal area not as well and the other that was healed last time. The [name] ([id number]) [id number] Page 2 of 7 right side is reopened which was previously closed. The left side is not as bad and in fact is very close to complete closure. Fortunately I do not see any evidence of active infection locally or systemically at this time which is great news. Overall I think that we are headed in the right direction but still regular probably need to put something on both sides to protect at this point and try to get these areas healed. 04-29-2022 upon evaluation  today patient appears to be doing well with regard to her wounds both are shown signs of improvement and very pleased in that regard. I do not see evidence of infection locally or systemically currently. 05-13-2022  upon evaluation today patient's wounds are showing signs of some improvement though she does have some need for sharp debridement today as well. I did have a pretty lengthy discussion with her about what causes these wounds and how to prevent them. She voiced understanding I know she has a hard time standing up and moving around but even if she can just stand at the side of her bed this would be something. She voiced understanding. 05-27-2022 upon evaluation today patient appears to be doing okay in regard to her wound. Fortunately there does not appear to be any signs of active infection locally or systemically at this time which is great news. No fevers, chills, nausea, vomiting, or diarrhea. With that being said both wounds are still open but unfortunately she is having a lot of issues with discomfort and the dressing just are not staying in place. Fortunately I do not see again in the significant drainage which is good news. 06-10-2022 upon evaluation today patient appears to be doing better in regard to her wounds. They have been using the Desitin and they do feel like this is doing quite well. Fortunately I do not see any evidence of active infection locally or systemically at this time. No fevers, chills, nausea, vomiting, or diarrhea. 07-08-2022 upon evaluation today patient appears to be doing well currently in regard to her wound in the gluteal region. In fact this appears to be completely healed based on what I am seeing in. Fortunately I do not see any signs of active infection at this time which is great news. Readmission: 05-23-2023 upon evaluation today patient appears to be doing poorly currently in regard to her gluteal region. This is a patient whom I am familiar with I did see her last year from roughly April through what we healed her in August of that time. With that being said she has similar issues right now going on where she does have breakdown in the bilateral gluteal regions. She  does upon further questioning sleep in a recliner which is not optimal and not something that we discussed previous we discussed that again today. She also spends majority of her day sitting up in either wheelchair or in the recliner. Subsequently I think that this is a major culprit for why this is breaking down. Patient's past medical history really has not changed significantly she continues to have diabetes, hypertension, and history of coronary artery disease. 06-02-2023 upon evaluation today patient appears to be doing well currently in regard to her wounds. She was unable to keep any dressings in place so they just transition to using the Calmoseptine which has been beneficial. Fortunately I am not seeing anything that appears to be worse at this point which is great news. Electronic Signature(s) Signed: 06/02/2023 1:45:52 PM By: Allen Derry PA-C Entered By: Allen Derry on 06/02/2023 13:45:52 -------------------------------------------------------------------------------- Physical Exam Details Patient Name: Date of Service: [name], [name] W. 06/02/2023 11:15 A M Medical Record Number: [id number] Patient Account Number: [id number] Date of Birth/Sex: Treating RN: 1931-12-05 (89 y.o. [name] Primary Care Provider: Elizabeth Sauer Other Clinician: Betha Loa Referring Provider: Treating Provider/Extender: Althia Forts Weeks in Treatment: 1 Constitutional Obese and well-hydrated in no acute distress. Respiratory normal breathing without difficulty. Psychiatric this patient is  able to make decisions and demonstrates good insight into disease process. Alert and Oriented x 3. pleasant and cooperative. Notes Upon inspection patient's wounds are actually significantly improved she seems to be doing excellent I think continue with the Calmoseptine followed by putting the cream on twice a day will make a big difference here. Also think appropriate offloading which she  has been doing will also continue to aid her in getting this healed. Electronic Signature(s) Signed: 06/02/2023 1:46:16 PM By: Allen Derry PA-C Entered By: Allen Derry on 06/02/2023 13:46:16 [name] ([id number]) [id number] Page 3 of 7 -------------------------------------------------------------------------------- Physician Orders Details Patient Name: Date of Service: [name], [name] W. 06/02/2023 11:15 A M Medical Record Number: [id number] Patient Account Number: [id number] Date of Birth/Sex: Treating RN: August 27, 1932 (89 y.o. Ginette Pitman Primary Care Provider: Elizabeth Sauer Other Clinician: Betha Loa Referring Provider: Treating Provider/Extender: Althia Forts Weeks in Treatment: 1 Verbal / Phone Orders: Yes Clinician: Midge Aver Read Back and Verified: Yes Diagnosis Coding ICD-10 Coding Code Description [phone] Pressure ulcer of left buttock, stage 3 L89.312 Pressure ulcer of right buttock, stage 2 E11.622 Type 2 diabetes mellitus with other skin ulcer I10 Essential (primary) hypertension I25.10 Atherosclerotic heart disease of native coronary artery without angina pectoris Follow-up Appointments Return Appointment in 1 week. Bathing/ [employer] wounds with antibacterial soap and water. May shower; gently cleanse wound with antibacterial soap, rinse and pat dry prior to dressing wounds No tub bath. Anesthetic (Use 'Patient Medications' Section for Anesthetic Order Entry) Lidocaine applied to wound bed Off-Loading Turn and reposition every 2 hours Other: - You can not be sitting in chair/wheelchair for long periods of time, you need to spend time in the bed laying on either side to keep pressure off your bottom. You may be up in chair for breakfast, lunch and dinner for max an hour and a half, but then you should be in bed laying on either side changing position every two hours. Wound Treatment Wound #3 -  Gluteus Wound Laterality: Left Cleanser: Soap and Water 2 x Per Day/30 Days Discharge Instructions: Gently cleanse wound with antibacterial soap, rinse and pat dry prior to dressing wounds Topical: calmoseptine 2 x Per Day/30 Days Discharge Instructions: apply daily or as needed Wound #4 - Gluteus Wound Laterality: Right Cleanser: Soap and Water 2 x Per Day/30 Days Discharge Instructions: Gently cleanse wound with antibacterial soap, rinse and pat dry prior to dressing wounds Topical: calmoseptine 2 x Per Day/30 Days Discharge Instructions: apply daily or as needed Electronic Signature(s) Signed: 06/02/2023 2:27:58 PM By: Allen Derry PA-C Signed: 06/05/2023 4:39:16 PM By: Midge Aver MSN RN CNS WTA Entered By: Midge Aver on 06/02/2023 11:47:16 [name] ([id number]) [id number] Page 4 of 7 -------------------------------------------------------------------------------- Problem List Details Patient Name: Date of Service: [name], [name] W. 06/02/2023 11:15 A M Medical Record Number: [id number] Patient Account Number: [id number] Date of Birth/Sex: Treating RN: January 29, 1932 (89 y.o. [name] Primary Care Provider: Elizabeth Sauer Other Clinician: Betha Loa Referring Provider: Treating Provider/Extender: Althia Forts Weeks in Treatment: 1 Active Problems ICD-10 Encounter Code Description Active Date MDM Diagnosis [phone] Pressure ulcer of left buttock, stage 3 05/23/2023 No Yes L89.312 Pressure ulcer of right buttock, stage 2 05/23/2023 No Yes E11.622 Type 2 diabetes mellitus with other skin ulcer 05/23/2023 No Yes I10 Essential (primary) hypertension 05/23/2023 No Yes I25.10 Atherosclerotic heart disease of native coronary artery without angina pectoris 05/23/2023 No Yes Inactive Problems Resolved Problems  Electronic Signature(s) Signed: 06/02/2023 11:30:40 AM By: Allen Derry PA-C Entered By: Allen Derry on 06/02/2023  11:30:39 -------------------------------------------------------------------------------- Progress Note Details Patient Name: Date of Service: [name], [name] W. 06/02/2023 11:15 A M Medical Record Number: [id number] Patient Account Number: [id number] Date of Birth/Sex: Treating RN: [birth year] (89 y.o. [name] Primary Care Provider: Elizabeth Sauer Other Clinician: Gradie, Butrick ([id number]) [phone].pdf Page 5 of 7 Referring Provider: Treating Provider/Extender: Althia Forts Weeks in Treatment: 1 Subjective Chief Complaint Information obtained from Patient Bilateral buttock ulcers History of Present Illness (HPI) 03-04-2022 upon evaluation today patient appears to be doing poorly in regard to her gluteal region. Its mainly in the left gluteal region where there is an open ulceration at this point. This does appear to be a stage III pressure ulcer. Fortunately there does not appear to be any evidence of active infection at this site locally nor systemically which is also news. The patient has been counseled by her daughter who is present with her today her son was also on the phone we had a good discussion at this point. Nonetheless they have been trying to get her to not sit as long as they knew that was the major issue the patient tells me however she has a hard time getting up because of her knee. She has had of extensive work-up and treatment for the knee and fortunately has been determined this is just arthritis and there is really not much more that they can do. Nonetheless I think she is going to have to get up and move around or at least get up and stand and allow for blood flow into the gluteal area or out she is can end up with more significant wounds than what we have and see right now. Patient does have a history of diabetes mellitus type 2, hypertension, and coronary artery disease. This wound has been present for  at this point roughly 1 month beginning on or around February 04, 2022. 03-21-2022 upon evaluation today patient's wound actually showing signs of excellent improvement I am very pleased with where we stand today there does not appear to be any signs of infection which is great news and overall I think we are headed in the right direction. This is not terribly smaller but it is also looking better than what it was as far as the health of the surface of the wound is concerned. 04-15-2022 upon evaluation today patient appears to be doing well in regard to 1 side of her gluteal area not as well and the other that was healed last time. The right side is reopened which was previously closed. The left side is not as bad and in fact is very close to complete closure. Fortunately I do not see any evidence of active infection locally or systemically at this time which is great news. Overall I think that we are headed in the right direction but still regular probably need to put something on both sides to protect at this point and try to get these areas healed. 04-29-2022 upon evaluation today patient appears to be doing well with regard to her wounds both are shown signs of improvement and very pleased in that regard. I do not see evidence of infection locally or systemically currently. 05-13-2022 upon evaluation today patient's wounds are showing signs of some improvement though she does have some need for sharp debridement today as well. I did have a pretty lengthy discussion with her about  what causes these wounds and how to prevent them. She voiced understanding I know she has a hard time standing up and moving around but even if she can just stand at the side of her bed this would be something. She voiced understanding. 05-27-2022 upon evaluation today patient appears to be doing okay in regard to her wound. Fortunately there does not appear to be any signs of active infection locally or systemically at this time  which is great news. No fevers, chills, nausea, vomiting, or diarrhea. With that being said both wounds are still open but unfortunately she is having a lot of issues with discomfort and the dressing just are not staying in place. Fortunately I do not see again in the significant drainage which is good news. 06-10-2022 upon evaluation today patient appears to be doing better in regard to her wounds. They have been using the Desitin and they do feel like this is doing quite well. Fortunately I do not see any evidence of active infection locally or systemically at this time. No fevers, chills, nausea, vomiting, or diarrhea. 07-08-2022 upon evaluation today patient appears to be doing well currently in regard to her wound in the gluteal region. In fact this appears to be completely healed based on what I am seeing in. Fortunately I do not see any signs of active infection at this time which is great news. Readmission: 05-23-2023 upon evaluation today patient appears to be doing poorly currently in regard to her gluteal region. This is a patient whom I am familiar with I did see her last year from roughly April through what we healed her in August of that time. With that being said she has similar issues right now going on where she does have breakdown in the bilateral gluteal regions. She does upon further questioning sleep in a recliner which is not optimal and not something that we discussed previous we discussed that again today. She also spends majority of her day sitting up in either wheelchair or in the recliner. Subsequently I think that this is a major culprit for why this is breaking down. Patient's past medical history really has not changed significantly she continues to have diabetes, hypertension, and history of coronary artery disease. 06-02-2023 upon evaluation today patient appears to be doing well currently in regard to her wounds. She was unable to keep any dressings in place so they  just transition to using the Calmoseptine which has been beneficial. Fortunately I am not seeing anything that appears to be worse at this point which is great news. Objective Constitutional Obese and well-hydrated in no acute distress. Vitals Time Taken: 11:29 AM, Height: 63 in, Weight: 163 lbs, BMI: 28.9, Temperature: 98.0 F, Pulse: 53 bpm, Respiratory Rate: 18 breaths/min, Blood Pressure: 146/51 mmHg. Respiratory normal breathing without difficulty. Psychiatric this patient is able to make decisions and demonstrates good insight into disease process. Alert and Oriented x 3. pleasant and cooperative. General Notes: Upon inspection patient's wounds are actually significantly improved she seems to be doing excellent I think continue with the Calmoseptine followed by putting the cream on twice a day will make a big difference here. Also think appropriate offloading which she has been doing will also continue to aid her in getting this healed. [name], [name] ([id number]) [id number] Page 6 of 7 Integumentary (Hair, Skin) Wound #3 status is Open. Original cause of wound was Pressure Injury. The date acquired was: 04/22/2023. The wound has been in treatment 1 weeks. The wound is located  on the Left Gluteus. The wound measures 0.5cm length x 0.8cm width x 0.2cm depth; 0.314cm^2 area and 0.063cm^3 volume. There is Fat Layer (Subcutaneous Tissue) exposed. There is a medium amount of serosanguineous drainage noted. There is large (67-100%) red, pink granulation within the wound bed. There is a small (1-33%) amount of necrotic tissue within the wound bed including Adherent Slough. Wound #4 status is Open. Original cause of wound was Pressure Injury. The date acquired was: 04/22/2023. The wound has been in treatment 1 weeks. The wound is located on the Right Gluteus. The wound measures 3cm length x 1cm width x 0.1cm depth; 2.356cm^2 area and 0.236cm^3 volume. There is a medium  amount of serosanguineous drainage noted. There is large (67-100%) red, pink granulation within the wound bed. There is no necrotic tissue within the wound bed. Assessment Active Problems ICD-10 Pressure ulcer of left buttock, stage 3 Pressure ulcer of right buttock, stage 2 Type 2 diabetes mellitus with other skin ulcer Essential (primary) hypertension Atherosclerotic heart disease of native coronary artery without angina pectoris Plan Follow-up Appointments: Return Appointment in 1 week. Bathing/ Shower/ Hygiene: Wash wounds with antibacterial soap and water. May shower; gently cleanse wound with antibacterial soap, rinse and pat dry prior to dressing wounds No tub bath. Anesthetic (Use 'Patient Medications' Section for Anesthetic Order Entry): Lidocaine applied to wound bed Off-Loading: Turn and reposition every 2 hours Other: - You can not be sitting in chair/wheelchair for long periods of time, you need to spend time in the bed laying on either side to keep pressure off your bottom. You may be up in chair for breakfast, lunch and dinner for max an hour and a half, but then you should be in bed laying on either side changing position every two hours. WOUND #3: - Gluteus Wound Laterality: Left Cleanser: Soap and Water 2 x Per Day/30 Days Discharge Instructions: Gently cleanse wound with antibacterial soap, rinse and pat dry prior to dressing wounds Topical: calmoseptine 2 x Per Day/30 Days Discharge Instructions: apply daily or as needed WOUND #4: - Gluteus Wound Laterality: Right Cleanser: Soap and Water 2 x Per Day/30 Days Discharge Instructions: Gently cleanse wound with antibacterial soap, rinse and pat dry prior to dressing wounds Topical: calmoseptine 2 x Per Day/30 Days Discharge Instructions: apply daily or as needed 1. Regular continue with Calmoseptine they were not able to keep any dressings in place but this seems to be doing excellent the wounds are  actually measuring better and looking much better which is great news. 2. I am also can recommend that we have her continue with appropriate offloading which I think is good to be of utmost importance in fact I think this can be one of the most important aspects of what she needs to do at this point. We will see patient back for reevaluation in 1 week here in the clinic. If anything worsens or changes patient will contact our office for additional recommendations. Electronic Signature(s) Signed: 06/02/2023 1:46:39 PM By: Allen Derry PA-C Entered By: Allen Derry on 06/02/2023 13:46:39 [name] ([id number]) [id number] Page 7 of 7 -------------------------------------------------------------------------------- SuperBill Details Patient Name: Date of Service: [name], [name] W. 06/02/2023 Medical Record Number: [id number] Patient Account Number: [id number] Date of Birth/Sex: Treating RN: [birth year] (89 y.o. Ginette Pitman Primary Care Provider: Elizabeth Sauer Other Clinician: Betha Loa Referring Provider: Treating Provider/Extender: Althia Forts Weeks in Treatment: 1 Diagnosis Coding ICD-10 Codes Code Description [phone] Pressure ulcer of left buttock, stage  3 L89.312 Pressure ulcer of right buttock, stage 2 E11.622 Type 2 diabetes mellitus with other skin ulcer I10 Essential (primary) hypertension I25.10 Atherosclerotic heart disease of native coronary artery without angina pectoris Facility Procedures : CPT4 Code: [id number] Description: 99213 - WOUND CARE VISIT-LEV 3 EST PT Modifier: Quantity: 1 Physician Procedures : CPT4 Code Description Modifier [id number] - WC PHYS LEVEL 3 - EST PT ICD-10 Diagnosis Description L89.323 Pressure ulcer of left buttock, stage 3 L89.312 Pressure ulcer of right buttock, stage 2 E11.622 Type 2 diabetes mellitus with other skin ulcer  I10 Essential (primary) hypertension Quantity: 1 Electronic  Signature(s) Signed: 06/02/2023 1:46:50 PM By: Allen Derry PA-C Entered By: Allen Derry on 06/02/2023 13:46:50

## 2023-06-05 NOTE — Progress Notes (Signed)
LAKEVA, HOLLON ([id number]) [id number] Page 1 of 9 Visit Report for 06/02/2023 Arrival Information Details Patient Name: Date of Service: [place], [place]. 06/02/2023 11:15 A M Medical Record Number: [id number] Patient Account Number: [id number] Date of Birth/Sex: Treating RN: 02-09-1932 (89 y.o. Ginette Pitman Primary Care Atia Haupt: Elizabeth Sauer Other Clinician: Betha Loa Referring Jacquelynne Guedes: Treating Tigran Haynie/Extender: Althia Forts Weeks in Treatment: 1 Visit Information History Since Last Visit All ordered tests and consults were completed: No Patient Arrived: Wheel Chair Added or deleted any medications: No Arrival Time: 11:26 Any new allergies or adverse reactions: No Transfer Assistance: EasyPivot Patient Lift Had a fall or experienced change in No Patient Identification Verified: Yes activities of daily living that may affect Secondary Verification Process Completed: Yes risk of falls: Patient Has Alerts: Yes Signs or symptoms of abuse/neglect since last visito No Patient Alerts: type 2 diabetic Hospitalized since last visit: No Implantable device outside of the clinic excluding No cellular tissue based products placed in the center since last visit: Has Dressing in Place as Prescribed: Yes Pain Present Now: No Electronic Signature(s) Signed: 06/05/2023 4:39:16 PM By: Midge Aver MSN RN CNS WTA Entered By: Midge Aver on 06/02/2023 11:28:42 -------------------------------------------------------------------------------- Clinic Level of Care Assessment Details Patient Name: Date of Service: [name], [name] 06/02/2023 11:15 A M Medical Record Number: [id number] Patient Account Number: [id number] Date of Birth/Sex: Treating RN: Mar 28, 1932 (89 y.o. Ginette Pitman Primary Care Dimitra Woodstock: Elizabeth Sauer Other Clinician: Betha Loa Referring Damonte Frieson: Treating Monti Villers/Extender: Althia Forts Weeks in  Treatment: 1 Clinic Level of Care Assessment Items TOOL 4 Quantity Score []  - 0 Use when only an EandM is performed on FOLLOW-UP visit ASSESSMENTS - Nursing Assessment / Reassessment X- 1 10 Reassessment of Co-morbidities (includes updates in patient status) X- 1 5 Reassessment of Adherence to Treatment Plan HATLEY, HENEGAR ([id number]) [id number] Page 2 of 9 ASSESSMENTS - Wound and Skin A ssessment / Reassessment []  - 0 Simple Wound Assessment / Reassessment - one wound X- 2 5 Complex Wound Assessment / Reassessment - multiple wounds []  - 0 Dermatologic / Skin Assessment (not related to wound area) ASSESSMENTS - Focused Assessment []  - 0 Circumferential Edema Measurements - multi extremities []  - 0 Nutritional Assessment / Counseling / Intervention []  - 0 Lower Extremity Assessment (monofilament, tuning fork, pulses) []  - 0 Peripheral Arterial Disease Assessment (using hand held doppler) ASSESSMENTS - Ostomy and/or Continence Assessment and Care []  - 0 Incontinence Assessment and Management []  - 0 Ostomy Care Assessment and Management (repouching, etc.) PROCESS - Coordination of Care X - Simple Patient / Family Education for ongoing care 1 15 []  - 0 Complex (extensive) Patient / Family Education for ongoing care []  - 0 Staff obtains Chiropractor, Records, T Results / Process Orders est []  - 0 Staff telephones HHA, Nursing Homes / Clarify orders / etc []  - 0 Routine Transfer to another Facility (non-emergent condition) []  - 0 Routine Hospital Admission (non-emergent condition) []  - 0 New Admissions / Manufacturing engineer / Ordering NPWT Apligraf, etc. , []  - 0 Emergency Hospital Admission (emergent condition) X- 1 10 Simple Discharge Coordination []  - 0 Complex (extensive) Discharge Coordination PROCESS - Special Needs []  - 0 Pediatric / Minor Patient Management []  - 0 Isolation Patient Management []  - 0 Hearing / Language /  Visual special needs []  - 0 Assessment of Community assistance (transportation, D/C planning, etc.) []  - 0 Additional assistance / Altered mentation []  - 0 Support  Surface(s) Assessment (bed, cushion, seat, etc.) INTERVENTIONS - Wound Cleansing / Measurement []  - 0 Simple Wound Cleansing - one wound X- 2 5 Complex Wound Cleansing - multiple wounds X- 1 5 Wound Imaging (photographs - any number of wounds) []  - 0 Wound Tracing (instead of photographs) []  - 0 Simple Wound Measurement - one wound X- 1 5 Complex Wound Measurement - multiple wounds INTERVENTIONS - Wound Dressings X - Small Wound Dressing one or multiple wounds 2 10 []  - 0 Medium Wound Dressing one or multiple wounds []  - 0 Large Wound Dressing one or multiple wounds []  - 0 Application of Medications - topical []  - 0 Application of Medications - injection INTERVENTIONS - Miscellaneous []  - 0 External ear exam AINSLEIGH, KAKOS ([id number]) [id number] Page 3 of 9 []  - 0 Specimen Collection (cultures, biopsies, blood, body fluids, etc.) []  - 0 Specimen(s) / Culture(s) sent or taken to Lab for analysis X- 1 10 Patient Transfer (multiple staff / Michiel Sites Lift / Similar devices) []  - 0 Simple Staple / Suture removal (25 or less) []  - 0 Complex Staple / Suture removal (26 or more) []  - 0 Hypo / Hyperglycemic Management (close monitor of Blood Glucose) []  - 0 Ankle / Brachial Index (ABI) - do not check if billed separately X- 1 5 Vital Signs Has the patient been seen at the hospital within the last three years: Yes Total Score: 105 Level Of Care: New/Established - Level 3 Electronic Signature(s) Signed: 06/05/2023 4:39:16 PM By: Midge Aver MSN RN CNS WTA Entered By: Midge Aver on 06/02/2023 11:48:24 -------------------------------------------------------------------------------- Lower Extremity Assessment Details Patient Name: Date of Service: [place] Georgeanne Nim, [name] 06/02/2023 11:15 A  M Medical Record Number: [id number] Patient Account Number: [id number] Date of Birth/Sex: Treating RN: August 25, 1932 (89 y.o. Ginette Pitman Primary Care Michie Molnar: Elizabeth Sauer Other Clinician: Betha Loa Referring Babak Lucus: Treating Shaya Reddick/Extender: Althia Forts Weeks in Treatment: 1 Electronic Signature(s) Signed: 06/05/2023 4:39:16 PM By: Midge Aver MSN RN CNS WTA Entered By: Midge Aver on 06/02/2023 11:39:38 -------------------------------------------------------------------------------- Multi Wound Chart Details Patient Name: Date of Service: Jodell Cipro, [name] 06/02/2023 11:15 A M Medical Record Number: [id number] Patient Account Number: [id number] Date of Birth/Sex: Treating RN: 07-27-1932 (89 y.o. Ginette Pitman Primary Care Lucas Exline: Elizabeth Sauer Other Clinician: Betha Loa Referring Katura Eatherly: Treating Yara Tomkinson/Extender: Althia Forts Weeks in Treatment: 1 Vital Signs Height(in): 63 Pulse(bpm): [address] ([id number]) [id number] Page 4 of 9 Weight(lbs): 163 Blood Pressure(mmHg): 146/51 Body Mass Index(BMI): 28.9 Temperature(F): 98.0 Respiratory Rate(breaths/min): 18 [3:Photos:] [N/A:N/A] Left Gluteus Right Gluteus N/A Wound Location: Pressure Injury Pressure Injury N/A Wounding Event: Pressure Ulcer Pressure Ulcer N/A Primary Etiology: Arrhythmia, Coronary Artery Disease, Arrhythmia, Coronary Artery Disease, N/A Comorbid History: Hypertension, Type II Diabetes, Hypertension, Type II Diabetes, History of pressure wounds, History of pressure wounds, Osteoarthritis Osteoarthritis 04/22/2023 04/22/2023 N/A Date Acquired: 1 1 N/A Weeks of Treatment: Open Open N/A Wound Status: No No N/A Wound Recurrence: 0.5x0.8x0.2 3x1x0.1 N/A Measurements L x W x D (cm) 0.314 2.356 N/A A (cm) : rea 0.063 0.236 N/A Volume (cm) : 77.10% -200.10% N/A % Reduction in A rea: 54.00% -198.70% N/A %  Reduction in Volume: Category/Stage III Category/Stage II N/A Classification: Medium Medium N/A Exudate A mount: Serosanguineous Serosanguineous N/A Exudate Type: red, brown red, brown N/A Exudate Color: Large (67-100%) Large (67-100%) N/A Granulation A mount: Red, Pink Red, Pink N/A Granulation Quality: Small (1-33%) None Present (0%) N/A Necrotic A mount: Fat  Layer (Subcutaneous Tissue): Yes N/A N/A Exposed Structures: None None N/A Epithelialization: Treatment Notes Electronic Signature(s) Signed: 06/05/2023 4:39:16 PM By: Midge Aver MSN RN CNS WTA Entered By: Midge Aver on 06/02/2023 11:39:42 -------------------------------------------------------------------------------- Multi-Disciplinary Care Plan Details Patient Name: Date of Service: Jodell Cipro, [name] 06/02/2023 11:15 A M Medical Record Number: [id number] Patient Account Number: [id number] Date of Birth/Sex: Treating RN: [birth year] (89 y.o. Ginette Pitman Primary Care Madoline Bhatt: Elizabeth Sauer Other Clinician: Betha Loa Referring Tenia Goh: Treating Chasty Randal/Extender: Althia Forts Weeks in Treatment: 1 Active Inactive Orientation to the [facility] KHILYNN, BORNTREGER ([id number]) [id number] Page 5 of 9 Nursing Diagnoses: Knowledge deficit related to the wound healing center program Goals: Patient/caregiver will verbalize understanding of the Wound Healing Center Program Date Initiated: 05/23/2023 Target Resolution Date: 05/30/2023 Goal Status: Active Interventions: Provide education on orientation to the wound center Notes: Pressure Nursing Diagnoses: Knowledge deficit related to causes and risk factors for pressure ulcer development Knowledge deficit related to management of pressures ulcers Potential for impaired tissue integrity related to pressure, friction, moisture, and shear Goals: Patient will remain free from development of additional pressure  ulcers Date Initiated: 05/23/2023 Target Resolution Date: 07/18/2023 Goal Status: Active Patient will remain free of pressure ulcers Date Initiated: 05/23/2023 Target Resolution Date: 07/18/2023 Goal Status: Active Patient/caregiver will verbalize risk factors for pressure ulcer development Date Initiated: 05/23/2023 Target Resolution Date: 05/30/2023 Goal Status: Active Patient/caregiver will verbalize understanding of pressure ulcer management Date Initiated: 05/23/2023 Target Resolution Date: 05/30/2023 Goal Status: Active Interventions: Assess: immobility, friction, shearing, incontinence upon admission and as needed Assess offloading mechanisms upon admission and as needed Assess potential for pressure ulcer upon admission and as needed Provide education on pressure ulcers Notes: Wound/Skin Impairment Nursing Diagnoses: Impaired tissue integrity Knowledge deficit related to ulceration/compromised skin integrity Goals: Ulcer/skin breakdown will have a volume reduction of 30% by week 4 Date Initiated: 05/23/2023 Target Resolution Date: 06/20/2023 Goal Status: Active Ulcer/skin breakdown will have a volume reduction of 50% by week 8 Date Initiated: 05/23/2023 Target Resolution Date: 07/18/2023 Goal Status: Active Ulcer/skin breakdown will have a volume reduction of 80% by week 12 Date Initiated: 05/23/2023 Target Resolution Date: 08/15/2023 Goal Status: Active Ulcer/skin breakdown will heal within 14 weeks Date Initiated: 05/23/2023 Target Resolution Date: 08/29/2023 Goal Status: Active Interventions: Assess patient/caregiver ability to obtain necessary supplies Assess patient/caregiver ability to perform ulcer/skin care regimen upon admission and as needed Assess ulceration(s) every visit Provide education on ulcer and skin care Treatment Activities: Topical wound management initiated : 05/23/2023 Notes: Electronic Signature(s) Signed: 06/05/2023 4:39:16 PM By: Midge Aver MSN RN CNS  Luz Lex ([id number]) [id number] Page 6 of 9 Entered By: Midge Aver on 06/02/2023 11:48:33 -------------------------------------------------------------------------------- Pain Assessment Details Patient Name: Date of Service: Jodell Cipro, [place] [name] 06/02/2023 11:15 A M Medical Record Number: [id number] Patient Account Number: [id number] Date of Birth/Sex: Treating RN: Mar 31, 1932 (89 y.o. Ginette Pitman Primary Care Vallie Teters: Elizabeth Sauer Other Clinician: Betha Loa Referring Lynwood Kubisiak: Treating Arnez Stoneking/Extender: Althia Forts Weeks in Treatment: 1 Active Problems Location of Pain Severity and Description of Pain Patient Has Paino No Site Locations Pain Management and Medication Current Pain Management: Electronic Signature(s) Signed: 06/05/2023 4:39:16 PM By: Midge Aver MSN RN CNS WTA Entered By: Midge Aver on 06/02/2023 11:38:46 -------------------------------------------------------------------------------- Wound Assessment Details Patient Name: Date of Service: Jodell Cipro, [name] 06/02/2023 11:15 A M Medical Record Number: [id number] Patient Account Number: [id number] Date of Birth/Sex: Treating RN: [birth year] (  89 y.o. Ginette Pitman Primary Care Kadejah Sandiford: Elizabeth Sauer Other Clinician: Jerzy, Crotteau ([id number]) [phone].pdf Page 7 of 9 Referring Sharese Manrique: Treating Zaidin Blyden/Extender: Althia Forts Weeks in Treatment: 1 Wound Status Wound Number: 3 Primary Pressure Ulcer Etiology: Wound Location: Left Gluteus Wound Open Wounding Event: Pressure Injury Status: Date Acquired: 04/22/2023 Comorbid Arrhythmia, Coronary Artery Disease, Hypertension, Type II Weeks Of Treatment: 1 History: Diabetes, History of pressure wounds, Osteoarthritis Clustered Wound: No Photos Wound Measurements Length: (cm) 0.5 Width: (cm) 0.8 Depth: (cm) 0.2 Area: (cm) 0.314 Volume:  (cm) 0.063 % Reduction in Area: 77.1% % Reduction in Volume: 54% Epithelialization: None Wound Description Classification: Category/Stage III Exudate Amount: Medium Exudate Type: Serosanguineous Exudate Color: red, brown Foul Odor After Cleansing: No Slough/Fibrino Yes Wound Bed Granulation Amount: Large (67-100%) Exposed Structure Granulation Quality: Red, Pink Fat Layer (Subcutaneous Tissue) Exposed: Yes Necrotic Amount: Small (1-33%) Necrotic Quality: Adherent Scientist, physiological) Signed: 06/05/2023 4:39:16 PM By: Midge Aver MSN RN CNS WTA Entered By: Midge Aver on 06/02/2023 11:39:08 -------------------------------------------------------------------------------- Wound Assessment Details Patient Name: Date of Service: Jodell Cipro, [name] 06/02/2023 11:15 A M Medical Record Number: [id number] Patient Account Number: [id number] Date of Birth/Sex: Treating RN: 1932/05/07 (89 y.o. Ginette Pitman Primary Care Jakiah Goree: Elizabeth Sauer Other Clinician: Betha Loa Referring Allana Shrestha: Treating Broden Holt/Extender: Althia Forts Weeks in Treatment: 1 Wound Status Wound Number: 4 Primary Pressure Ulcer Etiology: ABIOLA, BEHRING ([id number]) [id number] Page 8 of 9 Etiology: Wound Location: Right Gluteus Wound Open Wounding Event: Pressure Injury Status: Date Acquired: 04/22/2023 Comorbid Arrhythmia, Coronary Artery Disease, Hypertension, Type II Weeks Of Treatment: 1 History: Diabetes, History of pressure wounds, Osteoarthritis Clustered Wound: No Photos Wound Measurements Length: (cm) 3 Width: (cm) 1 Depth: (cm) 0.1 Area: (cm) 2.356 Volume: (cm) 0.236 % Reduction in Area: -200.1% % Reduction in Volume: -198.7% Epithelialization: None Wound Description Classification: Category/Stage II Exudate Amount: Medium Exudate Type: Serosanguineous Exudate Color: red, brown Foul Odor After Cleansing: No Slough/Fibrino  Yes Wound Bed Granulation Amount: Large (67-100%) Granulation Quality: Red, Pink Necrotic Amount: None Present (0%) Electronic Signature(s) Signed: 06/05/2023 4:39:16 PM By: Midge Aver MSN RN CNS WTA Entered By: Midge Aver on 06/02/2023 11:39:31 -------------------------------------------------------------------------------- Vitals Details Patient Name: Date of Service: Jodell Cipro, [name] 06/02/2023 11:15 A M Medical Record Number: [id number] Patient Account Number: [id number] Date of Birth/Sex: Treating RN: 07-Jan-1932 (89 y.o. Ginette Pitman Primary Care Glenn Christo: Elizabeth Sauer Other Clinician: Betha Loa Referring Juelle Dickmann: Treating Lumi Winslett/Extender: Althia Forts Weeks in Treatment: 1 Vital Signs Time Taken: 11:29 Temperature (F): 98.0 Height (in): 63 Pulse (bpm): 53 Weight (lbs): 163 Respiratory Rate (breaths/min): 18 Body Mass Index (BMI): 28.9 Blood Pressure (mmHg): 146/51 Reference Range: 80 - 120 mg / dl QUINNETTA, ROEPKE ([id number]) [id number] Page 9 of 9 Electronic Signature(s) Signed: 06/05/2023 4:39:16 PM By: Midge Aver MSN RN CNS WTA Entered By: Midge Aver on 06/02/2023 11:38:42

## 2023-06-07 DIAGNOSIS — I119 Hypertensive heart disease without heart failure: Secondary | ICD-10-CM | POA: Diagnosis not present

## 2023-06-07 DIAGNOSIS — I872 Venous insufficiency (chronic) (peripheral): Secondary | ICD-10-CM | POA: Diagnosis not present

## 2023-06-07 DIAGNOSIS — E119 Type 2 diabetes mellitus without complications: Secondary | ICD-10-CM | POA: Diagnosis not present

## 2023-06-07 DIAGNOSIS — G894 Chronic pain syndrome: Secondary | ICD-10-CM | POA: Diagnosis not present

## 2023-06-07 DIAGNOSIS — L89322 Pressure ulcer of left buttock, stage 2: Secondary | ICD-10-CM | POA: Diagnosis not present

## 2023-06-07 DIAGNOSIS — I350 Nonrheumatic aortic (valve) stenosis: Secondary | ICD-10-CM | POA: Diagnosis not present

## 2023-06-09 ENCOUNTER — Encounter: Payer: Medicare Other | Admitting: Physician Assistant

## 2023-06-09 DIAGNOSIS — I10 Essential (primary) hypertension: Secondary | ICD-10-CM | POA: Diagnosis not present

## 2023-06-09 DIAGNOSIS — L89322 Pressure ulcer of left buttock, stage 2: Secondary | ICD-10-CM | POA: Diagnosis not present

## 2023-06-09 DIAGNOSIS — G894 Chronic pain syndrome: Secondary | ICD-10-CM | POA: Diagnosis not present

## 2023-06-09 DIAGNOSIS — I2510 Atherosclerotic heart disease of native coronary artery without angina pectoris: Secondary | ICD-10-CM | POA: Diagnosis not present

## 2023-06-09 DIAGNOSIS — I872 Venous insufficiency (chronic) (peripheral): Secondary | ICD-10-CM | POA: Diagnosis not present

## 2023-06-09 DIAGNOSIS — E11622 Type 2 diabetes mellitus with other skin ulcer: Secondary | ICD-10-CM | POA: Diagnosis not present

## 2023-06-09 DIAGNOSIS — I350 Nonrheumatic aortic (valve) stenosis: Secondary | ICD-10-CM | POA: Diagnosis not present

## 2023-06-09 DIAGNOSIS — L89312 Pressure ulcer of right buttock, stage 2: Secondary | ICD-10-CM | POA: Diagnosis not present

## 2023-06-09 DIAGNOSIS — L89323 Pressure ulcer of left buttock, stage 3: Secondary | ICD-10-CM | POA: Diagnosis not present

## 2023-06-09 DIAGNOSIS — E1151 Type 2 diabetes mellitus with diabetic peripheral angiopathy without gangrene: Secondary | ICD-10-CM | POA: Diagnosis not present

## 2023-06-09 DIAGNOSIS — M1990 Unspecified osteoarthritis, unspecified site: Secondary | ICD-10-CM | POA: Diagnosis not present

## 2023-06-09 DIAGNOSIS — E119 Type 2 diabetes mellitus without complications: Secondary | ICD-10-CM | POA: Diagnosis not present

## 2023-06-09 DIAGNOSIS — I119 Hypertensive heart disease without heart failure: Secondary | ICD-10-CM | POA: Diagnosis not present

## 2023-06-12 ENCOUNTER — Emergency Department: Payer: Medicare Other

## 2023-06-12 ENCOUNTER — Observation Stay
Admission: EM | Admit: 2023-06-12 | Discharge: 2023-06-15 | Disposition: A | Discharge status: Home Health | Payer: Medicare Other | Attending: Internal Medicine | Admitting: Internal Medicine

## 2023-06-12 ENCOUNTER — Other Ambulatory Visit: Payer: Self-pay

## 2023-06-12 ENCOUNTER — Ambulatory Visit: Payer: Self-pay

## 2023-06-12 ENCOUNTER — Telehealth: Payer: Self-pay | Admitting: Family Medicine

## 2023-06-12 DIAGNOSIS — Z96653 Presence of artificial knee joint, bilateral: Secondary | ICD-10-CM | POA: Insufficient documentation

## 2023-06-12 DIAGNOSIS — R001 Bradycardia, unspecified: Secondary | ICD-10-CM | POA: Diagnosis not present

## 2023-06-12 DIAGNOSIS — R55 Syncope and collapse: Secondary | ICD-10-CM | POA: Diagnosis not present

## 2023-06-12 DIAGNOSIS — R519 Headache, unspecified: Secondary | ICD-10-CM

## 2023-06-12 DIAGNOSIS — R42 Dizziness and giddiness: Principal | ICD-10-CM

## 2023-06-12 DIAGNOSIS — I872 Venous insufficiency (chronic) (peripheral): Secondary | ICD-10-CM | POA: Diagnosis not present

## 2023-06-12 DIAGNOSIS — Z8679 Personal history of other diseases of the circulatory system: Secondary | ICD-10-CM

## 2023-06-12 DIAGNOSIS — Z79899 Other long term (current) drug therapy: Secondary | ICD-10-CM | POA: Diagnosis not present

## 2023-06-12 DIAGNOSIS — Z7984 Long term (current) use of oral hypoglycemic drugs: Secondary | ICD-10-CM | POA: Insufficient documentation

## 2023-06-12 DIAGNOSIS — I160 Hypertensive urgency: Secondary | ICD-10-CM

## 2023-06-12 DIAGNOSIS — I10 Essential (primary) hypertension: Secondary | ICD-10-CM | POA: Diagnosis present

## 2023-06-12 DIAGNOSIS — L89322 Pressure ulcer of left buttock, stage 2: Secondary | ICD-10-CM | POA: Diagnosis not present

## 2023-06-12 DIAGNOSIS — I119 Hypertensive heart disease without heart failure: Secondary | ICD-10-CM | POA: Diagnosis not present

## 2023-06-12 DIAGNOSIS — I350 Nonrheumatic aortic (valve) stenosis: Secondary | ICD-10-CM | POA: Diagnosis not present

## 2023-06-12 DIAGNOSIS — G894 Chronic pain syndrome: Secondary | ICD-10-CM | POA: Diagnosis not present

## 2023-06-12 DIAGNOSIS — E119 Type 2 diabetes mellitus without complications: Secondary | ICD-10-CM | POA: Diagnosis not present

## 2023-06-12 LAB — CBC
HCT: 30 % — ABNORMAL LOW (ref 36.0–46.0)
HCT: 32.1 % — ABNORMAL LOW (ref 36.0–46.0)
Hemoglobin: 10.1 g/dL — ABNORMAL LOW (ref 12.0–15.0)
Hemoglobin: 9.4 g/dL — ABNORMAL LOW (ref 12.0–15.0)
MCH: 25.6 pg — ABNORMAL LOW (ref 26.0–34.0)
MCH: 26 pg (ref 26.0–34.0)
MCHC: 31.3 g/dL (ref 30.0–36.0)
MCHC: 31.5 g/dL (ref 30.0–36.0)
MCV: 81.7 fL (ref 80.0–100.0)
MCV: 82.5 fL (ref 80.0–100.0)
Platelets: 299 10*3/uL (ref 150–400)
Platelets: 349 K/uL (ref 150–400)
RBC: 3.67 MIL/uL — ABNORMAL LOW (ref 3.87–5.11)
RBC: 3.89 MIL/uL (ref 3.87–5.11)
RDW: 16.9 % — ABNORMAL HIGH (ref 11.5–15.5)
RDW: 17.1 % — ABNORMAL HIGH (ref 11.5–15.5)
WBC: 5.9 K/uL (ref 4.0–10.5)
WBC: 6.2 10*3/uL (ref 4.0–10.5)
nRBC: 0 % (ref 0.0–0.2)
nRBC: 0 % (ref 0.0–0.2)

## 2023-06-12 LAB — BASIC METABOLIC PANEL
Anion gap: 8 (ref 5–15)
BUN: 16 mg/dL (ref 8–23)
CO2: 22 mmol/L (ref 22–32)
Calcium: 9.2 mg/dL (ref 8.9–10.3)
Chloride: 106 mmol/L (ref 98–111)
Creatinine, Ser: 0.49 mg/dL (ref 0.44–1.00)
GFR, Estimated: >60 mL/min (ref >60)
Glucose, Bld: 95 mg/dL (ref 70–99)
Potassium: 4.1 mmol/L (ref 3.5–5.1)
Sodium: 136 mmol/L (ref 135–145)

## 2023-06-12 LAB — CBG MONITORING, ED: Glucose-Capillary: 154 mg/dL — ABNORMAL HIGH (ref 70–99)

## 2023-06-12 LAB — MAGNESIUM: Magnesium: 1.7 mg/dL (ref 1.7–2.4)

## 2023-06-12 MED ORDER — ONDANSETRON HCL 4 MG PO TABS: 4.0000 mg | ORAL_TABLET | Freq: Four times a day (QID) | ORAL | Status: DC | PRN

## 2023-06-12 MED ORDER — ACETAMINOPHEN 325 MG PO TABS
650.0000 mg | ORAL_TABLET | Freq: Four times a day (QID) | ORAL | Status: DC | PRN
  Administered 2023-06-13 – 2023-06-14 (×2): 650 mg via ORAL
  Filled 2023-06-12 (×2): qty 2

## 2023-06-12 MED ORDER — ACETAMINOPHEN 325 MG PO TABS
650.0000 mg | ORAL_TABLET | Freq: Once | ORAL | Status: AC
  Administered 2023-06-12: 650 mg via ORAL
  Filled 2023-06-12: qty 2

## 2023-06-12 MED ORDER — ONDANSETRON HCL 4 MG/2ML IJ SOLN: 4.0000 mg | Freq: Four times a day (QID) | INTRAMUSCULAR | Status: DC | PRN

## 2023-06-12 MED ORDER — INSULIN ASPART 100 UNIT/ML IJ SOLN: 0.0000 [IU] | Freq: Every day | INTRAMUSCULAR | Status: DC

## 2023-06-12 MED ORDER — ACETAMINOPHEN 650 MG RE SUPP: 650.0000 mg | Freq: Four times a day (QID) | RECTAL | Status: DC | PRN

## 2023-06-12 MED ORDER — SODIUM CHLORIDE 0.9% FLUSH
3.0000 mL | Freq: Two times a day (BID) | INTRAVENOUS | Status: DC
  Administered 2023-06-13 – 2023-06-15 (×6): 3 mL via INTRAVENOUS

## 2023-06-12 MED ORDER — HYDROCODONE-ACETAMINOPHEN 5-325 MG PO TABS: 1.0000 | ORAL_TABLET | ORAL | Status: DC | PRN

## 2023-06-12 MED ORDER — AMLODIPINE BESYLATE 5 MG PO TABS
5.0000 mg | ORAL_TABLET | Freq: Every day | ORAL | Status: DC
  Administered 2023-06-12: 5 mg via ORAL
  Filled 2023-06-12: qty 1

## 2023-06-12 MED ORDER — INSULIN ASPART 100 UNIT/ML IJ SOLN
0.0000 [IU] | Freq: Three times a day (TID) | INTRAMUSCULAR | Status: DC
  Administered 2023-06-13: 3 [IU] via SUBCUTANEOUS
  Filled 2023-06-12: qty 1

## 2023-06-12 MED ORDER — ENOXAPARIN SODIUM 40 MG/0.4ML IJ SOSY: 40.0000 mg | PREFILLED_SYRINGE | INTRAMUSCULAR | Status: DC

## 2023-06-12 MED ORDER — CLONIDINE HCL 0.1 MG PO TABS
0.1000 mg | ORAL_TABLET | Freq: Once | ORAL | Status: AC
  Administered 2023-06-12: 0.1 mg via ORAL
  Filled 2023-06-12: qty 1

## 2023-06-12 MED ORDER — ENOXAPARIN SODIUM 40 MG/0.4ML IJ SOSY
40.0000 mg | PREFILLED_SYRINGE | INTRAMUSCULAR | Status: DC
  Administered 2023-06-13: 40 mg via SUBCUTANEOUS
  Filled 2023-06-12: qty 0.4

## 2023-06-12 NOTE — Assessment & Plan Note (Signed)
Suspect related to elevated blood pressures CT head was nonacute and patient has no focal deficits Improved with Tylenol

## 2023-06-12 NOTE — ED Triage Notes (Signed)
Pt to ED for evaluation of bradycardia. Was doing PT and was told HR in the 40's. Pt only complaint mild headache.

## 2023-06-12 NOTE — ED Provider Notes (Signed)
[facility] Provider Note    Event Date/Time   First MD Initiated Contact with Patient 06/12/23 1706     (approximate)   History   Bradycardia   HPI  [name] is a 89 y.o. female with history of diabetes, hypertension, SVT presenting to the emergency department for evaluation of low heart rate.  Earlier today, patient was evaluated by physical therapy.  They did obtain vitals and note that patient had heart rate ranging from 41-44.  It was recommended that she contact her primary care doctor.  Her primary care doctor did not have an appointment today, so they were directed to the ER for further evaluation.  Patient does report a headache, not sudden in onset.  Later reports that this feels more like lightheadedness, denies syncope.  Unsure if she is experienced this before.    Physical Exam   Triage Vital Signs: ED Triage Vitals  Encounter Vitals Group     BP 06/12/23 1529 (!) 147/68     Systolic BP Percentile --      Diastolic BP Percentile --      Pulse Rate 06/12/23 1529 (!) 54     Resp 06/12/23 1529 18     Temp 06/12/23 1529 98.6 F (37 C)     Temp src --      SpO2 06/12/23 1529 97 %     Weight 06/12/23 1531 163 lb (73.9 kg)     Height 06/12/23 1531 5\' 3"  (1.6 m)     Head Circumference --      Peak Flow --      Pain Score 06/12/23 1530 2     Pain Loc --      Pain Education --      Exclude from Growth Chart --     Most recent vital signs: Vitals:   06/12/23 2045 06/12/23 2100  BP: (!) 167/70 (!) 161/70  Pulse: (!) 51 (!) 56  Resp: 20 13  Temp:    SpO2: 97% 100%     General: Awake, interactive  CV:  Bradycardic with regular rhythm, good peripheral perfusion Resp:  Lungs clear, unlabored respirations.  Abd:  Soft, nondistended.  Neuro:  Symmetric facial movement, fluid speech, symmetric weakness of the bilateral upper and lower extremities that family reports is similar to her recent baseline, ambulates with walker at  baseline   ED Results / Procedures / Treatments   Labs (all labs ordered are listed, but only abnormal results are displayed) Labs Reviewed  CBC - Abnormal; Notable for the following components:      Result Value   Hemoglobin 10.1 (*)    HCT 32.1 (*)    RDW 16.9 (*)    All other components within normal limits  BASIC METABOLIC PANEL  MAGNESIUM  CBC  CREATININE, SERUM     EKG EKG independently reviewed interpreted by myself (ER attending) demonstrates:  EKG from 1536 demonstrates sinus bradycardia at a rate of 53, PR 204, QRS 120, QTc 424, no acute ST changes  EKG from 1741 demonstrates sinus rhythm at a rate of 55, PR 207, QRS 127, QTc 431, no acute ST changes   RADIOLOGY Imaging independently reviewed and interpreted by myself demonstrates:  CT head without acute bleed  PROCEDURES:  Critical Care performed: No  Procedures   MEDICATIONS ORDERED IN ED: Medications  amLODipine (NORVASC) tablet 5 mg (has no administration in time range)  sodium chloride flush (NS) 0.9 % injection 3 mL (has no  administration in time range)  insulin aspart (novoLOG) injection 0-15 Units (has no administration in time range)  insulin aspart (novoLOG) injection 0-5 Units (has no administration in time range)  acetaminophen (TYLENOL) tablet 650 mg (has no administration in time range)    Or  acetaminophen (TYLENOL) suppository 650 mg (has no administration in time range)  ondansetron (ZOFRAN) tablet 4 mg (has no administration in time range)    Or  ondansetron (ZOFRAN) injection 4 mg (has no administration in time range)  HYDROcodone-acetaminophen (NORCO/VICODIN) 5-325 MG per tablet 1-2 tablet (has no administration in time range)  enoxaparin (LOVENOX) injection 40 mg (has no administration in time range)  cloNIDine (CATAPRES) tablet 0.1 mg (0.1 mg Oral Given 06/12/23 2006)  acetaminophen (TYLENOL) tablet 650 mg (650 mg Oral Given 06/12/23 2059)     IMPRESSION / MDM / ASSESSMENT AND  PLAN / ED COURSE  I reviewed the triage vital signs and the nursing notes.  Differential diagnosis includes, but is not limited to, bradycardia secondary to beta-blocker use, intermittent heart block, electrolyte abnormality, acute intracranial bleed, other acute intracranial process, hypertensive emergency presenting as lightheadedness  Patient's presentation is most consistent with acute presentation with potential threat to life or bodily function.  89 year old female presenting to the emergency department for evaluation of bradycardia noted during physical therapy.  Initial description seems more consistent with asymptomatic bradycardia.  Reviewed with Dr. Jens Som with cardiology who did recommend stopping her beta-blocker. While in the ER, patient noted to become progressively more hypertensive.  CT head reassuring.  Labs without severe derangement.  Did discuss with family that treating her blood pressure is more difficult in the setting of her bradycardia as many medications can affect both.  Did trial oral clonidine with some improvement in her blood pressure without significant change in heart rate.  On reevaluation, patient reports ongoing headache and lightheadedness, family very concerned about discharge home.  Do think there is some concern about symptomatic bradycardia versus symptomatic hypertension based on her presentation.  With this, will discuss with hospitalist team.  Case reviewed with Dr. Para March.  She will evaluate the patient for anticipated admission.     FINAL CLINICAL IMPRESSION(S) / ED DIAGNOSES   Final diagnoses:  Sinus bradycardia  Hypertensive urgency     Rx / DC Orders   ED Discharge Orders     None        Note:  This document was prepared using Dragon voice recognition software and may include unintentional dictation errors.   Trinna Post, MD 06/12/23 [phone]

## 2023-06-12 NOTE — Assessment & Plan Note (Addendum)
Symptomatic bradycardia History of paroxysmal SVT Bradycardic to high 40s and 50s in the ED Hold home metoprolol Continuous cardiac monitoring Cardiology consult

## 2023-06-12 NOTE — Telephone Encounter (Signed)
     Chief Complaint: PTA, Jae Dire, with [facility] reports pt.'s HR 44-48. BP 128/60.O2 saturation 99%. Has a headache. Pt. States she feels "ok" Blood glucose 256 after eating Frosted Flakes cereal. PTA has checked HR several times. Symptoms: Headache Frequency: Today Pertinent Negatives: Patient denies chest pain, dizziness Disposition: [x] ED /[] Urgent Care (no appt availability in office) / [] Appointment(In office/virtual)/ []  [facility]/ [] Home Care/ [] Refused Recommended Disposition /[] [facility]/ []  Follow-up with PCP Additional Notes: Due to pt.'s age and consistent HR in 40's  pt. Will go to ED. Reason for Disposition  Heart beating very slowly (e.g., < 50 / minute)  (Exception: Athlete and heart rate normal for caller.)  Answer Assessment - Initial Assessment Questions 1. DESCRIPTION: "Please describe your heart rate or heartbeat that you are having" (e.g., fast/slow, regular/irregular, skipped or extra beats, "palpitations")     Bradycardia 2. ONSET: "When did it start?" (Minutes, hours or days)      Today 3. DURATION: "How long does it last" (e.g., seconds, minutes, hours)     Today 4. PATTERN "Does it come and go, or has it been constant since it started?"  "Does it get worse with exertion?"   "Are you feeling it now?"     Now 5. TAP: "Using your hand, can you tap out what you are feeling on a chair or table in front of you, so that I can hear?" (Note: not all patients can do this)       N/a 6. HEART RATE: "Can you tell me your heart rate?" "How many beats in 15 seconds?"  (Note: not all patients can do this)       44-45     BP 128/60 7. RECURRENT SYMPTOM: "Have you ever had this before?" If Yes, ask: "When was the last time?" and "What happened that time?"      Yes 8. CAUSE: "What do you think is causing the palpitations?"     Unsure 9. CARDIAC HISTORY: "Do you have any history of heart disease?" (e.g., heart attack, angina, bypass surgery,  angioplasty, arrhythmia)      Yes 10. OTHER SYMPTOMS: "Do you have any other symptoms?" (e.g., dizziness, chest pain, sweating, difficulty breathing)       Headache 11. PREGNANCY: "Is there any chance you are pregnant?" "When was your last menstrual period?"       No  Protocols used: Heart Rate and Heartbeat Questions-A-AH

## 2023-06-12 NOTE — H&P (Signed)
History and Physical    Patient: [name] UJW:[id number] DOB: 1932-04-30 DOA: 06/12/2023 DOS: the patient was seen and examined on 06/12/2023 PCP: Duanne Limerick, MD  Patient coming from: Home  Chief Complaint:  Chief Complaint  Patient presents with   Bradycardia    HPI: [name] is a 89 y.o. female with medical history significant for hypertension, GERD, diabetes, SVT on heart monitoring June 2023 to maximal heart rate to 18 bpm, seen by cardiologist, Dr. Lalla Brothers at the time who presents to the ED, with dizziness and headache that started early in the morning, subsequently found to be bradycardic to the 40s while working out with PT, thus prompting the visit to the emergency department.  Patient has been treated with metoprolol tartrate 25 mg p.o. twice daily for history of SVT/PAC/PVC on her heart monitor a year ago.  She denies chest pain, shortness of breath or one-sided weakness numbness or tingling. ED course and data review: Heart rate in the 50s ranging from 51-57 and hypertensive with SBP as high as 208/82.  Other vitals within normal limits. Labs CBC, CMP within normal limits except for mild anemia of 10.1.  Magnesium 1.7, potassium 4.1. EKG, personally viewed and interpreted showing sinus rhythm at 55 with LBBB CT head nonacute showing atrophy and small vessel disease Patient was treated with acetaminophen for headache and clonidine 0.1 mg with improvement in BP to 161/70. Observation requested   Review of Systems: As mentioned in the history of present illness. All other systems reviewed and are negative.  Past Medical History:  Diagnosis Date   GERD (gastroesophageal reflux disease)    Hypertension    Right knee DJD    Type II diabetes mellitus (HCC)    Past Surgical History:  Procedure Laterality Date   BACK SURGERY     CATARACT EXTRACTION Bilateral 03/2016   CERVICAL DISC SURGERY  12/28/2010   "bulging disc; went in on the left side of my neck"  (08/19/2013)   JOINT REPLACEMENT     TOTAL KNEE ARTHROPLASTY Left 03/28/2011   Dr Thurston Hole   TOTAL KNEE ARTHROPLASTY Right 08/19/2013   TOTAL KNEE ARTHROPLASTY Right 08/19/2013   Procedure: TOTAL KNEE ARTHROPLASTY;  Surgeon: Nilda Simmer, MD;  Location: [facility];  Service: Orthopedics;  Laterality: Right;   Social History:  reports that she has never smoked. She has never used smokeless tobacco. She reports that she does not currently use alcohol. She reports that she does not use drugs.  Allergies  Allergen Reactions   Nsaids    Lisinopril     Cough    Family History  Problem Relation Age of Onset   Diabetes Mother    Cerebral aneurysm Father     Prior to Admission medications   Medication Sig Start Date End Date Taking? Authorizing Provider  cyanocobalamin 100 MCG tablet Take 100 mcg by mouth daily.   Yes [provider]  fluocinonide (LIDEX) 0.05 % external solution Apply 1 Application topically daily.   Yes [provider]  furosemide (LASIX) 20 MG tablet Take 1 tablet (20 mg total) by mouth daily. Decrease dosing to three times a week Patient taking differently: Take 20 mg by mouth daily. 09/16/22  Yes Duanne Limerick, MD  glipiZIDE (GLUCOTROL XL) 2.5 MG 24 hr tablet Take 1 tablet (2.5 mg total) by mouth daily with breakfast. 05/09/23  Yes Elizabeth Sauer C, MD  glucose blood (ONE TOUCH ULTRA TEST) test strip USE 1 STRIP TO CHECK GLUCOSE  ONCE DAILY 05/17/19  Yes Duanne Limerick, MD  latanoprost (XALATAN) 0.005 % ophthalmic solution Place 1 drop into both eyes at bedtime.   Yes [provider]  loratadine (CLARITIN) 10 MG tablet Take 1 tablet (10 mg total) by mouth daily. 01/06/23  Yes Duanne Limerick, MD  metFORMIN (GLUCOPHAGE) 500 MG tablet Take 1 tablet (500 mg total) by mouth 2 (two) times daily with a meal. 05/09/23  Yes Duanne Limerick, MD  metoprolol tartrate (LOPRESSOR) 25 MG tablet Take 1 tablet (25 mg total) by mouth 2 (two) times daily. 09/07/22  Yes  Lanier Prude, MD  mometasone (NASONEX) 50 MCG/ACT nasal spray Use 2 spray(s) in each nostril once daily 01/06/23  Yes Duanne Limerick, MD  montelukast (SINGULAIR) 10 MG tablet Take 1 tablet (10 mg total) by mouth at bedtime. 01/06/23  Yes Duanne Limerick, MD  omeprazole (PRILOSEC) 20 MG capsule Take 1 capsule (20 mg total) by mouth daily. 09/16/22  Yes Duanne Limerick, MD  docusate sodium (COLACE) 50 MG capsule Take 1 capsule (50 mg total) by mouth 2 (two) times daily. 01/17/19   Duanne Limerick, MD  triamcinolone cream (KENALOG) 0.1 % Apply 1 Application topically 2 (two) times daily. Patient not taking: Reported on 06/12/2023 11/28/22   Duanne Limerick, MD    Physical Exam: Vitals:   06/12/23 1845 06/12/23 1915 06/12/23 2045 06/12/23 2100  BP: (!) 205/83 (!) 208/82 (!) 167/70 (!) 161/70  Pulse: (!) 53 (!) 56 (!) 51 (!) 56  Resp: (!) 21 19 20 13   Temp:      SpO2: 100% 100% 97% 100%  Weight:      Height:       Physical Exam  Labs on Admission: I have personally reviewed following labs and imaging studies  CBC: Recent Labs  Lab 06/12/23 1540  WBC 5.9  HGB 10.1*  HCT 32.1*  MCV 82.5  PLT 349   Basic Metabolic Panel: Recent Labs  Lab 06/12/23 1540  NA 136  K 4.1  CL 106  CO2 22  GLUCOSE 95  BUN 16  CREATININE 0.49  CALCIUM 9.2  MG 1.7   GFR: Estimated Creatinine Clearance: 45.9 mL/min (by C-G formula based on SCr of 0.49 mg/dL). Liver Function Tests: No results for input(s): "AST", "ALT", "ALKPHOS", "BILITOT", "PROT", "ALBUMIN" in the last 168 hours. No results for input(s): "LIPASE", "AMYLASE" in the last 168 hours. No results for input(s): "AMMONIA" in the last 168 hours. Coagulation Profile: No results for input(s): "INR", "PROTIME" in the last 168 hours. Cardiac Enzymes: No results for input(s): "CKTOTAL", "CKMB", "CKMBINDEX", "TROPONINI" in the last 168 hours. BNP (last 3 results) No results for input(s): "PROBNP" in the last 8760 hours. HbA1C: No  results for input(s): "HGBA1C" in the last 72 hours. CBG: No results for input(s): "GLUCAP" in the last 168 hours. Lipid Profile: No results for input(s): "CHOL", "HDL", "LDLCALC", "TRIG", "CHOLHDL", "LDLDIRECT" in the last 72 hours. Thyroid Function Tests: No results for input(s): "TSH", "T4TOTAL", "FREET4", "T3FREE", "THYROIDAB" in the last 72 hours. Anemia Panel: No results for input(s): "VITAMINB12", "FOLATE", "FERRITIN", "TIBC", "IRON", "RETICCTPCT" in the last 72 hours. Urine analysis:    Component Value Date/Time   COLORURINE STRAW (A) 04/12/2021 2141   APPEARANCEUR CLEAR (A) 04/12/2021 2141   LABSPEC 1.010 04/12/2021 2141   PHURINE 6.0 04/12/2021 2141   GLUCOSEU NEGATIVE 04/12/2021 2141   HGBUR NEGATIVE 04/12/2021 2141   BILIRUBINUR NEGATIVE 04/12/2021 2141   KETONESUR NEGATIVE  04/12/2021 2141   PROTEINUR NEGATIVE 04/12/2021 2141   UROBILINOGEN 0.2 08/19/2013 1134   NITRITE NEGATIVE 04/12/2021 2141   LEUKOCYTESUR NEGATIVE 04/12/2021 2141    Radiological Exams on Admission: CT Head Wo Contrast  Result Date: 06/12/2023 CLINICAL DATA:  Bradycardia, headaches EXAM: CT HEAD WITHOUT CONTRAST TECHNIQUE: Contiguous axial images were obtained from the base of the skull through the vertex without intravenous contrast. RADIATION DOSE REDUCTION: This exam was performed according to the departmental dose-optimization program which includes automated exposure control, adjustment of the mA and/or kV according to patient size and/or use of iterative reconstruction technique. COMPARISON:  12/27/2010 FINDINGS: Brain: No acute intracranial findings are seen. There are no signs of bleeding within the cranium. Cortical sulci are prominent. There is decreased density in periventricular white matter. Vascular: Unremarkable. Skull: No acute findings are seen. Sinuses/Orbits: Unremarkable. Other: There is increased amount of CSF instead of suggesting partial empty sella. IMPRESSION: No acute intracranial  findings are seen in noncontrast CT brain. Atrophy. Small vessel disease. Electronically Signed   By: Ernie Avena M.D.   On: 06/12/2023 20:48     Data Reviewed: Relevant notes from primary care and specialist visits, past discharge summaries as available in EHR, including Care Everywhere. Prior diagnostic testing as pertinent to current admission diagnoses Updated medications and problem lists for reconciliation ED course, including vitals, labs, imaging, treatment and response to treatment Triage notes, nursing and pharmacy notes and ED provider's notes Notable results as noted in HPI   Assessment and Plan: * Postural dizziness with presyncope Symptomatic bradycardia History of paroxysmal SVT Bradycardic to high 40s and 50s in the ED Hold home metoprolol Continuous cardiac monitoring Cardiology consult  Hypertensive urgency BP as high as 208/82 in the ED improved with clonidine to 161/70 Holding home metoprolol due to bradycardia Will start on amlodipine 5 mg daily.  Patient is allergic to ACE inhibitors  Headache Suspect related to elevated blood pressures CT head was nonacute and patient has no focal deficits Improved with Tylenol  Diabetes mellitus with no complication (HCC) Sliding scale insulin coverage Hold home glipizide and metformin for now      DVT prophylaxis: Lovenox  Consults: [facility] cardiology  Advance Care Planning:   Code Status: Prior   Family Communication: none  Disposition Plan: Back to previous home environment  Severity of Illness: The appropriate patient status for this patient is OBSERVATION. Observation status is judged to be reasonable and necessary in order to provide the required intensity of service to ensure the patient's safety. The patient's presenting symptoms, physical exam findings, and initial radiographic and laboratory data in the context of their medical condition is felt to place them at decreased risk for further  clinical deterioration. Furthermore, it is anticipated that the patient will be medically stable for discharge from the hospital within 2 midnights of admission.   Author: Andris Baumann, MD 06/12/2023 10:44 PM  For on call review [email or web address].

## 2023-06-12 NOTE — Progress Notes (Addendum)
0 Small Wound Dressing one or multiple wounds []  - 0 Medium Wound Dressing one or multiple wounds []  - 0 Large Wound Dressing one or multiple wounds []  - 0 Application of Medications - topical []  - 0 Application of Medications - injection INTERVENTIONS - Miscellaneous []  - 0 External ear exam []  - 0 Specimen Collection (cultures, biopsies, blood, body fluids, etc.) []  - 0 Specimen(s) / Culture(s) sent or taken to Lab for analysis []  - 0 Patient Transfer (multiple staff / Teddy Spike / Similar devices) MEKENZIE, ARMENTROUT ([id number]) [id number] Page 3 of 8 []  - 0 Simple Staple / Suture removal (25 or less) []  - 0 Complex Staple / Suture removal (26 or more) []  - 0 Hypo / Hyperglycemic Management (close monitor of Blood Glucose) []  - 0 Ankle / Brachial Index (ABI) - do not check if billed separately X- 1 5 Vital Signs Has the patient been seen at the hospital within the last three years: Yes Total Score: 90 Level Of Care: New/Established - Level 3 Electronic Signature(s) Signed: 06/13/2023 8:06:11 AM By: Elliot Gurney, BSN, RN, CWS, Kim RN, BSN Entered By: Elliot Gurney, BSN, RN, CWS, Kim on 06/12/2023 08:01:01 -------------------------------------------------------------------------------- Encounter Discharge Information Details Patient Name: Date of Service: [name] 06/09/2023 12:00 PM Medical Record Number: [id number] Patient Account Number: [id number] Date of Birth/Sex: Treating RN: 1932-02-20 (89 y.o. [name] Primary Care Sravya Grissom: Elizabeth Sauer Other Clinician: Betha Loa Referring Moorea Boissonneault: Treating Vonna Brabson/Extender: Althia Forts Weeks in Treatment: 2 Encounter Discharge Information Items Discharge Condition: Stable Ambulatory Status: Ambulatory Discharge Destination:  Home Transportation: Private Auto Accompanied By: self Schedule Follow-up Appointment: No Clinical Summary of Care: Electronic Signature(s) Signed: 06/12/2023 11:03:17 AM By: Elliot Gurney, BSN, RN, CWS, Kim RN, BSN Entered By: Elliot Gurney, BSN, RN, CWS, Kim on 06/12/2023 08:03:17 -------------------------------------------------------------------------------- Lower Extremity Assessment Details Patient Name: Date of Service: [facility], [name] 06/09/2023 12:00 PM Medical Record Number: [id number] Patient Account Number: [id number] Date of Birth/Sex: Treating RN: Sep 20, 1932 (89 y.o. [name] Primary Care Daven Montz: Elizabeth Sauer Other Clinician: Betha Loa Referring Quantavis Obryant: Treating Rilie Glanz/Extender: Althia Forts Weeks in Treatment: 2 JULL, ELIAS ([id number]) [id number] Page 4 of 8 Electronic Signature(s) Signed: 06/12/2023 10:59:38 AM By: Elliot Gurney, BSN, RN, CWS, Kim RN, BSN Entered By: Elliot Gurney, BSN, RN, CWS, Kim on 06/12/2023 07:59:38 -------------------------------------------------------------------------------- Multi Wound Chart Details Patient Name: Date of Service: [name], [name] 06/09/2023 12:00 PM Medical Record Number: [id number] Patient Account Number: [id number] Date of Birth/Sex: Treating RN: [birth year] (89 y.o. Cathlean Cower, Kim Primary Care Flonnie Wierman: Elizabeth Sauer Other Clinician: Betha Loa Referring Aviendha Azbell: Treating Eldor Conaway/Extender: Althia Forts Weeks in Treatment: 2 Vital Signs Height(in): 63 Pulse(bpm): 60 Weight(lbs): 163 Blood Pressure(mmHg): 146/60 Body Mass Index(BMI): 28.9 Temperature(F): 98 Respiratory Rate(breaths/min): 16 [3:Photos: No Photos Left Gluteus Wound Location: Pressure Injury Wounding Event: Pressure Ulcer Primary Etiology: 04/22/2023 Date Acquired: 2 Weeks of Treatment: Healed - Epithelialized Wound Status: No Wound Recurrence: 0x0x0 Measurements L x W x D (cm) 0 A (cm) :  rea 0  Volume (cm) : 100.00% % Reduction in A rea: 100.00% % Reduction in Volume: Category/Stage III Classification: Medium Exudate A mount: Serosanguineous Exudate Type: red, brown Exudate Color:] [4:No Photos Right Gluteus Pressure Injury Pressure Ulcer  04/22/2023 2 Healed - Epithelialized No 0x0x0 0 0 100.00% 100.00% Category/Stage II Medium Serosanguineous red, brown] [N/A:N/A N/A N/A N/A N/A N/A N/A N/A N/A N/A N/A N/A N/A N/A N/A N/A N/A] Treatment Notes Electronic  ANNALYN, BINZ ([id number]) [id number] Page 1 of 8 Visit Report for 06/09/2023 Arrival Information Details Patient Name: Date of Service: [place], [place] [name] 06/09/2023 12:00 PM Medical Record Number: [id number] Patient Account Number: [id number] Date of Birth/Sex: Treating RN: 1932-07-04 (89 y.o. [name] Primary Care Robie Oats: Elizabeth Sauer Other Clinician: Betha Loa Referring Kalla Watson: Treating Kei Mcelhiney/Extender: Althia Forts Weeks in Treatment: 2 Visit Information History Since Last Visit Has Dressing in Place as Prescribed: Yes Patient Arrived: Ambulatory Pain Present Now: No Arrival Time: 12:00 Transfer Assistance: None Patient Identification Verified: Yes Secondary Verification Process Completed: Yes Patient Has Alerts: Yes Patient Alerts: type 2 diabetic Electronic Signature(s) Signed: 06/12/2023 10:57:59 AM By: Elliot Gurney, BSN, RN, CWS, Kim RN, BSN Entered By: Elliot Gurney, BSN, RN, CWS, Kim on 06/12/2023 07:57:59 -------------------------------------------------------------------------------- Clinic Level of Care Assessment Details Patient Name: Date of Service: [name], [name] 06/09/2023 12:00 PM Medical Record Number: [id number] Patient Account Number: [id number] Date of Birth/Sex: Treating RN: [birth year] (89 y.o. [name] Primary Care Sharon Stapel: Elizabeth Sauer Other Clinician: Betha Loa Referring Bree Heinzelman: Treating Perle Brickhouse/Extender: Althia Forts Weeks in Treatment: 2 Clinic Level of Care Assessment Items TOOL 4 Quantity Score []  - 0 Use when only an EandM is performed on FOLLOW-UP visit ASSESSMENTS - Nursing Assessment / Reassessment X- 1 10 Reassessment of Co-morbidities (includes updates in patient status) X- 1 5 Reassessment of Adherence to Treatment Plan ASSESSMENTS - Wound and Skin A ssessment / Reassessment []  - 0 Simple Wound Assessment / Reassessment - one wound X- 2 5 Complex Wound  Assessment / Reassessment - multiple wounds GLORENE, LEAZENBY ([id number]) [id number] Page 2 of 8 []  - 0 Dermatologic / Skin Assessment (not related to wound area) ASSESSMENTS - Focused Assessment []  - 0 Circumferential Edema Measurements - multi extremities []  - 0 Nutritional Assessment / Counseling / Intervention []  - 0 Lower Extremity Assessment (monofilament, tuning fork, pulses) []  - 0 Peripheral Arterial Disease Assessment (using hand held doppler) ASSESSMENTS - Ostomy and/or Continence Assessment and Care []  - 0 Incontinence Assessment and Management []  - 0 Ostomy Care Assessment and Management (repouching, etc.) PROCESS - Coordination of Care X - Simple Patient / Family Education for ongoing care 1 15 []  - 0 Complex (extensive) Patient / Family Education for ongoing care X- 1 10 Staff obtains Chiropractor, Records, T Results / Process Orders est []  - 0 Staff telephones HHA, Nursing Homes / Clarify orders / etc []  - 0 Routine Transfer to another Facility (non-emergent condition) []  - 0 Routine Hospital Admission (non-emergent condition) []  - 0 New Admissions / Manufacturing engineer / Ordering NPWT Apligraf, etc. , []  - 0 Emergency Hospital Admission (emergent condition) X- 1 10 Simple Discharge Coordination []  - 0 Complex (extensive) Discharge Coordination PROCESS - Special Needs []  - 0 Pediatric / Minor Patient Management []  - 0 Isolation Patient Management []  - 0 Hearing / Language / Visual special needs []  - 0 Assessment of Community assistance (transportation, D/C planning, etc.) []  - 0 Additional assistance / Altered mentation []  - 0 Support Surface(s) Assessment (bed, cushion, seat, etc.) INTERVENTIONS - Wound Cleansing / Measurement []  - 0 Simple Wound Cleansing - one wound X- 2 5 Complex Wound Cleansing - multiple wounds X- 1 5 Wound Imaging (photographs - any number of wounds) []  - 0 Wound Tracing (instead of  photographs) []  - 0 Simple Wound Measurement - one wound X- 2 5 Complex Wound Measurement - multiple wounds INTERVENTIONS - Wound Dressings []  -  ANNALYN, BINZ ([id number]) [id number] Page 1 of 8 Visit Report for 06/09/2023 Arrival Information Details Patient Name: Date of Service: [place], [place] [name] 06/09/2023 12:00 PM Medical Record Number: [id number] Patient Account Number: [id number] Date of Birth/Sex: Treating RN: 1932-07-04 (89 y.o. [name] Primary Care Robie Oats: Elizabeth Sauer Other Clinician: Betha Loa Referring Kalla Watson: Treating Kei Mcelhiney/Extender: Althia Forts Weeks in Treatment: 2 Visit Information History Since Last Visit Has Dressing in Place as Prescribed: Yes Patient Arrived: Ambulatory Pain Present Now: No Arrival Time: 12:00 Transfer Assistance: None Patient Identification Verified: Yes Secondary Verification Process Completed: Yes Patient Has Alerts: Yes Patient Alerts: type 2 diabetic Electronic Signature(s) Signed: 06/12/2023 10:57:59 AM By: Elliot Gurney, BSN, RN, CWS, Kim RN, BSN Entered By: Elliot Gurney, BSN, RN, CWS, Kim on 06/12/2023 07:57:59 -------------------------------------------------------------------------------- Clinic Level of Care Assessment Details Patient Name: Date of Service: [name], [name] 06/09/2023 12:00 PM Medical Record Number: [id number] Patient Account Number: [id number] Date of Birth/Sex: Treating RN: [birth year] (89 y.o. [name] Primary Care Sharon Stapel: Elizabeth Sauer Other Clinician: Betha Loa Referring Bree Heinzelman: Treating Perle Brickhouse/Extender: Althia Forts Weeks in Treatment: 2 Clinic Level of Care Assessment Items TOOL 4 Quantity Score []  - 0 Use when only an EandM is performed on FOLLOW-UP visit ASSESSMENTS - Nursing Assessment / Reassessment X- 1 10 Reassessment of Co-morbidities (includes updates in patient status) X- 1 5 Reassessment of Adherence to Treatment Plan ASSESSMENTS - Wound and Skin A ssessment / Reassessment []  - 0 Simple Wound Assessment / Reassessment - one wound X- 2 5 Complex Wound  Assessment / Reassessment - multiple wounds GLORENE, LEAZENBY ([id number]) [id number] Page 2 of 8 []  - 0 Dermatologic / Skin Assessment (not related to wound area) ASSESSMENTS - Focused Assessment []  - 0 Circumferential Edema Measurements - multi extremities []  - 0 Nutritional Assessment / Counseling / Intervention []  - 0 Lower Extremity Assessment (monofilament, tuning fork, pulses) []  - 0 Peripheral Arterial Disease Assessment (using hand held doppler) ASSESSMENTS - Ostomy and/or Continence Assessment and Care []  - 0 Incontinence Assessment and Management []  - 0 Ostomy Care Assessment and Management (repouching, etc.) PROCESS - Coordination of Care X - Simple Patient / Family Education for ongoing care 1 15 []  - 0 Complex (extensive) Patient / Family Education for ongoing care X- 1 10 Staff obtains Chiropractor, Records, T Results / Process Orders est []  - 0 Staff telephones HHA, Nursing Homes / Clarify orders / etc []  - 0 Routine Transfer to another Facility (non-emergent condition) []  - 0 Routine Hospital Admission (non-emergent condition) []  - 0 New Admissions / Manufacturing engineer / Ordering NPWT Apligraf, etc. , []  - 0 Emergency Hospital Admission (emergent condition) X- 1 10 Simple Discharge Coordination []  - 0 Complex (extensive) Discharge Coordination PROCESS - Special Needs []  - 0 Pediatric / Minor Patient Management []  - 0 Isolation Patient Management []  - 0 Hearing / Language / Visual special needs []  - 0 Assessment of Community assistance (transportation, D/C planning, etc.) []  - 0 Additional assistance / Altered mentation []  - 0 Support Surface(s) Assessment (bed, cushion, seat, etc.) INTERVENTIONS - Wound Cleansing / Measurement []  - 0 Simple Wound Cleansing - one wound X- 2 5 Complex Wound Cleansing - multiple wounds X- 1 5 Wound Imaging (photographs - any number of wounds) []  - 0 Wound Tracing (instead of  photographs) []  - 0 Simple Wound Measurement - one wound X- 2 5 Complex Wound Measurement - multiple wounds INTERVENTIONS - Wound Dressings []  -  ANNALYN, BINZ ([id number]) [id number] Page 1 of 8 Visit Report for 06/09/2023 Arrival Information Details Patient Name: Date of Service: [place], [place] [name] 06/09/2023 12:00 PM Medical Record Number: [id number] Patient Account Number: [id number] Date of Birth/Sex: Treating RN: 1932-07-04 (89 y.o. [name] Primary Care Robie Oats: Elizabeth Sauer Other Clinician: Betha Loa Referring Kalla Watson: Treating Kei Mcelhiney/Extender: Althia Forts Weeks in Treatment: 2 Visit Information History Since Last Visit Has Dressing in Place as Prescribed: Yes Patient Arrived: Ambulatory Pain Present Now: No Arrival Time: 12:00 Transfer Assistance: None Patient Identification Verified: Yes Secondary Verification Process Completed: Yes Patient Has Alerts: Yes Patient Alerts: type 2 diabetic Electronic Signature(s) Signed: 06/12/2023 10:57:59 AM By: Elliot Gurney, BSN, RN, CWS, Kim RN, BSN Entered By: Elliot Gurney, BSN, RN, CWS, Kim on 06/12/2023 07:57:59 -------------------------------------------------------------------------------- Clinic Level of Care Assessment Details Patient Name: Date of Service: [name], [name] 06/09/2023 12:00 PM Medical Record Number: [id number] Patient Account Number: [id number] Date of Birth/Sex: Treating RN: [birth year] (89 y.o. [name] Primary Care Sharon Stapel: Elizabeth Sauer Other Clinician: Betha Loa Referring Bree Heinzelman: Treating Perle Brickhouse/Extender: Althia Forts Weeks in Treatment: 2 Clinic Level of Care Assessment Items TOOL 4 Quantity Score []  - 0 Use when only an EandM is performed on FOLLOW-UP visit ASSESSMENTS - Nursing Assessment / Reassessment X- 1 10 Reassessment of Co-morbidities (includes updates in patient status) X- 1 5 Reassessment of Adherence to Treatment Plan ASSESSMENTS - Wound and Skin A ssessment / Reassessment []  - 0 Simple Wound Assessment / Reassessment - one wound X- 2 5 Complex Wound  Assessment / Reassessment - multiple wounds GLORENE, LEAZENBY ([id number]) [id number] Page 2 of 8 []  - 0 Dermatologic / Skin Assessment (not related to wound area) ASSESSMENTS - Focused Assessment []  - 0 Circumferential Edema Measurements - multi extremities []  - 0 Nutritional Assessment / Counseling / Intervention []  - 0 Lower Extremity Assessment (monofilament, tuning fork, pulses) []  - 0 Peripheral Arterial Disease Assessment (using hand held doppler) ASSESSMENTS - Ostomy and/or Continence Assessment and Care []  - 0 Incontinence Assessment and Management []  - 0 Ostomy Care Assessment and Management (repouching, etc.) PROCESS - Coordination of Care X - Simple Patient / Family Education for ongoing care 1 15 []  - 0 Complex (extensive) Patient / Family Education for ongoing care X- 1 10 Staff obtains Chiropractor, Records, T Results / Process Orders est []  - 0 Staff telephones HHA, Nursing Homes / Clarify orders / etc []  - 0 Routine Transfer to another Facility (non-emergent condition) []  - 0 Routine Hospital Admission (non-emergent condition) []  - 0 New Admissions / Manufacturing engineer / Ordering NPWT Apligraf, etc. , []  - 0 Emergency Hospital Admission (emergent condition) X- 1 10 Simple Discharge Coordination []  - 0 Complex (extensive) Discharge Coordination PROCESS - Special Needs []  - 0 Pediatric / Minor Patient Management []  - 0 Isolation Patient Management []  - 0 Hearing / Language / Visual special needs []  - 0 Assessment of Community assistance (transportation, D/C planning, etc.) []  - 0 Additional assistance / Altered mentation []  - 0 Support Surface(s) Assessment (bed, cushion, seat, etc.) INTERVENTIONS - Wound Cleansing / Measurement []  - 0 Simple Wound Cleansing - one wound X- 2 5 Complex Wound Cleansing - multiple wounds X- 1 5 Wound Imaging (photographs - any number of wounds) []  - 0 Wound Tracing (instead of  photographs) []  - 0 Simple Wound Measurement - one wound X- 2 5 Complex Wound Measurement - multiple wounds INTERVENTIONS - Wound Dressings []  -

## 2023-06-12 NOTE — Assessment & Plan Note (Addendum)
BP as high as 208/82 in the ED improved with clonidine to 161/70 Holding home metoprolol due to bradycardia Will start on amlodipine 5 mg daily.  Patient is allergic to ACE inhibitors

## 2023-06-12 NOTE — Assessment & Plan Note (Signed)
Sliding scale insulin coverage Hold home glipizide and metformin for now

## 2023-06-12 NOTE — ED Notes (Signed)
Pt in WC, does not want to get on stretcher at this time.

## 2023-06-13 ENCOUNTER — Observation Stay (HOSPITAL_BASED_OUTPATIENT_CLINIC_OR_DEPARTMENT_OTHER)
Admit: 2023-06-13 | Discharge: 2023-06-13 | Disposition: A | Discharge status: Home / Self Care | Payer: Medicare Other | Attending: Internal Medicine | Admitting: Internal Medicine

## 2023-06-13 DIAGNOSIS — I5032 Chronic diastolic (congestive) heart failure: Secondary | ICD-10-CM | POA: Diagnosis not present

## 2023-06-13 DIAGNOSIS — R42 Dizziness and giddiness: Secondary | ICD-10-CM

## 2023-06-13 DIAGNOSIS — R55 Syncope and collapse: Secondary | ICD-10-CM

## 2023-06-13 DIAGNOSIS — R001 Bradycardia, unspecified: Secondary | ICD-10-CM | POA: Diagnosis not present

## 2023-06-13 DIAGNOSIS — I160 Hypertensive urgency: Secondary | ICD-10-CM | POA: Diagnosis not present

## 2023-06-13 LAB — ECHOCARDIOGRAM COMPLETE
AR max vel: 1.83 cm2
AV Area VTI: 2.22 cm2
AV Area mean vel: 2.03 cm2
AV Mean grad: 4.5 mmHg
AV Peak grad: 7.8 mmHg
Ao pk vel: 1.4 m/s
Area-P 1/2: 2.82 cm2
Height: 63 in
MV VTI: 2.62 cm2
S' Lateral: 3.1 cm
Weight: 2608 oz

## 2023-06-13 LAB — TSH: TSH: 2.243 u[IU]/mL (ref 0.350–4.500)

## 2023-06-13 LAB — CBG MONITORING, ED
Glucose-Capillary: 80 mg/dL (ref 70–99)
Glucose-Capillary: 162 mg/dL — ABNORMAL HIGH (ref 70–99)
Glucose-Capillary: 89 mg/dL (ref 70–99)

## 2023-06-13 LAB — CREATININE, SERUM
Creatinine, Ser: 0.53 mg/dL (ref 0.44–1.00)
GFR, Estimated: >60 mL/min (ref >60)

## 2023-06-13 MED ORDER — TRAMADOL HCL 50 MG PO TABS: 50.0000 mg | ORAL_TABLET | Freq: Four times a day (QID) | ORAL | Status: DC | PRN

## 2023-06-13 MED ORDER — ENOXAPARIN SODIUM 40 MG/0.4ML IJ SOSY
40.0000 mg | PREFILLED_SYRINGE | INTRAMUSCULAR | Status: DC
  Administered 2023-06-14 – 2023-06-15 (×2): 40 mg via SUBCUTANEOUS
  Filled 2023-06-13 (×3): qty 0.4

## 2023-06-13 MED ORDER — METOPROLOL TARTRATE 25 MG PO TABS
12.5000 mg | ORAL_TABLET | Freq: Two times a day (BID) | ORAL | Status: DC
  Administered 2023-06-13 (×2): 12.5 mg via ORAL
  Filled 2023-06-13 (×2): qty 1

## 2023-06-13 MED ORDER — AMLODIPINE BESYLATE 10 MG PO TABS
10.0000 mg | ORAL_TABLET | Freq: Every day | ORAL | Status: DC
  Administered 2023-06-13 – 2023-06-15 (×3): 10 mg via ORAL
  Filled 2023-06-13: qty 2
  Filled 2023-06-13 (×3): qty 1

## 2023-06-13 NOTE — Progress Notes (Signed)
*  PRELIMINARY RESULTS* Echocardiogram 2D Echocardiogram has been performed.  Cristela Blue 06/13/2023, 9:34 AM

## 2023-06-13 NOTE — Consult Note (Signed)
Cardiology Consultation   Patient ID: [name] MRN: [id number]; DOB: 18-Apr-1932  Admit date: 06/12/2023 Date of Consult: 06/13/2023  PCP:  Duanne Limerick, MD   [facility] Providers Cardiologist:  Yvonne Kendall, MD  Electrophysiologist:  Lanier Prude, MD  {   Patient Profile:   [name] is a 89 y.o. female with a hx of aortic and mitral valve regurgitation, NSVT, pSVT, HTN, DM2, h/o bradycardia who is being seen 06/13/2023 for the evaluation of bradycardia at the request of Dr. Fran Lowes.  History of Present Illness:   [name] is followed by Dr. Okey Dupre above cardiac issues.  Echo in 2019 showed EF of 65 to 70%, grade 1 diastolic dysfunction, mild to moderate MR.  Previous cardiac monitoring in 2019 showed heart rate 43-1 52 with occasional PACs and PVCs, brief episodes of SVT and nonsustained VT noted.  No sustained arrhythmias or prolonged pauses.  Heart monitor in May 22 showed normal sinus rhythm with an average heart rate of 64 bpm, bundle branch block and IVCD present, 106 episodes of SVT, fastest of which lasted 4 beats, the longest of 4 minutes and 11 seconds, isolated PACs at 8.6%, atrial couplets at 1.4%, and atrial triplets at 1%, isolated PVCs at less than 1%.  Echo November 2022 showed LVEF 60 to 65%, no wall motion abnormalities, grade 1 diastolic dysfunction, moderate TR, mild AI.  Rate limiting medications initially avoided due to baseline bradycardia.  She was referred to EP who started her on 25 mg twice daily.  The patient presented to the ER 7/22 for low heart rate. She was doing physical therapy for her knees and the therapist noted heart rates in the 40s. She was recommended she go to the ER. Patient reported some weakness and headache.  No chest pain, shortness of breath.  She reported intermittent lower leg edema.  No fever or chills.  In the ER blood pressure 147/68-108/82, pulse rate 54 bpm, respiratory rate 18, afebrile, 97% O2.  Labs  showed hemoglobin 10.1, sodium 136, potassium 4.1, normal kidney function back at 1.7. EKG shows SB, 53bpm, IVCD, LVH and 1st degree av block. Telel shows SB HR 50s. CT head was nonacute.   Past Medical History:  Diagnosis Date   GERD (gastroesophageal reflux disease)    Hypertension    Right knee DJD    Type II diabetes mellitus (HCC)     Past Surgical History:  Procedure Laterality Date   BACK SURGERY     CATARACT EXTRACTION Bilateral 03/2016   CERVICAL DISC SURGERY  12/28/2010   "bulging disc; went in on the left side of my neck" (08/19/2013)   JOINT REPLACEMENT     TOTAL KNEE ARTHROPLASTY Left 03/28/2011   Dr Thurston Hole   TOTAL KNEE ARTHROPLASTY Right 08/19/2013   TOTAL KNEE ARTHROPLASTY Right 08/19/2013   Procedure: TOTAL KNEE ARTHROPLASTY;  Surgeon: Nilda Simmer, MD;  Location: [facility] OR;  Service: Orthopedics;  Laterality: Right;     Home Medications:  Prior to Admission medications   Medication Sig Start Date End Date Taking? Authorizing Provider  cyanocobalamin 100 MCG tablet Take 100 mcg by mouth daily.   Yes [provider]  fluocinonide (LIDEX) 0.05 % external solution Apply 1 Application topically daily.   Yes [provider]  furosemide (LASIX) 20 MG tablet Take 1 tablet (20 mg total) by mouth daily. Decrease dosing to three times a week Patient taking differently: Take 20 mg by mouth daily. 09/16/22  Yes Duanne Limerick, MD  glipiZIDE (GLUCOTROL XL) 2.5 MG 24 hr tablet Take 1 tablet (2.5 mg total) by mouth daily with breakfast. 05/09/23  Yes Elizabeth Sauer C, MD  glucose blood (ONE TOUCH ULTRA TEST) test strip USE 1 STRIP TO CHECK GLUCOSE ONCE DAILY 05/17/19  Yes Duanne Limerick, MD  latanoprost (XALATAN) 0.005 % ophthalmic solution Place 1 drop into both eyes at bedtime.   Yes [provider]  loratadine (CLARITIN) 10 MG tablet Take 1 tablet (10 mg total) by mouth daily. 01/06/23  Yes Duanne Limerick, MD  metFORMIN (GLUCOPHAGE) 500 MG tablet Take 1 tablet  (500 mg total) by mouth 2 (two) times daily with a meal. 05/09/23  Yes Duanne Limerick, MD  metoprolol tartrate (LOPRESSOR) 25 MG tablet Take 1 tablet (25 mg total) by mouth 2 (two) times daily. 09/07/22  Yes Lanier Prude, MD  mometasone (NASONEX) 50 MCG/ACT nasal spray Use 2 spray(s) in each nostril once daily 01/06/23  Yes Duanne Limerick, MD  montelukast (SINGULAIR) 10 MG tablet Take 1 tablet (10 mg total) by mouth at bedtime. 01/06/23  Yes Duanne Limerick, MD  omeprazole (PRILOSEC) 20 MG capsule Take 1 capsule (20 mg total) by mouth daily. 09/16/22  Yes Duanne Limerick, MD  docusate sodium (COLACE) 50 MG capsule Take 1 capsule (50 mg total) by mouth 2 (two) times daily. 01/17/19   Duanne Limerick, MD  triamcinolone cream (KENALOG) 0.1 % Apply 1 Application topically 2 (two) times daily. Patient not taking: Reported on 06/12/2023 11/28/22   Duanne Limerick, MD    Inpatient Medications: Scheduled Meds:  amLODipine  5 mg Oral Daily   enoxaparin (LOVENOX) injection  40 mg Subcutaneous Q24H   insulin aspart  0-15 Units Subcutaneous TID WC   insulin aspart  0-5 Units Subcutaneous QHS   sodium chloride flush  3 mL Intravenous Q12H   Continuous Infusions:  PRN Meds: acetaminophen **OR** acetaminophen, HYDROcodone-acetaminophen, ondansetron **OR** ondansetron (ZOFRAN) IV  Allergies:    Allergies  Allergen Reactions   Nsaids    Lisinopril     Cough    Social History:   Social History   Socioeconomic History   Marital status: Widowed    Spouse name: Not on file   Number of children: 3   Years of education: Not on file   Highest education level: Not on file  Occupational History   Occupation: Retired  Tobacco Use   Smoking status: Never   Smokeless tobacco: Never   Tobacco comments:    Smoking cessation materials not required  Vaping Use   Vaping status: Never Used  Substance and Sexual Activity   Alcohol use: Not Currently   Drug use: Never   Sexual activity: Not Currently   Other Topics Concern   Not on file  Social History Narrative   Pt lives alone; does not drive   Social Determinants of Health   Financial Resource Strain: Low Risk  (08/26/2022)   Overall Financial Resource Strain (CARDIA)    Difficulty of Paying Living Expenses: Not hard at all  Food Insecurity: No Food Insecurity (08/26/2022)   Hunger Vital Sign    Worried About Running Out of Food in the Last Year: Never true    Ran Out of Food in the Last Year: Never true  Transportation Needs: No Transportation Needs (08/26/2022)   PRAPARE - Administrator, Civil Service (Medical): No    Lack of Transportation (Non-Medical): No  Physical Activity: Inactive (08/18/2021)   Exercise Vital Sign    Days of Exercise per Week: 0 days    Minutes of Exercise per Session: 0 min  Stress: No Stress Concern Present (08/26/2022)   [employer] of Occupational Health - Occupational Stress Questionnaire    Feeling of Stress : Not at all  Social Connections: Moderately Isolated (08/26/2022)   Social Connection and Isolation Panel [NHANES]    Frequency of Communication with Friends and Family: More than three times a week    Frequency of Social Gatherings with Friends and Family: Once a week    Attends Religious Services: 1 to 4 times per year    Active Member of [employer] or Organizations: No    Attends Banker Meetings: Never    Marital Status: Widowed  Intimate Partner Violence: Not At Risk (08/26/2022)   Humiliation, Afraid, Rape, and Kick questionnaire    Fear of Current or Ex-Partner: No    Emotionally Abused: No    Physically Abused: No    Sexually Abused: No    Family History:    Family History  Problem Relation Age of Onset   Diabetes Mother    Cerebral aneurysm Father      ROS:  Please see the history of present illness.   All other ROS reviewed and negative.     Physical Exam/Data:   Vitals:   06/13/23 0430 06/13/23 0443 06/13/23 0645 06/13/23 0700  BP: (!)  147/73  (!) 144/62 139/61  Pulse: (!) 50  (!) 49 (!) 50  Resp: 20  18 18   Temp:  98 F (36.7 C)    TempSrc:  Oral    SpO2: 100%  98% 99%  Weight:      Height:       No intake or output data in the 24 hours ending 06/13/23 0741    06/12/2023    3:31 PM 05/19/2023    3:53 PM 05/12/2023   12:41 PM  Last 3 Weights  Weight (lbs) 163 lb 165 lb 160 lb  Weight (kg) 73.936 kg 74.844 kg 72.576 kg     Body mass index is 28.87 kg/m.  General:  Well nourished, well developed, in no acute distress HEENT: normal Neck: + JVD Vascular: No carotid bruits; Distal pulses 2+ bilaterally Cardiac:  normal S1, S2; bradycardia, RR; no murmur  Lungs:  diminished breath sounds Abd: soft, nontender, no hepatomegaly  Ext: 1+ right lower leg edema Musculoskeletal:  No deformities, BUE and BLE strength normal and equal Skin: warm and dry  Neuro:  CNs 2-12 intact, no focal abnormalities noted Psych:  Normal affect   EKG:  The EKG was personally reviewed and demonstrates:  Sbm 53bpm, LVH, TWI aVL, 1st degree av block IVCD Telemetry:  Telemetry was personally reviewed and demonstrates:  SB HR 50s  Relevant CV Studies:  Echo 05/2022 1. Left ventricular ejection fraction, by estimation, is 55 to 60%. The  left ventricle has normal function. The left ventricle has no regional  wall motion abnormalities. Left ventricular diastolic parameters are  consistent with Grade I diastolic  dysfunction (impaired relaxation).   2. Right ventricular systolic function is normal. The right ventricular  size is normal. There is mildly elevated pulmonary artery systolic  pressure. The estimated right ventricular systolic pressure is 39.8 mmHg.   3. Left atrial size was mildly dilated.   4. The mitral valve is normal in structure. Mild mitral valve  regurgitation. No evidence of mitral stenosis.  5. Tricuspid valve regurgitation is moderate.   6. The aortic valve is normal in structure. Aortic valve regurgitation is   mild. Aortic valve sclerosis is present, with no evidence of aortic valve  stenosis.   7. The inferior vena cava is normal in size with greater than 50%  respiratory variability, suggesting right atrial pressure of 3 mmHg.   Comparison(s): 11/22-EF 60-65%; Mild to moderate leakage of the mitral and  aortic valves.   Heart monitor 04/2022   The patient was monitored for 14 days.   The predominant rhythm was sinus with an average rate of 78 bpm (range 52-90 bpm in sinus).  Underlying intraventricular conduction delay noted.   There were occasional PACs and rare PVCs.   1955 supraventricular runs were observed, lasting up to 1 hour and 21 minutes.  Maximal rate of SVT was 218 bpm.   There were no patient triggered events.   Sinus rhythm with occasional PACs and plethora of supraventricular runs lasting up to 1 hour and 21 minutes.  Echo 09/2021 1. Left ventricular ejection fraction, by estimation, is 60 to 65%. The  left ventricle has normal function. The left ventricle has no regional  wall motion abnormalities. Left ventricular diastolic parameters are  consistent with Grade I diastolic  dysfunction (impaired relaxation).   2. Right ventricular systolic function is normal. The right ventricular  size is normal. There is mildly elevated pulmonary artery systolic  pressure. The estimated right ventricular systolic pressure is 39.3 mmHg.   3. The mitral valve is normal in structure. Moderate mitral valve  regurgitation. No evidence of mitral stenosis.   4. Tricuspid valve regurgitation is moderate.   5. The aortic valve is calcified. Aortic valve regurgitation is mild.  Borderline aortic valve stenosis.   6. The inferior vena cava is normal in size with greater than 50%  respiratory variability, suggesting right atrial pressure of 3 mmHg.   Comparison(s): LVEF 60-65%.   Heart monitor 04/2021 The patient was monitored for 14 days. The predominant rhythm was sinus with underlying  bundle branch block with an average rate of 64 bpm (range 47-128 bpm and sinus). There were frequent PACs (~10% burden) and rare PVCs. 106 atrial runs occurred, lasting up to 4 minutes, 11 seconds with a maximum rate of 179 bpm. No prolonged pause (greater than 3 seconds) was observed. There were no patient triggered events.   Normal sinus rhythm with frequent PACs and multiple episodes of SVT lasting up to 4 minutes, 11 seconds.  Rare PVCs without NSVT noted.  Laboratory Data:  High Sensitivity Troponin:  No results for input(s): "TROPONINIHS" in the last 720 hours.   Chemistry Recent Labs  Lab 06/12/23 1540 06/12/23 2350  NA 136  --   K 4.1  --   CL 106  --   CO2 22  --   GLUCOSE 95  --   BUN 16  --   CREATININE 0.49 0.53  CALCIUM 9.2  --   MG 1.7  --   GFRNONAA >60 >60  ANIONGAP 8  --     No results for input(s): "PROT", "ALBUMIN", "AST", "ALT", "ALKPHOS", "BILITOT" in the last 168 hours. Lipids No results for input(s): "CHOL", "TRIG", "HDL", "LABVLDL", "LDLCALC", "CHOLHDL" in the last 168 hours.  Hematology Recent Labs  Lab 06/12/23 1540 06/12/23 2350  WBC 5.9 6.2  RBC 3.89 3.67*  HGB 10.1* 9.4*  HCT 32.1* 30.0*  MCV 82.5 81.7  MCH 26.0 25.6*  MCHC 31.5 31.3  RDW  16.9* 17.1*  PLT 349 299   Thyroid No results for input(s): "TSH", "FREET4" in the last 168 hours.  BNPNo results for input(s): "BNP", "PROBNP" in the last 168 hours.  DDimer No results for input(s): "DDIMER" in the last 168 hours.   Radiology/Studies:  CT Head Wo Contrast  Result Date: 06/12/2023 CLINICAL DATA:  Bradycardia, headaches EXAM: CT HEAD WITHOUT CONTRAST TECHNIQUE: Contiguous axial images were obtained from the base of the skull through the vertex without intravenous contrast. RADIATION DOSE REDUCTION: This exam was performed according to the departmental dose-optimization program which includes automated exposure control, adjustment of the mA and/or kV according to patient size and/or use  of iterative reconstruction technique. COMPARISON:  12/27/2010 FINDINGS: Brain: No acute intracranial findings are seen. There are no signs of bleeding within the cranium. Cortical sulci are prominent. There is decreased density in periventricular white matter. Vascular: Unremarkable. Skull: No acute findings are seen. Sinuses/Orbits: Unremarkable. Other: There is increased amount of CSF instead of suggesting partial empty sella. IMPRESSION: No acute intracranial findings are seen in noncontrast CT brain. Atrophy. Small vessel disease. Electronically Signed   By: Ernie Avena M.D.   On: 06/12/2023 20:48     Assessment and Plan:   Bradycardia - patient has known history of bradycardia and pSVT, PACs - she was started on metoprolol 25mg  BID by EP for SVT - heart rates upper 40s and BB was held - patient reports weakness and headache - Keep Mag>2 and K>4 - check an echo and TSH - may only tolerate metoprolol 12.5, may need to see EP as outpatient  HTN - PT metoprolol>held for bradycardia - started on amlodipine - BP still high - increase amlodipine to 10mg  daily  HFpEF MR - echo in 2023 showed LVEF 55-60%, G1DD, mild MR, mild AI - appears relatively euvolemic - PTA lasix - repeat echo ordered  For questions or updates, please contact [facility] Please consult [email or web address] for contact info under    Signed, Elizabelle Fite David Stall, PA-C  06/13/2023 7:41 AM

## 2023-06-13 NOTE — Progress Notes (Signed)
  PROGRESS NOTE    [name]  LKG:[id number] DOB: [birth year] DOA: 06/12/2023 PCP: Duanne Limerick, MD  [id number]  LOS: 0 days   Brief hospital course:   Assessment & Plan: [name] is a 89 y.o. female with medical history significant for hypertension, diabetes, SVT on heart monitoring June 2023 to maximal heart rate 218 bpm, seen by cardiologist, Dr. Lalla Brothers at the time who presented to the ED, with dizziness and headache that started early in the morning, subsequently found to be bradycardic to the 40s while working out with PT, thus prompting the visit to the emergency department.  Patient has been treated with metoprolol tartrate 25 mg p.o. twice daily for history of SVT/PAC/PVC on her heart monitor a year ago.    Symptomatic bradycardia History of paroxysmal SVT Bradycardic to high 40s and 50s in the ED --home metoprolol held.  HR went up when walking with RN. --cardio consulted, rec resuming Lopressor at reduced 12.5 mg BID.  Pt cleared for discharge from cardio.  Hypertensive urgency BP as high as 208/82 in the ED improved with clonidine to 161/70 Patient is allergic to ACE inhibitors Plan: --cont amlodipine 10 mg daily (new) --resume home Lopressor at reduced 12.5 mg BID  Headache Suspect related to elevated blood pressures CT head was nonacute and patient has no focal deficits Improved with Tylenol  Diabetes mellitus with no complication (HCC) --recent A1c 6.0, well controlled --Hold home glipizide and metformin for now --d/c fingersticks and SSI  Chronic knee pain weakness/balance issues  --PT eval rec SNF rehab   DVT prophylaxis: Lovenox SQ Code Status: Full code  Family Communication: daughter updated at bedside today Level of care: Med-Surg Dispo:   The patient is from: home Anticipated d/c is to: SNF rehab Anticipated d/c date is: whenever bed avilable   Subjective and Interval History:  Pt complained of chronic knee pain that's worsened.   PT eval rec SNF rehab.   Objective: Vitals:   06/13/23 1415 06/13/23 1630 06/13/23 1649 06/13/23 1715  BP:  (!) 143/64  (!) 142/59  Pulse: 61 (!) 53  (!) 52  Resp: (!) 22 18  (!) 21  Temp:   98 F (36.7 C)   TempSrc:   Oral   SpO2: 96% 99%  98%  Weight:      Height:       No intake or output data in the 24 hours ending 06/13/23 1839 Filed Weights   06/12/23 1531  Weight: 73.9 kg    Examination:   Constitutional: NAD, AAOx3 HEENT: conjunctivae and lids normal, EOMI CV: No cyanosis.   RESP: normal respiratory effort, on RA Neuro: II - XII grossly intact.   Psych: Normal mood and affect.     Data Reviewed: I have personally reviewed labs and imaging studies  Time spent: 50 minutes  Darlin Priestly, MD [facility] If 7PM-7AM, please contact night-coverage 06/13/2023, 6:39 PM

## 2023-06-13 NOTE — Evaluation (Signed)
Physical Therapy Evaluation Patient Details Name: [name] MRN: [id number] DOB: 1932-04-02 Today's Date: 06/13/2023  History of Present Illness  Pt admitted for presyncope with complaints of bradycardia. Pt with history of aortic and mitral valve regurgitation, HTN, and DM. Episode of bradycardia while working with HHPT  Clinical Impression  Pt is a pleasant 89 year old female who was admitted for presyncope. HR WNL for all mobility this date. Pt performs bed mobility with mod assist and transfers with max assist. Unable to ambulate at this time due to pain. Pt demonstrates deficits with pain/strength/endurance. Pt is currently not at baseline level. Would benefit from skilled PT to address above deficits and promote optimal return to PLOF. Pt will continue to receive skilled PT services while admitted and will defer to TOC/care team for updates regarding disposition planning. Pt placed with pillow under R side to weight shift off pressure ulcer. Sent secure chat to MD to update regarding dispo and patient complaints.      Assistance Recommended at Discharge Frequent or constant Supervision/Assistance  If plan is discharge home, recommend the following:  Can travel by private vehicle  Two people to help with walking and/or transfers;Two people to help with bathing/dressing/bathroom;Help with stairs or ramp for entrance   No    Equipment Recommendations  (TBD)  Recommendations for Other Services       Functional Status Assessment Patient has had a recent decline in their functional status and demonstrates the ability to make significant improvements in function in a reasonable and predictable amount of time.     Precautions / Restrictions Precautions Precautions: Fall Restrictions Weight Bearing Restrictions: No      Mobility  Bed Mobility Overal bed mobility: Needs Assistance Bed Mobility: Supine to Sit, Sit to Supine     Supine to sit: Mod assist Sit to supine: Mod  assist   General bed mobility comments: needs significant assistance for mobility and once seated, is able to maintain with upright posture    Transfers Overall transfer level: Needs assistance Equipment used: Rolling walker (2 wheels) Transfers: Sit to/from Stand Sit to Stand: Max assist           General transfer comment: takes several attempts to stand with mod post lean noted. Increased pain to 10/10 with weight bearing on L LE    Ambulation/Gait               General Gait Details: not able to tolerate  Stairs            Wheelchair Mobility     Tilt Bed    Modified Rankin (Stroke Patients Only)       Balance Overall balance assessment: Needs assistance Sitting-balance support: Feet supported Sitting balance-Leahy Scale: Fair     Standing balance support: Bilateral upper extremity supported Standing balance-Leahy Scale: Poor                               Pertinent Vitals/Pain Pain Assessment Pain Assessment: 0-10 Pain Score: 10-Worst pain ever Pain Location: L ant knee Pain Descriptors / Indicators: Aching Pain Intervention(s): Limited activity within patient's tolerance, Repositioned    Home Living Family/patient expects to be discharged to:: Private residence Living Arrangements: Children Available Help at Discharge: Family Type of Home: House Home Access: Level entry       Home Layout: One level Home Equipment: BSC/3in1;Rolling Environmental consultant (2 wheels)      Prior Function Prior Level  of Function : Needs assist             Mobility Comments: ambulates short distances at baseline using RW. ADLs Comments: Daughter assist with ADLs. Reports no recent falls     Hand Dominance        Extremity/Trunk Assessment   Upper Extremity Assessment Upper Extremity Assessment: Generalized weakness (B UE grossly 3/5)    Lower Extremity Assessment Lower Extremity Assessment: Generalized weakness (B LE grossly 2/5)        Communication   Communication: No difficulties  Cognition Arousal/Alertness: Awake/alert Behavior During Therapy: WFL for tasks assessed/performed Overall Cognitive Status: Within Functional Limits for tasks assessed                                          General Comments      Exercises     Assessment/Plan    PT Assessment Patient needs continued PT services  PT Problem List Decreased strength;Decreased activity tolerance;Decreased balance;Decreased mobility;Decreased knowledge of use of DME;Pain       PT Treatment Interventions DME instruction;Gait training;Stair training;Therapeutic exercise;Balance training    PT Goals (Current goals can be found in the Care Plan section)  Acute Rehab PT Goals Patient Stated Goal: to fix her knee PT Goal Formulation: With patient Time For Goal Achievement: 06/27/23 Potential to Achieve Goals: Good    Frequency Min 1X/week     Co-evaluation               AM-PAC PT "6 Clicks" Mobility  Outcome Measure Help needed turning from your back to your side while in a flat bed without using bedrails?: A Lot Help needed moving from lying on your back to sitting on the side of a flat bed without using bedrails?: A Lot Help needed moving to and from a bed to a chair (including a wheelchair)?: Total Help needed standing up from a chair using your arms (e.g., wheelchair or bedside chair)?: A Lot Help needed to walk in hospital room?: Total Help needed climbing 3-5 steps with a railing? : Total 6 Click Score: 9    End of Session   Activity Tolerance: Patient limited by pain Patient left: in bed Nurse Communication: Mobility status PT Visit Diagnosis: Unsteadiness on feet (R26.81);Muscle weakness (generalized) (M62.81);Difficulty in walking, not elsewhere classified (R26.2);Pain Pain - Right/Left: Left Pain - part of body: Knee    Time: 7782-4235 PT Time Calculation (min) (ACUTE ONLY): 17 min   Charges:   PT  Evaluation $PT Eval Moderate Complexity: 1 Mod PT Treatments $Therapeutic Activity: 8-22 mins PT General Charges $$ ACUTE PT VISIT: 1 Visit         Elizabeth Palau, PT, DPT, GCS [phone]   [name] 06/13/2023, 4:52 PM

## 2023-06-13 NOTE — ED Notes (Signed)
Pt diaper changed. Peri care preformed. Pillow placed under pts side to take weight off pressure ulcers on glutes.

## 2023-06-14 DIAGNOSIS — E119 Type 2 diabetes mellitus without complications: Secondary | ICD-10-CM

## 2023-06-14 DIAGNOSIS — R001 Bradycardia, unspecified: Secondary | ICD-10-CM

## 2023-06-14 DIAGNOSIS — I160 Hypertensive urgency: Secondary | ICD-10-CM | POA: Diagnosis not present

## 2023-06-14 DIAGNOSIS — Z8679 Personal history of other diseases of the circulatory system: Secondary | ICD-10-CM | POA: Diagnosis not present

## 2023-06-14 DIAGNOSIS — R519 Headache, unspecified: Secondary | ICD-10-CM

## 2023-06-14 DIAGNOSIS — R42 Dizziness and giddiness: Secondary | ICD-10-CM | POA: Diagnosis not present

## 2023-06-14 DIAGNOSIS — R55 Syncope and collapse: Secondary | ICD-10-CM | POA: Diagnosis not present

## 2023-06-14 LAB — BASIC METABOLIC PANEL
Anion gap: 7 (ref 5–15)
BUN: 13 mg/dL (ref 8–23)
CO2: 24 mmol/L (ref 22–32)
Calcium: 8.8 mg/dL — ABNORMAL LOW (ref 8.9–10.3)
Chloride: 107 mmol/L (ref 98–111)
Creatinine, Ser: 0.52 mg/dL (ref 0.44–1.00)
GFR, Estimated: >60 mL/min (ref >60)
Glucose, Bld: 110 mg/dL — ABNORMAL HIGH (ref 70–99)
Potassium: 4.1 mmol/L (ref 3.5–5.1)
Sodium: 138 mmol/L (ref 135–145)

## 2023-06-14 LAB — CBC
HCT: 31.8 % — ABNORMAL LOW (ref 36.0–46.0)
Hemoglobin: 9.9 g/dL — ABNORMAL LOW (ref 12.0–15.0)
MCH: 26 pg (ref 26.0–34.0)
MCHC: 31.1 g/dL (ref 30.0–36.0)
MCV: 83.5 fL (ref 80.0–100.0)
Platelets: 323 10*3/uL (ref 150–400)
RBC: 3.81 MIL/uL — ABNORMAL LOW (ref 3.87–5.11)
RDW: 17 % — ABNORMAL HIGH (ref 11.5–15.5)
WBC: 4.9 10*3/uL (ref 4.0–10.5)
nRBC: 0 % (ref 0.0–0.2)

## 2023-06-14 LAB — GLUCOSE, CAPILLARY: Glucose-Capillary: 123 mg/dL — ABNORMAL HIGH (ref 70–99)

## 2023-06-14 LAB — MAGNESIUM: Magnesium: 1.8 mg/dL (ref 1.7–2.4)

## 2023-06-14 MED ORDER — IRBESARTAN 150 MG PO TABS
150.0000 mg | ORAL_TABLET | Freq: Every day | ORAL | Status: DC
  Administered 2023-06-14 – 2023-06-15 (×2): 150 mg via ORAL
  Filled 2023-06-14 (×2): qty 1

## 2023-06-14 NOTE — Plan of Care (Signed)
  Problem: Education: Goal: Ability to describe self-care measures that may prevent or decrease complications (Diabetes Survival Skills Education) will improve Outcome: Progressing Goal: Individualized Educational Video(s) Outcome: Progressing   Problem: Coping: Goal: Ability to adjust to condition or change in health will improve Outcome: Progressing   Problem: Fluid Volume: Goal: Ability to maintain a balanced intake and output will improve Outcome: Progressing   Problem: Health Behavior/Discharge Planning: Goal: Ability to identify and utilize available resources and services will improve Outcome: Progressing Goal: Ability to manage health-related needs will improve Outcome: Progressing   Problem: Metabolic: Goal: Ability to maintain appropriate glucose levels will improve Outcome: Progressing   Problem: Nutritional: Goal: Maintenance of adequate nutrition will improve Outcome: Progressing Goal: Progress toward achieving an optimal weight will improve Outcome: Progressing   Problem: Skin Integrity: Goal: Risk for impaired skin integrity will decrease Outcome: Progressing   Problem: Tissue Perfusion: Goal: Adequacy of tissue perfusion will improve Outcome: Progressing   Problem: Education: Goal: Knowledge of condition and prescribed therapy will improve Outcome: Progressing   Problem: Cardiac: Goal: Will achieve and/or maintain adequate cardiac output Outcome: Progressing   Problem: Physical Regulation: Goal: Complications related to the disease process, condition or treatment will be avoided or minimized Outcome: Progressing   Problem: Education: Goal: Knowledge of General Education information will improve Description: Including pain rating scale, medication(s)/side effects and non-pharmacologic comfort measures Outcome: Progressing   Problem: Health Behavior/Discharge Planning: Goal: Ability to manage health-related needs will improve Outcome:  Progressing   Problem: Clinical Measurements: Goal: Ability to maintain clinical measurements within normal limits will improve Outcome: Progressing Goal: Will remain free from infection Outcome: Progressing Goal: Diagnostic test results will improve Outcome: Progressing Goal: Respiratory complications will improve Outcome: Progressing Goal: Cardiovascular complication will be avoided Outcome: Progressing   Problem: Activity: Goal: Risk for activity intolerance will decrease Outcome: Progressing   Problem: Nutrition: Goal: Adequate nutrition will be maintained Outcome: Progressing   Problem: Coping: Goal: Level of anxiety will decrease Outcome: Progressing   Problem: Elimination: Goal: Will not experience complications related to bowel motility Outcome: Progressing Goal: Will not experience complications related to urinary retention Outcome: Progressing   Problem: Pain Managment: Goal: General experience of comfort will improve Outcome: Progressing   Problem: Safety: Goal: Ability to remain free from injury will improve Outcome: Progressing   Problem: Skin Integrity: Goal: Risk for impaired skin integrity will decrease Outcome: Progressing

## 2023-06-14 NOTE — Progress Notes (Signed)
PROGRESS NOTE    [name]  ZOX:[id number] DOB: June 22, 1932 DOA: 06/12/2023 PCP: Duanne Limerick, MD   Assessment & Plan:   Principal Problem:   Postural dizziness with presyncope Active Problems:   Symptomatic bradycardia   Hypertensive urgency   Headache   History of supraventricular tachycardia   Essential hypertension   Diabetes mellitus with no complication (HCC)  Assessment and Plan: Symptomatic bradycardia: d/c metoprolol as per cardio. Hx of paroxysmal SVT. Continue on tele  Hypertensive urgency: urgency resolved but still w/ HTN. Continue on amlodipine, irbesartan   Normocytic anemia: H&H are labile. No need for a transfusion currently    Headache: likely secondary to HTN urgency. CT head showed no acute intracranial findings. Tylenol prn    DM2: well controlled, HbA1c 6.0. Diet controlled   Chronic knee pain: tylenol prn. PT/OT recs SNF but refuses SNF and wants to continue home health   Generalized weakness: PT/OT recs SNF. Pt refuses SNF        DVT prophylaxis: lovenox  Code Status:  full  Family Communication:  Disposition Plan: likely d/c home w/ home health   Level of care: Med-Surg Status is: Observation The patient remains OBS appropriate and will d/c before 2 midnights.    Consultants:  cardio  Procedures:   Antimicrobials:    Subjective: Pt c/o malaise   Objective: Vitals:   06/14/23 0315 06/14/23 0400 06/14/23 0545 06/14/23 0657  BP:  (!) 157/62    Pulse: (!) 43 (!) 47 (!) 33   Resp: 16 (!) 21 18   Temp:    97.7 F (36.5 C)  TempSrc:    Oral  SpO2: 97% 94% 99%   Weight:      Height:       No intake or output data in the 24 hours ending 06/14/23 0741 Filed Weights   06/12/23 1531  Weight: 73.9 kg    Examination:  General exam: Appears calm and comfortable  Respiratory system: Clear to auscultation. Respiratory effort normal. Cardiovascular system: S1 & S2 +. No rubs, gallops or clicks.  Gastrointestinal  system: Abdomen is nondistended, soft and nontender. Normal bowel sounds heard. Central nervous system: Alert and oriented. Moves all extremities  Psychiatry: Judgement and insight appear normal. Mood & affect appropriate.     Data Reviewed: I have personally reviewed following labs and imaging studies  CBC: Recent Labs  Lab 06/12/23 1540 06/12/23 2350 06/14/23 0627  WBC 5.9 6.2 4.9  HGB 10.1* 9.4* 9.9*  HCT 32.1* 30.0* 31.8*  MCV 82.5 81.7 83.5  PLT 349 299 323   Basic Metabolic Panel: Recent Labs  Lab 06/12/23 1540 06/12/23 2350 06/14/23 0627  NA 136  --  138  K 4.1  --  4.1  CL 106  --  107  CO2 22  --  24  GLUCOSE 95  --  110*  BUN 16  --  13  CREATININE 0.49 0.53 0.52  CALCIUM 9.2  --  8.8*  MG 1.7  --  1.8   GFR: Estimated Creatinine Clearance: 45.9 mL/min (by C-G formula based on SCr of 0.52 mg/dL). Liver Function Tests: No results for input(s): "AST", "ALT", "ALKPHOS", "BILITOT", "PROT", "ALBUMIN" in the last 168 hours. No results for input(s): "LIPASE", "AMYLASE" in the last 168 hours. No results for input(s): "AMMONIA" in the last 168 hours. Coagulation Profile: No results for input(s): "INR", "PROTIME" in the last 168 hours. Cardiac Enzymes: No results for input(s): "CKTOTAL", "CKMB", "CKMBINDEX", "TROPONINI" in the last  168 hours. BNP (last 3 results) No results for input(s): "PROBNP" in the last 8760 hours. HbA1C: No results for input(s): "HGBA1C" in the last 72 hours. CBG: Recent Labs  Lab 06/12/23 2336 06/13/23 0726 06/13/23 1133 06/13/23 1627  GLUCAP 154* 80 162* 89   Lipid Profile: No results for input(s): "CHOL", "HDL", "LDLCALC", "TRIG", "CHOLHDL", "LDLDIRECT" in the last 72 hours. Thyroid Function Tests: Recent Labs    06/13/23 0745  TSH 2.243   Anemia Panel: No results for input(s): "VITAMINB12", "FOLATE", "FERRITIN", "TIBC", "IRON", "RETICCTPCT" in the last 72 hours. Sepsis Labs: No results for input(s): "PROCALCITON",  "LATICACIDVEN" in the last 168 hours.  No results found for this or any previous visit (from the past 240 hour(s)).       Radiology Studies: ECHOCARDIOGRAM COMPLETE  Result Date: 06/13/2023    ECHOCARDIOGRAM REPORT   Patient Name:   [name] Date of Exam: 06/13/2023 Medical Rec #:  [id number]       Height:       63.0 in Accession #:    [id number]      Weight:       163.0 lb Date of Birth:  [birth year]       BSA:          1.773 m Patient Age:    89 years        BP:           139/61 mmHg Patient Gender: F               HR:           50 bpm. Exam Location:  [facility] Procedure: 2D Echo, Cardiac Doppler and Color Doppler Indications:     Syncope R55  History:         Patient has prior history of Echocardiogram examinations, most                  recent 06/09/2022. Risk Factors:Hypertension and Diabetes.  Sonographer:     Cristela Blue Referring Phys:  [id number] Andris Baumann Diagnosing Phys: Lorine Bears MD IMPRESSIONS  1. Left ventricular ejection fraction, by estimation, is 55 to 60%. The left ventricle has normal function. The left ventricle has no regional wall motion abnormalities. There is mild left ventricular hypertrophy. Left ventricular diastolic parameters are indeterminate.  2. Right ventricular systolic function is normal. The right ventricular size is normal. Tricuspid regurgitation signal is inadequate for assessing PA pressure.  3. Left atrial size was mildly dilated.  4. The mitral valve is normal in structure. Mild mitral valve regurgitation. No evidence of mitral stenosis.  5. The aortic valve is normal in structure. Aortic valve regurgitation is mild. Aortic valve sclerosis/calcification is present, without any evidence of aortic stenosis. FINDINGS  Left Ventricle: Left ventricular ejection fraction, by estimation, is 55 to 60%. The left ventricle has normal function. The left ventricle has no regional wall motion abnormalities. The left ventricular internal cavity size was normal in size.  There is  mild left ventricular hypertrophy. Left ventricular diastolic parameters are indeterminate. Right Ventricle: The right ventricular size is normal. No increase in right ventricular wall thickness. Right ventricular systolic function is normal. Tricuspid regurgitation signal is inadequate for assessing PA pressure. Left Atrium: Left atrial size was mildly dilated. Right Atrium: Right atrial size was normal in size. Pericardium: There is no evidence of pericardial effusion. Mitral Valve: The mitral valve is normal in structure. There is mild thickening of the mitral valve leaflet(s). Mild mitral  annular calcification. Mild mitral valve regurgitation. No evidence of mitral valve stenosis. MV peak gradient, 2.8 mmHg. The mean  mitral valve gradient is 1.0 mmHg. Tricuspid Valve: The tricuspid valve is normal in structure. Tricuspid valve regurgitation is not demonstrated. No evidence of tricuspid stenosis. Aortic Valve: The aortic valve is normal in structure. Aortic valve regurgitation is mild. Aortic valve sclerosis/calcification is present, without any evidence of aortic stenosis. Aortic valve mean gradient measures 4.5 mmHg. Aortic valve peak gradient measures 7.8 mmHg. Aortic valve area, by VTI measures 2.22 cm. Pulmonic Valve: The pulmonic valve was normal in structure. Pulmonic valve regurgitation is not visualized. No evidence of pulmonic stenosis. Aorta: The aortic root is normal in size and structure. Venous: The inferior vena cava was not well visualized. IAS/Shunts: No atrial level shunt detected by color flow Doppler.  LEFT VENTRICLE PLAX 2D LVIDd:         4.50 cm   Diastology LVIDs:         3.10 cm   LV e' medial:    4.90 cm/s LV PW:         1.00 cm   LV E/e' medial:  14.2 LV IVS:        1.40 cm   LV e' lateral:   4.46 cm/s LVOT diam:     2.00 cm   LV E/e' lateral: 15.6 LV SV:         70 LV SV Index:   39 LVOT Area:     3.14 cm  RIGHT VENTRICLE RV Basal diam:  3.50 cm RV Mid diam:    2.60 cm LEFT  ATRIUM           Index        RIGHT ATRIUM           Index LA diam:      3.80 cm 2.14 cm/m   RA Area:     14.50 cm LA Vol (A2C): 21.3 ml 12.02 ml/m  RA Volume:   36.20 ml  20.42 ml/m LA Vol (A4C): 79.4 ml 44.79 ml/m  AORTIC VALVE AV Area (Vmax):    1.83 cm AV Area (Vmean):   2.03 cm AV Area (VTI):     2.22 cm AV Vmax:           139.50 cm/s AV Vmean:          96.500 cm/s AV VTI:            0.314 m AV Peak Grad:      7.8 mmHg AV Mean Grad:      4.5 mmHg LVOT Vmax:         81.40 cm/s LVOT Vmean:        62.500 cm/s LVOT VTI:          0.222 m LVOT/AV VTI ratio: 0.71  AORTA Ao Root diam: 2.50 cm MITRAL VALVE               TRICUSPID VALVE MV Area (PHT): 2.82 cm    TR Peak grad:   8.2 mmHg MV Area VTI:   2.62 cm    TR Vmax:        143.00 cm/s MV Peak grad:  2.8 mmHg MV Mean grad:  1.0 mmHg    SHUNTS MV Vmax:       0.83 m/s    Systemic VTI:  0.22 m MV Vmean:      51.0 cm/s   Systemic Diam: 2.00 cm MV Decel Time: 269 msec  MV E velocity: 69.70 cm/s MV A velocity: 74.40 cm/s MV E/A ratio:  0.94 Lorine Bears MD Electronically signed by Lorine Bears MD Signature Date/Time: 06/13/2023/1:05:56 PM    Final    CT Head Wo Contrast  Result Date: 06/12/2023 CLINICAL DATA:  Bradycardia, headaches EXAM: CT HEAD WITHOUT CONTRAST TECHNIQUE: Contiguous axial images were obtained from the base of the skull through the vertex without intravenous contrast. RADIATION DOSE REDUCTION: This exam was performed according to the departmental dose-optimization program which includes automated exposure control, adjustment of the mA and/or kV according to patient size and/or use of iterative reconstruction technique. COMPARISON:  12/27/2010 FINDINGS: Brain: No acute intracranial findings are seen. There are no signs of bleeding within the cranium. Cortical sulci are prominent. There is decreased density in periventricular white matter. Vascular: Unremarkable. Skull: No acute findings are seen. Sinuses/Orbits: Unremarkable. Other: There  is increased amount of CSF instead of suggesting partial empty sella. IMPRESSION: No acute intracranial findings are seen in noncontrast CT brain. Atrophy. Small vessel disease. Electronically Signed   By: Ernie Avena M.D.   On: 06/12/2023 20:48        Scheduled Meds:  amLODipine  10 mg Oral Daily   enoxaparin (LOVENOX) injection  40 mg Subcutaneous Q24H   metoprolol tartrate  12.5 mg Oral BID   sodium chloride flush  3 mL Intravenous Q12H   Continuous Infusions:   LOS: 0 days    Time spent: 25 mins     Charise Killian, MD [facility] Pager [phone]  If 7PM-7AM, please contact night-coverage [email or web address] 06/14/2023, 7:41 AM

## 2023-06-14 NOTE — TOC CM/SW Note (Signed)
Received message from Toni Arthurs of [facility]. They are active with pt for PT and Nursing

## 2023-06-14 NOTE — Progress Notes (Signed)
Progress Note  Patient Name: [name] Date of Encounter: 06/14/2023  Primary Cardiologist: End  Subjective   No chest pain, dyspnea, dizziness, presyncope, or syncope. Remains bradycardic in the 40s to 50s bpm with occasional PVCs. Brief episode of heart rates into the 70s to 80 bpm. BP in the 120s to 150s mmHg systolic.   Inpatient Medications    Scheduled Meds:  amLODipine  10 mg Oral Daily   enoxaparin (LOVENOX) injection  40 mg Subcutaneous Q24H   sodium chloride flush  3 mL Intravenous Q12H   Continuous Infusions:  PRN Meds: acetaminophen **OR** acetaminophen, ondansetron **OR** ondansetron (ZOFRAN) IV, traMADol   Vital Signs    Vitals:   06/14/23 0545 06/14/23 0657 06/14/23 0800 06/14/23 0942  BP:   (!) 151/58 (!) 191/89  Pulse: (!) 33  (!) 49 (!) 57  Resp: 18  16 18   Temp:  97.7 F (36.5 C)  97.8 F (36.6 C)  TempSrc:  Oral    SpO2: 99%  96% 99%  Weight:      Height:       No intake or output data in the 24 hours ending 06/14/23 0950 Filed Weights   06/12/23 1531  Weight: 73.9 kg    Telemetry    Sinus bradycardia, 40s to 50s bpm with occasional PVCs, brief heart rate trend to the 70s to 80s bpm - Personally Reviewed  ECG    No new tracings - Personally Reviewed  Physical Exam   GEN: No acute distress.   Neck: No JVD. Cardiac: Bradycardic, no murmurs, rubs, or gallops.  Respiratory: Clear to auscultation bilaterally.  GI: Soft, nontender, non-distended.   MS: No edema; No deformity. Neuro:  Alert and oriented x 3; Nonfocal.  Psych: Normal affect.  Labs    Chemistry Recent Labs  Lab 06/12/23 1540 06/12/23 2350 06/14/23 0627  NA 136  --  138  K 4.1  --  4.1  CL 106  --  107  CO2 22  --  24  GLUCOSE 95  --  110*  BUN 16  --  13  CREATININE 0.49 0.53 0.52  CALCIUM 9.2  --  8.8*  GFRNONAA >60 >60 >60  ANIONGAP 8  --  7     Hematology Recent Labs  Lab 06/12/23 1540 06/12/23 2350 06/14/23 0627  WBC 5.9 6.2 4.9  RBC  3.89 3.67* 3.81*  HGB 10.1* 9.4* 9.9*  HCT 32.1* 30.0* 31.8*  MCV 82.5 81.7 83.5  MCH 26.0 25.6* 26.0  MCHC 31.5 31.3 31.1  RDW 16.9* 17.1* 17.0*  PLT 349 299 323    Cardiac EnzymesNo results for input(s): "TROPONINI" in the last 168 hours. No results for input(s): "TROPIPOC" in the last 168 hours.   BNPNo results for input(s): "BNP", "PROBNP" in the last 168 hours.   DDimer No results for input(s): "DDIMER" in the last 168 hours.   Radiology    CT Head Wo Contrast  Result Date: 06/12/2023 IMPRESSION: No acute intracranial findings are seen in noncontrast CT brain. Atrophy. Small vessel disease. Electronically Signed   By: Ernie Avena M.D.   On: 06/12/2023 20:48    Cardiac Studies   2D echo 06/13/2023: 1. Left ventricular ejection fraction, by estimation, is 55 to 60%. The  left ventricle has normal function. The left ventricle has no regional  wall motion abnormalities. There is mild left ventricular hypertrophy.  Left ventricular diastolic parameters  are indeterminate.   2. Right ventricular systolic function is normal.  The right ventricular  size is normal. Tricuspid regurgitation signal is inadequate for assessing  PA pressure.   3. Left atrial size was mildly dilated.   4. The mitral valve is normal in structure. Mild mitral valve  regurgitation. No evidence of mitral stenosis.   5. The aortic valve is normal in structure. Aortic valve regurgitation is  mild. Aortic valve sclerosis/calcification is present, without any  evidence of aortic stenosis.   Patient Profile     89 y.o. female with history of aortic and mitral valve regurgitation, NSVT, PSVT, PVCs, hypertension, type 2 diabetes mellitus, first degree AV block, LBBB, and normocytic anemia who was admitted with intermittent headache over the last few days as well as generalized weakness  and we are seeing for bradycardia.   Assessment & Plan    1. Sinus bradycardia: -Admitted with intermittent  headache and generalized weakness -Bradycardia unlikely to be contributing to headache -Stop metoprolol, last received Lopressor 12.5 mg on 7/23 at 2136 -No indication for venous temp wire or PPM at this time -Monitor atrial and ventricular ectopy burden on telemetry  -Plan for outpatient EP evaluation  -Magnesium and potassium normal -TSH normal  2. Valvular heart disease: -Mild mitral and aortic regurgitation by echo this admission -Outpatient follow up   3. PVCs,PSVT, NSVT: -Occasional isolated PVCs noted on tele -Monitor off beta blocker -Outpatient follow up with EP  4. HTN: -Blood pressure has overall been reasonably controlled with rare elevated readings in the 180s to 190s mmHg systolic -Would allow for some degree of permissive hypertension with bradycardia noted -Amlodipine 10 mg -If BP continues to consistently run high, could add ARB -Avoid AV nodal blocking medications     For questions or updates, please contact [facility] Please consult [email or web address] for contact info under Cardiology/STEMI.    Signed, Eula Listen, PA-C [facility] Pager: [phone] 06/14/2023, 9:50 AM

## 2023-06-14 NOTE — TOC Initial Note (Signed)
Transition of Care [facility]) - Initial/Assessment Note    Patient Details  Name: [name] MRN: [id number] Date of Birth: [birth year]  Transition of Care [facility]) CM/SW Contact:    Kreg Shropshire, RN Phone Number: 06/14/2023, 9:14 AM  Clinical Narrative:                 Cm completed assessment for pt. PT/OT recommended SNF. Cm went to assess and pt does not want to go to SNF. She wants to stay home and continue services with [facility] PT/RN. She has walker, cane, and BSC at home. She lives with daughter and daughter provides transportation. Her PCP is Dr. Yetta Barre and she receives her medication at [facility]. Toni Arthurs of [place] stated they can resume services if the pt is under observation. If pt is admitted, they will need new orders.    Barriers to Discharge: Continued Medical Work up   Patient Goals and CMS Choice   CMS Medicare.gov Compare Post Acute Care list provided to:: Patient Choice offered to / list presented to : Patient [place] ownership interest in [facility].provided to:: Patient    Expected Discharge Plan and Services     Post Acute Care Choice: Home Health Living arrangements for the past 2 months: Single Family Home                           HH Arranged: PT, RN HH Agency:  [facility]) Date HH Agency Contacted: 06/14/23 Time HH Agency Contacted: [phone] Representative spoke with at [facility] Agency: Orbie Pyo  Prior Living Arrangements/Services Living arrangements for the past 2 months: Single Family Home Lives with:: Self, Adult Children Patient language and need for interpreter reviewed:: Yes Do you feel safe going back to the place where you live?: Yes      Need for Family Participation in Patient Care: Yes (Comment) Care giver support system in place?: Yes (comment) Current home services: DME    Activities of Daily Living Home Assistive Devices/Equipment: Walker (specify type), Cane (specify quad or straight), Wheelchair, Bedside  commode/3-in-1 ADL Screening (condition at time of admission) Patient's cognitive ability adequate to safely complete daily activities?: Yes Is the patient deaf or have difficulty hearing?: No Does the patient have difficulty seeing, even when wearing glasses/contacts?: No Does the patient have difficulty concentrating, remembering, or making decisions?: No Patient able to express need for assistance with ADLs?: Yes Does the patient have difficulty dressing or bathing?: Yes Independently performs ADLs?: Yes (appropriate for developmental age) Does the patient have difficulty walking or climbing stairs?: Yes Weakness of Legs: Both Weakness of Arms/Hands: Both  Permission Sought/Granted Permission sought to share information with : Case Manager, Family Supports, Other (comment) ([facility])                Emotional Assessment   Attitude/Demeanor/Rapport: Engaged Affect (typically observed): Calm Orientation: : Oriented to Self, Oriented to Place, Oriented to  Time, Oriented to Situation      Admission diagnosis:  Postural dizziness with presyncope [R42, R55] Patient Active Problem List   Diagnosis Date Noted   Postural dizziness with presyncope 06/12/2023   Symptomatic bradycardia 06/12/2023   Headache 06/12/2023   History of supraventricular tachycardia 06/12/2023   Hypertensive urgency 06/12/2023   Valvular heart disease 07/23/2021   Chronic pain of left knee 06/17/2021   Chronic pain syndrome 06/17/2021   Near syncope 04/12/2021   Pain due to total left knee replacement (HCC)  03/02/2021   Aortic valve regurgitation 10/23/2020   Bradycardia 09/04/2019   PVC's (premature ventricular contractions) 09/04/2019   Leg edema 09/04/2019   Sinus bradycardia 03/16/2018   PSVT (paroxysmal supraventricular tachycardia) 03/16/2018   NSVT (nonsustained ventricular tachycardia) (HCC) 03/16/2018   Malignant HTN with heart disease, w/o CHF, w/o chronic kidney disease 02/05/2018    Hyperlipidemia 09/24/2015   Diabetes mellitus with no complication (HCC) 03/16/2015   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   Arthritis of knee, degenerative 03/16/2015   Sinus infection 03/16/2015   DM (diabetes mellitus) (HCC)    Essential hypertension    GERD (gastroesophageal reflux disease)    History of bilateral knee replacement    PCP:  Duanne Limerick, MD Pharmacy:   [facility] [address], [place] - [address] [place] [address] Phone: [phone] Fax: [phone]  [id number] - [address], [place] - [address] [place] [address] Phone: [phone] Fax: [phone]  [id number] - [place], [place] - [address] [place] [address] Phone: [phone] Fax: [phone]     Social Determinants of Health (SDOH) Social History: SDOH Screenings   Food Insecurity: No Food Insecurity (06/14/2023)  Housing: Low Risk  (06/14/2023)  Transportation Needs: No Transportation Needs (06/14/2023)  Utilities: Not At Risk (06/14/2023)  Alcohol Screen: Low Risk  (08/18/2021)  Depression (PHQ2-9): Low Risk  (05/19/2023)  Financial Resource Strain: Low Risk  (08/26/2022)  Physical Activity: Inactive (08/18/2021)  Social Connections: Moderately Isolated (08/26/2022)  Stress: No Stress Concern Present (08/26/2022)  Tobacco Use: Low Risk  (06/12/2023)   SDOH Interventions:     Readmission Risk Interventions     No data to display

## 2023-06-14 NOTE — Care Management Obs Status (Signed)
MEDICARE OBSERVATION STATUS NOTIFICATION   Patient Details  Name: [name] MRN: [id number] Date of Birth: [birth year]   Medicare Observation Status Notification Given:  Yes    Margarito Liner, LCSW 06/14/2023, 2:27 PM

## 2023-06-14 NOTE — Progress Notes (Signed)
Mobility Specialist - Progress Note   06/14/23 1417  Mobility  Activity Stood at bedside  Level of Assistance +2 (takes two people)  Musician  Activity Response Tolerated fair  $Mobility charge 1 Mobility     Pt lying in bed upon arrival, utilizing RA. Pt requesting assistance to [facility]. Pt reports pain in B knees with L > R. Completed bed mobility with maxA. Intermittent post lean in sitting. STS x3 with maxA +2; post lean. VC for anterior/lateral weight shifting. B foot blocking to prevent sliding. Pt returned to bed with assist on LE, alarm set, needs in reach. Pt left on bedpan.   [name] Mobility Specialist 06/14/23, 2:35 PM

## 2023-06-15 DIAGNOSIS — I160 Hypertensive urgency: Secondary | ICD-10-CM | POA: Diagnosis not present

## 2023-06-15 DIAGNOSIS — Z8679 Personal history of other diseases of the circulatory system: Secondary | ICD-10-CM | POA: Diagnosis not present

## 2023-06-15 DIAGNOSIS — R42 Dizziness and giddiness: Secondary | ICD-10-CM | POA: Diagnosis not present

## 2023-06-15 DIAGNOSIS — R001 Bradycardia, unspecified: Secondary | ICD-10-CM | POA: Diagnosis not present

## 2023-06-15 DIAGNOSIS — R519 Headache, unspecified: Secondary | ICD-10-CM | POA: Diagnosis not present

## 2023-06-15 LAB — BASIC METABOLIC PANEL
Anion gap: 10 (ref 5–15)
Calcium: 8.7 mg/dL — ABNORMAL LOW (ref 8.9–10.3)
Chloride: 106 mmol/L (ref 98–111)
Creatinine, Ser: 0.58 mg/dL (ref 0.44–1.00)
GFR, Estimated: >60 mL/min (ref >60)
Glucose, Bld: 120 mg/dL — ABNORMAL HIGH (ref 70–99)
Potassium: 4 mmol/L (ref 3.5–5.1)
Sodium: 139 mmol/L (ref 135–145)

## 2023-06-15 LAB — CBC
Hemoglobin: 10 g/dL — ABNORMAL LOW (ref 12.0–15.0)
MCHC: 31.5 g/dL (ref 30.0–36.0)
MCV: 81.9 fL (ref 80.0–100.0)
Platelets: 311 10*3/uL (ref 150–400)
RBC: 3.87 MIL/uL (ref 3.87–5.11)
RDW: 17 % — ABNORMAL HIGH (ref 11.5–15.5)
nRBC: 0 % (ref 0.0–0.2)

## 2023-06-15 LAB — GLUCOSE, CAPILLARY: Glucose-Capillary: 115 mg/dL — ABNORMAL HIGH (ref 70–99)

## 2023-06-15 MED ORDER — IRBESARTAN 150 MG PO TABS: 150.0000 mg | ORAL_TABLET | Freq: Every day | ORAL | 0 refills | Status: DC

## 2023-06-15 MED ORDER — AMLODIPINE BESYLATE 10 MG PO TABS: 10.0000 mg | ORAL_TABLET | Freq: Every day | ORAL | 0 refills | Status: DC

## 2023-06-15 NOTE — Discharge Summary (Signed)
Physician Discharge Summary  [name] VZD:[id number] DOB: [birth year] DOA: 06/12/2023  PCP: Duanne Limerick, MD  Admit date: 06/12/2023 Discharge date: 06/15/2023  Admitted From: home  Disposition:  home w/ home health   Recommendations for Outpatient Follow-up:  Follow up with PCP in 1-2 weeks F/u w/ cardio, Dr. Okey Dupre, in 2-3 weeks   Home Health: yes Equipment/Devices:  Discharge Condition: stable  CODE STATUS: full  Diet recommendation: Heart Healthy / Carb Modified  Brief/Interim Summary: HPI was taken from Dr. Para March: [name] is a 89 y.o. female with medical history significant for hypertension, GERD, diabetes, SVT on heart monitoring June 2023 to maximal heart rate 218 bpm, seen by cardiologist, Dr. Lalla Brothers at the time who presents to the ED, with dizziness and headache that started early in the morning, subsequently found to be bradycardic to the 40s while working out with PT, thus prompting the visit to the emergency department.  Patient has been treated with metoprolol tartrate 25 mg p.o. twice daily for history of SVT/PAC/PVC on her heart monitor a year ago.  She denies chest pain, shortness of breath or one-sided weakness numbness or tingling. ED course and data review: Heart rate in the 50s ranging from 51-57 and hypertensive with SBP as high as 208/82.  Other vitals within normal limits. Labs CBC, CMP within normal limits except for mild anemia of 10.1.  Magnesium 1.7, potassium 4.1. EKG, personally viewed and interpreted showing sinus rhythm at 55 with LBBB CT head nonacute showing atrophy and small vessel disease Patient was treated with acetaminophen for headache and clonidine 0.1 mg with improvement in BP to 161/70. Observation requested   Discharge Diagnoses:  Principal Problem:   Postural dizziness with presyncope Active Problems:   Symptomatic bradycardia   Hypertensive urgency   Headache   History of supraventricular tachycardia   Essential  hypertension   Diabetes mellitus with no complication (HCC) Symptomatic bradycardia: d/c metoprolol as per cardio. Hx of paroxysmal SVT. Continue on tele  Hypertensive urgency: urgency resolved but still w/ HTN. Continue on amlodipine, irbesartan   Normocytic anemia: H&H are labile. No need for a transfusion currently    Headache: likely secondary to HTN urgency. CT head showed no acute intracranial findings. Tylenol prn    DM2: well controlled, HbA1c 6.0. Restart home anti-DM meds at d/c    Chronic knee pain: tylenol prn. PT/OT recs SNF but refuses SNF and wants to continue home health   Generalized weakness: PT/OT recs SNF. Pt refuses SNF. D/c home w/ [facility]      Discharge Instructions  Discharge Instructions     Diet - low sodium heart healthy   Complete by: As directed    Diet Carb Modified   Complete by: As directed    Discharge instructions   Complete by: As directed    F/u w/ PCP in 1-2 weeks. F/u w/ cardio,Dr. End, in 2-3 weeks.   Increase activity slowly   Complete by: As directed    No wound care   Complete by: As directed       Allergies as of 06/15/2023       Reactions   Nsaids    Lisinopril    Cough        Medication List     STOP taking these medications    metoprolol tartrate 25 MG tablet Commonly known as: LOPRESSOR   triamcinolone cream 0.1 % Commonly known as: KENALOG       TAKE these medications  amLODipine 10 MG tablet Commonly known as: NORVASC Take 1 tablet (10 mg total) by mouth daily. Start taking on: June 16, 2023   cyanocobalamin 100 MCG tablet Take 100 mcg by mouth daily.   docusate sodium 50 MG capsule Commonly known as: COLACE Take 1 capsule (50 mg total) by mouth 2 (two) times daily.   fluocinonide 0.05 % external solution Commonly known as: LIDEX Apply 1 Application topically daily.   furosemide 20 MG tablet Commonly known as: LASIX Take 1 tablet (20 mg total) by mouth daily. Decrease dosing to three times a  week What changed: additional instructions   glipiZIDE 2.5 MG 24 hr tablet Commonly known as: GLUCOTROL XL Take 1 tablet (2.5 mg total) by mouth daily with breakfast.   glucose blood test strip Commonly known as: ONE TOUCH ULTRA TEST USE 1 STRIP TO CHECK GLUCOSE ONCE DAILY   irbesartan 150 MG tablet Commonly known as: AVAPRO Take 1 tablet (150 mg total) by mouth daily. Start taking on: June 16, 2023   latanoprost 0.005 % ophthalmic solution Commonly known as: XALATAN Place 1 drop into both eyes at bedtime.   loratadine 10 MG tablet Commonly known as: CLARITIN Take 1 tablet (10 mg total) by mouth daily.   metFORMIN 500 MG tablet Commonly known as: GLUCOPHAGE Take 1 tablet (500 mg total) by mouth 2 (two) times daily with a meal.   mometasone 50 MCG/ACT nasal spray Commonly known as: NASONEX Use 2 spray(s) in each nostril once daily   montelukast 10 MG tablet Commonly known as: SINGULAIR Take 1 tablet (10 mg total) by mouth at bedtime.   omeprazole 20 MG capsule Commonly known as: PRILOSEC Take 1 capsule (20 mg total) by mouth daily.        Allergies  Allergen Reactions   Nsaids    Lisinopril     Cough    Consultations: Cardio    Procedures/Studies: ECHOCARDIOGRAM COMPLETE  Result Date: 06/13/2023    ECHOCARDIOGRAM REPORT   Patient Name:   [name] Date of Exam: 06/13/2023 Medical Rec #:  [id number]       Height:       63.0 in Accession #:    [id number]      Weight:       163.0 lb Date of Birth:  [birth year]       BSA:          1.773 m Patient Age:    89 years        BP:           139/61 mmHg Patient Gender: F               HR:           50 bpm. Exam Location:  [facility] Procedure: 2D Echo, Cardiac Doppler and Color Doppler Indications:     Syncope R55  History:         Patient has prior history of Echocardiogram examinations, most                  recent 06/09/2022. Risk Factors:Hypertension and Diabetes.  Sonographer:     Cristela Blue Referring Phys:  [id number]  Andris Baumann Diagnosing Phys: Lorine Bears MD IMPRESSIONS  1. Left ventricular ejection fraction, by estimation, is 55 to 60%. The left ventricle has normal function. The left ventricle has no regional wall motion abnormalities. There is mild left ventricular hypertrophy. Left ventricular diastolic parameters are indeterminate.  2. Right ventricular systolic function is  normal. The right ventricular size is normal. Tricuspid regurgitation signal is inadequate for assessing PA pressure.  3. Left atrial size was mildly dilated.  4. The mitral valve is normal in structure. Mild mitral valve regurgitation. No evidence of mitral stenosis.  5. The aortic valve is normal in structure. Aortic valve regurgitation is mild. Aortic valve sclerosis/calcification is present, without any evidence of aortic stenosis. FINDINGS  Left Ventricle: Left ventricular ejection fraction, by estimation, is 55 to 60%. The left ventricle has normal function. The left ventricle has no regional wall motion abnormalities. The left ventricular internal cavity size was normal in size. There is  mild left ventricular hypertrophy. Left ventricular diastolic parameters are indeterminate. Right Ventricle: The right ventricular size is normal. No increase in right ventricular wall thickness. Right ventricular systolic function is normal. Tricuspid regurgitation signal is inadequate for assessing PA pressure. Left Atrium: Left atrial size was mildly dilated. Right Atrium: Right atrial size was normal in size. Pericardium: There is no evidence of pericardial effusion. Mitral Valve: The mitral valve is normal in structure. There is mild thickening of the mitral valve leaflet(s). Mild mitral annular calcification. Mild mitral valve regurgitation. No evidence of mitral valve stenosis. MV peak gradient, 2.8 mmHg. The mean  mitral valve gradient is 1.0 mmHg. Tricuspid Valve: The tricuspid valve is normal in structure. Tricuspid valve regurgitation is not  demonstrated. No evidence of tricuspid stenosis. Aortic Valve: The aortic valve is normal in structure. Aortic valve regurgitation is mild. Aortic valve sclerosis/calcification is present, without any evidence of aortic stenosis. Aortic valve mean gradient measures 4.5 mmHg. Aortic valve peak gradient measures 7.8 mmHg. Aortic valve area, by VTI measures 2.22 cm. Pulmonic Valve: The pulmonic valve was normal in structure. Pulmonic valve regurgitation is not visualized. No evidence of pulmonic stenosis. Aorta: The aortic root is normal in size and structure. Venous: The inferior vena cava was not well visualized. IAS/Shunts: No atrial level shunt detected by color flow Doppler.  LEFT VENTRICLE PLAX 2D LVIDd:         4.50 cm   Diastology LVIDs:         3.10 cm   LV e' medial:    4.90 cm/s LV PW:         1.00 cm   LV E/e' medial:  14.2 LV IVS:        1.40 cm   LV e' lateral:   4.46 cm/s LVOT diam:     2.00 cm   LV E/e' lateral: 15.6 LV SV:         70 LV SV Index:   39 LVOT Area:     3.14 cm  RIGHT VENTRICLE RV Basal diam:  3.50 cm RV Mid diam:    2.60 cm LEFT ATRIUM           Index        RIGHT ATRIUM           Index LA diam:      3.80 cm 2.14 cm/m   RA Area:     14.50 cm LA Vol (A2C): 21.3 ml 12.02 ml/m  RA Volume:   36.20 ml  20.42 ml/m LA Vol (A4C): 79.4 ml 44.79 ml/m  AORTIC VALVE AV Area (Vmax):    1.83 cm AV Area (Vmean):   2.03 cm AV Area (VTI):     2.22 cm AV Vmax:           139.50 cm/s AV Vmean:  96.500 cm/s AV VTI:            0.314 m AV Peak Grad:      7.8 mmHg AV Mean Grad:      4.5 mmHg LVOT Vmax:         81.40 cm/s LVOT Vmean:        62.500 cm/s LVOT VTI:          0.222 m LVOT/AV VTI ratio: 0.71  AORTA Ao Root diam: 2.50 cm MITRAL VALVE               TRICUSPID VALVE MV Area (PHT): 2.82 cm    TR Peak grad:   8.2 mmHg MV Area VTI:   2.62 cm    TR Vmax:        143.00 cm/s MV Peak grad:  2.8 mmHg MV Mean grad:  1.0 mmHg    SHUNTS MV Vmax:       0.83 m/s    Systemic VTI:  0.22 m MV Vmean:       51.0 cm/s   Systemic Diam: 2.00 cm MV Decel Time: 269 msec MV E velocity: 69.70 cm/s MV A velocity: 74.40 cm/s MV E/A ratio:  0.94 Lorine Bears MD Electronically signed by Lorine Bears MD Signature Date/Time: 06/13/2023/1:05:56 PM    Final    CT Head Wo Contrast  Result Date: 06/12/2023 CLINICAL DATA:  Bradycardia, headaches EXAM: CT HEAD WITHOUT CONTRAST TECHNIQUE: Contiguous axial images were obtained from the base of the skull through the vertex without intravenous contrast. RADIATION DOSE REDUCTION: This exam was performed according to the departmental dose-optimization program which includes automated exposure control, adjustment of the mA and/or kV according to patient size and/or use of iterative reconstruction technique. COMPARISON:  12/27/2010 FINDINGS: Brain: No acute intracranial findings are seen. There are no signs of bleeding within the cranium. Cortical sulci are prominent. There is decreased density in periventricular white matter. Vascular: Unremarkable. Skull: No acute findings are seen. Sinuses/Orbits: Unremarkable. Other: There is increased amount of CSF instead of suggesting partial empty sella. IMPRESSION: No acute intracranial findings are seen in noncontrast CT brain. Atrophy. Small vessel disease. Electronically Signed   By: Ernie Avena M.D.   On: 06/12/2023 20:48   (Echo, Carotid, EGD, Colonoscopy, ERCP)    Subjective: Pt c/o malaise    Discharge Exam: Vitals:   06/15/23 0458 06/15/23 0757  BP: (!) 134/52 (!) 131/54  Pulse: 62 68  Resp: 16 18  Temp: 98.2 F (36.8 C) 98.4 F (36.9 C)  SpO2: 98% 96%   Vitals:   06/14/23 2018 06/15/23 0458 06/15/23 0500 06/15/23 0757  BP: (!) 127/51 (!) 134/52  (!) 131/54  Pulse: 66 62  68  Resp: 20 16  18   Temp: 99 F (37.2 C) 98.2 F (36.8 C)  98.4 F (36.9 C)  TempSrc: Oral   Oral  SpO2: 95% 98%  96%  Weight:   78.6 kg   Height:        General: Pt is alert, awake, not in acute distress Cardiovascular: S1/S2  +, no rubs, no gallops Respiratory: CTA bilaterally, no wheezing, no rhonchi Abdominal: Soft, NT, obese, bowel sounds + Extremities: no edema, no cyanosis    The results of significant diagnostics from this hospitalization (including imaging, microbiology, ancillary and laboratory) are listed below for reference.     Microbiology: No results found for this or any previous visit (from the past 240 hour(s)).   Labs: BNP (last 3 results) No results for  input(s): "BNP" in the last 8760 hours. Basic Metabolic Panel: Recent Labs  Lab 06/12/23 1540 06/12/23 2350 06/14/23 0627 06/15/23 0345  NA 136  --  138 139  K 4.1  --  4.1 4.0  CL 106  --  107 106  CO2 22  --  24 23  GLUCOSE 95  --  110* 120*  BUN 16  --  13 14  CREATININE 0.49 0.53 0.52 0.58  CALCIUM 9.2  --  8.8* 8.7*  MG 1.7  --  1.8 1.8   Liver Function Tests: No results for input(s): "AST", "ALT", "ALKPHOS", "BILITOT", "PROT", "ALBUMIN" in the last 168 hours. No results for input(s): "LIPASE", "AMYLASE" in the last 168 hours. No results for input(s): "AMMONIA" in the last 168 hours. CBC: Recent Labs  Lab 06/12/23 1540 06/12/23 2350 06/14/23 0627 06/15/23 0345  WBC 5.9 6.2 4.9 5.8  HGB 10.1* 9.4* 9.9* 10.0*  HCT 32.1* 30.0* 31.8* 31.7*  MCV 82.5 81.7 83.5 81.9  PLT 349 299 323 311   Cardiac Enzymes: No results for input(s): "CKTOTAL", "CKMB", "CKMBINDEX", "TROPONINI" in the last 168 hours. BNP: Invalid input(s): "POCBNP" CBG: Recent Labs  Lab 06/13/23 0726 06/13/23 1133 06/13/23 1627 06/14/23 1643 06/15/23 1152  GLUCAP 80 162* 89 123* 115*   D-Dimer No results for input(s): "DDIMER" in the last 72 hours. Hgb A1c No results for input(s): "HGBA1C" in the last 72 hours. Lipid Profile No results for input(s): "CHOL", "HDL", "LDLCALC", "TRIG", "CHOLHDL", "LDLDIRECT" in the last 72 hours. Thyroid function studies Recent Labs    06/13/23 0745  TSH 2.243   Anemia work up No results for input(s):  "VITAMINB12", "FOLATE", "FERRITIN", "TIBC", "IRON", "RETICCTPCT" in the last 72 hours. Urinalysis    Component Value Date/Time   COLORURINE STRAW (A) 04/12/2021 2141   APPEARANCEUR CLEAR (A) 04/12/2021 2141   LABSPEC 1.010 04/12/2021 2141   PHURINE 6.0 04/12/2021 2141   GLUCOSEU NEGATIVE 04/12/2021 2141   HGBUR NEGATIVE 04/12/2021 2141   BILIRUBINUR NEGATIVE 04/12/2021 2141   KETONESUR NEGATIVE 04/12/2021 2141   PROTEINUR NEGATIVE 04/12/2021 2141   UROBILINOGEN 0.2 08/19/2013 1134   NITRITE NEGATIVE 04/12/2021 2141   LEUKOCYTESUR NEGATIVE 04/12/2021 2141   Sepsis Labs Recent Labs  Lab 06/12/23 1540 06/12/23 2350 06/14/23 0627 06/15/23 0345  WBC 5.9 6.2 4.9 5.8   Microbiology No results found for this or any previous visit (from the past 240 hour(s)).   Time coordinating discharge: Over 30 minutes  SIGNED:   Charise Killian, MD  [facility] 06/15/2023, 12:40 PM Pager   If 7PM-7AM, please contact night-coverage [email or web address]

## 2023-06-15 NOTE — TOC Transition Note (Signed)
Transition of Care [facility]) - CM/SW Discharge Note   Patient Details  Name: [name] MRN: [id number] Date of Birth: 12-Aug-1932  Transition of Care [facility]) CM/SW Contact:  Margarito Liner, LCSW Phone Number: 06/15/2023, 12:51 PM   Clinical Narrative: Patient has orders to discharge home today. Left message for [facility] liaison to notify. No further concerns. CSW signing off.    Final next level of care: Home w Home Health Services Barriers to Discharge: Barriers Resolved   Patient Goals and CMS Choice CMS Medicare.gov Compare Post Acute Care list provided to:: Patient Choice offered to / list presented to : Patient  Discharge Placement                  Patient to be transferred to facility by: Daughter   Patient and family notified of of transfer: 06/15/23  Discharge Plan and Services Additional resources added to the After Visit Summary for       Post Acute Care Choice: Home Health                    HH Arranged: RN, PT [facility] Agency: Other - See comment [facility]) Date [facility] Agency Contacted: 06/15/23 Time HH Agency Contacted: [phone] Representative spoke with at [facility] Agency: Crist Infante  Social Determinants of Health (SDOH) Interventions SDOH Screenings   Food Insecurity: No Food Insecurity (06/14/2023)  Housing: Low Risk  (06/14/2023)  Transportation Needs: No Transportation Needs (06/14/2023)  Utilities: Not At Risk (06/14/2023)  Alcohol Screen: Low Risk  (08/18/2021)  Depression (PHQ2-9): Low Risk  (05/19/2023)  Financial Resource Strain: Low Risk  (08/26/2022)  Physical Activity: Inactive (08/18/2021)  Social Connections: Moderately Isolated (08/26/2022)  Stress: No Stress Concern Present (08/26/2022)  Tobacco Use: Low Risk  (06/12/2023)     Readmission Risk Interventions     No data to display

## 2023-06-15 NOTE — Progress Notes (Signed)
Physical Therapy Treatment Patient Details Name: [name] MRN: [id number] DOB: [birth year] Today's Date: 06/15/2023   History of Present Illness Pt admitted for presyncope with complaints of bradycardia. Pt with history of aortic and mitral valve regurgitation, HTN, and DM. Episode of bradycardia while working with HHPT    PT Comments  Patient is agreeable to PT. Patient continues to require assistance for standing from bed. Activity tolerance limited by L knee pain. Unable to progress walking with loss of balance with any dynamic standing activity. Anticipate patient will continue to require frequent physical assistance at discharge. PT will continue to follow.     Assistance Recommended at Discharge Frequent or constant Supervision/Assistance  If plan is discharge home, recommend the following:  Can travel by private vehicle    Two people to help with walking and/or transfers;Two people to help with bathing/dressing/bathroom;Help with stairs or ramp for entrance   No  Equipment Recommendations       Recommendations for Other Services       Precautions / Restrictions Precautions Precautions: Fall Restrictions Weight Bearing Restrictions: No     Mobility  Bed Mobility Overal bed mobility: Needs Assistance Bed Mobility: Supine to Sit, Sit to Supine     Supine to sit: Max assist, +2 for physical assistance Sit to supine: Max assist, +2 for physical assistance   General bed mobility comments: increased time and effort required. limited by L knee pain. cues for sequencing and technique    Transfers Overall transfer level: Needs assistance Equipment used: Rolling walker (2 wheels) Transfers: Sit to/from Stand Sit to Stand: Max assist, +2 physical assistance, From elevated surface           General transfer comment: 3 standing bouts performed. +2 assistance reuqired for first 2 stands and CGA for second stand. cues for anterior weight shifting and techniques for  decreased weight bearing on LLE due to pain. rest breaks required between bouts of standing    Ambulation/Gait             Pre-gait activities: weight shifting faciliation provided. loss of balance posteriorly with any attempts to take side steps General Gait Details: unable to due to poor standing tolerance   Stairs             Wheelchair Mobility     Tilt Bed    Modified Rankin (Stroke Patients Only)       Balance Overall balance assessment: Needs assistance Sitting-balance support: Feet supported Sitting balance-Leahy Scale: Fair     Standing balance support: Bilateral upper extremity supported Standing balance-Leahy Scale: Poor Standing balance comment: external support required. patient relying heavily on the bed for posterior leg support. loss of balance posteriorly with dynamic activity                            Cognition Arousal/Alertness: Awake/alert Behavior During Therapy: WFL for tasks assessed/performed Overall Cognitive Status: Within Functional Limits for tasks assessed                                          Exercises      General Comments        Pertinent Vitals/Pain Pain Assessment Pain Assessment: Faces Faces Pain Scale: Hurts whole lot Pain Location: left knee Pain Descriptors / Indicators: Discomfort, Grimacing, Guarding Pain Intervention(s): Limited activity within patient's tolerance, Monitored  during session, Repositioned    Home Living                          Prior Function            PT Goals (current goals can now be found in the care plan section) Acute Rehab PT Goals Patient Stated Goal: to fix her knee PT Goal Formulation: With patient Time For Goal Achievement: 06/27/23 Potential to Achieve Goals: Good Progress towards PT goals: Progressing toward goals    Frequency    Min 1X/week      PT Plan Current plan remains appropriate    Co-evaluation               AM-PAC PT "6 Clicks" Mobility   Outcome Measure  Help needed turning from your back to your side while in a flat bed without using bedrails?: A Lot Help needed moving from lying on your back to sitting on the side of a flat bed without using bedrails?: A Lot Help needed moving to and from a bed to a chair (including a wheelchair)?: Total Help needed standing up from a chair using your arms (e.g., wheelchair or bedside chair)?: A Lot Help needed to walk in hospital room?: Total Help needed climbing 3-5 steps with a railing? : Total 6 Click Score: 9    End of Session   Activity Tolerance: Patient limited by pain Patient left: in bed;with call bell/phone within reach;with bed alarm set (in semi-chair position)   PT Visit Diagnosis: Unsteadiness on feet (R26.81);Muscle weakness (generalized) (M62.81);Difficulty in walking, not elsewhere classified (R26.2);Pain Pain - Right/Left: Left Pain - part of body: Knee     Time: 5284-1324 PT Time Calculation (min) (ACUTE ONLY): 28 min  Charges:    $Therapeutic Activity: 23-37 mins PT General Charges $$ ACUTE PT VISIT: 1 Visit                     Donna Bernard, PT, MPT    Ina Homes 06/15/2023, 1:54 PM

## 2023-06-15 NOTE — Plan of Care (Signed)
  Problem: Education: Goal: Ability to describe self-care measures that may prevent or decrease complications (Diabetes Survival Skills Education) will improve Outcome: Progressing Goal: Individualized Educational Video(s) Outcome: Progressing   Problem: Coping: Goal: Ability to adjust to condition or change in health will improve Outcome: Progressing   Problem: Fluid Volume: Goal: Ability to maintain a balanced intake and output will improve Outcome: Progressing   Problem: Health Behavior/Discharge Planning: Goal: Ability to identify and utilize available resources and services will improve Outcome: Progressing Goal: Ability to manage health-related needs will improve Outcome: Progressing   Problem: Metabolic: Goal: Ability to maintain appropriate glucose levels will improve Outcome: Progressing   Problem: Nutritional: Goal: Maintenance of adequate nutrition will improve Outcome: Progressing Goal: Progress toward achieving an optimal weight will improve Outcome: Progressing   Problem: Skin Integrity: Goal: Risk for impaired skin integrity will decrease Outcome: Progressing   Problem: Tissue Perfusion: Goal: Adequacy of tissue perfusion will improve Outcome: Progressing   Problem: Education: Goal: Knowledge of condition and prescribed therapy will improve Outcome: Progressing   Problem: Cardiac: Goal: Will achieve and/or maintain adequate cardiac output Outcome: Progressing   Problem: Physical Regulation: Goal: Complications related to the disease process, condition or treatment will be avoided or minimized Outcome: Progressing   Problem: Education: Goal: Knowledge of General Education information will improve Description: Including pain rating scale, medication(s)/side effects and non-pharmacologic comfort measures Outcome: Progressing   Problem: Health Behavior/Discharge Planning: Goal: Ability to manage health-related needs will improve Outcome:  Progressing   Problem: Clinical Measurements: Goal: Ability to maintain clinical measurements within normal limits will improve Outcome: Progressing Goal: Will remain free from infection Outcome: Progressing Goal: Diagnostic test results will improve Outcome: Progressing Goal: Respiratory complications will improve Outcome: Progressing Goal: Cardiovascular complication will be avoided Outcome: Progressing   Problem: Activity: Goal: Risk for activity intolerance will decrease Outcome: Progressing   Problem: Nutrition: Goal: Adequate nutrition will be maintained Outcome: Progressing   Problem: Coping: Goal: Level of anxiety will decrease Outcome: Progressing   Problem: Elimination: Goal: Will not experience complications related to bowel motility Outcome: Progressing Goal: Will not experience complications related to urinary retention Outcome: Progressing   Problem: Pain Managment: Goal: General experience of comfort will improve Outcome: Progressing   Problem: Safety: Goal: Ability to remain free from injury will improve Outcome: Progressing   Problem: Skin Integrity: Goal: Risk for impaired skin integrity will decrease Outcome: Progressing

## 2023-06-15 NOTE — Plan of Care (Signed)
  Problem: Coping: Goal: Ability to adjust to condition or change in health will improve Outcome: Progressing   Problem: Fluid Volume: Goal: Ability to maintain a balanced intake and output will improve Outcome: Progressing   Problem: Nutritional: Goal: Maintenance of adequate nutrition will improve Outcome: Progressing   Problem: Education: Goal: Knowledge of condition and prescribed therapy will improve Outcome: Progressing

## 2023-06-15 NOTE — Progress Notes (Signed)
Progress Note  Patient Name: [name] Date of Encounter: 06/15/2023  Primary Cardiologist: End  Subjective   No chest pain, dyspnea, dizziness, presyncope, or syncope. Heart rates in the 40s to 60s bpm with occasional PVCs. BP improved overall following addition of irbesartan.   Inpatient Medications    Scheduled Meds:  amLODipine  10 mg Oral Daily   enoxaparin (LOVENOX) injection  40 mg Subcutaneous Q24H   irbesartan  150 mg Oral Daily   sodium chloride flush  3 mL Intravenous Q12H   Continuous Infusions:  PRN Meds: acetaminophen **OR** acetaminophen, ondansetron **OR** ondansetron (ZOFRAN) IV, traMADol   Vital Signs    Vitals:   06/14/23 2018 06/15/23 0458 06/15/23 0500 06/15/23 0757  BP: (!) 127/51 (!) 134/52  (!) 131/54  Pulse: 66 62  68  Resp: 20 16  18   Temp: 99 F (37.2 C) 98.2 F (36.8 C)  98.4 F (36.9 C)  TempSrc: Oral   Oral  SpO2: 95% 98%  96%  Weight:   78.6 kg   Height:       No intake or output data in the 24 hours ending 06/15/23 1022 Filed Weights   06/12/23 1531 06/15/23 0500  Weight: 73.9 kg 78.6 kg    Telemetry    Sinus bradycardia, 40s to 50s bpm with occasional PVCs, brief heart rate trend to the 70s to 80s bpm - Personally Reviewed  ECG    No new tracings - Personally Reviewed  Physical Exam   GEN: No acute distress.   Neck: No JVD. Cardiac: RRR, no murmurs, rubs, or gallops.  Respiratory: Clear to auscultation bilaterally.  GI: Soft, nontender, non-distended.   MS: No edema; No deformity. Neuro:  Alert and oriented x 3; Nonfocal.  Psych: Normal affect.  Labs    Chemistry Recent Labs  Lab 06/12/23 1540 06/12/23 2350 06/14/23 0627 06/15/23 0345  NA 136  --  138 139  K 4.1  --  4.1 4.0  CL 106  --  107 106  CO2 22  --  24 23  GLUCOSE 95  --  110* 120*  BUN 16  --  13 14  CREATININE 0.49 0.53 0.52 0.58  CALCIUM 9.2  --  8.8* 8.7*  GFRNONAA >60 >60 >60 >60  ANIONGAP 8  --  7 10     Hematology Recent  Labs  Lab 06/12/23 2350 06/14/23 0627 06/15/23 0345  WBC 6.2 4.9 5.8  RBC 3.67* 3.81* 3.87  HGB 9.4* 9.9* 10.0*  HCT 30.0* 31.8* 31.7*  MCV 81.7 83.5 81.9  MCH 25.6* 26.0 25.8*  MCHC 31.3 31.1 31.5  RDW 17.1* 17.0* 17.0*  PLT 299 323 311    Cardiac EnzymesNo results for input(s): "TROPONINI" in the last 168 hours. No results for input(s): "TROPIPOC" in the last 168 hours.   BNPNo results for input(s): "BNP", "PROBNP" in the last 168 hours.   DDimer No results for input(s): "DDIMER" in the last 168 hours.   Radiology    CT Head Wo Contrast  Result Date: 06/12/2023 IMPRESSION: No acute intracranial findings are seen in noncontrast CT brain. Atrophy. Small vessel disease. Electronically Signed   By: Ernie Avena M.D.   On: 06/12/2023 20:48    Cardiac Studies   2D echo 06/13/2023: 1. Left ventricular ejection fraction, by estimation, is 55 to 60%. The  left ventricle has normal function. The left ventricle has no regional  wall motion abnormalities. There is mild left ventricular hypertrophy.  Left ventricular  diastolic parameters  are indeterminate.   2. Right ventricular systolic function is normal. The right ventricular  size is normal. Tricuspid regurgitation signal is inadequate for assessing  PA pressure.   3. Left atrial size was mildly dilated.   4. The mitral valve is normal in structure. Mild mitral valve  regurgitation. No evidence of mitral stenosis.   5. The aortic valve is normal in structure. Aortic valve regurgitation is  mild. Aortic valve sclerosis/calcification is present, without any  evidence of aortic stenosis.   Patient Profile     89 y.o. female with history of aortic and mitral valve regurgitation, NSVT, PSVT, PVCs, hypertension, type 2 diabetes mellitus, first degree AV block, LBBB, and normocytic anemia who was admitted with intermittent headache over the last few days as well as generalized weakness  and we are seeing for bradycardia.    Assessment & Plan    1. Sinus bradycardia: -Admitted with intermittent headache and generalized weakness -Bradycardia unlikely to be contributing to headache -Metoprolol stopped, last received Lopressor 12.5 mg on 7/23 at 21:36 -No indication for venous temp wire or PPM at this time -Monitor atrial and ventricular ectopy burden -Plan for outpatient EP evaluation  -Magnesium and potassium normal -TSH normal  2. Valvular heart disease: -Mild mitral and aortic regurgitation by echo this admission -Outpatient follow up   3. PVCs,PSVT, NSVT: -Occasional isolated PVCs noted on tele -Monitor off beta blocker -Outpatient follow up with EP  4. HTN: -Blood pressure has overall been reasonably controlled with rare elevated readings in the 180s to 190s mmHg systolic -Would allow for some degree of permissive hypertension with bradycardia noted -Amlodipine 10 mg and irbesartan 150 mg -Avoid AV nodal blocking medications     For questions or updates, please contact [facility] Please consult [email or web address] for contact info under Cardiology/STEMI.    Signed, Eula Listen, PA-C [facility] Pager: [phone] 06/15/2023, 10:22 AM

## 2023-06-15 NOTE — Progress Notes (Addendum)
[name] ([id number]) [id number] Page 1 of 6 Visit Report for 06/09/2023 Chief Complaint Document Details Patient Name: Date of Service: [place], [place] [name] 06/09/2023 12:00 PM Medical Record Number: [id number] Patient Account Number: [id number] Date of Birth/Sex: Treating RN: September 06, 1932 (89 y.o. [name] Primary Care Provider: Elizabeth Sauer Other Clinician: Referring Provider: Treating Provider/Extender: Althia Forts Weeks in Treatment: 2 Information Obtained from: Patient Chief Complaint Bilateral buttock ulcers Electronic Signature(s) Signed: 06/19/2023 8:54:51 AM By: Allen Derry PA-C Entered By: Allen Derry on 06/19/2023 08:54:51 -------------------------------------------------------------------------------- HPI Details Patient Name: Date of Service: Madison LBERT, [name] 06/09/2023 12:00 PM Medical Record Number: [id number] Patient Account Number: [id number] Date of Birth/Sex: Treating RN: [birth year] (89 y.o. [name] Primary Care Provider: Elizabeth Sauer Other Clinician: Referring Provider: Treating Provider/Extender: Althia Forts Weeks in Treatment: 2 History of Present Illness HPI Description: 03-04-2022 upon evaluation today patient appears to be doing poorly in regard to her gluteal region. Its mainly in the left gluteal region where there is an open ulceration at this point. This does appear to be a stage III pressure ulcer. Fortunately there does not appear to be any evidence of active infection at this site locally nor systemically which is also news. The patient has been counseled by her daughter who is present with her today her son was also on the phone we had a good discussion at this point. Nonetheless they have been trying to get her to not sit as long as they knew that was the major issue the patient tells me however she has a hard time getting up because of her knee. She has had of extensive work-up  and treatment for the knee and fortunately has been determined this is just arthritis and there is really not much more that they can do. Nonetheless I think she is going to have to get up and move around or at least get up and stand and allow for blood flow into the gluteal area or out she is can end up with more significant wounds than what we have and see right now. Patient does have a history of diabetes mellitus type 2, hypertension, and coronary artery disease. This wound has been present for at this point roughly 1 month beginning on or around February 04, 2022. 03-21-2022 upon evaluation today patient's wound actually showing signs of excellent improvement I am very pleased with where we stand today there does not appear to be any signs of infection which is great news and overall I think we are headed in the right direction. This is not terribly smaller but it is also looking better than what it was as far as the health of the surface of the wound is concerned. 04-15-2022 upon evaluation today patient appears to be doing well in regard to 1 side of her gluteal area not as well and the other that was healed last time. The [name] ([id number]) [id number] Page 2 of 6 right side is reopened which was previously closed. The left side is not as bad and in fact is very close to complete closure. Fortunately I do not see any evidence of active infection locally or systemically at this time which is great news. Overall I think that we are headed in the right direction but still regular probably need to put something on both sides to protect at this point and try to get these areas healed. 04-29-2022 upon evaluation today patient appears to be doing  well with regard to her wounds both are shown signs of improvement and very pleased in that regard. I do not see evidence of infection locally or systemically currently. 05-13-2022 upon evaluation today patient's wounds are  showing signs of some improvement though she does have some need for sharp debridement today as well. I did have a pretty lengthy discussion with her about what causes these wounds and how to prevent them. She voiced understanding I know she has a hard time standing up and moving around but even if she can just stand at the side of her bed this would be something. She voiced understanding. 05-27-2022 upon evaluation today patient appears to be doing okay in regard to her wound. Fortunately there does not appear to be any signs of active infection locally or systemically at this time which is great news. No fevers, chills, nausea, vomiting, or diarrhea. With that being said both wounds are still open but unfortunately she is having a lot of issues with discomfort and the dressing just are not staying in place. Fortunately I do not see again in the significant drainage which is good news. 06-10-2022 upon evaluation today patient appears to be doing better in regard to her wounds. They have been using the Desitin and they do feel like this is doing quite well. Fortunately I do not see any evidence of active infection locally or systemically at this time. No fevers, chills, nausea, vomiting, or diarrhea. 07-08-2022 upon evaluation today patient appears to be doing well currently in regard to her wound in the gluteal region. In fact this appears to be completely healed based on what I am seeing in. Fortunately I do not see any signs of active infection at this time which is great news. Readmission: 05-23-2023 upon evaluation today patient appears to be doing poorly currently in regard to her gluteal region. This is a patient whom I am familiar with I did see her last year from roughly April through what we healed her in August of that time. With that being said she has similar issues right now going on where she does have breakdown in the bilateral gluteal regions. She does upon further questioning sleep in a  recliner which is not optimal and not something that we discussed previous we discussed that again today. She also spends majority of her day sitting up in either wheelchair or in the recliner. Subsequently I think that this is a major culprit for why this is breaking down. Patient's past medical history really has not changed significantly she continues to have diabetes, hypertension, and history of coronary artery disease. 06-02-2023 upon evaluation today patient appears to be doing well currently in regard to her wounds. She was unable to keep any dressings in place so they just transition to using the Calmoseptine which has been beneficial. Fortunately I am not seeing anything that appears to be worse at this point which is great news. 06-09-2023 this is a note that is being put in late secondary to the fact that computers were down across the world due to a [employer] outage. Therefore there may be some lack of detail compared to what would normally be present in the notes. With that being said patient's wound actually today appears to be completely healed based on what I am seeing in the gluteal region. In fact this appears to be much more dry than what I would like to see at this point. I do believe that we may want to switch to AandD  ointment and I discussed options for going back and forth between that and the Calmoseptine to panel how she is doing whether to dry her too moist. Electronic Signature(s) Signed: 06/19/2023 8:55:23 AM By: Allen Derry PA-C Entered By: Allen Derry on 06/19/2023 08:55:23 -------------------------------------------------------------------------------- Physical Exam Details Patient Name: Date of Service: Madison LBERT, [name] 06/09/2023 12:00 PM Medical Record Number: [id number] Patient Account Number: [id number] Date of Birth/Sex: Treating RN: 02-20-1932 (89 y.o. [name] Primary Care Provider: Elizabeth Sauer Other Clinician: Referring Provider: Treating  Provider/Extender: Althia Forts Weeks in Treatment: 2 Constitutional Well-nourished and well-hydrated in no acute distress. Respiratory normal breathing without difficulty. Psychiatric this patient is able to make decisions and demonstrates good insight into disease process. Alert and Oriented x 3. pleasant and cooperative. Notes Upon inspection patient's wound bed actually showed signs of good granulation epithelization at this point. Fortunately I do not see any signs of active infection at this time. Fortunately I do not feel like that the patient is really shown anything truly open which is good news as well and I would recommend that we go ahead and discontinue wound care services at this point. BRIGITTE, SODERBERG ([id number]) [id number] Page 3 of 6 Electronic Signature(s) Signed: 06/19/2023 8:55:45 AM By: Allen Derry PA-C Entered By: Allen Derry on 06/19/2023 08:55:45 -------------------------------------------------------------------------------- Physician Orders Details Patient Name: Date of Service: Madison LBERT, [name] 06/09/2023 12:00 PM Medical Record Number: [id number] Patient Account Number: [id number] Date of Birth/Sex: Treating RN: 1932/09/05 (89 y.o. [name] Primary Care Provider: Elizabeth Sauer Other Clinician: Betha Loa Referring Provider: Treating Provider/Extender: Althia Forts Weeks in Treatment: 2 Verbal / Phone Orders: No Diagnosis Coding Discharge From [facility]charge from [facility] Treatment Complete Electronic Signature(s) Signed: 06/12/2023 11:00:14 AM By: Elliot Gurney, BSN, RN, CWS, Kim RN, BSN Signed: 06/15/2023 4:06:59 PM By: Allen Derry PA-C Entered By: Elliot Gurney BSN, RN, CWS, Kim on 06/12/2023 11:00:14 -------------------------------------------------------------------------------- Problem List Details Patient Name: Date of Service: Jodell Cipro, [name] 06/09/2023 12:00 PM Medical  Record Number: [id number] Patient Account Number: [id number] Date of Birth/Sex: Treating RN: 06/13/1932 (89 y.o. [name] Primary Care Provider: Elizabeth Sauer Other Clinician: Referring Provider: Treating Provider/Extender: Althia Forts Weeks in Treatment: 2 Active Problems ICD-10 Encounter Code Description Active Date MDM Diagnosis [phone] Pressure ulcer of left buttock, stage 3 05/23/2023 No Yes L89.312 Pressure ulcer of right buttock, stage 2 05/23/2023 No Yes CHERILYN, SAUTTER ([id number]) [id number] Page 4 of 6 E11.622 Type 2 diabetes mellitus with other skin ulcer 05/23/2023 No Yes I10 Essential (primary) hypertension 05/23/2023 No Yes I25.10 Atherosclerotic heart disease of native coronary artery without angina pectoris 05/23/2023 No Yes Inactive Problems Resolved Problems Electronic Signature(s) Signed: 06/19/2023 8:54:46 AM By: Allen Derry PA-C Entered By: Allen Derry on 06/19/2023 08:54:46 -------------------------------------------------------------------------------- Progress Note Details Patient Name: Date of Service: Madison LBERT, [name] 06/09/2023 12:00 PM Medical Record Number: [id number] Patient Account Number: [id number] Date of Birth/Sex: Treating RN: 01/30/1932 (89 y.o. [name] Primary Care Provider: Elizabeth Sauer Other Clinician: Referring Provider: Treating Provider/Extender: Althia Forts Weeks in Treatment: 2 Subjective Chief Complaint Information obtained from Patient Bilateral buttock ulcers History of Present Illness (HPI) 03-04-2022 upon evaluation today patient appears to be doing poorly in regard to her gluteal region. Its mainly in the left gluteal region where there is an open ulceration at this point. This does appear to be a stage III pressure ulcer. Fortunately there  does not appear to be any evidence of active infection at this site locally nor systemically which is also news. The patient has  been counseled by her daughter who is present with her today her son was also on the phone we had a good discussion at this point. Nonetheless they have been trying to get her to not sit as long as they knew that was the major issue the patient tells me however she has a hard time getting up because of her knee. She has had of extensive work-up and treatment for the knee and fortunately has been determined this is just arthritis and there is really not much more that they can do. Nonetheless I think she is going to have to get up and move around or at least get up and stand and allow for blood flow into the gluteal area or out she is can end up with more significant wounds than what we have and see right now. Patient does have a history of diabetes mellitus type 2, hypertension, and coronary artery disease. This wound has been present for at this point roughly 1 month beginning on or around February 04, 2022. 03-21-2022 upon evaluation today patient's wound actually showing signs of excellent improvement I am very pleased with where we stand today there does not appear to be any signs of infection which is great news and overall I think we are headed in the right direction. This is not terribly smaller but it is also looking better than what it was as far as the health of the surface of the wound is concerned. 04-15-2022 upon evaluation today patient appears to be doing well in regard to 1 side of her gluteal area not as well and the other that was healed last time. The right side is reopened which was previously closed. The left side is not as bad and in fact is very close to complete closure. Fortunately I do not see any evidence of active infection locally or systemically at this time which is great news. Overall I think that we are headed in the right direction but still regular probably need to put something on both sides to protect at this point and try to get these areas healed. 04-29-2022 upon  evaluation today patient appears to be doing well with regard to her wounds both are shown signs of improvement and very pleased in that regard. I do not see evidence of infection locally or systemically currently. 05-13-2022 upon evaluation today patient's wounds are showing signs of some improvement though she does have some need for sharp debridement today as well. I did have a pretty lengthy discussion with her about what causes these wounds and how to prevent them. She voiced understanding I know she has a hard time standing up and moving around but even if she can just stand at the side of her bed this would be something. She voiced understanding. 05-27-2022 upon evaluation today patient appears to be doing okay in regard to her wound. Fortunately there does not appear to be any signs of active infection locally or systemically at this time which is great news. No fevers, chills, nausea, vomiting, or diarrhea. With that being said both wounds are still open but CIIN, BRAZZEL ([id number]) [id number] Page 5 of 6 unfortunately she is having a lot of issues with discomfort and the dressing just are not staying in place. Fortunately I do not see again in the significant drainage which is good news. 06-10-2022 upon evaluation  today patient appears to be doing better in regard to her wounds. They have been using the Desitin and they do feel like this is doing quite well. Fortunately I do not see any evidence of active infection locally or systemically at this time. No fevers, chills, nausea, vomiting, or diarrhea. 07-08-2022 upon evaluation today patient appears to be doing well currently in regard to her wound in the gluteal region. In fact this appears to be completely healed based on what I am seeing in. Fortunately I do not see any signs of active infection at this time which is great news. Readmission: 05-23-2023 upon evaluation today patient appears to be doing poorly  currently in regard to her gluteal region. This is a patient whom I am familiar with I did see her last year from roughly April through what we healed her in August of that time. With that being said she has similar issues right now going on where she does have breakdown in the bilateral gluteal regions. She does upon further questioning sleep in a recliner which is not optimal and not something that we discussed previous we discussed that again today. She also spends majority of her day sitting up in either wheelchair or in the recliner. Subsequently I think that this is a major culprit for why this is breaking down. Patient's past medical history really has not changed significantly she continues to have diabetes, hypertension, and history of coronary artery disease. 06-02-2023 upon evaluation today patient appears to be doing well currently in regard to her wounds. She was unable to keep any dressings in place so they just transition to using the Calmoseptine which has been beneficial. Fortunately I am not seeing anything that appears to be worse at this point which is great news. 06-09-2023 this is a note that is being put in late secondary to the fact that computers were down across the world due to a [employer] outage. Therefore there may be some lack of detail compared to what would normally be present in the notes. With that being said patient's wound actually today appears to be completely healed based on what I am seeing in the gluteal region. In fact this appears to be much more dry than what I would like to see at this point. I do believe that we may want to switch to AandD ointment and I discussed options for going back and forth between that and the Calmoseptine to panel how she is doing whether to dry her too moist. Objective Constitutional Well-nourished and well-hydrated in no acute distress. Vitals Time Taken: 12:00 PM, Height: 63 in, Weight: 163 lbs, BMI: 28.9, Temperature: 98 F,  Pulse: 60 bpm, Respiratory Rate: 16 breaths/min, Blood Pressure: 146/60 mmHg. Respiratory normal breathing without difficulty. Psychiatric this patient is able to make decisions and demonstrates good insight into disease process. Alert and Oriented x 3. pleasant and cooperative. General Notes: Upon inspection patient's wound bed actually showed signs of good granulation epithelization at this point. Fortunately I do not see any signs of active infection at this time. Fortunately I do not feel like that the patient is really shown anything truly open which is good news as well and I would recommend that we go ahead and discontinue wound care services at this point. Integumentary (Hair, Skin) Wound #3 status is Healed - Epithelialized. Original cause of wound was Pressure Injury. The date acquired was: 04/22/2023. The wound has been in treatment 2 weeks. The wound is located on the Left Gluteus. The  wound measures 0cm length x 0cm width x 0cm depth; 0cm^2 area and 0cm^3 volume. There is a medium amount of serosanguineous drainage noted. Wound #4 status is Healed - Epithelialized. Original cause of wound was Pressure Injury. The date acquired was: 04/22/2023. The wound has been in treatment 2 weeks. The wound is located on the Right Gluteus. The wound measures 0cm length x 0cm width x 0cm depth; 0cm^2 area and 0cm^3 volume. There is a medium amount of serosanguineous drainage noted. Assessment Active Problems ICD-10 Pressure ulcer of left buttock, stage 3 Pressure ulcer of right buttock, stage 2 Type 2 diabetes mellitus with other skin ulcer Essential (primary) hypertension Atherosclerotic heart disease of native coronary artery without angina pectoris Plan Discharge From [facility] Services: Discharge from [facility] Treatment Complete [name], [name] ([id number]) [id number] Page 6 of 6 1. Based on what I see I do believe that the patient would benefit from having a  plan going forward of alternating the dressings or rather the creams as necessary depending on how the skin integrity is. If it is too moist then the Calmoseptine would be the best way to go. If it is too dry AandD ointment would be the way to go right now recommend the AandD ointment. 2. I would recommend continuing appropriate offloading I think this is still something that needs to be taking care of and monitor closely and therefore the offloading this may be of utmost importance. We will see patient back for reevaluation in 1 week here in the clinic. If anything worsens or changes patient will contact our office for additional recommendations. Electronic Signature(s) Signed: 06/19/2023 8:56:26 AM By: Allen Derry PA-C Entered By: Allen Derry on 06/19/2023 08:56:26

## 2023-06-17 DIAGNOSIS — E119 Type 2 diabetes mellitus without complications: Secondary | ICD-10-CM | POA: Diagnosis not present

## 2023-06-17 DIAGNOSIS — I350 Nonrheumatic aortic (valve) stenosis: Secondary | ICD-10-CM | POA: Diagnosis not present

## 2023-06-17 DIAGNOSIS — L89322 Pressure ulcer of left buttock, stage 2: Secondary | ICD-10-CM | POA: Diagnosis not present

## 2023-06-17 DIAGNOSIS — G894 Chronic pain syndrome: Secondary | ICD-10-CM | POA: Diagnosis not present

## 2023-06-17 DIAGNOSIS — I119 Hypertensive heart disease without heart failure: Secondary | ICD-10-CM | POA: Diagnosis not present

## 2023-06-17 DIAGNOSIS — I872 Venous insufficiency (chronic) (peripheral): Secondary | ICD-10-CM | POA: Diagnosis not present

## 2023-06-20 DIAGNOSIS — I119 Hypertensive heart disease without heart failure: Secondary | ICD-10-CM | POA: Diagnosis not present

## 2023-06-20 DIAGNOSIS — I872 Venous insufficiency (chronic) (peripheral): Secondary | ICD-10-CM | POA: Diagnosis not present

## 2023-06-20 DIAGNOSIS — G894 Chronic pain syndrome: Secondary | ICD-10-CM | POA: Diagnosis not present

## 2023-06-20 DIAGNOSIS — I350 Nonrheumatic aortic (valve) stenosis: Secondary | ICD-10-CM | POA: Diagnosis not present

## 2023-06-20 DIAGNOSIS — L89322 Pressure ulcer of left buttock, stage 2: Secondary | ICD-10-CM | POA: Diagnosis not present

## 2023-06-20 DIAGNOSIS — E119 Type 2 diabetes mellitus without complications: Secondary | ICD-10-CM | POA: Diagnosis not present

## 2023-06-22 DIAGNOSIS — M170 Bilateral primary osteoarthritis of knee: Secondary | ICD-10-CM | POA: Diagnosis not present

## 2023-06-26 DIAGNOSIS — L89322 Pressure ulcer of left buttock, stage 2: Secondary | ICD-10-CM | POA: Diagnosis not present

## 2023-06-26 DIAGNOSIS — I872 Venous insufficiency (chronic) (peripheral): Secondary | ICD-10-CM | POA: Diagnosis not present

## 2023-06-26 DIAGNOSIS — E119 Type 2 diabetes mellitus without complications: Secondary | ICD-10-CM | POA: Diagnosis not present

## 2023-06-26 DIAGNOSIS — G894 Chronic pain syndrome: Secondary | ICD-10-CM | POA: Diagnosis not present

## 2023-06-26 DIAGNOSIS — I350 Nonrheumatic aortic (valve) stenosis: Secondary | ICD-10-CM | POA: Diagnosis not present

## 2023-06-26 DIAGNOSIS — I119 Hypertensive heart disease without heart failure: Secondary | ICD-10-CM | POA: Diagnosis not present

## 2023-06-28 ENCOUNTER — Ambulatory Visit: Payer: Medicare Other | Attending: Cardiology | Admitting: Cardiology

## 2023-06-28 ENCOUNTER — Encounter: Payer: Self-pay | Admitting: Cardiology

## 2023-06-28 VITALS — BP 133/73 | HR 75 | Ht 63.0 in | Wt 163.0 lb

## 2023-06-28 DIAGNOSIS — I38 Endocarditis, valve unspecified: Secondary | ICD-10-CM

## 2023-06-28 DIAGNOSIS — I10 Essential (primary) hypertension: Secondary | ICD-10-CM | POA: Diagnosis not present

## 2023-06-28 DIAGNOSIS — I471 Supraventricular tachycardia, unspecified: Secondary | ICD-10-CM | POA: Diagnosis not present

## 2023-06-28 DIAGNOSIS — I4729 Other ventricular tachycardia: Secondary | ICD-10-CM | POA: Diagnosis not present

## 2023-06-28 DIAGNOSIS — R001 Bradycardia, unspecified: Secondary | ICD-10-CM | POA: Diagnosis not present

## 2023-06-28 MED ORDER — AMLODIPINE BESYLATE 10 MG PO TABS: 10.0000 mg | ORAL_TABLET | Freq: Every day | ORAL | 3 refills | Status: DC

## 2023-06-28 MED ORDER — IRBESARTAN 150 MG PO TABS: 150.0000 mg | ORAL_TABLET | Freq: Every day | ORAL | 3 refills | Status: DC

## 2023-06-28 NOTE — Progress Notes (Signed)
Cardiology Office Note:  .   Date:  06/28/2023  ID:  [name], DOB June 08, 1932, MRN [id number] PCP: Duanne Limerick, MD  [facility] Providers Cardiologist:  Yvonne Kendall, MD Electrophysiologist:  Lanier Prude, MD    History of Present Illness: .   [name] is a 89 y.o. female with a past medical history of aortic and mitral valve regurgitation, NSVT, PSVT, hypertension, type 2 diabetes, history of bradycardia, who has been seen today for hospital follow-up.  Echo completed in 2019 showed EF 65 to 70%, G1 DD, mild cardiomyopathy.  Previous cardiac monitor 19 showed heart rate 43-1.2 with occasional PACs and PVCs, brief episodes of SVT and nonsustained VT noted.  No sustained arrhythmias or prolonged pauses.  Heart monitor in May 2022 showed normal sinus rhythm with an average of 64 bpm, branch block, and IVCD present, 6 episodes of SVT, fastest of which lasted 4 beats.  Echocardiogram November 2022 showed LVEF of 60 to 65%, no R WMA, G1 DD, moderate TR, mild AR.  The medication was avoided due to baseline bradycardia.  She was referred to EP who started her on metoprolol 25 mg twice daily.  She presented to the emergency department 06/12/23 for low heart rate.  She was doing physical therapy for her knees and the therapist noted heart rates in the 40s.  She was recommended to go to the emergency department for further evaluation.  She reported weakness and headache but denied chest pain shortness of breath.  She reported intermittent lower leg edema as well.  In the emergency department blood pressure was 147/68-1 08/82 with a pulse of 54 bpm, respiratory rate of 18.  Labs showed hemoglobin of 10.1, sodium 136, potassium 4.1, normal kidney function back in 1.7.  EKG revealed sinus bradycardia 53 beats per minutes, IVCD, LVH, prescribed block.  CT of the head was nonacute.  Metoprolol had to be discontinued due to symptomatic bradycardia.  She was maintained on telemetry and  considered stable for discharge on 06/15/2023.  She returns to clinic today accompanied by two family members. She has complaints of pain to her bilateral knees. She denies any chest pain, shortness of breath, palpitations, lightheadedness/dizziness, or worsening peripheral edema.  During recent hospitalization she had postural dizziness with presyncope. Since stopping her metoprolol during her hospital stay she has not had any recurrent symptoms. She has bilateral knee pain today.  Was sent home with home health to continue with physical therapy which is now stopped after 3 weeks.  Encouraged him to follow-up with her PCP and PCP can order extended PT as needed.  ROS: 10 point review of systems has been completed and considered negative with exception of hospitalist in the HPI  Studies Reviewed: Marland Kitchen   EKG Interpretation Date/Time:  Wednesday June 28 2023 11:12:52 EDT Ventricular Rate:  75 PR Interval:  176 QRS Duration:  118 QT Interval:  394 QTC Calculation: 439 R Axis:   -15  Text Interpretation: Sinus rhythm with occasional Premature ventricular complexes Left ventricular hypertrophy with QRS widening and repolarization abnormality When compared with ECG of 12-Jun-2023 17:41, PREVIOUS ECG IS PRESENT Confirmed by Charlsie Quest ([id number]) on 06/28/2023 11:19:27 AM    TTE 06/13/23 1. Left ventricular ejection fraction, by estimation, is 55 to 60%. The  left ventricle has normal function. The left ventricle has no regional  wall motion abnormalities. There is mild left ventricular hypertrophy.  Left ventricular diastolic parameters  are indeterminate.   2. Right ventricular  systolic function is normal. The right ventricular  size is normal. Tricuspid regurgitation signal is inadequate for assessing  PA pressure.   3. Left atrial size was mildly dilated.   4. The mitral valve is normal in structure. Mild mitral valve  regurgitation. No evidence of mitral stenosis.   5. The aortic valve is  normal in structure. Aortic valve regurgitation is  mild. Aortic valve sclerosis/calcification is present, without any  evidence of aortic stenosis.   TTE 06/09/22 1. Left ventricular ejection fraction, by estimation, is 55 to 60%. The  left ventricle has normal function. The left ventricle has no regional  wall motion abnormalities. Left ventricular diastolic parameters are  consistent with Grade I diastolic  dysfunction (impaired relaxation).   2. Right ventricular systolic function is normal. The right ventricular  size is normal. There is mildly elevated pulmonary artery systolic  pressure. The estimated right ventricular systolic pressure is 39.8 mmHg.   3. Left atrial size was mildly dilated.   4. The mitral valve is normal in structure. Mild mitral valve  regurgitation. No evidence of mitral stenosis.   5. Tricuspid valve regurgitation is moderate.   6. The aortic valve is normal in structure. Aortic valve regurgitation is  mild. Aortic valve sclerosis is present, with no evidence of aortic valve  stenosis.   7. The inferior vena cava is normal in size with greater than 50%  respiratory variability, suggesting right atrial pressure of 3 mmHg.   Risk Assessment/Calculations:             Physical Exam:   VS:  BP 133/73 (BP Location: Left Arm, Patient Position: Sitting, Cuff Size: Normal)   Pulse 75   Ht 5\' 3"  (1.6 m)   Wt 163 lb (73.9 kg)   SpO2 98%   BMI 28.87 kg/m    Wt Readings from Last 3 Encounters:  06/28/23 163 lb (73.9 kg)  06/15/23 173 lb 4.5 oz (78.6 kg)  05/19/23 165 lb (74.8 kg)    GEN: Well nourished, well developed in no acute distress NECK: No JVD; No carotid bruits CARDIAC: RRR, II/VI systolic murmur, without rubs or gallops RESPIRATORY:  Clear to auscultation without rales, wheezing or rhonchi  ABDOMEN: Soft, non-tender, non-distended EXTREMITIES: Trace pretibial edema; No deformity   ASSESSMENT AND PLAN: .   Hypertension his blood pressure today  143/75 recheck of 133/73 blood pressure has been well-controlled.  She is continued on Norvasc 10 mg daily and irbesartan 150 mg daily.  Prior metoprolol to tartrate was discontinued during recent hospitalization.  Continue to avoid AV nodal blocking agents.  Encouraged to continue to monitor blood pressures 1 to 2 hours post medications at home.  Valvular heart disease with no symptoms of failure today.  Recent echocardiogram completed 06/13/2023 revealed LVEF 55 to 60%, no regional wall motion abnormalities, mild LVH, and mild mitral valve regurgitation and aortic regurgitation.  History of symptomatic bradycardia with recent hospitalization.  Beta-blocker therapy discontinued and symptoms have resolved.  Heart rate today on EKG was 75 I have with occasional PVCs, LVH.  She is not on any other positional dizziness and lightheadedness since being off of beta-blocker therapy.  If continued recurrence of bradycardia would recommend follow-up with EP.  PVCs, PSVT, and NSVT EKG today only revealing occasional PVC.  Further workup is deferred at this time we will continue to monitor with surveillance EKGs.       Dispo: Patient to return to clinic to see MD/APP in 3 months or sooner  if needed to reevaluate symptoms.  Signed,  , NP

## 2023-06-28 NOTE — Patient Instructions (Signed)
Medication Instructions:  Your physician recommends that you continue on your current medications as directed. Please refer to the Current Medication list given to you today.  *If you need a refill on your cardiac medications before your next appointment, please call your pharmacy*   Lab Work: none If you have labs (blood work) drawn today and your tests are completely normal, you will receive your results only by: MyChart Message (if you have MyChart) OR A paper copy in the mail If you have any lab test that is abnormal or we need to change your treatment, we will call you to review the results.   Testing/Procedures: none   Follow-Up: At [facility], you and your health needs are our priority.  As part of our continuing mission to provide you with exceptional heart care, we have created designated Provider Care Teams.  These Care Teams include your primary Cardiologist (physician) and Advanced Practice Providers (APPs -  Physician Assistants and Nurse Practitioners) who all work together to provide you with the care you need, when you need it.  We recommend signing up for the patient portal called "MyChart".  Sign up information is provided on this After Visit Summary.  MyChart is used to connect with patients for Virtual Visits (Telemedicine).  Patients are able to view lab/test results, encounter notes, upcoming appointments, etc.  Non-urgent messages can be sent to your provider as well.   To learn more about what you can do with MyChart, go to [email or web address].    Your next appointment:   3 month(s)  Provider:   You may see Yvonne Kendall, MD or one of the following Advanced Practice Providers on your designated Care Team:   Charlsie Quest, NP

## 2023-07-06 ENCOUNTER — Telehealth: Payer: Self-pay

## 2023-07-06 ENCOUNTER — Telehealth: Payer: Self-pay | Admitting: Family Medicine

## 2023-07-06 DIAGNOSIS — I872 Venous insufficiency (chronic) (peripheral): Secondary | ICD-10-CM | POA: Diagnosis not present

## 2023-07-06 DIAGNOSIS — I350 Nonrheumatic aortic (valve) stenosis: Secondary | ICD-10-CM | POA: Diagnosis not present

## 2023-07-06 DIAGNOSIS — L89322 Pressure ulcer of left buttock, stage 2: Secondary | ICD-10-CM | POA: Diagnosis not present

## 2023-07-06 DIAGNOSIS — G894 Chronic pain syndrome: Secondary | ICD-10-CM | POA: Diagnosis not present

## 2023-07-06 DIAGNOSIS — I119 Hypertensive heart disease without heart failure: Secondary | ICD-10-CM | POA: Diagnosis not present

## 2023-07-06 DIAGNOSIS — E119 Type 2 diabetes mellitus without complications: Secondary | ICD-10-CM | POA: Diagnosis not present

## 2023-07-06 NOTE — Telephone Encounter (Signed)
Judeth Cornfield RN, calling from [facility] to request orders for skilled nursing needing a reassessment 1 W 2. CB- [phone] Verbal ok on VM

## 2023-07-06 NOTE — Telephone Encounter (Signed)
Returned call to [place] at [facility]- left message to proceed for another [phone]- call back

## 2023-07-10 ENCOUNTER — Telehealth: Payer: Self-pay | Admitting: Family Medicine

## 2023-07-10 NOTE — Telephone Encounter (Signed)
Home Health Verbal Orders - Caller/Agency: Liji from [facility]  Callback Number: [phone] Requesting OT/PT/Skilled Nursing/Social Work/Speech Therapy: PT Frequency: 1w2  Stated calling to extend PT. Pt will be recertified at that time. Mentioned office should be receiving a nursing order if it hasn't already to extend nursing pt has pressure sores on the bottom.  Please advise.

## 2023-07-11 ENCOUNTER — Telehealth: Payer: Self-pay

## 2023-07-11 DIAGNOSIS — G894 Chronic pain syndrome: Secondary | ICD-10-CM | POA: Diagnosis not present

## 2023-07-11 DIAGNOSIS — E119 Type 2 diabetes mellitus without complications: Secondary | ICD-10-CM | POA: Diagnosis not present

## 2023-07-11 DIAGNOSIS — L89322 Pressure ulcer of left buttock, stage 2: Secondary | ICD-10-CM | POA: Diagnosis not present

## 2023-07-11 DIAGNOSIS — I350 Nonrheumatic aortic (valve) stenosis: Secondary | ICD-10-CM | POA: Diagnosis not present

## 2023-07-11 DIAGNOSIS — I119 Hypertensive heart disease without heart failure: Secondary | ICD-10-CM | POA: Diagnosis not present

## 2023-07-11 DIAGNOSIS — I872 Venous insufficiency (chronic) (peripheral): Secondary | ICD-10-CM | POA: Diagnosis not present

## 2023-07-11 NOTE — Telephone Encounter (Signed)
Returned call to Liji/ [facility] to extend PT on pt- call back is [phone]

## 2023-07-12 DIAGNOSIS — E119 Type 2 diabetes mellitus without complications: Secondary | ICD-10-CM | POA: Diagnosis not present

## 2023-07-12 DIAGNOSIS — I350 Nonrheumatic aortic (valve) stenosis: Secondary | ICD-10-CM | POA: Diagnosis not present

## 2023-07-12 DIAGNOSIS — L89322 Pressure ulcer of left buttock, stage 2: Secondary | ICD-10-CM | POA: Diagnosis not present

## 2023-07-12 DIAGNOSIS — I872 Venous insufficiency (chronic) (peripheral): Secondary | ICD-10-CM | POA: Diagnosis not present

## 2023-07-12 DIAGNOSIS — I119 Hypertensive heart disease without heart failure: Secondary | ICD-10-CM | POA: Diagnosis not present

## 2023-07-12 DIAGNOSIS — G894 Chronic pain syndrome: Secondary | ICD-10-CM | POA: Diagnosis not present

## 2023-07-19 DIAGNOSIS — G894 Chronic pain syndrome: Secondary | ICD-10-CM | POA: Diagnosis not present

## 2023-07-19 DIAGNOSIS — I119 Hypertensive heart disease without heart failure: Secondary | ICD-10-CM | POA: Diagnosis not present

## 2023-07-19 DIAGNOSIS — I872 Venous insufficiency (chronic) (peripheral): Secondary | ICD-10-CM | POA: Diagnosis not present

## 2023-07-19 DIAGNOSIS — I350 Nonrheumatic aortic (valve) stenosis: Secondary | ICD-10-CM | POA: Diagnosis not present

## 2023-07-19 DIAGNOSIS — L89322 Pressure ulcer of left buttock, stage 2: Secondary | ICD-10-CM | POA: Diagnosis not present

## 2023-07-19 DIAGNOSIS — E119 Type 2 diabetes mellitus without complications: Secondary | ICD-10-CM | POA: Diagnosis not present

## 2023-07-21 ENCOUNTER — Telehealth: Payer: Self-pay

## 2023-07-21 ENCOUNTER — Telehealth: Payer: Self-pay | Admitting: Family Medicine

## 2023-07-21 NOTE — Telephone Encounter (Signed)
Returned call to Liji at [place] on pt- gave the okay to proceed with PT. [phone]

## 2023-07-21 NOTE — Telephone Encounter (Signed)
Home Health Verbal Orders - Caller/Agency: Liji from [facility]  Callback Number:  [phone] Requesting PT Frequency:   1x1 2x3 1x3/ gait balance and strenthening

## 2023-07-26 ENCOUNTER — Inpatient Hospital Stay (HOSPITAL_COMMUNITY)
Admission: EM | Admit: 2023-07-26 | Discharge: 2023-08-01 | DRG: 533 | Disposition: A | Discharge status: Skilled Nursing Facility | Payer: Medicare Other | Attending: Internal Medicine | Admitting: Internal Medicine

## 2023-07-26 ENCOUNTER — Encounter (HOSPITAL_COMMUNITY): Payer: Self-pay

## 2023-07-26 ENCOUNTER — Emergency Department (HOSPITAL_COMMUNITY): Payer: Medicare Other

## 2023-07-26 ENCOUNTER — Other Ambulatory Visit: Payer: Self-pay

## 2023-07-26 DIAGNOSIS — D62 Acute posthemorrhagic anemia: Secondary | ICD-10-CM | POA: Diagnosis not present

## 2023-07-26 DIAGNOSIS — Z79899 Other long term (current) drug therapy: Secondary | ICD-10-CM

## 2023-07-26 DIAGNOSIS — Z7984 Long term (current) use of oral hypoglycemic drugs: Secondary | ICD-10-CM

## 2023-07-26 DIAGNOSIS — L89312 Pressure ulcer of right buttock, stage 2: Secondary | ICD-10-CM | POA: Diagnosis present

## 2023-07-26 DIAGNOSIS — S72352A Displaced comminuted fracture of shaft of left femur, initial encounter for closed fracture: Secondary | ICD-10-CM | POA: Diagnosis not present

## 2023-07-26 DIAGNOSIS — M979XXA Periprosthetic fracture around unspecified internal prosthetic joint, initial encounter: Secondary | ICD-10-CM | POA: Diagnosis not present

## 2023-07-26 DIAGNOSIS — H409 Unspecified glaucoma: Secondary | ICD-10-CM | POA: Diagnosis not present

## 2023-07-26 DIAGNOSIS — Z743 Need for continuous supervision: Secondary | ICD-10-CM | POA: Diagnosis not present

## 2023-07-26 DIAGNOSIS — S72491A Other fracture of lower end of right femur, initial encounter for closed fracture: Secondary | ICD-10-CM | POA: Diagnosis not present

## 2023-07-26 DIAGNOSIS — W1830XA Fall on same level, unspecified, initial encounter: Secondary | ICD-10-CM | POA: Diagnosis present

## 2023-07-26 DIAGNOSIS — S72331A Displaced oblique fracture of shaft of right femur, initial encounter for closed fracture: Secondary | ICD-10-CM | POA: Diagnosis not present

## 2023-07-26 DIAGNOSIS — S72356A Nondisplaced comminuted fracture of shaft of unspecified femur, initial encounter for closed fracture: Secondary | ICD-10-CM | POA: Diagnosis not present

## 2023-07-26 DIAGNOSIS — S72302A Unspecified fracture of shaft of left femur, initial encounter for closed fracture: Secondary | ICD-10-CM | POA: Diagnosis not present

## 2023-07-26 DIAGNOSIS — S72402A Unspecified fracture of lower end of left femur, initial encounter for closed fracture: Secondary | ICD-10-CM | POA: Diagnosis not present

## 2023-07-26 DIAGNOSIS — D509 Iron deficiency anemia, unspecified: Secondary | ICD-10-CM | POA: Diagnosis not present

## 2023-07-26 DIAGNOSIS — S72332A Displaced oblique fracture of shaft of left femur, initial encounter for closed fracture: Secondary | ICD-10-CM | POA: Diagnosis not present

## 2023-07-26 DIAGNOSIS — Z888 Allergy status to other drugs, medicaments and biological substances status: Secondary | ICD-10-CM

## 2023-07-26 DIAGNOSIS — Z9842 Cataract extraction status, left eye: Secondary | ICD-10-CM | POA: Diagnosis not present

## 2023-07-26 DIAGNOSIS — I10 Essential (primary) hypertension: Secondary | ICD-10-CM | POA: Diagnosis not present

## 2023-07-26 DIAGNOSIS — E538 Deficiency of other specified B group vitamins: Secondary | ICD-10-CM | POA: Diagnosis present

## 2023-07-26 DIAGNOSIS — S0990XA Unspecified injury of head, initial encounter: Secondary | ICD-10-CM | POA: Diagnosis not present

## 2023-07-26 DIAGNOSIS — M25512 Pain in left shoulder: Secondary | ICD-10-CM | POA: Diagnosis not present

## 2023-07-26 DIAGNOSIS — Z981 Arthrodesis status: Secondary | ICD-10-CM | POA: Diagnosis not present

## 2023-07-26 DIAGNOSIS — S7292XA Unspecified fracture of left femur, initial encounter for closed fracture: Secondary | ICD-10-CM | POA: Diagnosis not present

## 2023-07-26 DIAGNOSIS — K219 Gastro-esophageal reflux disease without esophagitis: Secondary | ICD-10-CM | POA: Diagnosis not present

## 2023-07-26 DIAGNOSIS — S72492A Other fracture of lower end of left femur, initial encounter for closed fracture: Secondary | ICD-10-CM | POA: Diagnosis not present

## 2023-07-26 DIAGNOSIS — M19012 Primary osteoarthritis, left shoulder: Secondary | ICD-10-CM | POA: Diagnosis present

## 2023-07-26 DIAGNOSIS — Z886 Allergy status to analgesic agent status: Secondary | ICD-10-CM

## 2023-07-26 DIAGNOSIS — E119 Type 2 diabetes mellitus without complications: Secondary | ICD-10-CM | POA: Diagnosis not present

## 2023-07-26 DIAGNOSIS — I499 Cardiac arrhythmia, unspecified: Secondary | ICD-10-CM | POA: Diagnosis not present

## 2023-07-26 DIAGNOSIS — S7290XA Unspecified fracture of unspecified femur, initial encounter for closed fracture: Secondary | ICD-10-CM | POA: Diagnosis present

## 2023-07-26 DIAGNOSIS — S72401A Unspecified fracture of lower end of right femur, initial encounter for closed fracture: Principal | ICD-10-CM

## 2023-07-26 DIAGNOSIS — R278 Other lack of coordination: Secondary | ICD-10-CM | POA: Diagnosis not present

## 2023-07-26 DIAGNOSIS — W19XXXA Unspecified fall, initial encounter: Secondary | ICD-10-CM | POA: Diagnosis not present

## 2023-07-26 DIAGNOSIS — S72301A Unspecified fracture of shaft of right femur, initial encounter for closed fracture: Secondary | ICD-10-CM | POA: Diagnosis not present

## 2023-07-26 DIAGNOSIS — M9701XA Periprosthetic fracture around internal prosthetic right hip joint, initial encounter: Secondary | ICD-10-CM | POA: Diagnosis not present

## 2023-07-26 DIAGNOSIS — R6889 Other general symptoms and signs: Secondary | ICD-10-CM | POA: Diagnosis not present

## 2023-07-26 DIAGNOSIS — Z833 Family history of diabetes mellitus: Secondary | ICD-10-CM | POA: Diagnosis not present

## 2023-07-26 DIAGNOSIS — I700 Atherosclerosis of aorta: Secondary | ICD-10-CM | POA: Diagnosis not present

## 2023-07-26 DIAGNOSIS — Z96653 Presence of artificial knee joint, bilateral: Secondary | ICD-10-CM | POA: Diagnosis present

## 2023-07-26 DIAGNOSIS — Z01818 Encounter for other preprocedural examination: Secondary | ICD-10-CM | POA: Diagnosis not present

## 2023-07-26 DIAGNOSIS — N390 Urinary tract infection, site not specified: Secondary | ICD-10-CM | POA: Diagnosis not present

## 2023-07-26 DIAGNOSIS — M160 Bilateral primary osteoarthritis of hip: Secondary | ICD-10-CM | POA: Diagnosis not present

## 2023-07-26 DIAGNOSIS — M9712XD Periprosthetic fracture around internal prosthetic left knee joint, subsequent encounter: Secondary | ICD-10-CM | POA: Diagnosis not present

## 2023-07-26 DIAGNOSIS — M6259 Muscle wasting and atrophy, not elsewhere classified, multiple sites: Secondary | ICD-10-CM | POA: Diagnosis not present

## 2023-07-26 DIAGNOSIS — S199XXA Unspecified injury of neck, initial encounter: Secondary | ICD-10-CM | POA: Diagnosis not present

## 2023-07-26 DIAGNOSIS — Z9841 Cataract extraction status, right eye: Secondary | ICD-10-CM

## 2023-07-26 DIAGNOSIS — Z043 Encounter for examination and observation following other accident: Secondary | ICD-10-CM | POA: Diagnosis not present

## 2023-07-26 DIAGNOSIS — L89322 Pressure ulcer of left buttock, stage 2: Secondary | ICD-10-CM | POA: Diagnosis not present

## 2023-07-26 DIAGNOSIS — R001 Bradycardia, unspecified: Secondary | ICD-10-CM | POA: Diagnosis not present

## 2023-07-26 DIAGNOSIS — S72351A Displaced comminuted fracture of shaft of right femur, initial encounter for closed fracture: Secondary | ICD-10-CM | POA: Diagnosis not present

## 2023-07-26 DIAGNOSIS — I447 Left bundle-branch block, unspecified: Secondary | ICD-10-CM | POA: Diagnosis not present

## 2023-07-26 DIAGNOSIS — M6281 Muscle weakness (generalized): Secondary | ICD-10-CM | POA: Diagnosis not present

## 2023-07-26 DIAGNOSIS — S7291XA Unspecified fracture of right femur, initial encounter for closed fracture: Secondary | ICD-10-CM | POA: Diagnosis not present

## 2023-07-26 DIAGNOSIS — M9702XA Periprosthetic fracture around internal prosthetic left hip joint, initial encounter: Secondary | ICD-10-CM | POA: Diagnosis not present

## 2023-07-26 LAB — CBC WITH DIFFERENTIAL/PLATELET
Abs Immature Granulocytes: 0.04 10*3/uL (ref 0.00–0.07)
Basophils Absolute: 0 10*3/uL (ref 0.0–0.1)
Basophils Relative: 0 %
Eosinophils Absolute: 0 10*3/uL (ref 0.0–0.5)
Eosinophils Relative: 0 %
HCT: 29.3 % — ABNORMAL LOW (ref 36.0–46.0)
Hemoglobin: 9.3 g/dL — ABNORMAL LOW (ref 12.0–15.0)
Immature Granulocytes: 0 %
Lymphocytes Relative: 13 %
Lymphs Abs: 1.3 10*3/uL (ref 0.7–4.0)
MCH: 26.6 pg (ref 26.0–34.0)
MCHC: 31.7 g/dL (ref 30.0–36.0)
MCV: 83.7 fL (ref 80.0–100.0)
Monocytes Absolute: 0.6 10*3/uL (ref 0.1–1.0)
Monocytes Relative: 6 %
Neutro Abs: 7.8 10*3/uL — ABNORMAL HIGH (ref 1.7–7.7)
Neutrophils Relative %: 81 %
Platelets: 310 10*3/uL (ref 150–400)
RBC: 3.5 MIL/uL — ABNORMAL LOW (ref 3.87–5.11)
RDW: 15.9 % — ABNORMAL HIGH (ref 11.5–15.5)
WBC: 9.8 10*3/uL (ref 4.0–10.5)
nRBC: 0 % (ref 0.0–0.2)

## 2023-07-26 LAB — COMPREHENSIVE METABOLIC PANEL
ALT: 13 U/L (ref 0–44)
AST: 15 U/L (ref 15–41)
Albumin: 3.2 g/dL — ABNORMAL LOW (ref 3.5–5.0)
Alkaline Phosphatase: 73 U/L (ref 38–126)
Anion gap: 13 (ref 5–15)
BUN: 20 mg/dL (ref 8–23)
CO2: 18 mmol/L — ABNORMAL LOW (ref 22–32)
Calcium: 8.9 mg/dL (ref 8.9–10.3)
Chloride: 104 mmol/L (ref 98–111)
Creatinine, Ser: 0.67 mg/dL (ref 0.44–1.00)
GFR, Estimated: >60 mL/min (ref >60)
Glucose, Bld: 160 mg/dL — ABNORMAL HIGH (ref 70–99)
Potassium: 4.1 mmol/L (ref 3.5–5.1)
Sodium: 135 mmol/L (ref 135–145)
Total Bilirubin: 0.6 mg/dL (ref 0.3–1.2)
Total Protein: 7.3 g/dL (ref 6.5–8.1)

## 2023-07-26 LAB — TYPE AND SCREEN
ABO/RH(D): O POS
Antibody Screen: NEGATIVE

## 2023-07-26 LAB — PROTIME-INR
INR: 1.1 (ref 0.8–1.2)
Prothrombin Time: 14.5 s (ref 11.4–15.2)

## 2023-07-26 MED ORDER — ONDANSETRON HCL 4 MG PO TABS: 4.0000 mg | ORAL_TABLET | Freq: Four times a day (QID) | ORAL | Status: DC | PRN

## 2023-07-26 MED ORDER — LATANOPROST 0.005 % OP SOLN
1.0000 [drp] | Freq: Every day | OPHTHALMIC | Status: DC
  Administered 2023-07-28 – 2023-07-31 (×5): 1 [drp] via OPHTHALMIC
  Filled 2023-07-26 (×2): qty 2.5

## 2023-07-26 MED ORDER — FENTANYL CITRATE PF 50 MCG/ML IJ SOSY
12.5000 ug | PREFILLED_SYRINGE | INTRAMUSCULAR | Status: DC | PRN
  Administered 2023-07-27 – 2023-07-31 (×4): 50 ug via INTRAVENOUS
  Filled 2023-07-26 (×5): qty 1

## 2023-07-26 MED ORDER — METHOCARBAMOL 1000 MG/10ML IJ SOLN: 500.0000 mg | Freq: Four times a day (QID) | INTRAVENOUS | Status: DC | PRN

## 2023-07-26 MED ORDER — ONDANSETRON HCL 4 MG/2ML IJ SOLN
4.0000 mg | Freq: Once | INTRAMUSCULAR | Status: AC
  Administered 2023-07-26: 4 mg via INTRAVENOUS
  Filled 2023-07-26: qty 2

## 2023-07-26 MED ORDER — PANTOPRAZOLE SODIUM 40 MG PO TBEC
40.0000 mg | DELAYED_RELEASE_TABLET | Freq: Every day | ORAL | Status: DC
  Administered 2023-07-28 – 2023-08-01 (×5): 40 mg via ORAL
  Filled 2023-07-26 (×5): qty 1

## 2023-07-26 MED ORDER — MORPHINE SULFATE (PF) 4 MG/ML IV SOLN
4.0000 mg | Freq: Once | INTRAVENOUS | Status: DC
  Filled 2023-07-26: qty 1

## 2023-07-26 MED ORDER — MORPHINE SULFATE (PF) 4 MG/ML IV SOLN
4.0000 mg | INTRAVENOUS | Status: AC | PRN
  Administered 2023-07-26 (×2): 4 mg via INTRAVENOUS
  Filled 2023-07-26: qty 1

## 2023-07-26 MED ORDER — MORPHINE SULFATE (PF) 4 MG/ML IV SOLN
4.0000 mg | Freq: Once | INTRAVENOUS | Status: AC
  Administered 2023-07-26: 4 mg via INTRAVENOUS
  Filled 2023-07-26: qty 1

## 2023-07-26 MED ORDER — SODIUM CHLORIDE 0.9 % IV SOLN: INTRAVENOUS | Status: DC

## 2023-07-26 MED ORDER — AMLODIPINE BESYLATE 10 MG PO TABS
10.0000 mg | ORAL_TABLET | Freq: Every day | ORAL | Status: DC
  Administered 2023-07-28 – 2023-07-30 (×3): 10 mg via ORAL
  Filled 2023-07-26 (×3): qty 1

## 2023-07-26 MED ORDER — ACETAMINOPHEN 325 MG PO TABS
650.0000 mg | ORAL_TABLET | Freq: Four times a day (QID) | ORAL | Status: DC | PRN
  Administered 2023-07-29 – 2023-08-01 (×3): 650 mg via ORAL
  Filled 2023-07-26 (×3): qty 2

## 2023-07-26 MED ORDER — OXYCODONE HCL 5 MG PO TABS
5.0000 mg | ORAL_TABLET | ORAL | Status: DC | PRN
  Administered 2023-07-27 – 2023-08-01 (×14): 5 mg via ORAL
  Filled 2023-07-26 (×14): qty 1

## 2023-07-26 MED ORDER — ACETAMINOPHEN 650 MG RE SUPP: 650.0000 mg | Freq: Four times a day (QID) | RECTAL | Status: DC | PRN

## 2023-07-26 MED ORDER — ONDANSETRON HCL 4 MG/2ML IJ SOLN
4.0000 mg | Freq: Four times a day (QID) | INTRAMUSCULAR | Status: DC | PRN
  Administered 2023-07-27: 4 mg via INTRAVENOUS

## 2023-07-26 NOTE — H&P (Signed)
History and Physical    [name] ENI:[id number] DOB: 04-09-1932 DOA: 07/26/2023  PCP: Duanne Limerick, MD   Chief Complaint: Fall  HPI: [name] is a 89 y.o. female with medical history significant of GERD, hypertension, type 2 diabetes who presented to the emergency department after mechanical fall.  Patient typically uses a lift chair to stand up and move to the commode in order to go the bathroom.  This time one of her knees gave out and she landed on bilateral knees.  She endorses severe pain in both legs and was unable to get up.  She was brought to the ER for further assessment.  On arrival to the emergency department she was afebrile dynamically well.  Labs were obtained on presentation which revealed WBC 9.8, hemoglobin 9.3, platelets 310, INR 1.1, sodium 135, glucose 160.  Patient underwent chest x-ray which showed no acute cardiopulmonary disease.  X-rays of left and right femur demonstrated bilateral femur fractures.  Orthopedics was consulted and patient was transferred to [facility] for plans of surgery in the morning.  CT of spine showed no fracture.  CT head showed no acute intracranial abnormality.  On evaluation patient was endorsing persistent pain.  Found with bedside and all questions were answered.  She denies any infectious complaints or other symptoms.   Review of Systems: Review of Systems  Constitutional: Negative.  Negative for chills and fever.  HENT: Negative.    Respiratory: Negative.  Negative for cough and shortness of breath.   Cardiovascular:  Negative for chest pain.  Gastrointestinal: Negative.   Genitourinary: Negative.   Musculoskeletal: Negative.   Neurological: Negative.   Endo/Heme/Allergies: Negative.   Psychiatric/Behavioral: Negative.    All other systems reviewed and are negative.    As per HPI otherwise 10 point review of systems negative.   Allergies  Allergen Reactions   Metoprolol Other (See Comments)    "Metoprolol had to be  discontinued due to symptomatic bradycardia," per notes on 06/28/2023   Nsaids Other (See Comments)    Uncertain if this truly is an issue   Lisinopril Cough    Past Medical History:  Diagnosis Date   GERD (gastroesophageal reflux disease)    Hypertension    Right knee DJD    Type II diabetes mellitus (HCC)     Past Surgical History:  Procedure Laterality Date   BACK SURGERY     CATARACT EXTRACTION Bilateral 03/2016   CERVICAL DISC SURGERY  12/28/2010   "bulging disc; went in on the left side of my neck" (08/19/2013)   JOINT REPLACEMENT     TOTAL KNEE ARTHROPLASTY Left 03/28/2011   Dr Thurston Hole   TOTAL KNEE ARTHROPLASTY Right 08/19/2013   TOTAL KNEE ARTHROPLASTY Right 08/19/2013   Procedure: TOTAL KNEE ARTHROPLASTY;  Surgeon: Nilda Simmer, MD;  Location: [facility] OR;  Service: Orthopedics;  Laterality: Right;     reports that she has never smoked. She has never used smokeless tobacco. She reports that she does not currently use alcohol. She reports that she does not use drugs.  Family History  Problem Relation Age of Onset   Diabetes Mother    Cerebral aneurysm Father     Prior to Admission medications   Medication Sig Start Date End Date Taking? Authorizing Provider  amLODipine (NORVASC) 10 MG tablet Take 1 tablet (10 mg total) by mouth daily. 06/28/23 07/28/23 Yes Hammock, Lavonna Rua, NP  cyanocobalamin 100 MCG tablet Take 100 mcg by mouth daily.   Yes [provider]  fluocinonide (LIDEX) 0.05 % external solution Apply 1 Application topically See admin instructions. Apply to bilateral legs once a day   Yes [provider]  furosemide (LASIX) 20 MG tablet Take 1 tablet (20 mg total) by mouth daily. Decrease dosing to three times a week Patient taking differently: Take 20 mg by mouth See admin instructions. Take 20 mg by mouth in the morning on Mon/Wed/Fri 09/16/22  Yes Elizabeth Sauer C, MD  glipiZIDE (GLUCOTROL XL) 2.5 MG 24 hr tablet Take 1 tablet (2.5 mg total) by mouth  daily with breakfast. 05/09/23  Yes Duanne Limerick, MD  irbesartan (AVAPRO) 150 MG tablet Take 1 tablet (150 mg total) by mouth daily. 06/28/23  Yes Hammock, Sheri, NP  latanoprost (XALATAN) 0.005 % ophthalmic solution Place 1 drop into both eyes at bedtime.   Yes [provider]  metFORMIN (GLUCOPHAGE) 500 MG tablet Take 1 tablet (500 mg total) by mouth 2 (two) times daily with a meal. 05/09/23  Yes Duanne Limerick, MD  mometasone (NASONEX) 50 MCG/ACT nasal spray Use 2 spray(s) in each nostril once daily Patient taking differently: Place 2 sprays into the nose daily as needed. 01/06/23  Yes Duanne Limerick, MD  montelukast (SINGULAIR) 10 MG tablet Take 1 tablet (10 mg total) by mouth at bedtime. 01/06/23  Yes Duanne Limerick, MD  omeprazole (PRILOSEC) 20 MG capsule Take 1 capsule (20 mg total) by mouth daily. Patient taking differently: Take 20 mg by mouth daily before breakfast. 09/16/22  Yes Duanne Limerick, MD  TYLENOL 500 MG tablet Take 500-1,000 mg by mouth every 6 (six) hours as needed (for pain).   Yes [provider]  docusate sodium (COLACE) 50 MG capsule Take 1 capsule (50 mg total) by mouth 2 (two) times daily. Patient not taking: Reported on 07/26/2023 01/17/19   Duanne Limerick, MD  glucose blood (ONE TOUCH ULTRA TEST) test strip USE 1 STRIP TO CHECK GLUCOSE ONCE DAILY 05/17/19   Duanne Limerick, MD  loratadine (CLARITIN) 10 MG tablet Take 1 tablet (10 mg total) by mouth daily. Patient not taking: Reported on 07/26/2023 01/06/23   Duanne Limerick, MD    Physical Exam: Vitals:   07/26/23 1912 07/26/23 2115 07/26/23 2130  BP: (!) 164/76  (!) 155/76  Pulse: 89 (!) 101 74  Resp: (!) 28 19 19   Temp: 98.3 F (36.8 C)    TempSrc: Oral    SpO2: 100% 99% 98%   Physical Exam Constitutional:      Appearance: She is normal weight.  HENT:     Head: Normocephalic.     Nose: Nose normal.     Mouth/Throat:     Mouth: Mucous membranes are moist.     Pharynx: Oropharynx is  clear.  Eyes:     Conjunctiva/sclera: Conjunctivae normal.     Pupils: Pupils are equal, round, and reactive to light.  Cardiovascular:     Rate and Rhythm: Normal rate.     Pulses: Normal pulses.  Pulmonary:     Effort: Pulmonary effort is normal.     Breath sounds: Normal breath sounds.  Abdominal:     General: Abdomen is flat. Bowel sounds are normal.  Musculoskeletal:        General: Normal range of motion.     Cervical back: Normal range of motion.  Skin:    General: Skin is warm.     Capillary Refill: Capillary refill takes less than 2 seconds.  Neurological:     General: No focal deficit present.     Mental Status: She is alert. Mental status is at baseline.  Psychiatric:        Mood and Affect: Mood normal.       Labs on Admission: I have personally reviewed the patients's labs and imaging studies.  Assessment/Plan Principal Problem:   Femur fracture (HCC)   # Ground-level fall complicated by bilateral femur fracture - Orthopedics consulted and recommended transfer to [facility] for planned surgery in the morning - Significant pain requiring IV analgesia - Orthopedics consulted in ER Plan: N.p.o. at midnight for surgery tomorrow Remaining care per orthopedics IV analgesia, as needed Robaxin  # hypertension-continue amlodipine.  Will hold ARB given impending surgery  # GERD-continue ppi  # Glaucoma-continue eyedrops  # Type 2 diabetes-hold oral antiglycemic's  Admission status: Inpatient Med-Surg  Certification: The appropriate patient status for this patient is INPATIENT. Inpatient status is judged to be reasonable and necessary in order to provide the required intensity of service to ensure the patient's safety. The patient's presenting symptoms, physical exam findings, and initial radiographic and laboratory data in the context of their chronic comorbidities is felt to place them at high risk for further clinical deterioration. Furthermore, it is not  anticipated that the patient will be medically stable for discharge from the hospital within 2 midnights of admission.   * I certify that at the point of admission it is my clinical judgment that the patient will require inpatient hospital care spanning beyond 2 midnights from the point of admission due to high intensity of service, high risk for further deterioration and high frequency of surveillance required.Alan Mulder MD [facility] If 7PM-7AM, please contact night-coverage [email or web address]  07/26/2023, 10:20 PM

## 2023-07-26 NOTE — ED Provider Notes (Signed)
[facility]GENC[facility] DEPARTMENT AT [facility] Provider Note   CSN: [id number] Arrival date & time: 07/26/23  1903     History  Chief Complaint  Patient presents with   Hip Pain    [name] is a 89 y.o. female.  Pt is an 89 yo female with pmhx significant for htn, gerd, and dm2.  She had a mechanical fall this evening and landed on her knees. She has pain to both hips and to left knee.  She denies hitting her head.  She was unable to get up.       Home Medications Prior to Admission medications   Medication Sig Start Date End Date Taking? Authorizing Provider  amLODipine (NORVASC) 10 MG tablet Take 1 tablet (10 mg total) by mouth daily. 06/28/23 07/28/23 Yes Hammock, Lavonna Rua, NP  cyanocobalamin 100 MCG tablet Take 100 mcg by mouth daily.   Yes [provider]  furosemide (LASIX) 20 MG tablet Take 1 tablet (20 mg total) by mouth daily. Decrease dosing to three times a week Patient taking differently: Take 20 mg by mouth every Monday, Wednesday, and Friday. morning 09/16/22  Yes Duanne Limerick, MD  irbesartan (AVAPRO) 150 MG tablet Take 1 tablet (150 mg total) by mouth daily. 06/28/23  Yes Hammock, Sheri, NP  latanoprost (XALATAN) 0.005 % ophthalmic solution Place 1 drop into both eyes at bedtime.   Yes [provider]  metFORMIN (GLUCOPHAGE) 500 MG tablet Take 1 tablet (500 mg total) by mouth 2 (two) times daily with a meal. 05/09/23  Yes Duanne Limerick, MD  mometasone (NASONEX) 50 MCG/ACT nasal spray Use 2 spray(s) in each nostril once daily Patient taking differently: Place 2 sprays into the nose daily as needed. 01/06/23  Yes Duanne Limerick, MD  montelukast (SINGULAIR) 10 MG tablet Take 1 tablet (10 mg total) by mouth at bedtime. 01/06/23  Yes Duanne Limerick, MD  omeprazole (PRILOSEC) 20 MG capsule Take 1 capsule (20 mg total) by mouth daily. Patient taking differently: Take 20 mg by mouth daily before breakfast. 09/16/22  Yes Duanne Limerick, MD   TYLENOL 500 MG tablet Take 500-1,000 mg by mouth every 6 (six) hours as needed (for pain).   Yes [provider]  docusate sodium (COLACE) 50 MG capsule Take 1 capsule (50 mg total) by mouth 2 (two) times daily. Patient not taking: Reported on 07/26/2023 01/17/19   Duanne Limerick, MD  fluocinonide (LIDEX) 0.05 % external solution Apply 1 Application topically daily.    [provider]  glipiZIDE (GLUCOTROL XL) 2.5 MG 24 hr tablet Take 1 tablet (2.5 mg total) by mouth daily with breakfast. 05/09/23   Elizabeth Sauer C, MD  glucose blood (ONE TOUCH ULTRA TEST) test strip USE 1 STRIP TO CHECK GLUCOSE ONCE DAILY 05/17/19   Duanne Limerick, MD  loratadine (CLARITIN) 10 MG tablet Take 1 tablet (10 mg total) by mouth daily. Patient not taking: Reported on 07/26/2023 01/06/23   Duanne Limerick, MD      Allergies    Nsaids and Lisinopril    Review of Systems   Review of Systems  Musculoskeletal:        Bilateral hip pain, left knee pain  All other systems reviewed and are negative.   Physical Exam Updated Vital Signs BP (!) 155/76   Pulse 74   Temp 98.3 F (36.8 C) (Oral)   Resp 19   SpO2 98%  Physical Exam Vitals and nursing note  reviewed.  Constitutional:      Appearance: Normal appearance. She is obese.  HENT:     Head: Normocephalic and atraumatic.     Right Ear: External ear normal.     Left Ear: External ear normal.     Nose: Nose normal.     Mouth/Throat:     Mouth: Mucous membranes are moist.     Pharynx: Oropharynx is clear.  Eyes:     Extraocular Movements: Extraocular movements intact.     Conjunctiva/sclera: Conjunctivae normal.     Pupils: Pupils are equal, round, and reactive to light.  Cardiovascular:     Rate and Rhythm: Normal rate and regular rhythm.     Pulses: Normal pulses.     Heart sounds: Normal heart sounds.  Pulmonary:     Effort: Pulmonary effort is normal.     Breath sounds: Normal breath sounds.  Abdominal:     General: Abdomen is  flat. Bowel sounds are normal.     Palpations: Abdomen is soft.  Musculoskeletal:     Cervical back: Normal range of motion and neck supple.       Legs:  Skin:    General: Skin is warm.     Capillary Refill: Capillary refill takes less than 2 seconds.  Neurological:     General: No focal deficit present.     Mental Status: She is alert and oriented to person, place, and time.  Psychiatric:        Mood and Affect: Mood normal.        Behavior: Behavior normal.     ED Results / Procedures / Treatments   Labs (all labs ordered are listed, but only abnormal results are displayed) Labs Reviewed  CBC WITH DIFFERENTIAL/PLATELET - Abnormal; Notable for the following components:      Result Value   RBC 3.50 (*)    Hemoglobin 9.3 (*)    HCT 29.3 (*)    RDW 15.9 (*)    Neutro Abs 7.8 (*)    All other components within normal limits  COMPREHENSIVE METABOLIC PANEL - Abnormal; Notable for the following components:   CO2 18 (*)    Glucose, Bld 160 (*)    Albumin 3.2 (*)    All other components within normal limits  PROTIME-INR  URINALYSIS, ROUTINE W REFLEX MICROSCOPIC  TYPE AND SCREEN    EKG EKG Interpretation Date/Time:  Wednesday July 26 2023 19:20:40 EDT Ventricular Rate:  90 PR Interval:  198 QRS Duration:  120 QT Interval:  392 QTC Calculation: 480 R Axis:   -15  Text Interpretation: Sinus rhythm LVH with secondary repolarization abnormality Anterior infarct, old No significant change since last tracing Confirmed by Jacalyn Lefevre [id number]) on 07/26/2023 9:41:12 PM  Radiology CT HEAD WO CONTRAST  Result Date: 07/26/2023 CLINICAL DATA:  Polytrauma, blunt.  Patient fell. EXAM: CT HEAD WITHOUT CONTRAST CT CERVICAL SPINE WITHOUT CONTRAST TECHNIQUE: Multidetector CT imaging of the head and cervical spine was performed following the standard protocol without intravenous contrast. Multiplanar CT image reconstructions of the cervical spine were also generated. RADIATION DOSE  REDUCTION: This exam was performed according to the departmental dose-optimization program which includes automated exposure control, adjustment of the mA and/or kV according to patient size and/or use of iterative reconstruction technique. COMPARISON:  CT scan head from 06/12/2023 and CT scan cervical spine from 12/27/2010. FINDINGS: CT HEAD FINDINGS Brain: No evidence of acute infarction, hemorrhage, hydrocephalus, extra-axial collection or mass lesion/mass effect. Vascular: No hyperdense vessel or unexpected  calcification. Skull: Normal. Negative for fracture or focal lesion. Sinuses/Orbits: No acute finding. Other: None. CT CERVICAL SPINE FINDINGS Alignment: Normal. This examination does not assess for ligamentous injury or stability. Note is made of C3-4 ACDF. Skull base and vertebrae: No acute fracture. No primary bone lesion or focal pathologic process. Soft tissues and spinal canal: No prevertebral fluid or swelling. No visible canal hematoma. Disc levels: There are moderate multilevel degenerative changes of the cervical spine characterized by reduced intervertebral disc height, facet arthropathy and marginal osteophyte formation, most pronounced at C4-5 level. Upper chest: Negative. Other: None. IMPRESSION: 1. No acute intracranial abnormality. 2. No acute fracture or traumatic malalignment of the cervical spine. 3. Moderate multilevel degenerative changes of the cervical spine. Electronically Signed   By: Jules Schick M.D.   On: 07/26/2023 20:43   CT CERVICAL SPINE WO CONTRAST  Result Date: 07/26/2023 CLINICAL DATA:  Polytrauma, blunt.  Patient fell. EXAM: CT HEAD WITHOUT CONTRAST CT CERVICAL SPINE WITHOUT CONTRAST TECHNIQUE: Multidetector CT imaging of the head and cervical spine was performed following the standard protocol without intravenous contrast. Multiplanar CT image reconstructions of the cervical spine were also generated. RADIATION DOSE REDUCTION: This exam was performed according to the  departmental dose-optimization program which includes automated exposure control, adjustment of the mA and/or kV according to patient size and/or use of iterative reconstruction technique. COMPARISON:  CT scan head from 06/12/2023 and CT scan cervical spine from 12/27/2010. FINDINGS: CT HEAD FINDINGS Brain: No evidence of acute infarction, hemorrhage, hydrocephalus, extra-axial collection or mass lesion/mass effect. Vascular: No hyperdense vessel or unexpected calcification. Skull: Normal. Negative for fracture or focal lesion. Sinuses/Orbits: No acute finding. Other: None. CT CERVICAL SPINE FINDINGS Alignment: Normal. This examination does not assess for ligamentous injury or stability. Note is made of C3-4 ACDF. Skull base and vertebrae: No acute fracture. No primary bone lesion or focal pathologic process. Soft tissues and spinal canal: No prevertebral fluid or swelling. No visible canal hematoma. Disc levels: There are moderate multilevel degenerative changes of the cervical spine characterized by reduced intervertebral disc height, facet arthropathy and marginal osteophyte formation, most pronounced at C4-5 level. Upper chest: Negative. Other: None. IMPRESSION: 1. No acute intracranial abnormality. 2. No acute fracture or traumatic malalignment of the cervical spine. 3. Moderate multilevel degenerative changes of the cervical spine. Electronically Signed   By: Jules Schick M.D.   On: 07/26/2023 20:43   DG FEMUR MIN 2 VIEWS LEFT  Result Date: 07/26/2023 CLINICAL DATA:  Fall. EXAM: RIGHT FEMUR 2 VIEWS; LEFT FEMUR 2 VIEWS COMPARISON:  None Available. FINDINGS: Examination is limited due to patient related factors. There is comminuted fracture of the distal half of the right femur. There is approximately 1 shaft width displacement. There is also angulated and comminuted fracture of the distal half of the left femur which probably reaches up to the knee transplant femoral prosthesis. Bilateral knee arthroplasty  noted. No aggressive osseous lesion. No focal soft tissue swelling. No radiopaque foreign bodies. IMPRESSION: Bilateral femur fractures. Electronically Signed   By: Jules Schick M.D.   On: 07/26/2023 20:26   DG FEMUR, MIN 2 VIEWS RIGHT  Result Date: 07/26/2023 CLINICAL DATA:  Fall. EXAM: RIGHT FEMUR 2 VIEWS; LEFT FEMUR 2 VIEWS COMPARISON:  None Available. FINDINGS: Examination is limited due to patient related factors. There is comminuted fracture of the distal half of the right femur. There is approximately 1 shaft width displacement. There is also angulated and comminuted fracture of the distal half of the  left femur which probably reaches up to the knee transplant femoral prosthesis. Bilateral knee arthroplasty noted. No aggressive osseous lesion. No focal soft tissue swelling. No radiopaque foreign bodies. IMPRESSION: Bilateral femur fractures. Electronically Signed   By: Jules Schick M.D.   On: 07/26/2023 20:26   DG Pelvis 1-2 Views  Result Date: 07/26/2023 CLINICAL DATA:  [id number] Fall [id number] EXAM: PELVIS - 1-2 VIEW COMPARISON:  None Available. FINDINGS: Examination is markedly limited due to patient related factors. No acute displaced fracture seen. There are changes of chronic pubic symphisitis. There are moderate to severe degenerative changes of bilateral hip joints in the form of reduced joint space, subchondral sclerosis / cystic changes and osteophytosis. No radiopaque foreign bodies. IMPRESSION: *Markedly limited exam. No acute displaced fracture. If there is high clinical concern for fracture, cross-sectional imaging is recommended. Electronically Signed   By: Jules Schick M.D.   On: 07/26/2023 20:23   DG Chest 1 View  Result Date: 07/26/2023 CLINICAL DATA:  fall. EXAM: CHEST  1 VIEW COMPARISON:  11/28/2022. FINDINGS: Bilateral lung fields are clear. Bilateral lateral costophrenic angles are clear. Stable cardio-mediastinal silhouette. Aortic arch calcifications noted. No acute osseous  abnormalities. The soft tissues are within normal limits. IMPRESSION: *No acute cardiopulmonary process. Electronically Signed   By: Jules Schick M.D.   On: 07/26/2023 20:20    Procedures Procedures    Medications Ordered in ED Medications  0.9 %  sodium chloride infusion ( Intravenous New Bag/Given 07/26/23 1924)  morphine (PF) 4 MG/ML injection 4 mg (has no administration in time range)  morphine (PF) 4 MG/ML injection 4 mg (4 mg Intravenous Given 07/26/23 2113)  ondansetron (ZOFRAN) injection 4 mg (4 mg Intravenous Given 07/26/23 1925)    ED Course/ Medical Decision Making/ A&P                                 Medical Decision Making Amount and/or Complexity of Data Reviewed Labs: ordered. Radiology: ordered.  Risk Prescription drug management. Decision regarding hospitalization.   This patient presents to the ED for concern of fall, this involves an extensive number of treatment options, and is a complaint that carries with it a high risk of complications and morbidity.  The differential diagnosis includes multiple trauma   Co morbidities that complicate the patient evaluation  htn, gerd, and dm2   Additional history obtained:  Additional history obtained from epic chart review External records from outside source obtained and reviewed including EMS report   Lab Tests:  I Ordered, and personally interpreted labs.  The pertinent results include:  cbc with hgb 9.3 (stable), cmp nl other than bs elevated at 160, inr 1.1   Imaging Studies ordered:  I ordered imaging studies including ct head, ct c-spine, B femurs, hip, chest  I independently visualized and interpreted imaging which showed  CT head/C-spine: No acute intracranial abnormality.  2. No acute fracture or traumatic malalignment of the cervical  spine.  3. Moderate multilevel degenerative changes of the cervical spine.   B femurs:  There is comminuted fracture of the distal half of the right femur.   There is approximately 1 shaft width displacement.    There is also angulated and comminuted fracture of the distal half  of the left femur which probably reaches up to the knee transplant  femoral prosthesis.    Bilateral knee arthroplasty noted.    No aggressive osseous lesion.  No focal soft tissue swelling.    No radiopaque foreign bodies.  Pelvis: Markedly limited exam. No acute displaced fracture. If there is  high clinical concern for fracture, cross-sectional imaging is  recommended.  CXR: No acute cardiopulmonary process.  I agree with the radiologist interpretation   Cardiac Monitoring:  The patient was maintained on a cardiac monitor.  I personally viewed and interpreted the cardiac monitored which showed an underlying rhythm of: nsr   Medicines ordered and prescription drug management:  I ordered medication including morphine  for pain  Reevaluation of the patient after these medicines showed that the patient improved I have reviewed the patients home medicines and have made adjustments as needed   Test Considered:  ct   Critical Interventions:  Pain control   Consultations Obtained:  I requested consultation with the orthopedist (Dr. Jena Gauss),  and discussed lab and imaging findings as well as pertinent plan -he recommends admission to [facility] by the medicine service.  He also asks that we put pt in bilateral knee immobilizers.  NPO after midnight. Pt d/w Dr. Avie Arenas (hospitalists) who will admit.   Problem List / ED Course:  Bilateral distal femur fx:  Plan for ortho repair.  Medicine to admit.   Reevaluation:  After the interventions noted above, I reevaluated the patient and found that they have :improved   Social Determinants of Health:  Lives at home   Dispostion:  After consideration of the diagnostic results and the patients response to treatment, I feel that the patent would benefit from admission.          Final Clinical  Impression(s) / ED Diagnoses Final diagnoses:  Closed fracture of distal end of right femur, unspecified fracture morphology, initial encounter (HCC)  Closed fracture of distal end of left femur, unspecified fracture morphology, initial encounter [facility])    Rx / DC Orders ED Discharge Orders     None         Jacalyn Lefevre, MD 07/26/23 2141

## 2023-07-26 NOTE — ED Notes (Signed)
ED Provider at bedside. 

## 2023-07-26 NOTE — ED Notes (Signed)
Contacted [facility] to schedule transport to [facility]

## 2023-07-26 NOTE — ED Triage Notes (Signed)
Pt presents via EMS from home c/o bilateral hip pain s/p mechanical fall. Reports fell while trying to transfer.

## 2023-07-26 NOTE — ED Notes (Signed)
ED TO INPATIENT HANDOFF REPORT  ED Nurse Name and Phone #: Durene Cal RN   S Name/Age/Gender [name] 89 y.o. female Room/Bed: RESB/RESB  Code Status   Code Status: Full Code  Home/SNF/Other Home Patient oriented to: self, place, time, and situation Is this baseline? Yes   Triage Complete: Triage complete  Chief Complaint Femur fracture (HCC) [S72.90XA]  Triage Note Pt presents via EMS from home c/o bilateral hip pain s/p mechanical fall. Reports fell while trying to transfer.    Allergies Allergies  Allergen Reactions   Metoprolol Other (See Comments)    "Metoprolol had to be discontinued due to symptomatic bradycardia," per notes on 06/28/2023   Nsaids Other (See Comments)    Uncertain if this truly is an issue   Lisinopril Cough    Level of Care/Admitting Diagnosis ED Disposition     ED Disposition  Admit   Condition  --   Comment  Hospital Area: [facility] [[id number]]  Level of Care: Med-Surg [16]  May admit patient to [facility] or [facility] if equivalent level of care is available:: Yes  Covid Evaluation: Asymptomatic - no recent exposure (last 10 days) testing not required  Diagnosis: Femur fracture [facility]) [[id number]]  Admitting Physician: Alan Mulder [[id number]]  Attending Physician: Alan Mulder [[id number]]  Certification:: I certify this patient will need inpatient services for at least 2 midnights  Expected Medical Readiness: 07/28/2023          B Medical/Surgery History Past Medical History:  Diagnosis Date   GERD (gastroesophageal reflux disease)    Hypertension    Right knee DJD    Type II diabetes mellitus (HCC)    Past Surgical History:  Procedure Laterality Date   BACK SURGERY     CATARACT EXTRACTION Bilateral 03/2016   CERVICAL DISC SURGERY  12/28/2010   "bulging disc; went in on the left side of my neck" (08/19/2013)   JOINT REPLACEMENT     TOTAL KNEE ARTHROPLASTY Left 03/28/2011   Dr Thurston Hole   TOTAL KNEE  ARTHROPLASTY Right 08/19/2013   TOTAL KNEE ARTHROPLASTY Right 08/19/2013   Procedure: TOTAL KNEE ARTHROPLASTY;  Surgeon: Nilda Simmer, MD;  Location: [facility] OR;  Service: Orthopedics;  Laterality: Right;     A IV Location/Drains/Wounds Patient Lines/Drains/Airways Status     Active Line/Drains/Airways     Name Placement date Placement time Site Days   Peripheral IV 07/26/23 20 G Left;Posterior Hand 07/26/23  1911  Hand  less than 1   Pressure Injury 06/13/23 Buttocks Right;Left Stage 2 -  Partial thickness loss of dermis presenting as a shallow open injury with a red, pink wound bed without slough. healing pressure injury. no drainage noted. pt states she sees wound care regularly. 06/13/23  --  -- 43            Intake/Output Last 24 hours No intake or output data in the 24 hours ending 07/26/23 2327  Labs/Imaging Results for orders placed or performed during the hospital encounter of 07/26/23 (from the past 48 hour(s))  CBC with Differential     Status: Abnormal   Collection Time: 07/26/23  7:29 PM  Result Value Ref Range   WBC 9.8 4.0 - 10.5 K/uL   RBC 3.50 (L) 3.87 - 5.11 MIL/uL   Hemoglobin 9.3 (L) 12.0 - 15.0 g/dL   HCT 29.5 (L) 62.1 - 30.8 %   MCV 83.7 80.0 - 100.0 fL   MCH 26.6 26.0 - 34.0 pg   MCHC  31.7 30.0 - 36.0 g/dL   RDW 40.9 (H) 81.1 - 91.4 %   Platelets 310 150 - 400 K/uL   nRBC 0.0 0.0 - 0.2 %   Neutrophils Relative % 81 %   Neutro Abs 7.8 (H) 1.7 - 7.7 K/uL   Lymphocytes Relative 13 %   Lymphs Abs 1.3 0.7 - 4.0 K/uL   Monocytes Relative 6 %   Monocytes Absolute 0.6 0.1 - 1.0 K/uL   Eosinophils Relative 0 %   Eosinophils Absolute 0.0 0.0 - 0.5 K/uL   Basophils Relative 0 %   Basophils Absolute 0.0 0.0 - 0.1 K/uL   Immature Granulocytes 0 %   Abs Immature Granulocytes 0.04 0.00 - 0.07 K/uL    Comment: Performed at [facility], [address], [place], [place] [address]  Protime-INR     Status: None   Collection Time: 07/26/23  7:29  PM  Result Value Ref Range   Prothrombin Time 14.5 11.4 - 15.2 seconds   INR 1.1 0.8 - 1.2    Comment: (NOTE) INR goal varies based on device and disease states. Performed at [facility], [address] [place], [place] [address]   Type and screen [facility]     Status: None   Collection Time: 07/26/23  7:29 PM  Result Value Ref Range   ABO/RH(D) O POS    Antibody Screen NEG    Sample Expiration      07/29/2023,2359 Performed at [facility], [address] [place], [place] [address]   Comprehensive metabolic panel     Status: Abnormal   Collection Time: 07/26/23  7:29 PM  Result Value Ref Range   Sodium 135 135 - 145 mmol/L   Potassium 4.1 3.5 - 5.1 mmol/L   Chloride 104 98 - 111 mmol/L   CO2 18 (L) 22 - 32 mmol/L   Glucose, Bld 160 (H) 70 - 99 mg/dL    Comment: Glucose reference range applies only to samples taken after fasting for at least 8 hours.   BUN 20 8 - 23 mg/dL   Creatinine, Ser 4.69 0.44 - 1.00 mg/dL   Calcium 8.9 8.9 - 62.9 mg/dL   Total Protein 7.3 6.5 - 8.1 g/dL   Albumin 3.2 (L) 3.5 - 5.0 g/dL   AST 15 15 - 41 U/L   ALT 13 0 - 44 U/L   Alkaline Phosphatase 73 38 - 126 U/L   Total Bilirubin 0.6 0.3 - 1.2 mg/dL   GFR, Estimated >52 >84 mL/min    Comment: (NOTE) Calculated using the CKD-EPI Creatinine Equation (2021)    Anion gap 13 5 - 15    Comment: Performed at [facility], [address] [place], [place] [address]   CT HEAD WO CONTRAST  Result Date: 07/26/2023 CLINICAL DATA:  Polytrauma, blunt.  Patient fell. EXAM: CT HEAD WITHOUT CONTRAST CT CERVICAL SPINE WITHOUT CONTRAST TECHNIQUE: Multidetector CT imaging of the head and cervical spine was performed following the standard protocol without intravenous contrast. Multiplanar CT image reconstructions of the cervical spine were also generated. RADIATION DOSE REDUCTION: This exam was performed according to the departmental  dose-optimization program which includes automated exposure control, adjustment of the mA and/or kV according to patient size and/or use of iterative reconstruction technique. COMPARISON:  CT scan head from 06/12/2023 and CT scan cervical spine from 12/27/2010. FINDINGS: CT HEAD FINDINGS Brain: No evidence of acute infarction, hemorrhage, hydrocephalus, extra-axial collection or mass lesion/mass  effect. Vascular: No hyperdense vessel or unexpected calcification. Skull: Normal. Negative for fracture or focal lesion. Sinuses/Orbits: No acute finding. Other: None. CT CERVICAL SPINE FINDINGS Alignment: Normal. This examination does not assess for ligamentous injury or stability. Note is made of C3-4 ACDF. Skull base and vertebrae: No acute fracture. No primary bone lesion or focal pathologic process. Soft tissues and spinal canal: No prevertebral fluid or swelling. No visible canal hematoma. Disc levels: There are moderate multilevel degenerative changes of the cervical spine characterized by reduced intervertebral disc height, facet arthropathy and marginal osteophyte formation, most pronounced at C4-5 level. Upper chest: Negative. Other: None. IMPRESSION: 1. No acute intracranial abnormality. 2. No acute fracture or traumatic malalignment of the cervical spine. 3. Moderate multilevel degenerative changes of the cervical spine. Electronically Signed   By: Jules Schick M.D.   On: 07/26/2023 20:43   CT CERVICAL SPINE WO CONTRAST  Result Date: 07/26/2023 CLINICAL DATA:  Polytrauma, blunt.  Patient fell. EXAM: CT HEAD WITHOUT CONTRAST CT CERVICAL SPINE WITHOUT CONTRAST TECHNIQUE: Multidetector CT imaging of the head and cervical spine was performed following the standard protocol without intravenous contrast. Multiplanar CT image reconstructions of the cervical spine were also generated. RADIATION DOSE REDUCTION: This exam was performed according to the departmental dose-optimization program which includes automated  exposure control, adjustment of the mA and/or kV according to patient size and/or use of iterative reconstruction technique. COMPARISON:  CT scan head from 06/12/2023 and CT scan cervical spine from 12/27/2010. FINDINGS: CT HEAD FINDINGS Brain: No evidence of acute infarction, hemorrhage, hydrocephalus, extra-axial collection or mass lesion/mass effect. Vascular: No hyperdense vessel or unexpected calcification. Skull: Normal. Negative for fracture or focal lesion. Sinuses/Orbits: No acute finding. Other: None. CT CERVICAL SPINE FINDINGS Alignment: Normal. This examination does not assess for ligamentous injury or stability. Note is made of C3-4 ACDF. Skull base and vertebrae: No acute fracture. No primary bone lesion or focal pathologic process. Soft tissues and spinal canal: No prevertebral fluid or swelling. No visible canal hematoma. Disc levels: There are moderate multilevel degenerative changes of the cervical spine characterized by reduced intervertebral disc height, facet arthropathy and marginal osteophyte formation, most pronounced at C4-5 level. Upper chest: Negative. Other: None. IMPRESSION: 1. No acute intracranial abnormality. 2. No acute fracture or traumatic malalignment of the cervical spine. 3. Moderate multilevel degenerative changes of the cervical spine. Electronically Signed   By: Jules Schick M.D.   On: 07/26/2023 20:43   DG FEMUR MIN 2 VIEWS LEFT  Result Date: 07/26/2023 CLINICAL DATA:  Fall. EXAM: RIGHT FEMUR 2 VIEWS; LEFT FEMUR 2 VIEWS COMPARISON:  None Available. FINDINGS: Examination is limited due to patient related factors. There is comminuted fracture of the distal half of the right femur. There is approximately 1 shaft width displacement. There is also angulated and comminuted fracture of the distal half of the left femur which probably reaches up to the knee transplant femoral prosthesis. Bilateral knee arthroplasty noted. No aggressive osseous lesion. No focal soft tissue  swelling. No radiopaque foreign bodies. IMPRESSION: Bilateral femur fractures. Electronically Signed   By: Jules Schick M.D.   On: 07/26/2023 20:26   DG FEMUR, MIN 2 VIEWS RIGHT  Result Date: 07/26/2023 CLINICAL DATA:  Fall. EXAM: RIGHT FEMUR 2 VIEWS; LEFT FEMUR 2 VIEWS COMPARISON:  None Available. FINDINGS: Examination is limited due to patient related factors. There is comminuted fracture of the distal half of the right femur. There is approximately 1 shaft width displacement. There is also angulated and comminuted  fracture of the distal half of the left femur which probably reaches up to the knee transplant femoral prosthesis. Bilateral knee arthroplasty noted. No aggressive osseous lesion. No focal soft tissue swelling. No radiopaque foreign bodies. IMPRESSION: Bilateral femur fractures. Electronically Signed   By: Jules Schick M.D.   On: 07/26/2023 20:26   DG Pelvis 1-2 Views  Result Date: 07/26/2023 CLINICAL DATA:  [id number] Fall [id number] EXAM: PELVIS - 1-2 VIEW COMPARISON:  None Available. FINDINGS: Examination is markedly limited due to patient related factors. No acute displaced fracture seen. There are changes of chronic pubic symphisitis. There are moderate to severe degenerative changes of bilateral hip joints in the form of reduced joint space, subchondral sclerosis / cystic changes and osteophytosis. No radiopaque foreign bodies. IMPRESSION: *Markedly limited exam. No acute displaced fracture. If there is high clinical concern for fracture, cross-sectional imaging is recommended. Electronically Signed   By: Jules Schick M.D.   On: 07/26/2023 20:23   DG Chest 1 View  Result Date: 07/26/2023 CLINICAL DATA:  fall. EXAM: CHEST  1 VIEW COMPARISON:  11/28/2022. FINDINGS: Bilateral lung fields are clear. Bilateral lateral costophrenic angles are clear. Stable cardio-mediastinal silhouette. Aortic arch calcifications noted. No acute osseous abnormalities. The soft tissues are within normal limits.  IMPRESSION: *No acute cardiopulmonary process. Electronically Signed   By: Jules Schick M.D.   On: 07/26/2023 20:20    Pending Labs Unresulted Labs (From admission, onward)     Start     Ordered   07/27/23 0500  CBC  Tomorrow morning,   R        07/26/23 2219   07/27/23 0500  Basic metabolic panel  Tomorrow morning,   R        07/26/23 2219   07/27/23 0500  Protime-INR  Tomorrow morning,   R        07/26/23 2219   07/26/23 1916  Urinalysis, Routine w reflex microscopic -Urine, Clean Catch  Once,   URGENT       Question:  Specimen Source  Answer:  Urine, Clean Catch   07/26/23 1916            Vitals/Pain Today's Vitals   07/26/23 2130 07/26/23 2217 07/26/23 2230 07/26/23 2311  BP: (!) 155/76  134/71   Pulse: 74  78   Resp: 19     Temp:   98.5 F (36.9 C)   TempSrc:   Oral   SpO2: 98%  91%   PainSc:  8   5     Isolation Precautions No active isolations  Medications Medications  0.9 %  sodium chloride infusion ( Intravenous New Bag/Given 07/26/23 1924)  amLODipine (NORVASC) tablet 10 mg (has no administration in time range)  pantoprazole (PROTONIX) EC tablet 40 mg (has no administration in time range)  latanoprost (XALATAN) 0.005 % ophthalmic solution 1 drop (has no administration in time range)  acetaminophen (TYLENOL) tablet 650 mg (has no administration in time range)    Or  acetaminophen (TYLENOL) suppository 650 mg (has no administration in time range)  oxyCODONE (Oxy IR/ROXICODONE) immediate release tablet 5 mg (has no administration in time range)  fentaNYL (SUBLIMAZE) injection 12.5-50 mcg (has no administration in time range)  methocarbamol (ROBAXIN) 500 mg in dextrose 5 % 50 mL IVPB (has no administration in time range)  ondansetron (ZOFRAN) tablet 4 mg (has no administration in time range)    Or  ondansetron (ZOFRAN) injection 4 mg (has no administration in time range)  morphine (PF) 4  MG/ML injection 4 mg (4 mg Intravenous Given 07/26/23 2113)  ondansetron  [facility]) injection 4 mg (4 mg Intravenous Given 07/26/23 1925)  morphine (PF) 4 MG/ML injection 4 mg (4 mg Intravenous Given 07/26/23 2220)    Mobility Unable to ambulate due to injury     Focused Assessments Ortho   R Recommendations: See Admitting Provider Note  Report given to:   Additional Notes: n/a

## 2023-07-27 ENCOUNTER — Inpatient Hospital Stay (HOSPITAL_COMMUNITY): Payer: Medicare Other

## 2023-07-27 ENCOUNTER — Encounter (HOSPITAL_COMMUNITY): Payer: Self-pay | Admitting: Internal Medicine

## 2023-07-27 ENCOUNTER — Other Ambulatory Visit: Payer: Self-pay

## 2023-07-27 ENCOUNTER — Encounter (HOSPITAL_COMMUNITY)
Admission: EM | Disposition: A | Discharge status: Skilled Nursing Facility | Payer: Self-pay | Source: Home / Self Care | Attending: Internal Medicine

## 2023-07-27 DIAGNOSIS — M9702XA Periprosthetic fracture around internal prosthetic left hip joint, initial encounter: Secondary | ICD-10-CM

## 2023-07-27 DIAGNOSIS — S72401A Unspecified fracture of lower end of right femur, initial encounter for closed fracture: Secondary | ICD-10-CM | POA: Diagnosis not present

## 2023-07-27 DIAGNOSIS — M9701XA Periprosthetic fracture around internal prosthetic right hip joint, initial encounter: Secondary | ICD-10-CM

## 2023-07-27 HISTORY — PX: FEMUR IM NAIL: SHX1597

## 2023-07-27 LAB — GLUCOSE, CAPILLARY
Glucose-Capillary: 91 mg/dL (ref 70–99)
Glucose-Capillary: 122 mg/dL — ABNORMAL HIGH (ref 70–99)
Glucose-Capillary: 122 mg/dL — ABNORMAL HIGH (ref 70–99)
Glucose-Capillary: 185 mg/dL — ABNORMAL HIGH (ref 70–99)

## 2023-07-27 LAB — CREATININE, SERUM
Creatinine, Ser: 0.58 mg/dL (ref 0.44–1.00)
GFR, Estimated: >60 mL/min (ref >60)

## 2023-07-27 LAB — CBC
HCT: 24.7 % — ABNORMAL LOW (ref 36.0–46.0)
Hemoglobin: 7.8 g/dL — ABNORMAL LOW (ref 12.0–15.0)
MCH: 27.6 pg (ref 26.0–34.0)
MCHC: 31.6 g/dL (ref 30.0–36.0)
MCV: 87.3 fL (ref 80.0–100.0)
Platelets: 223 10*3/uL (ref 150–400)
RBC: 2.83 MIL/uL — ABNORMAL LOW (ref 3.87–5.11)
RDW: 16.1 % — ABNORMAL HIGH (ref 11.5–15.5)
WBC: 13.9 10*3/uL — ABNORMAL HIGH (ref 4.0–10.5)
nRBC: 0 % (ref 0.0–0.2)

## 2023-07-27 SURGERY — INSERTION, INTRAMEDULLARY ROD, FEMUR, RETROGRADE
Anesthesia: General | Site: Leg Upper | Laterality: Bilateral

## 2023-07-27 MED ORDER — ACETAMINOPHEN 10 MG/ML IV SOLN
1000.0000 mg | Freq: Once | INTRAVENOUS | Status: DC | PRN
  Administered 2023-07-27: 1000 mg via INTRAVENOUS

## 2023-07-27 MED ORDER — METOCLOPRAMIDE HCL 5 MG PO TABS: 5.0000 mg | ORAL_TABLET | Freq: Three times a day (TID) | ORAL | Status: DC | PRN

## 2023-07-27 MED ORDER — METOCLOPRAMIDE HCL 5 MG/ML IJ SOLN: 5.0000 mg | Freq: Three times a day (TID) | INTRAMUSCULAR | Status: DC | PRN

## 2023-07-27 MED ORDER — ONDANSETRON HCL 4 MG/2ML IJ SOLN: 4.0000 mg | Freq: Once | INTRAMUSCULAR | Status: DC | PRN

## 2023-07-27 MED ORDER — ONDANSETRON HCL 4 MG/2ML IJ SOLN: 4.0000 mg | Freq: Four times a day (QID) | INTRAMUSCULAR | Status: DC | PRN

## 2023-07-27 MED ORDER — POLYETHYLENE GLYCOL 3350 17 G PO PACK: 17.0000 g | PACK | Freq: Every day | ORAL | Status: DC | PRN

## 2023-07-27 MED ORDER — OXYCODONE HCL 5 MG PO TABS: 5.0000 mg | ORAL_TABLET | Freq: Once | ORAL | Status: DC | PRN

## 2023-07-27 MED ORDER — TRANEXAMIC ACID-NACL 1000-0.7 MG/100ML-% IV SOLN
1000.0000 mg | INTRAVENOUS | Status: AC
  Administered 2023-07-27: 1000 mg via INTRAVENOUS

## 2023-07-27 MED ORDER — ONDANSETRON HCL 4 MG/2ML IJ SOLN
INTRAMUSCULAR | Status: AC
  Filled 2023-07-27: qty 2

## 2023-07-27 MED ORDER — CEFAZOLIN SODIUM-DEXTROSE 2-4 GM/100ML-% IV SOLN
2.0000 g | Freq: Three times a day (TID) | INTRAVENOUS | Status: AC
  Administered 2023-07-27 – 2023-07-28 (×3): 2 g via INTRAVENOUS
  Filled 2023-07-27 (×3): qty 100

## 2023-07-27 MED ORDER — 0.9 % SODIUM CHLORIDE (POUR BTL) OPTIME
TOPICAL | Status: DC | PRN
  Administered 2023-07-27: 1000 mL

## 2023-07-27 MED ORDER — SUGAMMADEX SODIUM 200 MG/2ML IV SOLN
INTRAVENOUS | Status: DC | PRN
  Administered 2023-07-27: 200 mg via INTRAVENOUS

## 2023-07-27 MED ORDER — CEFAZOLIN SODIUM-DEXTROSE 2-4 GM/100ML-% IV SOLN
2.0000 g | INTRAVENOUS | Status: AC
  Administered 2023-07-27: 2 g via INTRAVENOUS

## 2023-07-27 MED ORDER — LACTATED RINGERS IV SOLN: INTRAVENOUS | Status: DC

## 2023-07-27 MED ORDER — GLIPIZIDE ER 2.5 MG PO TB24
2.5000 mg | ORAL_TABLET | Freq: Every day | ORAL | Status: DC
  Administered 2023-07-28 – 2023-08-01 (×5): 2.5 mg via ORAL
  Filled 2023-07-27 (×6): qty 1

## 2023-07-27 MED ORDER — ACETAMINOPHEN 10 MG/ML IV SOLN
INTRAVENOUS | Status: AC
  Filled 2023-07-27: qty 100

## 2023-07-27 MED ORDER — ENOXAPARIN SODIUM 40 MG/0.4ML IJ SOSY
40.0000 mg | PREFILLED_SYRINGE | INTRAMUSCULAR | Status: DC
  Administered 2023-07-28 – 2023-08-01 (×5): 40 mg via SUBCUTANEOUS
  Filled 2023-07-27 (×5): qty 0.4

## 2023-07-27 MED ORDER — VANCOMYCIN HCL 1000 MG IV SOLR
INTRAVENOUS | Status: AC
  Filled 2023-07-27: qty 20

## 2023-07-27 MED ORDER — INSULIN ASPART 100 UNIT/ML IJ SOLN: 0.0000 [IU] | INTRAMUSCULAR | Status: DC | PRN

## 2023-07-27 MED ORDER — DIPHENHYDRAMINE HCL 12.5 MG/5ML PO ELIX: 12.5000 mg | ORAL_SOLUTION | ORAL | Status: DC | PRN

## 2023-07-27 MED ORDER — METHOCARBAMOL 1000 MG/10ML IJ SOLN: 500.0000 mg | Freq: Four times a day (QID) | INTRAVENOUS | Status: DC | PRN

## 2023-07-27 MED ORDER — ROCURONIUM BROMIDE 10 MG/ML (PF) SYRINGE
PREFILLED_SYRINGE | INTRAVENOUS | Status: DC | PRN
  Administered 2023-07-27: 60 mg via INTRAVENOUS
  Administered 2023-07-27: 20 mg via INTRAVENOUS

## 2023-07-27 MED ORDER — LIDOCAINE 2% (20 MG/ML) 5 ML SYRINGE
INTRAMUSCULAR | Status: AC
  Filled 2023-07-27: qty 5

## 2023-07-27 MED ORDER — LIDOCAINE 2% (20 MG/ML) 5 ML SYRINGE
INTRAMUSCULAR | Status: DC | PRN
  Administered 2023-07-27: 100 mg via INTRAVENOUS

## 2023-07-27 MED ORDER — ORAL CARE MOUTH RINSE: 15.0000 mL | Freq: Once | OROMUCOSAL | Status: AC

## 2023-07-27 MED ORDER — ONDANSETRON HCL 4 MG PO TABS: 4.0000 mg | ORAL_TABLET | Freq: Four times a day (QID) | ORAL | Status: DC | PRN

## 2023-07-27 MED ORDER — METFORMIN HCL 500 MG PO TABS
500.0000 mg | ORAL_TABLET | Freq: Two times a day (BID) | ORAL | Status: DC
  Administered 2023-07-28: 500 mg via ORAL
  Filled 2023-07-27: qty 1

## 2023-07-27 MED ORDER — FENTANYL CITRATE (PF) 250 MCG/5ML IJ SOLN
INTRAMUSCULAR | Status: AC
  Filled 2023-07-27: qty 5

## 2023-07-27 MED ORDER — CEFAZOLIN SODIUM-DEXTROSE 2-4 GM/100ML-% IV SOLN
INTRAVENOUS | Status: AC
  Filled 2023-07-27: qty 100

## 2023-07-27 MED ORDER — DOCUSATE SODIUM 100 MG PO CAPS
100.0000 mg | ORAL_CAPSULE | Freq: Two times a day (BID) | ORAL | Status: DC
  Administered 2023-07-27 – 2023-07-31 (×8): 100 mg via ORAL
  Filled 2023-07-27 (×11): qty 1

## 2023-07-27 MED ORDER — PHENYLEPHRINE 80 MCG/ML (10ML) SYRINGE FOR IV PUSH (FOR BLOOD PRESSURE SUPPORT)
PREFILLED_SYRINGE | INTRAVENOUS | Status: DC | PRN
  Administered 2023-07-27 (×2): 160 ug via INTRAVENOUS
  Administered 2023-07-27: 80 ug via INTRAVENOUS

## 2023-07-27 MED ORDER — CHLORHEXIDINE GLUCONATE 4 % EX SOLN: 60.0000 mL | Freq: Once | CUTANEOUS | Status: DC

## 2023-07-27 MED ORDER — PROPOFOL 10 MG/ML IV BOLUS
INTRAVENOUS | Status: AC
  Filled 2023-07-27: qty 20

## 2023-07-27 MED ORDER — METHOCARBAMOL 500 MG PO TABS
500.0000 mg | ORAL_TABLET | Freq: Four times a day (QID) | ORAL | Status: DC | PRN
  Administered 2023-07-29 – 2023-07-31 (×4): 500 mg via ORAL
  Filled 2023-07-27 (×4): qty 1

## 2023-07-27 MED ORDER — TRANEXAMIC ACID-NACL 1000-0.7 MG/100ML-% IV SOLN
INTRAVENOUS | Status: AC
  Filled 2023-07-27: qty 100

## 2023-07-27 MED ORDER — FENTANYL CITRATE (PF) 250 MCG/5ML IJ SOLN
INTRAMUSCULAR | Status: DC | PRN
  Administered 2023-07-27: 50 ug via INTRAVENOUS
  Administered 2023-07-27: 100 ug via INTRAVENOUS

## 2023-07-27 MED ORDER — PROPOFOL 10 MG/ML IV BOLUS
INTRAVENOUS | Status: DC | PRN
  Administered 2023-07-27: 90 mg via INTRAVENOUS

## 2023-07-27 MED ORDER — CHLORHEXIDINE GLUCONATE 0.12 % MT SOLN
15.0000 mL | Freq: Once | OROMUCOSAL | Status: AC
  Administered 2023-07-27: 15 mL via OROMUCOSAL

## 2023-07-27 MED ORDER — TRANEXAMIC ACID-NACL 1000-0.7 MG/100ML-% IV SOLN
1000.0000 mg | Freq: Once | INTRAVENOUS | Status: AC
  Administered 2023-07-27: 1000 mg via INTRAVENOUS
  Filled 2023-07-27: qty 100

## 2023-07-27 MED ORDER — POVIDONE-IODINE 10 % EX SWAB
2.0000 | Freq: Once | CUTANEOUS | Status: AC
  Administered 2023-07-27: 2 via TOPICAL

## 2023-07-27 MED ORDER — ROCURONIUM BROMIDE 10 MG/ML (PF) SYRINGE
PREFILLED_SYRINGE | INTRAVENOUS | Status: AC
  Filled 2023-07-27: qty 10

## 2023-07-27 MED ORDER — FENTANYL CITRATE (PF) 100 MCG/2ML IJ SOLN
25.0000 ug | INTRAMUSCULAR | Status: DC | PRN
  Administered 2023-07-27 (×2): 50 ug via INTRAVENOUS

## 2023-07-27 MED ORDER — FENTANYL CITRATE (PF) 100 MCG/2ML IJ SOLN
INTRAMUSCULAR | Status: AC
  Filled 2023-07-27: qty 2

## 2023-07-27 MED ORDER — OXYCODONE HCL 5 MG/5ML PO SOLN: 5.0000 mg | Freq: Once | ORAL | Status: DC | PRN

## 2023-07-27 MED ORDER — VANCOMYCIN HCL 1000 MG IV SOLR
INTRAVENOUS | Status: DC | PRN
Start: 2023-07-27 — End: 2023-07-27
  Administered 2023-07-27: 1000 mg via TOPICAL

## 2023-07-27 SURGICAL SUPPLY — 61 items
ADH SKN CLS APL DERMABOND .7 (GAUZE/BANDAGES/DRESSINGS) ×1
APL PRP STRL LF DISP 70% ISPRP (MISCELLANEOUS) ×1
BAG COUNTER SPONGE SURGICOUNT (BAG) ×2 IMPLANT
BAG SPNG CNTER NS LX DISP (BAG) ×1
BIT DRILL FREE HAND 4.2X185 (DRILL) IMPLANT
BIT DRILL LOCK 4.2X360 (DRILL) IMPLANT
BNDG COHESIVE 4X5 TAN STRL (GAUZE/BANDAGES/DRESSINGS) ×2 IMPLANT
BNDG ELASTIC 4X5.8 VLCR STR LF (GAUZE/BANDAGES/DRESSINGS) ×2 IMPLANT
BNDG ELASTIC 6X5.8 VLCR STR LF (GAUZE/BANDAGES/DRESSINGS) ×2 IMPLANT
BRUSH SCRUB EZ PLAIN DRY (MISCELLANEOUS) ×4 IMPLANT
CHLORAPREP W/TINT 26 (MISCELLANEOUS) ×2 IMPLANT
COVER MAYO STAND STRL (DRAPES) ×2 IMPLANT
COVER SURGICAL LIGHT HANDLE (MISCELLANEOUS) ×4 IMPLANT
DERMABOND ADVANCED .7 DNX12 (GAUZE/BANDAGES/DRESSINGS) ×2 IMPLANT
DRAPE C-ARM 42X72 X-RAY (DRAPES) ×2 IMPLANT
DRAPE C-ARMOR (DRAPES) ×2 IMPLANT
DRAPE HALF SHEET 40X57 (DRAPES) ×4 IMPLANT
DRAPE IMP U-DRAPE 54X76 (DRAPES) ×4 IMPLANT
DRAPE INCISE IOBAN 66X45 STRL (DRAPES) ×2 IMPLANT
DRAPE ORTHO SPLIT 77X108 STRL (DRAPES) ×2
DRAPE SURG 17X23 STRL (DRAPES) ×2 IMPLANT
DRAPE SURG ORHT 6 SPLT 77X108 (DRAPES) ×4 IMPLANT
DRAPE U-SHAPE 47X51 STRL (DRAPES) ×2 IMPLANT
DRESSING MEPILEX FLEX 4X4 (GAUZE/BANDAGES/DRESSINGS) IMPLANT
DRILL FREE HAND 4.2X185 (DRILL) ×2
DRILL LOCK 4.2X360 (DRILL) ×2
DRSG MEPILEX FLEX 4X4 (GAUZE/BANDAGES/DRESSINGS) ×8
ELECT REM PT RETURN 9FT ADLT (ELECTROSURGICAL) ×1
ELECTRODE REM PT RTRN 9FT ADLT (ELECTROSURGICAL) ×2 IMPLANT
GAUZE SPONGE 4X4 12PLY STRL (GAUZE/BANDAGES/DRESSINGS) ×2 IMPLANT
GLOVE BIO SURGEON STRL SZ 6.5 (GLOVE) ×6 IMPLANT
GLOVE BIO SURGEON STRL SZ7.5 (GLOVE) ×8 IMPLANT
GLOVE BIOGEL PI IND STRL 6.5 (GLOVE) ×2 IMPLANT
GLOVE BIOGEL PI IND STRL 7.5 (GLOVE) ×2 IMPLANT
GOWN STRL REUS W/ TWL LRG LVL3 (GOWN DISPOSABLE) ×4 IMPLANT
GOWN STRL REUS W/TWL LRG LVL3 (GOWN DISPOSABLE) ×2
GUIDEROD T2 3X1000 (ROD) IMPLANT
K-WIRE 3X285 (WIRE) ×1
KIT BASIN OR (CUSTOM PROCEDURE TRAY) ×2 IMPLANT
KIT TURNOVER KIT B (KITS) ×2 IMPLANT
KWIRE 3X285 (WIRE) IMPLANT
NAIL IM RETROGR 10X340 (Nail) IMPLANT
PACK ORTHO EXTREMITY (CUSTOM PROCEDURE TRAY) ×2 IMPLANT
PAD ARMBOARD 7.5X6 YLW CONV (MISCELLANEOUS) ×4 IMPLANT
REAMER SHAFT BIXCUT (INSTRUMENTS) IMPLANT
SCREW LOCK ADV 5X55 (Screw) IMPLANT
SCREW LOCK ADV 5X65 (Screw) IMPLANT
SCREW LOCK ADV 5X70 (Screw) IMPLANT
SCREW LOCK ADV 5X80 (Screw) IMPLANT
SCREW LOCK T2 5X40 (Screw) IMPLANT
SPONGE T-LAP 18X18 ~~LOC~~+RFID (SPONGE) ×2 IMPLANT
STAPLER VISISTAT 35W (STAPLE) ×2 IMPLANT
SUT MNCRL AB 3-0 PS2 18 (SUTURE) ×2 IMPLANT
SUT VIC AB 0 CT1 27 (SUTURE)
SUT VIC AB 0 CT1 27XBRD ANBCTR (SUTURE) IMPLANT
SUT VIC AB 2-0 CT1 27 (SUTURE)
SUT VIC AB 2-0 CT1 TAPERPNT 27 (SUTURE) IMPLANT
TOWEL GREEN STERILE (TOWEL DISPOSABLE) ×4 IMPLANT
TOWEL GREEN STERILE FF (TOWEL DISPOSABLE) ×2 IMPLANT
TUBE CONNECTING 12X1/4 (SUCTIONS) ×2 IMPLANT
YANKAUER SUCT BULB TIP NO VENT (SUCTIONS) ×2 IMPLANT

## 2023-07-27 NOTE — Plan of Care (Signed)
progressing 

## 2023-07-27 NOTE — Progress Notes (Signed)
Patient arrived to unit from [facility]. She is AAOX4. Vital signs are stable. She rates her pain 6/10. Patients family is at the bedside.

## 2023-07-27 NOTE — Consult Note (Signed)
Reason for Consult:Bilateral femur fxs Referring Physician: Hartley Barefoot Time called: 0730 Time at bedside: 0920   [name] is an 89 y.o. female.  HPI: [name] was using a lift to transfer to her [facility] when her leg gave way and she fell to her knees. She had immediate bilateral thigh pain and could not get up. She was brought to the ED where x-rays showed bilateral distal periprosthetic femur fxs and orthopedic surgery was consulted. She lives at home with her daughter and does ambulate with a RW.  Past Medical History:  Diagnosis Date   GERD (gastroesophageal reflux disease)    Hypertension    Right knee DJD    Type II diabetes mellitus (HCC)     Past Surgical History:  Procedure Laterality Date   BACK SURGERY     CATARACT EXTRACTION Bilateral 03/2016   CERVICAL DISC SURGERY  12/28/2010   "bulging disc; went in on the left side of my neck" (08/19/2013)   JOINT REPLACEMENT     TOTAL KNEE ARTHROPLASTY Left 03/28/2011   Dr Thurston Hole   TOTAL KNEE ARTHROPLASTY Right 08/19/2013   TOTAL KNEE ARTHROPLASTY Right 08/19/2013   Procedure: TOTAL KNEE ARTHROPLASTY;  Surgeon: Nilda Simmer, MD;  Location: [facility] OR;  Service: Orthopedics;  Laterality: Right;    Family History  Problem Relation Age of Onset   Diabetes Mother    Cerebral aneurysm Father     Social History:  reports that she has never smoked. She has never used smokeless tobacco. She reports that she does not currently use alcohol. She reports that she does not use drugs.  Allergies:  Allergies  Allergen Reactions   Metoprolol Other (See Comments)    "Metoprolol had to be discontinued due to symptomatic bradycardia," per notes on 06/28/2023   Nsaids Other (See Comments)    Uncertain if this truly is an issue   Lisinopril Cough    Medications: I have reviewed the patient's current medications.  Results for orders placed or performed during the hospital encounter of 07/26/23 (from the past 48 hour(s))  CBC with  Differential     Status: Abnormal   Collection Time: 07/26/23  7:29 PM  Result Value Ref Range   WBC 9.8 4.0 - 10.5 K/uL   RBC 3.50 (L) 3.87 - 5.11 MIL/uL   Hemoglobin 9.3 (L) 12.0 - 15.0 g/dL   HCT 52.8 (L) 41.3 - 24.4 %   MCV 83.7 80.0 - 100.0 fL   MCH 26.6 26.0 - 34.0 pg   MCHC 31.7 30.0 - 36.0 g/dL   RDW 01.0 (H) 27.2 - 53.6 %   Platelets 310 150 - 400 K/uL   nRBC 0.0 0.0 - 0.2 %   Neutrophils Relative % 81 %   Neutro Abs 7.8 (H) 1.7 - 7.7 K/uL   Lymphocytes Relative 13 %   Lymphs Abs 1.3 0.7 - 4.0 K/uL   Monocytes Relative 6 %   Monocytes Absolute 0.6 0.1 - 1.0 K/uL   Eosinophils Relative 0 %   Eosinophils Absolute 0.0 0.0 - 0.5 K/uL   Basophils Relative 0 %   Basophils Absolute 0.0 0.0 - 0.1 K/uL   Immature Granulocytes 0 %   Abs Immature Granulocytes 0.04 0.00 - 0.07 K/uL    Comment: Performed at [facility], [address], [place], [place] [address]  Protime-INR     Status: None   Collection Time: 07/26/23  7:29 PM  Result Value Ref Range   Prothrombin Time 14.5 11.4 -  15.2 seconds   INR 1.1 0.8 - 1.2    Comment: (NOTE) INR goal varies based on device and disease states. Performed at [facility], [address], [place], [place] [address]   Type and screen [facility]     Status: None   Collection Time: 07/26/23  7:29 PM  Result Value Ref Range   ABO/RH(D) O POS    Antibody Screen NEG    Sample Expiration      07/29/2023,2359 Performed at [facility], [address], [place], [place] [address]   Comprehensive metabolic panel     Status: Abnormal   Collection Time: 07/26/23  7:29 PM  Result Value Ref Range   Sodium 135 135 - 145 mmol/L   Potassium 4.1 3.5 - 5.1 mmol/L   Chloride 104 98 - 111 mmol/L   CO2 18 (L) 22 - 32 mmol/L   Glucose, Bld 160 (H) 70 - 99 mg/dL    Comment: Glucose reference range applies only to samples taken after fasting for at least 8 hours.   BUN 20 8 - 23  mg/dL   Creatinine, Ser 7.84 0.44 - 1.00 mg/dL   Calcium 8.9 8.9 - 69.6 mg/dL   Total Protein 7.3 6.5 - 8.1 g/dL   Albumin 3.2 (L) 3.5 - 5.0 g/dL   AST 15 15 - 41 U/L   ALT 13 0 - 44 U/L   Alkaline Phosphatase 73 38 - 126 U/L   Total Bilirubin 0.6 0.3 - 1.2 mg/dL   GFR, Estimated >29 >52 mL/min    Comment: (NOTE) Calculated using the CKD-EPI Creatinine Equation (2021)    Anion gap 13 5 - 15    Comment: Performed at [facility], [address], [place], [place] [address]  Glucose, capillary     Status: None   Collection Time: 07/27/23  9:04 AM  Result Value Ref Range   Glucose-Capillary 91 70 - 99 mg/dL    Comment: Glucose reference range applies only to samples taken after fasting for at least 8 hours.   Comment 1 Notify RN     CT HEAD WO CONTRAST  Result Date: 07/26/2023 CLINICAL DATA:  Polytrauma, blunt.  Patient fell. EXAM: CT HEAD WITHOUT CONTRAST CT CERVICAL SPINE WITHOUT CONTRAST TECHNIQUE: Multidetector CT imaging of the head and cervical spine was performed following the standard protocol without intravenous contrast. Multiplanar CT image reconstructions of the cervical spine were also generated. RADIATION DOSE REDUCTION: This exam was performed according to the departmental dose-optimization program which includes automated exposure control, adjustment of the mA and/or kV according to patient size and/or use of iterative reconstruction technique. COMPARISON:  CT scan head from 06/12/2023 and CT scan cervical spine from 12/27/2010. FINDINGS: CT HEAD FINDINGS Brain: No evidence of acute infarction, hemorrhage, hydrocephalus, extra-axial collection or mass lesion/mass effect. Vascular: No hyperdense vessel or unexpected calcification. Skull: Normal. Negative for fracture or focal lesion. Sinuses/Orbits: No acute finding. Other: None. CT CERVICAL SPINE FINDINGS Alignment: Normal. This examination does not assess for ligamentous injury or stability. Note is made of  C3-4 ACDF. Skull base and vertebrae: No acute fracture. No primary bone lesion or focal pathologic process. Soft tissues and spinal canal: No prevertebral fluid or swelling. No visible canal hematoma. Disc levels: There are moderate multilevel degenerative changes of the cervical spine characterized by reduced intervertebral disc height, facet arthropathy and marginal osteophyte formation, most pronounced at C4-5 level. Upper chest: Negative. Other: None. IMPRESSION: 1. No acute  intracranial abnormality. 2. No acute fracture or traumatic malalignment of the cervical spine. 3. Moderate multilevel degenerative changes of the cervical spine. Electronically Signed   By: Jules Schick M.D.   On: 07/26/2023 20:43   CT CERVICAL SPINE WO CONTRAST  Result Date: 07/26/2023 CLINICAL DATA:  Polytrauma, blunt.  Patient fell. EXAM: CT HEAD WITHOUT CONTRAST CT CERVICAL SPINE WITHOUT CONTRAST TECHNIQUE: Multidetector CT imaging of the head and cervical spine was performed following the standard protocol without intravenous contrast. Multiplanar CT image reconstructions of the cervical spine were also generated. RADIATION DOSE REDUCTION: This exam was performed according to the departmental dose-optimization program which includes automated exposure control, adjustment of the mA and/or kV according to patient size and/or use of iterative reconstruction technique. COMPARISON:  CT scan head from 06/12/2023 and CT scan cervical spine from 12/27/2010. FINDINGS: CT HEAD FINDINGS Brain: No evidence of acute infarction, hemorrhage, hydrocephalus, extra-axial collection or mass lesion/mass effect. Vascular: No hyperdense vessel or unexpected calcification. Skull: Normal. Negative for fracture or focal lesion. Sinuses/Orbits: No acute finding. Other: None. CT CERVICAL SPINE FINDINGS Alignment: Normal. This examination does not assess for ligamentous injury or stability. Note is made of C3-4 ACDF. Skull base and vertebrae: No acute  fracture. No primary bone lesion or focal pathologic process. Soft tissues and spinal canal: No prevertebral fluid or swelling. No visible canal hematoma. Disc levels: There are moderate multilevel degenerative changes of the cervical spine characterized by reduced intervertebral disc height, facet arthropathy and marginal osteophyte formation, most pronounced at C4-5 level. Upper chest: Negative. Other: None. IMPRESSION: 1. No acute intracranial abnormality. 2. No acute fracture or traumatic malalignment of the cervical spine. 3. Moderate multilevel degenerative changes of the cervical spine. Electronically Signed   By: Jules Schick M.D.   On: 07/26/2023 20:43   DG FEMUR MIN 2 VIEWS LEFT  Result Date: 07/26/2023 CLINICAL DATA:  Fall. EXAM: RIGHT FEMUR 2 VIEWS; LEFT FEMUR 2 VIEWS COMPARISON:  None Available. FINDINGS: Examination is limited due to patient related factors. There is comminuted fracture of the distal half of the right femur. There is approximately 1 shaft width displacement. There is also angulated and comminuted fracture of the distal half of the left femur which probably reaches up to the knee transplant femoral prosthesis. Bilateral knee arthroplasty noted. No aggressive osseous lesion. No focal soft tissue swelling. No radiopaque foreign bodies. IMPRESSION: Bilateral femur fractures. Electronically Signed   By: Jules Schick M.D.   On: 07/26/2023 20:26   DG FEMUR, MIN 2 VIEWS RIGHT  Result Date: 07/26/2023 CLINICAL DATA:  Fall. EXAM: RIGHT FEMUR 2 VIEWS; LEFT FEMUR 2 VIEWS COMPARISON:  None Available. FINDINGS: Examination is limited due to patient related factors. There is comminuted fracture of the distal half of the right femur. There is approximately 1 shaft width displacement. There is also angulated and comminuted fracture of the distal half of the left femur which probably reaches up to the knee transplant femoral prosthesis. Bilateral knee arthroplasty noted. No aggressive osseous  lesion. No focal soft tissue swelling. No radiopaque foreign bodies. IMPRESSION: Bilateral femur fractures. Electronically Signed   By: Jules Schick M.D.   On: 07/26/2023 20:26   DG Pelvis 1-2 Views  Result Date: 07/26/2023 CLINICAL DATA:  [id number] Fall [id number] EXAM: PELVIS - 1-2 VIEW COMPARISON:  None Available. FINDINGS: Examination is markedly limited due to patient related factors. No acute displaced fracture seen. There are changes of chronic pubic symphisitis. There are moderate to severe degenerative changes of bilateral hip  joints in the form of reduced joint space, subchondral sclerosis / cystic changes and osteophytosis. No radiopaque foreign bodies. IMPRESSION: *Markedly limited exam. No acute displaced fracture. If there is high clinical concern for fracture, cross-sectional imaging is recommended. Electronically Signed   By: Jules Schick M.D.   On: 07/26/2023 20:23   DG Chest 1 View  Result Date: 07/26/2023 CLINICAL DATA:  fall. EXAM: CHEST  1 VIEW COMPARISON:  11/28/2022. FINDINGS: Bilateral lung fields are clear. Bilateral lateral costophrenic angles are clear. Stable cardio-mediastinal silhouette. Aortic arch calcifications noted. No acute osseous abnormalities. The soft tissues are within normal limits. IMPRESSION: *No acute cardiopulmonary process. Electronically Signed   By: Jules Schick M.D.   On: 07/26/2023 20:20    Review of Systems  HENT:  Negative for ear discharge, ear pain, hearing loss and tinnitus.   Eyes:  Negative for photophobia and pain.  Respiratory:  Negative for cough and shortness of breath.   Cardiovascular:  Negative for chest pain.  Gastrointestinal:  Negative for abdominal pain, nausea and vomiting.  Genitourinary:  Negative for dysuria, flank pain, frequency and urgency.  Musculoskeletal:  Positive for arthralgias (Bilateral thighs). Negative for back pain, myalgias and neck pain.  Neurological:  Negative for dizziness and headaches.  Hematological:  Does  not bruise/bleed easily.  Psychiatric/Behavioral:  The patient is not nervous/anxious.    Blood pressure (!) 160/62, pulse 66, temperature 98.4 F (36.9 C), temperature source Oral, resp. rate 17, SpO2 99%. Physical Exam Constitutional:      General: She is not in acute distress.    Appearance: She is well-developed. She is not diaphoretic.  HENT:     Head: Normocephalic and atraumatic.  Eyes:     General: No scleral icterus.       Right eye: No discharge.        Left eye: No discharge.     Conjunctiva/sclera: Conjunctivae normal.  Cardiovascular:     Rate and Rhythm: Normal rate and regular rhythm.  Pulmonary:     Effort: Pulmonary effort is normal. No respiratory distress.  Musculoskeletal:     Cervical back: Normal range of motion.     Comments: BLE No traumatic wounds, ecchymosis, or rash  KI in place bilaterally  No ankle effusion  Sens DPN, SPN, TN intact  Motor EHL, ext, flex, evers 5/5  DP 1+, PT 1+, 3+ edema  Skin:    General: Skin is warm and dry.  Neurological:     Mental Status: She is alert.  Psychiatric:        Mood and Affect: Mood normal.        Behavior: Behavior normal.     Assessment/Plan: Bilateral femur fxs -- Plan bilateral IMN today with Dr. Jena Gauss. Please keep NPO.    Freeman Caldron, PA-C Orthopedic Surgery [phone] 07/27/2023, 9:24 AM

## 2023-07-27 NOTE — ED Notes (Signed)
Transported to [employer] by [employer].

## 2023-07-27 NOTE — Progress Notes (Signed)
[facility] Pre-op Review  Pre-op checklist: asked floor RN to complete  NPO: since yesterday evening  Labs: WNL  Consent: no order, needs to be obtained  H&P: not completed  Vitals: WNL  O2 requirements: RA 97%  MAR/PTA review: completed  IV: 20g LH  Floor nurse name:  Alphonza RN  Additional info:

## 2023-07-27 NOTE — ED Notes (Signed)
Attempted to call 6N to give verbal report. Did not get answer on phone. Will attempt to call back again

## 2023-07-27 NOTE — Progress Notes (Signed)
PROGRESS NOTE    [name]  OZD:[id number] DOB: 07/04/1932 DOA: 07/26/2023 PCP: Duanne Limerick, MD   Brief Narrative: 89 year old with past medical history significant for GERD, hypertension, diabetes type 2 presented to the ED after mechanical fall, and fever evaluation x-ray left and right femur demonstrated bilateral femur fracture.    Assessment & Plan:   Principal Problem:   Femur fracture (HCC)  1-Mechanical fall, bilateral femur fracture Orthopedic consulted patient underwent surgery 9/5. Pain management per ortho.  DVT prophylaxis per ortho Bowel regimen,.   2-Hypertension: Continue with amlodipine.  Agree with holding ARB  GERD: Continue with PPI  Glaucoma: Continue with eyedrops  Diabetes type 2: Check CBG and sliding scale insulin   Pressure Injury 06/13/23 Buttocks Right;Left Stage 2 -  Partial thickness loss of dermis presenting as a shallow open injury with a red, pink wound bed without slough. healing pressure injury. no drainage noted. pt states she sees wound care regularly. (Active)  06/13/23   Location: Buttocks  Location Orientation: Right;Left  Staging: Stage 2 -  Partial thickness loss of dermis presenting as a shallow open injury with a red, pink wound bed without slough.  Wound Description (Comments): healing pressure injury. no drainage noted. pt states she sees wound care regularly.  Present on Admission: Yes       Estimated body mass index is 28.87 kg/m as calculated from the following:   Height as of 06/28/23: 5\' 3"  (1.6 m).   Weight as of 06/28/23: 73.9 kg.   DVT prophylaxis: SCD Code Status: Full code Family Communication: Disposition Plan:  Status is: Inpatient Remains inpatient appropriate because: femur fracture    Consultants:  ortho  Procedures:    Antimicrobials:    Subjective: Came from surgery. She is alert. She didn't realized she had surgery.   Objective: Vitals:   07/27/23 0029 07/27/23 0455 07/27/23  0912 07/27/23 0935  BP: (!) 165/65 118/68 (!) 160/62 129/63  Pulse: 74 70 66 60  Resp: 18 20 17 16   Temp: 98.3 F (36.8 C) 97.6 F (36.4 C) 98.4 F (36.9 C) 98.5 F (36.9 C)  TempSrc: Oral Oral Oral Oral  SpO2: 99% 97% 99% 94%   No intake or output data in the 24 hours ending 07/27/23 1309 There were no vitals filed for this visit.  Examination:  General exam: Appears calm and comfortable  Respiratory system: Clear to auscultation. Respiratory effort normal. Cardiovascular system: S1 & S2 heard, RRR. No JVD, murmurs, rubs, gallops or clicks. No pedal edema. Gastrointestinal system: Abdomen is nondistended, soft and nontender. No organomegaly or masses felt. Normal bowel sounds heard. Central nervous system: Alert and oriented. Extremities: BL LE dressing BL knee     Data Reviewed: I have personally reviewed following labs and imaging studies  CBC: Recent Labs  Lab 07/26/23 1929  WBC 9.8  NEUTROABS 7.8*  HGB 9.3*  HCT 29.3*  MCV 83.7  PLT 310   Basic Metabolic Panel: Recent Labs  Lab 07/26/23 1929  NA 135  K 4.1  CL 104  CO2 18*  GLUCOSE 160*  BUN 20  CREATININE 0.67  CALCIUM 8.9   GFR: CrCl cannot be calculated (Unknown ideal weight.). Liver Function Tests: Recent Labs  Lab 07/26/23 1929  AST 15  ALT 13  ALKPHOS 73  BILITOT 0.6  PROT 7.3  ALBUMIN 3.2*   No results for input(s): "LIPASE", "AMYLASE" in the last 168 hours. No results for input(s): "AMMONIA" in the last 168 hours. Coagulation Profile:  Recent Labs  Lab 07/26/23 1929  INR 1.1   Cardiac Enzymes: No results for input(s): "CKTOTAL", "CKMB", "CKMBINDEX", "TROPONINI" in the last 168 hours. BNP (last 3 results) No results for input(s): "PROBNP" in the last 8760 hours. HbA1C: No results for input(s): "HGBA1C" in the last 72 hours. CBG: Recent Labs  Lab 07/27/23 0904  GLUCAP 91   Lipid Profile: No results for input(s): "CHOL", "HDL", "LDLCALC", "TRIG", "CHOLHDL", "LDLDIRECT" in  the last 72 hours. Thyroid Function Tests: No results for input(s): "TSH", "T4TOTAL", "FREET4", "T3FREE", "THYROIDAB" in the last 72 hours. Anemia Panel: No results for input(s): "VITAMINB12", "FOLATE", "FERRITIN", "TIBC", "IRON", "RETICCTPCT" in the last 72 hours. Sepsis Labs: No results for input(s): "PROCALCITON", "LATICACIDVEN" in the last 168 hours.  No results found for this or any previous visit (from the past 240 hour(s)).       Radiology Studies: CT HEAD WO CONTRAST  Result Date: 07/26/2023 CLINICAL DATA:  Polytrauma, blunt.  Patient fell. EXAM: CT HEAD WITHOUT CONTRAST CT CERVICAL SPINE WITHOUT CONTRAST TECHNIQUE: Multidetector CT imaging of the head and cervical spine was performed following the standard protocol without intravenous contrast. Multiplanar CT image reconstructions of the cervical spine were also generated. RADIATION DOSE REDUCTION: This exam was performed according to the departmental dose-optimization program which includes automated exposure control, adjustment of the mA and/or kV according to patient size and/or use of iterative reconstruction technique. COMPARISON:  CT scan head from 06/12/2023 and CT scan cervical spine from 12/27/2010. FINDINGS: CT HEAD FINDINGS Brain: No evidence of acute infarction, hemorrhage, hydrocephalus, extra-axial collection or mass lesion/mass effect. Vascular: No hyperdense vessel or unexpected calcification. Skull: Normal. Negative for fracture or focal lesion. Sinuses/Orbits: No acute finding. Other: None. CT CERVICAL SPINE FINDINGS Alignment: Normal. This examination does not assess for ligamentous injury or stability. Note is made of C3-4 ACDF. Skull base and vertebrae: No acute fracture. No primary bone lesion or focal pathologic process. Soft tissues and spinal canal: No prevertebral fluid or swelling. No visible canal hematoma. Disc levels: There are moderate multilevel degenerative changes of the cervical spine characterized by  reduced intervertebral disc height, facet arthropathy and marginal osteophyte formation, most pronounced at C4-5 level. Upper chest: Negative. Other: None. IMPRESSION: 1. No acute intracranial abnormality. 2. No acute fracture or traumatic malalignment of the cervical spine. 3. Moderate multilevel degenerative changes of the cervical spine. Electronically Signed   By: Jules Schick M.D.   On: 07/26/2023 20:43   CT CERVICAL SPINE WO CONTRAST  Result Date: 07/26/2023 CLINICAL DATA:  Polytrauma, blunt.  Patient fell. EXAM: CT HEAD WITHOUT CONTRAST CT CERVICAL SPINE WITHOUT CONTRAST TECHNIQUE: Multidetector CT imaging of the head and cervical spine was performed following the standard protocol without intravenous contrast. Multiplanar CT image reconstructions of the cervical spine were also generated. RADIATION DOSE REDUCTION: This exam was performed according to the departmental dose-optimization program which includes automated exposure control, adjustment of the mA and/or kV according to patient size and/or use of iterative reconstruction technique. COMPARISON:  CT scan head from 06/12/2023 and CT scan cervical spine from 12/27/2010. FINDINGS: CT HEAD FINDINGS Brain: No evidence of acute infarction, hemorrhage, hydrocephalus, extra-axial collection or mass lesion/mass effect. Vascular: No hyperdense vessel or unexpected calcification. Skull: Normal. Negative for fracture or focal lesion. Sinuses/Orbits: No acute finding. Other: None. CT CERVICAL SPINE FINDINGS Alignment: Normal. This examination does not assess for ligamentous injury or stability. Note is made of C3-4 ACDF. Skull base and vertebrae: No acute fracture. No primary bone lesion or  focal pathologic process. Soft tissues and spinal canal: No prevertebral fluid or swelling. No visible canal hematoma. Disc levels: There are moderate multilevel degenerative changes of the cervical spine characterized by reduced intervertebral disc height, facet  arthropathy and marginal osteophyte formation, most pronounced at C4-5 level. Upper chest: Negative. Other: None. IMPRESSION: 1. No acute intracranial abnormality. 2. No acute fracture or traumatic malalignment of the cervical spine. 3. Moderate multilevel degenerative changes of the cervical spine. Electronically Signed   By: Jules Schick M.D.   On: 07/26/2023 20:43   DG FEMUR MIN 2 VIEWS LEFT  Result Date: 07/26/2023 CLINICAL DATA:  Fall. EXAM: RIGHT FEMUR 2 VIEWS; LEFT FEMUR 2 VIEWS COMPARISON:  None Available. FINDINGS: Examination is limited due to patient related factors. There is comminuted fracture of the distal half of the right femur. There is approximately 1 shaft width displacement. There is also angulated and comminuted fracture of the distal half of the left femur which probably reaches up to the knee transplant femoral prosthesis. Bilateral knee arthroplasty noted. No aggressive osseous lesion. No focal soft tissue swelling. No radiopaque foreign bodies. IMPRESSION: Bilateral femur fractures. Electronically Signed   By: Jules Schick M.D.   On: 07/26/2023 20:26   DG FEMUR, MIN 2 VIEWS RIGHT  Result Date: 07/26/2023 CLINICAL DATA:  Fall. EXAM: RIGHT FEMUR 2 VIEWS; LEFT FEMUR 2 VIEWS COMPARISON:  None Available. FINDINGS: Examination is limited due to patient related factors. There is comminuted fracture of the distal half of the right femur. There is approximately 1 shaft width displacement. There is also angulated and comminuted fracture of the distal half of the left femur which probably reaches up to the knee transplant femoral prosthesis. Bilateral knee arthroplasty noted. No aggressive osseous lesion. No focal soft tissue swelling. No radiopaque foreign bodies. IMPRESSION: Bilateral femur fractures. Electronically Signed   By: Jules Schick M.D.   On: 07/26/2023 20:26   DG Pelvis 1-2 Views  Result Date: 07/26/2023 CLINICAL DATA:  [id number] Fall [id number] EXAM: PELVIS - 1-2 VIEW COMPARISON:   None Available. FINDINGS: Examination is markedly limited due to patient related factors. No acute displaced fracture seen. There are changes of chronic pubic symphisitis. There are moderate to severe degenerative changes of bilateral hip joints in the form of reduced joint space, subchondral sclerosis / cystic changes and osteophytosis. No radiopaque foreign bodies. IMPRESSION: *Markedly limited exam. No acute displaced fracture. If there is high clinical concern for fracture, cross-sectional imaging is recommended. Electronically Signed   By: Jules Schick M.D.   On: 07/26/2023 20:23   DG Chest 1 View  Result Date: 07/26/2023 CLINICAL DATA:  fall. EXAM: CHEST  1 VIEW COMPARISON:  11/28/2022. FINDINGS: Bilateral lung fields are clear. Bilateral lateral costophrenic angles are clear. Stable cardio-mediastinal silhouette. Aortic arch calcifications noted. No acute osseous abnormalities. The soft tissues are within normal limits. IMPRESSION: *No acute cardiopulmonary process. Electronically Signed   By: Jules Schick M.D.   On: 07/26/2023 20:20        Scheduled Meds:  [MAR Hold] amLODipine  10 mg Oral Daily   chlorhexidine  60 mL Topical Once   [MAR Hold] latanoprost  1 drop Both Eyes QHS   [MAR Hold] pantoprazole  40 mg Oral Daily   Continuous Infusions:  sodium chloride 125 mL/hr at 07/27/23 0114   lactated ringers 10 mL/hr at 07/27/23 1106   [MAR Hold] methocarbamol (ROBAXIN) IV       LOS: 1 day    Time spent: 35 minutes  Alba Cory, MD [facility]   If 7PM-7AM, please contact night-coverage [email or web address]  07/27/2023, 1:09 PM

## 2023-07-27 NOTE — Plan of Care (Signed)
  Problem: Safety: Goal: Ability to remain free from injury will improve Outcome: Progressing   Problem: Pain Managment: Goal: General experience of comfort will improve Outcome: Progressing   Problem: Activity: Goal: Risk for activity intolerance will decrease Outcome: Progressing   Problem: Skin Integrity: Goal: Risk for impaired skin integrity will decrease Outcome: Progressing   Problem: Education: Goal: Knowledge of General Education information will improve Description: Including pain rating scale, medication(s)/side effects and non-pharmacologic comfort measures Outcome: Progressing

## 2023-07-27 NOTE — Transfer of Care (Signed)
Immediate Anesthesia Transfer of Care Note  Patient: [name]  Procedure(s) Performed: INTRAMEDULLARY (IM) RETROGRADE FEMORAL NAILING (Bilateral: Leg Upper)  Patient Location: PACU  Anesthesia Type:General  Level of Consciousness: awake, alert , and oriented  Airway & Oxygen Therapy: Patient Spontanous Breathing and Patient connected to face mask oxygen  Post-op Assessment: Report given to RN and Post -op Vital signs reviewed and stable  Post vital signs: Reviewed and stable  Last Vitals:  Vitals Value Taken Time  BP 130/63 07/27/23 1320  Temp    Pulse 67 07/27/23 1324  Resp 16 07/27/23 1324  SpO2 100 % 07/27/23 1324  Vitals shown include unfiled device data.  Last Pain:  Vitals:   07/27/23 0935  TempSrc: Oral  PainSc: 7          Complications: No notable events documented.

## 2023-07-27 NOTE — Interval H&P Note (Signed)
History and Physical Interval Note:  07/27/2023 10:05 AM  [name]  has presented today for surgery, with the diagnosis of Bilateral femur fractures.  The various methods of treatment have been discussed with the patient and family. After consideration of risks, benefits and other options for treatment, the patient has consented to  Procedure(s): INTRAMEDULLARY (IM) RETROGRADE FEMORAL NAILING (Bilateral) as a surgical intervention.  The patient's history has been reviewed, patient examined, no change in status, stable for surgery.  I have reviewed the patient's chart and labs.  Questions were answered to the patient's satisfaction.     Caryn Bee P Morgan Rennert

## 2023-07-27 NOTE — H&P (View-Only) (Signed)
Reason for Consult:Bilateral femur fxs Referring Physician: Hartley Barefoot Time called: 0730 Time at bedside: 0920   AAMARI Reynolds is an 89 y.o. female.  HPI: Shecid was using a lift to transfer to her [facility] when her leg gave way and she fell to her knees. She had immediate bilateral thigh pain and could not get up. She was brought to the ED where x-rays showed bilateral distal periprosthetic femur fxs and orthopedic surgery was consulted. She lives at home with her daughter and does ambulate with a RW.  Past Medical History:  Diagnosis Date   GERD (gastroesophageal reflux disease)    Hypertension    Right knee DJD    Type II diabetes mellitus (HCC)     Past Surgical History:  Procedure Laterality Date   BACK SURGERY     CATARACT EXTRACTION Bilateral 03/2016   CERVICAL DISC SURGERY  12/28/2010   "bulging disc; went in on the left side of my neck" (08/19/2013)   JOINT REPLACEMENT     TOTAL KNEE ARTHROPLASTY Left 03/28/2011   Dr Thurston Hole   TOTAL KNEE ARTHROPLASTY Right 08/19/2013   TOTAL KNEE ARTHROPLASTY Right 08/19/2013   Procedure: TOTAL KNEE ARTHROPLASTY;  Surgeon: Nilda Simmer, MD;  Location: [facility] OR;  Service: Orthopedics;  Laterality: Right;    Family History  Problem Relation Age of Onset   Diabetes Mother    Cerebral aneurysm Father     Social History:  reports that she has never smoked. She has never used smokeless tobacco. She reports that she does not currently use alcohol. She reports that she does not use drugs.  Allergies:  Allergies  Allergen Reactions   Metoprolol Other (See Comments)    "Metoprolol had to be discontinued due to symptomatic bradycardia," per notes on 06/28/2023   Nsaids Other (See Comments)    Uncertain if this truly is an issue   Lisinopril Cough    Medications: I have reviewed the patient's current medications.  Results for orders placed or performed during the hospital encounter of 07/26/23 (from the past 48 hour(s))  CBC with  Differential     Status: Abnormal   Collection Time: 07/26/23  7:29 PM  Result Value Ref Range   WBC 9.8 4.0 - 10.5 K/uL   RBC 3.50 (L) 3.87 - 5.11 MIL/uL   Hemoglobin 9.3 (L) 12.0 - 15.0 g/dL   HCT 52.8 (L) 41.3 - 24.4 %   MCV 83.7 80.0 - 100.0 fL   MCH 26.6 26.0 - 34.0 pg   MCHC 31.7 30.0 - 36.0 g/dL   RDW 01.0 (H) 27.2 - 53.6 %   Platelets 310 150 - 400 K/uL   nRBC 0.0 0.0 - 0.2 %   Neutrophils Relative % 81 %   Neutro Abs 7.8 (H) 1.7 - 7.7 K/uL   Lymphocytes Relative 13 %   Lymphs Abs 1.3 0.7 - 4.0 K/uL   Monocytes Relative 6 %   Monocytes Absolute 0.6 0.1 - 1.0 K/uL   Eosinophils Relative 0 %   Eosinophils Absolute 0.0 0.0 - 0.5 K/uL   Basophils Relative 0 %   Basophils Absolute 0.0 0.0 - 0.1 K/uL   Immature Granulocytes 0 %   Abs Immature Granulocytes 0.04 0.00 - 0.07 K/uL    Comment: Performed at [facility], [address], [place], [place] [address]  Protime-INR     Status: None   Collection Time: 07/26/23  7:29 PM  Result Value Ref Range   Prothrombin Time 14.5 11.4 -  15.2 seconds   INR 1.1 0.8 - 1.2    Comment: (NOTE) INR goal varies based on device and disease states. Performed at [facility], [address], [place], [place] [address]   Type and screen [facility]     Status: None   Collection Time: 07/26/23  7:29 PM  Result Value Ref Range   ABO/RH(D) O POS    Antibody Screen NEG    Sample Expiration      07/29/2023,2359 Performed at [facility], [address], [place], [place] [address]   Comprehensive metabolic panel     Status: Abnormal   Collection Time: 07/26/23  7:29 PM  Result Value Ref Range   Sodium 135 135 - 145 mmol/L   Potassium 4.1 3.5 - 5.1 mmol/L   Chloride 104 98 - 111 mmol/L   CO2 18 (L) 22 - 32 mmol/L   Glucose, Bld 160 (H) 70 - 99 mg/dL    Comment: Glucose reference range applies only to samples taken after fasting for at least 8 hours.   BUN 20 8 - 23  mg/dL   Creatinine, Ser 7.84 0.44 - 1.00 mg/dL   Calcium 8.9 8.9 - 69.6 mg/dL   Total Protein 7.3 6.5 - 8.1 g/dL   Albumin 3.2 (L) 3.5 - 5.0 g/dL   AST 15 15 - 41 U/L   ALT 13 0 - 44 U/L   Alkaline Phosphatase 73 38 - 126 U/L   Total Bilirubin 0.6 0.3 - 1.2 mg/dL   GFR, Estimated >29 >52 mL/min    Comment: (NOTE) Calculated using the CKD-EPI Creatinine Equation (2021)    Anion gap 13 5 - 15    Comment: Performed at [facility], [address], [place], [place] [address]  Glucose, capillary     Status: None   Collection Time: 07/27/23  9:04 AM  Result Value Ref Range   Glucose-Capillary 91 70 - 99 mg/dL    Comment: Glucose reference range applies only to samples taken after fasting for at least 8 hours.   Comment 1 Notify RN     CT HEAD WO CONTRAST  Result Date: 07/26/2023 CLINICAL DATA:  Polytrauma, blunt.  Patient fell. EXAM: CT HEAD WITHOUT CONTRAST CT CERVICAL SPINE WITHOUT CONTRAST TECHNIQUE: Multidetector CT imaging of the head and cervical spine was performed following the standard protocol without intravenous contrast. Multiplanar CT image reconstructions of the cervical spine were also generated. RADIATION DOSE REDUCTION: This exam was performed according to the departmental dose-optimization program which includes automated exposure control, adjustment of the mA and/or kV according to patient size and/or use of iterative reconstruction technique. COMPARISON:  CT scan head from 06/12/2023 and CT scan cervical spine from 12/27/2010. FINDINGS: CT HEAD FINDINGS Brain: No evidence of acute infarction, hemorrhage, hydrocephalus, extra-axial collection or mass lesion/mass effect. Vascular: No hyperdense vessel or unexpected calcification. Skull: Normal. Negative for fracture or focal lesion. Sinuses/Orbits: No acute finding. Other: None. CT CERVICAL SPINE FINDINGS Alignment: Normal. This examination does not assess for ligamentous injury or stability. Note is made of  C3-4 ACDF. Skull base and vertebrae: No acute fracture. No primary bone lesion or focal pathologic process. Soft tissues and spinal canal: No prevertebral fluid or swelling. No visible canal hematoma. Disc levels: There are moderate multilevel degenerative changes of the cervical spine characterized by reduced intervertebral disc height, facet arthropathy and marginal osteophyte formation, most pronounced at C4-5 level. Upper chest: Negative. Other: None. IMPRESSION: 1. No acute  intracranial abnormality. 2. No acute fracture or traumatic malalignment of the cervical spine. 3. Moderate multilevel degenerative changes of the cervical spine. Electronically Signed   By: Jules Schick M.D.   On: 07/26/2023 20:43   CT CERVICAL SPINE WO CONTRAST  Result Date: 07/26/2023 CLINICAL DATA:  Polytrauma, blunt.  Patient fell. EXAM: CT HEAD WITHOUT CONTRAST CT CERVICAL SPINE WITHOUT CONTRAST TECHNIQUE: Multidetector CT imaging of the head and cervical spine was performed following the standard protocol without intravenous contrast. Multiplanar CT image reconstructions of the cervical spine were also generated. RADIATION DOSE REDUCTION: This exam was performed according to the departmental dose-optimization program which includes automated exposure control, adjustment of the mA and/or kV according to patient size and/or use of iterative reconstruction technique. COMPARISON:  CT scan head from 06/12/2023 and CT scan cervical spine from 12/27/2010. FINDINGS: CT HEAD FINDINGS Brain: No evidence of acute infarction, hemorrhage, hydrocephalus, extra-axial collection or mass lesion/mass effect. Vascular: No hyperdense vessel or unexpected calcification. Skull: Normal. Negative for fracture or focal lesion. Sinuses/Orbits: No acute finding. Other: None. CT CERVICAL SPINE FINDINGS Alignment: Normal. This examination does not assess for ligamentous injury or stability. Note is made of C3-4 ACDF. Skull base and vertebrae: No acute  fracture. No primary bone lesion or focal pathologic process. Soft tissues and spinal canal: No prevertebral fluid or swelling. No visible canal hematoma. Disc levels: There are moderate multilevel degenerative changes of the cervical spine characterized by reduced intervertebral disc height, facet arthropathy and marginal osteophyte formation, most pronounced at C4-5 level. Upper chest: Negative. Other: None. IMPRESSION: 1. No acute intracranial abnormality. 2. No acute fracture or traumatic malalignment of the cervical spine. 3. Moderate multilevel degenerative changes of the cervical spine. Electronically Signed   By: Jules Schick M.D.   On: 07/26/2023 20:43   DG FEMUR MIN 2 VIEWS LEFT  Result Date: 07/26/2023 CLINICAL DATA:  Fall. EXAM: RIGHT FEMUR 2 VIEWS; LEFT FEMUR 2 VIEWS COMPARISON:  None Available. FINDINGS: Examination is limited due to patient related factors. There is comminuted fracture of the distal half of the right femur. There is approximately 1 shaft width displacement. There is also angulated and comminuted fracture of the distal half of the left femur which probably reaches up to the knee transplant femoral prosthesis. Bilateral knee arthroplasty noted. No aggressive osseous lesion. No focal soft tissue swelling. No radiopaque foreign bodies. IMPRESSION: Bilateral femur fractures. Electronically Signed   By: Jules Schick M.D.   On: 07/26/2023 20:26   DG FEMUR, MIN 2 VIEWS RIGHT  Result Date: 07/26/2023 CLINICAL DATA:  Fall. EXAM: RIGHT FEMUR 2 VIEWS; LEFT FEMUR 2 VIEWS COMPARISON:  None Available. FINDINGS: Examination is limited due to patient related factors. There is comminuted fracture of the distal half of the right femur. There is approximately 1 shaft width displacement. There is also angulated and comminuted fracture of the distal half of the left femur which probably reaches up to the knee transplant femoral prosthesis. Bilateral knee arthroplasty noted. No aggressive osseous  lesion. No focal soft tissue swelling. No radiopaque foreign bodies. IMPRESSION: Bilateral femur fractures. Electronically Signed   By: Jules Schick M.D.   On: 07/26/2023 20:26   DG Pelvis 1-2 Views  Result Date: 07/26/2023 CLINICAL DATA:  [id number] Fall [id number] EXAM: PELVIS - 1-2 VIEW COMPARISON:  None Available. FINDINGS: Examination is markedly limited due to patient related factors. No acute displaced fracture seen. There are changes of chronic pubic symphisitis. There are moderate to severe degenerative changes of bilateral hip  joints in the form of reduced joint space, subchondral sclerosis / cystic changes and osteophytosis. No radiopaque foreign bodies. IMPRESSION: *Markedly limited exam. No acute displaced fracture. If there is high clinical concern for fracture, cross-sectional imaging is recommended. Electronically Signed   By: Jules Schick M.D.   On: 07/26/2023 20:23   DG Chest 1 View  Result Date: 07/26/2023 CLINICAL DATA:  fall. EXAM: CHEST  1 VIEW COMPARISON:  11/28/2022. FINDINGS: Bilateral lung fields are clear. Bilateral lateral costophrenic angles are clear. Stable cardio-mediastinal silhouette. Aortic arch calcifications noted. No acute osseous abnormalities. The soft tissues are within normal limits. IMPRESSION: *No acute cardiopulmonary process. Electronically Signed   By: Jules Schick M.D.   On: 07/26/2023 20:20    Review of Systems  HENT:  Negative for ear discharge, ear pain, hearing loss and tinnitus.   Eyes:  Negative for photophobia and pain.  Respiratory:  Negative for cough and shortness of breath.   Cardiovascular:  Negative for chest pain.  Gastrointestinal:  Negative for abdominal pain, nausea and vomiting.  Genitourinary:  Negative for dysuria, flank pain, frequency and urgency.  Musculoskeletal:  Positive for arthralgias (Bilateral thighs). Negative for back pain, myalgias and neck pain.  Neurological:  Negative for dizziness and headaches.  Hematological:  Does  not bruise/bleed easily.  Psychiatric/Behavioral:  The patient is not nervous/anxious.    Blood pressure (!) 160/62, pulse 66, temperature 98.4 F (36.9 C), temperature source Oral, resp. rate 17, SpO2 99%. Physical Exam Constitutional:      General: She is not in acute distress.    Appearance: She is well-developed. She is not diaphoretic.  HENT:     Head: Normocephalic and atraumatic.  Eyes:     General: No scleral icterus.       Right eye: No discharge.        Left eye: No discharge.     Conjunctiva/sclera: Conjunctivae normal.  Cardiovascular:     Rate and Rhythm: Normal rate and regular rhythm.  Pulmonary:     Effort: Pulmonary effort is normal. No respiratory distress.  Musculoskeletal:     Cervical back: Normal range of motion.     Comments: BLE No traumatic wounds, ecchymosis, or rash  KI in place bilaterally  No ankle effusion  Sens DPN, SPN, TN intact  Motor EHL, ext, flex, evers 5/5  DP 1+, PT 1+, 3+ edema  Skin:    General: Skin is warm and dry.  Neurological:     Mental Status: She is alert.  Psychiatric:        Mood and Affect: Mood normal.        Behavior: Behavior normal.     Assessment/Plan: Bilateral femur fxs -- Plan bilateral IMN today with Dr. Jena Gauss. Please keep NPO.    Freeman Caldron, PA-C Orthopedic Surgery [phone] 07/27/2023, 9:24 AM

## 2023-07-27 NOTE — Anesthesia Preprocedure Evaluation (Addendum)
Anesthesia Evaluation  Patient identified by MRN, date of birth, ID band Patient awake    Reviewed: Allergy & Precautions, NPO status , Patient's Chart, lab work & pertinent test results, reviewed documented beta blocker date and time   History of Anesthesia Complications Negative for: history of anesthetic complications  Airway Mallampati: III  TM Distance: >3 FB Neck ROM: Limited    Dental  (+) Edentulous Upper, Partial Lower   Pulmonary neg sleep apnea, neg COPD, neg recent URI   breath sounds clear to auscultation       Cardiovascular hypertension, (-) CAD, (-) Past MI and (-) CABG  Rhythm:Regular Rate:Normal     Neuro/Psych  Headaches, neg Seizures    GI/Hepatic ,GERD  ,,  Endo/Other  diabetes, Type 2    Renal/GU      Musculoskeletal  (+) Arthritis ,    Abdominal   Peds  Hematology  (+) Blood dyscrasia, anemia   Anesthesia Other Findings   Reproductive/Obstetrics                             Anesthesia Physical Anesthesia Plan  ASA: 2  Anesthesia Plan: General   Post-op Pain Management:    Induction:   PONV Risk Score and Plan: 1 and Ondansetron  Airway Management Planned: Oral ETT  Additional Equipment:   Intra-op Plan:   Post-operative Plan: Extubation in OR  Informed Consent: I have reviewed the patients History and Physical, chart, labs and discussed the procedure including the risks, benefits and alternatives for the proposed anesthesia with the patient or authorized representative who has indicated his/her understanding and acceptance.     Dental advisory given  Plan Discussed with: CRNA  Anesthesia Plan Comments:        Anesthesia Quick Evaluation

## 2023-07-27 NOTE — Op Note (Signed)
Orthopaedic Surgery Operative Note (CSN: [id number] ) Date of Surgery: 07/27/2023  Admit Date: 07/26/2023   Diagnoses: Pre-Op Diagnoses: Bilateral periprosthetic femoral shaft fractures  Post-Op Diagnosis: Same  Procedures: CPT 27506-Retrograde intramedullary nailing of right femur fracture CPT 27506-Retrograde intramedullary nailing of left femur fracture  Surgeons : Primary: Roby Lofts, MD  Assistant: Ulyses Southward, PA-C  Location: OR 3   Anesthesia: General   Antibiotics: Ancef 2g preop   Tourniquet time: None    Estimated Blood Loss: 150 mL  Complications:* No complications entered in OR log *   Specimens:* No specimens in log *   Implants: Implant Name Type Inv. Item Serial No. Manufacturer Lot No. LRB No. Used Action  NAIL IM RETROGR 10X340 - [id number] Nail NAIL IM RETROGR 10X340  [employer] [id number] Right 1 Implanted  SCREW LOCK ADV 5X80 - [id number] Screw SCREW LOCK ADV 5X80  [employer] [id number] Right 1 Implanted  SCREW LOCK ADV 5X55 - [id number] Screw SCREW LOCK ADV 5X55  [employer] [id number] Right 1 Implanted  SCREW LOCK T2 5X40 - [id number] Screw SCREW LOCK T2 5X40  [employer] [id number] Right 1 Implanted  NAIL IM RETROGR 10X340 - [id number] Nail NAIL IM RETROGR 10X340  [employer] [id number] Right 1 Implanted  SCREW LOCK ADV 5X80 - [id number] Screw SCREW LOCK ADV 5X80  [employer] [id number] Right 1 Implanted  SCREW LOCK ADV 5X65 - [id number] Screw SCREW LOCK ADV 5X65  [employer] [id number] Right 1 Implanted  SCREW LOCK ADV 5X70 - [id number] Screw SCREW LOCK ADV 5X70  [employer] [id number] Right 1 Implanted  SCREW LOCK ADV 5X55 - [id number] Screw SCREW LOCK ADV 5X55  [employer]  Right 1 Implanted  SCREW LOCK T2 5X40 - [id number] Screw SCREW LOCK T2 5X40  [employer] [id number] Right 1 Implanted     Indications for Surgery: 89 year old female with a ground-level fall sustaining bilateral  femoral shaft fractures.  Due to the unstable nature of her injuries I recommend proceeding with retrograde intramedullary nailing of bilateral femurs.  Risk and benefits were discussed with the patient and her daughter.  Risks include but not limited to bleeding, infection, malunion, nonunion, hardware failure, hardware irritation, nerve and blood vessel injury, DVT, even the possibility anesthetic complications.  They agreed to proceed with surgery and consent was obtained.  Operative Findings: Retrograde intramedullary nailing of bilateral femoral shaft fractures using Stryker retrograde T2 10 x 340 mm nail placed in each femur.  Procedure: The patient was identified in the preoperative holding area. Consent was confirmed with the patient and their family and all questions were answered. The operative extremity was marked after confirmation with the patient. she was then brought back to the operating room by our anesthesia colleagues.  She was placed under general anesthetic and carefully transferred over to radiolucent flattop table.  Bilateral lower extremities were then prepped and draped in usual sterile fashion.  A timeout was performed to verify the patient, the procedure, and the extremity.  Preoperative antibiotics were dosed.  Use for started out with the right lower extremity.  Triangle was used to flex the hip and knee and fluoroscopic imaging showed the unstable nature of her injury.  A small medial parapatellar incision was then made carried down through skin and subcutaneous tissue.  I entered the knee joint and used a threaded guidewire with fluoroscopy as a guide to direct into the box of the total knee arthroplasty.  I advanced into the distal metaphysis and confirmed positioning.  I then used an awl to enter the medullary canal.  I then passed a ball-tipped guidewire down the center of the canal and used a finger reduction tool to assist with manipulation of the fragments to allow for  reduction and crossing the ball-tipped guidewire through the fracture.  I then seated it into the intertrochanteric region.  I then measured the length and decided to use a 340 mm nail.  I then sequentially reamed from 9 mm to 11.5 mm.  I placed a 10 x 340 mm nail down the center canal attached to the targeting arm.  I placed 3 distal interlocking screws and then proceeded to use perfect circle technique to place 1 proximal interlocking screw from anterior to posterior.  The targeting arm was removed and final fluoroscopic imaging was obtained.  The incisions were copiously irrigated and closed with 0 Vicryl, 2-0 Monocryl and 3-0 Monocryl with Dermabond.  We then turned our attention to the left lower extremity.  The left lower extremity was then flexed over a triangle and fluoroscopic imaging showed the unstable nature of her injury.  Traction was applied and alignment was maintained.  A small medial parapatellar incision was then made and carried down through skin and subcutaneous tissue.  We then directed a threaded guidewire at the appropriate starting point advanced into the distal metaphysis.  I then used an awl to enter the medullary canal.  I then passed a ball-tipped guidewire down the center of the canal and seated it into the proximal metaphysis after crossing the fracture.  I then sequentially reamed from 9 mm to 11.5 mm and placed a 10 x 340 mm nail down the center of the canal.  I then used the targeting arm to place 3 distal interlocking screws distally and then proceeded to place 1 proximal interlocking screw from anterior posterior using perfect circle technique.  Final fluoroscopic imaging was obtained.  The incisions were copiously irrigated.  And a layered closure of 0 Vicryl, 2-0 Monocryl and 3-0 Monocryl with Dermabond was used to close the skin.  Sterile dressings were applied.  The patient was awoke from anesthesia and taken to the PACU in stable condition.  Post Op  Plan/Instructions: Patient be weightbearing as tolerated bilateral lower extremities.  She will receive postoperative Ancef.  She will receive Lovenox for DVT prophylaxis and discharged on an oral DOAC.  Will have her mobilize with physical and Occupational Therapy.  I was present and performed the entire surgery.  Ulyses Southward, PA-C did assist me throughout the case. An assistant was necessary given the difficulty in approach, maintenance of reduction and ability to instrument the fracture.   Truitt Merle, MD [facility]

## 2023-07-27 NOTE — Anesthesia Procedure Notes (Signed)
Procedure Name: Intubation Date/Time: 07/27/2023 11:17 AM  Performed by: Georgianne Fick D, CRNAPre-anesthesia Checklist: Patient identified, Emergency Drugs available, Suction available and Patient being monitored Patient Re-evaluated:Patient Re-evaluated prior to induction Oxygen Delivery Method: Circle System Utilized Preoxygenation: Pre-oxygenation with 100% oxygen Induction Type: IV induction Ventilation: Mask ventilation without difficulty Laryngoscope Size: Mac and 4 Grade View: Grade I Tube type: Oral Tube size: 7.0 mm Number of attempts: 1 Airway Equipment and Method: Stylet and Oral airway Placement Confirmation: ETT inserted through vocal cords under direct vision, positive ETCO2 and breath sounds checked- equal and bilateral Secured at: 20 cm Tube secured with: Tape Dental Injury: Teeth and Oropharynx as per pre-operative assessment  Comments: ETT placed by paramedic student under direct supervision by CRNA and MD. No complications noted.

## 2023-07-28 ENCOUNTER — Encounter (HOSPITAL_COMMUNITY): Payer: Self-pay | Admitting: Student

## 2023-07-28 ENCOUNTER — Inpatient Hospital Stay (HOSPITAL_COMMUNITY): Payer: Medicare Other

## 2023-07-28 DIAGNOSIS — S72401A Unspecified fracture of lower end of right femur, initial encounter for closed fracture: Secondary | ICD-10-CM | POA: Diagnosis not present

## 2023-07-28 LAB — PREPARE RBC (CROSSMATCH)

## 2023-07-28 LAB — CBC
HCT: 19 % — ABNORMAL LOW (ref 36.0–46.0)
Hemoglobin: 6.1 g/dL — CL (ref 12.0–15.0)
MCH: 27.9 pg (ref 26.0–34.0)
MCHC: 32.1 g/dL (ref 30.0–36.0)
MCV: 86.8 fL (ref 80.0–100.0)
Platelets: 186 10*3/uL (ref 150–400)
RBC: 2.19 MIL/uL — ABNORMAL LOW (ref 3.87–5.11)
RDW: 16 % — ABNORMAL HIGH (ref 11.5–15.5)
WBC: 9.8 10*3/uL (ref 4.0–10.5)
nRBC: 0 % (ref 0.0–0.2)

## 2023-07-28 LAB — GLUCOSE, CAPILLARY
Glucose-Capillary: 140 mg/dL — ABNORMAL HIGH (ref 70–99)
Glucose-Capillary: 163 mg/dL — ABNORMAL HIGH (ref 70–99)
Glucose-Capillary: 144 mg/dL — ABNORMAL HIGH (ref 70–99)

## 2023-07-28 LAB — BASIC METABOLIC PANEL
Anion gap: 7 (ref 5–15)
BUN: 15 mg/dL (ref 8–23)
CO2: 21 mmol/L — ABNORMAL LOW (ref 22–32)
Calcium: 7.7 mg/dL — ABNORMAL LOW (ref 8.9–10.3)
Chloride: 110 mmol/L (ref 98–111)
Creatinine, Ser: 0.59 mg/dL (ref 0.44–1.00)
GFR, Estimated: >60 mL/min (ref >60)
Glucose, Bld: 134 mg/dL — ABNORMAL HIGH (ref 70–99)
Potassium: 4.1 mmol/L (ref 3.5–5.1)
Sodium: 138 mmol/L (ref 135–145)

## 2023-07-28 LAB — VITAMIN D 25 HYDROXY (VIT D DEFICIENCY, FRACTURES): Vit D, 25-Hydroxy: 35.65 ng/mL (ref 30–100)

## 2023-07-28 MED ORDER — POLYETHYLENE GLYCOL 3350 17 G PO PACK
17.0000 g | PACK | Freq: Two times a day (BID) | ORAL | Status: DC
  Administered 2023-07-28 – 2023-07-31 (×6): 17 g via ORAL
  Filled 2023-07-28 (×7): qty 1

## 2023-07-28 MED ORDER — SODIUM CHLORIDE 0.9% IV SOLUTION: Freq: Once | INTRAVENOUS | Status: AC

## 2023-07-28 MED ORDER — APIXABAN 2.5 MG PO TABS: 2.5000 mg | ORAL_TABLET | Freq: Two times a day (BID) | ORAL | 0 refills | Status: AC

## 2023-07-28 MED ORDER — FUROSEMIDE 10 MG/ML IJ SOLN
20.0000 mg | Freq: Once | INTRAMUSCULAR | Status: AC
  Administered 2023-07-28: 20 mg via INTRAVENOUS
  Filled 2023-07-28: qty 2

## 2023-07-28 MED ORDER — OXYCODONE HCL 5 MG PO TABS: 5.0000 mg | ORAL_TABLET | ORAL | 0 refills | Status: AC | PRN

## 2023-07-28 MED ORDER — SENNA 8.6 MG PO TABS
1.0000 | ORAL_TABLET | Freq: Every day | ORAL | Status: DC
  Administered 2023-07-28 – 2023-07-31 (×4): 8.6 mg via ORAL
  Filled 2023-07-28 (×5): qty 1

## 2023-07-28 MED ORDER — METHOCARBAMOL 500 MG PO TABS: 500.0000 mg | ORAL_TABLET | Freq: Four times a day (QID) | ORAL | 0 refills | Status: AC | PRN

## 2023-07-28 NOTE — Plan of Care (Signed)
  Problem: Pain Managment: Goal: General experience of comfort will improve Outcome: Progressing   Problem: Safety: Goal: Ability to remain free from injury will improve Outcome: Progressing   

## 2023-07-28 NOTE — Anesthesia Postprocedure Evaluation (Signed)
Anesthesia Post Note  Patient: VANDORA HICKEY  Procedure(s) Performed: INTRAMEDULLARY (IM) RETROGRADE FEMORAL NAILING (Bilateral: Leg Upper)     Patient location during evaluation: PACU Anesthesia Type: General Level of consciousness: awake and alert Pain management: pain level controlled Vital Signs Assessment: post-procedure vital signs reviewed and stable Respiratory status: spontaneous breathing, nonlabored ventilation, respiratory function stable and patient connected to nasal cannula oxygen Cardiovascular status: blood pressure returned to baseline and stable Postop Assessment: no apparent nausea or vomiting Anesthetic complications: no   No notable events documented.  Last Vitals:  Vitals:   07/28/23 0431 07/28/23 0750  BP: (!) 107/58 (!) 130/52  Pulse: 77 82  Resp: 15 16  Temp: 37.6 C 36.9 C  SpO2: 98% 98%    Last Pain:  Vitals:   07/28/23 0750  TempSrc: Oral  PainSc:                  Mariann Barter

## 2023-07-28 NOTE — Progress Notes (Signed)
OT Cancellation Note  Patient Details Name: [name] MRN: [id number] DOB: 13-Sep-1932   Cancelled Treatment:    Reason Eval/Treat Not Completed: Patient not medically ready ((Pt hgb 6.1 after surgery. MD notified PT/OT while heading into pt room, asked to hold therapies at this time until pt can receive blood. Will continue to follow up as able and appropriate.))  07/28/2023  AB, OTR/L  Acute Rehabilitation Services  Office: [phone]   Tristan Schroeder 07/28/2023, 1:58 PM

## 2023-07-28 NOTE — Progress Notes (Addendum)
PROGRESS NOTE    [name]  [name]:[id number] DOB: [birth year] DOA: 07/26/2023 PCP: Madison Limerick, MD   Brief Narrative: 89 year old with past medical history significant for GERD, hypertension, diabetes type 2 presented to the ED after mechanical fall, and fever evaluation x-ray left and right femur demonstrated bilateral femur fracture.   Assessment & Plan:   Principal Problem:   Femur fracture (HCC)  1-Mechanical fall, bilateral femur fracture -Underwent surgery on 9/5: Retrograde intramedullary nailing of right femur fracture, Retrograde intramedullary nailing of left femur fracture Pain management per ortho.  DVT prophylaxis per ortho Bowel regimen,. Start Miralax, senna.   2-Acute Blood loss anemia: post sx, expected.  Plan to transfuse 2 units PRBC.  Repeat Hb in am.   Left Shoulder pain: plan to check X ray  Hypertension: Continue with amlodipine.  Agree with holding ARB  GERD: Continue with PPI  Glaucoma: Continue with eyedrops  Diabetes type 2: Check CBG and sliding scale insulin   Pressure Injury 06/13/23 Buttocks Right;Left Stage 2 -  Partial thickness loss of dermis presenting as a shallow open injury with a red, pink wound bed without slough. healing pressure injury. no drainage noted. pt states she sees wound care regularly. (Active)  06/13/23   Location: Buttocks  Location Orientation: Right;Left  Staging: Stage 2 -  Partial thickness loss of dermis presenting as a shallow open injury with a red, pink wound bed without slough.  Wound Description (Comments): healing pressure injury. no drainage noted. pt states she sees wound care regularly.  Present on Admission: Yes       Estimated body mass index is 28.87 kg/m as calculated from the following:   Height as of 06/28/23: 5\' 3"  (1.6 m).   Weight as of 06/28/23: 73.9 kg.   DVT prophylaxis: SCD Code Status: Full code Family Communication: Daughter at bedside Disposition Plan:  Status is:  Inpatient Remains inpatient appropriate because: femur fracture    Consultants:  ortho  Procedures:    Antimicrobials:    Subjective: She is alert, feel weak, dizzy. She has not had a bowel movement.   Objective: Vitals:   07/28/23 0431 07/28/23 0750 07/28/23 1231 07/28/23 1310  BP: (!) 107/58 (!) 130/52 (!) 106/54 (!) 115/43  Pulse: 77 82 (!) 101 99  Resp: 15 16 16 14   Temp: 99.7 F (37.6 C) 98.5 F (36.9 C) 98 F (36.7 C) 99 F (37.2 C)  TempSrc: Oral Oral Oral Oral  SpO2: 98% 98% 100% 100%    Intake/Output Summary (Last 24 hours) at 07/28/2023 1530 Last data filed at 07/28/2023 0600 Gross per 24 hour  Intake 2402.05 ml  Output 700 ml  Net 1702.05 ml   There were no vitals filed for this visit.  Examination:  General exam: NAD Respiratory system: CTA. Cardiovascular system: S 1, S 2 RRR Gastrointestinal system: BS present, soft nt Central nervous system: Alert Extremities: BL LE dressing BL knee     Data Reviewed: I have personally reviewed following labs and imaging studies  CBC: Recent Labs  Lab 07/26/23 1929 07/27/23 1622 07/28/23 0807  WBC 9.8 13.9* 9.8  NEUTROABS 7.8*  --   --   HGB 9.3* 7.8* 6.1*  HCT 29.3* 24.7* 19.0*  MCV 83.7 87.3 86.8  PLT 310 223 186   Basic Metabolic Panel: Recent Labs  Lab 07/26/23 1929 07/27/23 1622 07/28/23 0807  NA 135  --  138  K 4.1  --  4.1  CL 104  --  110  CO2 18*  --  21*  GLUCOSE 160*  --  134*  BUN 20  --  15  CREATININE 0.67 0.58 0.59  CALCIUM 8.9  --  7.7*   GFR: CrCl cannot be calculated (Unknown ideal weight.). Liver Function Tests: Recent Labs  Lab 07/26/23 1929  AST 15  ALT 13  ALKPHOS 73  BILITOT 0.6  PROT 7.3  ALBUMIN 3.2*   No results for input(s): "LIPASE", "AMYLASE" in the last 168 hours. No results for input(s): "AMMONIA" in the last 168 hours. Coagulation Profile: Recent Labs  Lab 07/26/23 1929  INR 1.1   Cardiac Enzymes: No results for input(s): "CKTOTAL",  "CKMB", "CKMBINDEX", "TROPONINI" in the last 168 hours. BNP (last 3 results) No results for input(s): "PROBNP" in the last 8760 hours. HbA1C: No results for input(s): "HGBA1C" in the last 72 hours. CBG: Recent Labs  Lab 07/27/23 1323 07/27/23 1722 07/27/23 2209 07/28/23 0809 07/28/23 1142  GLUCAP 122* 122* 185* 140* 163*   Lipid Profile: No results for input(s): "CHOL", "HDL", "LDLCALC", "TRIG", "CHOLHDL", "LDLDIRECT" in the last 72 hours. Thyroid Function Tests: No results for input(s): "TSH", "T4TOTAL", "FREET4", "T3FREE", "THYROIDAB" in the last 72 hours. Anemia Panel: No results for input(s): "VITAMINB12", "FOLATE", "FERRITIN", "TIBC", "IRON", "RETICCTPCT" in the last 72 hours. Sepsis Labs: No results for input(s): "PROCALCITON", "LATICACIDVEN" in the last 168 hours.  No results found for this or any previous visit (from the past 240 hour(s)).       Radiology Studies: DG FEMUR MIN 2 VIEWS LEFT  Result Date: 07/27/2023 CLINICAL DATA:  Elective surgery. EXAM: LEFT FEMUR 2 VIEWS COMPARISON:  Preoperative imaging. FINDINGS: Ten fluoroscopic spot views of the femur obtained in the operating room. Sequential images during femoral intramedullary nail with proximal and distal locking screw fixation traversing distal femur fracture. Fluoroscopy time 90.45 seconds. Dose 9.57 mGy. IMPRESSION: Intraoperative fluoroscopy during femoral fracture fixation. Electronically Signed   By: Narda Rutherford M.D.   On: 07/27/2023 15:33   DG FEMUR, MIN 2 VIEWS RIGHT  Result Date: 07/27/2023 CLINICAL DATA:  Elective surgery. EXAM: RIGHT FEMUR 2 VIEWS COMPARISON:  Preoperative imaging. FINDINGS: Four fluoroscopic spot views of the femur obtained in the operating room. Femoral intramedullary nail with proximal and distal locking screw fixation. Fluoroscopy time 1:30 2.55 seconds. Dose 9.57 mGy. IMPRESSION: Intraoperative fluoroscopy during femoral fracture fixation. Electronically Signed   By: Narda Rutherford M.D.   On: 07/27/2023 15:32   DG FEMUR PORT MIN 2 VIEWS LEFT  Result Date: 07/27/2023 CLINICAL DATA:  Femur fracture, postop. EXAM: LEFT FEMUR PORTABLE 2 VIEWS COMPARISON:  Preoperative imaging. FINDINGS: Femoral intramedullary nail with proximal and distal locking screw fixation traverse distal femoral shaft fracture. Improved fracture alignment from preoperative imaging. Prior knee replacement. Recent postsurgical change includes air and edema in the soft tissues. IMPRESSION: ORIF of distal femoral shaft fracture. No immediate postoperative complication. Electronically Signed   By: Narda Rutherford M.D.   On: 07/27/2023 15:31   DG FEMUR PORT, MIN 2 VIEWS RIGHT  Result Date: 07/27/2023 CLINICAL DATA:  Femur fracture, postop IM nail. EXAM: RIGHT FEMUR PORTABLE 2 VIEW COMPARISON:  Preoperative radiograph FINDINGS: Femoral intramedullary nail with proximal and distal locking screw fixation traverses femoral shaft fracture. Improved fracture alignment from preoperative imaging. There is been prior knee arthroplasty. Recent postsurgical change includes air and edema in the soft tissues. IMPRESSION: ORIF of femoral shaft fracture. No immediate postoperative complication. Electronically Signed   By: Narda Rutherford M.D.   On: 07/27/2023  15:30   DG C-Arm 1-60 Min-No Report  Result Date: 07/27/2023 Fluoroscopy was utilized by the requesting physician.  No radiographic interpretation.   DG C-Arm 1-60 Min-No Report  Result Date: 07/27/2023 Fluoroscopy was utilized by the requesting physician.  No radiographic interpretation.   CT HEAD WO CONTRAST  Result Date: 07/26/2023 CLINICAL DATA:  Polytrauma, blunt.  Patient fell. EXAM: CT HEAD WITHOUT CONTRAST CT CERVICAL SPINE WITHOUT CONTRAST TECHNIQUE: Multidetector CT imaging of the head and cervical spine was performed following the standard protocol without intravenous contrast. Multiplanar CT image reconstructions of the cervical spine were also  generated. RADIATION DOSE REDUCTION: This exam was performed according to the departmental dose-optimization program which includes automated exposure control, adjustment of the mA and/or kV according to patient size and/or use of iterative reconstruction technique. COMPARISON:  CT scan head from 06/12/2023 and CT scan cervical spine from 12/27/2010. FINDINGS: CT HEAD FINDINGS Brain: No evidence of acute infarction, hemorrhage, hydrocephalus, extra-axial collection or mass lesion/mass effect. Vascular: No hyperdense vessel or unexpected calcification. Skull: Normal. Negative for fracture or focal lesion. Sinuses/Orbits: No acute finding. Other: None. CT CERVICAL SPINE FINDINGS Alignment: Normal. This examination does not assess for ligamentous injury or stability. Note is made of C3-4 ACDF. Skull base and vertebrae: No acute fracture. No primary bone lesion or focal pathologic process. Soft tissues and spinal canal: No prevertebral fluid or swelling. No visible canal hematoma. Disc levels: There are moderate multilevel degenerative changes of the cervical spine characterized by reduced intervertebral disc height, facet arthropathy and marginal osteophyte formation, most pronounced at C4-5 level. Upper chest: Negative. Other: None. IMPRESSION: 1. No acute intracranial abnormality. 2. No acute fracture or traumatic malalignment of the cervical spine. 3. Moderate multilevel degenerative changes of the cervical spine. Electronically Signed   By: Jules Schick M.D.   On: 07/26/2023 20:43   CT CERVICAL SPINE WO CONTRAST  Result Date: 07/26/2023 CLINICAL DATA:  Polytrauma, blunt.  Patient fell. EXAM: CT HEAD WITHOUT CONTRAST CT CERVICAL SPINE WITHOUT CONTRAST TECHNIQUE: Multidetector CT imaging of the head and cervical spine was performed following the standard protocol without intravenous contrast. Multiplanar CT image reconstructions of the cervical spine were also generated. RADIATION DOSE REDUCTION: This exam was  performed according to the departmental dose-optimization program which includes automated exposure control, adjustment of the mA and/or kV according to patient size and/or use of iterative reconstruction technique. COMPARISON:  CT scan head from 06/12/2023 and CT scan cervical spine from 12/27/2010. FINDINGS: CT HEAD FINDINGS Brain: No evidence of acute infarction, hemorrhage, hydrocephalus, extra-axial collection or mass lesion/mass effect. Vascular: No hyperdense vessel or unexpected calcification. Skull: Normal. Negative for fracture or focal lesion. Sinuses/Orbits: No acute finding. Other: None. CT CERVICAL SPINE FINDINGS Alignment: Normal. This examination does not assess for ligamentous injury or stability. Note is made of C3-4 ACDF. Skull base and vertebrae: No acute fracture. No primary bone lesion or focal pathologic process. Soft tissues and spinal canal: No prevertebral fluid or swelling. No visible canal hematoma. Disc levels: There are moderate multilevel degenerative changes of the cervical spine characterized by reduced intervertebral disc height, facet arthropathy and marginal osteophyte formation, most pronounced at C4-5 level. Upper chest: Negative. Other: None. IMPRESSION: 1. No acute intracranial abnormality. 2. No acute fracture or traumatic malalignment of the cervical spine. 3. Moderate multilevel degenerative changes of the cervical spine. Electronically Signed   By: Jules Schick M.D.   On: 07/26/2023 20:43   DG FEMUR MIN 2 VIEWS LEFT  Result Date: 07/26/2023  CLINICAL DATA:  Fall. EXAM: RIGHT FEMUR 2 VIEWS; LEFT FEMUR 2 VIEWS COMPARISON:  None Available. FINDINGS: Examination is limited due to patient related factors. There is comminuted fracture of the distal half of the right femur. There is approximately 1 shaft width displacement. There is also angulated and comminuted fracture of the distal half of the left femur which probably reaches up to the knee transplant femoral prosthesis.  Bilateral knee arthroplasty noted. No aggressive osseous lesion. No focal soft tissue swelling. No radiopaque foreign bodies. IMPRESSION: Bilateral femur fractures. Electronically Signed   By: Jules Schick M.D.   On: 07/26/2023 20:26   DG FEMUR, MIN 2 VIEWS RIGHT  Result Date: 07/26/2023 CLINICAL DATA:  Fall. EXAM: RIGHT FEMUR 2 VIEWS; LEFT FEMUR 2 VIEWS COMPARISON:  None Available. FINDINGS: Examination is limited due to patient related factors. There is comminuted fracture of the distal half of the right femur. There is approximately 1 shaft width displacement. There is also angulated and comminuted fracture of the distal half of the left femur which probably reaches up to the knee transplant femoral prosthesis. Bilateral knee arthroplasty noted. No aggressive osseous lesion. No focal soft tissue swelling. No radiopaque foreign bodies. IMPRESSION: Bilateral femur fractures. Electronically Signed   By: Jules Schick M.D.   On: 07/26/2023 20:26   DG Pelvis 1-2 Views  Result Date: 07/26/2023 CLINICAL DATA:  [id number] Fall [id number] EXAM: PELVIS - 1-2 VIEW COMPARISON:  None Available. FINDINGS: Examination is markedly limited due to patient related factors. No acute displaced fracture seen. There are changes of chronic pubic symphisitis. There are moderate to severe degenerative changes of bilateral hip joints in the form of reduced joint space, subchondral sclerosis / cystic changes and osteophytosis. No radiopaque foreign bodies. IMPRESSION: *Markedly limited exam. No acute displaced fracture. If there is high clinical concern for fracture, cross-sectional imaging is recommended. Electronically Signed   By: Jules Schick M.D.   On: 07/26/2023 20:23   DG Chest 1 View  Result Date: 07/26/2023 CLINICAL DATA:  fall. EXAM: CHEST  1 VIEW COMPARISON:  11/28/2022. FINDINGS: Bilateral lung fields are clear. Bilateral lateral costophrenic angles are clear. Stable cardio-mediastinal silhouette. Aortic arch  calcifications noted. No acute osseous abnormalities. The soft tissues are within normal limits. IMPRESSION: *No acute cardiopulmonary process. Electronically Signed   By: Jules Schick M.D.   On: 07/26/2023 20:20        Scheduled Meds:  amLODipine  10 mg Oral Daily   docusate sodium  100 mg Oral BID   enoxaparin (LOVENOX) injection  40 mg Subcutaneous Q24H   glipiZIDE  2.5 mg Oral Q breakfast   latanoprost  1 drop Both Eyes QHS   pantoprazole  40 mg Oral Daily   Continuous Infusions:  sodium chloride 125 mL/hr at 07/28/23 1144    ceFAZolin (ANCEF) IV 2 g (07/28/23 0511)   methocarbamol (ROBAXIN) IV       LOS: 2 days    Time spent: 35 minutes    Neesha Langton A Pattye Meda, MD [facility]   If 7PM-7AM, please contact night-coverage [email or web address]  07/28/2023, 3:30 PM

## 2023-07-28 NOTE — Discharge Instructions (Signed)
[facility] Discharge Instructions   General Discharge Instructions  WEIGHT BEARING STATUS: Weightbearing as tolerated bilateral lower extremities  RANGE OF MOTION/ACTIVITY: Unrestricted range of motion  Wound Care: You may remove your surgical dressing on postoperative day #2 (07/29/2023). Incisions can be left open to air if there is no drainage. Once the incision is completely dry and without drainage, it may be left open to air out.  Showering may begin postoperative day #3 (07/30/2023).  Clean incision gently with soap and water.  DVT/PE prophylaxis:  Eliquis 2.5 mg twice daily x 30 days  Diet: as you were eating previously.  Can use over the counter stool softeners and bowel preparations, such as Miralax, to help with bowel movements.  Narcotics can be constipating.  Be sure to drink plenty of fluids  PAIN MEDICATION USE AND EXPECTATIONS  You have likely been given narcotic medications to help control your pain.  After a traumatic event that results in an fracture (broken bone) with or without surgery, it is ok to use narcotic pain medications to help control one's pain.  We understand that everyone responds to pain differently and each individual patient will be evaluated on a regular basis for the continued need for narcotic medications. Ideally, narcotic medication use should last no more than 6-8 weeks (coinciding with fracture healing).   As a patient it is your responsibility as well to monitor narcotic medication use and report the amount and frequency you use these medications when you come to your office visit.   We would also advise that if you are using narcotic medications, you should take a dose prior to therapy to maximize you participation.  IF YOU ARE ON NARCOTIC MEDICATIONS IT IS NOT PERMISSIBLE TO OPERATE A MOTOR VEHICLE (MOTORCYCLE/CAR/TRUCK/MOPED) OR HEAVY MACHINERY DO NOT MIX NARCOTICS WITH OTHER CNS (CENTRAL NERVOUS SYSTEM) DEPRESSANTS SUCH AS  ALCOHOL   STOP SMOKING OR USING NICOTINE PRODUCTS!!!!  As discussed nicotine severely impairs your body's ability to heal surgical and traumatic wounds but also impairs bone healing.  Wounds and bone heal by forming microscopic blood vessels (angiogenesis) and nicotine is a vasoconstrictor (essentially, shrinks blood vessels).  Therefore, if vasoconstriction occurs to these microscopic blood vessels they essentially disappear and are unable to deliver necessary nutrients to the healing tissue.  This is one modifiable factor that you can do to dramatically increase your chances of healing your injury.    (This means no smoking, no nicotine gum, patches, etc)  DO NOT USE NONSTEROIDAL ANTI-INFLAMMATORY DRUGS (NSAID'S)  Using products such as Advil (ibuprofen), Aleve (naproxen), Motrin (ibuprofen) for additional pain control during fracture healing can delay and/or prevent the healing response.  If you would like to take over the counter (OTC) medication, Tylenol (acetaminophen) is ok.  However, some narcotic medications that are given for pain control contain acetaminophen as well. Therefore, you should not exceed more than 4000 mg of tylenol in a day if you do not have liver disease.  Also note that there are may OTC medicines, such as cold medicines and allergy medicines that my contain tylenol as well.  If you have any questions about medications and/or interactions please ask your doctor/PA or your pharmacist.      ICE AND ELEVATE INJURED/OPERATIVE EXTREMITY  Using ice and elevating the injured extremity above your heart can help with swelling and pain control.  Icing in a pulsatile fashion, such as 20 minutes on and 20 minutes off, can be followed.    Do not  place ice directly on skin. Make sure there is a barrier between to skin and the ice pack.    Using frozen items such as frozen peas works well as the conform nicely to the are that needs to be iced.  USE AN ACE WRAP OR TED HOSE FOR SWELLING  CONTROL  In addition to icing and elevation, Ace wraps or TED hose are used to help limit and resolve swelling.  It is recommended to use Ace wraps or TED hose until you are informed to stop.    When using Ace Wraps start the wrapping distally (farthest away from the body) and wrap proximally (closer to the body)   Example: If you had surgery on your leg or thing and you do not have a splint on, start the ace wrap at the toes and work your way up to the thigh        If you had surgery on your upper extremity and do not have a splint on, start the ace wrap at your fingers and work your way up to the upper arm   CALL THE OFFICE WITH ANY QUESTIONS OR CONCERNS: [phone]   VISIT OUR WEBSITE FOR ADDITIONAL INFORMATION: [email or web address]  Discharge Wound Care Instructions  Do NOT apply any ointments, solutions or lotions to pin sites or surgical wounds.  These prevent needed drainage and even though solutions like hydrogen peroxide kill bacteria, they also damage cells lining the pin sites that help fight infection.  Applying lotions or ointments can keep the wounds moist and can cause them to breakdown and open up as well. This can increase the risk for infection. When in doubt call the office.  Surgical incisions should be dressed daily.  If any drainage is noted, use one layer of adaptic or Mepitel, then gauze, Kerlix, and an ace wrap. - These dressing supplies should be available at local medical supply stores [facility], [facility], etc) as well as Insurance claims handler ([facility], [facility], [employer], etc)  Once the incision is completely dry and without drainage, it may be left open to air out.  Showering may begin 36-48 hours later.  Cleaning gently with soap and water.  Traumatic wounds should be dressed daily as well.    One layer of adaptic, gauze, Kerlix, then ace wrap.  The adaptic can be discontinued once the draining has ceased    If you have a wet to dry dressing: wet the gauze  with saline the squeeze as much saline out so the gauze is moist (not soaking wet), place moistened gauze over wound, then place a dry gauze over the moist one, followed by Kerlix wrap, then ace wrap.

## 2023-07-28 NOTE — Care Management Important Message (Signed)
Important Message  Patient Details  Name: [name] MRN: [id number] Date of Birth: Feb 07, 1932   Medicare Important Message Given:  Yes     Sherilyn Banker 07/28/2023, 2:58 PM

## 2023-07-28 NOTE — Plan of Care (Signed)
progressing 

## 2023-07-28 NOTE — Progress Notes (Signed)
Orthopaedic Trauma Progress Note  SUBJECTIVE: Doing well this morning.  Pain controlled.  Has not been up out of bed yet since surgery.  Denies any numbness tingling to bilateral lower extremities.  No chest pain. No SOB. No nausea/vomiting. No other complaints.  Tolerating diet and fluids.  No BM since admission.  OBJECTIVE:  Vitals:   07/28/23 0431 07/28/23 0750  BP: (!) 107/58 (!) 130/52  Pulse: 77 82  Resp: 15 16  Temp: 99.7 F (37.6 C) 98.5 F (36.9 C)  SpO2: 98% 98%    General: Sitting up in bed, no acute distress Respiratory: No increased work of breathing.  Bilateral lower extremity: Dressings clean, dry, intact.  Tenderness throughout the thighs as expected.  Ankle DF/PF intact bilaterally.  Able to wiggle toes.  Endorses sensation to light touch throughout bilateral feet.+ DP pulse, equal bilaterally  IMAGING: Stable post op imaging.   LABS:  Results for orders placed or performed during the hospital encounter of 07/26/23 (from the past 24 hour(s))  Glucose, capillary     Status: Abnormal   Collection Time: 07/27/23  1:23 PM  Result Value Ref Range   Glucose-Capillary 122 (H) 70 - 99 mg/dL  CBC     Status: Abnormal   Collection Time: 07/27/23  4:22 PM  Result Value Ref Range   WBC 13.9 (H) 4.0 - 10.5 K/uL   RBC 2.83 (L) 3.87 - 5.11 MIL/uL   Hemoglobin 7.8 (L) 12.0 - 15.0 g/dL   HCT 74.2 (L) 59.5 - 63.8 %   MCV 87.3 80.0 - 100.0 fL   MCH 27.6 26.0 - 34.0 pg   MCHC 31.6 30.0 - 36.0 g/dL   RDW 75.6 (H) 43.3 - 29.5 %   Platelets 223 150 - 400 K/uL   nRBC 0.0 0.0 - 0.2 %  Creatinine, serum     Status: None   Collection Time: 07/27/23  4:22 PM  Result Value Ref Range   Creatinine, Ser 0.58 0.44 - 1.00 mg/dL   GFR, Estimated >18 >84 mL/min  Glucose, capillary     Status: Abnormal   Collection Time: 07/27/23  5:22 PM  Result Value Ref Range   Glucose-Capillary 122 (H) 70 - 99 mg/dL  Glucose, capillary     Status: Abnormal   Collection Time: 07/27/23 10:09 PM   Result Value Ref Range   Glucose-Capillary 185 (H) 70 - 99 mg/dL  VITAMIN D 25 Hydroxy (Vit-D Deficiency, Fractures)     Status: None   Collection Time: 07/28/23  8:07 AM  Result Value Ref Range   Vit D, 25-Hydroxy 35.65 30 - 100 ng/mL  CBC     Status: Abnormal   Collection Time: 07/28/23  8:07 AM  Result Value Ref Range   WBC 9.8 4.0 - 10.5 K/uL   RBC 2.19 (L) 3.87 - 5.11 MIL/uL   Hemoglobin 6.1 (LL) 12.0 - 15.0 g/dL   HCT 16.6 (L) 06.3 - 01.6 %   MCV 86.8 80.0 - 100.0 fL   MCH 27.9 26.0 - 34.0 pg   MCHC 32.1 30.0 - 36.0 g/dL   RDW 01.0 (H) 93.2 - 35.5 %   Platelets 186 150 - 400 K/uL   nRBC 0.0 0.0 - 0.2 %  Basic metabolic panel     Status: Abnormal   Collection Time: 07/28/23  8:07 AM  Result Value Ref Range   Sodium 138 135 - 145 mmol/L   Potassium 4.1 3.5 - 5.1 mmol/L   Chloride 110 98 - 111 mmol/L  CO2 21 (L) 22 - 32 mmol/L   Glucose, Bld 134 (H) 70 - 99 mg/dL   BUN 15 8 - 23 mg/dL   Creatinine, Ser 6.29 0.44 - 1.00 mg/dL   Calcium 7.7 (L) 8.9 - 10.3 mg/dL   GFR, Estimated >52 >84 mL/min   Anion gap 7 5 - 15  Glucose, capillary     Status: Abnormal   Collection Time: 07/28/23  8:09 AM  Result Value Ref Range   Glucose-Capillary 140 (H) 70 - 99 mg/dL   Comment 1 Notify RN   Type and screen     Status: None (Preliminary result)   Collection Time: 07/28/23  8:49 AM  Result Value Ref Range   ABO/RH(D) O POS    Antibody Screen NEG    Sample Expiration      07/31/2023,2359 Performed at [facility], [address], [place], [place] [address]    Unit Number [id number]    Blood Component Type RED CELLS,LR    Unit division 00    Status of Unit ALLOCATED    Transfusion Status OK TO TRANSFUSE    Crossmatch Result Compatible    Unit Number [id number]    Blood Component Type RED CELLS,LR    Unit division 00    Status of Unit ALLOCATED    Transfusion Status OK TO TRANSFUSE    Crossmatch Result Compatible   Prepare RBC (crossmatch)     Status: None    Collection Time: 07/28/23 11:30 AM  Result Value Ref Range   Order Confirmation      ORDER PROCESSED BY BLOOD BANK Performed at [facility], [address], [place], [place] [address]   Glucose, capillary     Status: Abnormal   Collection Time: 07/28/23 11:42 AM  Result Value Ref Range   Glucose-Capillary 163 (H) 70 - 99 mg/dL   Comment 1 Notify RN     ASSESSMENT: [name] is a 89 y.o. female, 1 Day Post-Op s/p RETROGRADE INTRAMEDULLARY NAILING BILATERAL FEMUR FRACTURES  CV/Blood loss: Acute blood loss anemia, Hgb 6.1 this morning.  1 unit PRBCs ordered per primary team. Hemodynamically stable  PLAN: Weightbearing: WBAT RLE and LLE ROM: Okay for unrestricted ROM Incisional and dressing care: Reinforce dressings as needed  Showering: Okay to begin showering & getting incisions wet 07/29/2023 Orthopedic device(s): None  Pain management: Continue current multimodal regimen. Limit narcotics as able VTE prophylaxis: Lovenox, SCDs ID:  Ancef 2gm post op Foley/Lines:  No foley, KVO IVFs Impediments to Fracture Healing: Vit d level 35, no supplementation needed Dispo: PT/OT evaluation today, dispo pending.  Patient will likely require SNF.  Continue to monitor CBC.  I have signed and placed discharge Rx for pain medication, muscle relaxer, DVT prophylaxis in patient's chart  D/C recommendations: -Oxycodone and Robaxin for pain control -Eliquis 2.5 mg p.o. twice daily x 30 days for DVT prophylaxis - No additional need for Vit D supplementation  Follow - up plan: 2 weeks after d/c for wound check and repeat x-ray   Contact information:  Truitt Merle MD, Thyra Breed PA-C. After hours and holidays please check [email or web address] for group call information for [facility]   Thompson Caul, PA-C [phone] (office) [email or web address]

## 2023-07-28 NOTE — Progress Notes (Signed)
PT Cancellation Note  Patient Details Name: [name] MRN: [id number] DOB: 1932/01/05   Cancelled Treatment:    Reason Eval/Treat Not Completed: Patient not medically ready (Pt hgb 6.1 after surgery. MD notified PT/OT while heading into pt room. Asked asked to hold therapies at this time until pt can receive blood. Will continue to follow up as able and appropriate.)  Harrel Carina, DPT, CLT  Acute Rehabilitation Services Office: [phone] (Secure chat preferred)   Claudia Desanctis 07/28/2023, 12:43 PM

## 2023-07-28 NOTE — TOC CAGE-AID Note (Signed)
Transition of Care [facility]) - CAGE-AID Screening   Patient Details  Name: [name] MRN: [id number] Date of Birth: November 19, 1932  Transition of [facility]) CM/SW Contact:    Katha Hamming, RN Phone Number: 07/28/2023, 6:30 AM   Clinical Narrative: Chart review, no hx of alcohol/drug abuse, no resources indicated   CAGE-AID Screening:    Have You Ever Felt You Ought to Cut Down on Your Drinking or Drug Use?: No Have People Annoyed You By Critizing Your Drinking Or Drug Use?: No Have You Felt Bad Or Guilty About Your Drinking Or Drug Use?: No Have You Ever Had a Drink or Used Drugs First Thing In The Morning to Steady Your Nerves or to Get Rid of a Hangover?: No CAGE-AID Score: 0  Substance Abuse Education Offered: No

## 2023-07-29 DIAGNOSIS — S72401A Unspecified fracture of lower end of right femur, initial encounter for closed fracture: Secondary | ICD-10-CM | POA: Diagnosis not present

## 2023-07-29 LAB — CBC WITH DIFFERENTIAL/PLATELET
Abs Immature Granulocytes: 0.05 10*3/uL (ref 0.00–0.07)
Basophils Absolute: 0 10*3/uL (ref 0.0–0.1)
Basophils Relative: 0 %
Eosinophils Absolute: 0.5 10*3/uL (ref 0.0–0.5)
Eosinophils Relative: 5 %
HCT: 25.2 % — ABNORMAL LOW (ref 36.0–46.0)
Hemoglobin: 8.2 g/dL — ABNORMAL LOW (ref 12.0–15.0)
Immature Granulocytes: 1 %
Lymphocytes Relative: 21 %
Lymphs Abs: 2.3 10*3/uL (ref 0.7–4.0)
MCH: 28 pg (ref 26.0–34.0)
MCHC: 32.5 g/dL (ref 30.0–36.0)
MCV: 86 fL (ref 80.0–100.0)
Monocytes Absolute: 1 10*3/uL (ref 0.1–1.0)
Monocytes Relative: 9 %
Neutro Abs: 7 10*3/uL (ref 1.7–7.7)
Neutrophils Relative %: 64 %
Platelets: 177 10*3/uL (ref 150–400)
RBC: 2.93 MIL/uL — ABNORMAL LOW (ref 3.87–5.11)
RDW: 15.4 % (ref 11.5–15.5)
WBC: 10.9 10*3/uL — ABNORMAL HIGH (ref 4.0–10.5)
nRBC: 0 % (ref 0.0–0.2)

## 2023-07-29 LAB — BASIC METABOLIC PANEL
Anion gap: 7 (ref 5–15)
BUN: 10 mg/dL (ref 8–23)
CO2: 20 mmol/L — ABNORMAL LOW (ref 22–32)
Calcium: 7.8 mg/dL — ABNORMAL LOW (ref 8.9–10.3)
Chloride: 106 mmol/L (ref 98–111)
Creatinine, Ser: 0.5 mg/dL (ref 0.44–1.00)
GFR, Estimated: >60 mL/min (ref >60)
Glucose, Bld: 117 mg/dL — ABNORMAL HIGH (ref 70–99)
Potassium: 3.7 mmol/L (ref 3.5–5.1)
Sodium: 133 mmol/L — ABNORMAL LOW (ref 135–145)

## 2023-07-29 LAB — TYPE AND SCREEN
ABO/RH(D): O POS
Antibody Screen: NEGATIVE
Unit division: 0
Unit division: 0

## 2023-07-29 LAB — BPAM RBC
Blood Product Expiration Date: 202410022359
Blood Product Expiration Date: 202410022359
ISSUE DATE / TIME: 202409061245
ISSUE DATE / TIME: 202409061546
Unit Type and Rh: 5100
Unit Type and Rh: 5100

## 2023-07-29 LAB — CBC
HCT: 25.6 % — ABNORMAL LOW (ref 36.0–46.0)
Hemoglobin: 8.4 g/dL — ABNORMAL LOW (ref 12.0–15.0)
MCH: 28.3 pg (ref 26.0–34.0)
MCHC: 32.8 g/dL (ref 30.0–36.0)
MCV: 86.2 fL (ref 80.0–100.0)
Platelets: 164 10*3/uL (ref 150–400)
RBC: 2.97 MIL/uL — ABNORMAL LOW (ref 3.87–5.11)
RDW: 15.5 % (ref 11.5–15.5)
WBC: 11 10*3/uL — ABNORMAL HIGH (ref 4.0–10.5)
nRBC: 0 % (ref 0.0–0.2)

## 2023-07-29 LAB — LACTIC ACID, PLASMA: Lactic Acid, Venous: 1.1 mmol/L (ref 0.5–1.9)

## 2023-07-29 MED ORDER — FERROUS SULFATE 325 (65 FE) MG PO TABS
325.0000 mg | ORAL_TABLET | Freq: Every day | ORAL | Status: DC
  Administered 2023-07-30 – 2023-08-01 (×3): 325 mg via ORAL
  Filled 2023-07-29 (×3): qty 1

## 2023-07-29 MED ORDER — SODIUM CHLORIDE 0.9 % IV BOLUS
250.0000 mL | Freq: Once | INTRAVENOUS | Status: AC
  Administered 2023-07-29: 250 mL via INTRAVENOUS

## 2023-07-29 NOTE — Progress Notes (Signed)
Orthopaedic Trauma Progress Note  SUBJECTIVE: Doing well this morning.  Pain controlled.  She did not have therapy yesterday due to low Hgb.   No chest pain. No SOB. No nausea/vomiting. No other complaints.  Tolerating diet and fluids.   OBJECTIVE:  Vitals:   07/29/23 0558 07/29/23 0724  BP: (!) 122/42 (!) 133/91  Pulse: 70 86  Resp: 18 16  Temp: 98.9 F (37.2 C) 98.4 F (36.9 C)  SpO2: 99% 98%    General: Sitting up in bed, no acute distress Respiratory: No increased work of breathing.  Bilateral lower extremity: Dressings clean, dry, intact.  Tenderness throughout the thighs as expected.  Ankle DF/PF intact bilaterally.  Able to wiggle toes.  Endorses sensation to light touch throughout bilateral feet.+ DP pulse, equal bilaterally  IMAGING: Stable post op imaging.   LABS:  Results for orders placed or performed during the hospital encounter of 07/26/23 (from the past 24 hour(s))  Prepare RBC (crossmatch)     Status: None   Collection Time: 07/28/23 11:30 AM  Result Value Ref Range   Order Confirmation      ORDER PROCESSED BY BLOOD BANK Performed at [facility], [address] [place], [place] [address]   Glucose, capillary     Status: Abnormal   Collection Time: 07/28/23 11:42 AM  Result Value Ref Range   Glucose-Capillary 163 (H) 70 - 99 mg/dL   Comment 1 Notify RN   Glucose, capillary     Status: Abnormal   Collection Time: 07/28/23  5:07 PM  Result Value Ref Range   Glucose-Capillary 144 (H) 70 - 99 mg/dL   Comment 1 Notify RN   CBC     Status: Abnormal   Collection Time: 07/29/23  4:47 AM  Result Value Ref Range   WBC 11.0 (H) 4.0 - 10.5 K/uL   RBC 2.97 (L) 3.87 - 5.11 MIL/uL   Hemoglobin 8.4 (L) 12.0 - 15.0 g/dL   HCT 56.4 (L) 33.2 - 95.1 %   MCV 86.2 80.0 - 100.0 fL   MCH 28.3 26.0 - 34.0 pg   MCHC 32.8 30.0 - 36.0 g/dL   RDW 88.4 16.6 - 06.3 %   Platelets 164 150 - 400 K/uL   nRBC 0.0 0.0 - 0.2 %  Basic metabolic panel     Status: Abnormal    Collection Time: 07/29/23  4:47 AM  Result Value Ref Range   Sodium 133 (L) 135 - 145 mmol/L   Potassium 3.7 3.5 - 5.1 mmol/L   Chloride 106 98 - 111 mmol/L   CO2 20 (L) 22 - 32 mmol/L   Glucose, Bld 117 (H) 70 - 99 mg/dL   BUN 10 8 - 23 mg/dL   Creatinine, Ser 0.16 0.44 - 1.00 mg/dL   Calcium 7.8 (L) 8.9 - 10.3 mg/dL   GFR, Estimated >01 >09 mL/min   Anion gap 7 5 - 15    ASSESSMENT: [name] is a 89 y.o. female, 2 Days Post-Op s/p RETROGRADE INTRAMEDULLARY NAILING BILATERAL FEMUR FRACTURES  CV/Blood loss: Acute blood loss anemia, Hgb 6.1 yesterday.  1 unit PRBCs ordered per primary team. Hemodynamically stable. Hgb 8.4 this morning.   PLAN: Weightbearing: WBAT RLE and LLE ROM: Okay for unrestricted ROM Incisional and dressing care: Reinforce dressings as needed  Showering: Okay to begin showering & getting incisions wet 07/29/2023 Orthopedic device(s): None  Pain management: Continue current multimodal regimen. Limit narcotics as able VTE prophylaxis: Lovenox, SCDs ID:  Ancef  2gm post op Foley/Lines:  No foley, KVO IVFs Impediments to Fracture Healing: Vit d level 35, no supplementation needed Dispo: PT/OT evaluation today, dispo pending.  Patient will likely require SNF.  Continue to monitor CBC.  I have signed and placed discharge Rx for pain medication, muscle relaxer, DVT prophylaxis in patient's chart  D/C recommendations: -Oxycodone and Robaxin for pain control -Eliquis 2.5 mg p.o. twice daily x 30 days for DVT prophylaxis - No additional need for Vit D supplementation  Follow - up plan: 2 weeks after d/c for wound check and repeat x-ray   Contact information:   After hours and holidays please check [email or web address] for group call information for [facility]   Alfonse Alpers, PA-C 07/29/23

## 2023-07-29 NOTE — Evaluation (Signed)
Physical Therapy Evaluation Patient Details Name: [name] MRN: [id number] DOB: 1932-11-20 Today's Date: 07/29/2023  History of Present Illness  Pt is 89 yo presenting to [facility] ED after mechanical fall and fever. X-ray with findings of bil periprosthetic femur fractures. Currently s/p retrograde IMN bil on 07/27/23. Pt is WBAT bil. PMH: GERD, HTN, R knee DJD, DM II.   Clinical Impression  [name] is 89 y.o. female admitted with above HPI and diagnosis. Patient is currently limited by functional impairments below (see PT problem list). Patient lives alone and is mod Ind at baseline with use of RW for mobility and has assist from daughter for ADL's. Patient currently limited by pain in bil LE's, weakness, and fear of falling she requires Max+2 assist for supine<>sit EOB. Once EOB pt was able to maintain seated balance and complete LE strengthening for gentle ROM. Patient will benefit from continued skilled PT interventions to address impairments and progress independence with mobility. Patient will benefit from continued inpatient follow up therapy, <3 hours/day. Acute PT will follow and progress as able.         If plan is discharge home, recommend the following: Two people to help with walking and/or transfers;Two people to help with bathing/dressing/bathroom;Direct supervision/assist for medications management;Help with stairs or ramp for entrance;Assist for transportation   Can travel by private vehicle   No    Equipment Recommendations Wheelchair (measurements PT);Wheelchair cushion (measurements PT) (TBA)  Recommendations for Other Services       Functional Status Assessment Patient has had a recent decline in their functional status and demonstrates the ability to make significant improvements in function in a reasonable and predictable amount of time.     Precautions / Restrictions Precautions Precautions: Fall Restrictions Weight Bearing Restrictions: Yes RLE Weight  Bearing: Weight bearing as tolerated LLE Weight Bearing: Weight bearing as tolerated      Mobility  Bed Mobility Overal bed mobility: Needs Assistance Bed Mobility: Supine to Sit, Sit to Supine     Supine to sit: Total assist, +2 for physical assistance, Used rails, HOB elevated Sit to supine: +2 for physical assistance, Total assist   General bed mobility comments: Total assist with max assist to support Bil LE to control knee flexion as she moved EOB. Helicopter pivot with bed pad and +2 assist to raise trunk. max assist at start and pt progressed to CGA for seated balance EOB with bil UE support.    Transfers                  Lateral/Scoot Transfers: Max assist, +2 physical assistance, +2 safety/equipment General transfer comment: pt unable to clear bottom with 2+ max assist, limited by fear of falling. pt able to follow cues for UE placement and head/hip weight shift for lateral scoot towards HOB with Max +2 and use of bed pad.    Ambulation/Gait                  Stairs            Wheelchair Mobility     Tilt Bed    Modified Rankin (Stroke Patients Only)       Balance Overall balance assessment: Needs assistance Sitting-balance support: Feet supported Sitting balance-Leahy Scale: Fair Sitting balance - Comments: slight posterior lean that she can self correct  Pertinent Vitals/Pain Pain Assessment Pain Assessment: Faces Faces Pain Scale: Hurts even more Pain Location: Bilat LEs Pain Descriptors / Indicators: Aching, Grimacing, Guarding, Sore Pain Intervention(s): Limited activity within patient's tolerance, Monitored during session, Repositioned    Home Living Family/patient expects to be discharged to:: Private residence Living Arrangements: Children Available Help at Discharge: Family Type of Home: House Home Access: Level entry;Ramped entrance       Home Layout: One level Home  Equipment: BSC/3in1;Rolling Environmental consultant (2 wheels);Transport chair;Lift chair      Prior Function Prior Level of Function : Needs assist             Mobility Comments: ambulates with RW ADLs Comments: Daughter assist with ADLs.     Extremity/Trunk Assessment   Upper Extremity Assessment Upper Extremity Assessment: Defer to OT evaluation RUE Deficits / Details: R shoulder flexion limited to 90* flexion, overall 3/5 MMT LUE Deficits / Details: L shoulder flexion 55* and 3/5 MMT    Lower Extremity Assessment Lower Extremity Assessment: Generalized weakness;RLE deficits/detail;LLE deficits/detail RLE Deficits / Details: 2+/5 for strength, limited by pain. AROM of bil knees limited to 10-80 degrees. RLE: Unable to fully assess due to pain LLE Deficits / Details: 2+/5 for strength, limited by pain. AROM of bil knees limited to 10-80 degrees. LLE: Unable to fully assess due to pain    Cervical / Trunk Assessment Cervical / Trunk Assessment: Other exceptions Cervical / Trunk Exceptions: habitus  Communication   Communication Communication: No apparent difficulties  Cognition Arousal: Alert Behavior During Therapy: WFL for tasks assessed/performed Overall Cognitive Status: Within Functional Limits for tasks assessed                                 General Comments: pt pleasant alert and oriented to situation. family present and no indication of change in cognitive status.        General Comments General comments (skin integrity, edema, etc.): VSS on RA    Exercises General Exercises - Lower Extremity Long Arc Quad: AROM, Both, 5 reps, Seated   Assessment/Plan    PT Assessment Patient needs continued PT services  PT Problem List Decreased strength;Decreased range of motion;Decreased activity tolerance;Decreased balance;Decreased mobility;Decreased coordination;Decreased cognition;Decreased knowledge of use of DME;Decreased safety awareness;Decreased knowledge of  precautions;Obesity;Pain;Cardiopulmonary status limiting activity       PT Treatment Interventions DME instruction;Manual techniques;Wheelchair mobility training;Patient/family education;Cognitive remediation;Neuromuscular re-education;Balance training;Therapeutic exercise;Therapeutic activities;Functional mobility training;Stair training;Gait training    PT Goals (Current goals can be found in the Care Plan section)  Acute Rehab PT Goals Patient Stated Goal: not fall again PT Goal Formulation: With patient Time For Goal Achievement: 08/12/23 Potential to Achieve Goals: Good    Frequency Min 1X/week     Co-evaluation               AM-PAC PT "6 Clicks" Mobility  Outcome Measure Help needed turning from your back to your side while in a flat bed without using bedrails?: Total Help needed moving from lying on your back to sitting on the side of a flat bed without using bedrails?: Total Help needed moving to and from a bed to a chair (including a wheelchair)?: Total Help needed standing up from a chair using your arms (e.g., wheelchair or bedside chair)?: Total Help needed to walk in hospital room?: Total Help needed climbing 3-5 steps with a railing? : Total 6 Click Score: 6    End of  Session Equipment Utilized During Treatment: Gait belt Activity Tolerance: Patient tolerated treatment well;Patient limited by pain Patient left: in bed;with call bell/phone within reach;with bed alarm set;with family/visitor present Nurse Communication: Mobility status PT Visit Diagnosis: Other abnormalities of gait and mobility (R26.89);Muscle weakness (generalized) (M62.81);Difficulty in walking, not elsewhere classified (R26.2);Unsteadiness on feet (R26.81)    Time: 1131-1203 PT Time Calculation (min) (ACUTE ONLY): 32 min   Charges:   PT Evaluation $PT Eval Moderate Complexity: 1 Mod   PT General Charges $$ ACUTE PT VISIT: 1 Visit         Wynn Maudlin, DPT Acute Rehabilitation  Services Office [phone]  07/29/23 3:40 PM

## 2023-07-29 NOTE — Evaluation (Signed)
Occupational Therapy Evaluation Patient Details Name: AILEN ATKIN MRN: [id number] DOB: Apr 22, 1932 Today's Date: 07/29/2023   History of Present Illness Pt is 89 yo presenting to [facility] ED after mechanical fall and fever. X-ray with findings of bil periprosthetic femur fractures. Currently s/p retrograde IMN bil on 07/27/23. Pt is WBAT bil. PMH: GERD, HTN, R knee DJD, DM II.   Clinical Impression   Pt admitted for above and presents with BLE pain with movement, she is also generally weak in her BUEs and is unable to tolerate OOB mobility at this time. Pt able to participate in bed mobility with cues to use rails but still needs Total A +2 to get to EOB, due to her UE deficits she needs Mod A with UB ADLs and is Mod to Total A for LB ADLs. Pt cognition is good and may do well with AE education, would benefit from continued acute skilled OT services to address deficits and help transition to next level of care. Patient would benefit from post acute skilled rehab facility with <3 hours of therapy and 24/7 support       If plan is discharge home, recommend the following: Two people to help with walking and/or transfers;A lot of help with bathing/dressing/bathroom;Assistance with cooking/housework    Functional Status Assessment  Patient has had a recent decline in their functional status and demonstrates the ability to make significant improvements in function in a reasonable and predictable amount of time.  Equipment Recommendations  None recommended by OT (defer to next level of care)    Recommendations for Other Services       Precautions / Restrictions Precautions Precautions: Fall Restrictions Weight Bearing Restrictions: Yes RLE Weight Bearing: Weight bearing as tolerated LLE Weight Bearing: Weight bearing as tolerated      Mobility Bed Mobility Overal bed mobility: Needs Assistance Bed Mobility: Supine to Sit, Sit to Supine     Supine to sit: Total assist, +2 for physical  assistance, Used rails, HOB elevated Sit to supine: +2 for physical assistance, Total assist   General bed mobility comments: Pt CGA for static sitting, may lean posteriorly but with cues to shift weight forward is correctable    Transfers                   General transfer comment: Pt not able to clear bottom from EOB, likely also limited by fear of pain      Balance Overall balance assessment: Needs assistance Sitting-balance support: Feet supported Sitting balance-Leahy Scale: Fair Sitting balance - Comments: slight posterior lean that she can self correct       Standing balance comment: unable at this time                           ADL either performed or assessed with clinical judgement   ADL Overall ADL's : Needs assistance/impaired Eating/Feeding: Independent;Bed level   Grooming: Sitting;Contact guard assist;Wash/dry face Grooming Details (indicate cue type and reason): daughter assist with combing hair due to BUE deficits Upper Body Bathing: Sitting;Set up   Lower Body Bathing: Sitting/lateral leans;Moderate assistance Lower Body Bathing Details (indicate cue type and reason): simulated, able to reach 4" above ankles Upper Body Dressing : Sitting;Moderate assistance   Lower Body Dressing: Bed level;Total assistance     Toilet Transfer Details (indicate cue type and reason): unable at this time           General ADL Comments: Pt not  able to clear bottom from EOB     Vision         Perception         Praxis         Pertinent Vitals/Pain Pain Assessment Pain Assessment: Faces Faces Pain Scale: Hurts even more Pain Location: Bilat LEs Pain Descriptors / Indicators: Aching, Grimacing, Guarding, Sore Pain Intervention(s): Limited activity within patient's tolerance, Monitored during session, Premedicated before session, Repositioned     Extremity/Trunk Assessment Upper Extremity Assessment Upper Extremity Assessment:  Generalized weakness RUE Deficits / Details: R shoulder flexion limited to 90* flexion, overall 3/5 MMT LUE Deficits / Details: L shoulder flexion 55* and 3/5 MMT   Lower Extremity Assessment Lower Extremity Assessment: Defer to PT evaluation       Communication Communication Communication: No apparent difficulties   Cognition Arousal: Alert Behavior During Therapy: WFL for tasks assessed/performed Overall Cognitive Status: Within Functional Limits for tasks assessed                                       General Comments  VSS on RA    Exercises     Shoulder Instructions      Home Living Family/patient expects to be discharged to:: Private residence Living Arrangements: Children Available Help at Discharge: Family Type of Home: House Home Access: Level entry;Ramped entrance     Home Layout: One level     Bathroom Shower/Tub: Sponge bathes at baseline   Bathroom Toilet: Standard Bathroom Accessibility: Yes   Home Equipment: Teacher, English as a foreign language (2 wheels);Transport chair;Lift chair          Prior Functioning/Environment Prior Level of Function : Needs assist             Mobility Comments: ambulates with RW ADLs Comments: Daughter assist with ADLs.        OT Problem List: Impaired balance (sitting and/or standing);Pain;Decreased strength      OT Treatment/Interventions: Therapeutic exercise;Therapeutic activities;Balance training;Self-care/ADL training;Patient/family education;DME and/or AE instruction    OT Goals(Current goals can be found in the care plan section) Acute Rehab OT Goals Patient Stated Goal: To get stronger OT Goal Formulation: With patient/family Time For Goal Achievement: 08/12/23 Potential to Achieve Goals: Good ADL Goals Pt Will Perform Grooming: with set-up;with supervision;sitting Pt Will Perform Lower Body Bathing: with min assist;sitting/lateral leans Pt Will Perform Lower Body Dressing: with caregiver  independent in assisting;sitting/lateral leans Pt/caregiver will Perform Home Exercise Program: Increased strength;Both right and left upper extremity;With written HEP provided Additional ADL Goal #1: Pt will tolerate 2 mins of static standing with Mod A +2 in preparation for OOB ADLs  OT Frequency: Min 1X/week    Co-evaluation              AM-PAC OT "6 Clicks" Daily Activity     Outcome Measure Help from another person eating meals?: None Help from another person taking care of personal grooming?: A Little Help from another person toileting, which includes using toliet, bedpan, or urinal?: Total Help from another person bathing (including washing, rinsing, drying)?: A Lot Help from another person to put on and taking off regular upper body clothing?: A Lot Help from another person to put on and taking off regular lower body clothing?: Total 6 Click Score: 13   End of Session Nurse Communication: Mobility status (mainly bed level at this time, roll for pressure relief)  Activity Tolerance: Patient limited by  pain Patient left: in bed;with call bell/phone within reach;with family/visitor present;with bed alarm set  OT Visit Diagnosis: Unsteadiness on feet (R26.81);Other abnormalities of gait and mobility (R26.89);History of falling (Z91.81);Muscle weakness (generalized) (M62.81);Pain Pain - Right/Left:  (bilat) Pain - part of body: Leg                Time: 1130-1206 OT Time Calculation (min): 36 min Charges:  OT General Charges $OT Visit: 1 Visit OT Evaluation $OT Eval Moderate Complexity: 1 Mod  07/29/2023  AB, OTR/L  Acute Rehabilitation Services  Office: [phone]   Tristan Schroeder 07/29/2023, 1:21 PM

## 2023-07-29 NOTE — Progress Notes (Addendum)
PROGRESS NOTE    [name]  ZOX:[id number] DOB: 1932-08-24 DOA: 07/26/2023 PCP: Duanne Limerick, MD   Brief Narrative: 89 year old with past medical history significant for GERD, hypertension, diabetes type 2 presented to the ED after mechanical fall, and fever evaluation x-ray left and right femur demonstrated bilateral femur fracture.   Assessment & Plan:   Principal Problem:   Femur fracture (HCC)  1-Mechanical fall, bilateral femur fracture -Underwent surgery on 9/5: Retrograde intramedullary nailing of right femur fracture, Retrograde intramedullary nailing of left femur fracture Pain management per ortho.  DVT prophylaxis per ortho Bowel regimen,. Continue  Miralax, senna.   2-Acute Blood loss anemia: post sx, expected.  Received 2 units PRBC 9/06.  Hb increased to 8 post transfusion.   Left Shoulder pain: x ray negative for fracture, showed osteoarthritis.   Hypertension: Continue with amlodipine.  Agree with holding ARB  GERD: Continue with PPI  Glaucoma: Continue with eyedrops  Diabetes type 2: Check CBG and sliding scale insulin   Pressure Injury 06/13/23 Buttocks Right;Left Stage 2 -  Partial thickness loss of dermis presenting as a shallow open injury with a red, pink wound bed without slough. healing pressure injury. no drainage noted. pt states she sees wound care regularly. (Active)  06/13/23   Location: Buttocks  Location Orientation: Right;Left  Staging: Stage 2 -  Partial thickness loss of dermis presenting as a shallow open injury with a red, pink wound bed without slough.  Wound Description (Comments): healing pressure injury. no drainage noted. pt states she sees wound care regularly.  Present on Admission: Yes       Estimated body mass index is 28.87 kg/m as calculated from the following:   Height as of 06/28/23: 5\' 3"  (1.6 m).   Weight as of 06/28/23: 73.9 kg.   DVT prophylaxis: SCD Code Status: Full code Family Communication: Daughter  at bedside 9/06 Disposition Plan:  Status is: Inpatient Remains inpatient appropriate because: femur fracture    Consultants:  ortho  Procedures:    Antimicrobials:    Subjective: Alert, feels better, denies dizziness  Objective: Vitals:   07/28/23 1814 07/28/23 2052 07/29/23 0558 07/29/23 0724  BP: (!) 122/51 (!) 113/59 (!) 122/42 (!) 133/91  Pulse: 99 (!) 102 70 86  Resp: 16 16 18 16   Temp: 100 F (37.8 C) 98.9 F (37.2 C) 98.9 F (37.2 C) 98.4 F (36.9 C)  TempSrc: Oral Oral Oral Oral  SpO2: 100% 97% 99% 98%    Intake/Output Summary (Last 24 hours) at 07/29/2023 1414 Last data filed at 07/28/2023 2030 Gross per 24 hour  Intake 1697.02 ml  Output 900 ml  Net 797.02 ml   There were no vitals filed for this visit.  Examination:  General exam: NAD Respiratory system: CTA Cardiovascular system: S 1, S 2 RRR Gastrointestinal system: BS present, soft, nt Central nervous system: alert Extremities: BL LE dressing BL knee     Data Reviewed: I have personally reviewed following labs and imaging studies  CBC: Recent Labs  Lab 07/26/23 1929 07/27/23 1622 07/28/23 0807 07/29/23 0447  WBC 9.8 13.9* 9.8 11.0*  NEUTROABS 7.8*  --   --   --   HGB 9.3* 7.8* 6.1* 8.4*  HCT 29.3* 24.7* 19.0* 25.6*  MCV 83.7 87.3 86.8 86.2  PLT 310 223 186 164   Basic Metabolic Panel: Recent Labs  Lab 07/26/23 1929 07/27/23 1622 07/28/23 0807 07/29/23 0447  NA 135  --  138 133*  K 4.1  --  4.1 3.7  CL 104  --  110 106  CO2 18*  --  21* 20*  GLUCOSE 160*  --  134* 117*  BUN 20  --  15 10  CREATININE 0.67 0.58 0.59 0.50  CALCIUM 8.9  --  7.7* 7.8*   GFR: CrCl cannot be calculated (Unknown ideal weight.). Liver Function Tests: Recent Labs  Lab 07/26/23 1929  AST 15  ALT 13  ALKPHOS 73  BILITOT 0.6  PROT 7.3  ALBUMIN 3.2*   No results for input(s): "LIPASE", "AMYLASE" in the last 168 hours. No results for input(s): "AMMONIA" in the last 168 hours. Coagulation  Profile: Recent Labs  Lab 07/26/23 1929  INR 1.1   Cardiac Enzymes: No results for input(s): "CKTOTAL", "CKMB", "CKMBINDEX", "TROPONINI" in the last 168 hours. BNP (last 3 results) No results for input(s): "PROBNP" in the last 8760 hours. HbA1C: No results for input(s): "HGBA1C" in the last 72 hours. CBG: Recent Labs  Lab 07/27/23 1722 07/27/23 2209 07/28/23 0809 07/28/23 1142 07/28/23 1707  GLUCAP 122* 185* 140* 163* 144*   Lipid Profile: No results for input(s): "CHOL", "HDL", "LDLCALC", "TRIG", "CHOLHDL", "LDLDIRECT" in the last 72 hours. Thyroid Function Tests: No results for input(s): "TSH", "T4TOTAL", "FREET4", "T3FREE", "THYROIDAB" in the last 72 hours. Anemia Panel: No results for input(s): "VITAMINB12", "FOLATE", "FERRITIN", "TIBC", "IRON", "RETICCTPCT" in the last 72 hours. Sepsis Labs: No results for input(s): "PROCALCITON", "LATICACIDVEN" in the last 168 hours.  No results found for this or any previous visit (from the past 240 hour(s)).       Radiology Studies: DG Shoulder Left  Result Date: 07/28/2023 CLINICAL DATA:  Severe left shoulder pain EXAM: LEFT SHOULDER - 2+ VIEW COMPARISON:  02/09/2023 FINDINGS: AC joint appears intact. No acute fracture or malalignment. Severe arthritis of the glenohumeral joint with bone on bone appearance, subarticular sclerosis and lucency and inferior and superior osteophytosis. IMPRESSION: Severe arthritis of the glenohumeral joint. Electronically Signed   By: Jasmine Pang M.D.   On: 07/28/2023 20:26        Scheduled Meds:  amLODipine  10 mg Oral Daily   docusate sodium  100 mg Oral BID   enoxaparin (LOVENOX) injection  40 mg Subcutaneous Q24H   glipiZIDE  2.5 mg Oral Q breakfast   latanoprost  1 drop Both Eyes QHS   pantoprazole  40 mg Oral Daily   polyethylene glycol  17 g Oral BID   senna  1 tablet Oral Daily   Continuous Infusions:  methocarbamol (ROBAXIN) IV       LOS: 3 days    Time spent: 35  minutes    Aaralyn Kil A Darious Rehman, MD [facility]   If 7PM-7AM, please contact night-coverage [email or web address]  07/29/2023, 2:14 PM

## 2023-07-30 DIAGNOSIS — S72401A Unspecified fracture of lower end of right femur, initial encounter for closed fracture: Secondary | ICD-10-CM | POA: Diagnosis not present

## 2023-07-30 LAB — URINALYSIS, ROUTINE W REFLEX MICROSCOPIC
Bilirubin Urine: NEGATIVE
Glucose, UA: NEGATIVE mg/dL
Ketones, ur: NEGATIVE mg/dL
Nitrite: NEGATIVE
Protein, ur: 30 mg/dL — AB
Specific Gravity, Urine: 1.023 (ref 1.005–1.030)
WBC, UA: >50 WBC/hpf (ref 0–5)
pH: 5 (ref 5.0–8.0)

## 2023-07-30 LAB — COMPREHENSIVE METABOLIC PANEL
ALT: 14 U/L (ref 0–44)
AST: 19 U/L (ref 15–41)
Albumin: 2.1 g/dL — ABNORMAL LOW (ref 3.5–5.0)
Alkaline Phosphatase: 65 U/L (ref 38–126)
Anion gap: 10 (ref 5–15)
BUN: 16 mg/dL (ref 8–23)
CO2: 23 mmol/L (ref 22–32)
Calcium: 8.4 mg/dL — ABNORMAL LOW (ref 8.9–10.3)
Chloride: 104 mmol/L (ref 98–111)
Creatinine, Ser: 0.6 mg/dL (ref 0.44–1.00)
GFR, Estimated: >60 mL/min (ref >60)
Glucose, Bld: 114 mg/dL — ABNORMAL HIGH (ref 70–99)
Potassium: 4.2 mmol/L (ref 3.5–5.1)
Sodium: 137 mmol/L (ref 135–145)
Total Bilirubin: 0.9 mg/dL (ref 0.3–1.2)
Total Protein: 5.4 g/dL — ABNORMAL LOW (ref 6.5–8.1)

## 2023-07-30 LAB — RETICULOCYTES
Immature Retic Fract: 17.4 % — ABNORMAL HIGH (ref 2.3–15.9)
RBC.: 2.84 MIL/uL — ABNORMAL LOW (ref 3.87–5.11)
Retic Count, Absolute: 42.9 10*3/uL (ref 19.0–186.0)
Retic Ct Pct: 1.5 % (ref 0.4–3.1)

## 2023-07-30 LAB — CBC
HCT: 25.4 % — ABNORMAL LOW (ref 36.0–46.0)
Hemoglobin: 8.5 g/dL — ABNORMAL LOW (ref 12.0–15.0)
MCH: 29.2 pg (ref 26.0–34.0)
MCHC: 33.5 g/dL (ref 30.0–36.0)
MCV: 87.3 fL (ref 80.0–100.0)
Platelets: 185 10*3/uL (ref 150–400)
RBC: 2.91 MIL/uL — ABNORMAL LOW (ref 3.87–5.11)
RDW: 15.5 % (ref 11.5–15.5)
WBC: 10.1 10*3/uL (ref 4.0–10.5)
nRBC: 0 % (ref 0.0–0.2)

## 2023-07-30 LAB — FOLATE: Folate: 5 ng/mL — ABNORMAL LOW (ref >5.9)

## 2023-07-30 LAB — LACTIC ACID, PLASMA: Lactic Acid, Venous: 0.9 mmol/L (ref 0.5–1.9)

## 2023-07-30 LAB — IRON AND TIBC
Iron: 16 ug/dL — ABNORMAL LOW (ref 28–170)
Saturation Ratios: 7 % — ABNORMAL LOW (ref 10.4–31.8)
TIBC: 223 ug/dL — ABNORMAL LOW (ref 250–450)
UIBC: 207 ug/dL

## 2023-07-30 LAB — VITAMIN B12: Vitamin B-12: 945 pg/mL — ABNORMAL HIGH (ref 180–914)

## 2023-07-30 LAB — PROCALCITONIN: Procalcitonin: 0.13 ng/mL

## 2023-07-30 LAB — FERRITIN: Ferritin: 141 ng/mL (ref 11–307)

## 2023-07-30 MED ORDER — FOLIC ACID 1 MG PO TABS
1.0000 mg | ORAL_TABLET | Freq: Every day | ORAL | Status: DC
  Administered 2023-07-30 – 2023-08-01 (×3): 1 mg via ORAL
  Filled 2023-07-30 (×3): qty 1

## 2023-07-30 MED ORDER — SODIUM CHLORIDE 0.9 % IV SOLN
1.0000 g | INTRAVENOUS | Status: DC
  Administered 2023-07-30 – 2023-08-01 (×3): 1 g via INTRAVENOUS
  Filled 2023-07-30 (×3): qty 10

## 2023-07-30 MED ORDER — ENSURE ENLIVE PO LIQD
237.0000 mL | Freq: Three times a day (TID) | ORAL | Status: DC
  Administered 2023-07-30 – 2023-08-01 (×7): 237 mL via ORAL

## 2023-07-30 MED ORDER — AMLODIPINE BESYLATE 5 MG PO TABS
5.0000 mg | ORAL_TABLET | Freq: Every day | ORAL | Status: DC
  Administered 2023-07-31 – 2023-08-01 (×2): 5 mg via ORAL
  Filled 2023-07-30 (×2): qty 1

## 2023-07-30 MED ORDER — SODIUM CHLORIDE 0.9 % IV SOLN: INTRAVENOUS | Status: DC

## 2023-07-30 MED ORDER — BISACODYL 10 MG RE SUPP: 10.0000 mg | Freq: Every day | RECTAL | Status: DC | PRN

## 2023-07-30 NOTE — Progress Notes (Signed)
PROGRESS NOTE    [name]  NWG:[id number] DOB: 05-19-1932 DOA: 07/26/2023 PCP: Duanne Limerick, MD   Brief Narrative: 89 year old with past medical history significant for GERD, hypertension, diabetes type 2 presented to the ED after mechanical fall, and fever evaluation x-ray left and right femur demonstrated bilateral femur fracture.   Assessment & Plan:   Principal Problem:   Femur fracture (HCC)  1-Mechanical fall, bilateral femur fracture -Underwent surgery on 9/5: Retrograde intramedullary nailing of right femur fracture, Retrograde intramedullary nailing of left femur fracture Pain management per ortho.  DVT prophylaxis per ortho Bowel regimen,. Continue  Miralax, senna.   2-Acute Blood loss anemia: post sx, expected.  Received 2 units PRBC 9/06.  Hb increased to 8 post transfusion.  Hb stable.   Fever; secondary to UTI UA with more than 50 WBC.  Start IV ceftriaxone.  Follow urine culture.  IV fluids  Left Shoulder pain: x ray negative for fracture, showed osteoarthritis.   Hypertension: Continue with amlodipine.  Agree with holding ARB  GERD: Continue with PPI  Glaucoma: Continue with eyedrops  Diabetes type 2: Check CBG and sliding scale insulin   Pressure Injury 06/13/23 Buttocks Right;Left Stage 2 -  Partial thickness loss of dermis presenting as a shallow open injury with a red, pink wound bed without slough. healing pressure injury. no drainage noted. pt states she sees wound care regularly. (Active)  06/13/23   Location: Buttocks  Location Orientation: Right;Left  Staging: Stage 2 -  Partial thickness loss of dermis presenting as a shallow open injury with a red, pink wound bed without slough.  Wound Description (Comments): healing pressure injury. no drainage noted. pt states she sees wound care regularly.  Present on Admission: Yes       Estimated body mass index is 28.87 kg/m as calculated from the following:   Height as of 06/28/23: 5'  3" (1.6 m).   Weight as of 06/28/23: 73.9 kg.   DVT prophylaxis: SCD Code Status: Full code Family Communication: Daughter at bedside 9/08 Disposition Plan:  Status is: Inpatient Remains inpatient appropriate because: femur fracture    Consultants:  ortho  Procedures:    Antimicrobials:    Subjective: She relates feeling ok, unable to urinate.  Had fever last night.  Had smear of stool.   Objective: Vitals:   07/29/23 2247 07/30/23 0146 07/30/23 0452 07/30/23 0837  BP: (!) 98/48 (!) 99/58 (!) 105/51 (!) 96/53  Pulse: 70 70 63 68  Resp: 16 19 15 17   Temp: 98 F (36.7 C) 99.2 F (37.3 C) 98.6 F (37 C) 98 F (36.7 C)  TempSrc: Oral Oral Oral Oral  SpO2: 93% 94% 92% 90%    Intake/Output Summary (Last 24 hours) at 07/30/2023 1439 Last data filed at 07/30/2023 1415 Gross per 24 hour  Intake 600 ml  Output 400 ml  Net 200 ml   There were no vitals filed for this visit.  Examination:  General exam: NAD Respiratory system: CTA Cardiovascular system: S 1, S 2 RRR Gastrointestinal system:BS present, soft, nt Central nervous system: alert Extremities: BL LE dressing BL knee     Data Reviewed: I have personally reviewed following labs and imaging studies  CBC: Recent Labs  Lab 07/26/23 1929 07/27/23 1622 07/28/23 0807 07/29/23 0447 07/29/23 2329 07/30/23 0855  WBC 9.8 13.9* 9.8 11.0* 10.9* 10.1  NEUTROABS 7.8*  --   --   --  7.0  --   HGB 9.3* 7.8* 6.1* 8.4* 8.2* 8.5*  HCT 29.3* 24.7* 19.0* 25.6* 25.2* 25.4*  MCV 83.7 87.3 86.8 86.2 86.0 87.3  PLT 310 223 186 164 177 185   Basic Metabolic Panel: Recent Labs  Lab 07/26/23 1929 07/27/23 1622 07/28/23 0807 07/29/23 0447 07/29/23 2329  NA 135  --  138 133* 137  K 4.1  --  4.1 3.7 4.2  CL 104  --  110 106 104  CO2 18*  --  21* 20* 23  GLUCOSE 160*  --  134* 117* 114*  BUN 20  --  15 10 16   CREATININE 0.67 0.58 0.59 0.50 0.60  CALCIUM 8.9  --  7.7* 7.8* 8.4*   GFR: CrCl cannot be calculated  (Unknown ideal weight.). Liver Function Tests: Recent Labs  Lab 07/26/23 1929 07/29/23 2329  AST 15 19  ALT 13 14  ALKPHOS 73 65  BILITOT 0.6 0.9  PROT 7.3 5.4*  ALBUMIN 3.2* 2.1*   No results for input(s): "LIPASE", "AMYLASE" in the last 168 hours. No results for input(s): "AMMONIA" in the last 168 hours. Coagulation Profile: Recent Labs  Lab 07/26/23 1929  INR 1.1   Cardiac Enzymes: No results for input(s): "CKTOTAL", "CKMB", "CKMBINDEX", "TROPONINI" in the last 168 hours. BNP (last 3 results) No results for input(s): "PROBNP" in the last 8760 hours. HbA1C: No results for input(s): "HGBA1C" in the last 72 hours. CBG: Recent Labs  Lab 07/27/23 1722 07/27/23 2209 07/28/23 0809 07/28/23 1142 07/28/23 1707  GLUCAP 122* 185* 140* 163* 144*   Lipid Profile: No results for input(s): "CHOL", "HDL", "LDLCALC", "TRIG", "CHOLHDL", "LDLDIRECT" in the last 72 hours. Thyroid Function Tests: No results for input(s): "TSH", "T4TOTAL", "FREET4", "T3FREE", "THYROIDAB" in the last 72 hours. Anemia Panel: Recent Labs    07/30/23 0216  VITAMINB12 945*  FOLATE 5.0*  FERRITIN 141  TIBC 223*  IRON 16*  RETICCTPCT 1.5   Sepsis Labs: Recent Labs  Lab 07/29/23 2329 07/30/23 0127  PROCALCITON 0.13  --   LATICACIDVEN 1.1 0.9    Recent Results (from the past 240 hour(s))  Culture, blood (x 2)     Status: None (Preliminary result)   Collection Time: 07/29/23 11:25 PM   Specimen: BLOOD RIGHT ARM  Result Value Ref Range Status   Specimen Description BLOOD RIGHT ARM  Final   Special Requests   Final    BOTTLES DRAWN AEROBIC AND ANAEROBIC Blood Culture adequate volume   Culture   Final    NO GROWTH < 12 HOURS Performed at [facility], [address] [place], [place] [address]    Report Status PENDING  Incomplete  Culture, blood (x 2)     Status: None (Preliminary result)   Collection Time: 07/29/23 11:26 PM   Specimen: BLOOD LEFT ARM  Result Value Ref Range Status    Specimen Description BLOOD LEFT ARM  Final   Special Requests   Final    BOTTLES DRAWN AEROBIC AND ANAEROBIC Blood Culture adequate volume   Culture   Final    NO GROWTH < 12 HOURS Performed at [facility], [address], [place], [place] [address]    Report Status PENDING  Incomplete         Radiology Studies: DG Shoulder Left  Result Date: 07/28/2023 CLINICAL DATA:  Severe left shoulder pain EXAM: LEFT SHOULDER - 2+ VIEW COMPARISON:  02/09/2023 FINDINGS: AC joint appears intact. No acute fracture or malalignment. Severe arthritis of the glenohumeral joint with bone on bone appearance, subarticular sclerosis and lucency  and inferior and superior osteophytosis. IMPRESSION: Severe arthritis of the glenohumeral joint. Electronically Signed   By: Jasmine Pang M.D.   On: 07/28/2023 20:26        Scheduled Meds:  amLODipine  10 mg Oral Daily   docusate sodium  100 mg Oral BID   enoxaparin (LOVENOX) injection  40 mg Subcutaneous Q24H   feeding supplement  237 mL Oral TID BM   ferrous sulfate  325 mg Oral Q breakfast   folic acid  1 mg Oral Daily   glipiZIDE  2.5 mg Oral Q breakfast   latanoprost  1 drop Both Eyes QHS   pantoprazole  40 mg Oral Daily   polyethylene glycol  17 g Oral BID   senna  1 tablet Oral Daily   Continuous Infusions:  cefTRIAXone (ROCEPHIN)  IV 1 g (07/30/23 1222)   methocarbamol (ROBAXIN) IV       LOS: 4 days    Time spent: 35 minutes    Donnell Beauchamp A Harveen Flesch, MD [facility]   If 7PM-7AM, please contact night-coverage [email or web address]  07/30/2023, 2:39 PM

## 2023-07-30 NOTE — Progress Notes (Signed)
    Patient Name: [name]           DOB: [birth year]  MRN: [id number]      Admission Date: 07/26/2023  Attending Provider: Alba Cory, MD  Primary Diagnosis: Femur fracture [facility])   Level of care: Med-Surg    CROSS COVER NOTE   Date of Service   07/30/2023   [name], 89 y.o. female, was admitted on 07/26/2023 for Femur fracture [facility]).    HPI/Events of Note   Febrile, 101.3 F No reports of acute urinary, respiratory, or abdominal changes.  No cough, sputum production, chills reported.  No changes to surgical site. On admission WBC 13.9, has been trending down. Infection workup--> blood culture, urinalysis, CBC, CMP, lactic acid, procalcitonin.  Follow fever trend, okay to hold off on antibiotics for now.   Interventions/ Plan   Above        Anthoney Harada, DNP, [facility] [place]

## 2023-07-30 NOTE — [place] FL2 (Addendum)
[place] MEDICAID FL2 LEVEL OF CARE FORM     IDENTIFICATION  Patient Name: [name] Birthdate: [birth year] Sex: female Admission Date (Current Location): 07/26/2023  [facility] and [place] Number:  Producer, television/film/video and Address:  [facility] [place], [address], [place], [place] [address]      Provider Number: [id number]  Attending Physician Name and Address:  Alba Cory, MD  Relative Name and Phone Number:  Aggie Cosier- daughter  [phone]    Current Level of Care: Hospital Recommended Level of Care: Skilled Nursing Facility Prior Approval Number:    Date Approved/Denied:   PASRR Number:  [id number]  Discharge Plan: SNF    Current Diagnoses: Patient Active Problem List   Diagnosis Date Noted   Femur fracture (HCC) 07/26/2023   Postural dizziness with presyncope 06/12/2023   Symptomatic bradycardia 06/12/2023   Headache 06/12/2023   History of supraventricular tachycardia 06/12/2023   Hypertensive urgency 06/12/2023   Valvular heart disease 07/23/2021   Chronic pain of left knee 06/17/2021   Chronic pain syndrome 06/17/2021   Near syncope 04/12/2021   Pain due to total left knee replacement (HCC) 03/02/2021   Aortic valve regurgitation 10/23/2020   Bradycardia 09/04/2019   PVC's (premature ventricular contractions) 09/04/2019   Leg edema 09/04/2019   Sinus bradycardia 03/16/2018   PSVT (paroxysmal supraventricular tachycardia) 03/16/2018   NSVT (nonsustained ventricular tachycardia) (HCC) 03/16/2018   Malignant HTN with heart disease, w/o CHF, w/o chronic kidney disease 02/05/2018   Hyperlipidemia 09/24/2015   Diabetes mellitus with no complication (HCC) 03/16/2015   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   Arthritis of knee, degenerative 03/16/2015   Sinus infection 03/16/2015   DM (diabetes mellitus) (HCC)    Essential hypertension    GERD (gastroesophageal reflux disease)    History of bilateral  knee replacement     Orientation RESPIRATION BLADDER Height & Weight     Self, Time, Situation, Place  Normal External catheter (placed 9/5) Weight:   Height:     BEHAVIORAL SYMPTOMS/MOOD NEUROLOGICAL BOWEL NUTRITION STATUS      Continent Diet (heart healthy, thin liquids)  AMBULATORY STATUS COMMUNICATION OF NEEDS Skin   Extensive Assist Verbally Surgical wounds (closed incision leg- foam dressing. Open wound on sacrum- foam dressing. Open wound right knee- foam dressing. Open wound left knee- foam dressing)                       Personal Care Assistance Level of Assistance  Bathing, Feeding, Dressing Bathing Assistance: Maximum assistance Feeding assistance: Independent Dressing Assistance: Maximum assistance     Functional Limitations Info  Sight, Hearing, Speech Sight Info: Adequate Hearing Info: Adequate Speech Info: Adequate    SPECIAL CARE FACTORS FREQUENCY  PT (By licensed PT), OT (By licensed OT)     PT Frequency: 5x OT Frequency: 5x            Contractures Contractures Info: Not present    Additional Factors Info  Code Status, Allergies Code Status Info: Full Code Allergies Info: Metoprolol, Nsaids, Lisinopril           Current Medications (07/30/2023):  This is the current hospital active medication list Current Facility-Administered Medications  Medication Dose Route Frequency Provider Last Rate Last Admin   acetaminophen (TYLENOL) tablet 650 mg  650 mg Oral Q6H PRN West Bali, PA-C   650 mg at 07/30/23 8469   Or   acetaminophen (TYLENOL) suppository 650 mg  650 mg Rectal  Q6H PRN West Bali, PA-C       amLODipine (NORVASC) tablet 10 mg  10 mg Oral Daily West Bali, PA-C   10 mg at 07/30/23 1610   bisacodyl (DULCOLAX) suppository 10 mg  10 mg Rectal Daily PRN Regalado, Belkys A, MD       cefTRIAXone (ROCEPHIN) 1 g in sodium chloride 0.9 % 100 mL IVPB  1 g Intravenous Q24H Regalado, Belkys A, MD 200 mL/hr at 07/30/23 1222 1 g at  07/30/23 1222   diphenhydrAMINE (BENADRYL) 12.5 MG/5ML elixir 12.5-25 mg  12.5-25 mg Oral Q4H PRN West Bali, PA-C       docusate sodium (COLACE) capsule 100 mg  100 mg Oral BID Thyra Breed A, PA-C   100 mg at 07/30/23 0931   enoxaparin (LOVENOX) injection 40 mg  40 mg Subcutaneous Q24H Thyra Breed A, PA-C   40 mg at 07/30/23 9604   feeding supplement (ENSURE ENLIVE / ENSURE PLUS) liquid 237 mL  237 mL Oral TID BM Regalado, Belkys A, MD   237 mL at 07/30/23 1225   fentaNYL (SUBLIMAZE) injection 12.5-50 mcg  12.5-50 mcg Intravenous Q2H PRN West Bali, PA-C   50 mcg at 07/29/23 1136   ferrous sulfate tablet 325 mg  325 mg Oral Q breakfast Regalado, Belkys A, MD   325 mg at 07/30/23 5409   folic acid (FOLVITE) tablet 1 mg  1 mg Oral Daily Regalado, Belkys A, MD   1 mg at 07/30/23 0922   glipiZIDE (GLUCOTROL XL) 24 hr tablet 2.5 mg  2.5 mg Oral Q breakfast Thyra Breed A, PA-C   2.5 mg at 07/30/23 1223   latanoprost (XALATAN) 0.005 % ophthalmic solution 1 drop  1 drop Both Eyes QHS Thyra Breed A, PA-C   1 drop at 07/29/23 2035   methocarbamol (ROBAXIN) tablet 500 mg  500 mg Oral Q6H PRN West Bali, PA-C   500 mg at 07/30/23 8119   Or   methocarbamol (ROBAXIN) 500 mg in dextrose 5 % 50 mL IVPB  500 mg Intravenous Q6H PRN Sharon Seller, Sarah A, PA-C       metoCLOPramide (REGLAN) tablet 5-10 mg  5-10 mg Oral Q8H PRN Sharon Seller, Sarah A, PA-C       Or   metoCLOPramide (REGLAN) injection 5-10 mg  5-10 mg Intravenous Q8H PRN Sharon Seller, Sarah A, PA-C       ondansetron (ZOFRAN) tablet 4 mg  4 mg Oral Q6H PRN Sharon Seller, Sarah A, PA-C       Or   ondansetron (ZOFRAN) injection 4 mg  4 mg Intravenous Q6H PRN McClung, Sarah A, PA-C       oxyCODONE (Oxy IR/ROXICODONE) immediate release tablet 5 mg  5 mg Oral Q4H PRN Thyra Breed A, PA-C   5 mg at 07/30/23 0935   pantoprazole (PROTONIX) EC tablet 40 mg  40 mg Oral Daily Thyra Breed A, PA-C   40 mg at 07/30/23 1478   polyethylene glycol  (MIRALAX / GLYCOLAX) packet 17 g  17 g Oral BID Regalado, Belkys A, MD   17 g at 07/30/23 2956   senna (SENOKOT) tablet 8.6 mg  1 tablet Oral Daily Regalado, Belkys A, MD   8.6 mg at 07/30/23 2130     Discharge Medications: Please see discharge summary for a list of discharge medications.  Relevant Imaging Results:  Relevant Lab Results:   Additional Information SSN: [id number]  Maree Krabbe, LCSW

## 2023-07-30 NOTE — TOC Initial Note (Signed)
Transition of Care [facility]) - Initial/Assessment Note    Patient Details  Name: [name] MRN: [id number] Date of Birth: [birth year]  Transition of Care [facility]) CM/SW Contact:    Maree Krabbe, LCSW Phone Number: 07/30/2023, 2:32 PM  Clinical Narrative:       CSW spoke with pt in regards to SNF and pt asked that CSW reach out to her daughter Aggie Cosier. CSW spoke with daughter Aggie Cosier and she is agreeable for CSW to send pt's referral to [facility] facilities so they can get a better idea of pt's options. Pt's daughter is also receptive to a list so she can begin to research the facilities. CSW will email pt's daughter a list. CSW will send referral and follow up with pt's daughter once bed offers are available.             Expected Discharge Plan: Skilled Nursing Facility Barriers to Discharge: Continued Medical Work up   Patient Goals and CMS Choice Patient states their goals for this hospitalization and ongoing recovery are:: to get stronger CMS Medicare.gov Compare Post Acute Care list provided to:: Patient Represenative (must comment) (daughter) Choice offered to / list presented to : Patient, Adult Children [place] ownership interest in [facility].provided to:: Patient    Expected Discharge Plan and Services In-house Referral: Clinical Social Work   Post Acute Care Choice: Skilled Nursing Facility Living arrangements for the past 2 months: Single Family Home                   DME Agency: NA                  Prior Living Arrangements/Services Living arrangements for the past 2 months: Single Family Home Lives with:: Self Patient language and need for interpreter reviewed:: Yes Do you feel safe going back to the place where you live?: Yes      Need for Family Participation in Patient Care: No (Comment) Care giver support system in place?: Yes (comment)   Criminal Activity/Legal Involvement Pertinent to Current Situation/Hospitalization: No - Comment as  needed  Activities of Daily Living      Permission Sought/Granted Permission sought to share information with : Family Supports Permission granted to share information with : Yes, Verbal Permission Granted  Share Information with NAME: Harvey,Theresa     Permission granted to share info w Relationship: daughter  Permission granted to share info w Contact Information: [phone]  Emotional Assessment Appearance:: Appears stated age Attitude/Demeanor/Rapport: Engaged Affect (typically observed): Accepting Orientation: : Oriented to Self, Oriented to Place, Oriented to  Time, Oriented to Situation Alcohol / Substance Use: Not Applicable Psych Involvement: No (comment)  Admission diagnosis:  Femur fracture (HCC) [S72.90XA] Closed fracture of distal end of right femur, unspecified fracture morphology, initial encounter (HCC) [S72.401A] Closed fracture of distal end of left femur, unspecified fracture morphology, initial encounter [facility]) [S72.402A] Patient Active Problem List   Diagnosis Date Noted   Femur fracture (HCC) 07/26/2023   Postural dizziness with presyncope 06/12/2023   Symptomatic bradycardia 06/12/2023   Headache 06/12/2023   History of supraventricular tachycardia 06/12/2023   Hypertensive urgency 06/12/2023   Valvular heart disease 07/23/2021   Chronic pain of left knee 06/17/2021   Chronic pain syndrome 06/17/2021   Near syncope 04/12/2021   Pain due to total left knee replacement (HCC) 03/02/2021   Aortic valve regurgitation 10/23/2020   Bradycardia 09/04/2019   PVC's (premature ventricular contractions) 09/04/2019   Leg edema 09/04/2019  Sinus bradycardia 03/16/2018   PSVT (paroxysmal supraventricular tachycardia) 03/16/2018   NSVT (nonsustained ventricular tachycardia) (HCC) 03/16/2018   Malignant HTN with heart disease, w/o CHF, w/o chronic kidney disease 02/05/2018   Hyperlipidemia 09/24/2015   Diabetes mellitus with no complication (HCC) 03/16/2015    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   Arthritis of knee, degenerative 03/16/2015   Sinus infection 03/16/2015   DM (diabetes mellitus) (HCC)    Essential hypertension    GERD (gastroesophageal reflux disease)    History of bilateral knee replacement    PCP:  Duanne Limerick, MD Pharmacy:   [facility] [address], [place] - [address] [place] [address] Phone: [phone] Fax: [phone]     Social Determinants of Health (SDOH) Social History: SDOH Screenings   Food Insecurity: No Food Insecurity (06/14/2023)  Housing: Low Risk  (06/14/2023)  Transportation Needs: No Transportation Needs (06/14/2023)  Utilities: Not At Risk (06/14/2023)  Alcohol Screen: Low Risk  (08/18/2021)  Depression (PHQ2-9): Low Risk  (05/19/2023)  Financial Resource Strain: Low Risk  (08/26/2022)  Physical Activity: Inactive (08/18/2021)  Social Connections: Moderately Isolated (08/26/2022)  Stress: No Stress Concern Present (08/26/2022)  Tobacco Use: Low Risk  (07/27/2023)   SDOH Interventions:     Readmission Risk Interventions     No data to display

## 2023-07-31 DIAGNOSIS — S72401A Unspecified fracture of lower end of right femur, initial encounter for closed fracture: Secondary | ICD-10-CM | POA: Diagnosis not present

## 2023-07-31 LAB — CBC
HCT: 26.2 % — ABNORMAL LOW (ref 36.0–46.0)
Hemoglobin: 8.3 g/dL — ABNORMAL LOW (ref 12.0–15.0)
MCH: 27 pg (ref 26.0–34.0)
MCHC: 31.7 g/dL (ref 30.0–36.0)
MCV: 85.3 fL (ref 80.0–100.0)
Platelets: 218 K/uL (ref 150–400)
RBC: 3.07 MIL/uL — ABNORMAL LOW (ref 3.87–5.11)
RDW: 15.8 % — ABNORMAL HIGH (ref 11.5–15.5)
WBC: 7.8 K/uL (ref 4.0–10.5)
nRBC: 0 % (ref 0.0–0.2)

## 2023-07-31 LAB — URINE CULTURE: Culture: NO GROWTH

## 2023-07-31 LAB — BASIC METABOLIC PANEL
Anion gap: 5 (ref 5–15)
BUN: 16 mg/dL (ref 8–23)
CO2: 22 mmol/L (ref 22–32)
Calcium: 7.9 mg/dL — ABNORMAL LOW (ref 8.9–10.3)
Chloride: 108 mmol/L (ref 98–111)
Creatinine, Ser: 0.53 mg/dL (ref 0.44–1.00)
GFR, Estimated: >60 mL/min (ref >60)
Glucose, Bld: 204 mg/dL — ABNORMAL HIGH (ref 70–99)
Potassium: 4.1 mmol/L (ref 3.5–5.1)
Sodium: 135 mmol/L (ref 135–145)

## 2023-07-31 LAB — GLUCOSE, CAPILLARY
Glucose-Capillary: 120 mg/dL — ABNORMAL HIGH (ref 70–99)
Glucose-Capillary: 129 mg/dL — ABNORMAL HIGH (ref 70–99)

## 2023-07-31 NOTE — Plan of Care (Signed)

## 2023-07-31 NOTE — TOC Progression Note (Signed)
Transition of Care [facility]) - Progression Note    Patient Details  Name: [name] MRN: [id number] Date of Birth: 02/07/1932  Transition of Care [facility]) CM/SW Contact  Carley Hammed, LCSW Phone Number: 07/31/2023, 1:57 PM  Clinical Narrative:    CSW was advised by MD that pt's family would like [facility] and that she is medically ready for DC. [facility] had not responded, CSW requested they review pt, they are doing so. CSW spoke with dtr who confirmed interest in [facility] and requested an update when one is available. Pt will need an authorization prior to pt discharging. TOC will continue to follow for DC needs.    Expected Discharge Plan: Skilled Nursing Facility Barriers to Discharge: Continued Medical Work up  Expected Discharge Plan and Services In-house Referral: Clinical Social Work   Post Acute Care Choice: Skilled Nursing Facility Living arrangements for the past 2 months: Single Family Home                   DME Agency: NA                   Social Determinants of Health (SDOH) Interventions SDOH Screenings   Food Insecurity: No Food Insecurity (06/14/2023)  Housing: Low Risk  (06/14/2023)  Transportation Needs: No Transportation Needs (06/14/2023)  Utilities: Not At Risk (06/14/2023)  Alcohol Screen: Low Risk  (08/18/2021)  Depression (PHQ2-9): Low Risk  (05/19/2023)  Financial Resource Strain: Low Risk  (08/26/2022)  Physical Activity: Inactive (08/18/2021)  Social Connections: Moderately Isolated (08/26/2022)  Stress: No Stress Concern Present (08/26/2022)  Tobacco Use: Low Risk  (07/27/2023)    Readmission Risk Interventions     No data to display

## 2023-07-31 NOTE — Progress Notes (Signed)
[name]  NWG:[id number] DOB: [birth year] DOA: 07/26/2023 PCP: Duanne Limerick, MD   Brief Narrative: 89 year old with past medical history significant for GERD, hypertension, diabetes type 2 presented to the ED after mechanical fall, and fever evaluation x-ray left and right femur demonstrated bilateral femur fracture.   Assessment & Plan:   Principal Problem:   Femur fracture (HCC)  1-Mechanical fall, bilateral femur fracture -Underwent surgery on 9/5: Retrograde intramedullary nailing of right femur fracture, Retrograde intramedullary nailing of left femur fracture Pain management per ortho.  DVT prophylaxis per ortho Bowel regimen,. Continue  Miralax, senna.  Stable for rehab.   2-Acute Blood loss anemia: post sx, expected.  Iron deficiency anemia, Folic acid deficiency.  Started on Ferrous sulfate and folic acid  Received 2 units PRBC 9/06.  Hb increased to 8 post transfusion.  Hb stable.   UTI Fever; secondary to UTI UA with more than 50 WBC.  Start IV ceftriaxone.  Follow urine culture. No growth to date.  IV fluids  Left Shoulder pain: x ray negative for fracture, showed osteoarthritis.   Hypertension: Continue with amlodipine.  Agree with holding ARB  GERD: Continue with PPI  Glaucoma: Continue with eyedrops  Diabetes type 2: Check CBG and sliding scale insulin   Pressure Injury 06/13/23 Buttocks Right;Left Stage 2 -  Partial thickness loss of dermis presenting as a shallow open injury with a red, pink wound bed without slough. healing pressure injury. no drainage noted. pt states she sees wound care regularly. (Active)  06/13/23   Location: Buttocks  Location Orientation: Right;Left  Staging: Stage 2 -  Partial thickness loss of dermis presenting as a shallow open injury with a red, pink wound bed without slough.  Wound Description (Comments): healing pressure injury. no drainage noted. pt states she sees wound care regularly.   Present on Admission: Yes       Estimated body mass index is 28.87 kg/m as calculated from the following:   Height as of 06/28/23: 5\' 3"  (1.6 m).   Weight as of 06/28/23: 73.9 kg.   DVT prophylaxis: SCD Code Status: Full code Family Communication: Daughter at bedside 9/09 Disposition Plan:  Status is: Inpatient Remains inpatient appropriate because:. Awaiting placement. Medical stable for rehab.     Consultants:  ortho  Procedures:    Antimicrobials:    Subjective: She had BM. She has been eating. Pain controlled.   Objective: Vitals:   07/30/23 1740 07/30/23 2016 07/31/23 0526 07/31/23 0822  BP: 120/62 (!) 119/52 (!) 127/57 129/76  Pulse: 72 66 68 78  Resp: 17   17  Temp: 98.7 F (37.1 C) 98.3 F (36.8 C) 98.7 F (37.1 C)   TempSrc: Oral Oral Oral   SpO2: 95% 95% 93% 95%    Intake/Output Summary (Last 24 hours) at 07/31/2023 1307 Last data filed at 07/31/2023 1220 Gross per 24 hour  Intake 2043.32 ml  Output 1100 ml  Net 943.32 ml   There were no vitals filed for this visit.  Examination:  General exam: NAD Respiratory system: CTA Cardiovascular system: S 1, S 2 RRR Gastrointestinal system: BS present, soft, nt, nd Central nervous system: Alert/  Extremities: BL LE dressing BL knee     Data Reviewed: I have personally reviewed following labs and imaging studies  CBC: Recent Labs  Lab 07/26/23 1929 07/27/23 1622 07/28/23 0807 07/29/23 0447 07/29/23 2329 07/30/23 0855 07/31/23 1029  WBC 9.8   < > 9.8 11.0* 10.9* 10.1  7.8  NEUTROABS 7.8*  --   --   --  7.0  --   --   HGB 9.3*   < > 6.1* 8.4* 8.2* 8.5* 8.3*  HCT 29.3*   < > 19.0* 25.6* 25.2* 25.4* 26.2*  MCV 83.7   < > 86.8 86.2 86.0 87.3 85.3  PLT 310   < > 186 164 177 185 218   < > = values in this interval not displayed.   Basic Metabolic Panel: Recent Labs  Lab 07/26/23 1929 07/27/23 1622 07/28/23 0807 07/29/23 0447 07/29/23 2329 07/31/23 1029  NA 135  --  138 133* 137 135   K 4.1  --  4.1 3.7 4.2 4.1  CL 104  --  110 106 104 108  CO2 18*  --  21* 20* 23 22  GLUCOSE 160*  --  134* 117* 114* 204*  BUN 20  --  15 10 16 16   CREATININE 0.67 0.58 0.59 0.50 0.60 0.53  CALCIUM 8.9  --  7.7* 7.8* 8.4* 7.9*   GFR: CrCl cannot be calculated (Unknown ideal weight.). Liver Function Tests: Recent Labs  Lab 07/26/23 1929 07/29/23 2329  AST 15 19  ALT 13 14  ALKPHOS 73 65  BILITOT 0.6 0.9  PROT 7.3 5.4*  ALBUMIN 3.2* 2.1*   No results for input(s): "LIPASE", "AMYLASE" in the last 168 hours. No results for input(s): "AMMONIA" in the last 168 hours. Coagulation Profile: Recent Labs  Lab 07/26/23 1929  INR 1.1   Cardiac Enzymes: No results for input(s): "CKTOTAL", "CKMB", "CKMBINDEX", "TROPONINI" in the last 168 hours. BNP (last 3 results) No results for input(s): "PROBNP" in the last 8760 hours. HbA1C: No results for input(s): "HGBA1C" in the last 72 hours. CBG: Recent Labs  Lab 07/28/23 0809 07/28/23 1142 07/28/23 1707 07/29/23 0831 07/29/23 1148  GLUCAP 140* 163* 144* 120* 129*   Lipid Profile: No results for input(s): "CHOL", "HDL", "LDLCALC", "TRIG", "CHOLHDL", "LDLDIRECT" in the last 72 hours. Thyroid Function Tests: No results for input(s): "TSH", "T4TOTAL", "FREET4", "T3FREE", "THYROIDAB" in the last 72 hours. Anemia Panel: Recent Labs    07/30/23 0216  VITAMINB12 945*  FOLATE 5.0*  FERRITIN 141  TIBC 223*  IRON 16*  RETICCTPCT 1.5   Sepsis Labs: Recent Labs  Lab 07/29/23 2329 07/30/23 0127  PROCALCITON 0.13  --   LATICACIDVEN 1.1 0.9    Recent Results (from the past 240 hour(s))  Culture, blood (x 2)     Status: None (Preliminary result)   Collection Time: 07/29/23 11:25 PM   Specimen: BLOOD RIGHT ARM  Result Value Ref Range Status   Specimen Description BLOOD RIGHT ARM  Final   Special Requests   Final    BOTTLES DRAWN AEROBIC AND ANAEROBIC Blood Culture adequate volume   Culture   Final    NO GROWTH 2  DAYS Performed at [facility], [address] [place], [place] [address]    Report Status PENDING  Incomplete  Culture, blood (x 2)     Status: None (Preliminary result)   Collection Time: 07/29/23 11:26 PM   Specimen: BLOOD LEFT ARM  Result Value Ref Range Status   Specimen Description BLOOD LEFT ARM  Final   Special Requests   Final    BOTTLES DRAWN AEROBIC AND ANAEROBIC Blood Culture adequate volume   Culture   Final    NO GROWTH 2 DAYS Performed at [facility], [address] [place], [place] [address]  Report Status PENDING  Incomplete  Urine Culture (for pregnant, neutropenic or urologic patients or patients with an indwelling urinary catheter)     Status: None   Collection Time: 07/30/23 11:37 AM   Specimen: Urine, Clean Catch  Result Value Ref Range Status   Specimen Description URINE, CLEAN CATCH  Final   Special Requests NONE  Final   Culture   Final    NO GROWTH Performed at [facility], [address] [place], [place] [address]    Report Status 07/31/2023 FINAL  Final         Radiology Studies: No results found.      Scheduled Meds:  amLODipine  5 mg Oral Daily   docusate sodium  100 mg Oral BID   enoxaparin (LOVENOX) injection  40 mg Subcutaneous Q24H   feeding supplement  237 mL Oral TID BM   ferrous sulfate  325 mg Oral Q breakfast   folic acid  1 mg Oral Daily   glipiZIDE  2.5 mg Oral Q breakfast   latanoprost  1 drop Both Eyes QHS   pantoprazole  40 mg Oral Daily   polyethylene glycol  17 g Oral BID   senna  1 tablet Oral Daily   Continuous Infusions:  sodium chloride 100 mL/hr at 07/31/23 1258   cefTRIAXone (ROCEPHIN)  IV 1 g (07/31/23 1300)   methocarbamol (ROBAXIN) IV       LOS: 5 days    Time spent: 35 minutes    Kolston Lacount A Vetra Shinall, MD [facility]   If 7PM-7AM, please contact night-coverage [email or web address]  07/31/2023, 1:07 PM

## 2023-07-31 NOTE — Progress Notes (Signed)
Physical Therapy Treatment Patient Details Name: [name] MRN: [id number] DOB: 01-15-1932 Today's Date: 07/31/2023   History of Present Illness Pt is 89 yo presenting to [facility] ED after mechanical fall and fever. X-ray with findings of bil periprosthetic femur fractures. Currently s/p retrograde IMN bil on 07/27/23. Pt is WBAT bil. PMH: GERD, HTN, R knee DJD, DM II.    PT Comments  Pt is slowly progressing towards goals. Pt is limited by pain as well as anxieties over increased pain and falling. Pt was able to progress LE toward EOB today at Mod A but continues at a Total A for bed mobility. Pt was unable to clear the EOB for sit to stand with 2 person Max A and stedy. Due to pt current functional status, home setup and available assistance at home recommending skilled physical therapy services < 3 hours/day on discharge from acute care hospital setting in order to decrease risk for falls, injury and re-hospitalization.    If plan is discharge home, recommend the following: Two people to help with walking and/or transfers;Direct supervision/assist for medications management;Help with stairs or ramp for entrance;Assist for transportation   Can travel by private vehicle     No  Equipment Recommendations  Wheelchair (measurements PT);Wheelchair cushion (measurements PT)       Precautions / Restrictions Precautions Precautions: Fall Restrictions Weight Bearing Restrictions: Yes RLE Weight Bearing: Weight bearing as tolerated LLE Weight Bearing: Weight bearing as tolerated     Mobility  Bed Mobility Overal bed mobility: Needs Assistance Bed Mobility: Supine to Sit, Sit to Supine     Supine to sit: Total assist, +2 for physical assistance, Used rails, HOB elevated Sit to supine: +2 for physical assistance, Total assist   General bed mobility comments: Total assist with max assist to support Bil LE to control knee flexion as she moved EOB. Helicopter pivot with bed pad and +2 assist to  raise trunk. Max A initially, progressing to SBA sitting EOB with bil UE support. Intermittent active resistance with mobility. initially pt was moving LE toward EOB but had significantly difficulty rotating trunk to use bed rail to assist.    Transfers Overall transfer level: Needs assistance   Transfers: Sit to/from Stand Sit to Stand: Total assist, +2 safety/equipment, +2 physical assistance           General transfer comment: attempted to use stedy to stand EOB. Pt was unable to clear hips from EOB with 3 attempts. First 2 attempts pt was actively pushing back, 3rd attempt after education and encouraagement pt attempted to pull up but was unable to fully clear hips though she did off load and WB slightly through bil LE. Transfer via Lift Equipment: Stedy  Ambulation/Gait               General Gait Details: Pt is unable at this time.     Balance Overall balance assessment: Needs assistance Sitting-balance support: Feet supported Sitting balance-Leahy Scale: Poor Sitting balance - Comments: initial heavy posterior lean that improves with time in sitting       Standing balance comment: unable at this time        Cognition Arousal: Alert Behavior During Therapy: [facility] for tasks assessed/performed Overall Cognitive Status: Within Functional Limits for tasks assessed     General Comments: pt pleasant alert and oriented to situation. family present and no indication of change in cognitive status.           General Comments General comments (skin integrity,  edema, etc.): Family present throughout session. Pt was pre-medicated by RN      Pertinent Vitals/Pain Pain Assessment Pain Assessment: Faces Faces Pain Scale: Hurts whole lot Pain Location: Bilat LEs Pain Descriptors / Indicators: Aching, Grimacing, Guarding, Sore Pain Intervention(s): Limited activity within patient's tolerance, Monitored during session, Repositioned     PT Goals (current goals can now  be found in the care plan section) Acute Rehab PT Goals Patient Stated Goal: not fall again PT Goal Formulation: With patient Time For Goal Achievement: 08/12/23 Potential to Achieve Goals: Good Progress towards PT goals: Progressing toward goals    Frequency    Min 1X/week      PT Plan  Continue with current POC       AM-PAC PT "6 Clicks" Mobility   Outcome Measure  Help needed turning from your back to your side while in a flat bed without using bedrails?: Total Help needed moving from lying on your back to sitting on the side of a flat bed without using bedrails?: Total Help needed moving to and from a bed to a chair (including a wheelchair)?: Total Help needed standing up from a chair using your arms (e.g., wheelchair or bedside chair)?: Total Help needed to walk in hospital room?: Total Help needed climbing 3-5 steps with a railing? : Total 6 Click Score: 6    End of Session Equipment Utilized During Treatment: Gait belt Activity Tolerance: Patient tolerated treatment well;Patient limited by pain Patient left: in bed;with call bell/phone within reach;with bed alarm set;with family/visitor present Nurse Communication: Mobility status PT Visit Diagnosis: Other abnormalities of gait and mobility (R26.89);Muscle weakness (generalized) (M62.81);Difficulty in walking, not elsewhere classified (R26.2);Unsteadiness on feet (R26.81)     Time: 1610-9604 PT Time Calculation (min) (ACUTE ONLY): 28 min  Charges:    $Therapeutic Activity: 23-37 mins PT General Charges $$ ACUTE PT VISIT: 1 Visit                    Harrel Carina, DPT, CLT  Acute Rehabilitation Services Office: [phone] (Secure chat preferred)    Claudia Desanctis 07/31/2023, 2:58 PM

## 2023-08-01 DIAGNOSIS — S72401A Unspecified fracture of lower end of right femur, initial encounter for closed fracture: Secondary | ICD-10-CM | POA: Diagnosis not present

## 2023-08-01 DIAGNOSIS — M6281 Muscle weakness (generalized): Secondary | ICD-10-CM | POA: Diagnosis not present

## 2023-08-01 DIAGNOSIS — D649 Anemia, unspecified: Secondary | ICD-10-CM | POA: Diagnosis not present

## 2023-08-01 DIAGNOSIS — M9711XD Periprosthetic fracture around internal prosthetic right knee joint, subsequent encounter: Secondary | ICD-10-CM | POA: Diagnosis not present

## 2023-08-01 DIAGNOSIS — R278 Other lack of coordination: Secondary | ICD-10-CM | POA: Diagnosis not present

## 2023-08-01 DIAGNOSIS — I10 Essential (primary) hypertension: Secondary | ICD-10-CM | POA: Diagnosis not present

## 2023-08-01 DIAGNOSIS — D500 Iron deficiency anemia secondary to blood loss (chronic): Secondary | ICD-10-CM | POA: Diagnosis not present

## 2023-08-01 DIAGNOSIS — E119 Type 2 diabetes mellitus without complications: Secondary | ICD-10-CM | POA: Diagnosis not present

## 2023-08-01 DIAGNOSIS — E875 Hyperkalemia: Secondary | ICD-10-CM | POA: Diagnosis not present

## 2023-08-01 DIAGNOSIS — M6259 Muscle wasting and atrophy, not elsewhere classified, multiple sites: Secondary | ICD-10-CM | POA: Diagnosis not present

## 2023-08-01 DIAGNOSIS — D62 Acute posthemorrhagic anemia: Secondary | ICD-10-CM | POA: Diagnosis not present

## 2023-08-01 DIAGNOSIS — R2689 Other abnormalities of gait and mobility: Secondary | ICD-10-CM | POA: Diagnosis not present

## 2023-08-01 DIAGNOSIS — K5909 Other constipation: Secondary | ICD-10-CM | POA: Diagnosis not present

## 2023-08-01 DIAGNOSIS — S72092A Other fracture of head and neck of left femur, initial encounter for closed fracture: Secondary | ICD-10-CM | POA: Diagnosis not present

## 2023-08-01 DIAGNOSIS — E876 Hypokalemia: Secondary | ICD-10-CM | POA: Diagnosis not present

## 2023-08-01 DIAGNOSIS — R296 Repeated falls: Secondary | ICD-10-CM | POA: Diagnosis not present

## 2023-08-01 DIAGNOSIS — M9712XD Periprosthetic fracture around internal prosthetic left knee joint, subsequent encounter: Secondary | ICD-10-CM | POA: Diagnosis not present

## 2023-08-01 DIAGNOSIS — N390 Urinary tract infection, site not specified: Secondary | ICD-10-CM | POA: Diagnosis not present

## 2023-08-01 DIAGNOSIS — Z9181 History of falling: Secondary | ICD-10-CM | POA: Diagnosis not present

## 2023-08-01 DIAGNOSIS — S72091A Other fracture of head and neck of right femur, initial encounter for closed fracture: Secondary | ICD-10-CM | POA: Diagnosis not present

## 2023-08-01 MED ORDER — AMLODIPINE BESYLATE 5 MG PO TABS: 5.0000 mg | ORAL_TABLET | Freq: Every day | ORAL | 0 refills | Status: DC

## 2023-08-01 MED ORDER — CEPHALEXIN 500 MG PO CAPS: 500.0000 mg | ORAL_CAPSULE | Freq: Three times a day (TID) | ORAL | Status: DC

## 2023-08-01 MED ORDER — POLYETHYLENE GLYCOL 3350 17 G PO PACK: 17.0000 g | PACK | Freq: Two times a day (BID) | ORAL | 0 refills | Status: AC

## 2023-08-01 MED ORDER — SENNA 8.6 MG PO TABS: 1.0000 | ORAL_TABLET | Freq: Every day | ORAL | 0 refills | Status: AC

## 2023-08-01 MED ORDER — CEPHALEXIN 500 MG PO CAPS: 500.0000 mg | ORAL_CAPSULE | Freq: Three times a day (TID) | ORAL | 0 refills | Status: AC

## 2023-08-01 MED ORDER — FERROUS SULFATE 325 (65 FE) MG PO TABS: 325.0000 mg | ORAL_TABLET | Freq: Every day | ORAL | 3 refills | Status: AC

## 2023-08-01 MED ORDER — FOLIC ACID 1 MG PO TABS: 1.0000 mg | ORAL_TABLET | Freq: Every day | ORAL | 0 refills | Status: AC

## 2023-08-01 NOTE — Discharge Summary (Addendum)
Physician Discharge Summary   Patient: [name] MRN: [id number] DOB: 02-01-1932  Admit date:     07/26/2023  Discharge date: 08/01/23  Discharge Physician: Alba Cory   PCP: Duanne Limerick, MD   Recommendations at discharge:    Eliquis for 1 month for DVT prophylaxis.  Monitor hb.  Needs to follow up with Ortho Dr Jena Gauss in 2 weeks post discharge.   Discharge Diagnoses: Principal Problem:   Femur fracture (HCC)  Resolved Problems:   * No resolved hospital problems. *  Hospital Course: 89 year old with past medical history significant for GERD, hypertension, diabetes type 2 presented to the ED after mechanical fall, and fever evaluation x-ray left and right femur demonstrated bilateral femur fracture.    Assessment and Plan: 1-Mechanical fall, bilateral femur fracture -Underwent surgery on 9/5: Retrograde intramedullary nailing of right femur fracture, Retrograde intramedullary nailing of left femur fracture Pain management per ortho.  DVT prophylaxis per ortho Bowel regimen,. Continue  Miralax, senna.  Stable for rehab.    2-Acute Blood loss anemia: post sx, expected. Of clinical significance.  Iron deficiency anemia, Folic acid deficiency.  Started on Ferrous sulfate and folic acid  Received 2 units PRBC 9/06.  Hb increased to 8 post transfusion.  Hb stable.    UTI Fever; secondary to UTI UA with more than 50 WBC.  Received 3 days of IV ceftriaxone. Transition to Keflex for 2 days  Follow urine culture. No growth to date.  IV fluids   Left Shoulder pain: x ray negative for fracture, showed osteoarthritis.    Hypertension: Continue with amlodipine. holding ARB due to soft BP   GERD: Continue with PPI   Glaucoma: Continue with eyedrops   Diabetes type 2: resume home meds.      Pressure Injury 06/13/23 Buttocks Right;Left Stage 2 -  Partial thickness loss of dermis presenting as a shallow open injury with a red, pink wound bed without slough.  healing pressure injury. no drainage noted. pt states she sees wound care regularly. (Active)  06/13/23   Location: Buttocks  Location Orientation: Right;Left  Staging: Stage 2 -  Partial thickness loss of dermis presenting as a shallow open injury with a red, pink wound bed without slough.  Wound Description (Comments): healing pressure injury. no drainage noted. pt states she sees wound care regularly.  Present on Admission: Yes               Consultants: Ortho Procedures performed: none Disposition: Home Diet recommendation:  Discharge Diet Orders (From admission, onward)     Start     Ordered   08/01/23 0000  Diet - low sodium heart healthy        08/01/23 1310           Cardiac diet DISCHARGE MEDICATION: Allergies as of 08/01/2023       Reactions   Metoprolol Other (See Comments)   "Metoprolol had to be discontinued due to symptomatic bradycardia," per notes on 06/28/2023   Nsaids Other (See Comments)   Uncertain if this truly is an issue   Lisinopril Cough        Medication List     STOP taking these medications    furosemide 20 MG tablet Commonly known as: LASIX   irbesartan 150 MG tablet Commonly known as: AVAPRO   loratadine 10 MG tablet Commonly known as: CLARITIN       TAKE these medications    amLODipine 5 MG tablet Commonly known as: NORVASC Take  1 tablet (5 mg total) by mouth daily. Start taking on: August 02, 2023 What changed:  medication strength how much to take   apixaban 2.5 MG Tabs tablet Commonly known as: Eliquis Take 1 tablet (2.5 mg total) by mouth 2 (two) times daily.   cephALEXin 500 MG capsule Commonly known as: KEFLEX Take 1 capsule (500 mg total) by mouth every 8 (eight) hours for 2 days.   cyanocobalamin 100 MCG tablet Take 100 mcg by mouth daily.   docusate sodium 50 MG capsule Commonly known as: COLACE Take 1 capsule (50 mg total) by mouth 2 (two) times daily.   ferrous sulfate 325 (65 FE) MG  tablet Take 1 tablet (325 mg total) by mouth daily with breakfast. Start taking on: August 02, 2023   fluocinonide 0.05 % external solution Commonly known as: LIDEX Apply 1 Application topically See admin instructions. Apply to bilateral legs once a day   folic acid 1 MG tablet Commonly known as: FOLVITE Take 1 tablet (1 mg total) by mouth daily. Start taking on: August 02, 2023   glipiZIDE 2.5 MG 24 hr tablet Commonly known as: GLUCOTROL XL Take 1 tablet (2.5 mg total) by mouth daily with breakfast.   glucose blood test strip Commonly known as: ONE TOUCH ULTRA TEST USE 1 STRIP TO CHECK GLUCOSE ONCE DAILY   latanoprost 0.005 % ophthalmic solution Commonly known as: XALATAN Place 1 drop into both eyes at bedtime.   metFORMIN 500 MG tablet Commonly known as: GLUCOPHAGE Take 1 tablet (500 mg total) by mouth 2 (two) times daily with a meal.   methocarbamol 500 MG tablet Commonly known as: ROBAXIN Take 1 tablet (500 mg total) by mouth every 6 (six) hours as needed for muscle spasms.   mometasone 50 MCG/ACT nasal spray Commonly known as: NASONEX Use 2 spray(s) in each nostril once daily What changed:  how much to take how to take this when to take this reasons to take this additional instructions   montelukast 10 MG tablet Commonly known as: SINGULAIR Take 1 tablet (10 mg total) by mouth at bedtime.   omeprazole 20 MG capsule Commonly known as: PRILOSEC Take 1 capsule (20 mg total) by mouth daily. What changed: when to take this   oxyCODONE 5 MG immediate release tablet Commonly known as: Oxy IR/ROXICODONE Take 1 tablet (5 mg total) by mouth every 4 (four) hours as needed for moderate pain or severe pain.   polyethylene glycol 17 g packet Commonly known as: MIRALAX / GLYCOLAX Take 17 g by mouth 2 (two) times daily.   senna 8.6 MG Tabs tablet Commonly known as: SENOKOT Take 1 tablet (8.6 mg total) by mouth daily. Start taking on: August 02, 2023    TYLENOL 500 MG tablet Generic drug: acetaminophen Take 500-1,000 mg by mouth every 6 (six) hours as needed (for pain).               Discharge Care Instructions  (From admission, onward)           Start     Ordered   08/01/23 0000  Discharge wound care:       Comments: See above   08/01/23 1310            Contact information for follow-up providers     Haddix, Gillie Manners, MD. Schedule an appointment as soon as possible for a visit in 2 week(s).   Specialty: Orthopedic Surgery Why: For wound check and repeat x-rays Contact information: [address] [place] [address] [phone]              Contact information for after-discharge care     Destination     [facility]erred [facility] .   Service: Skilled Nursing Contact information: [address] [place] [address] [phone]                    Discharge Exam: There were no vitals filed for this visit. General; NAD  Condition at discharge: stable  The results of significant diagnostics from this hospitalization (including imaging, microbiology, ancillary and laboratory) are listed below for reference.   Imaging Studies: DG Shoulder Left  Result Date: 07/28/2023 CLINICAL DATA:  Severe left shoulder pain EXAM: LEFT SHOULDER - 2+ VIEW COMPARISON:  02/09/2023 FINDINGS: AC joint appears intact. No acute fracture or malalignment. Severe arthritis of the glenohumeral joint with bone on bone appearance, subarticular sclerosis and lucency and inferior and superior osteophytosis. IMPRESSION: Severe arthritis of the glenohumeral joint. Electronically Signed   By: Jasmine Pang M.D.   On: 07/28/2023 20:26   DG FEMUR MIN 2 VIEWS LEFT  Result Date: 07/27/2023 CLINICAL DATA:  Elective surgery. EXAM: LEFT FEMUR 2 VIEWS COMPARISON:  Preoperative imaging. FINDINGS: Ten fluoroscopic spot views of the femur obtained in the operating room. Sequential  images during femoral intramedullary nail with proximal and distal locking screw fixation traversing distal femur fracture. Fluoroscopy time 90.45 seconds. Dose 9.57 mGy. IMPRESSION: Intraoperative fluoroscopy during femoral fracture fixation. Electronically Signed   By: Narda Rutherford M.D.   On: 07/27/2023 15:33   DG FEMUR, MIN 2 VIEWS RIGHT  Result Date: 07/27/2023 CLINICAL DATA:  Elective surgery. EXAM: RIGHT FEMUR 2 VIEWS COMPARISON:  Preoperative imaging. FINDINGS: Four fluoroscopic spot views of the femur obtained in the operating room. Femoral intramedullary nail with proximal and distal locking screw fixation. Fluoroscopy time 1:30 2.55 seconds. Dose 9.57 mGy. IMPRESSION: Intraoperative fluoroscopy during femoral fracture fixation. Electronically Signed   By: Narda Rutherford M.D.   On: 07/27/2023 15:32   DG FEMUR PORT MIN 2 VIEWS LEFT  Result Date: 07/27/2023 CLINICAL DATA:  Femur fracture, postop. EXAM: LEFT FEMUR PORTABLE 2 VIEWS COMPARISON:  Preoperative imaging. FINDINGS: Femoral intramedullary nail with proximal and distal locking screw fixation traverse distal femoral shaft fracture. Improved fracture alignment from preoperative imaging. Prior knee replacement. Recent postsurgical change includes air and edema in the soft tissues. IMPRESSION: ORIF of distal femoral shaft fracture. No immediate postoperative complication. Electronically Signed   By: Narda Rutherford M.D.   On: 07/27/2023 15:31   DG FEMUR PORT, MIN 2 VIEWS RIGHT  Result Date: 07/27/2023 CLINICAL DATA:  Femur fracture, postop IM nail. EXAM: RIGHT FEMUR PORTABLE 2 VIEW COMPARISON:  Preoperative radiograph FINDINGS: Femoral intramedullary nail with proximal and distal locking screw fixation traverses femoral shaft fracture. Improved fracture alignment from preoperative imaging. There is been prior knee arthroplasty. Recent postsurgical change includes air and edema in the soft tissues. IMPRESSION: ORIF of femoral shaft fracture.  No immediate postoperative complication. Electronically Signed   By: Narda Rutherford M.D.   On: 07/27/2023 15:30   DG C-Arm 1-60 Min-No Report  Result Date: 07/27/2023 Fluoroscopy was utilized by the requesting physician.  No radiographic interpretation.   DG C-Arm 1-60 Min-No Report  Result Date: 07/27/2023 Fluoroscopy was utilized by the requesting physician.  No radiographic interpretation.   CT HEAD WO CONTRAST  Result Date: 07/26/2023 CLINICAL DATA:  Polytrauma, blunt.  Patient fell.  EXAM: CT HEAD WITHOUT CONTRAST CT CERVICAL SPINE WITHOUT CONTRAST TECHNIQUE: Multidetector CT imaging of the head and cervical spine was performed following the standard protocol without intravenous contrast. Multiplanar CT image reconstructions of the cervical spine were also generated. RADIATION DOSE REDUCTION: This exam was performed according to the departmental dose-optimization program which includes automated exposure control, adjustment of the mA and/or kV according to patient size and/or use of iterative reconstruction technique. COMPARISON:  CT scan head from 06/12/2023 and CT scan cervical spine from 12/27/2010. FINDINGS: CT HEAD FINDINGS Brain: No evidence of acute infarction, hemorrhage, hydrocephalus, extra-axial collection or mass lesion/mass effect. Vascular: No hyperdense vessel or unexpected calcification. Skull: Normal. Negative for fracture or focal lesion. Sinuses/Orbits: No acute finding. Other: None. CT CERVICAL SPINE FINDINGS Alignment: Normal. This examination does not assess for ligamentous injury or stability. Note is made of C3-4 ACDF. Skull base and vertebrae: No acute fracture. No primary bone lesion or focal pathologic process. Soft tissues and spinal canal: No prevertebral fluid or swelling. No visible canal hematoma. Disc levels: There are moderate multilevel degenerative changes of the cervical spine characterized by reduced intervertebral disc height, facet arthropathy and marginal  osteophyte formation, most pronounced at C4-5 level. Upper chest: Negative. Other: None. IMPRESSION: 1. No acute intracranial abnormality. 2. No acute fracture or traumatic malalignment of the cervical spine. 3. Moderate multilevel degenerative changes of the cervical spine. Electronically Signed   By: Jules Schick M.D.   On: 07/26/2023 20:43   CT CERVICAL SPINE WO CONTRAST  Result Date: 07/26/2023 CLINICAL DATA:  Polytrauma, blunt.  Patient fell. EXAM: CT HEAD WITHOUT CONTRAST CT CERVICAL SPINE WITHOUT CONTRAST TECHNIQUE: Multidetector CT imaging of the head and cervical spine was performed following the standard protocol without intravenous contrast. Multiplanar CT image reconstructions of the cervical spine were also generated. RADIATION DOSE REDUCTION: This exam was performed according to the departmental dose-optimization program which includes automated exposure control, adjustment of the mA and/or kV according to patient size and/or use of iterative reconstruction technique. COMPARISON:  CT scan head from 06/12/2023 and CT scan cervical spine from 12/27/2010. FINDINGS: CT HEAD FINDINGS Brain: No evidence of acute infarction, hemorrhage, hydrocephalus, extra-axial collection or mass lesion/mass effect. Vascular: No hyperdense vessel or unexpected calcification. Skull: Normal. Negative for fracture or focal lesion. Sinuses/Orbits: No acute finding. Other: None. CT CERVICAL SPINE FINDINGS Alignment: Normal. This examination does not assess for ligamentous injury or stability. Note is made of C3-4 ACDF. Skull base and vertebrae: No acute fracture. No primary bone lesion or focal pathologic process. Soft tissues and spinal canal: No prevertebral fluid or swelling. No visible canal hematoma. Disc levels: There are moderate multilevel degenerative changes of the cervical spine characterized by reduced intervertebral disc height, facet arthropathy and marginal osteophyte formation, most pronounced at C4-5 level.  Upper chest: Negative. Other: None. IMPRESSION: 1. No acute intracranial abnormality. 2. No acute fracture or traumatic malalignment of the cervical spine. 3. Moderate multilevel degenerative changes of the cervical spine. Electronically Signed   By: Jules Schick M.D.   On: 07/26/2023 20:43   DG FEMUR MIN 2 VIEWS LEFT  Result Date: 07/26/2023 CLINICAL DATA:  Fall. EXAM: RIGHT FEMUR 2 VIEWS; LEFT FEMUR 2 VIEWS COMPARISON:  None Available. FINDINGS: Examination is limited due to patient related factors. There is comminuted fracture of the distal half of the right femur. There is approximately 1 shaft width displacement. There is also angulated and comminuted fracture of the distal half of the left femur which probably reaches  up to the knee transplant femoral prosthesis. Bilateral knee arthroplasty noted. No aggressive osseous lesion. No focal soft tissue swelling. No radiopaque foreign bodies. IMPRESSION: Bilateral femur fractures. Electronically Signed   By: Jules Schick M.D.   On: 07/26/2023 20:26   DG FEMUR, MIN 2 VIEWS RIGHT  Result Date: 07/26/2023 CLINICAL DATA:  Fall. EXAM: RIGHT FEMUR 2 VIEWS; LEFT FEMUR 2 VIEWS COMPARISON:  None Available. FINDINGS: Examination is limited due to patient related factors. There is comminuted fracture of the distal half of the right femur. There is approximately 1 shaft width displacement. There is also angulated and comminuted fracture of the distal half of the left femur which probably reaches up to the knee transplant femoral prosthesis. Bilateral knee arthroplasty noted. No aggressive osseous lesion. No focal soft tissue swelling. No radiopaque foreign bodies. IMPRESSION: Bilateral femur fractures. Electronically Signed   By: Jules Schick M.D.   On: 07/26/2023 20:26   DG Pelvis 1-2 Views  Result Date: 07/26/2023 CLINICAL DATA:  [id number] Fall [id number] EXAM: PELVIS - 1-2 VIEW COMPARISON:  None Available. FINDINGS: Examination is markedly limited due to patient  related factors. No acute displaced fracture seen. There are changes of chronic pubic symphisitis. There are moderate to severe degenerative changes of bilateral hip joints in the form of reduced joint space, subchondral sclerosis / cystic changes and osteophytosis. No radiopaque foreign bodies. IMPRESSION: *Markedly limited exam. No acute displaced fracture. If there is high clinical concern for fracture, cross-sectional imaging is recommended. Electronically Signed   By: Jules Schick M.D.   On: 07/26/2023 20:23   DG Chest 1 View  Result Date: 07/26/2023 CLINICAL DATA:  fall. EXAM: CHEST  1 VIEW COMPARISON:  11/28/2022. FINDINGS: Bilateral lung fields are clear. Bilateral lateral costophrenic angles are clear. Stable cardio-mediastinal silhouette. Aortic arch calcifications noted. No acute osseous abnormalities. The soft tissues are within normal limits. IMPRESSION: *No acute cardiopulmonary process. Electronically Signed   By: Jules Schick M.D.   On: 07/26/2023 20:20    Microbiology: Results for orders placed or performed during the hospital encounter of 07/26/23  Culture, blood (x 2)     Status: None (Preliminary result)   Collection Time: 07/29/23 11:25 PM   Specimen: BLOOD RIGHT ARM  Result Value Ref Range Status   Specimen Description BLOOD RIGHT ARM  Final   Special Requests   Final    BOTTLES DRAWN AEROBIC AND ANAEROBIC Blood Culture adequate volume   Culture   Final    NO GROWTH 3 DAYS Performed at [facility], [address] [place], [place] [address]    Report Status PENDING  Incomplete  Culture, blood (x 2)     Status: None (Preliminary result)   Collection Time: 07/29/23 11:26 PM   Specimen: BLOOD LEFT ARM  Result Value Ref Range Status   Specimen Description BLOOD LEFT ARM  Final   Special Requests   Final    BOTTLES DRAWN AEROBIC AND ANAEROBIC Blood Culture adequate volume   Culture   Final    NO GROWTH 3 DAYS Performed at [facility], [address] [place], [place] [address]    Report Status PENDING  Incomplete  Urine Culture (for pregnant, neutropenic or urologic patients or patients with an indwelling urinary catheter)     Status: None   Collection Time: 07/30/23 11:37 AM   Specimen: Urine, Clean Catch  Result Value Ref Range Status   Specimen Description URINE, CLEAN CATCH  Final   Special Requests  NONE  Final   Culture   Final    NO GROWTH Performed at [facility], [address] [place], [place] [address]    Report Status 07/31/2023 FINAL  Final    Labs: CBC: Recent Labs  Lab 07/26/23 1929 07/27/23 1622 07/28/23 0807 07/29/23 0447 07/29/23 2329 07/30/23 0855 07/31/23 1029  WBC 9.8   < > 9.8 11.0* 10.9* 10.1 7.8  NEUTROABS 7.8*  --   --   --  7.0  --   --   HGB 9.3*   < > 6.1* 8.4* 8.2* 8.5* 8.3*  HCT 29.3*   < > 19.0* 25.6* 25.2* 25.4* 26.2*  MCV 83.7   < > 86.8 86.2 86.0 87.3 85.3  PLT 310   < > 186 164 177 185 218   < > = values in this interval not displayed.   Basic Metabolic Panel: Recent Labs  Lab 07/26/23 1929 07/27/23 1622 07/28/23 0807 07/29/23 0447 07/29/23 2329 07/31/23 1029  NA 135  --  138 133* 137 135  K 4.1  --  4.1 3.7 4.2 4.1  CL 104  --  110 106 104 108  CO2 18*  --  21* 20* 23 22  GLUCOSE 160*  --  134* 117* 114* 204*  BUN 20  --  15 10 16 16   CREATININE 0.67 0.58 0.59 0.50 0.60 0.53  CALCIUM 8.9  --  7.7* 7.8* 8.4* 7.9*   Liver Function Tests: Recent Labs  Lab 07/26/23 1929 07/29/23 2329  AST 15 19  ALT 13 14  ALKPHOS 73 65  BILITOT 0.6 0.9  PROT 7.3 5.4*  ALBUMIN 3.2* 2.1*   CBG: Recent Labs  Lab 07/28/23 0809 07/28/23 1142 07/28/23 1707 07/29/23 0831 07/29/23 1148  GLUCAP 140* 163* 144* 120* 129*    Discharge time spent: greater than 30 minutes.  Signed: Alba Cory, MD [facility] 08/01/2023

## 2023-08-01 NOTE — TOC Transition Note (Signed)
Transition of Care [facility]) - CM/SW Discharge Note   Patient Details  Name: [name] MRN: [id number] Date of Birth: 26-Dec-1931  [facility] CM/SW Contact:  Carley Hammed, LCSW Phone Number: 08/01/2023, 2:08 PM   Clinical Narrative:    Pt to be transported to [facility] via [place]. Nurse to call report to [phone].   Final next level of care: Skilled Nursing Facility Barriers to Discharge: Barriers Resolved   Patient Goals and CMS Choice CMS Medicare.gov Compare Post Acute Care list provided to:: Patient Represenative (must comment) (daughter) Choice offered to / list presented to : Patient, Adult Children  Discharge Placement                Patient chooses bed at: [facility] Patient to be transferred to facility by: [facility] Name of family member notified: [name] Patient and family notified of of transfer: 08/01/23  Discharge Plan and Services Additional resources added to the After Visit Summary for   In-house Referral: Clinical Social Work   Post Acute Care Choice: Skilled Nursing Facility            DME Agency: NA                  Social Determinants of Health (SDOH) Interventions SDOH Screenings   Food Insecurity: No Food Insecurity (06/14/2023)  Housing: Low Risk  (06/14/2023)  Transportation Needs: No Transportation Needs (06/14/2023)  Utilities: Not At Risk (06/14/2023)  Alcohol Screen: Low Risk  (08/18/2021)  Depression (PHQ2-9): Low Risk  (05/19/2023)  Financial Resource Strain: Low Risk  (08/26/2022)  Physical Activity: Inactive (08/18/2021)  Social Connections: Moderately Isolated (08/26/2022)  Stress: No Stress Concern Present (08/26/2022)  Tobacco Use: Low Risk  (07/27/2023)     Readmission Risk Interventions     No data to display

## 2023-08-01 NOTE — TOC Progression Note (Signed)
Transition of Care [facility]) - Progression Note    Patient Details  Name: [name] MRN: [id number] Date of Birth: 1932-10-27  Transition [facility]) CM/SW Contact  Carley Hammed, LCSW Phone Number: 08/01/2023, 12:20 PM  Clinical Narrative:    CSW spoke with pt's family who chose [facility], [facility] has now offered a bed. Facility notified and auth pending at this time. TOC will continue to follow for DC needs.    Expected Discharge Plan: Skilled Nursing Facility Barriers to Discharge: Continued Medical Work up  Expected Discharge Plan and Services In-house Referral: Clinical Social Work   Post Acute Care Choice: Skilled Nursing Facility Living arrangements for the past 2 months: Single Family Home                   DME Agency: NA                   Social Determinants of Health (SDOH) Interventions SDOH Screenings   Food Insecurity: No Food Insecurity (06/14/2023)  Housing: Low Risk  (06/14/2023)  Transportation Needs: No Transportation Needs (06/14/2023)  Utilities: Not At Risk (06/14/2023)  Alcohol Screen: Low Risk  (08/18/2021)  Depression (PHQ2-9): Low Risk  (05/19/2023)  Financial Resource Strain: Low Risk  (08/26/2022)  Physical Activity: Inactive (08/18/2021)  Social Connections: Moderately Isolated (08/26/2022)  Stress: No Stress Concern Present (08/26/2022)  Tobacco Use: Low Risk  (07/27/2023)    Readmission Risk Interventions     No data to display

## 2023-08-02 DIAGNOSIS — M6281 Muscle weakness (generalized): Secondary | ICD-10-CM | POA: Diagnosis not present

## 2023-08-02 DIAGNOSIS — N390 Urinary tract infection, site not specified: Secondary | ICD-10-CM | POA: Diagnosis not present

## 2023-08-02 DIAGNOSIS — D500 Iron deficiency anemia secondary to blood loss (chronic): Secondary | ICD-10-CM | POA: Diagnosis not present

## 2023-08-02 DIAGNOSIS — I10 Essential (primary) hypertension: Secondary | ICD-10-CM | POA: Diagnosis not present

## 2023-08-02 DIAGNOSIS — S72092A Other fracture of head and neck of left femur, initial encounter for closed fracture: Secondary | ICD-10-CM | POA: Diagnosis not present

## 2023-08-02 DIAGNOSIS — E119 Type 2 diabetes mellitus without complications: Secondary | ICD-10-CM | POA: Diagnosis not present

## 2023-08-02 DIAGNOSIS — S72091A Other fracture of head and neck of right femur, initial encounter for closed fracture: Secondary | ICD-10-CM | POA: Diagnosis not present

## 2023-08-02 DIAGNOSIS — R296 Repeated falls: Secondary | ICD-10-CM | POA: Diagnosis not present

## 2023-08-03 LAB — CULTURE, BLOOD (ROUTINE X 2)
Culture: NO GROWTH
Culture: NO GROWTH
Special Requests: ADEQUATE
Special Requests: ADEQUATE

## 2023-08-04 DIAGNOSIS — I10 Essential (primary) hypertension: Secondary | ICD-10-CM | POA: Diagnosis not present

## 2023-08-04 DIAGNOSIS — S72092A Other fracture of head and neck of left femur, initial encounter for closed fracture: Secondary | ICD-10-CM | POA: Diagnosis not present

## 2023-08-04 DIAGNOSIS — M9712XD Periprosthetic fracture around internal prosthetic left knee joint, subsequent encounter: Secondary | ICD-10-CM | POA: Diagnosis not present

## 2023-08-04 DIAGNOSIS — D500 Iron deficiency anemia secondary to blood loss (chronic): Secondary | ICD-10-CM | POA: Diagnosis not present

## 2023-08-04 DIAGNOSIS — Z9181 History of falling: Secondary | ICD-10-CM | POA: Diagnosis not present

## 2023-08-04 DIAGNOSIS — R278 Other lack of coordination: Secondary | ICD-10-CM | POA: Diagnosis not present

## 2023-08-04 DIAGNOSIS — M6281 Muscle weakness (generalized): Secondary | ICD-10-CM | POA: Diagnosis not present

## 2023-08-04 DIAGNOSIS — R2689 Other abnormalities of gait and mobility: Secondary | ICD-10-CM | POA: Diagnosis not present

## 2023-08-04 DIAGNOSIS — E119 Type 2 diabetes mellitus without complications: Secondary | ICD-10-CM | POA: Diagnosis not present

## 2023-08-04 DIAGNOSIS — R296 Repeated falls: Secondary | ICD-10-CM | POA: Diagnosis not present

## 2023-08-04 DIAGNOSIS — N390 Urinary tract infection, site not specified: Secondary | ICD-10-CM | POA: Diagnosis not present

## 2023-08-04 DIAGNOSIS — M9711XD Periprosthetic fracture around internal prosthetic right knee joint, subsequent encounter: Secondary | ICD-10-CM | POA: Diagnosis not present

## 2023-08-07 DIAGNOSIS — D500 Iron deficiency anemia secondary to blood loss (chronic): Secondary | ICD-10-CM | POA: Diagnosis not present

## 2023-08-07 DIAGNOSIS — S72092A Other fracture of head and neck of left femur, initial encounter for closed fracture: Secondary | ICD-10-CM | POA: Diagnosis not present

## 2023-08-07 DIAGNOSIS — N390 Urinary tract infection, site not specified: Secondary | ICD-10-CM | POA: Diagnosis not present

## 2023-08-07 DIAGNOSIS — R296 Repeated falls: Secondary | ICD-10-CM | POA: Diagnosis not present

## 2023-08-07 DIAGNOSIS — I10 Essential (primary) hypertension: Secondary | ICD-10-CM | POA: Diagnosis not present

## 2023-08-07 DIAGNOSIS — K5909 Other constipation: Secondary | ICD-10-CM | POA: Diagnosis not present

## 2023-08-07 DIAGNOSIS — M6281 Muscle weakness (generalized): Secondary | ICD-10-CM | POA: Diagnosis not present

## 2023-08-07 DIAGNOSIS — E119 Type 2 diabetes mellitus without complications: Secondary | ICD-10-CM | POA: Diagnosis not present

## 2023-08-08 DIAGNOSIS — S72092A Other fracture of head and neck of left femur, initial encounter for closed fracture: Secondary | ICD-10-CM | POA: Diagnosis not present

## 2023-08-08 DIAGNOSIS — D62 Acute posthemorrhagic anemia: Secondary | ICD-10-CM | POA: Diagnosis not present

## 2023-08-08 DIAGNOSIS — I10 Essential (primary) hypertension: Secondary | ICD-10-CM | POA: Diagnosis not present

## 2023-08-08 DIAGNOSIS — M6281 Muscle weakness (generalized): Secondary | ICD-10-CM | POA: Diagnosis not present

## 2023-08-08 DIAGNOSIS — R278 Other lack of coordination: Secondary | ICD-10-CM | POA: Diagnosis not present

## 2023-08-08 DIAGNOSIS — Z9181 History of falling: Secondary | ICD-10-CM | POA: Diagnosis not present

## 2023-08-08 DIAGNOSIS — R2689 Other abnormalities of gait and mobility: Secondary | ICD-10-CM | POA: Diagnosis not present

## 2023-08-08 DIAGNOSIS — M9712XD Periprosthetic fracture around internal prosthetic left knee joint, subsequent encounter: Secondary | ICD-10-CM | POA: Diagnosis not present

## 2023-08-08 DIAGNOSIS — E119 Type 2 diabetes mellitus without complications: Secondary | ICD-10-CM | POA: Diagnosis not present

## 2023-08-08 DIAGNOSIS — R296 Repeated falls: Secondary | ICD-10-CM | POA: Diagnosis not present

## 2023-08-08 DIAGNOSIS — N390 Urinary tract infection, site not specified: Secondary | ICD-10-CM | POA: Diagnosis not present

## 2023-08-08 DIAGNOSIS — M9711XD Periprosthetic fracture around internal prosthetic right knee joint, subsequent encounter: Secondary | ICD-10-CM | POA: Diagnosis not present

## 2023-08-09 DIAGNOSIS — K5909 Other constipation: Secondary | ICD-10-CM | POA: Diagnosis not present

## 2023-08-09 DIAGNOSIS — M6281 Muscle weakness (generalized): Secondary | ICD-10-CM | POA: Diagnosis not present

## 2023-08-09 DIAGNOSIS — E119 Type 2 diabetes mellitus without complications: Secondary | ICD-10-CM | POA: Diagnosis not present

## 2023-08-09 DIAGNOSIS — R296 Repeated falls: Secondary | ICD-10-CM | POA: Diagnosis not present

## 2023-08-09 DIAGNOSIS — S72092A Other fracture of head and neck of left femur, initial encounter for closed fracture: Secondary | ICD-10-CM | POA: Diagnosis not present

## 2023-08-09 DIAGNOSIS — I10 Essential (primary) hypertension: Secondary | ICD-10-CM | POA: Diagnosis not present

## 2023-08-09 DIAGNOSIS — N390 Urinary tract infection, site not specified: Secondary | ICD-10-CM | POA: Diagnosis not present

## 2023-08-09 DIAGNOSIS — E875 Hyperkalemia: Secondary | ICD-10-CM | POA: Diagnosis not present

## 2023-08-09 DIAGNOSIS — D500 Iron deficiency anemia secondary to blood loss (chronic): Secondary | ICD-10-CM | POA: Diagnosis not present

## 2023-08-11 DIAGNOSIS — S72092A Other fracture of head and neck of left femur, initial encounter for closed fracture: Secondary | ICD-10-CM | POA: Diagnosis not present

## 2023-08-11 DIAGNOSIS — M9712XD Periprosthetic fracture around internal prosthetic left knee joint, subsequent encounter: Secondary | ICD-10-CM | POA: Diagnosis not present

## 2023-08-11 DIAGNOSIS — R296 Repeated falls: Secondary | ICD-10-CM | POA: Diagnosis not present

## 2023-08-11 DIAGNOSIS — Z9181 History of falling: Secondary | ICD-10-CM | POA: Diagnosis not present

## 2023-08-11 DIAGNOSIS — M6281 Muscle weakness (generalized): Secondary | ICD-10-CM | POA: Diagnosis not present

## 2023-08-11 DIAGNOSIS — N390 Urinary tract infection, site not specified: Secondary | ICD-10-CM | POA: Diagnosis not present

## 2023-08-11 DIAGNOSIS — E875 Hyperkalemia: Secondary | ICD-10-CM | POA: Diagnosis not present

## 2023-08-11 DIAGNOSIS — M9711XD Periprosthetic fracture around internal prosthetic right knee joint, subsequent encounter: Secondary | ICD-10-CM | POA: Diagnosis not present

## 2023-08-11 DIAGNOSIS — R2689 Other abnormalities of gait and mobility: Secondary | ICD-10-CM | POA: Diagnosis not present

## 2023-08-11 DIAGNOSIS — I10 Essential (primary) hypertension: Secondary | ICD-10-CM | POA: Diagnosis not present

## 2023-08-11 DIAGNOSIS — D500 Iron deficiency anemia secondary to blood loss (chronic): Secondary | ICD-10-CM | POA: Diagnosis not present

## 2023-08-11 DIAGNOSIS — K5909 Other constipation: Secondary | ICD-10-CM | POA: Diagnosis not present

## 2023-08-11 DIAGNOSIS — R278 Other lack of coordination: Secondary | ICD-10-CM | POA: Diagnosis not present

## 2023-08-11 DIAGNOSIS — E119 Type 2 diabetes mellitus without complications: Secondary | ICD-10-CM | POA: Diagnosis not present

## 2023-08-14 DIAGNOSIS — S72302D Unspecified fracture of shaft of left femur, subsequent encounter for closed fracture with routine healing: Secondary | ICD-10-CM | POA: Diagnosis not present

## 2023-08-14 DIAGNOSIS — E119 Type 2 diabetes mellitus without complications: Secondary | ICD-10-CM | POA: Diagnosis not present

## 2023-08-14 DIAGNOSIS — M9711XD Periprosthetic fracture around internal prosthetic right knee joint, subsequent encounter: Secondary | ICD-10-CM | POA: Diagnosis not present

## 2023-08-14 DIAGNOSIS — S72301D Unspecified fracture of shaft of right femur, subsequent encounter for closed fracture with routine healing: Secondary | ICD-10-CM | POA: Diagnosis not present

## 2023-08-14 DIAGNOSIS — I119 Hypertensive heart disease without heart failure: Secondary | ICD-10-CM | POA: Diagnosis not present

## 2023-08-14 DIAGNOSIS — M9712XD Periprosthetic fracture around internal prosthetic left knee joint, subsequent encounter: Secondary | ICD-10-CM | POA: Diagnosis not present

## 2023-08-21 ENCOUNTER — Telehealth: Payer: Self-pay | Admitting: Family Medicine

## 2023-08-21 DIAGNOSIS — I119 Hypertensive heart disease without heart failure: Secondary | ICD-10-CM | POA: Diagnosis not present

## 2023-08-21 DIAGNOSIS — S72302D Unspecified fracture of shaft of left femur, subsequent encounter for closed fracture with routine healing: Secondary | ICD-10-CM | POA: Diagnosis not present

## 2023-08-21 DIAGNOSIS — M9712XD Periprosthetic fracture around internal prosthetic left knee joint, subsequent encounter: Secondary | ICD-10-CM | POA: Diagnosis not present

## 2023-08-21 DIAGNOSIS — M9711XD Periprosthetic fracture around internal prosthetic right knee joint, subsequent encounter: Secondary | ICD-10-CM | POA: Diagnosis not present

## 2023-08-21 DIAGNOSIS — E119 Type 2 diabetes mellitus without complications: Secondary | ICD-10-CM | POA: Diagnosis not present

## 2023-08-21 DIAGNOSIS — S72301D Unspecified fracture of shaft of right femur, subsequent encounter for closed fracture with routine healing: Secondary | ICD-10-CM | POA: Diagnosis not present

## 2023-08-21 NOTE — Telephone Encounter (Signed)
Noted  KP 

## 2023-08-21 NOTE — Telephone Encounter (Signed)
Copied from CRM [id number]. Topic: General - Inquiry >> Aug 21, 2023 11:18 AM Marlow Baars wrote: Reason for CRM: Lila with [facility] called in stating they have picked up the patient for PT and skilled nursing for wound care. They will be cleaning the sacral area with soap and water and applying the calmoseptine ointment. Then they will just leave open to air. She says this is a stage 2 which was the original staging. Please assist further if necessary. If changes necessary please contact.

## 2023-08-23 DIAGNOSIS — M9711XD Periprosthetic fracture around internal prosthetic right knee joint, subsequent encounter: Secondary | ICD-10-CM | POA: Diagnosis not present

## 2023-08-23 DIAGNOSIS — S72302D Unspecified fracture of shaft of left femur, subsequent encounter for closed fracture with routine healing: Secondary | ICD-10-CM | POA: Diagnosis not present

## 2023-08-23 DIAGNOSIS — I119 Hypertensive heart disease without heart failure: Secondary | ICD-10-CM | POA: Diagnosis not present

## 2023-08-23 DIAGNOSIS — E119 Type 2 diabetes mellitus without complications: Secondary | ICD-10-CM | POA: Diagnosis not present

## 2023-08-23 DIAGNOSIS — M9712XD Periprosthetic fracture around internal prosthetic left knee joint, subsequent encounter: Secondary | ICD-10-CM | POA: Diagnosis not present

## 2023-08-23 DIAGNOSIS — S72301D Unspecified fracture of shaft of right femur, subsequent encounter for closed fracture with routine healing: Secondary | ICD-10-CM | POA: Diagnosis not present

## 2023-08-24 DIAGNOSIS — I119 Hypertensive heart disease without heart failure: Secondary | ICD-10-CM | POA: Diagnosis not present

## 2023-08-24 DIAGNOSIS — E119 Type 2 diabetes mellitus without complications: Secondary | ICD-10-CM | POA: Diagnosis not present

## 2023-08-24 DIAGNOSIS — M9711XD Periprosthetic fracture around internal prosthetic right knee joint, subsequent encounter: Secondary | ICD-10-CM | POA: Diagnosis not present

## 2023-08-24 DIAGNOSIS — M9712XD Periprosthetic fracture around internal prosthetic left knee joint, subsequent encounter: Secondary | ICD-10-CM | POA: Diagnosis not present

## 2023-08-24 DIAGNOSIS — S72301D Unspecified fracture of shaft of right femur, subsequent encounter for closed fracture with routine healing: Secondary | ICD-10-CM | POA: Diagnosis not present

## 2023-08-24 DIAGNOSIS — S72302D Unspecified fracture of shaft of left femur, subsequent encounter for closed fracture with routine healing: Secondary | ICD-10-CM | POA: Diagnosis not present

## 2023-08-28 DIAGNOSIS — M9712XD Periprosthetic fracture around internal prosthetic left knee joint, subsequent encounter: Secondary | ICD-10-CM | POA: Diagnosis not present

## 2023-08-28 DIAGNOSIS — E119 Type 2 diabetes mellitus without complications: Secondary | ICD-10-CM | POA: Diagnosis not present

## 2023-08-28 DIAGNOSIS — S72301D Unspecified fracture of shaft of right femur, subsequent encounter for closed fracture with routine healing: Secondary | ICD-10-CM | POA: Diagnosis not present

## 2023-08-28 DIAGNOSIS — M9711XD Periprosthetic fracture around internal prosthetic right knee joint, subsequent encounter: Secondary | ICD-10-CM | POA: Diagnosis not present

## 2023-08-28 DIAGNOSIS — I119 Hypertensive heart disease without heart failure: Secondary | ICD-10-CM | POA: Diagnosis not present

## 2023-08-28 DIAGNOSIS — S72302D Unspecified fracture of shaft of left femur, subsequent encounter for closed fracture with routine healing: Secondary | ICD-10-CM | POA: Diagnosis not present

## 2023-08-29 DIAGNOSIS — S72302D Unspecified fracture of shaft of left femur, subsequent encounter for closed fracture with routine healing: Secondary | ICD-10-CM | POA: Diagnosis not present

## 2023-08-29 DIAGNOSIS — S72301D Unspecified fracture of shaft of right femur, subsequent encounter for closed fracture with routine healing: Secondary | ICD-10-CM | POA: Diagnosis not present

## 2023-08-29 DIAGNOSIS — E119 Type 2 diabetes mellitus without complications: Secondary | ICD-10-CM | POA: Diagnosis not present

## 2023-08-29 DIAGNOSIS — M9712XD Periprosthetic fracture around internal prosthetic left knee joint, subsequent encounter: Secondary | ICD-10-CM | POA: Diagnosis not present

## 2023-08-29 DIAGNOSIS — I119 Hypertensive heart disease without heart failure: Secondary | ICD-10-CM | POA: Diagnosis not present

## 2023-08-29 DIAGNOSIS — M9711XD Periprosthetic fracture around internal prosthetic right knee joint, subsequent encounter: Secondary | ICD-10-CM | POA: Diagnosis not present

## 2023-08-31 DIAGNOSIS — S72302D Unspecified fracture of shaft of left femur, subsequent encounter for closed fracture with routine healing: Secondary | ICD-10-CM | POA: Diagnosis not present

## 2023-08-31 DIAGNOSIS — M9711XD Periprosthetic fracture around internal prosthetic right knee joint, subsequent encounter: Secondary | ICD-10-CM | POA: Diagnosis not present

## 2023-08-31 DIAGNOSIS — S72301D Unspecified fracture of shaft of right femur, subsequent encounter for closed fracture with routine healing: Secondary | ICD-10-CM | POA: Diagnosis not present

## 2023-08-31 DIAGNOSIS — M9712XD Periprosthetic fracture around internal prosthetic left knee joint, subsequent encounter: Secondary | ICD-10-CM | POA: Diagnosis not present

## 2023-08-31 DIAGNOSIS — I119 Hypertensive heart disease without heart failure: Secondary | ICD-10-CM | POA: Diagnosis not present

## 2023-08-31 DIAGNOSIS — E119 Type 2 diabetes mellitus without complications: Secondary | ICD-10-CM | POA: Diagnosis not present

## 2023-09-01 DIAGNOSIS — M9711XD Periprosthetic fracture around internal prosthetic right knee joint, subsequent encounter: Secondary | ICD-10-CM | POA: Diagnosis not present

## 2023-09-01 DIAGNOSIS — I119 Hypertensive heart disease without heart failure: Secondary | ICD-10-CM | POA: Diagnosis not present

## 2023-09-01 DIAGNOSIS — M9712XD Periprosthetic fracture around internal prosthetic left knee joint, subsequent encounter: Secondary | ICD-10-CM | POA: Diagnosis not present

## 2023-09-01 DIAGNOSIS — E119 Type 2 diabetes mellitus without complications: Secondary | ICD-10-CM | POA: Diagnosis not present

## 2023-09-01 DIAGNOSIS — S72302D Unspecified fracture of shaft of left femur, subsequent encounter for closed fracture with routine healing: Secondary | ICD-10-CM | POA: Diagnosis not present

## 2023-09-01 DIAGNOSIS — S72301D Unspecified fracture of shaft of right femur, subsequent encounter for closed fracture with routine healing: Secondary | ICD-10-CM | POA: Diagnosis not present

## 2023-09-04 DIAGNOSIS — E119 Type 2 diabetes mellitus without complications: Secondary | ICD-10-CM | POA: Diagnosis not present

## 2023-09-04 DIAGNOSIS — S72302D Unspecified fracture of shaft of left femur, subsequent encounter for closed fracture with routine healing: Secondary | ICD-10-CM | POA: Diagnosis not present

## 2023-09-04 DIAGNOSIS — I119 Hypertensive heart disease without heart failure: Secondary | ICD-10-CM | POA: Diagnosis not present

## 2023-09-04 DIAGNOSIS — S72301D Unspecified fracture of shaft of right femur, subsequent encounter for closed fracture with routine healing: Secondary | ICD-10-CM | POA: Diagnosis not present

## 2023-09-04 DIAGNOSIS — M9711XD Periprosthetic fracture around internal prosthetic right knee joint, subsequent encounter: Secondary | ICD-10-CM | POA: Diagnosis not present

## 2023-09-04 DIAGNOSIS — M9712XD Periprosthetic fracture around internal prosthetic left knee joint, subsequent encounter: Secondary | ICD-10-CM | POA: Diagnosis not present

## 2023-09-05 ENCOUNTER — Ambulatory Visit: Payer: Medicare Other | Admitting: Family Medicine

## 2023-09-05 DIAGNOSIS — I119 Hypertensive heart disease without heart failure: Secondary | ICD-10-CM | POA: Diagnosis not present

## 2023-09-05 DIAGNOSIS — S72301D Unspecified fracture of shaft of right femur, subsequent encounter for closed fracture with routine healing: Secondary | ICD-10-CM | POA: Diagnosis not present

## 2023-09-05 DIAGNOSIS — M9712XD Periprosthetic fracture around internal prosthetic left knee joint, subsequent encounter: Secondary | ICD-10-CM | POA: Diagnosis not present

## 2023-09-05 DIAGNOSIS — E119 Type 2 diabetes mellitus without complications: Secondary | ICD-10-CM | POA: Diagnosis not present

## 2023-09-05 DIAGNOSIS — M9711XD Periprosthetic fracture around internal prosthetic right knee joint, subsequent encounter: Secondary | ICD-10-CM | POA: Diagnosis not present

## 2023-09-05 DIAGNOSIS — S72302D Unspecified fracture of shaft of left femur, subsequent encounter for closed fracture with routine healing: Secondary | ICD-10-CM | POA: Diagnosis not present

## 2023-09-06 DIAGNOSIS — M9711XD Periprosthetic fracture around internal prosthetic right knee joint, subsequent encounter: Secondary | ICD-10-CM | POA: Diagnosis not present

## 2023-09-06 DIAGNOSIS — S72301D Unspecified fracture of shaft of right femur, subsequent encounter for closed fracture with routine healing: Secondary | ICD-10-CM | POA: Diagnosis not present

## 2023-09-06 DIAGNOSIS — I119 Hypertensive heart disease without heart failure: Secondary | ICD-10-CM | POA: Diagnosis not present

## 2023-09-06 DIAGNOSIS — E119 Type 2 diabetes mellitus without complications: Secondary | ICD-10-CM | POA: Diagnosis not present

## 2023-09-06 DIAGNOSIS — M9712XD Periprosthetic fracture around internal prosthetic left knee joint, subsequent encounter: Secondary | ICD-10-CM | POA: Diagnosis not present

## 2023-09-06 DIAGNOSIS — S72302D Unspecified fracture of shaft of left femur, subsequent encounter for closed fracture with routine healing: Secondary | ICD-10-CM | POA: Diagnosis not present

## 2023-09-07 DIAGNOSIS — E119 Type 2 diabetes mellitus without complications: Secondary | ICD-10-CM | POA: Diagnosis not present

## 2023-09-07 DIAGNOSIS — S72302D Unspecified fracture of shaft of left femur, subsequent encounter for closed fracture with routine healing: Secondary | ICD-10-CM | POA: Diagnosis not present

## 2023-09-07 DIAGNOSIS — M9711XD Periprosthetic fracture around internal prosthetic right knee joint, subsequent encounter: Secondary | ICD-10-CM | POA: Diagnosis not present

## 2023-09-07 DIAGNOSIS — M9712XD Periprosthetic fracture around internal prosthetic left knee joint, subsequent encounter: Secondary | ICD-10-CM | POA: Diagnosis not present

## 2023-09-07 DIAGNOSIS — S72301D Unspecified fracture of shaft of right femur, subsequent encounter for closed fracture with routine healing: Secondary | ICD-10-CM | POA: Diagnosis not present

## 2023-09-07 DIAGNOSIS — I119 Hypertensive heart disease without heart failure: Secondary | ICD-10-CM | POA: Diagnosis not present

## 2023-09-08 DIAGNOSIS — M9712XD Periprosthetic fracture around internal prosthetic left knee joint, subsequent encounter: Secondary | ICD-10-CM | POA: Diagnosis not present

## 2023-09-08 DIAGNOSIS — M9711XD Periprosthetic fracture around internal prosthetic right knee joint, subsequent encounter: Secondary | ICD-10-CM | POA: Diagnosis not present

## 2023-09-08 DIAGNOSIS — S72302D Unspecified fracture of shaft of left femur, subsequent encounter for closed fracture with routine healing: Secondary | ICD-10-CM | POA: Diagnosis not present

## 2023-09-08 DIAGNOSIS — S72301D Unspecified fracture of shaft of right femur, subsequent encounter for closed fracture with routine healing: Secondary | ICD-10-CM | POA: Diagnosis not present

## 2023-09-08 DIAGNOSIS — E119 Type 2 diabetes mellitus without complications: Secondary | ICD-10-CM | POA: Diagnosis not present

## 2023-09-08 DIAGNOSIS — I119 Hypertensive heart disease without heart failure: Secondary | ICD-10-CM | POA: Diagnosis not present

## 2023-09-12 DIAGNOSIS — S72301D Unspecified fracture of shaft of right femur, subsequent encounter for closed fracture with routine healing: Secondary | ICD-10-CM | POA: Diagnosis not present

## 2023-09-12 DIAGNOSIS — M9711XD Periprosthetic fracture around internal prosthetic right knee joint, subsequent encounter: Secondary | ICD-10-CM | POA: Diagnosis not present

## 2023-09-12 DIAGNOSIS — M9712XD Periprosthetic fracture around internal prosthetic left knee joint, subsequent encounter: Secondary | ICD-10-CM | POA: Diagnosis not present

## 2023-09-12 DIAGNOSIS — S72302D Unspecified fracture of shaft of left femur, subsequent encounter for closed fracture with routine healing: Secondary | ICD-10-CM | POA: Diagnosis not present

## 2023-09-12 DIAGNOSIS — E119 Type 2 diabetes mellitus without complications: Secondary | ICD-10-CM | POA: Diagnosis not present

## 2023-09-12 DIAGNOSIS — I119 Hypertensive heart disease without heart failure: Secondary | ICD-10-CM | POA: Diagnosis not present

## 2023-09-14 DIAGNOSIS — M9711XD Periprosthetic fracture around internal prosthetic right knee joint, subsequent encounter: Secondary | ICD-10-CM | POA: Diagnosis not present

## 2023-09-14 DIAGNOSIS — E119 Type 2 diabetes mellitus without complications: Secondary | ICD-10-CM | POA: Diagnosis not present

## 2023-09-14 DIAGNOSIS — S72302D Unspecified fracture of shaft of left femur, subsequent encounter for closed fracture with routine healing: Secondary | ICD-10-CM | POA: Diagnosis not present

## 2023-09-14 DIAGNOSIS — I119 Hypertensive heart disease without heart failure: Secondary | ICD-10-CM | POA: Diagnosis not present

## 2023-09-14 DIAGNOSIS — S72301D Unspecified fracture of shaft of right femur, subsequent encounter for closed fracture with routine healing: Secondary | ICD-10-CM | POA: Diagnosis not present

## 2023-09-14 DIAGNOSIS — M9712XD Periprosthetic fracture around internal prosthetic left knee joint, subsequent encounter: Secondary | ICD-10-CM | POA: Diagnosis not present

## 2023-09-16 DIAGNOSIS — M9712XD Periprosthetic fracture around internal prosthetic left knee joint, subsequent encounter: Secondary | ICD-10-CM | POA: Diagnosis not present

## 2023-09-16 DIAGNOSIS — I119 Hypertensive heart disease without heart failure: Secondary | ICD-10-CM | POA: Diagnosis not present

## 2023-09-16 DIAGNOSIS — M9711XD Periprosthetic fracture around internal prosthetic right knee joint, subsequent encounter: Secondary | ICD-10-CM | POA: Diagnosis not present

## 2023-09-16 DIAGNOSIS — S72301D Unspecified fracture of shaft of right femur, subsequent encounter for closed fracture with routine healing: Secondary | ICD-10-CM | POA: Diagnosis not present

## 2023-09-16 DIAGNOSIS — E119 Type 2 diabetes mellitus without complications: Secondary | ICD-10-CM | POA: Diagnosis not present

## 2023-09-16 DIAGNOSIS — S72302D Unspecified fracture of shaft of left femur, subsequent encounter for closed fracture with routine healing: Secondary | ICD-10-CM | POA: Diagnosis not present

## 2023-09-19 DIAGNOSIS — S72302D Unspecified fracture of shaft of left femur, subsequent encounter for closed fracture with routine healing: Secondary | ICD-10-CM | POA: Diagnosis not present

## 2023-09-19 DIAGNOSIS — M9712XD Periprosthetic fracture around internal prosthetic left knee joint, subsequent encounter: Secondary | ICD-10-CM | POA: Diagnosis not present

## 2023-09-19 DIAGNOSIS — M9711XD Periprosthetic fracture around internal prosthetic right knee joint, subsequent encounter: Secondary | ICD-10-CM | POA: Diagnosis not present

## 2023-09-19 DIAGNOSIS — E119 Type 2 diabetes mellitus without complications: Secondary | ICD-10-CM | POA: Diagnosis not present

## 2023-09-19 DIAGNOSIS — S72301D Unspecified fracture of shaft of right femur, subsequent encounter for closed fracture with routine healing: Secondary | ICD-10-CM | POA: Diagnosis not present

## 2023-09-19 DIAGNOSIS — I119 Hypertensive heart disease without heart failure: Secondary | ICD-10-CM | POA: Diagnosis not present

## 2023-09-20 DIAGNOSIS — E119 Type 2 diabetes mellitus without complications: Secondary | ICD-10-CM | POA: Diagnosis not present

## 2023-09-20 DIAGNOSIS — M9711XD Periprosthetic fracture around internal prosthetic right knee joint, subsequent encounter: Secondary | ICD-10-CM | POA: Diagnosis not present

## 2023-09-20 DIAGNOSIS — M9712XD Periprosthetic fracture around internal prosthetic left knee joint, subsequent encounter: Secondary | ICD-10-CM | POA: Diagnosis not present

## 2023-09-20 DIAGNOSIS — S72302D Unspecified fracture of shaft of left femur, subsequent encounter for closed fracture with routine healing: Secondary | ICD-10-CM | POA: Diagnosis not present

## 2023-09-20 DIAGNOSIS — S72301D Unspecified fracture of shaft of right femur, subsequent encounter for closed fracture with routine healing: Secondary | ICD-10-CM | POA: Diagnosis not present

## 2023-09-20 DIAGNOSIS — I119 Hypertensive heart disease without heart failure: Secondary | ICD-10-CM | POA: Diagnosis not present

## 2023-09-25 ENCOUNTER — Other Ambulatory Visit: Payer: Self-pay | Admitting: Family Medicine

## 2023-09-25 DIAGNOSIS — G319 Degenerative disease of nervous system, unspecified: Secondary | ICD-10-CM | POA: Diagnosis not present

## 2023-09-25 DIAGNOSIS — J3089 Other allergic rhinitis: Secondary | ICD-10-CM

## 2023-09-25 DIAGNOSIS — I119 Hypertensive heart disease without heart failure: Secondary | ICD-10-CM | POA: Diagnosis not present

## 2023-09-25 DIAGNOSIS — L89312 Pressure ulcer of right buttock, stage 2: Secondary | ICD-10-CM | POA: Diagnosis not present

## 2023-09-25 DIAGNOSIS — D500 Iron deficiency anemia secondary to blood loss (chronic): Secondary | ICD-10-CM | POA: Diagnosis not present

## 2023-09-25 DIAGNOSIS — E119 Type 2 diabetes mellitus without complications: Secondary | ICD-10-CM | POA: Diagnosis not present

## 2023-09-25 DIAGNOSIS — L89322 Pressure ulcer of left buttock, stage 2: Secondary | ICD-10-CM | POA: Diagnosis not present

## 2023-10-05 ENCOUNTER — Ambulatory Visit: Payer: Medicare Other | Admitting: Internal Medicine

## 2023-10-05 DIAGNOSIS — D500 Iron deficiency anemia secondary to blood loss (chronic): Secondary | ICD-10-CM | POA: Diagnosis not present

## 2023-10-05 DIAGNOSIS — L89322 Pressure ulcer of left buttock, stage 2: Secondary | ICD-10-CM | POA: Diagnosis not present

## 2023-10-05 DIAGNOSIS — I119 Hypertensive heart disease without heart failure: Secondary | ICD-10-CM | POA: Diagnosis not present

## 2023-10-05 DIAGNOSIS — G319 Degenerative disease of nervous system, unspecified: Secondary | ICD-10-CM | POA: Diagnosis not present

## 2023-10-05 DIAGNOSIS — E119 Type 2 diabetes mellitus without complications: Secondary | ICD-10-CM | POA: Diagnosis not present

## 2023-10-05 DIAGNOSIS — L89312 Pressure ulcer of right buttock, stage 2: Secondary | ICD-10-CM | POA: Diagnosis not present

## 2023-10-06 ENCOUNTER — Other Ambulatory Visit: Payer: Self-pay | Admitting: Family Medicine

## 2023-10-06 ENCOUNTER — Other Ambulatory Visit: Payer: Self-pay

## 2023-10-06 DIAGNOSIS — E119 Type 2 diabetes mellitus without complications: Secondary | ICD-10-CM

## 2023-10-06 DIAGNOSIS — I119 Hypertensive heart disease without heart failure: Secondary | ICD-10-CM | POA: Diagnosis not present

## 2023-10-06 DIAGNOSIS — K219 Gastro-esophageal reflux disease without esophagitis: Secondary | ICD-10-CM

## 2023-10-06 DIAGNOSIS — L89322 Pressure ulcer of left buttock, stage 2: Secondary | ICD-10-CM | POA: Diagnosis not present

## 2023-10-06 DIAGNOSIS — G319 Degenerative disease of nervous system, unspecified: Secondary | ICD-10-CM | POA: Diagnosis not present

## 2023-10-06 DIAGNOSIS — L89312 Pressure ulcer of right buttock, stage 2: Secondary | ICD-10-CM | POA: Diagnosis not present

## 2023-10-06 DIAGNOSIS — D500 Iron deficiency anemia secondary to blood loss (chronic): Secondary | ICD-10-CM | POA: Diagnosis not present

## 2023-10-06 MED ORDER — ONETOUCH ULTRA BLUE VI STRP: ORAL_STRIP | 2 refills | Status: AC

## 2023-10-06 MED ORDER — OMEPRAZOLE 20 MG PO CPDR
20.0000 mg | DELAYED_RELEASE_CAPSULE | Freq: Every day | ORAL | 1 refills | Status: AC
Start: 2023-10-06 — End: ?

## 2023-10-06 MED ORDER — ACCU-CHEK SOFTCLIX LANCETS MISC
2 refills | Status: DC
Start: 2023-10-06 — End: 2024-01-09

## 2023-10-06 NOTE — Telephone Encounter (Signed)
Patient called and she says she needs the omeprazole, One Touch Ultra test strips and lancets. Advised the lancet request will go to Dr. Yetta Barre because that is not on her current list. She verbalized understanding.   Summary: medication   Patient called requesting a finger prep for her diabetes, she did not know the name.  She is also asking for acid reflux medication she had  before ( she did not know the name ) While we were talking her Occupational Therapist  person came in and she had to get off the phone     Pharmacy: [facility] [address] [place] - [address] Phone: [phone]  Fax: [phone]

## 2023-10-06 NOTE — Telephone Encounter (Signed)
This encounter was created in error - please disregard.

## 2023-10-06 NOTE — Telephone Encounter (Signed)
Requested Prescriptions  Pending Prescriptions Disp Refills   glucose blood (ONE TOUCH ULTRA TEST) test strip 50 each 2    Sig: USE 1 STRIP TO CHECK GLUCOSE ONCE DAILY     Endocrinology: Diabetes - Testing Supplies Passed - 10/06/2023  3:47 PM      Passed - Valid encounter within last 12 months    Recent Outpatient Visits           4 months ago Type 2 diabetes mellitus without complication, without long-term current use of insulin (HCC)   [place] [facility] at [facility], MD   5 months ago Type 2 diabetes mellitus without complication, without long-term current use of insulin (HCC)   [place] [facility], [place]   9 months ago Type 2 diabetes mellitus without complication, without long-term current use of insulin (HCC)   [place] [facility], MD   10 months ago Fever in other diseases   [place] [facility], MD   1 year ago Type 2 diabetes mellitus without complication, without long-term current use of insulin [facility])   [place] [facility] at [facility], MD

## 2023-10-06 NOTE — Telephone Encounter (Signed)
Medication Refill -  Most Recent Primary Care Visit:  Provider: Duanne Limerick  Department: [facility]  Visit Type: OFFICE VISIT  Date: 05/19/2023  Medication: glucose blood (ONE TOUCH ULTRA TEST) test strip    Has the patient contacted their pharmacy?yes    Is this the correct pharmacy for this prescription? Yes  If no, delete pharmacy and type the correct one.  This is the patient's preferred pharmacy:  [facility] [address], [place] - [address] [place] [address] Phone: [phone] Fax: [phone]   Has the prescription been filled recently? No  Is the patient out of the medication? Yes  Has the patient been seen for an appointment in the last year OR does the patient have an upcoming appointment? Yes  Can we respond through MyChart? No  Agent: Please be advised that Rx refills may take up to 3 business days. We ask that you follow-up with your pharmacy.

## 2023-10-06 NOTE — Telephone Encounter (Signed)
Requested Prescriptions  Pending Prescriptions Disp Refills   omeprazole (PRILOSEC) 20 MG capsule 90 capsule 1    Sig: Take 1 capsule (20 mg total) by mouth daily.     Gastroenterology: Proton Pump Inhibitors Passed - 10/06/2023  4:21 PM      Passed - Valid encounter within last 12 months    Recent Outpatient Visits           4 months ago Type 2 diabetes mellitus without complication, without long-term current use of insulin (HCC)   [place] [facility] at [facility], MD   5 months ago Type 2 diabetes mellitus without complication, without long-term current use of insulin (HCC)   [place] [facility] at [facility] MD   9 months ago Type 2 diabetes mellitus without complication, without long-term current use of insulin (HCC)   [place] [facility], MD   10 months ago Fever in other diseases   [place] [facility], MD   1 year ago Type 2 diabetes mellitus without complication, without long-term current use of insulin [facility])   [place] [facility], [place]

## 2023-10-06 NOTE — Telephone Encounter (Signed)
Lancets sent in.  KP

## 2023-10-09 ENCOUNTER — Ambulatory Visit: Payer: Self-pay

## 2023-10-09 NOTE — Telephone Encounter (Signed)
Pt needs an appointment before anything can be sent in.  KP

## 2023-10-09 NOTE — Telephone Encounter (Signed)
Chief Complaint: Leg Pain  Symptoms: bilateral leg pain 5/10, weak knees,  Frequency: comes and goes  Pertinent Negatives: Patient denies fever, redness, new injury  Disposition: [] ED /[] Urgent Care (no appt availability in office) / [] Appointment(In office/virtual)/ []  [place] [facility]/ [] Home Care/ [] Refused Recommended Disposition /[] [place] [facility]/ [x]  Follow-up with PCP Additional Notes: Patient stated she had a fall about 2-3 months ago and hurt both legs. Patient states she has been taking tylenol over the counter since the fall but it is no longer helping control the pain. Patient reports the pain has not been controlled with tylenol for about 2-3 weeks now. Patient also reports weakness in the knees since fall as well. Care advice given and patient was offered an appointment with PCP. Patient declined and stated I just want to see if she would send me something to my pharmacy for pain, it is hard for me to get in and out of my car. Advised I would forward message to PCP for additional recommendations.  Summary: pain   Pt called asking for pain meds for pain when she fell and broke her femur about 3 month ago  CB@  [phone]     Reason for Disposition  [1] MODERATE pain (e.g., interferes with normal activities, limping) AND [2] present > 3 days  Answer Assessment - Initial Assessment Questions 1. ONSET: "When did the pain start?"      About 2-3 weeks ago  2. LOCATION: "Where is the pain located?"      Bilateral legs  3. PAIN: "How bad is the pain?"    (Scale 1-10; or mild, moderate, severe)   -  MILD (1-3): doesn't interfere with normal activities    -  MODERATE (4-7): interferes with normal activities (e.g., work or school) or awakens from sleep, limping    -  SEVERE (8-10): excruciating pain, unable to do any normal activities, unable to walk     5/10 4. WORK OR EXERCISE: "Has there been any recent work or exercise that involved this part of the body?"       No 5. CAUSE: "What do you think is causing the leg pain?"     I fell about 2- 3 months and hurt my legs  6. OTHER SYMPTOMS: "Do you have any other symptoms?" (e.g., chest pain, back pain, breathing difficulty, swelling, rash, fever, numbness, weakness)     Knees are weak  Protocols used: Leg Pain-A-AH

## 2023-10-10 DIAGNOSIS — I119 Hypertensive heart disease without heart failure: Secondary | ICD-10-CM | POA: Diagnosis not present

## 2023-10-10 DIAGNOSIS — D500 Iron deficiency anemia secondary to blood loss (chronic): Secondary | ICD-10-CM | POA: Diagnosis not present

## 2023-10-10 DIAGNOSIS — L89312 Pressure ulcer of right buttock, stage 2: Secondary | ICD-10-CM | POA: Diagnosis not present

## 2023-10-10 DIAGNOSIS — G319 Degenerative disease of nervous system, unspecified: Secondary | ICD-10-CM | POA: Diagnosis not present

## 2023-10-10 DIAGNOSIS — E119 Type 2 diabetes mellitus without complications: Secondary | ICD-10-CM | POA: Diagnosis not present

## 2023-10-10 DIAGNOSIS — L89322 Pressure ulcer of left buttock, stage 2: Secondary | ICD-10-CM | POA: Diagnosis not present

## 2023-10-11 NOTE — Telephone Encounter (Signed)
Called pt spoke to her daughter let her know that she would need to be seen before any medications can be sent in. She told me that she could not transfer the patient. Told her that she could call EMS to get her transfer to the ED. She stated that she will tell the pt to take OTC ain meds. Told her to call back if she needs to schedule an appt. She verbalized understanding.  KP

## 2023-10-20 DIAGNOSIS — L89322 Pressure ulcer of left buttock, stage 2: Secondary | ICD-10-CM | POA: Diagnosis not present

## 2023-10-20 DIAGNOSIS — E119 Type 2 diabetes mellitus without complications: Secondary | ICD-10-CM | POA: Diagnosis not present

## 2023-10-20 DIAGNOSIS — I119 Hypertensive heart disease without heart failure: Secondary | ICD-10-CM | POA: Diagnosis not present

## 2023-10-20 DIAGNOSIS — G319 Degenerative disease of nervous system, unspecified: Secondary | ICD-10-CM | POA: Diagnosis not present

## 2023-10-20 DIAGNOSIS — D500 Iron deficiency anemia secondary to blood loss (chronic): Secondary | ICD-10-CM | POA: Diagnosis not present

## 2023-10-20 DIAGNOSIS — L89312 Pressure ulcer of right buttock, stage 2: Secondary | ICD-10-CM | POA: Diagnosis not present

## 2023-10-24 DIAGNOSIS — E119 Type 2 diabetes mellitus without complications: Secondary | ICD-10-CM | POA: Diagnosis not present

## 2023-10-24 DIAGNOSIS — L89312 Pressure ulcer of right buttock, stage 2: Secondary | ICD-10-CM | POA: Diagnosis not present

## 2023-10-24 DIAGNOSIS — L89322 Pressure ulcer of left buttock, stage 2: Secondary | ICD-10-CM | POA: Diagnosis not present

## 2023-10-24 DIAGNOSIS — I119 Hypertensive heart disease without heart failure: Secondary | ICD-10-CM | POA: Diagnosis not present

## 2023-10-24 DIAGNOSIS — D500 Iron deficiency anemia secondary to blood loss (chronic): Secondary | ICD-10-CM | POA: Diagnosis not present

## 2023-10-24 DIAGNOSIS — G319 Degenerative disease of nervous system, unspecified: Secondary | ICD-10-CM | POA: Diagnosis not present

## 2023-10-26 DIAGNOSIS — D500 Iron deficiency anemia secondary to blood loss (chronic): Secondary | ICD-10-CM | POA: Diagnosis not present

## 2023-10-26 DIAGNOSIS — E119 Type 2 diabetes mellitus without complications: Secondary | ICD-10-CM | POA: Diagnosis not present

## 2023-10-26 DIAGNOSIS — L89322 Pressure ulcer of left buttock, stage 2: Secondary | ICD-10-CM | POA: Diagnosis not present

## 2023-10-26 DIAGNOSIS — I119 Hypertensive heart disease without heart failure: Secondary | ICD-10-CM | POA: Diagnosis not present

## 2023-10-26 DIAGNOSIS — G319 Degenerative disease of nervous system, unspecified: Secondary | ICD-10-CM | POA: Diagnosis not present

## 2023-10-26 DIAGNOSIS — L89312 Pressure ulcer of right buttock, stage 2: Secondary | ICD-10-CM | POA: Diagnosis not present

## 2023-10-30 DIAGNOSIS — L89322 Pressure ulcer of left buttock, stage 2: Secondary | ICD-10-CM | POA: Diagnosis not present

## 2023-10-30 DIAGNOSIS — L89312 Pressure ulcer of right buttock, stage 2: Secondary | ICD-10-CM | POA: Diagnosis not present

## 2023-10-30 DIAGNOSIS — I119 Hypertensive heart disease without heart failure: Secondary | ICD-10-CM | POA: Diagnosis not present

## 2023-10-30 DIAGNOSIS — G319 Degenerative disease of nervous system, unspecified: Secondary | ICD-10-CM | POA: Diagnosis not present

## 2023-10-30 DIAGNOSIS — D500 Iron deficiency anemia secondary to blood loss (chronic): Secondary | ICD-10-CM | POA: Diagnosis not present

## 2023-10-30 DIAGNOSIS — E119 Type 2 diabetes mellitus without complications: Secondary | ICD-10-CM | POA: Diagnosis not present

## 2023-10-31 DIAGNOSIS — G319 Degenerative disease of nervous system, unspecified: Secondary | ICD-10-CM | POA: Diagnosis not present

## 2023-10-31 DIAGNOSIS — E119 Type 2 diabetes mellitus without complications: Secondary | ICD-10-CM | POA: Diagnosis not present

## 2023-10-31 DIAGNOSIS — L89322 Pressure ulcer of left buttock, stage 2: Secondary | ICD-10-CM | POA: Diagnosis not present

## 2023-10-31 DIAGNOSIS — I119 Hypertensive heart disease without heart failure: Secondary | ICD-10-CM | POA: Diagnosis not present

## 2023-10-31 DIAGNOSIS — L89312 Pressure ulcer of right buttock, stage 2: Secondary | ICD-10-CM | POA: Diagnosis not present

## 2023-10-31 DIAGNOSIS — D500 Iron deficiency anemia secondary to blood loss (chronic): Secondary | ICD-10-CM | POA: Diagnosis not present

## 2023-11-02 DIAGNOSIS — G319 Degenerative disease of nervous system, unspecified: Secondary | ICD-10-CM | POA: Diagnosis not present

## 2023-11-02 DIAGNOSIS — D500 Iron deficiency anemia secondary to blood loss (chronic): Secondary | ICD-10-CM | POA: Diagnosis not present

## 2023-11-02 DIAGNOSIS — I119 Hypertensive heart disease without heart failure: Secondary | ICD-10-CM | POA: Diagnosis not present

## 2023-11-02 DIAGNOSIS — E119 Type 2 diabetes mellitus without complications: Secondary | ICD-10-CM | POA: Diagnosis not present

## 2023-11-02 DIAGNOSIS — L89312 Pressure ulcer of right buttock, stage 2: Secondary | ICD-10-CM | POA: Diagnosis not present

## 2023-11-02 DIAGNOSIS — L89322 Pressure ulcer of left buttock, stage 2: Secondary | ICD-10-CM | POA: Diagnosis not present

## 2023-11-07 ENCOUNTER — Other Ambulatory Visit: Payer: Self-pay | Admitting: Family Medicine

## 2023-11-07 DIAGNOSIS — I119 Hypertensive heart disease without heart failure: Secondary | ICD-10-CM | POA: Diagnosis not present

## 2023-11-07 DIAGNOSIS — L89312 Pressure ulcer of right buttock, stage 2: Secondary | ICD-10-CM | POA: Diagnosis not present

## 2023-11-07 DIAGNOSIS — D500 Iron deficiency anemia secondary to blood loss (chronic): Secondary | ICD-10-CM | POA: Diagnosis not present

## 2023-11-07 DIAGNOSIS — L89322 Pressure ulcer of left buttock, stage 2: Secondary | ICD-10-CM | POA: Diagnosis not present

## 2023-11-07 DIAGNOSIS — G319 Degenerative disease of nervous system, unspecified: Secondary | ICD-10-CM | POA: Diagnosis not present

## 2023-11-07 DIAGNOSIS — E119 Type 2 diabetes mellitus without complications: Secondary | ICD-10-CM | POA: Diagnosis not present

## 2023-11-07 NOTE — Telephone Encounter (Signed)
Medication Refill -  Most Recent Primary Care Visit:  Provider: Duanne Limerick  Department: [facility]  Visit Type: OFFICE VISIT  Date: 05/19/2023  Medication: amLODipine (NORVASC) 5 MG tablet   Has the patient contacted their pharmacy? Yes Need to contact pharmacy for refill  Is this the correct pharmacy for this prescription? Yes  This is the patient's preferred pharmacy:  [facility] [address], [place] - [address] [place] [address] Phone: [phone] Fax: [phone]   Has the prescription been filled recently? Yes  Is the patient out of the medication? No  Has the patient been seen for an appointment in the last year OR does the patient have an upcoming appointment? Yes  Can we respond through MyChart? No  Agent: Please be advised that Rx refills may take up to 3 business days. We ask that you follow-up with your pharmacy.  Patient will take last pill on Saturday

## 2023-11-07 NOTE — Telephone Encounter (Signed)
Requested medications are due for refill today.  yes  Requested medications are on the active medications list.  yes  Last refill. 08/02/2023 #30 5 rf - rx was printed  Future visit scheduled.   no  Notes to clinic.  Rx signed by Hartley Barefoot.    Requested Prescriptions  Pending Prescriptions Disp Refills   amLODipine (NORVASC) 5 MG tablet 30 tablet 0    Sig: Take 1 tablet (5 mg total) by mouth daily.     Cardiovascular: Calcium Channel Blockers 2 Passed - 11/07/2023  4:25 PM      Passed - Last BP in normal range    BP Readings from Last 1 Encounters:  08/01/23 123/68         Passed - Last Heart Rate in normal range    Pulse Readings from Last 1 Encounters:  08/01/23 68         Passed - Valid encounter within last 6 months    Recent Outpatient Visits           5 months ago Type 2 diabetes mellitus without complication, without long-term current use of insulin (HCC)   [place] [facility], [place]   6 months ago Type 2 diabetes mellitus without complication, without long-term current use of insulin (HCC)   [place] [facility], MD   10 months ago Type 2 diabetes mellitus without complication, without long-term current use of insulin (HCC)   [place] [facility], MD   11 months ago Fever in other diseases   [place] [facility], MD   1 year ago Type 2 diabetes mellitus without complication, without long-term current use of insulin [facility])   [place] [facility], MD

## 2023-11-08 MED ORDER — AMLODIPINE BESYLATE 5 MG PO TABS: 5.0000 mg | ORAL_TABLET | Freq: Every day | ORAL | 0 refills | Status: DC

## 2023-11-13 ENCOUNTER — Telehealth: Payer: Self-pay | Admitting: Family Medicine

## 2023-11-13 ENCOUNTER — Other Ambulatory Visit: Payer: Self-pay

## 2023-11-13 MED ORDER — AMLODIPINE BESYLATE 5 MG PO TABS: 5.0000 mg | ORAL_TABLET | Freq: Every day | ORAL | 1 refills | Status: DC

## 2023-11-13 NOTE — Telephone Encounter (Signed)
Med refill sent.  - [name]

## 2023-11-13 NOTE — Telephone Encounter (Signed)
Copied from CRM [id number]. Topic: General - Other >> Nov 13, 2023  9:44 AM Franchot Heidelberg wrote: Reason for CRM: Pt called about her Amlodipine, they were approved just never submitted electronically. Class says printed. Please advise, she says she is out.

## 2024-01-01 ENCOUNTER — Telehealth: Payer: Self-pay | Admitting: Family Medicine

## 2024-01-01 ENCOUNTER — Other Ambulatory Visit: Payer: Self-pay | Admitting: Family Medicine

## 2024-01-01 DIAGNOSIS — J3089 Other allergic rhinitis: Secondary | ICD-10-CM

## 2024-01-01 NOTE — Telephone Encounter (Signed)
 Copied from CRM [id number]. Topic: General - Other >> Jan 01, 2024  2:51 PM Bearl Botts E wrote: Reason for CRM: Pt called to report that she is completely out of her current supply of medications that were requested from the pharmacy today.

## 2024-01-01 NOTE — Telephone Encounter (Signed)
 Patient said that she had broke both femurs which does not allow her to move around and said that's why she was not able to come in for her appointments.

## 2024-01-02 NOTE — Telephone Encounter (Signed)
Please review.  KP

## 2024-01-04 ENCOUNTER — Ambulatory Visit: Payer: Self-pay | Admitting: *Deleted

## 2024-01-04 NOTE — Telephone Encounter (Signed)
Pt needs an appt. Can not send in any medications without being seen.  KP

## 2024-01-04 NOTE — Telephone Encounter (Signed)
Patient daughter says its hard to get the patient to her appointments she was given a pill for cough before and just ask for refills. Please advise the daughter [name].

## 2024-01-04 NOTE — Telephone Encounter (Signed)
Message from Bushnell A sent at 01/04/2024 10:24 AM EST  Summary: cough   Aggie Cosier pt daughter states that she has a hacky cough and is wanting to see if PCP will her in some medication for her cough.          Call History  Contact Date/Time Type Contact Phone/Fax By  01/04/2024 10:23 AM EST Phone (Incoming) Harvey,Theresa (Emergency Contact) [phone] (M) Allred, Jasmine Awe   Reason for Disposition  Cough    No other symptoms.   Dry pesky cough  Answer Assessment - Initial Assessment Questions 1. ONSET: "When did the cough begin?"      I returned call to Marton Redwood, daughter,  on [place]. Her mother is wanting something called in for a dry cough.   No sore throat, runny nose, fever.  Please send to [facility] on [address] in [place].   Please call Aggie Cosier back and let her know what Dr. Yetta Barre is agreeable to this or not at [phone].   Thanks. Dr. Yetta Barre had called in a pill for her coughing a long time ago.   She's not able to come in because she can't walk and we can't get her in and out of a car.    She's mostly in the bed.   Dr. Yetta Barre is aware of her situation.   I'm also a pt with Dr. Yetta Barre too.   2. SEVERITY: "How bad is the cough today?"      It's just a dry pesky cough.    She gets this every so often. 3. SPUTUM: "Describe the color of your sputum" (none, dry cough; clear, white, yellow, green)     Non productive.     4. HEMOPTYSIS: "Are you coughing up any blood?" If so ask: "How much?" (flecks, streaks, tablespoons, etc.)     No 5. DIFFICULTY BREATHING: "Are you having difficulty breathing?" If Yes, ask: "How bad is it?" (e.g., mild, moderate, severe)    - MILD: No SOB at rest, mild SOB with walking, speaks normally in sentences, can lie down, no retractions, pulse < 100.    - MODERATE: SOB at rest, SOB with minimal exertion and prefers to sit, cannot lie down flat, speaks in phrases, mild retractions, audible wheezing, pulse 100-120.    - SEVERE: Very SOB at rest,  speaks in single words, struggling to breathe, sitting hunched forward, retractions, pulse > 120      No 6. FEVER: "Do you have a fever?" If Yes, ask: "What is your temperature, how was it measured, and when did it start?"     No 7. CARDIAC HISTORY: "Do you have any history of heart disease?" (e.g., heart attack, congestive heart failure)      Not asked 8. LUNG HISTORY: "Do you have any history of lung disease?"  (e.g., pulmonary embolus, asthma, emphysema)     Not asked 9. PE RISK FACTORS: "Do you have a history of blood clots?" (or: recent major surgery, recent prolonged travel, bedridden)     Not asked 10. OTHER SYMPTOMS: "Do you have any other symptoms?" (e.g., runny nose, wheezing, chest pain)       Denies sore throat, fever, nasal congestion.  "It's just that dry pesky cough she gets sometimes" per daughter Marton Redwood. 11. PREGNANCY: "Is there any chance you are pregnant?" "When was your last menstrual period?"       N/A due to age 32. TRAVEL: "Have you traveled out of the country in the last month?" (e.g., travel history,  exposures)       Not asked  Protocols used: Cough - Acute Non-Productive-A-AH

## 2024-01-04 NOTE — Telephone Encounter (Signed)
  Chief Complaint: Daughter, Marton Redwood (on [place]) requesting medication be called in for her mother's dry pesky cough.   "Dr. Yetta Barre called in a pill for her a long time ago that really helped". Symptoms: Dry, pesky cough she gets occasionally.   I think it's from allergies, maybe. Frequency: Every once in a while she develops this dry pesky cough Pertinent Negatives: Patient denies fever, nasal congestion, sore throat.   No other symptoms.  Not been sick recently.   "Just that pesky dry cough she gets sometimes". Disposition: [] ED /[] Urgent Care (no appt availability in office) / [] Appointment(In office/virtual)/ []  [place] [facility]/ [] Home Care/ [] Refused Recommended Disposition /[] [place] [facility]/ [x]  Follow-up with PCP Additional Notes: Message sent to Dr. Yetta Barre with daughter's request.   Please call Aggie Cosier back at [phone] and let her know Dr. Yetta Barre' decision.    Please send to [facility] on [address] In [place] if she decides to send in something.    Aggie Cosier is a pt of Dr. Yetta Barre also and is aware of her mother's situation of not being able to come into the office).

## 2024-01-05 ENCOUNTER — Other Ambulatory Visit: Payer: Self-pay

## 2024-01-05 DIAGNOSIS — R059 Cough, unspecified: Secondary | ICD-10-CM

## 2024-01-05 MED ORDER — BENZONATATE 100 MG PO CAPS: 100.0000 mg | ORAL_CAPSULE | Freq: Two times a day (BID) | ORAL | 0 refills | Status: AC | PRN

## 2024-01-05 NOTE — Telephone Encounter (Signed)
Sent in [place] as well.  KP

## 2024-01-05 NOTE — Telephone Encounter (Signed)
Called pts daughter let her know to take Mucinex DM OTC. Pt verbalized understanding.  KP

## 2024-01-05 NOTE — Telephone Encounter (Signed)
Pt still needs to be seen or go to UC.  KP

## 2024-01-09 ENCOUNTER — Other Ambulatory Visit: Payer: Self-pay

## 2024-01-09 DIAGNOSIS — E119 Type 2 diabetes mellitus without complications: Secondary | ICD-10-CM

## 2024-01-09 MED ORDER — ACCU-CHEK SOFTCLIX LANCETS MISC: 12 refills | Status: AC

## 2024-08-31 ENCOUNTER — Encounter (HOSPITAL_COMMUNITY): Payer: Self-pay

## 2024-08-31 ENCOUNTER — Emergency Department (HOSPITAL_COMMUNITY): Payer: Medicare (Managed Care)

## 2024-08-31 ENCOUNTER — Emergency Department (HOSPITAL_COMMUNITY)
Admission: EM | Admit: 2024-08-31 | Discharge: 2024-08-31 | Disposition: A | Discharge status: Home / Self Care | Payer: Medicare (Managed Care) | Attending: Emergency Medicine | Admitting: Emergency Medicine

## 2024-08-31 DIAGNOSIS — Z79899 Other long term (current) drug therapy: Secondary | ICD-10-CM | POA: Insufficient documentation

## 2024-08-31 DIAGNOSIS — S40012A Contusion of left shoulder, initial encounter: Secondary | ICD-10-CM | POA: Diagnosis not present

## 2024-08-31 DIAGNOSIS — S8002XA Contusion of left knee, initial encounter: Secondary | ICD-10-CM | POA: Insufficient documentation

## 2024-08-31 DIAGNOSIS — M19012 Primary osteoarthritis, left shoulder: Secondary | ICD-10-CM | POA: Diagnosis not present

## 2024-08-31 DIAGNOSIS — M1712 Unilateral primary osteoarthritis, left knee: Secondary | ICD-10-CM | POA: Diagnosis not present

## 2024-08-31 DIAGNOSIS — W1839XA Other fall on same level, initial encounter: Secondary | ICD-10-CM | POA: Insufficient documentation

## 2024-08-31 DIAGNOSIS — Y92009 Unspecified place in unspecified non-institutional (private) residence as the place of occurrence of the external cause: Secondary | ICD-10-CM | POA: Diagnosis not present

## 2024-08-31 DIAGNOSIS — Z7901 Long term (current) use of anticoagulants: Secondary | ICD-10-CM | POA: Diagnosis not present

## 2024-08-31 DIAGNOSIS — Y9301 Activity, walking, marching and hiking: Secondary | ICD-10-CM | POA: Diagnosis not present

## 2024-08-31 DIAGNOSIS — M25512 Pain in left shoulder: Secondary | ICD-10-CM | POA: Diagnosis present

## 2024-08-31 LAB — URINALYSIS, ROUTINE W REFLEX MICROSCOPIC
Bacteria, UA: NONE SEEN
Bilirubin Urine: NEGATIVE
Glucose, UA: NEGATIVE mg/dL
Hgb urine dipstick: NEGATIVE
Ketones, ur: 5 mg/dL — AB
Nitrite: NEGATIVE
Protein, ur: NEGATIVE mg/dL
Specific Gravity, Urine: 1.013 (ref 1.005–1.030)
pH: 6 (ref 5.0–8.0)

## 2024-08-31 LAB — COMPREHENSIVE METABOLIC PANEL WITH GFR
ALT: 16 U/L (ref 0–44)
AST: 18 U/L (ref 15–41)
Albumin: 3.5 g/dL (ref 3.5–5.0)
Alkaline Phosphatase: 81 U/L (ref 38–126)
Anion gap: 14 (ref 5–15)
BUN: 11 mg/dL (ref 8–23)
CO2: 21 mmol/L — ABNORMAL LOW (ref 22–32)
Calcium: 9.2 mg/dL (ref 8.9–10.3)
Chloride: 102 mmol/L (ref 98–111)
Creatinine, Ser: 0.5 mg/dL (ref 0.44–1.00)
GFR, Estimated: >60 mL/min (ref >60)
Glucose, Bld: 137 mg/dL — ABNORMAL HIGH (ref 70–99)
Potassium: 4.6 mmol/L (ref 3.5–5.1)
Sodium: 137 mmol/L (ref 135–145)
Total Bilirubin: 0.6 mg/dL (ref 0.0–1.2)
Total Protein: 7.8 g/dL (ref 6.5–8.1)

## 2024-08-31 LAB — CBC WITH DIFFERENTIAL/PLATELET
Abs Immature Granulocytes: 0.01 K/uL (ref 0.00–0.07)
Basophils Absolute: 0 K/uL (ref 0.0–0.1)
Basophils Relative: 1 %
Eosinophils Absolute: 0.2 K/uL (ref 0.0–0.5)
Eosinophils Relative: 4 %
HCT: 38 % (ref 36.0–46.0)
Hemoglobin: 12.2 g/dL (ref 12.0–15.0)
Immature Granulocytes: 0 %
Lymphocytes Relative: 33 %
Lymphs Abs: 1.7 K/uL (ref 0.7–4.0)
MCH: 27.9 pg (ref 26.0–34.0)
MCHC: 32.1 g/dL (ref 30.0–36.0)
MCV: 86.8 fL (ref 80.0–100.0)
Monocytes Absolute: 0.4 K/uL (ref 0.1–1.0)
Monocytes Relative: 9 %
Neutro Abs: 2.8 K/uL (ref 1.7–7.7)
Neutrophils Relative %: 53 %
Platelets: 300 K/uL (ref 150–400)
RBC: 4.38 MIL/uL (ref 3.87–5.11)
RDW: 13 % (ref 11.5–15.5)
WBC: 5.1 K/uL (ref 4.0–10.5)
nRBC: 0 % (ref 0.0–0.2)

## 2024-08-31 LAB — CK TOTAL AND CKMB (NOT AT ARMC)
CK, MB: 1 ng/mL (ref 0.5–5.0)
Total CK: 37 U/L — ABNORMAL LOW (ref 38–234)

## 2024-08-31 MED ORDER — IBUPROFEN 600 MG PO TABS: 600.0000 mg | ORAL_TABLET | Freq: Four times a day (QID) | ORAL | 0 refills | Status: AC | PRN

## 2024-08-31 MED ORDER — SODIUM CHLORIDE 0.9% FLUSH: 3.0000 mL | INTRAVENOUS | Status: DC | PRN

## 2024-08-31 MED ORDER — SODIUM CHLORIDE 0.9% FLUSH: 3.0000 mL | Freq: Two times a day (BID) | INTRAVENOUS | Status: DC

## 2024-08-31 MED ORDER — SODIUM CHLORIDE 0.9 % IV SOLN: 250.0000 mL | INTRAVENOUS | Status: DC | PRN

## 2024-08-31 NOTE — ED Provider Notes (Signed)
 [place] [facility] AT [facility] Provider Note   CSN: [id number] Arrival date & time: 08/31/24  1050     Patient presents with: [name] is a 89 y.o. female who presents to the ED via EMS for mechanical fall at home.  She denies any loss of consciousness or head impact, was walking with a walker crossed her bathroom when her legs became weak and she fell onto the floor onto her left side.  She has left shoulder pain, left hip pain, left knee pain.  EMS exam do not show any gross deformities.  No medications given during transportation.    Fall       Prior to Admission medications   Medication Sig Start Date End Date Taking? Authorizing Provider  ibuprofen (ADVIL) 600 MG tablet Take 1 tablet (600 mg total) by mouth every 6 (six) hours as needed. 08/31/24  Yes Myriam Dorn BROCKS, PA  Accu-Chek Softclix Lancets lancets Use as instructed to test Blood Sugar up to twice daily. 01/09/24   Joshua Cathryne BROCKS, MD  amLODipine  (NORVASC ) 5 MG tablet Take 1 tablet by mouth once daily 01/02/24   Jones, Deanna C, MD  apixaban  (ELIQUIS ) 2.5 MG TABS tablet Take 1 tablet (2.5 mg total) by mouth 2 (two) times daily. 07/28/23   Danton Lauraine LABOR, PA-C  benzonatate  (TESSALON ) 100 MG capsule Take 1 capsule (100 mg total) by mouth 2 (two) times daily as needed for cough. 01/05/24   Joshua Cathryne BROCKS, MD  cyanocobalamin  100 MCG tablet Take 100 mcg by mouth daily.    [provider]  docusate sodium  (COLACE) 50 MG capsule Take 1 capsule (50 mg total) by mouth 2 (two) times daily. Patient not taking: Reported on 07/26/2023 01/17/19   Joshua Cathryne BROCKS, MD  ferrous sulfate  325 (65 FE) MG tablet Take 1 tablet (325 mg total) by mouth daily with breakfast. 08/02/23   Regalado, Belkys A, MD  fluocinonide (LIDEX) 0.05 % external solution Apply 1 Application topically See admin instructions. Apply to bilateral legs once a day    [provider]  folic acid  (FOLVITE ) 1 MG  tablet Take 1 tablet (1 mg total) by mouth daily. 08/02/23   Regalado, Owen A, MD  glipiZIDE  (GLUCOTROL  XL) 2.5 MG 24 hr tablet Take 1 tablet (2.5 mg total) by mouth daily with breakfast. 05/09/23   Joshua Cathryne C, MD  glucose blood (ONE TOUCH ULTRA TEST) test strip USE 1 STRIP TO CHECK GLUCOSE ONCE DAILY 10/06/23   Joshua Cathryne BROCKS, MD  latanoprost  (XALATAN ) 0.005 % ophthalmic solution Place 1 drop into both eyes at bedtime.    [provider]  metFORMIN  (GLUCOPHAGE ) 500 MG tablet Take 1 tablet (500 mg total) by mouth 2 (two) times daily with a meal. 05/09/23   Joshua Cathryne BROCKS, MD  methocarbamol  (ROBAXIN ) 500 MG tablet Take 1 tablet (500 mg total) by mouth every 6 (six) hours as needed for muscle spasms. 07/28/23   Danton Lauraine LABOR, PA-C  mometasone  (NASONEX ) 50 MCG/ACT nasal spray Use 2 spray(s) in each nostril once daily Patient taking differently: Place 2 sprays into the nose daily as needed. 01/06/23   Joshua Cathryne BROCKS, MD  montelukast  (SINGULAIR ) 10 MG tablet TAKE 1 TABLET BY MOUTH AT BEDTIME 01/02/24   Joshua Cathryne BROCKS, MD  omeprazole  (PRILOSEC) 20 MG capsule Take 1 capsule (20 mg total) by mouth daily. 10/06/23   Joshua Cathryne BROCKS, MD  oxyCODONE  (OXY IR/ROXICODONE ) 5 MG immediate  release tablet Take 1 tablet (5 mg total) by mouth every 4 (four) hours as needed for moderate pain or severe pain. 07/28/23   Danton Lauraine LABOR, PA-C  polyethylene glycol (MIRALAX  / GLYCOLAX ) 17 g packet Take 17 g by mouth 2 (two) times daily. 08/01/23   Regalado, Belkys A, MD  senna (SENOKOT) 8.6 MG TABS tablet Take 1 tablet (8.6 mg total) by mouth daily. 08/02/23   Regalado, Belkys A, MD  TYLENOL  500 MG tablet Take 500-1,000 mg by mouth every 6 (six) hours as needed (for pain).    [provider]    Allergies: Metoprolol , Nsaids, and Lisinopril     Review of Systems  Musculoskeletal:  Positive for arthralgias.  All other systems reviewed and are negative.   Updated Vital Signs BP (!) 183/82 (BP  Location: Right Arm)   Pulse (!) 58   Temp 98 F (36.7 C) (Oral)   Resp 17   Ht 5' 3 (1.6 m)   Wt 73.9 kg   SpO2 97%   BMI 28.87 kg/m   Physical Exam Vitals and nursing note reviewed.  Constitutional:      General: She is awake. She is not in acute distress.    Appearance: Normal appearance. She is well-developed.  HENT:     Head: Normocephalic and atraumatic.     Mouth/Throat:     Mouth: Mucous membranes are moist.     Pharynx: Oropharynx is clear.  Eyes:     Extraocular Movements: Extraocular movements intact.     Conjunctiva/sclera: Conjunctivae normal.     Pupils: Pupils are equal, round, and reactive to light.  Cardiovascular:     Rate and Rhythm: Normal rate and regular rhythm.     Pulses: Normal pulses.          Radial pulses are 2+ on the right side and 2+ on the left side.       Dorsalis pedis pulses are 2+ on the right side and 2+ on the left side.       Posterior tibial pulses are 2+ on the right side and 2+ on the left side.     Heart sounds: Normal heart sounds. No murmur heard.    No friction rub. No gallop.  Pulmonary:     Effort: Pulmonary effort is normal.     Breath sounds: Normal breath sounds and air entry.  Abdominal:     General: Abdomen is flat. Bowel sounds are normal.     Palpations: Abdomen is soft.  Musculoskeletal:     Right shoulder: Normal.     Left shoulder: Tenderness present. Decreased range of motion.     Cervical back: Full passive range of motion without pain, normal range of motion and neck supple. No spinous process tenderness or muscular tenderness.     Right lower leg: No edema.     Left lower leg: No edema.     Comments: Decreased extension and abduction of the left shoulder  Skin:    General: Skin is warm and dry.     Capillary Refill: Capillary refill takes less than 2 seconds.  Neurological:     General: No focal deficit present.     Mental Status: She is alert and oriented to person, place, and time. Mental status is at  baseline.     GCS: GCS eye subscore is 4. GCS verbal subscore is 5. GCS motor subscore is 6.     Comments: No focal deficits appreciated  Psychiatric:  Mood and Affect: Mood normal.        Behavior: Behavior is cooperative.     (all labs ordered are listed, but only abnormal results are displayed) Labs Reviewed  CK TOTAL AND CKMB (NOT AT [facility]) - Abnormal; Notable for the following components:      Result Value   Total CK 37 (*)    All other components within normal limits  COMPREHENSIVE METABOLIC PANEL WITH GFR - Abnormal; Notable for the following components:   CO2 21 (*)    Glucose, Bld 137 (*)    All other components within normal limits  URINALYSIS, ROUTINE W REFLEX MICROSCOPIC - Abnormal; Notable for the following components:   APPearance CLOUDY (*)    Ketones, ur 5 (*)    Leukocytes,Ua SMALL (*)    All other components within normal limits  CBC WITH DIFFERENTIAL/PLATELET    EKG: None  Radiology: DG Chest 1 View Result Date: 08/31/2024 CLINICAL DATA:  Status post fall. EXAM: CHEST  1 VIEW COMPARISON:  July 26, 2023 FINDINGS: The cardiac silhouette is mildly enlarged and unchanged in size. There is marked severity calcification of the aortic arch with tortuosity of the descending thoracic aorta. Mild, diffuse, chronic appearing increased interstitial lung markings are noted. No focal consolidation, pleural effusion or pneumothorax is seen. Multilevel degenerative changes are present throughout the thoracic spine. IMPRESSION: Chronic appearing increased interstitial lung markings without evidence of acute or active cardiopulmonary disease. Electronically Signed   By: Suzen Dials M.D.   On: 08/31/2024 12:21   DG FEMUR 1V LEFT Result Date: 08/31/2024 CLINICAL DATA:  Status post fall. EXAM: LEFT FEMUR 1 VIEW COMPARISON:  None Available. FINDINGS: There is an intact left knee replacement. A radiopaque intramedullary rod and proximal/distal fixation screws are seen  throughout the length of the left femur. A chronic fracture of the distal left femoral shaft is noted. There is no evidence of an acute fracture or dislocation. Moderate to marked severity vascular calcification is present. IMPRESSION: 1. Intact left knee replacement. 2. Chronic fracture of the distal left femoral shaft. Electronically Signed   By: Suzen Dials M.D.   On: 08/31/2024 12:20   DG Pelvis 1-2 Views Result Date: 08/31/2024 CLINICAL DATA:  Status post fall. EXAM: PELVIS - 1-2 VIEW COMPARISON:  July 26, 2023 FINDINGS: There is no evidence of an acute pelvic fracture or diastasis. No pelvic bone lesions are seen. Radiopaque intramedullary rods are seen within the proximal bilateral femoral shafts. IMPRESSION: No acute osseous abnormality. Electronically Signed   By: Suzen Dials M.D.   On: 08/31/2024 12:18   DG Knee Complete 4 Views Left Result Date: 08/31/2024 CLINICAL DATA:  Status post fall. EXAM: LEFT KNEE - COMPLETE 4+ VIEW COMPARISON:  None Available. FINDINGS: There is an intact left knee replacement. A radiopaque intramedullary rod and distal fixation screws are seen within the visualized portion of the distal left femur. No evidence of an acute fracture or dislocation. A small joint effusion is noted. IMPRESSION: 1. Intact left knee replacement. 2. Prior ORIF of the distal left femur. 3. Small joint effusion. Electronically Signed   By: Suzen Dials M.D.   On: 08/31/2024 12:17   DG Shoulder Left Result Date: 08/31/2024 CLINICAL DATA:  Status post fall. EXAM: LEFT SHOULDER - 2+ VIEW COMPARISON:  July 28, 2023 FINDINGS: There is no evidence of fracture or dislocation. Marked severity degenerative changes are seen involving the left glenohumeral joint. Soft tissues are unremarkable. IMPRESSION: Marked severity degenerative changes involving the  left glenohumeral joint. Electronically Signed   By: Suzen Dials M.D.   On: 08/31/2024 12:15     Procedures    Medications Ordered in the ED  0.9 %  sodium chloride  infusion (has no administration in time range)                                    Medical Decision Making Amount and/or Complexity of Data Reviewed Labs: ordered. Radiology: ordered.  Risk Prescription drug management.   Medical Decision Making:   [name] is a 89 y.o. female who presented to the ED today with mechanical fall at home, left shoulder pain detailed above.    Handoff received from EMS.  Additional history discussed with patient's family/caregivers.  Complete initial physical exam performed, notably the patient  was alert and oriented no apparent distress.  She does have tenderness to the lateral left shoulder, decreased flexion and extension of the same she does have tenderness to palpation at the left hip, also at the left anterior knee..    Reviewed and confirmed nursing documentation for past medical history, family history, social history.    Initial Assessment:   With the patient's presentation of left shoulder and left hip pain, consider acute fracture of the bony structures of the same, further consider muscular injury and/or contusion.  As this was a mechanical fall, find that head imaging is deferred at this time as she had no head impact and has had no loss of consciousness. Initial Plan:  Due to uncertain downtime, evaluate CK and CK-MB. Screening labs including CBC and Metabolic panel to evaluate for infectious or metabolic etiology of disease.  Urinalysis with reflex culture ordered to evaluate for UTI or relevant urologic/nephrologic pathology.  Obtain plain film imaging of the left femur, pelvis, left knee, left shoulder. CXR to evaluate for structural/infectious intrathoracic pathology.  EKG to evaluate for cardiac pathology Objective evaluation as below reviewed   Initial Study Results:   Laboratory  All laboratory results reviewed without evidence of clinically relevant pathology.    Exceptions include: None  EKG EKG was reviewed independently. Rate, rhythm, axis, intervals all examined and without medically relevant abnormality. ST segments without concerns for elevations.    Radiology:  All images reviewed independently. Agree with radiology report at this time.   DG Chest 1 View Result Date: 08/31/2024 CLINICAL DATA:  Status post fall. EXAM: CHEST  1 VIEW COMPARISON:  July 26, 2023 FINDINGS: The cardiac silhouette is mildly enlarged and unchanged in size. There is marked severity calcification of the aortic arch with tortuosity of the descending thoracic aorta. Mild, diffuse, chronic appearing increased interstitial lung markings are noted. No focal consolidation, pleural effusion or pneumothorax is seen. Multilevel degenerative changes are present throughout the thoracic spine. IMPRESSION: Chronic appearing increased interstitial lung markings without evidence of acute or active cardiopulmonary disease. Electronically Signed   By: Suzen Dials M.D.   On: 08/31/2024 12:21   DG FEMUR 1V LEFT Result Date: 08/31/2024 CLINICAL DATA:  Status post fall. EXAM: LEFT FEMUR 1 VIEW COMPARISON:  None Available. FINDINGS: There is an intact left knee replacement. A radiopaque intramedullary rod and proximal/distal fixation screws are seen throughout the length of the left femur. A chronic fracture of the distal left femoral shaft is noted. There is no evidence of an acute fracture or dislocation. Moderate to marked severity vascular calcification is present. IMPRESSION: 1. Intact left knee  replacement. 2. Chronic fracture of the distal left femoral shaft. Electronically Signed   By: Suzen Dials M.D.   On: 08/31/2024 12:20   DG Pelvis 1-2 Views Result Date: 08/31/2024 CLINICAL DATA:  Status post fall. EXAM: PELVIS - 1-2 VIEW COMPARISON:  July 26, 2023 FINDINGS: There is no evidence of an acute pelvic fracture or diastasis. No pelvic bone lesions are seen. Radiopaque  intramedullary rods are seen within the proximal bilateral femoral shafts. IMPRESSION: No acute osseous abnormality. Electronically Signed   By: Suzen Dials M.D.   On: 08/31/2024 12:18   DG Knee Complete 4 Views Left Result Date: 08/31/2024 CLINICAL DATA:  Status post fall. EXAM: LEFT KNEE - COMPLETE 4+ VIEW COMPARISON:  None Available. FINDINGS: There is an intact left knee replacement. A radiopaque intramedullary rod and distal fixation screws are seen within the visualized portion of the distal left femur. No evidence of an acute fracture or dislocation. A small joint effusion is noted. IMPRESSION: 1. Intact left knee replacement. 2. Prior ORIF of the distal left femur. 3. Small joint effusion. Electronically Signed   By: Suzen Dials M.D.   On: 08/31/2024 12:17   DG Shoulder Left Result Date: 08/31/2024 CLINICAL DATA:  Status post fall. EXAM: LEFT SHOULDER - 2+ VIEW COMPARISON:  July 28, 2023 FINDINGS: There is no evidence of fracture or dislocation. Marked severity degenerative changes are seen involving the left glenohumeral joint. Soft tissues are unremarkable. IMPRESSION: Marked severity degenerative changes involving the left glenohumeral joint. Electronically Signed   By: Suzen Dials M.D.   On: 08/31/2024 12:15       Reassessment and Plan:   Imaging findings are reassuring as while there is degenerative changes appreciated and prior chronic injuries and prior repair was, there is not any acute findings suggestive of acute osseous injury.  Lab evaluation is also reassuring as it is not showing any signs of electrolyte abnormality, renal function is normal as is the hepatic function, there is no signs of leukocytosis or hemoconcentration.  Urine results are within normal limits and CK and CK-MB does not show any acute elevations suggestive of rhabdomyolysis.  Given the reassuring findings on physical exam, as well as reassuring imaging and lab evaluation, and vital signs  being stable within normal limits find she is stable for discharge at this time.       Final diagnoses:  Contusion of left shoulder, initial encounter  Contusion of left knee, initial encounter  Primary osteoarthritis of left knee  Primary osteoarthritis of left shoulder    ED Discharge Orders          Ordered    ibuprofen (ADVIL) 600 MG tablet  Every 6 hours PRN        08/31/24 1410               Myriam Dorn BROCKS, [place] 08/31/24 1410    Lenor Hollering, MD 08/31/24 1532

## 2024-08-31 NOTE — ED Triage Notes (Signed)
 Pt BIB [facility] from home d/t a fall. Pt was walking with a walker to the bathroom, then her legs gave up on fell which resulted in a fall. Pt denies loc, hitting her head. Only compliant is L shoulder pain. L arm is able move, no deformities noted. A&O X4.

## 2024-08-31 NOTE — Discharge Instructions (Signed)
 Please follow-up with your primary care in the next week to 2 weeks for continued reassessment.
